# Patient Record
Sex: Male | Born: 1940 | Race: Black or African American | Hispanic: No | Marital: Married | Smoking: Former smoker
Health system: Southern US, Community
[De-identification: ages and names within clinical notes are randomized; demographics above are authoritative.]

## PROBLEM LIST (undated history)

## (undated) DIAGNOSIS — C642 Malignant neoplasm of left kidney, except renal pelvis: Secondary | ICD-10-CM

## (undated) DIAGNOSIS — Z8719 Personal history of other diseases of the digestive system: Secondary | ICD-10-CM

## (undated) DIAGNOSIS — I2510 Atherosclerotic heart disease of native coronary artery without angina pectoris: Secondary | ICD-10-CM

## (undated) DIAGNOSIS — I10 Essential (primary) hypertension: Secondary | ICD-10-CM

## (undated) DIAGNOSIS — K219 Gastro-esophageal reflux disease without esophagitis: Secondary | ICD-10-CM

## (undated) DIAGNOSIS — J449 Chronic obstructive pulmonary disease, unspecified: Secondary | ICD-10-CM

## (undated) DIAGNOSIS — Z95810 Presence of automatic (implantable) cardiac defibrillator: Secondary | ICD-10-CM

## (undated) DIAGNOSIS — N189 Chronic kidney disease, unspecified: Secondary | ICD-10-CM

## (undated) DIAGNOSIS — I82409 Acute embolism and thrombosis of unspecified deep veins of unspecified lower extremity: Secondary | ICD-10-CM

## (undated) DIAGNOSIS — I509 Heart failure, unspecified: Secondary | ICD-10-CM

---

## 1979-08-05 HISTORY — PX: CARDIAC CATHETERIZATION: SHX172

## 1981-12-04 DIAGNOSIS — I82409 Acute embolism and thrombosis of unspecified deep veins of unspecified lower extremity: Secondary | ICD-10-CM

## 1981-12-04 HISTORY — DX: Acute embolism and thrombosis of unspecified deep veins of unspecified lower extremity: I82.409

## 2010-07-21 HISTORY — PX: IMPLANTABLE CARDIOVERTER DEFIBRILLATOR IMPLANT: SHX5860

## 2014-04-09 DIAGNOSIS — N289 Disorder of kidney and ureter, unspecified: Secondary | ICD-10-CM | POA: Insufficient documentation

## 2014-05-04 ENCOUNTER — Other Ambulatory Visit: Payer: Self-pay | Admitting: Urology

## 2014-05-04 DIAGNOSIS — N289 Disorder of kidney and ureter, unspecified: Secondary | ICD-10-CM

## 2014-05-04 HISTORY — PX: RENAL CRYOABLATION: SHX2322

## 2014-05-07 ENCOUNTER — Ambulatory Visit
Admission: RE | Admit: 2014-05-07 | Discharge: 2014-05-07 | Disposition: A | Discharge status: Home / Self Care | Payer: Medicare Other | Source: Ambulatory Visit | Attending: Urology | Admitting: Urology

## 2014-05-07 ENCOUNTER — Encounter (INDEPENDENT_AMBULATORY_CARE_PROVIDER_SITE_OTHER): Payer: Self-pay

## 2014-05-07 DIAGNOSIS — N289 Disorder of kidney and ureter, unspecified: Secondary | ICD-10-CM

## 2014-05-11 ENCOUNTER — Telehealth: Payer: Self-pay | Admitting: Emergency Medicine

## 2014-05-11 NOTE — Telephone Encounter (Signed)
LM W/ AMY AT DR ALI AKBARY OFFICE TO GET CARDIAC CLEARANCE PRIOR TO RENAL CRYOABLATION TO BE PERFORMED AT [facility].  WILL CK AND CALL BACK.

## 2014-06-22 ENCOUNTER — Other Ambulatory Visit: Payer: Self-pay | Admitting: Radiology

## 2014-06-22 DIAGNOSIS — C642 Malignant neoplasm of left kidney, except renal pelvis: Secondary | ICD-10-CM

## 2014-07-02 ENCOUNTER — Other Ambulatory Visit: Payer: Self-pay | Admitting: Emergency Medicine

## 2014-07-02 DIAGNOSIS — C642 Malignant neoplasm of left kidney, except renal pelvis: Secondary | ICD-10-CM

## 2014-07-15 ENCOUNTER — Other Ambulatory Visit: Payer: Self-pay | Admitting: Radiology

## 2014-07-29 DIAGNOSIS — I429 Cardiomyopathy, unspecified: Secondary | ICD-10-CM | POA: Insufficient documentation

## 2014-07-29 DIAGNOSIS — H25819 Combined forms of age-related cataract, unspecified eye: Secondary | ICD-10-CM | POA: Insufficient documentation

## 2014-07-29 DIAGNOSIS — I2510 Atherosclerotic heart disease of native coronary artery without angina pectoris: Secondary | ICD-10-CM | POA: Insufficient documentation

## 2014-07-29 DIAGNOSIS — F528 Other sexual dysfunction not due to a substance or known physiological condition: Secondary | ICD-10-CM | POA: Insufficient documentation

## 2014-07-29 DIAGNOSIS — R9431 Abnormal electrocardiogram [ECG] [EKG]: Secondary | ICD-10-CM | POA: Insufficient documentation

## 2014-07-29 DIAGNOSIS — R350 Frequency of micturition: Secondary | ICD-10-CM | POA: Insufficient documentation

## 2014-07-29 DIAGNOSIS — R102 Pelvic and perineal pain: Secondary | ICD-10-CM | POA: Insufficient documentation

## 2014-07-29 DIAGNOSIS — H179 Unspecified corneal scar and opacity: Secondary | ICD-10-CM | POA: Insufficient documentation

## 2014-07-29 DIAGNOSIS — R12 Heartburn: Secondary | ICD-10-CM | POA: Insufficient documentation

## 2014-08-05 ENCOUNTER — Ambulatory Visit
Admission: RE | Admit: 2014-08-05 | Discharge: 2014-08-05 | Disposition: A | Discharge status: Home / Self Care | Payer: Medicare Other | Source: Ambulatory Visit | Attending: Radiology | Admitting: Radiology

## 2014-08-05 DIAGNOSIS — C642 Malignant neoplasm of left kidney, except renal pelvis: Secondary | ICD-10-CM

## 2014-08-05 NOTE — Progress Notes (Signed)
Denies hematuria or any other problems with urination.  Denies pain associated with procedure.    Has returned to work.  Overall doing well.  Jd Mccaster Riki Rusk, RN 08/05/2014 4:03 PM

## 2014-09-04 DIAGNOSIS — R319 Hematuria, unspecified: Secondary | ICD-10-CM | POA: Insufficient documentation

## 2014-10-23 ENCOUNTER — Emergency Department (HOSPITAL_BASED_OUTPATIENT_CLINIC_OR_DEPARTMENT_OTHER)
Admission: EM | Admit: 2014-10-23 | Discharge: 2014-10-23 | Disposition: A | Discharge status: Other Acute Inpatient Hospital | Payer: Medicare Other | Attending: Emergency Medicine | Admitting: Emergency Medicine

## 2014-10-23 ENCOUNTER — Encounter (HOSPITAL_BASED_OUTPATIENT_CLINIC_OR_DEPARTMENT_OTHER): Payer: Self-pay

## 2014-10-23 ENCOUNTER — Emergency Department (HOSPITAL_BASED_OUTPATIENT_CLINIC_OR_DEPARTMENT_OTHER): Payer: Medicare Other

## 2014-10-23 DIAGNOSIS — R0609 Other forms of dyspnea: Secondary | ICD-10-CM | POA: Diagnosis not present

## 2014-10-23 DIAGNOSIS — N289 Disorder of kidney and ureter, unspecified: Secondary | ICD-10-CM

## 2014-10-23 DIAGNOSIS — Z87891 Personal history of nicotine dependence: Secondary | ICD-10-CM | POA: Diagnosis not present

## 2014-10-23 DIAGNOSIS — D696 Thrombocytopenia, unspecified: Secondary | ICD-10-CM

## 2014-10-23 DIAGNOSIS — I10 Essential (primary) hypertension: Secondary | ICD-10-CM | POA: Diagnosis not present

## 2014-10-23 DIAGNOSIS — R0602 Shortness of breath: Secondary | ICD-10-CM | POA: Diagnosis present

## 2014-10-23 DIAGNOSIS — I509 Heart failure, unspecified: Secondary | ICD-10-CM | POA: Diagnosis not present

## 2014-10-23 DIAGNOSIS — I2510 Atherosclerotic heart disease of native coronary artery without angina pectoris: Secondary | ICD-10-CM | POA: Insufficient documentation

## 2014-10-23 DIAGNOSIS — R079 Chest pain, unspecified: Secondary | ICD-10-CM | POA: Diagnosis not present

## 2014-10-23 DIAGNOSIS — Z95810 Presence of automatic (implantable) cardiac defibrillator: Secondary | ICD-10-CM | POA: Diagnosis not present

## 2014-10-23 HISTORY — DX: Atherosclerotic heart disease of native coronary artery without angina pectoris: I25.10

## 2014-10-23 HISTORY — DX: Essential (primary) hypertension: I10

## 2014-10-23 LAB — CBC WITH DIFFERENTIAL/PLATELET
BASOS ABS: 0 10*3/uL (ref 0.0–0.1)
Basophils Relative: 0 % (ref 0–1)
Eosinophils Absolute: 0.1 10*3/uL (ref 0.0–0.7)
Eosinophils Relative: 1 % (ref 0–5)
HEMATOCRIT: 39.6 % (ref 39.0–52.0)
Hemoglobin: 13.9 g/dL (ref 13.0–17.0)
Lymphocytes Relative: 38 % (ref 12–46)
Lymphs Abs: 2.1 10*3/uL (ref 0.7–4.0)
MCH: 28.3 pg (ref 26.0–34.0)
MCHC: 35.1 g/dL (ref 30.0–36.0)
MCV: 80.5 fL (ref 78.0–100.0)
Monocytes Absolute: 0.5 10*3/uL (ref 0.1–1.0)
Monocytes Relative: 10 % (ref 3–12)
NEUTROS ABS: 2.8 10*3/uL (ref 1.7–7.7)
Neutrophils Relative %: 51 % (ref 43–77)
PLATELETS: 129 10*3/uL — AB (ref 150–400)
RBC: 4.92 MIL/uL (ref 4.22–5.81)
RDW: 14.5 % (ref 11.5–15.5)
WBC: 5.5 10*3/uL (ref 4.0–10.5)

## 2014-10-23 LAB — COMPREHENSIVE METABOLIC PANEL
ALBUMIN: 3.6 g/dL (ref 3.5–5.2)
ALT: 62 U/L — AB (ref 0–53)
AST: 58 U/L — AB (ref 0–37)
Alkaline Phosphatase: 67 U/L (ref 39–117)
Anion gap: 14 (ref 5–15)
BILIRUBIN TOTAL: 0.8 mg/dL (ref 0.3–1.2)
BUN: 28 mg/dL — ABNORMAL HIGH (ref 6–23)
CHLORIDE: 106 meq/L (ref 96–112)
CO2: 23 meq/L (ref 19–32)
Calcium: 9.5 mg/dL (ref 8.4–10.5)
Creatinine, Ser: 1.4 mg/dL — ABNORMAL HIGH (ref 0.50–1.35)
GFR calc Af Amer: 56 mL/min — ABNORMAL LOW (ref >90)
GFR, EST NON AFRICAN AMERICAN: 48 mL/min — AB (ref >90)
Glucose, Bld: 118 mg/dL — ABNORMAL HIGH (ref 70–99)
Potassium: 4.1 mEq/L (ref 3.7–5.3)
SODIUM: 143 meq/L (ref 137–147)
Total Protein: 7 g/dL (ref 6.0–8.3)

## 2014-10-23 LAB — TROPONIN I
Troponin I: <0.3 ng/mL (ref <0.30)
Troponin I: <0.3 ng/mL (ref <0.30)

## 2014-10-23 LAB — PRO B NATRIURETIC PEPTIDE: PRO B NATRI PEPTIDE: 4646 pg/mL — AB (ref 0–125)

## 2014-10-23 MED ORDER — FUROSEMIDE 10 MG/ML IJ SOLN
40.0000 mg | Freq: Once | INTRAMUSCULAR | Status: AC
  Administered 2014-10-23: 40 mg via INTRAVENOUS
  Filled 2014-10-23: qty 4

## 2014-10-23 MED ORDER — ASPIRIN 81 MG PO CHEW
324.0000 mg | CHEWABLE_TABLET | Freq: Once | ORAL | Status: AC
  Administered 2014-10-23: 324 mg via ORAL
  Filled 2014-10-23: qty 4

## 2014-10-23 NOTE — ED Notes (Signed)
HP1 at bedside for pt transport to [facility], pt care transferred to [facility] staff. Pt is awake and alert, denies any c/o.

## 2014-10-23 NOTE — ED Provider Notes (Signed)
Pt awaiting transfer He has been stable He has had significant urine output He is feeling improved   Sharyon Cable, MD 10/23/14 [phone]

## 2014-10-23 NOTE — ED Notes (Signed)
I spoke with Tammy, Nurse Supervisor at [facility], states they have no tele beds at this time will call when one is available. Pt notified of delay, pt resting, no distress noted, pt NSR on monitor.

## 2014-10-23 NOTE — ED Provider Notes (Signed)
CSN: [id number]     Arrival date & time 10/23/14  [id number] History   First MD Initiated Contact with Patient 10/23/14 0245     Chief Complaint  Patient presents with  . Shortness of Breath     Patient is a 73 y.o. male presenting with shortness of breath. The history is provided by the patient.  Shortness of Breath Severity:  Moderate Onset quality:  Gradual Timing:  Intermittent Progression:  Unchanged Chronicity:  New Relieved by:  Rest Worsened by:  Activity Associated symptoms: chest pain and cough   Associated symptoms: no fever, no hemoptysis and no vomiting   Patient reports he woke up tonight feeling SOB He reports he has had these episodes intermittently over the past week He reports increased fatigue and dyspnea on exertion  He reports 2 evaluations in the [facility] over past week.  He reports all testing was normal.  He also has seen his Cardiologist in [facility] this week as well He has an ICD in place and no shocks delivered  He reports very mild CP over past several days.  He does not have any active CP at this time   Past Medical History  Diagnosis Date  . Coronary artery disease   . Hypertension    Past Surgical History  Procedure Laterality Date  . Implantable cardioverter defibrillator implant     No family history on file. History  Substance Use Topics  . Smoking status: Former Smoker -- 0.50 packs/day    Types: Cigarettes    Start date: 05/08/1959    Quit date: 05/07/1976  . Smokeless tobacco: Not on file  . Alcohol Use: No    Review of Systems  Constitutional: Negative for fever.  Respiratory: Positive for cough and shortness of breath. Negative for hemoptysis.   Cardiovascular: Positive for chest pain.  Gastrointestinal: Negative for vomiting and diarrhea.  All other systems reviewed and are negative.     Allergies  Review of patient's allergies indicates no known allergies.  Home Medications   Prior to Admission medications    Not on File   BP 125/76 mmHg  Pulse 80  Temp(Src) 98.4 F (36.9 C) (Oral)  Resp 20  Ht 6' 1.5" (1.867 m)  Wt 222 lb (100.699 kg)  BMI 28.89 kg/m2  SpO2 98% Physical Exam CONSTITUTIONAL: Well developed/well nourished HEAD: Normocephalic/atraumatic EYES: EOMI/PERRL ENMT: Mucous membranes moist NECK: supple no meningeal signs SPINE/BACK:entire spine nontender CV: S1/S2 noted, no murmurs/rubs/gallops noted LUNGS: Lungs are clear to auscultation bilaterally, no apparent distress ABDOMEN: soft, nontender, no rebound or guarding, bowel sounds noted throughout abdomen GU:no cva tenderness NEURO: Pt is awake/alert/appropriate, moves all extremitiesx4.  No facial droop.   EXTREMITIES: pulses normal/equal, full ROM. Minimal pitting edema to bilateral LE SKIN: warm, color normal PSYCH: no abnormalities of mood noted, alert and oriented to situation  ED Course  Procedures   3:03 AM Will need to obtain records from outside facility  Pt currently stable at this time 3:49 AM Records from [facility] received Pt with recent ED visit on 11/15 EKG is unchanged His BNP is elevated  While in the ED, patient ambulated and minimal effort he became tachycardic/tachypneic but no hypoxia His cardiologist is Dr Elvis Coil.  Will call for admission 4:02 AM D/w Dr Atilano Median, physician for Cardiology He agrees that patient should be admitted to history/exam/symptoms Pt agreeable Will admit to [facility] 4:30 AM D/w Dr. Lunette Stands with [facility] Will admit to [facility] regional Labs Review  Labs Reviewed  CBC WITH DIFFERENTIAL - Abnormal; Notable for the following:    Platelets 129 (*)    All other components within normal limits  COMPREHENSIVE METABOLIC PANEL - Abnormal; Notable for the following:    Glucose, Bld 118 (*)    BUN 28 (*)    Creatinine, Ser 1.40 (*)    AST 58 (*)    ALT 62 (*)    GFR calc non Af Amer 48 (*)    GFR calc Af Amer 56 (*)    All other components within  normal limits  PRO B NATRIURETIC PEPTIDE - Abnormal; Notable for the following:    Pro B Natriuretic peptide (BNP) 4646.0 (*)    All other components within normal limits  TROPONIN I    Imaging Review Dg Chest 2 View  10/23/2014   CLINICAL DATA:  Shortness of breath and mid chest pain for 1 week. History of coronary artery disease and hypertension.  EXAM: CHEST  2 VIEW  COMPARISON:  Chest radiograph October 18, 2014  FINDINGS: The cardiac silhouette appears moderate to severely enlarged, unchanged. Mild central pulmonary vascular congestion. Mildly calcified aorta, mediastinal silhouette is nonsuspicious. No pleural effusions or focal consolidations. LEFT cardiac defibrillator in situ. Soft tissue planes and included osseous structures are nonsuspicious, mild degenerative change of the thoracic spine.  IMPRESSION: Stable cardiomegaly mild pulmonary vascular congestion without acute pulmonary process.   Electronically Signed   By: Elon Alas   On: 10/23/2014 03:22     EKG Interpretation   Date/Time:  Friday October 23 2014 02:48:24 EST Ventricular Rate:  74 PR Interval:    QRS Duration: 234 QT Interval:  548 QTC Calculation: 608 R Axis:   -86 Text Interpretation:  Ventricular-paced rhythm Biventricular pacemaker  detected Abnormal ECG No previous ECGs available Confirmed by [place] ([id number]) on 10/23/2014 2:52:30 AM      MDM   Final diagnoses:  Acute congestive heart failure, unspecified congestive heart failure type  Renal insufficiency  Thrombocytopenia  Dyspnea on exertion    Nursing notes including past medical history and social history reviewed and considered in documentation. Labs/vital reviewed myself and considered during evaluation xrays/imaging reviewed by myself and considered during evaluation Previous records reviewed and considered     Sharyon Cable, MD 10/23/14 0430

## 2014-10-23 NOTE — ED Notes (Signed)
Pt c/o SOB, states worse laying down, states has been seen twice for same in the past week with no relief; pt speaking in complete sentences, no distress noted

## 2014-10-23 NOTE — ED Notes (Signed)
Pt on stretcher awaiting transport to [facility] by HP1.

## 2014-10-23 NOTE — ED Notes (Signed)
Pt placed on heart monitor.

## 2014-12-03 ENCOUNTER — Other Ambulatory Visit (HOSPITAL_COMMUNITY): Payer: Self-pay | Admitting: Interventional Radiology

## 2014-12-03 DIAGNOSIS — C642 Malignant neoplasm of left kidney, except renal pelvis: Secondary | ICD-10-CM

## 2014-12-23 ENCOUNTER — Ambulatory Visit
Admission: RE | Admit: 2014-12-23 | Discharge: 2014-12-23 | Disposition: A | Discharge status: Home / Self Care | Payer: Medicare Other | Source: Ambulatory Visit | Attending: Interventional Radiology | Admitting: Interventional Radiology

## 2014-12-23 DIAGNOSIS — C642 Malignant neoplasm of left kidney, except renal pelvis: Secondary | ICD-10-CM

## 2014-12-23 HISTORY — PX: IR GENERIC HISTORICAL: IMG1180011

## 2014-12-23 NOTE — Progress Notes (Signed)
Chief Complaint: Chief Complaint  Patient presents with  . 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 Prior postprocedural CT imaging was performed on 08/03/2014 demonstrating an adequate ablation defect with no residual enhancing carcinoma identified. The patient has been doing well. He states that he did have one episode of fluid overload last November which responded to additional diuretic administration. He has remained active and is still working part time [employer]. He denies any symptoms related to ablation. He has had no pain or urinary symptoms.  Past Medical History  Diagnosis Date  . Coronary artery disease   . Hypertension     Past Surgical History  Procedure Laterality Date  . Implantable cardioverter defibrillator implant      Allergies: Review of patient's allergies indicates no known allergies.  Medications: Prior to Admission medications   Not on File    No family history on file.  History   Social History  . Marital Status: Married    Spouse Name: N/A    Number of Children: N/A  . Years of Education: N/A   Social History Main Topics  . Smoking status: Former Smoker -- 0.50 packs/day    Types: Cigarettes    Start date: 05/08/1959    Quit date: 05/07/1976  . Smokeless tobacco: Not on file  . Alcohol Use: No  . Drug Use: Not on file  . Sexual Activity: Not on file   Other Topics Concern  . Not on file   Social History Narrative    Review of Systems: A 12 point ROS discussed and pertinent positives are indicated in the HPI above.  All other systems are negative.  Review of Systems  Constitutional: Negative.   Respiratory: Negative.   Cardiovascular: Negative.   Gastrointestinal: Negative.   Genitourinary: Negative.   Neurological: Negative.        Vital Signs: BP 109/62 mmHg  Pulse 65  Temp(Src) 98 F (36.7 C) (Oral)  Resp 14  SpO2 100%  Physical Exam  Constitutional: He is oriented to person, place, and time. He appears well-developed and well-nourished. No distress.  Abdominal: Soft. He exhibits no distension. There is no tenderness.  Neurological: He is alert and oriented to person, place, and time.  Skin: He is not diaphoretic.  Nursing note and vitals reviewed.   Imaging: No results found.  Labs:  CBC:  Recent Labs  10/23/14 0250  WBC 5.5  HGB 13.9  HCT 39.6  PLT 129*    COAGS: No results for input(s): INR, APTT in the last 8760 hours.  BMP:  Recent Labs  10/23/14 0250  NA 143  K 4.1  CL 106  CO2 23  GLUCOSE 118*  BUN 28*  CALCIUM 9.5  CREATININE 1.40*  GFRNONAA 48*  GFRAA 56*   12/16/14:  BUN 27, Cr 1.45 and GFR 54 mL/min  LIVER FUNCTION TESTS:  Recent Labs  10/23/14 0250  BILITOT 0.8  AST 58*  ALT 62*  ALKPHOS 67  PROT 7.0  ALBUMIN 3.6    TUMOR MARKERS: No results for input(s): AFPTM, CEA, CA199, CHROMGRNA in the last 8760 hours.  Assessment and Plan:  CT performed today demonstrates further retraction of a cryoablation defect of the posterior mid left kidney with no evidence of residual enhancing tumor. No complication is  evident. There is development of mild renal insufficiency which was not present in August. The last 2 creatinine levels have been around 1.4. BUN has been elevated as well, suggesting some potential prerenal component. Since the patient got contrast today for CT, I recommended that he drink some additional water today. Renal function will be followed. I have recommended further follow-up CT imaging in July, 1 year post ablation. The patient is agreeable.  I spent a total of 20 minutes face to face in clinical consultation, greater than 50% of which was counseling/coordinating care for ablation of a left renal carcinoma.  Venetia Night. Kathlene Cote, M.D. Pager:   [phone]     Signed: [name] T 12/23/2014, 1:00 PM

## 2014-12-23 NOTE — Progress Notes (Signed)
Denies hematuria or any other problems with urination.  Denies pain associated with cryoablation.  To follow up with Dr Nevada Crane in the next few weeks.  Brylinn Teaney Swanton, RN 12/23/2014 11:13 AM

## 2015-01-19 DIAGNOSIS — C649 Malignant neoplasm of unspecified kidney, except renal pelvis: Secondary | ICD-10-CM | POA: Insufficient documentation

## 2015-03-04 ENCOUNTER — Emergency Department (HOSPITAL_BASED_OUTPATIENT_CLINIC_OR_DEPARTMENT_OTHER)
Admission: EM | Admit: 2015-03-04 | Discharge: 2015-03-04 | Disposition: A | Discharge status: Home / Self Care | Payer: Medicare Other | Attending: Emergency Medicine | Admitting: Emergency Medicine

## 2015-03-04 ENCOUNTER — Emergency Department (HOSPITAL_BASED_OUTPATIENT_CLINIC_OR_DEPARTMENT_OTHER): Payer: Medicare Other

## 2015-03-04 ENCOUNTER — Encounter (HOSPITAL_BASED_OUTPATIENT_CLINIC_OR_DEPARTMENT_OTHER): Payer: Self-pay

## 2015-03-04 DIAGNOSIS — I509 Heart failure, unspecified: Secondary | ICD-10-CM | POA: Diagnosis not present

## 2015-03-04 DIAGNOSIS — Z87891 Personal history of nicotine dependence: Secondary | ICD-10-CM | POA: Diagnosis not present

## 2015-03-04 DIAGNOSIS — R05 Cough: Secondary | ICD-10-CM | POA: Insufficient documentation

## 2015-03-04 DIAGNOSIS — Z950 Presence of cardiac pacemaker: Secondary | ICD-10-CM | POA: Insufficient documentation

## 2015-03-04 DIAGNOSIS — Z79899 Other long term (current) drug therapy: Secondary | ICD-10-CM | POA: Diagnosis not present

## 2015-03-04 DIAGNOSIS — R0602 Shortness of breath: Secondary | ICD-10-CM | POA: Diagnosis present

## 2015-03-04 DIAGNOSIS — I10 Essential (primary) hypertension: Secondary | ICD-10-CM | POA: Insufficient documentation

## 2015-03-04 DIAGNOSIS — I2510 Atherosclerotic heart disease of native coronary artery without angina pectoris: Secondary | ICD-10-CM | POA: Insufficient documentation

## 2015-03-04 DIAGNOSIS — Z7901 Long term (current) use of anticoagulants: Secondary | ICD-10-CM | POA: Diagnosis not present

## 2015-03-04 LAB — CBC WITH DIFFERENTIAL/PLATELET
BASOS PCT: 0 % (ref 0–1)
Basophils Absolute: 0 10*3/uL (ref 0.0–0.1)
Eosinophils Absolute: 0 10*3/uL (ref 0.0–0.7)
Eosinophils Relative: 1 % (ref 0–5)
HEMATOCRIT: 39.5 % (ref 39.0–52.0)
HEMOGLOBIN: 13.7 g/dL (ref 13.0–17.0)
Lymphocytes Relative: 37 % (ref 12–46)
Lymphs Abs: 1.5 10*3/uL (ref 0.7–4.0)
MCH: 28.4 pg (ref 26.0–34.0)
MCHC: 34.7 g/dL (ref 30.0–36.0)
MCV: 81.8 fL (ref 78.0–100.0)
MONOS PCT: 13 % — AB (ref 3–12)
Monocytes Absolute: 0.5 10*3/uL (ref 0.1–1.0)
NEUTROS PCT: 49 % (ref 43–77)
Neutro Abs: 2 10*3/uL (ref 1.7–7.7)
Platelets: 135 10*3/uL — ABNORMAL LOW (ref 150–400)
RBC: 4.83 MIL/uL (ref 4.22–5.81)
RDW: 15.5 % (ref 11.5–15.5)
WBC: 4.1 10*3/uL (ref 4.0–10.5)

## 2015-03-04 LAB — COMPREHENSIVE METABOLIC PANEL
ALK PHOS: 61 U/L (ref 39–117)
ALT: 40 U/L (ref 0–53)
ANION GAP: 6 (ref 5–15)
AST: 50 U/L — ABNORMAL HIGH (ref 0–37)
Albumin: 3.7 g/dL (ref 3.5–5.2)
BUN: 29 mg/dL — ABNORMAL HIGH (ref 6–23)
CO2: 24 mmol/L (ref 19–32)
Calcium: 8.8 mg/dL (ref 8.4–10.5)
Chloride: 111 mmol/L (ref 96–112)
Creatinine, Ser: 1.3 mg/dL (ref 0.50–1.35)
GFR calc Af Amer: 61 mL/min — ABNORMAL LOW (ref >90)
GFR calc non Af Amer: 53 mL/min — ABNORMAL LOW (ref >90)
GLUCOSE: 133 mg/dL — AB (ref 70–99)
Potassium: 4.1 mmol/L (ref 3.5–5.1)
Sodium: 141 mmol/L (ref 135–145)
Total Bilirubin: 0.6 mg/dL (ref 0.3–1.2)
Total Protein: 6.5 g/dL (ref 6.0–8.3)

## 2015-03-04 LAB — TROPONIN I
Troponin I: 0.06 ng/mL — ABNORMAL HIGH (ref <0.031)
Troponin I: 0.09 ng/mL — ABNORMAL HIGH (ref <0.031)

## 2015-03-04 LAB — BRAIN NATRIURETIC PEPTIDE: B Natriuretic Peptide: 1490.3 pg/mL — ABNORMAL HIGH (ref 0.0–100.0)

## 2015-03-04 MED ORDER — FUROSEMIDE 10 MG/ML IJ SOLN
40.0000 mg | Freq: Once | INTRAMUSCULAR | Status: AC
  Administered 2015-03-04: 40 mg via INTRAVENOUS
  Filled 2015-03-04: qty 4

## 2015-03-04 NOTE — ED Notes (Signed)
Pt reports SHOB with exertion x 2-3 days. Reports cough, dry and non-productive

## 2015-03-04 NOTE — ED Notes (Signed)
Pt denies sob or pain at present

## 2015-03-04 NOTE — ED Provider Notes (Signed)
CSN: [id number]     Arrival date & time 03/04/15  1826 History  This chart was scribed for Spencer Speak, MD by Tula Nakayama, ED Scribe. This patient was seen in room MH06/MH06 and the patient's care was started at 6:43 PM.    Chief Complaint  Patient presents with  . Shortness of Breath   Patient is a 74 y.o. male presenting with shortness of breath. The history is provided by the patient. No language interpreter was used.  Shortness of Breath Severity:  Mild Onset quality:  Gradual Duration:  3 days Timing:  Intermittent Progression:  Unchanged Chronicity:  New Context: activity   Relieved by:  Rest Worsened by:  Exertion Ineffective treatments:  Diuretics Associated symptoms: cough   Associated symptoms: no chest pain     HPI Comments: Daric Paslay is a 74 y.o. male with a history of CAD and HTN, who presents to the Emergency Department complaining of intermittent, mild SOB that occurs with exertion and started 2-3 days ago. Pt states mild, dry and non-productive cough as an associated symptom. He reports SOB improves with rest. Pt had a pacemaker placed in 2011. He was seen in the ED in 10/2014 for SOB and was diagnosed with acute CHF. He denies a history of kidney issues. Pt also denies CP and leg swelling as an associated symptoms.  Past Medical History  Diagnosis Date  . Coronary artery disease   . Hypertension    Past Surgical History  Procedure Laterality Date  . Implantable cardioverter defibrillator implant     No family history on file. History  Substance Use Topics  . Smoking status: Former Smoker -- 0.50 packs/day    Types: Cigarettes    Start date: 05/08/1959    Quit date: 05/07/1976  . Smokeless tobacco: Not on file  . Alcohol Use: No    Review of Systems  Respiratory: Positive for cough and shortness of breath.   Cardiovascular: Negative for chest pain.  All other systems reviewed and are negative.   Allergies  Review of patient's allergies  indicates no known allergies.  Home Medications   Prior to Admission medications   Medication Sig Start Date End Date Taking? Authorizing Provider  diltiazem (CARDIZEM) 30 MG tablet Take 30 mg by mouth 4 (four) times daily.   Yes Historical Provider, MD  furosemide (LASIX) 20 MG tablet Take 20 mg by mouth.   Yes Historical Provider, MD  warfarin (COUMADIN) 5 MG tablet Take 5 mg by mouth daily.   Yes Historical Provider, MD   BP 102/62 mmHg  Pulse 74  Temp(Src) 98.1 F (36.7 C) (Oral)  Resp 18  Ht 6\' 1"  (1.854 m)  Wt 218 lb (98.884 kg)  BMI 28.77 kg/m2  SpO2 97% Physical Exam  Constitutional: He appears well-developed and well-nourished. No distress.  HENT:  Head: Normocephalic and atraumatic.  Eyes: Conjunctivae and EOM are normal.  Neck: Neck supple. No tracheal deviation present.  Cardiovascular: Normal rate and regular rhythm.   Pulmonary/Chest: Effort normal. No respiratory distress. He has rales.  Slight rales at the bases bilaterally  Skin: Skin is warm and dry.  Psychiatric: He has a normal mood and affect. His behavior is normal.  Nursing note and vitals reviewed.   ED Course  Procedures   DIAGNOSTIC STUDIES: Oxygen Saturation is 97% on RA, normal by my interpretation.    COORDINATION OF CARE: 6:50 PM Discussed treatment plan with pt at bedside and pt agreed to plan.    Labs Review  Labs Reviewed - No data to display  Imaging Review No results found.   EKG Interpretation   Date/Time:  Thursday March 04 2015 18:45:28 EDT Ventricular Rate:  77 PR Interval:  140 QRS Duration: 152 QT Interval:  468 QTC Calculation: 529 R Axis:   -82 Text Interpretation:  Atrial-sensed ventricular-paced rhythm with  occasional Premature ventricular complexes Abnormal ECG Confirmed by DELOS   MD, Fain Francis ([id number]) on 03/04/2015 6:53:22 PM      MDM   Final diagnoses:  None    Patient with history of congestive heart failure and pacemaker placement. He presents  today with complaints of difficulty breathing. He has had exacerbations of CHF in the past which have been treated with Lasix. He believes that is what is happening again.  He does have crackles on exam and is found to have an elevated BNP. His chest x-ray shows cardiomegaly without overt pulmonary edema. He was given IV Lasix with good diuresis and is feeling much improved. During the course of his workup he was found to have a troponin of 0.06. I am uncertain as to the exact significance of this, however feel it is likely related to congestive heart failure rather than an acute coronary event. His repeat troponin is 0.09. I've discussed these findings with Dr. Philbert Riser from cardiology who does not feel as though this is a significant increase and agrees that his presentation is most consistent with congestive heart failure. He will be discharged to home with an increased dose of Lasix for the next 3 days and advised to return to the ER if he develops worsening breathing, chest pain, or other problems.  I personally performed the services described in this documentation, which was scribed in my presence. The recorded information has been reviewed and is accurate.      Spencer Speak, MD 03/04/15 [phone]

## 2015-03-04 NOTE — ED Notes (Signed)
MD at bedside. 

## 2015-03-04 NOTE — Discharge Instructions (Signed)
Increase your Lasix to 20 mg twice daily for the next 3 days, then resume 20 mg daily dose as before.  Return to the emergency department if you develop chest pains, worsening breathing, or other new and concerning symptoms.   Heart Failure Heart failure is a condition in which the heart has trouble pumping blood. This means your heart does not pump blood efficiently for your body to work well. In some cases of heart failure, fluid may back up into your lungs or you may have swelling (edema) in your lower legs. Heart failure is usually a long-term (chronic) condition. It is important for you to take good care of yourself and follow your health care provider's treatment plan. CAUSES  Some health conditions can cause heart failure. Those health conditions include:  High blood pressure (hypertension). Hypertension causes the heart muscle to work harder than normal. When pressure in the blood vessels is high, the heart needs to pump (contract) with more force in order to circulate blood throughout the body. High blood pressure eventually causes the heart to become stiff and weak.  Coronary artery disease (CAD). CAD is the buildup of cholesterol and fat (plaque) in the arteries of the heart. The blockage in the arteries deprives the heart muscle of oxygen and blood. This can cause chest pain and may lead to a heart attack. High blood pressure can also contribute to CAD.  Heart attack (myocardial infarction). A heart attack occurs when one or more arteries in the heart become blocked. The loss of oxygen damages the muscle tissue of the heart. When this happens, part of the heart muscle dies. The injured tissue does not contract as well and weakens the heart's ability to pump blood.  Abnormal heart valves. When the heart valves do not open and close properly, it can cause heart failure. This makes the heart muscle pump harder to keep the blood flowing.  Heart muscle disease (cardiomyopathy or  myocarditis). Heart muscle disease is damage to the heart muscle from a variety of causes. These can include drug or alcohol abuse, infections, or unknown reasons. These can increase the risk of heart failure.  Lung disease. Lung disease makes the heart work harder because the lungs do not work properly. This can cause a strain on the heart, leading it to fail.  Diabetes. Diabetes increases the risk of heart failure. High blood sugar contributes to high fat (lipid) levels in the blood. Diabetes can also cause slow damage to tiny blood vessels that carry important nutrients to the heart muscle. When the heart does not get enough oxygen and food, it can cause the heart to become weak and stiff. This leads to a heart that does not contract efficiently.  Other conditions can contribute to heart failure. These include abnormal heart rhythms, thyroid problems, and low blood counts (anemia). Certain unhealthy behaviors can increase the risk of heart failure, including:  Being overweight.  Smoking or chewing tobacco.  Eating foods high in fat and cholesterol.  Abusing illicit drugs or alcohol.  Lacking physical activity. SYMPTOMS  Heart failure symptoms may vary and can be hard to detect. Symptoms may include:  Shortness of breath with activity, such as climbing stairs.  Persistent cough.  Swelling of the feet, ankles, legs, or abdomen.  Unexplained weight gain.  Difficulty breathing when lying flat (orthopnea).  Waking from sleep because of the need to sit up and get more air.  Rapid heartbeat.  Fatigue and loss of energy.  Feeling light-headed, dizzy, or  close to fainting.  Loss of appetite.  Nausea.  Increased urination during the night (nocturia). DIAGNOSIS  A diagnosis of heart failure is based on your history, symptoms, physical examination, and diagnostic tests. Diagnostic tests for heart failure may include:  Echocardiography.  Electrocardiography.  Chest  X-ray.  Blood tests.  Exercise stress test.  Cardiac angiography.  Radionuclide scans. TREATMENT  Treatment is aimed at managing the symptoms of heart failure. Medicines, behavioral changes, or surgical intervention may be necessary to treat heart failure.  Medicines to help treat heart failure may include:  Angiotensin-converting enzyme (ACE) inhibitors. This type of medicine blocks the effects of a blood protein called angiotensin-converting enzyme. ACE inhibitors relax (dilate) the blood vessels and help lower blood pressure.  Angiotensin receptor blockers (ARBs). This type of medicine blocks the actions of a blood protein called angiotensin. Angiotensin receptor blockers dilate the blood vessels and help lower blood pressure.  Water pills (diuretics). Diuretics cause the kidneys to remove salt and water from the blood. The extra fluid is removed through urination. This loss of extra fluid lowers the volume of blood the heart pumps.  Beta blockers. These prevent the heart from beating too fast and improve heart muscle strength.  Digitalis. This increases the force of the heartbeat.  Healthy behavior changes include:  Obtaining and maintaining a healthy weight.  Stopping smoking or chewing tobacco.  Eating heart-healthy foods.  Limiting or avoiding alcohol.  Stopping illicit drug use.  Physical activity as directed by your health care provider.  Surgical treatment for heart failure may include:  A procedure to open blocked arteries, repair damaged heart valves, or remove damaged heart muscle tissue.  A pacemaker to improve heart muscle function and control certain abnormal heart rhythms.  An internal cardioverter defibrillator to treat certain serious abnormal heart rhythms.  A left ventricular assist device (LVAD) to assist the pumping ability of the heart. HOME CARE INSTRUCTIONS   Take medicines only as directed by your health care provider. Medicines are  important in reducing the workload of your heart, slowing the progression of heart failure, and improving your symptoms.  Do not stop taking your medicine unless directed by your health care provider.  Do not skip any dose of medicine.  Refill your prescriptions before you run out of medicine. Your medicines are needed every day.  Engage in moderate physical activity if directed by your health care provider. Moderate physical activity can benefit some people. The elderly and people with severe heart failure should consult with a health care provider for physical activity recommendations.  Eat heart-healthy foods. Food choices should be free of trans fat and low in saturated fat, cholesterol, and salt (sodium). Healthy choices include fresh or frozen fruits and vegetables, fish, lean meats, legumes, fat-free or low-fat dairy products, and whole grain or high fiber foods. Talk to a dietitian to learn more about heart-healthy foods.  Limit sodium if directed by your health care provider. Sodium restriction may reduce symptoms of heart failure in some people. Talk to a dietitian to learn more about heart-healthy seasonings.  Use healthy cooking methods. Healthy cooking methods include roasting, grilling, broiling, baking, poaching, steaming, or stir-frying. Talk to a dietitian to learn more about healthy cooking methods.  Limit fluids if directed by your health care provider. Fluid restriction may reduce symptoms of heart failure in some people.  Weigh yourself every day. Daily weights are important in the early recognition of excess fluid. You should weigh yourself every morning after you urinate  and before you eat breakfast. Wear the same amount of clothing each time you weigh yourself. Record your daily weight. Provide your health care provider with your weight record.  Monitor and record your blood pressure if directed by your health care provider.  Check your pulse if directed by your health  care provider.  Lose weight if directed by your health care provider. Weight loss may reduce symptoms of heart failure in some people.  Stop smoking or chewing tobacco. Nicotine makes your heart work harder by causing your blood vessels to constrict. Do not use nicotine gum or patches before talking to your health care provider.  Keep all follow-up visits as directed by your health care provider. This is important.  Limit alcohol intake to no more than 1 drink per day for nonpregnant women and 2 drinks per day for men. One drink equals 12 ounces of beer, 5 ounces of wine, or 1 ounces of hard liquor. Drinking more than that is harmful to your heart. Tell your health care provider if you drink alcohol several times a week. Talk with your health care provider about whether alcohol is safe for you. If your heart has already been damaged by alcohol or you have severe heart failure, drinking alcohol should be stopped completely.  Stop illicit drug use.  Stay up-to-date with immunizations. It is especially important to prevent respiratory infections through current pneumococcal and influenza immunizations.  Manage other health conditions such as hypertension, diabetes, thyroid disease, or abnormal heart rhythms as directed by your health care provider.  Learn to manage stress.  Plan rest periods when fatigued.  Learn strategies to manage high temperatures. If the weather is extremely hot:  Avoid vigorous physical activity.  Use air conditioning or fans or seek a cooler location.  Avoid caffeine and alcohol.  Wear loose-fitting, lightweight, and light-colored clothing.  Learn strategies to manage cold temperatures. If the weather is extremely cold:  Avoid vigorous physical activity.  Layer clothes.  Wear mittens or gloves, a hat, and a scarf when going outside.  Avoid alcohol.  Obtain ongoing education and support as needed.  Participate in or seek rehabilitation as needed to  maintain or improve independence and quality of life. SEEK MEDICAL CARE IF:   Your weight increases by 03 lb/1.4 kg in 1 day or 05 lb/2.3 kg in a week.  You have increasing shortness of breath that is unusual for you.  You are unable to participate in your usual physical activities.  You tire easily.  You cough more than normal, especially with physical activity.  You have any or more swelling in areas such as your hands, feet, ankles, or abdomen.  You are unable to sleep because it is hard to breathe.  You feel like your heart is beating fast (palpitations).  You become dizzy or light-headed upon standing up. SEEK IMMEDIATE MEDICAL CARE IF:   You have difficulty breathing.  There is a change in mental status such as decreased alertness or difficulty with concentration.  You have a pain or discomfort in your chest.  You have an episode of fainting (syncope). MAKE SURE YOU:   Understand these instructions.  Will watch your condition.  Will get help right away if you are not doing well or get worse. Document Released: 11/20/2005 Document Revised: 04/06/2014 Document Reviewed: 12/20/2012 [facility] Patient Information 2015 [place], [place]. This information is not intended to replace advice given to you by your health care provider. Make sure you discuss any questions you have  with your health care provider. ° °

## 2015-03-04 NOTE — ED Notes (Signed)
Placed on pulse ox and cardiac monitoring.

## 2015-03-21 ENCOUNTER — Emergency Department (HOSPITAL_BASED_OUTPATIENT_CLINIC_OR_DEPARTMENT_OTHER)
Admission: EM | Admit: 2015-03-21 | Discharge: 2015-03-21 | Disposition: A | Discharge status: Home / Self Care | Payer: Medicare Other | Attending: Emergency Medicine | Admitting: Emergency Medicine

## 2015-03-21 ENCOUNTER — Emergency Department (HOSPITAL_BASED_OUTPATIENT_CLINIC_OR_DEPARTMENT_OTHER): Payer: Medicare Other

## 2015-03-21 ENCOUNTER — Encounter (HOSPITAL_BASED_OUTPATIENT_CLINIC_OR_DEPARTMENT_OTHER): Payer: Self-pay | Admitting: *Deleted

## 2015-03-21 DIAGNOSIS — Z87891 Personal history of nicotine dependence: Secondary | ICD-10-CM | POA: Insufficient documentation

## 2015-03-21 DIAGNOSIS — I2510 Atherosclerotic heart disease of native coronary artery without angina pectoris: Secondary | ICD-10-CM | POA: Diagnosis not present

## 2015-03-21 DIAGNOSIS — R0602 Shortness of breath: Secondary | ICD-10-CM | POA: Insufficient documentation

## 2015-03-21 DIAGNOSIS — Z79899 Other long term (current) drug therapy: Secondary | ICD-10-CM | POA: Insufficient documentation

## 2015-03-21 DIAGNOSIS — R059 Cough, unspecified: Secondary | ICD-10-CM

## 2015-03-21 DIAGNOSIS — I10 Essential (primary) hypertension: Secondary | ICD-10-CM | POA: Diagnosis not present

## 2015-03-21 DIAGNOSIS — R05 Cough: Secondary | ICD-10-CM | POA: Diagnosis not present

## 2015-03-21 DIAGNOSIS — Z7982 Long term (current) use of aspirin: Secondary | ICD-10-CM | POA: Diagnosis not present

## 2015-03-21 DIAGNOSIS — Z7901 Long term (current) use of anticoagulants: Secondary | ICD-10-CM | POA: Diagnosis not present

## 2015-03-21 LAB — COMPREHENSIVE METABOLIC PANEL
ALBUMIN: 3.7 g/dL (ref 3.5–5.2)
ALK PHOS: 65 U/L (ref 39–117)
ALT: 41 U/L (ref 0–53)
AST: 62 U/L — AB (ref 0–37)
Anion gap: 7 (ref 5–15)
BILIRUBIN TOTAL: 1.1 mg/dL (ref 0.3–1.2)
BUN: 26 mg/dL — ABNORMAL HIGH (ref 6–23)
CHLORIDE: 107 mmol/L (ref 96–112)
CO2: 25 mmol/L (ref 19–32)
CREATININE: 1.38 mg/dL — AB (ref 0.50–1.35)
Calcium: 8.7 mg/dL (ref 8.4–10.5)
GFR calc Af Amer: 57 mL/min — ABNORMAL LOW (ref >90)
GFR calc non Af Amer: 49 mL/min — ABNORMAL LOW (ref >90)
Glucose, Bld: 164 mg/dL — ABNORMAL HIGH (ref 70–99)
POTASSIUM: 5 mmol/L (ref 3.5–5.1)
Sodium: 139 mmol/L (ref 135–145)
Total Protein: 6.6 g/dL (ref 6.0–8.3)

## 2015-03-21 LAB — CBC WITH DIFFERENTIAL/PLATELET
Basophils Absolute: 0 10*3/uL (ref 0.0–0.1)
Basophils Relative: 0 % (ref 0–1)
EOS ABS: 0.1 10*3/uL (ref 0.0–0.7)
EOS PCT: 1 % (ref 0–5)
HCT: 40.8 % (ref 39.0–52.0)
HEMOGLOBIN: 13.7 g/dL (ref 13.0–17.0)
LYMPHS ABS: 1 10*3/uL (ref 0.7–4.0)
Lymphocytes Relative: 26 % (ref 12–46)
MCH: 28 pg (ref 26.0–34.0)
MCHC: 33.6 g/dL (ref 30.0–36.0)
MCV: 83.3 fL (ref 78.0–100.0)
MONOS PCT: 12 % (ref 3–12)
Monocytes Absolute: 0.5 10*3/uL (ref 0.1–1.0)
NEUTROS ABS: 2.3 10*3/uL (ref 1.7–7.7)
NEUTROS PCT: 60 % (ref 43–77)
PLATELETS: 150 10*3/uL (ref 150–400)
RBC: 4.9 MIL/uL (ref 4.22–5.81)
RDW: 15.2 % (ref 11.5–15.5)
WBC: 3.8 10*3/uL — ABNORMAL LOW (ref 4.0–10.5)

## 2015-03-21 LAB — BRAIN NATRIURETIC PEPTIDE: B NATRIURETIC PEPTIDE 5: 1099.8 pg/mL — AB (ref 0.0–100.0)

## 2015-03-21 LAB — PROTIME-INR
INR: 2.82 — ABNORMAL HIGH (ref 0.00–1.49)
PROTHROMBIN TIME: 29.7 s — AB (ref 11.6–15.2)

## 2015-03-21 LAB — TROPONIN I: TROPONIN I: 0.06 ng/mL — AB (ref <0.031)

## 2015-03-21 NOTE — ED Notes (Signed)
Patient transported to X-ray 

## 2015-03-21 NOTE — ED Notes (Signed)
Pt presents today w/ cough and shortness of breath, denies chest pain, onset approx 1 week ago per pt statement

## 2015-03-21 NOTE — ED Notes (Signed)
Pt states has cough and shortness of breath, sitting on side of stretcher, appears in no distress, very verbal able to give extensive history and complaints to EDP and nursing staff. Orders rec'd and immediately implemented

## 2015-03-21 NOTE — Discharge Instructions (Signed)
You may take Allegra, zyrtec, or claritin for your daily allergies. Return to the ER for any worsening shortness of breath or development of chest pain.   Cough, Adult  A cough is a reflex. It helps you clear your throat and airways. A cough can help heal your body. A cough can last 2 or 3 weeks (acute) or may last more than 8 weeks (chronic). Some common causes of a cough can include an infection, allergy, or a cold. HOME CARE  Only take medicine as told by your doctor.  If given, take your medicines (antibiotics) as told. Finish them even if you start to feel better.  Use a cold steam vaporizer or humidifier in your home. This can help loosen thick spit (secretions).  Sleep so you are almost sitting up (semi-upright). Use pillows to do this. This helps reduce coughing.  Rest as needed.  Stop smoking if you smoke. GET HELP RIGHT AWAY IF:  You have yellowish-white fluid (pus) in your thick spit.  Your cough gets worse.  Your medicine does not reduce coughing, and you are losing sleep.  You cough up blood.  You have trouble breathing.  Your pain gets worse and medicine does not help.  You have a fever. MAKE SURE YOU:   Understand these instructions.  Will watch your condition.  Will get help right away if you are not doing well or get worse. Document Released: 08/03/2011 Document Revised: 04/06/2014 Document Reviewed: 08/03/2011 [facility] Patient Information 2015 [place], [place]. This information is not intended to replace advice given to you by your health care provider. Make sure you discuss any questions you have with your health care provider.

## 2015-03-21 NOTE — ED Notes (Signed)
MD at bedside. 

## 2015-03-21 NOTE — ED Provider Notes (Signed)
CSN: [id number]     Arrival date & time 03/21/15  [id number] History   First MD Initiated Contact with Patient 03/21/15 [phone]     Chief Complaint  Patient presents with  . Shortness of Breath     (Consider location/radiation/quality/duration/timing/severity/associated sxs/prior Treatment) Patient is a 74 y.o. male presenting with shortness of breath. The history is provided by the patient.  Shortness of Breath Severity:  Mild Onset quality:  Gradual Timing:  Constant Progression:  Unchanged Chronicity:  Chronic Context: URI   Context comment:  Hx of chf Relieved by:  Diuretics Worsened by:  Activity Ineffective treatments:  None tried Associated symptoms: cough   Associated symptoms: no abdominal pain, no chest pain, no fever, no headaches, no neck pain and no vomiting   Cough:    Cough characteristics:  Non-productive   Severity:  Mild   Onset quality:  Gradual   Cough duration: 1-2.   Timing:  Constant   Progression:  Unchanged   Chronicity:  New   Past Medical History  Diagnosis Date  . Coronary artery disease   . Hypertension    Past Surgical History  Procedure Laterality Date  . Implantable cardioverter defibrillator implant     No family history on file. History  Substance Use Topics  . Smoking status: Former Smoker -- 0.50 packs/day    Types: Cigarettes    Start date: 05/08/1959    Quit date: 05/07/1976  . Smokeless tobacco: Not on file  . Alcohol Use: No    Review of Systems  Constitutional: Negative for fever.  HENT: Negative for drooling and rhinorrhea.   Eyes: Negative for pain.  Respiratory: Positive for cough and shortness of breath.   Cardiovascular: Negative for chest pain and leg swelling.  Gastrointestinal: Negative for nausea, vomiting, abdominal pain and diarrhea.  Genitourinary: Negative for dysuria and hematuria.  Musculoskeletal: Negative for gait problem and neck pain.  Skin: Negative for color change.  Neurological: Negative for  numbness and headaches.  Hematological: Negative for adenopathy.  Psychiatric/Behavioral: Negative for behavioral problems.  All other systems reviewed and are negative.     Allergies  Review of patient's allergies indicates no known allergies.  Home Medications   Prior to Admission medications   Medication Sig Start Date End Date Taking? Authorizing Provider  aspirin 325 MG tablet Take 325 mg by mouth daily.    Historical Provider, MD  diltiazem (CARDIZEM) 30 MG tablet Take 30 mg by mouth 4 (four) times daily.    Historical Provider, MD  furosemide (LASIX) 20 MG tablet Take 20 mg by mouth.    Historical Provider, MD  Multiple Vitamin (MULTIVITAMIN) tablet Take 1 tablet by mouth daily.    Historical Provider, MD  spironolactone (ALDACTONE) 25 MG tablet Take 25 mg by mouth daily.    Historical Provider, MD  warfarin (COUMADIN) 5 MG tablet Take 5 mg by mouth daily.    Historical Provider, MD   BP 104/52 mmHg  Pulse 72  Temp(Src) 97.9 F (36.6 C) (Oral)  Resp 24  Ht 6\' 1"  (1.854 m)  Wt 215 lb (97.523 kg)  BMI 28.37 kg/m2  SpO2 98% Physical Exam  Constitutional: He is oriented to person, place, and time. He appears well-developed and well-nourished.  HENT:  Head: Normocephalic and atraumatic.  Right Ear: External ear normal.  Left Ear: External ear normal.  Nose: Nose normal.  Mouth/Throat: Oropharynx is clear and moist. No oropharyngeal exudate.  Eyes: Conjunctivae and EOM are normal. Pupils are equal, round, and  reactive to light.  Neck: Normal range of motion. Neck supple.  Cardiovascular: Normal rate, regular rhythm, normal heart sounds and intact distal pulses.  Exam reveals no gallop and no friction rub.   No murmur heard. Pulmonary/Chest: Effort normal and breath sounds normal. No respiratory distress. He has no wheezes.  Abdominal: Soft. Bowel sounds are normal. He exhibits no distension. There is no tenderness. There is no rebound and no guarding.  Musculoskeletal:  Normal range of motion. He exhibits no edema or tenderness.  Neurological: He is alert and oriented to person, place, and time.  Skin: Skin is warm and dry.  Psychiatric: He has a normal mood and affect. His behavior is normal.  Nursing note and vitals reviewed.   ED Course  Procedures (including critical care time) Labs Review Labs Reviewed  CBC WITH DIFFERENTIAL/PLATELET - Abnormal; Notable for the following:    WBC 3.8 (*)    All other components within normal limits  TROPONIN I - Abnormal; Notable for the following:    Troponin I 0.06 (*)    All other components within normal limits  BRAIN NATRIURETIC PEPTIDE - Abnormal; Notable for the following:    B Natriuretic Peptide 1099.8 (*)    All other components within normal limits  COMPREHENSIVE METABOLIC PANEL - Abnormal; Notable for the following:    Glucose, Bld 164 (*)    BUN 26 (*)    Creatinine, Ser 1.38 (*)    AST 62 (*)    GFR calc non Af Amer 49 (*)    GFR calc Af Amer 57 (*)    All other components within normal limits  PROTIME-INR - Abnormal; Notable for the following:    Prothrombin Time 29.7 (*)    INR 2.82 (*)    All other components within normal limits    Imaging Review Dg Chest 2 View  03/21/2015   CLINICAL DATA:  Pt states that for the past week he has had sob with a cough. Hx of cad and htn.  EXAM: CHEST  2 VIEW  COMPARISON:  03/04/2015  FINDINGS: There is moderate enlargement of the cardiopericardial silhouette, similar to the prior exam. No mediastinal or hilar masses are noted and there is no evidence of adenopathy.  The lungs are clear.  No pleural effusion or pneumothorax.  Left anterior chest wall biventricular cardioverter-defibrillator is stable and well positioned.  IMPRESSION: 1. No acute cardiopulmonary disease. 2. Stable moderate cardiomegaly.   Electronically Signed   By: Lajean Manes M.D.   On: 03/21/2015 08:36     EKG Interpretation   Date/Time:  Sunday March 21 2015 08:06:42  EDT Ventricular Rate:  70 PR Interval:  148 QRS Duration: 146 QT Interval:  458 QTC Calculation: 494 R Axis:   -78 Text Interpretation:    Atrial-sensed ventricular-paced rhythm Abnormal ECG No significant change  since last tracing Confirmed by Lorn Butcher  MD, Alonda Weaber ([id number]) on 03/21/2015  8:12:20 AM       MDM   Final diagnoses:  Cough  SOB (shortness of breath)    8:00 AM 74 y.o. male w hx of CAD, HTN, on coumadin, dx w/ CHF in 10/2014 s/p AICD who pw nonproductive cough for about 1.5 weeks and shortness of breath with exertion. He denies any chest pain. He is afebrile and vital signs are unremarkable here. He has no extremity edema on exam and clear lung sounds. We'll get screening labs and imaging. He denies sob at rest.   9:40 AM: Labs showing what  appears to be chronically elevated troponin. His troponin today is 0.06 and it was also this last time when he was discharged. He has no chest pain. He is asymptomatic at rest and has no evidence of fluid overload or increased work of breathing. His lab work is otherwise unremarkable and his Coumadin is therapeutic. He is asleep on my repeat exam. Do not think repeat troponin is needed. Will recommend close follow-up with his cardiologist. I have discussed the diagnosis/risks/treatment options with the patient and believe the pt to be eligible for discharge home to follow-up with his cardiologist. We also discussed returning to the ED immediately if new or worsening sx occur. We discussed the sx which are most concerning (e.g., cp, worsening sob, fever) that necessitate immediate return. Medications administered to the patient during their visit and any new prescriptions provided to the patient are listed below.  Medications given during this visit Medications - No data to display  New Prescriptions   No medications on file      Pamella Pert, MD 03/21/15 [phone]

## 2015-03-21 NOTE — ED Notes (Signed)
12 lead EKG done, to EDP for evaluation

## 2015-03-21 NOTE — ED Notes (Signed)
Resting quietly w/ eyes closed, denies any discomfort at this time

## 2015-04-06 ENCOUNTER — Emergency Department (HOSPITAL_BASED_OUTPATIENT_CLINIC_OR_DEPARTMENT_OTHER)
Admission: EM | Admit: 2015-04-06 | Discharge: 2015-04-06 | Disposition: A | Discharge status: Home / Self Care | Payer: Medicare Other | Attending: Emergency Medicine | Admitting: Emergency Medicine

## 2015-04-06 ENCOUNTER — Emergency Department (HOSPITAL_BASED_OUTPATIENT_CLINIC_OR_DEPARTMENT_OTHER): Payer: Medicare Other

## 2015-04-06 ENCOUNTER — Encounter (HOSPITAL_BASED_OUTPATIENT_CLINIC_OR_DEPARTMENT_OTHER): Payer: Self-pay | Admitting: *Deleted

## 2015-04-06 DIAGNOSIS — Z7982 Long term (current) use of aspirin: Secondary | ICD-10-CM | POA: Insufficient documentation

## 2015-04-06 DIAGNOSIS — I509 Heart failure, unspecified: Secondary | ICD-10-CM | POA: Diagnosis not present

## 2015-04-06 DIAGNOSIS — Z79899 Other long term (current) drug therapy: Secondary | ICD-10-CM | POA: Insufficient documentation

## 2015-04-06 DIAGNOSIS — Z87891 Personal history of nicotine dependence: Secondary | ICD-10-CM | POA: Diagnosis not present

## 2015-04-06 DIAGNOSIS — R0602 Shortness of breath: Secondary | ICD-10-CM | POA: Diagnosis present

## 2015-04-06 DIAGNOSIS — I2510 Atherosclerotic heart disease of native coronary artery without angina pectoris: Secondary | ICD-10-CM | POA: Insufficient documentation

## 2015-04-06 DIAGNOSIS — Z7901 Long term (current) use of anticoagulants: Secondary | ICD-10-CM | POA: Diagnosis not present

## 2015-04-06 HISTORY — DX: Heart failure, unspecified: I50.9

## 2015-04-06 LAB — BASIC METABOLIC PANEL
ANION GAP: 1 — AB (ref 5–15)
BUN: 24 mg/dL — ABNORMAL HIGH (ref 6–20)
CHLORIDE: 114 mmol/L — AB (ref 101–111)
CO2: 25 mmol/L (ref 22–32)
Calcium: 8.6 mg/dL — ABNORMAL LOW (ref 8.9–10.3)
Creatinine, Ser: 1.11 mg/dL (ref 0.61–1.24)
Glucose, Bld: 113 mg/dL — ABNORMAL HIGH (ref 70–99)
Potassium: 3.8 mmol/L (ref 3.5–5.1)
Sodium: 140 mmol/L (ref 135–145)

## 2015-04-06 LAB — PROTIME-INR
INR: 2.58 — AB (ref 0.00–1.49)
Prothrombin Time: 27.7 seconds — ABNORMAL HIGH (ref 11.6–15.2)

## 2015-04-06 LAB — CBC WITH DIFFERENTIAL/PLATELET
BASOS ABS: 0 10*3/uL (ref 0.0–0.1)
Basophils Relative: 0 % (ref 0–1)
Eosinophils Absolute: 0.1 10*3/uL (ref 0.0–0.7)
Eosinophils Relative: 1 % (ref 0–5)
HCT: 39.2 % (ref 39.0–52.0)
Hemoglobin: 13.5 g/dL (ref 13.0–17.0)
LYMPHS PCT: 41 % (ref 12–46)
Lymphs Abs: 1.7 10*3/uL (ref 0.7–4.0)
MCH: 28.4 pg (ref 26.0–34.0)
MCHC: 34.4 g/dL (ref 30.0–36.0)
MCV: 82.4 fL (ref 78.0–100.0)
Monocytes Absolute: 0.5 10*3/uL (ref 0.1–1.0)
Monocytes Relative: 11 % (ref 3–12)
NEUTROS ABS: 1.9 10*3/uL (ref 1.7–7.7)
Neutrophils Relative %: 47 % (ref 43–77)
Platelets: 182 10*3/uL (ref 150–400)
RBC: 4.76 MIL/uL (ref 4.22–5.81)
RDW: 14.4 % (ref 11.5–15.5)
WBC: 4.1 10*3/uL (ref 4.0–10.5)

## 2015-04-06 LAB — TROPONIN I: Troponin I: 0.06 ng/mL — ABNORMAL HIGH (ref <0.031)

## 2015-04-06 LAB — BRAIN NATRIURETIC PEPTIDE: B NATRIURETIC PEPTIDE 5: 2366.8 pg/mL — AB (ref 0.0–100.0)

## 2015-04-06 MED ORDER — FUROSEMIDE 10 MG/ML IJ SOLN
40.0000 mg | Freq: Once | INTRAMUSCULAR | Status: AC
  Administered 2015-04-06: 40 mg via INTRAVENOUS
  Filled 2015-04-06: qty 4

## 2015-04-06 NOTE — ED Provider Notes (Signed)
CSN: [id number]     Arrival date & time 04/06/15  0434 History   First MD Initiated Contact with Patient 04/06/15 0510     Chief Complaint  Patient presents with  . Shortness of Breath     (Consider location/radiation/quality/duration/timing/severity/associated sxs/prior Treatment) HPI  This is a 74 year old male with congestive heart failure. He has known chronically elevated troponin in the range of 0.06-0.09 and a BNP usually over 1000. He has had shortness of breath and a dry cough "for a while". He has had 2 recent visits for the same with unremarkable workups but improvement with IV Lasix. His breathing worsened this morning and he states it is so severe that he cannot do anything without getting short of breath. He has not gotten relief with his usual medications. He states he was pacing the floor at 2 AM. He denies chest pain. He denies productive cough. He is not wheezing.   Past Medical History  Diagnosis Date  . Coronary artery disease   . CHF (congestive heart failure)    Past Surgical History  Procedure Laterality Date  . Implantable cardioverter defibrillator implant     History reviewed. No pertinent family history. History  Substance Use Topics  . Smoking status: Former Smoker -- 0.50 packs/day    Types: Cigarettes    Start date: 05/08/1959    Quit date: 05/07/1976  . Smokeless tobacco: Not on file  . Alcohol Use: No    Review of Systems  All other systems reviewed and are negative.   Allergies  Review of patient's allergies indicates no known allergies.  Home Medications   Prior to Admission medications   Medication Sig Start Date End Date Taking? Authorizing Provider  aspirin 325 MG tablet Take 325 mg by mouth daily.    Historical Provider, MD  diltiazem (CARDIZEM) 30 MG tablet Take 30 mg by mouth 4 (four) times daily.    Historical Provider, MD  furosemide (LASIX) 20 MG tablet Take 20 mg by mouth.    Historical Provider, MD  Multiple Vitamin  (MULTIVITAMIN) tablet Take 1 tablet by mouth daily.    Historical Provider, MD  spironolactone (ALDACTONE) 25 MG tablet Take 25 mg by mouth daily.    Historical Provider, MD  warfarin (COUMADIN) 5 MG tablet Take 5 mg by mouth daily.    Historical Provider, MD   BP 114/66 mmHg  Pulse 68  Temp(Src) 98.3 F (36.8 C) (Oral)  Resp 20  Ht 6\' 1"  (1.854 m)  Wt 218 lb (98.884 kg)  BMI 28.77 kg/m2  SpO2 99%   Physical Exam  General: Well-developed, well-nourished male in no acute distress; appearance consistent with age of record HENT: normocephalic; atraumatic Eyes: pupils equal, round and reactive to light; extraocular muscles intact; arcus senilis bilaterally Neck: supple Heart: regular rate and rhythm; no murmurs, rubs or gallops Lungs: clear to auscultation bilaterally Abdomen: soft; nondistended; nontender; no masses or hepatosplenomegaly; bowel sounds present Extremities: No deformity; full range of motion; pulses normal Neurologic: Awake, alert and oriented; motor function intact in all extremities and symmetric; no facial droop Skin: Warm and dry Psychiatric: Normal mood and affect    ED Course  Procedures (including critical care time)   MDM  Nursing notes and vitals signs, including pulse oximetry, reviewed.  Summary of this visit's results, reviewed by myself:   EKG Interpretation  Date/Time:  Tuesday Apr 06 2015 05:12:31 EDT Ventricular Rate:  73 PR Interval:  138 QRS Duration: 150 QT Interval:  476 QTC Calculation:  524 R Axis:   -77 Text Interpretation: Suspect unspecified pacemaker failure Atrial-sensed ventricular-paced rhythm Abnormal ECG No significant change was found Confirmed by Lynora Dymond  MD, Jenny Reichmann ([id number]) on 04/06/2015 5:15:56 AM      Labs:  Results for orders placed or performed during the hospital encounter of 04/06/15 (from the past 24 hour(s))  CBC with Differential     Status: None   Collection Time: 04/06/15  5:10 AM  Result Value Ref Range   WBC  4.1 4.0 - 10.5 K/uL   RBC 4.76 4.22 - 5.81 MIL/uL   Hemoglobin 13.5 13.0 - 17.0 g/dL   HCT 39.2 39.0 - 52.0 %   MCV 82.4 78.0 - 100.0 fL   MCH 28.4 26.0 - 34.0 pg   MCHC 34.4 30.0 - 36.0 g/dL   RDW 14.4 11.5 - 15.5 %   Platelets 182 150 - 400 K/uL   Neutrophils Relative % 47 43 - 77 %   Neutro Abs 1.9 1.7 - 7.7 K/uL   Lymphocytes Relative 41 12 - 46 %   Lymphs Abs 1.7 0.7 - 4.0 K/uL   Monocytes Relative 11 3 - 12 %   Monocytes Absolute 0.5 0.1 - 1.0 K/uL   Eosinophils Relative 1 0 - 5 %   Eosinophils Absolute 0.1 0.0 - 0.7 K/uL   Basophils Relative 0 0 - 1 %   Basophils Absolute 0.0 0.0 - 0.1 K/uL  Basic metabolic panel     Status: Abnormal   Collection Time: 04/06/15  5:10 AM  Result Value Ref Range   Sodium 140 135 - 145 mmol/L   Potassium 3.8 3.5 - 5.1 mmol/L   Chloride 114 (H) 101 - 111 mmol/L   CO2 25 22 - 32 mmol/L   Glucose, Bld 113 (H) 70 - 99 mg/dL   BUN 24 (H) 6 - 20 mg/dL   Creatinine, Ser 1.11 0.61 - 1.24 mg/dL   Calcium 8.6 (L) 8.9 - 10.3 mg/dL   GFR calc non Af Amer >60 >60 mL/min   GFR calc Af Amer >60 >60 mL/min   Anion gap 1 (L) 5 - 15  Brain natriuretic peptide     Status: Abnormal   Collection Time: 04/06/15  5:10 AM  Result Value Ref Range   B Natriuretic Peptide 2366.8 (H) 0.0 - 100.0 pg/mL  Troponin I     Status: Abnormal   Collection Time: 04/06/15  5:10 AM  Result Value Ref Range   Troponin I 0.06 (H) <0.031 ng/mL  Protime-INR     Status: Abnormal   Collection Time: 04/06/15  5:10 AM  Result Value Ref Range   Prothrombin Time 27.7 (H) 11.6 - 15.2 seconds   INR 2.58 (H) 0.00 - 1.49    Imaging Studies: Dg Chest 2 View  04/06/2015   CLINICAL DATA:  Dyspnea and cough, worsening over the past 2 days.  EXAM: CHEST  2 VIEW  COMPARISON:  03/21/2015  FINDINGS: There is marked cardiomegaly, unchanged. There are intact appearances of the transvenous leads. The lungs are clear. There are no effusions. Pulmonary vasculature is normal.  IMPRESSION: Marked  cardiomegaly.  No acute cardiopulmonary findings.   Electronically Signed   By: Andreas Newport M.D.   On: 04/06/2015 05:41   6:34 AM Brisk diuresis after IV Lasix. Patient feeling better. There is no indication for admission at this time. Elevated troponin is consistent with the patient's baseline.     Shanon Rosser, MD 04/06/15 [phone]

## 2015-04-06 NOTE — ED Notes (Addendum)
C/o sob and cough worse since Sunday,  Pt talking complete sentences,  No distress

## 2015-04-06 NOTE — ED Notes (Signed)
C/o sob and dry cough for awhile  Worse since sunday

## 2015-04-06 NOTE — ED Notes (Signed)
Pt states he feels better.

## 2015-04-12 ENCOUNTER — Emergency Department (HOSPITAL_BASED_OUTPATIENT_CLINIC_OR_DEPARTMENT_OTHER)
Admission: EM | Admit: 2015-04-12 | Discharge: 2015-04-12 | Disposition: A | Discharge status: Home / Self Care | Payer: Medicare Other | Attending: Emergency Medicine | Admitting: Emergency Medicine

## 2015-04-12 ENCOUNTER — Emergency Department (HOSPITAL_BASED_OUTPATIENT_CLINIC_OR_DEPARTMENT_OTHER): Payer: Medicare Other

## 2015-04-12 ENCOUNTER — Encounter (HOSPITAL_BASED_OUTPATIENT_CLINIC_OR_DEPARTMENT_OTHER): Payer: Self-pay | Admitting: Emergency Medicine

## 2015-04-12 DIAGNOSIS — R05 Cough: Secondary | ICD-10-CM | POA: Insufficient documentation

## 2015-04-12 DIAGNOSIS — Z95810 Presence of automatic (implantable) cardiac defibrillator: Secondary | ICD-10-CM | POA: Insufficient documentation

## 2015-04-12 DIAGNOSIS — R197 Diarrhea, unspecified: Secondary | ICD-10-CM | POA: Insufficient documentation

## 2015-04-12 DIAGNOSIS — R509 Fever, unspecified: Secondary | ICD-10-CM | POA: Diagnosis not present

## 2015-04-12 DIAGNOSIS — Z79899 Other long term (current) drug therapy: Secondary | ICD-10-CM | POA: Insufficient documentation

## 2015-04-12 DIAGNOSIS — Z7901 Long term (current) use of anticoagulants: Secondary | ICD-10-CM | POA: Diagnosis not present

## 2015-04-12 DIAGNOSIS — I2510 Atherosclerotic heart disease of native coronary artery without angina pectoris: Secondary | ICD-10-CM | POA: Diagnosis not present

## 2015-04-12 DIAGNOSIS — Z87891 Personal history of nicotine dependence: Secondary | ICD-10-CM | POA: Insufficient documentation

## 2015-04-12 DIAGNOSIS — Z7982 Long term (current) use of aspirin: Secondary | ICD-10-CM | POA: Diagnosis not present

## 2015-04-12 DIAGNOSIS — I509 Heart failure, unspecified: Secondary | ICD-10-CM | POA: Diagnosis not present

## 2015-04-12 DIAGNOSIS — R0602 Shortness of breath: Secondary | ICD-10-CM | POA: Diagnosis present

## 2015-04-12 LAB — CBC WITH DIFFERENTIAL/PLATELET
Basophils Absolute: 0 10*3/uL (ref 0.0–0.1)
Basophils Relative: 0 % (ref 0–1)
Eosinophils Absolute: 0.1 10*3/uL (ref 0.0–0.7)
Eosinophils Relative: 1 % (ref 0–5)
HCT: 39.2 % (ref 39.0–52.0)
Hemoglobin: 13.8 g/dL (ref 13.0–17.0)
LYMPHS PCT: 37 % (ref 12–46)
Lymphs Abs: 2 10*3/uL (ref 0.7–4.0)
MCH: 28.8 pg (ref 26.0–34.0)
MCHC: 35.2 g/dL (ref 30.0–36.0)
MCV: 81.8 fL (ref 78.0–100.0)
Monocytes Absolute: 0.6 10*3/uL (ref 0.1–1.0)
Monocytes Relative: 10 % (ref 3–12)
Neutro Abs: 2.9 10*3/uL (ref 1.7–7.7)
Neutrophils Relative %: 52 % (ref 43–77)
Platelets: 181 10*3/uL (ref 150–400)
RBC: 4.79 MIL/uL (ref 4.22–5.81)
RDW: 14.3 % (ref 11.5–15.5)
WBC: 5.5 10*3/uL (ref 4.0–10.5)

## 2015-04-12 LAB — BASIC METABOLIC PANEL
Anion gap: 8 (ref 5–15)
BUN: 31 mg/dL — ABNORMAL HIGH (ref 6–20)
CALCIUM: 9.1 mg/dL (ref 8.9–10.3)
CHLORIDE: 108 mmol/L (ref 101–111)
CO2: 23 mmol/L (ref 22–32)
CREATININE: 1.45 mg/dL — AB (ref 0.61–1.24)
GFR calc non Af Amer: 46 mL/min — ABNORMAL LOW (ref >60)
GFR, EST AFRICAN AMERICAN: 54 mL/min — AB (ref >60)
Glucose, Bld: 107 mg/dL — ABNORMAL HIGH (ref 70–99)
Potassium: 4.4 mmol/L (ref 3.5–5.1)
SODIUM: 139 mmol/L (ref 135–145)

## 2015-04-12 LAB — BRAIN NATRIURETIC PEPTIDE: B NATRIURETIC PEPTIDE 5: 2410.5 pg/mL — AB (ref 0.0–100.0)

## 2015-04-12 LAB — PROTIME-INR
INR: 1.68 — ABNORMAL HIGH (ref 0.00–1.49)
Prothrombin Time: 19.8 seconds — ABNORMAL HIGH (ref 11.6–15.2)

## 2015-04-12 LAB — TROPONIN I: Troponin I: 0.06 ng/mL — ABNORMAL HIGH (ref <0.031)

## 2015-04-12 MED ORDER — FUROSEMIDE 10 MG/ML IJ SOLN
40.0000 mg | Freq: Once | INTRAMUSCULAR | Status: AC
  Administered 2015-04-12: 40 mg via INTRAVENOUS
  Filled 2015-04-12: qty 4

## 2015-04-12 NOTE — ED Notes (Signed)
Patient reports that he has been SOB x the last 3 weeks - reports that today dry cough at night. Talked today to MD at Dr office and the patient was told that the medications cause him to cough.

## 2015-04-12 NOTE — ED Notes (Addendum)
Caryl Pina, RRT to start IV and draw labs.

## 2015-04-12 NOTE — Discharge Instructions (Signed)
Return to the ED with any concerns including difficulty breathing, chest pain, leg swelling, fainting, decreased level of alertness/lethargy, or any other alarming symptoms °

## 2015-04-12 NOTE — ED Provider Notes (Signed)
CSN: [id number]     Arrival date & time 04/12/15  1956 History   First MD Initiated Contact with Patient 04/12/15 2056    This chart was scribed for No att. providers found by Terressa Koyanagi, ED Scribe. This patient was seen in room MH08/MH08 and the patient's care was started at 9:53 PM.  Chief Complaint  Patient presents with  . Shortness of Breath   Patient is a 74 y.o. male presenting with shortness of breath. The history is provided by the patient. No language interpreter was used.  Shortness of Breath Severity:  Mild Onset quality:  Gradual Timing:  Constant Progression:  Unchanged Chronicity:  Recurrent Relieved by: Lasix  Associated symptoms: cough   Associated symptoms: no fever    PCP: [name], MD Cardiologist: Dr. Phillis Haggis  HPI Comments: Wymon Mcday is a 74 y.o. male, with PMH noted below including CHF who takes 20 mg of lasix 2x daily- does was increased to twice daily last week, who presents to the Emergency Department complaining of intermittent, recurrent SOB with associated dry cough onset three weeks ago. Pt had three recent visits to the ED regarding the same. Pt reports he felt better after his last visit to the ED, however, the day after returning home from the ED his Sx returned. Pt also reports dry cough- he wonders if this is due to his medications. Pt reports he f/u with his cardiologist this past week who increased the dosage of his fluid pill to 2 pills a day. Pt reports he has been experiencing some loose bowels since increasing the dosage of his fluid pills. Pt denies dizziness. There are no other associated systemic symptoms, there are no other alleviating or modifying factors.    Past Medical History  Diagnosis Date  . Coronary artery disease   . CHF (congestive heart failure)    Past Surgical History  Procedure Laterality Date  . Implantable cardioverter defibrillator implant     History reviewed. No pertinent family history. History  Substance  Use Topics  . Smoking status: Former Smoker -- 0.50 packs/day    Types: Cigarettes    Start date: 05/08/1959    Quit date: 05/07/1976  . Smokeless tobacco: Not on file  . Alcohol Use: No    Review of Systems  Constitutional: Negative for fever and chills.  Respiratory: Positive for cough and shortness of breath.   Gastrointestinal: Positive for diarrhea (loose bowel).  Neurological: Negative for dizziness and speech difficulty.  Hematological: Bruises/bleeds easily.  Psychiatric/Behavioral: Negative for confusion.  All other systems reviewed and are negative.  Allergies  Review of patient's allergies indicates no known allergies.  Home Medications   Prior to Admission medications   Medication Sig Start Date End Date Taking? Authorizing Provider  aspirin 325 MG tablet Take 325 mg by mouth daily.    Historical Provider, MD  diltiazem (CARDIZEM) 30 MG tablet Take 30 mg by mouth 4 (four) times daily.    Historical Provider, MD  furosemide (LASIX) 20 MG tablet Take 20 mg by mouth.    Historical Provider, MD  Multiple Vitamin (MULTIVITAMIN) tablet Take 1 tablet by mouth daily.    Historical Provider, MD  spironolactone (ALDACTONE) 25 MG tablet Take 25 mg by mouth daily.    Historical Provider, MD  warfarin (COUMADIN) 5 MG tablet Take 5 mg by mouth daily.    Historical Provider, MD   Triage Vitals: BP 120/75 mmHg  Pulse 78  SpO2 100% Vitals reviewed Physical Exam  Physical Examination:  General appearance - alert, well appearing, and in no distress Mental status - alert, oriented to person, place, and time Eyes - no conjunctival injection, no scleral icterus Mouth - mucous membranes moist, pharynx normal without lesions Chest - clear to auscultation, no wheezes, rales or rhonchi, symmetric air entry Heart - normal rate, regular rhythm, normal S1, S2, no murmurs, rubs, clicks or gallops Abdomen - soft, nontender, nondistended, no masses or organomegaly Extremities - peripheral  pulses normal, no pedal edema, no clubbing or cyanosis Skin - normal coloration and turgor, no rashes  ED Course  Procedures (including critical care time) DIAGNOSTIC STUDIES: Oxygen Saturation is 100% on RA, nl by my interpretation.    COORDINATION OF CARE: 9:58 PM-Discussed treatment plan which includes meds and possible admittance to the hospital with pt at bedside and pt agreed to plan.   Labs Review Labs Reviewed  BASIC METABOLIC PANEL - Abnormal; Notable for the following:    Glucose, Bld 107 (*)    BUN 31 (*)    Creatinine, Ser 1.45 (*)    GFR calc non Af Amer 46 (*)    GFR calc Af Amer 54 (*)    All other components within normal limits  BRAIN NATRIURETIC PEPTIDE - Abnormal; Notable for the following:    B Natriuretic Peptide 2410.5 (*)    All other components within normal limits  TROPONIN I - Abnormal; Notable for the following:    Troponin I 0.06 (*)    All other components within normal limits  PROTIME-INR - Abnormal; Notable for the following:    Prothrombin Time 19.8 (*)    INR 1.68 (*)    All other components within normal limits  CBC WITH DIFFERENTIAL/PLATELET    Imaging Review Dg Chest 2 View  04/12/2015   CLINICAL DATA:  Shortness of breath for several weeks. Pacemaker and defibrillator in place.  EXAM: CHEST  2 VIEW  COMPARISON:  04/08/2015  FINDINGS: Cardiac pacemaker unchanged. Cardiac enlargement. Normal pulmonary vascularity. No focal airspace disease or consolidation in the lungs. No blunting of costophrenic angles. No pneumothorax. Mediastinal contours appear intact. Degenerative changes in the spine.  IMPRESSION: Cardiac enlargement without change. No evidence of active pulmonary disease.   Electronically Signed   By: Lucienne Capers M.D.   On: 04/12/2015 21:06     EKG Interpretation   Date/Time:  Monday Apr 12 2015 20:04:03 EDT Ventricular Rate:  77 PR Interval:  160 QRS Duration: 136 QT Interval:  428 QTC Calculation: 484 R Axis:   -70 Text  Interpretation:  Atrial-sensed ventricular-paced rhythm Abnormal ECG  No significant change since last tracing Confirmed by [facility]  MD, Levenia Skalicky  [phone]) on 04/12/2015 10:04:32 PM      MDM   Final diagnoses:  Acute on chronic congestive heart failure, unspecified congestive heart failure type    Pt presenting with c/o shortness of breath when lying flat- he has increased lasix to twice daily.  He usually feels improved after one dose of IV lasix.  Troponin is at elevated baseline today.  Mild renal insufficiency- d/w patient and this will need to be rechecked and watched closely while he is on increased lasix.  D/w patient admitting him for further treatment for CHF- he states that he would prefer to go home, but if in 2 days he is still feeling the same way he will go to the hospital to be admitted.  Discharged with strict return precautions.  Pt agreeable with plan.   I personally performed the  services described in this documentation, which was scribed in my presence. The recorded information has been reviewed and is accurate.    Alfonzo Beers, MD 04/14/15 0930

## 2015-05-09 ENCOUNTER — Emergency Department (HOSPITAL_BASED_OUTPATIENT_CLINIC_OR_DEPARTMENT_OTHER): Payer: Medicare Other

## 2015-05-09 ENCOUNTER — Emergency Department (HOSPITAL_BASED_OUTPATIENT_CLINIC_OR_DEPARTMENT_OTHER)
Admission: EM | Admit: 2015-05-09 | Discharge: 2015-05-09 | Disposition: A | Discharge status: Home / Self Care | Payer: Medicare Other | Attending: Emergency Medicine | Admitting: Emergency Medicine

## 2015-05-09 ENCOUNTER — Encounter (HOSPITAL_BASED_OUTPATIENT_CLINIC_OR_DEPARTMENT_OTHER): Payer: Self-pay | Admitting: Emergency Medicine

## 2015-05-09 DIAGNOSIS — Z79899 Other long term (current) drug therapy: Secondary | ICD-10-CM | POA: Diagnosis not present

## 2015-05-09 DIAGNOSIS — Z95810 Presence of automatic (implantable) cardiac defibrillator: Secondary | ICD-10-CM | POA: Diagnosis not present

## 2015-05-09 DIAGNOSIS — Z87891 Personal history of nicotine dependence: Secondary | ICD-10-CM | POA: Insufficient documentation

## 2015-05-09 DIAGNOSIS — I5022 Chronic systolic (congestive) heart failure: Secondary | ICD-10-CM | POA: Insufficient documentation

## 2015-05-09 DIAGNOSIS — R0602 Shortness of breath: Secondary | ICD-10-CM | POA: Diagnosis present

## 2015-05-09 DIAGNOSIS — I2510 Atherosclerotic heart disease of native coronary artery without angina pectoris: Secondary | ICD-10-CM | POA: Diagnosis not present

## 2015-05-09 DIAGNOSIS — Z7982 Long term (current) use of aspirin: Secondary | ICD-10-CM | POA: Insufficient documentation

## 2015-05-09 DIAGNOSIS — Z7901 Long term (current) use of anticoagulants: Secondary | ICD-10-CM | POA: Diagnosis not present

## 2015-05-09 DIAGNOSIS — M25473 Effusion, unspecified ankle: Secondary | ICD-10-CM | POA: Diagnosis not present

## 2015-05-09 LAB — CBC WITH DIFFERENTIAL/PLATELET
BASOS ABS: 0 10*3/uL (ref 0.0–0.1)
Basophils Relative: 1 % (ref 0–1)
EOS PCT: 1 % (ref 0–5)
Eosinophils Absolute: 0 10*3/uL (ref 0.0–0.7)
HEMATOCRIT: 40.8 % (ref 39.0–52.0)
HEMOGLOBIN: 14 g/dL (ref 13.0–17.0)
Lymphocytes Relative: 36 % (ref 12–46)
Lymphs Abs: 1.6 10*3/uL (ref 0.7–4.0)
MCH: 28.2 pg (ref 26.0–34.0)
MCHC: 34.3 g/dL (ref 30.0–36.0)
MCV: 82.1 fL (ref 78.0–100.0)
MONO ABS: 0.4 10*3/uL (ref 0.1–1.0)
Monocytes Relative: 10 % (ref 3–12)
Neutro Abs: 2.4 10*3/uL (ref 1.7–7.7)
Neutrophils Relative %: 52 % (ref 43–77)
Platelets: 153 10*3/uL (ref 150–400)
RBC: 4.97 MIL/uL (ref 4.22–5.81)
RDW: 14.1 % (ref 11.5–15.5)
WBC: 4.4 10*3/uL (ref 4.0–10.5)

## 2015-05-09 LAB — COMPREHENSIVE METABOLIC PANEL
ALK PHOS: 65 U/L (ref 38–126)
ALT: 44 U/L (ref 17–63)
ANION GAP: 7 (ref 5–15)
AST: 40 U/L (ref 15–41)
Albumin: 3.5 g/dL (ref 3.5–5.0)
BILIRUBIN TOTAL: 0.9 mg/dL (ref 0.3–1.2)
BUN: 29 mg/dL — AB (ref 6–20)
CALCIUM: 8.9 mg/dL (ref 8.9–10.3)
CHLORIDE: 109 mmol/L (ref 101–111)
CO2: 25 mmol/L (ref 22–32)
Creatinine, Ser: 1.4 mg/dL — ABNORMAL HIGH (ref 0.61–1.24)
GFR calc Af Amer: 56 mL/min — ABNORMAL LOW (ref >60)
GFR, EST NON AFRICAN AMERICAN: 48 mL/min — AB (ref >60)
Glucose, Bld: 135 mg/dL — ABNORMAL HIGH (ref 65–99)
POTASSIUM: 4.2 mmol/L (ref 3.5–5.1)
SODIUM: 141 mmol/L (ref 135–145)
TOTAL PROTEIN: 6.7 g/dL (ref 6.5–8.1)

## 2015-05-09 LAB — TROPONIN I: TROPONIN I: 0.05 ng/mL — AB (ref <0.031)

## 2015-05-09 LAB — PROTIME-INR
INR: 2.27 — AB (ref 0.00–1.49)
Prothrombin Time: 24.8 seconds — ABNORMAL HIGH (ref 11.6–15.2)

## 2015-05-09 LAB — BRAIN NATRIURETIC PEPTIDE: B NATRIURETIC PEPTIDE 5: 2422.4 pg/mL — AB (ref 0.0–100.0)

## 2015-05-09 LAB — D-DIMER, QUANTITATIVE (NOT AT ARMC): D DIMER QUANT: 0.37 ug{FEU}/mL (ref 0.00–0.48)

## 2015-05-09 MED ORDER — OMEPRAZOLE 20 MG PO CPDR
20.0000 mg | DELAYED_RELEASE_CAPSULE | Freq: Every day | ORAL | Status: DC
Start: 2015-05-09 — End: 2015-11-29

## 2015-05-09 MED ORDER — FUROSEMIDE 10 MG/ML IJ SOLN
20.0000 mg | Freq: Once | INTRAMUSCULAR | Status: AC
  Administered 2015-05-09: 20 mg via INTRAVENOUS
  Filled 2015-05-09: qty 2

## 2015-05-09 NOTE — ED Notes (Signed)
Patient transported to X-ray 

## 2015-05-09 NOTE — ED Provider Notes (Addendum)
CSN: [id number]     Arrival date & time 05/09/15  0409 History   First MD Initiated Contact with Patient 05/09/15 [phone]     Chief Complaint  Patient presents with  . Shortness of Breath     (Consider location/radiation/quality/duration/timing/severity/associated sxs/prior Treatment) Patient is a 74 y.o. male presenting with shortness of breath. The history is provided by the patient.  Shortness of Breath Severity:  Moderate Onset quality:  Gradual Timing:  Constant Progression:  Waxing and waning Chronicity:  Chronic Context comment:  Laying flat at night makes it worse Relieved by:  Nothing Worsened by:  Nothing tried Ineffective treatments:  None tried Associated symptoms: PND   Associated symptoms: no chest pain, no cough, no diaphoresis, no fever, no sputum production and no wheezing   Risk factors: no recent surgery     Past Medical History  Diagnosis Date  . Coronary artery disease   . CHF (congestive heart failure)    Past Surgical History  Procedure Laterality Date  . Implantable cardioverter defibrillator implant     History reviewed. No pertinent family history. History  Substance Use Topics  . Smoking status: Former Smoker -- 0.50 packs/day    Types: Cigarettes    Start date: 05/08/1959    Quit date: 05/07/1976  . Smokeless tobacco: Not on file  . Alcohol Use: No    Review of Systems  Constitutional: Negative for fever and diaphoresis.  Respiratory: Positive for shortness of breath. Negative for cough, sputum production, chest tightness and wheezing.        Laying flat at night ongoing not new  Cardiovascular: Positive for PND. Negative for chest pain and palpitations.  All other systems reviewed and are negative.     Allergies  Review of patient's allergies indicates no known allergies.  Home Medications   Prior to Admission medications   Medication Sig Start Date End Date Taking? Authorizing Provider  aspirin 325 MG tablet Take 325 mg by mouth  daily.    Historical Provider, MD  diltiazem (CARDIZEM) 30 MG tablet Take 30 mg by mouth 4 (four) times daily.    Historical Provider, MD  furosemide (LASIX) 20 MG tablet Take 20 mg by mouth.    Historical Provider, MD  Multiple Vitamin (MULTIVITAMIN) tablet Take 1 tablet by mouth daily.    Historical Provider, MD  spironolactone (ALDACTONE) 25 MG tablet Take 25 mg by mouth daily.    Historical Provider, MD  warfarin (COUMADIN) 5 MG tablet Take 5 mg by mouth daily.    Historical Provider, MD   BP 110/77 mmHg  Pulse 61  Temp(Src) 98.1 F (36.7 C) (Oral)  Resp 23  Wt 220 lb (99.791 kg)  SpO2 100% Physical Exam  Constitutional: He is oriented to person, place, and time. He appears well-developed and well-nourished. No distress.  HENT:  Head: Normocephalic and atraumatic.  Mouth/Throat: Oropharynx is clear and moist.  Eyes: Conjunctivae and EOM are normal. Pupils are equal, round, and reactive to light.  Neck: Normal range of motion. Neck supple.  Cardiovascular: Normal rate, regular rhythm and intact distal pulses.   Pulmonary/Chest: Effort normal and breath sounds normal. No respiratory distress. He has no wheezes. He has no rales. He exhibits no tenderness.  Abdominal: Soft. Bowel sounds are normal. There is no tenderness. There is no rebound and no guarding.  Musculoskeletal: He exhibits edema.  Trace ankle edema  Neurological: He is alert and oriented to person, place, and time.  Skin: Skin is warm and dry.  Psychiatric: He has a normal mood and affect.    ED Course  Procedures (including critical care time) Labs Review Labs Reviewed  COMPREHENSIVE METABOLIC PANEL - Abnormal; Notable for the following:    Glucose, Bld 135 (*)    BUN 29 (*)    Creatinine, Ser 1.40 (*)    GFR calc non Af Amer 48 (*)    GFR calc Af Amer 56 (*)    All other components within normal limits  TROPONIN I - Abnormal; Notable for the following:    Troponin I 0.05 (*)    All other components within  normal limits  BRAIN NATRIURETIC PEPTIDE - Abnormal; Notable for the following:    B Natriuretic Peptide 2422.4 (*)    All other components within normal limits  CBC WITH DIFFERENTIAL/PLATELET  D-DIMER, QUANTITATIVE (NOT AT [facility])    Imaging Review Dg Chest 2 View  05/09/2015   CLINICAL DATA:  Shortness of breath. Chronic intermittent symptoms, worsened tonight.  EXAM: CHEST  2 VIEW  COMPARISON:  05/02/2015  FINDINGS: Multi lead left-sided pacemaker remains in place. Cardiomegaly is unchanged from prior exam. Minimal atelectasis at the left lung base. There is no pulmonary edema, pleural effusion, pneumothorax or confluent airspace disease. Stable degenerative change in the spine.  IMPRESSION: Stable cardiomegaly. No congestive failure or acute pulmonary process.   Electronically Signed   By: Jeb Levering M.D.   On: 05/09/2015 05:12     EKG Interpretation None      MDM   Final diagnoses:  Chronic systolic congestive heart failure   ED ECG REPORT   Date: 05/09/2015  Rate:70  Rhythm: paced  QRS Axis: left  Intervals: normal  ST/T Wave abnormalities: normal  Conduction Disutrbances:none  Narrative Interpretation:   Old EKG Reviewed: none available  I have personally reviewed the EKG tracing and agree with the computerized printout as noted.  He denies DOE, CP, n/v/d.  Following up with his cardiologist.  Have treated with lasix and feels markedly improved in the ED. In triage was 97-98% on room air.  Troponin is improved for baseline in our system and symptoms > 8 hours duration.  Suspect patient needs a CPAP machine.  Will also start PPI as patient is sleeping with bed partially up and may have a reflux component.  Return immediately for chest pain shoulder pain DOE SOB worsening edema or any concerns  Lashanti Chambless, MD 05/09/15 0641  Ashden Sonnenberg, MD 05/09/15 [phone]

## 2015-05-09 NOTE — Discharge Instructions (Signed)
Cardiac Rehabilitation Cardiac rehabilitation is a medically supervised program that helps improve the health and well-being of people with heart problems. Cardiac rehabilitation includes exercise training, education, and counseling to help you get stronger and return to an active lifestyle. People who participate in cardiac rehabilitation programs get better faster and reduce future hospital stays. Cardiac rehabilitation programs can help when you have had the following conditions:  Heart attack.  Heart failure.  Peripheral artery disease.  Coronary artery disease.  Angina.  Lung or breathing problems. Cardiac rehabilitation programs are also used when you have the following procedures:  Coronary artery bypass graft surgery.  Heart valve replacement.  Heart stent placement.  Heart transplant.  Aneurysm repair. CARDIAC REHABILITATION MAY HELP YOU:  Reduce problems like chest pain and trouble breathing.  Change risk factors that contribute to heart disease, such as:  Smoking.  High blood pressure.  High cholesterol.  Diabetes.  Being out of shape or not active.  Weighing more than 30% over your ideal weight.  Diet.  Improve your mental outlook so you feel:  Less depressed or "blue."  More hopeful.  Better about yourself.  More confident about taking care of yourself.  Get support from health experts as well as other people with similar problems.  Learn how to manage and understand your medicines.  Teach your family about your condition and how to participate in your recovery. WHAT HAPPENS IN CARDIAC REHABILITATION? You will be assessed by a cardiac rehabilitation team. They will check your health history and do a physical exam. You may need blood tests, stress tests, and other evaluations. You may not start a cardiac rehabilitation program if:  You develop angina with exercise or while at rest.  You have severe heart failure that limits your  activity.  You have an abnormal heart rhythm at rest.  You develop heart rhythm problems during exercise.  You have high blood pressure that is not controlled. The cardiac rehabilitation team works with you to make a plan based on your health and goals. Everyone is unique, so each program is customized and your program may change as you progress. Members of a typical cardiac rehabilitation team may include such health professionals as:  Doctors.  Nurses.  Dietitians.  Psychologists.  Exercise specialists.  Physical and occupational therapists. A typical cardiac rehabilitation program is divided into phases. You advance from one phase to the next. Most cardiac rehabilitation sessions last for 60 minutes, 3 times a week.  Phase One starts while you are still in the hospital. You may start by walking in your room and then in the hall. You may start some simple exercises with a therapist. Health care team members will give you information and ask you many questions. You may not be able to remember details, so have a family member or an advocate with you to help keep track of information.  Phase Two begins when you go home or to another facility. This phase may last 8 to 12 weeks. You will travel to a cardiac rehabilitation center or a place where it is offered. Typically, you gradually increase your activity while being closely watched by a nurse or therapist. Exercises may be a combination of strength or resistance training and "cardio" or aerobic movement on a treadmill or other machines. Your condition will determine how often and how long these sessions will last.  In phase two, you may learn how to cook healthy meals, control your blood sugar, and manage your medicines. You may need  help with scheduling or planning how and when to take your medicines. Use a timer, divided pill box, or follow a form to make taking your medicines easier. Use the method that works best for you. Some medicines  should not be taken with certain foods. If you take more than one blood pressure medicine, you may need to stagger the times you take them. Taking all your blood pressure medicine at the same time may lower your blood pressure too much. If you have questions about your medicines, ask your health care provider questions until you understand.  Phase Three continues for the rest of your life. There will be less supervision. You may still participate in cardiac rehabilitation activities or become part of a group in your community. You may benefit from talking to other people about your experience if they are facing similar challenges. How soon you drive, have sex, or return to work will depend on your condition. These decisions should be made by you and your health care provider. If you need help, ask for it. Find out where you can get the help you need. Ask questions until you get answers and understand. SEEK IMMEDIATE MEDICAL CARE IF:  Get medical help at once if you experience any of the following symptoms:  Severe chest discomfort, especially if the pain is crushing or pressure-like and spreads to the arms, back, neck, or jaw. Do not wait to see if the pain will go away.  Weakness or numbness in your face, arms, or legs, especially on one side of the body; slurred speech; confusion; sudden severe headache or loss of vision (all symptoms of stroke).  You have shortness of breath.  You are sweating and feel sick to your stomach (nausea).  You feel dizzy or faint.  You experience profound tiredness (fatigue). Call your local emergency service (911 in the [place] Do not drive yourself to the hospital. Document Released: 08/29/2008 Document Revised: 04/06/2014 Document Reviewed: 02/24/2011 [facility] Patient Information 2015 [place], [place]. This information is not intended to replace advice given to you by your health care provider. Make sure you discuss any questions you have with your health care  provider.

## 2015-05-09 NOTE — ED Notes (Signed)
Pt reports SOB seen in recent past for same .

## 2015-05-09 NOTE — ED Notes (Signed)
Spencer Rice currently complaining of episode of increased SOB and O2 sat at 90%,  placed on 2 l n/c O2 saturation now at 100 %

## 2015-05-19 ENCOUNTER — Other Ambulatory Visit: Payer: Self-pay | Admitting: Radiology

## 2015-05-19 DIAGNOSIS — C642 Malignant neoplasm of left kidney, except renal pelvis: Secondary | ICD-10-CM

## 2015-06-08 ENCOUNTER — Other Ambulatory Visit (HOSPITAL_COMMUNITY): Payer: Self-pay | Admitting: Interventional Radiology

## 2015-06-08 ENCOUNTER — Other Ambulatory Visit: Payer: Self-pay | Admitting: Radiology

## 2015-06-08 DIAGNOSIS — C642 Malignant neoplasm of left kidney, except renal pelvis: Secondary | ICD-10-CM

## 2015-06-08 DIAGNOSIS — C641 Malignant neoplasm of right kidney, except renal pelvis: Secondary | ICD-10-CM

## 2015-06-29 ENCOUNTER — Ambulatory Visit
Admission: RE | Admit: 2015-06-29 | Discharge: 2015-06-29 | Disposition: A | Discharge status: Home / Self Care | Payer: Medicare Other | Source: Ambulatory Visit | Attending: Interventional Radiology | Admitting: Interventional Radiology

## 2015-06-29 DIAGNOSIS — C649 Malignant neoplasm of unspecified kidney, except renal pelvis: Secondary | ICD-10-CM | POA: Insufficient documentation

## 2015-06-29 DIAGNOSIS — C642 Malignant neoplasm of left kidney, except renal pelvis: Secondary | ICD-10-CM

## 2015-06-29 NOTE — Progress Notes (Signed)
Chief Complaint  Patient presents with  . 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 He has been doing well with no complains. He continues to work regularly.  Past Medical History  Diagnosis Date  . Coronary artery disease   . CHF (congestive heart failure)     Past Surgical History  Procedure Laterality Date  . Implantable cardioverter defibrillator implant      Allergies: Review of patient's allergies indicates no known allergies.  Medications: Prior to Admission medications   Medication Sig Start Date End Date Taking? Authorizing Provider  aspirin 325 MG tablet Take 325 mg by mouth daily.   Yes Historical Provider, MD  diltiazem (CARDIZEM) 30 MG tablet Take 30 mg by mouth 4 (four) times daily.   Yes Historical Provider, MD  furosemide (LASIX) 20 MG tablet Take 20 mg by mouth.   Yes Historical Provider, MD  omeprazole (PRILOSEC) 20 MG capsule Take 1 capsule (20 mg total) by mouth daily. 05/09/15  Yes April Palumbo, MD  spironolactone (ALDACTONE) 25 MG tablet Take 25 mg by mouth daily.   Yes Historical Provider, MD  warfarin (COUMADIN) 5 MG tablet Take 5 mg by mouth daily.   Yes Historical Provider, MD  Multiple Vitamin (MULTIVITAMIN) tablet Take 1 tablet by mouth daily.    Historical Provider, MD     No family history on file.  History   Social History  . Marital Status: Married    Spouse Name: N/A  . Number of Children: N/A  . Years of Education: N/A   Social History Main Topics  . Smoking status: Former Smoker -- 0.50 packs/day    Types: Cigarettes    Start date: 05/08/1959    Quit date: 05/07/1976  . Smokeless tobacco: Not on file  . Alcohol Use: No  . Drug Use: No  . Sexual Activity: Not on file   Other Topics Concern  . None   Social History Narrative    Review of Systems:  A 12 point ROS discussed and pertinent positives are indicated in the HPI above.  All other systems are negative.  Review of Systems  Constitutional: Negative.   Respiratory: Negative.   Cardiovascular: Negative.   Gastrointestinal: Negative.   Genitourinary: Negative.   Musculoskeletal: Negative.   Neurological: Negative.      Vital Signs: BP 108/59 mmHg  Pulse 65  Temp(Src) 98 F (36.7 C) (Oral)  Resp 14  SpO2 98%  Physical Exam  Constitutional: He is oriented to person, place, and time. He appears well-developed and well-nourished. No distress.  Abdominal: Soft. He exhibits no distension and no mass. There is no tenderness. There is no rebound and no guarding.  Neurological: He is alert and oriented to person, place, and time.  Skin: He is not diaphoretic.  Nursing note and vitals reviewed.   Mallampati Score:     Imaging: No results found.  Labs:  CBC:  Recent Labs  03/21/15 0820 04/06/15 0510 04/12/15 2020 05/09/15 0443  WBC 3.8* 4.1 5.5 4.4  HGB 13.7 13.5 13.8 14.0  HCT 40.8 39.2 39.2 40.8  PLT 150 182 181 153    COAGS:  Recent Labs  03/21/15 0855 04/06/15 0510 04/12/15 2018 05/09/15 0433  INR 2.82* 2.58* 1.68* 2.27*    BMP:  Recent Labs  03/21/15  [id number] 04/06/15 0510 04/12/15 2020 05/09/15 0443  NA 139 140 139 141  K 5.0 3.8 4.4 4.2  CL 107 114* 108 109  CO2 25 25 23 25   GLUCOSE 164* 113* 107* 135*  BUN 26* 24* 31* 29*  CALCIUM 8.7 8.6* 9.1 8.9  CREATININE 1.38* 1.11 1.45* 1.40*  GFRNONAA 49* >60 46* 48*  GFRAA 57* >60 54* 56*    LIVER FUNCTION TESTS:  Recent Labs  10/23/14 0250 03/04/15 1859 03/21/15 0855 05/09/15 0443  BILITOT 0.8 0.6 1.1 0.9  AST 58* 50* 62* 40  ALT 62* 40 41 44  ALKPHOS 67 61 65 65  PROT 7.0 6.5 6.6 6.7  ALBUMIN 3.6 3.7 3.7 3.5    TUMOR MARKERS: No results for input(s): AFPTM, CEA, CA199, CHROMGRNA in the last 8760 hours.  Assessment and Plan:  CT of the abdomen on 06/25/2015 demonstrates  further retraction of a cryoablation defect of the posterior interpolar left kidney without evidence of abnormal enhancement. No tumor recurrence is identified. Note was made of a mildly heterogeneous area of enhancement in the medial aspect of the kidney inferior to the ablation defect. I reviewed this finding which appears stable since a prior study on 12/23/2014. This is also in an area where a small 1.6 cm cyst was present prior to treatment. Findings may relate to collapse of the previously noted cyst. Renal function is stable. I recommended a follow-up CT in one year.  SignedAletta Edouard T 06/29/2015, 3:32 PM     I spent a total of 15 Minutes in face to face in clinical consultation, greater than 50% of which was counseling/coordinating care post cryoablation of a left renal carcinoma.

## 2015-08-16 DIAGNOSIS — C649 Malignant neoplasm of unspecified kidney, except renal pelvis: Secondary | ICD-10-CM | POA: Insufficient documentation

## 2015-11-29 ENCOUNTER — Observation Stay (HOSPITAL_BASED_OUTPATIENT_CLINIC_OR_DEPARTMENT_OTHER)
Admission: EM | Admit: 2015-11-29 | Discharge: 2015-11-30 | Disposition: A | Discharge status: Home / Self Care | Payer: Medicare Other | Attending: Internal Medicine | Admitting: Internal Medicine

## 2015-11-29 ENCOUNTER — Emergency Department (HOSPITAL_BASED_OUTPATIENT_CLINIC_OR_DEPARTMENT_OTHER): Payer: Medicare Other

## 2015-11-29 ENCOUNTER — Encounter (HOSPITAL_BASED_OUTPATIENT_CLINIC_OR_DEPARTMENT_OTHER): Payer: Self-pay | Admitting: Emergency Medicine

## 2015-11-29 DIAGNOSIS — R0789 Other chest pain: Secondary | ICD-10-CM | POA: Diagnosis not present

## 2015-11-29 DIAGNOSIS — Z7982 Long term (current) use of aspirin: Secondary | ICD-10-CM | POA: Diagnosis not present

## 2015-11-29 DIAGNOSIS — I428 Other cardiomyopathies: Secondary | ICD-10-CM | POA: Insufficient documentation

## 2015-11-29 DIAGNOSIS — Z79899 Other long term (current) drug therapy: Secondary | ICD-10-CM | POA: Insufficient documentation

## 2015-11-29 DIAGNOSIS — Z87891 Personal history of nicotine dependence: Secondary | ICD-10-CM | POA: Diagnosis not present

## 2015-11-29 DIAGNOSIS — K219 Gastro-esophageal reflux disease without esophagitis: Secondary | ICD-10-CM | POA: Diagnosis not present

## 2015-11-29 DIAGNOSIS — Z95810 Presence of automatic (implantable) cardiac defibrillator: Secondary | ICD-10-CM | POA: Insufficient documentation

## 2015-11-29 DIAGNOSIS — I110 Hypertensive heart disease with heart failure: Secondary | ICD-10-CM | POA: Insufficient documentation

## 2015-11-29 DIAGNOSIS — I5023 Acute on chronic systolic (congestive) heart failure: Secondary | ICD-10-CM

## 2015-11-29 DIAGNOSIS — Z7901 Long term (current) use of anticoagulants: Secondary | ICD-10-CM | POA: Insufficient documentation

## 2015-11-29 DIAGNOSIS — I2510 Atherosclerotic heart disease of native coronary artery without angina pectoris: Secondary | ICD-10-CM | POA: Diagnosis not present

## 2015-11-29 DIAGNOSIS — I10 Essential (primary) hypertension: Secondary | ICD-10-CM

## 2015-11-29 DIAGNOSIS — R079 Chest pain, unspecified: Secondary | ICD-10-CM | POA: Diagnosis not present

## 2015-11-29 DIAGNOSIS — Z86718 Personal history of other venous thrombosis and embolism: Secondary | ICD-10-CM | POA: Diagnosis not present

## 2015-11-29 DIAGNOSIS — Z85528 Personal history of other malignant neoplasm of kidney: Secondary | ICD-10-CM | POA: Insufficient documentation

## 2015-11-29 HISTORY — DX: Presence of automatic (implantable) cardiac defibrillator: Z95.810

## 2015-11-29 HISTORY — DX: Personal history of other diseases of the digestive system: Z87.19

## 2015-11-29 HISTORY — DX: Gastro-esophageal reflux disease without esophagitis: K21.9

## 2015-11-29 HISTORY — DX: Malignant neoplasm of left kidney, except renal pelvis: C64.2

## 2015-11-29 HISTORY — DX: Acute embolism and thrombosis of unspecified deep veins of unspecified lower extremity: I82.409

## 2015-11-29 LAB — URINALYSIS, ROUTINE W REFLEX MICROSCOPIC
Bilirubin Urine: NEGATIVE
GLUCOSE, UA: NEGATIVE mg/dL
Hgb urine dipstick: NEGATIVE
Ketones, ur: NEGATIVE mg/dL
LEUKOCYTES UA: NEGATIVE
Nitrite: NEGATIVE
PH: 5.5 (ref 5.0–8.0)
PROTEIN: NEGATIVE mg/dL
SPECIFIC GRAVITY, URINE: 1.023 (ref 1.005–1.030)

## 2015-11-29 LAB — COMPREHENSIVE METABOLIC PANEL
ALBUMIN: 3.5 g/dL (ref 3.5–5.0)
ALT: 19 U/L (ref 17–63)
AST: 28 U/L (ref 15–41)
Alkaline Phosphatase: 64 U/L (ref 38–126)
Anion gap: 4 — ABNORMAL LOW (ref 5–15)
BUN: 29 mg/dL — AB (ref 6–20)
CHLORIDE: 110 mmol/L (ref 101–111)
CO2: 26 mmol/L (ref 22–32)
CREATININE: 1.33 mg/dL — AB (ref 0.61–1.24)
Calcium: 8.6 mg/dL — ABNORMAL LOW (ref 8.9–10.3)
GFR calc Af Amer: 59 mL/min — ABNORMAL LOW (ref >60)
GFR calc non Af Amer: 51 mL/min — ABNORMAL LOW (ref >60)
GLUCOSE: 171 mg/dL — AB (ref 65–99)
POTASSIUM: 3.9 mmol/L (ref 3.5–5.1)
SODIUM: 140 mmol/L (ref 135–145)
Total Bilirubin: 0.5 mg/dL (ref 0.3–1.2)
Total Protein: 6.5 g/dL (ref 6.5–8.1)

## 2015-11-29 LAB — CBC
HCT: 41.5 % (ref 39.0–52.0)
Hemoglobin: 14 g/dL (ref 13.0–17.0)
MCH: 28.3 pg (ref 26.0–34.0)
MCHC: 33.7 g/dL (ref 30.0–36.0)
MCV: 84 fL (ref 78.0–100.0)
PLATELETS: 121 10*3/uL — AB (ref 150–400)
RBC: 4.94 MIL/uL (ref 4.22–5.81)
RDW: 13.8 % (ref 11.5–15.5)
WBC: 3.7 10*3/uL — AB (ref 4.0–10.5)

## 2015-11-29 LAB — PROTIME-INR
INR: 3.06 — AB (ref 0.00–1.49)
Prothrombin Time: 31.1 seconds — ABNORMAL HIGH (ref 11.6–15.2)

## 2015-11-29 LAB — TROPONIN I
TROPONIN I: 0.06 ng/mL — AB (ref <0.031)
TROPONIN I: 0.05 ng/mL — AB (ref <0.031)
Troponin I: 0.04 ng/mL — ABNORMAL HIGH (ref <0.031)

## 2015-11-29 LAB — BRAIN NATRIURETIC PEPTIDE: B Natriuretic Peptide: 1057 pg/mL — ABNORMAL HIGH (ref 0.0–100.0)

## 2015-11-29 MED ORDER — ONDANSETRON HCL 4 MG PO TABS: 4.0000 mg | ORAL_TABLET | Freq: Four times a day (QID) | ORAL | Status: DC | PRN

## 2015-11-29 MED ORDER — GI COCKTAIL ~~LOC~~: 30.0000 mL | Freq: Four times a day (QID) | ORAL | Status: DC | PRN

## 2015-11-29 MED ORDER — PANTOPRAZOLE SODIUM 40 MG PO TBEC
40.0000 mg | DELAYED_RELEASE_TABLET | Freq: Every day | ORAL | Status: DC
  Administered 2015-11-29 – 2015-11-30 (×2): 40 mg via ORAL
  Filled 2015-11-29 (×2): qty 1

## 2015-11-29 MED ORDER — DILTIAZEM HCL 30 MG PO TABS: 30.0000 mg | ORAL_TABLET | Freq: Four times a day (QID) | ORAL | Status: DC

## 2015-11-29 MED ORDER — ONDANSETRON HCL 4 MG/2ML IJ SOLN: 4.0000 mg | Freq: Four times a day (QID) | INTRAMUSCULAR | Status: DC | PRN

## 2015-11-29 MED ORDER — ASPIRIN 81 MG PO CHEW
324.0000 mg | CHEWABLE_TABLET | Freq: Once | ORAL | Status: AC
Start: 2015-11-29 — End: 2015-11-29
  Administered 2015-11-29: 324 mg via ORAL
  Filled 2015-11-29: qty 4

## 2015-11-29 MED ORDER — SODIUM CHLORIDE 0.9 % IJ SOLN: 3.0000 mL | Freq: Two times a day (BID) | INTRAMUSCULAR | Status: DC

## 2015-11-29 MED ORDER — FUROSEMIDE 20 MG PO TABS: 20.0000 mg | ORAL_TABLET | Freq: Every day | ORAL | Status: DC

## 2015-11-29 MED ORDER — ACETAMINOPHEN 325 MG PO TABS: 650.0000 mg | ORAL_TABLET | ORAL | Status: DC | PRN

## 2015-11-29 MED ORDER — SODIUM CHLORIDE 0.9 % IV SOLN: 250.0000 mL | INTRAVENOUS | Status: DC | PRN

## 2015-11-29 MED ORDER — SPIRONOLACTONE 25 MG PO TABS
25.0000 mg | ORAL_TABLET | Freq: Every day | ORAL | Status: DC
  Filled 2015-11-29: qty 1

## 2015-11-29 MED ORDER — ASPIRIN EC 325 MG PO TBEC
325.0000 mg | DELAYED_RELEASE_TABLET | Freq: Every day | ORAL | Status: DC
  Administered 2015-11-30: 325 mg via ORAL
  Filled 2015-11-29: qty 1

## 2015-11-29 MED ORDER — LISINOPRIL 5 MG PO TABS
5.0000 mg | ORAL_TABLET | Freq: Every day | ORAL | Status: DC
  Administered 2015-11-30: 5 mg via ORAL
  Filled 2015-11-29: qty 1

## 2015-11-29 MED ORDER — LIVING BETTER WITH HEART FAILURE BOOK: Freq: Once | Status: DC

## 2015-11-29 MED ORDER — SODIUM CHLORIDE 0.9 % IJ SOLN
3.0000 mL | Freq: Two times a day (BID) | INTRAMUSCULAR | Status: DC
  Administered 2015-11-29 – 2015-11-30 (×2): 3 mL via INTRAVENOUS

## 2015-11-29 MED ORDER — CARVEDILOL 12.5 MG PO TABS
12.5000 mg | ORAL_TABLET | Freq: Two times a day (BID) | ORAL | Status: DC
  Administered 2015-11-29 – 2015-11-30 (×2): 12.5 mg via ORAL
  Filled 2015-11-29 (×2): qty 1

## 2015-11-29 MED ORDER — FUROSEMIDE 10 MG/ML IJ SOLN
40.0000 mg | Freq: Once | INTRAMUSCULAR | Status: AC
Start: 2015-11-29 — End: 2015-11-29
  Administered 2015-11-29: 40 mg via INTRAVENOUS
  Filled 2015-11-29: qty 4

## 2015-11-29 MED ORDER — WARFARIN SODIUM 5 MG PO TABS: 5.0000 mg | ORAL_TABLET | Freq: Every day | ORAL | Status: DC

## 2015-11-29 NOTE — Discharge Instructions (Signed)

## 2015-11-29 NOTE — ED Notes (Signed)
Pt has his morning dose of his home medications with him and wants to take them. All meds not available in pyxis. Dr Laneta Simmers notified

## 2015-11-29 NOTE — ED Notes (Signed)
MD at bedside. 

## 2015-11-29 NOTE — ED Provider Notes (Signed)
CSN: [id number]     Arrival date & time 11/29/15  0756 History   First MD Initiated Contact with Patient 11/29/15 0805     Chief Complaint  Patient presents with  . Chest Pain     (Consider location/radiation/quality/duration/timing/severity/associated sxs/prior Treatment) Patient is a 74 y.o. male presenting with chest pain. The history is provided by the patient.  Chest Pain Pain location:  L chest and substernal area Pain quality: pressure   Pain radiates to:  Does not radiate Pain radiates to the back: no   Pain severity:  Moderate Onset quality:  Gradual Duration:  1 day Timing:  Intermittent Progression:  Waxing and waning Chronicity:  New Context: at rest   Relieved by:  Nothing Worsened by:  Nothing tried Ineffective treatments:  None tried Associated symptoms: no AICD problem, no cough, no fever, no lower extremity edema and no shortness of breath   Risk factors: coronary artery disease and hypertension   Risk factors: no smoking   Risk factors comment:  Pacer/AICD placement for arrhythmia   Past Medical History  Diagnosis Date  . Coronary artery disease   . CHF (congestive heart failure) [facility])    Past Surgical History  Procedure Laterality Date  . Implantable cardioverter defibrillator implant     History reviewed. No pertinent family history. Social History  Substance Use Topics  . Smoking status: Former Smoker -- 0.50 packs/day    Types: Cigarettes    Start date: 05/08/1959    Quit date: 05/07/1976  . Smokeless tobacco: None  . Alcohol Use: No    Review of Systems  Constitutional: Negative for fever.  Respiratory: Negative for cough and shortness of breath.   Cardiovascular: Positive for chest pain.  All other systems reviewed and are negative.     Allergies  Review of patient's allergies indicates no known allergies.  Home Medications   Prior to Admission medications   Medication Sig Start Date End Date Taking? Authorizing Provider   aspirin 325 MG tablet Take 325 mg by mouth daily.    Historical Provider, MD  diltiazem (CARDIZEM) 30 MG tablet Take 30 mg by mouth 4 (four) times daily.    Historical Provider, MD  furosemide (LASIX) 20 MG tablet Take 20 mg by mouth.    Historical Provider, MD  Multiple Vitamin (MULTIVITAMIN) tablet Take 1 tablet by mouth daily.    Historical Provider, MD  omeprazole (PRILOSEC) 20 MG capsule Take 1 capsule (20 mg total) by mouth daily. 05/09/15   April Palumbo, MD  spironolactone (ALDACTONE) 25 MG tablet Take 25 mg by mouth daily.    Historical Provider, MD  warfarin (COUMADIN) 5 MG tablet Take 5 mg by mouth daily.    Historical Provider, MD   BP 109/59 mmHg  Pulse 72  Temp(Src) 98 F (36.7 C) (Oral)  Resp 16  Ht 6' 1.5" (1.867 m)  Wt 214 lb (97.07 kg)  BMI 27.85 kg/m2  SpO2 99% Physical Exam  Constitutional: He is oriented to person, place, and time. He appears well-developed and well-nourished. No distress.  HENT:  Head: Normocephalic and atraumatic.  Eyes: Conjunctivae are normal.  Neck: Neck supple. No tracheal deviation present.  Cardiovascular: Normal rate and S1 normal.  An irregular rhythm present. Frequent extrasystoles are present. Exam reveals no S3.   Pulmonary/Chest: Effort normal. No respiratory distress. He has no rales.  Abdominal: Soft. He exhibits no distension.  Musculoskeletal:  No bilateral lower extremity edema noted  Neurological: He is alert and oriented to person,  place, and time.  Skin: Skin is warm and dry.  Psychiatric: He has a normal mood and affect.    ED Course  Procedures (including critical care time) Labs Review Labs Reviewed  CBC - Abnormal; Notable for the following:    WBC 3.7 (*)    Platelets 121 (*)    All other components within normal limits  COMPREHENSIVE METABOLIC PANEL - Abnormal; Notable for the following:    Glucose, Bld 171 (*)    BUN 29 (*)    Creatinine, Ser 1.33 (*)    Calcium 8.6 (*)    GFR calc non Af Amer 51 (*)     GFR calc Af Amer 59 (*)    Anion gap 4 (*)    All other components within normal limits  TROPONIN I - Abnormal; Notable for the following:    Troponin I 0.06 (*)    All other components within normal limits  BRAIN NATRIURETIC PEPTIDE - Abnormal; Notable for the following:    B Natriuretic Peptide 1057.0 (*)    All other components within normal limits  PROTIME-INR - Abnormal; Notable for the following:    Prothrombin Time 31.1 (*)    INR 3.06 (*)    All other components within normal limits  URINALYSIS, ROUTINE W REFLEX MICROSCOPIC (NOT AT [facility])    Imaging Review Dg Chest 2 View  11/29/2015  CLINICAL DATA:  Upper chest discomfort since yesterday EXAM: CHEST  2 VIEW COMPARISON:  May 09, 2015 FINDINGS: The heart size and mediastinal contours are stable. Heart size is enlarged. Cardiac pacemaker is unchanged. Both lungs are clear. The visualized skeletal structures are stable. IMPRESSION: No active cardiopulmonary disease.  Cardiomegaly. Electronically Signed   By: Abelardo Diesel M.D.   On: 11/29/2015 09:21   I have personally reviewed and evaluated these images and lab results as part of my medical decision-making.   EKG Interpretation   Date/Time:  Monday November 29 2015 08:05:53 EST Ventricular Rate:  73 PR Interval:  106 QRS Duration: 194 QT Interval:  500 QTC Calculation: 550 R Axis:   -80 Text Interpretation:  Atrial-sensed ventricular-paced rhythm with frequent  Premature ventricular complexes Abnormal ECG Confirmed by Joanette Silveria MD, Favor Kreh  [id number]) on 11/29/2015 8:45:24 AM      MDM   Final diagnoses:  Chest pain at rest    74 y.o. male presents with left-sided and midsternal mild chest pressure that has been occurring since yesterday. The patient initially thought it might have to do with gas as it was relieved by belching but it has been recurrent and is present currently. He has a significant cardiac history for hypertension and congestive heart failure. Records are not  available for review as patient is seen primarily by Dr. Minna Merritts through [facility]. Patient states last catheterization was in 2011. On his initial EKG and telemetry monitoring there are multiple PACs but no specific ischemic changes. Troponin is detectable and consistent with prior levels. Patient has heart score of 5 and will require observation for ACS rule out. [facility] is currently on diversion so will be unable to accept patient for observation by his primary cardiologist. Hospitalist was consulted for admission and accepted the patient in transfer to facility capable of caring for patient further.   This may represent early sign of CHF exacerbation with elevated BNP, IV Lasix was provided prior to transfer.    Leo Grosser, MD 11/29/15 [phone]

## 2015-11-29 NOTE — Progress Notes (Signed)
Was called by ED physician, dr Laneta Simmers to admit patient who is a 74 year old gentleman history of CHF also history of AICD placement with pacer for arrhythmias presented with left upper midsternal chest pressure per ED physician with has been intermittent in nature. Patient thought it was a gas bubble. First set of troponin 0.06. Patient's cardiologist and doctors are at [facility], however per ED physician [facility] is diverting patients secondary to a bed crunch. EKG showing multiple PVCs. Chest x-ray with cardiomegaly with no active cardiopulmonary disease. Patient accepted to a telemetry bed for chest pain rule out.

## 2015-11-29 NOTE — ED Notes (Signed)
Pt took his own medications: Coreg 12.5mg , Fosinopril 5mg , Warfarin 5mg , Furosemide 30mg  at 1000

## 2015-11-29 NOTE — ED Notes (Signed)
Patient returned from X-ray via stretcher.

## 2015-11-29 NOTE — Progress Notes (Signed)
Patient admitted to room 2W10 alert and oriented x4. Report taken from [place], [place] on [address]. No complaints of pain or discomfort. Will continue to monitor.

## 2015-11-29 NOTE — Progress Notes (Signed)
   11/29/15 1600  Clinical Encounter Type  Visited With Patient  Visit Type Spiritual support  Referral From Nurse  Spiritual Encounters  Spiritual Needs Prayer;Emotional  Stress Factors  Patient Stress Factors Health changes  Chaplain visited patient to provide prayer, spent 30 minutes visiting with him and learning about his life beforehand. Patient in good spirits and received news from hospitalist while I was there that he would probably go home tomorrow.

## 2015-11-29 NOTE — H&P (Signed)
[facility] History and Physical  Spencer Rice [id number] DOB: 05-17-1941 DOA: 11/29/2015  Referring physician: Dr. Laneta Simmers - [facility] PCP: [name], MD   Chief Complaint: Chest tightness  HPI: Spencer Rice is a 75 y.o. male  L chest tightness and sluggishness Pt states this is how he typically feels when he has too much fluid on his heart.  Associated w/ HA, nasuea. Thought it was indigestion or gas initially Unable to eat his normal amount of food.  Started one day ago. nexium w/ some improvement yesterday.  Constant w/ waxing and waning nature.  Worse w/ exertion  Feels improved after increased lasix dose at [facility]  Denies personally swelling, shortness of breath, palpitations.  Review of Systems:  Constitutional:  No weight loss, night sweats, Fevers, chills, HEENT: No headaches, Difficulty swallowing,Tooth/dental problems,Sore throat, Cardio-vascular:  No chest pain, Orthopnea, PND, swelling in lower extremities, anasarca, dizziness, palpitations  GI:  No heartburn, indigestion, abdominal pain, nausea, vomiting, diarrhea, change in bowel habits, loss of appetite  Resp:   No shortness of breath with exertion or at rest. No excess mucus, no productive cough, No non-productive cough, No coughing up of blood.No change in color of mucus.No wheezing.No chest wall deformity  Skin:  no rash or lesions.  GU:  no dysuria, change in color of urine, no urgency or frequency. No flank pain.  Musculoskeletal:   No joint pain or swelling. No decreased range of motion. No back pain.  Psych:  No change in mood or affect. No depression or anxiety. No memory loss.  Neuro:  No change in sensation, unilateral strength, or cognitive abilities  All other systems were reviewed and are negative.  Past Medical History  Diagnosis Date  . Coronary artery disease   . CHF (congestive heart failure) ([place])   . AICD (automatic cardioverter/defibrillator) present   .  Hypertension   . Irregular heart beat   . DVT (deep venous thrombosis) (HCC) 1983    LLE  . History of hiatal hernia   . GERD (gastroesophageal reflux disease)   . Cancer of left kidney ([place])     S/P OR 05/2014   Past Surgical History  Procedure Laterality Date  . Implantable cardioverter defibrillator implant  07/21/2010  . Cardiac catheterization  1980's  . Renal cryoablation Left 05/2014   Social History:  reports that he quit smoking about 39 years ago. His smoking use included Cigarettes. He started smoking about 56 years ago. He has a 12.75 pack-year smoking history. He does not have any smokeless tobacco history on file. He reports that he drinks alcohol. He reports that he does not use illicit drugs.  No Known Allergies  Family History  Problem Relation Age of Onset  . Heart disease Father   . Heart attack Father 38  . Hypertension Father   . Hypertension Mother      Prior to Admission medications   Medication Sig Start Date End Date Taking? Authorizing Provider  aspirin 325 MG tablet Take 325 mg by mouth daily.   Yes Historical Provider, MD  carvedilol (COREG) 12.5 MG tablet Take 12.5 mg by mouth 2 (two) times daily with a meal.    Yes Historical Provider, MD  fosinopril (MONOPRIL) 10 MG tablet Take 5 mg by mouth daily.   Yes Historical Provider, MD  furosemide (LASIX) 20 MG tablet Take 30 mg by mouth.    Yes Historical Provider, MD  Multiple Vitamin (MULTIVITAMIN) tablet Take 1 tablet by mouth daily.   Yes Historical  Provider, MD  omeprazole (PRILOSEC) 20 MG capsule Take 1 capsule (20 mg total) by mouth daily. Patient taking differently: Take 20 mg by mouth as needed.  05/09/15  Yes April Palumbo, MD  warfarin (COUMADIN) 5 MG tablet Take 5 mg by mouth daily.   Yes Historical Provider, MD   Physical Exam: Filed Vitals:   11/29/15 1130 11/29/15 1155 11/29/15 1157 11/29/15 1257  BP: 128/77  118/88 110/72  Pulse: 57 57  57  Temp:    97.7 F (36.5 C)  TempSrc:    Oral    Resp: 25 17 25 18   Height:    6\' 1"  (1.854 m)  Weight:    100.4 kg (221 lb 5.5 oz)  SpO2: 100% 100%  100%    Wt Readings from Last 3 Encounters:  11/29/15 100.4 kg (221 lb 5.5 oz)  05/09/15 99.791 kg (220 lb)  04/06/15 98.884 kg (218 lb)    General:  Appears calm and comfortable Eyes:  PERRL, EOMI, normal lids, iris ENT:  grossly normal hearing, lips & tongue Neck:  no LAD, masses or thyromegaly Cardiovascular:  RRR, no m/r/g. No LE edema.  Respiratory: slightly diminished in bases bilat. no w/r/r. Normal respiratory effort. Abdomen:  soft, ntnd Skin:  no rash or induration seen on limited exam Musculoskeletal:  grossly normal tone BUE/BLE Psychiatric:  grossly normal mood and affect, speech fluent and appropriate Neurologic:  CN 2-12 grossly intact, moves all extremities in coordinated fashion.          Labs on Admission:  Basic Metabolic Panel:  Recent Labs Lab 11/29/15 0815  NA 140  K 3.9  CL 110  CO2 26  GLUCOSE 171*  BUN 29*  CREATININE 1.33*  CALCIUM 8.6*   Liver Function Tests:  Recent Labs Lab 11/29/15 0815  AST 28  ALT 19  ALKPHOS 64  BILITOT 0.5  PROT 6.5  ALBUMIN 3.5   No results for input(s): LIPASE, AMYLASE in the last 168 hours. No results for input(s): AMMONIA in the last 168 hours. CBC:  Recent Labs Lab 11/29/15 0815  WBC 3.7*  HGB 14.0  HCT 41.5  MCV 84.0  PLT 121*   Cardiac Enzymes:  Recent Labs Lab 11/29/15 0815  TROPONINI 0.06*    BNP (last 3 results)  Recent Labs  04/12/15 2020 05/09/15 0443 11/29/15 0815  BNP 2410.5* 2422.4* 1057.0*    ProBNP (last 3 results) No results for input(s): PROBNP in the last 8760 hours.   CREATININE: 1.33 mg/dL ABNORMAL (11/29/15 0815) Estimated creatinine clearance - 60.7 mL/min  CBG: No results for input(s): GLUCAP in the last 168 hours.  Radiological Exams on Admission: Dg Chest 2 View  11/29/2015  CLINICAL DATA:  Upper chest discomfort since yesterday EXAM: CHEST  2  VIEW COMPARISON:  May 09, 2015 FINDINGS: The heart size and mediastinal contours are stable. Heart size is enlarged. Cardiac pacemaker is unchanged. Both lungs are clear. The visualized skeletal structures are stable. IMPRESSION: No active cardiopulmonary disease.  Cardiomegaly. Electronically Signed   By: Abelardo Diesel M.D.   On: 11/29/2015 09:21      Assessment/Plan Active Problems:   Chest pain at rest   Essential hypertension   Acute on chronic systolic (congestive) heart failure (HCC)   GERD (gastroesophageal reflux disease)   CP: likely from CHF decompensation vs ACS. H/o CAD s/p cath adn AICD placement. EKG showing paced rhythm w/o clear evidence of ACS. Resolved. Trop neg. HEART score 5. - tele - cycle trop -  GI cocktail - EKG in am - Continue ASA, coumadin  Acute on chronic CHF: No previous echo as pt typically goes to [facility] for medical care. Improved after increased lasix in ED.  - continue bblocker, ACEi - Increase lasix to IV 20mg  (home 20mg  po) - strict I/O - dly wts  HTN:  - continue coreg and fosinopril  GERD: - continue ppi  Code Status: FULL  DVT Prophylaxis: coumadin Family Communication: none Disposition Plan: Pending Improvement - OBSERVATION    Nikai Quest [name], MD Family Medicine [facility] [email or web address] Password [email or web address]

## 2015-11-29 NOTE — Progress Notes (Signed)
ANTICOAGULATION CONSULT NOTE - Initial Consult  Pharmacy Consult for Coumadin Indication: atrial fibrillation  No Known Allergies  Patient Measurements: Height: 6\' 1"  (185.4 cm) Weight: 221 lb 5.5 oz (100.4 kg) IBW/kg (Calculated) : 79.9  Vital Signs: Temp: 97.7 F (36.5 C) (12/26 1257) Temp Source: Oral (12/26 1257) BP: 101/50 mmHg (12/26 1800) Pulse Rate: 57 (12/26 1800)  Labs:  Recent Labs  11/29/15 0815 11/29/15 1805  HGB 14.0  --   HCT 41.5  --   PLT 121*  --   LABPROT 31.1*  --   INR 3.06*  --   CREATININE 1.33*  --   TROPONINI 0.06* 0.04*    Estimated Creatinine Clearance: 60.7 mL/min (by C-G formula based on Cr of 1.33).   Medical History: Past Medical History  Diagnosis Date  . Coronary artery disease   . CHF (congestive heart failure) ([place])   . AICD (automatic cardioverter/defibrillator) present   . Hypertension   . Irregular heart beat   . DVT (deep venous thrombosis) (HCC) 1983    LLE  . History of hiatal hernia   . GERD (gastroesophageal reflux disease)   . Cancer of left kidney ([place])     S/P OR 05/2014    Medications:  Prescriptions prior to admission  Medication Sig Dispense Refill Last Dose  . aspirin 325 MG tablet Take 325 mg by mouth daily.   11/29/2015 at Unknown time  . carvedilol (COREG) 12.5 MG tablet Take 12.5 mg by mouth 2 (two) times daily with a meal.    11/29/2015 at 0700  . esomeprazole (NEXIUM) 20 MG capsule Take 20 mg by mouth daily as needed (FOR HEARTBURN).   Past Month at Unknown time  . fosinopril (MONOPRIL) 10 MG tablet Take 5 mg by mouth daily.   11/29/2015 at 1000  . furosemide (LASIX) 20 MG tablet Take 30 mg by mouth daily.    11/29/2015 at 1000  . warfarin (COUMADIN) 5 MG tablet Take 5 mg by mouth daily.   11/29/2015 at 1000    Assessment: 74 yo M presented to [facility] with chest tightness. Pt states this is how he usually feels when he has too much fluid. Rec'd Lasix x 1 at [facility] with improvement. Admitted for  observation.  On Coumadin PTA with INR 3.06.  Per notes, patient took his own dose today at [facility]. No further Coumadin tonight. Daily INR.  Goal of Therapy:  INR 2-3 Monitor platelets by anticoagulation protocol: Yes   Plan:  No Coumadin tonight Daily INR Decrease ASA to 81mg  daily while on Coumadin?  [employer], Pharm.D., BCPS Clinical Pharmacist Pager [phone] 11/29/2015 7:32 PM

## 2015-11-29 NOTE — ED Notes (Signed)
Pt reports pain to upper left chest area yesterday am that he describes as a what he thought was a gas bubble, states he went to church and pain was decreased but as day went on he got lightheaded, at this time pt is continuing to have "gas bubble" in upper chest

## 2015-11-29 NOTE — ED Notes (Addendum)
Per VORB from Dr Laneta Simmers pt may take his home medications he has with him. Medications were identified using online pill identifier and verified with patient. Med rec list updated to reflect changes

## 2015-11-30 ENCOUNTER — Observation Stay (HOSPITAL_BASED_OUTPATIENT_CLINIC_OR_DEPARTMENT_OTHER): Payer: Medicare Other

## 2015-11-30 ENCOUNTER — Encounter (HOSPITAL_COMMUNITY): Payer: Self-pay | Admitting: Physician Assistant

## 2015-11-30 DIAGNOSIS — R0789 Other chest pain: Secondary | ICD-10-CM | POA: Diagnosis not present

## 2015-11-30 DIAGNOSIS — I10 Essential (primary) hypertension: Secondary | ICD-10-CM | POA: Diagnosis not present

## 2015-11-30 DIAGNOSIS — R079 Chest pain, unspecified: Secondary | ICD-10-CM

## 2015-11-30 DIAGNOSIS — I5023 Acute on chronic systolic (congestive) heart failure: Secondary | ICD-10-CM | POA: Diagnosis not present

## 2015-11-30 DIAGNOSIS — I110 Hypertensive heart disease with heart failure: Secondary | ICD-10-CM | POA: Diagnosis not present

## 2015-11-30 DIAGNOSIS — R0600 Dyspnea, unspecified: Secondary | ICD-10-CM

## 2015-11-30 DIAGNOSIS — K219 Gastro-esophageal reflux disease without esophagitis: Secondary | ICD-10-CM

## 2015-11-30 LAB — COMPREHENSIVE METABOLIC PANEL
ALK PHOS: 56 U/L (ref 38–126)
ALT: 16 U/L — ABNORMAL LOW (ref 17–63)
ANION GAP: 10 (ref 5–15)
AST: 34 U/L (ref 15–41)
Albumin: 3.4 g/dL — ABNORMAL LOW (ref 3.5–5.0)
BILIRUBIN TOTAL: 1.1 mg/dL (ref 0.3–1.2)
BUN: 27 mg/dL — ABNORMAL HIGH (ref 6–20)
CALCIUM: 9.3 mg/dL (ref 8.9–10.3)
CO2: 27 mmol/L (ref 22–32)
Chloride: 103 mmol/L (ref 101–111)
Creatinine, Ser: 1.48 mg/dL — ABNORMAL HIGH (ref 0.61–1.24)
GFR calc non Af Amer: 45 mL/min — ABNORMAL LOW (ref >60)
GFR, EST AFRICAN AMERICAN: 52 mL/min — AB (ref >60)
Glucose, Bld: 102 mg/dL — ABNORMAL HIGH (ref 65–99)
POTASSIUM: 4.6 mmol/L (ref 3.5–5.1)
SODIUM: 140 mmol/L (ref 135–145)
Total Protein: 5.9 g/dL — ABNORMAL LOW (ref 6.5–8.1)

## 2015-11-30 LAB — CBC
HCT: 43.1 % (ref 39.0–52.0)
HEMOGLOBIN: 14.3 g/dL (ref 13.0–17.0)
MCH: 28.4 pg (ref 26.0–34.0)
MCHC: 33.2 g/dL (ref 30.0–36.0)
MCV: 85.5 fL (ref 78.0–100.0)
Platelets: 135 10*3/uL — ABNORMAL LOW (ref 150–400)
RBC: 5.04 MIL/uL (ref 4.22–5.81)
RDW: 13.8 % (ref 11.5–15.5)
WBC: 5.3 10*3/uL (ref 4.0–10.5)

## 2015-11-30 LAB — TROPONIN I: Troponin I: 0.05 ng/mL — ABNORMAL HIGH (ref <0.031)

## 2015-11-30 LAB — PROTIME-INR
INR: 2.75 — ABNORMAL HIGH (ref 0.00–1.49)
Prothrombin Time: 28.6 seconds — ABNORMAL HIGH (ref 11.6–15.2)

## 2015-11-30 MED ORDER — WARFARIN - PHARMACIST DOSING INPATIENT: Freq: Every day | Status: DC

## 2015-11-30 MED ORDER — FUROSEMIDE 20 MG PO TABS: 30.0000 mg | ORAL_TABLET | Freq: Every morning | ORAL | Status: DC

## 2015-11-30 MED ORDER — WARFARIN SODIUM 5 MG PO TABS
5.0000 mg | ORAL_TABLET | Freq: Every day | ORAL | Status: DC
Start: 2015-11-30 — End: 2015-11-30

## 2015-11-30 MED ORDER — FUROSEMIDE 40 MG PO TABS: 40.0000 mg | ORAL_TABLET | Freq: Two times a day (BID) | ORAL | Status: DC

## 2015-11-30 MED ORDER — FUROSEMIDE 20 MG PO TABS: 20.0000 mg | ORAL_TABLET | Freq: Every evening | ORAL | Status: DC

## 2015-11-30 NOTE — Progress Notes (Signed)
[facility] PROGRESS NOTE  Spencer Rice [id number] DOB: 05/18/1941 DOA: 11/29/2015 PCP: [name], MD  Length of stay:    Assessment/Plan: Active Problems:   Chest pain at rest   Essential hypertension   Acute on chronic systolic (congestive) heart failure (HCC)   GERD (gastroesophageal reflux disease)   History of present illness 74 y.o. male presents with left-sided and midsternal mild chest pressure that has been occurring since yesterday. The patient initially thought it might have to do with gas as it was relieved by belching but it has been recurrent and is present currently. He has a significant cardiac history for hypertension and congestive heart failure. Records are not available for review as patient is seen primarily by Dr. Minna Merritts through [facility]. Patient states last catheterization was in 2011. On his initial EKG and telemetry monitoring there are multiple PACs but no specific ischemic changes. Troponin is detectable and consistent with prior levels. Patient has heart score of 5 and will require observation for ACS rule out. [place] [facility] is currently on diversion so will be unable to accept patient for observation by his primary cardiologist. Hospitalist was consulted for admission and accepted the patient in transfer to facility capable of caring for patient further. Apparently the patient also has history of renal cell carcinoma. According to [facility] records the patient has a history of acute on chronic systolic heart failure, nonischemic, has an ICD, coronary artery disease, last cardiac cath 2008, diabetes mellitus type 2,  Assessment and plan CP: likely from CHF decompensation vs ACS. H/o CAD s/p cath adn AICD placement. EKG showing paced rhythm w/o clear evidence of ACS. Resolved. Trop neg. HEART score 5. - tele Troponin abnormal, but flat - GI cocktail - EKG in am - Continue ASA, coumadin Cardiology consultation for further  recommendations Patient is on anticoagulation and was therapeutic, doubt PE  Acute on chronic CHF: No previous echo as pt typically goes to [facility] for medical care. Improved after increased lasix in ED. , patient has an ICD - continue bblocker, ACEi - Increase lasix to IV 20mg  (home 20mg  po) 2-D echo to rule out wall motion abnormalities  HTN:  - continue coreg and fosinopril  GERD: - continue ppi   DVT prophylaxsis Coumadin  Code Status:      Code Status Orders        Start     Ordered   11/29/15 1717  Full code   Continuous     11/29/15 1718      Family Communication: Discussed in detail with the patient, all imaging results, lab results explained to the patient   Disposition Plan:  As above      Consultants:  cardiology   Procedures none  Antibiotics: Anti-infectives    None         HPI/Subjective: Chest pain free , no cough , no fever   Objective: Filed Vitals:   11/29/15 1800 11/29/15 2133 11/30/15 0141 11/30/15 0526  BP: 101/50 95/52 105/67 114/59  Pulse: 57 53 59 56  Temp:  97.9 F (36.6 C)  98.4 F (36.9 C)  TempSrc:  Oral  Oral  Resp:  18 18 18   Height:      Weight:    95.9 kg (211 lb 6.7 oz)  SpO2:  97% 96% 99%    Intake/Output Summary (Last 24 hours) at 11/30/15 0614 Last data filed at 11/30/15 0541  Gross per 24 hour  Intake  560 ml  Output   2465 ml  Net  -1905 ml    Exam:  General: No acute respiratory distress Lungs: Clear to auscultation bilaterally without wheezes or crackles Cardiovascular: Regular rate and rhythm without murmur gallop or rub normal S1 and S2 Abdomen: Nontender, nondistended, soft, bowel sounds positive, no rebound, no ascites, no appreciable mass Extremities: No significant cyanosis, clubbing, or edema bilateral lower extremities     Data Review   Micro Results No results found for this or any previous visit (from the past 240 hour(s)).  Radiology Reports Dg Chest 2 View  11/29/2015   CLINICAL DATA:  Upper chest discomfort since yesterday EXAM: CHEST  2 VIEW COMPARISON:  May 09, 2015 FINDINGS: The heart size and mediastinal contours are stable. Heart size is enlarged. Cardiac pacemaker is unchanged. Both lungs are clear. The visualized skeletal structures are stable. IMPRESSION: No active cardiopulmonary disease.  Cardiomegaly. Electronically Signed   By: Abelardo Diesel M.D.   On: 11/29/2015 09:21     CBC  Recent Labs Lab 11/29/15 0815 11/30/15 0005  WBC 3.7* 5.3  HGB 14.0 14.3  HCT 41.5 43.1  PLT 121* 135*  MCV 84.0 85.5  MCH 28.3 28.4  MCHC 33.7 33.2  RDW 13.8 13.8    Chemistries   Recent Labs Lab 11/29/15 0815 11/30/15 0005  NA 140 140  K 3.9 4.6  CL 110 103  CO2 26 27  GLUCOSE 171* 102*  BUN 29* 27*  CREATININE 1.33* 1.48*  CALCIUM 8.6* 9.3  AST 28 34  ALT 19 16*  ALKPHOS 64 56  BILITOT 0.5 1.1   ------------------------------------------------------------------------------------------------------------------ estimated creatinine clearance is 53.5 mL/min (by C-G formula based on Cr of 1.48). ------------------------------------------------------------------------------------------------------------------ No results for input(s): HGBA1C in the last 72 hours. ------------------------------------------------------------------------------------------------------------------ No results for input(s): CHOL, HDL, LDLCALC, TRIG, CHOLHDL, LDLDIRECT in the last 72 hours. ------------------------------------------------------------------------------------------------------------------ No results for input(s): TSH, T4TOTAL, T3FREE, THYROIDAB in the last 72 hours.  Invalid input(s): FREET3 ------------------------------------------------------------------------------------------------------------------ No results for input(s): VITAMINB12, FOLATE, FERRITIN, TIBC, IRON, RETICCTPCT in the last 72 hours.  Coagulation profile  Recent Labs Lab 11/29/15 0815  11/30/15 0005  INR 3.06* 2.75*    No results for input(s): DDIMER in the last 72 hours.  Cardiac Enzymes  Recent Labs Lab 11/29/15 1805 11/29/15 2130 11/30/15 0005  TROPONINI 0.04* 0.05* 0.05*   ------------------------------------------------------------------------------------------------------------------ Invalid input(s): POCBNP   CBG: No results for input(s): GLUCAP in the last 168 hours.     Studies: Dg Chest 2 View  11/29/2015  CLINICAL DATA:  Upper chest discomfort since yesterday EXAM: CHEST  2 VIEW COMPARISON:  May 09, 2015 FINDINGS: The heart size and mediastinal contours are stable. Heart size is enlarged. Cardiac pacemaker is unchanged. Both lungs are clear. The visualized skeletal structures are stable. IMPRESSION: No active cardiopulmonary disease.  Cardiomegaly. Electronically Signed   By: Abelardo Diesel M.D.   On: 11/29/2015 09:21      No results found for: HGBA1C Lab Results  Component Value Date   CREATININE 1.48* 11/30/2015       Scheduled Meds: . aspirin EC  325 mg Oral Daily  . carvedilol  12.5 mg Oral BID WC  . lisinopril  5 mg Oral Daily  . Living Better with Heart Failure Book   Does not apply Once  . pantoprazole  40 mg Oral Daily  . sodium chloride  3 mL Intravenous Q12H  . sodium chloride  3 mL Intravenous Q12H   Continuous Infusions:   Active  Problems:   Chest pain at rest   Essential hypertension   Acute on chronic systolic (congestive) heart failure (HCC)   GERD (gastroesophageal reflux disease)    Time spent: 45 minutes   [facility] Pager [phone]. If 7PM-7AM, please contact night-coverage at [email or web address], password [facility] 11/30/2015, 6:14 AM

## 2015-11-30 NOTE — Progress Notes (Signed)
Patient discharged home with son. IV was d'cd and intact. RN walked him out. Discharge instructions and medications were educated on and he stated he understood.

## 2015-11-30 NOTE — Care Management Note (Addendum)
Case Management Note  Patient Details  Name: Challis Baldini MRN: [id number] Date of Birth: [birth year]  Subjective/Objective:     Pt admitted with chest pain at rest               Action/Plan:  Pt is from home independent with wife.  Pt is ambulatory in room without assistance.  Pt states he has a scale; weighs himself before and after dinner.  Pt states he adheres to low salt diet.  Pt son at bedside during assessment.    Expected Discharge Date:                  Expected Discharge Plan:  Home/Self Care  In-House Referral:     Discharge planning Services  CM Consult  Post Acute Care Choice:    Choice offered to:     DME Arranged:    DME Agency:     HH Arranged:    HH Agency:     Status of Service:  In process, will continue to follow  Medicare Important Message Given:    Date Medicare IM Given:    Medicare IM give by:    Date Additional Medicare IM Given:    Additional Medicare Important Message give by:     If discussed at [place] of Stay Meetings, dates discussed:    Additional Comments:  Maryclare Labrador, RN 11/30/2015, 12:07 PM

## 2015-11-30 NOTE — Consult Note (Signed)
CARDIOLOGY CONSULT NOTE   Patient ID: Spencer Rice MRN: [id number] DOB/AGE: Oct 30, 1941 74 y.o.  Admit date: 11/29/2015  Consulting physician: Dr. Allyson Sabal Primary Physician   [name], MD Primary Cardiologist   Dr. Minna Merritts @ [facility]  Reason for Consultation   Chest pain  HPI: Spencer Rice is a 74 y.o. male with a history of cardiomyopathy s/p BIV ICD, Left renal cell carcinoma s/p cryoablation, CAD, chronic systolic CHF (EF [id number]), HTN, HLD and previous DVT on coumadin who presented to [facility] 11/19/15 for chest fullness.  He has a history of interpolar renal mass biopsy proven to be RCC Grade 2. S/p cryoablation 06/2014 FU CT abd 12/23/14 and 06/2015 shows no evidence of persistent/recurrent tumor.  He is followed closely by Dr. Minna Merritts in [facility]. He has a long history of a "weak heart" but cannot tell me his last EF. He had a BIV ICD placed in 2011. He had a heart catheterization at that time. He reports that he never had any blockages or stents. Per CARE EVERYWHERE: he had VF episode that terminated without a shock in [place] 2016.   He had symptoms reminiscent of his previous volume overload and presented to [facility] yesterday. He was given IV lasix with complete resolution of his symptoms. He thought he would just go home but they wanted to keep him overnight for observation. There were no available beds at [facility] and he was sent here.   Labs: BNP noted to be slightly elevated at 1057. Troponin mildly elevated and flat 0.06--> 0.04--> 0.05 --> 0.05. CXR with no active cardiopulmonary disease.   He denies any chest pain and is adamant about this. He only felt "congested" and fatigued, which if very typical of his CHF exaserbations. Ever since the IV lasix in the ER, he has felt completely better. He denies SOB, orthopnea or PND. No dizziness or syncope. No chest pain. He is ready to go home.     Past Medical History  Diagnosis Date  . Coronary artery disease   . CHF (congestive heart  failure) ([place])   . AICD (automatic cardioverter/defibrillator) present   . Hypertension   . Irregular heart beat   . DVT (deep venous thrombosis) (HCC) 1983    LLE  . History of hiatal hernia   . GERD (gastroesophageal reflux disease)   . Cancer of left kidney ([place])     S/P OR 05/2014     Past Surgical History  Procedure Laterality Date  . Implantable cardioverter defibrillator implant  07/21/2010  . Cardiac catheterization  1980's  . Renal cryoablation Left 05/2014    No Known Allergies  I have reviewed the patient's current medications . aspirin EC  325 mg Oral Daily  . carvedilol  12.5 mg Oral BID WC  . lisinopril  5 mg Oral Daily  . Living Better with Heart Failure Book   Does not apply Once  . pantoprazole  40 mg Oral Daily  . sodium chloride  3 mL Intravenous Q12H  . sodium chloride  3 mL Intravenous Q12H     sodium chloride, acetaminophen, gi cocktail, ondansetron **OR** ondansetron (ZOFRAN) IV  Prior to Admission medications   Medication Sig Start Date End Date Taking? Authorizing Provider  aspirin 325 MG tablet Take 325 mg by mouth daily.   Yes Historical Provider, MD  carvedilol (COREG) 12.5 MG tablet Take 12.5 mg by mouth 2 (two) times daily with a meal.    Yes Historical Provider, MD  esomeprazole (  NEXIUM) 20 MG capsule Take 20 mg by mouth daily as needed (FOR HEARTBURN).   Yes Historical Provider, MD  fosinopril (MONOPRIL) 10 MG tablet Take 5 mg by mouth daily.   Yes Historical Provider, MD  furosemide (LASIX) 20 MG tablet Take 30 mg by mouth daily.    Yes Historical Provider, MD  warfarin (COUMADIN) 5 MG tablet Take 5 mg by mouth daily.   Yes Historical Provider, MD     Social History   Social History  . Marital Status: Married    Spouse Name: N/A  . Number of Children: N/A  . Years of Education: N/A   Occupational History  . Not on file.   Social History Main Topics  . Smoking status: Former Smoker -- 0.75 packs/day for 17 years    Types: Cigarettes     Start date: 05/08/1959    Quit date: 06/20/1976  . Smokeless tobacco: Not on file  . Alcohol Use: Yes     Comment: 11/29/2015 "stopped drinking in 1977"  . Drug Use: No  . Sexual Activity: Yes   Other Topics Concern  . Not on file   Social History Narrative    No family status information on file.   Family History  Problem Relation Age of Onset  . Heart disease Father   . Heart attack Father 46  . Hypertension Father   . Hypertension Mother      ROS:  Full 14 point review of systems complete and found to be negative unless listed above.  Physical Exam: Blood pressure 120/80, pulse 76, temperature 98.4 F (36.9 C), temperature source Oral, resp. rate 18, height 6\' 1"  (1.854 m), weight 211 lb 6.7 oz (95.9 kg), SpO2 99 %.  General: Well developed, well nourished, male in no acute distress Head: Eyes PERRLA, No xanthomas.   Normocephalic and atraumatic, oropharynx without edema or  Lungs: CTAB Heart: HRRR S1 S2, no rub/gallop, Heart regular rate and rhythm with S1, S2  murmur. pulses are 2+ extrem.   Neck: No carotid bruits. No lymphadenopathy.  No JVD. Abdomen: Bowel sounds present, abdomen soft and non-tender without masses or hernias noted. Msk:  No spine or cva tenderness. No weakness, no joint deformities or effusions. Extremities: No clubbing or cyanosis.  No edema.  Neuro: Alert and oriented X 3. No focal deficits noted. Psych:  Good affect, responds appropriately Skin: No rashes or lesions noted.  Labs:  Lab Results  Component Value Date   WBC 5.3 11/30/2015   HGB 14.3 11/30/2015   HCT 43.1 11/30/2015   MCV 85.5 11/30/2015   PLT 135* 11/30/2015    Recent Labs  11/30/15 0005  INR 2.75*    Recent Labs Lab 11/30/15 0005  NA 140  K 4.6  CL 103  CO2 27  BUN 27*  CREATININE 1.48*  CALCIUM 9.3  PROT 5.9*  BILITOT 1.1  ALKPHOS 56  ALT 16*  AST 34  GLUCOSE 102*  ALBUMIN 3.4*   No results found for: MG  Recent Labs  11/29/15 0815  11/29/15 1805 11/29/15 2130 11/30/15 0005  TROPONINI 0.06* 0.04* 0.05* 0.05*   No results for input(s): TROPIPOC in the last 72 hours. PRO B NATRIURETIC PEPTIDE (BNP)  Date/Time Value Ref Range Status  10/23/2014 02:50 AM 4646.0* 0 - 125 pg/mL Final   No results found for: CHOL, HDL, LDLCALC, TRIG Lab Results  Component Value Date   DDIMER 0.37 05/09/2015    Echo: Study Date: 11/30/2015 Study Conclusions - Left ventricle:  The cavity size was mildly dilated. Wall   thickness was increased in a pattern of mild LVH. Systolic   function was severely reduced. The estimated ejection fraction   was 10%. Diffuse hypokinesis. There is akinesis of the   inferolateral myocardium. Doppler parameters are consistent with   abnormal left ventricular relaxation (grade 1 diastolic   dysfunction). - Mitral valve: There was mild regurgitation. - Left atrium: The atrium was moderately dilated. - Right ventricle: The cavity size was mildly dilated. - Right atrium: The atrium was mildly dilated. - Pulmonary arteries: Systolic pressure was mildly increased. PA   peak pressure: 31 mm Hg (S). Impressions: - Severe LV dysfuncton; grade 1 diastlolic dysfunction; mild LVE;   biatrial enlargement; mild MR; mildly elevated pulmonary   pressure; cannot R/O apical thrombus; suggest fu limited study   with definity.   ECG:  A sensed V paced.   Radiology:  Dg Chest 2 View  11/29/2015  CLINICAL DATA:  Upper chest discomfort since yesterday EXAM: CHEST  2 VIEW COMPARISON:  May 09, 2015 FINDINGS: The heart size and mediastinal contours are stable. Heart size is enlarged. Cardiac pacemaker is unchanged. Both lungs are clear. The visualized skeletal structures are stable. IMPRESSION: No active cardiopulmonary disease.  Cardiomegaly. Electronically Signed   By: Abelardo Diesel M.D.   On: 11/29/2015 09:21    ASSESSMENT AND PLAN:    Active Problems:   Chest pain at rest   Essential hypertension   Acute on  chronic systolic (congestive) heart failure (HCC)   GERD (gastroesophageal reflux disease)  Spencer Rice is a 74 y.o. male with a history of cardiomyopathy s/p BIV ICD, Left renal cell carcinoma s/p cryoablation, CAD, chronic systolic CHF (EF [id number]), HTN, HLD and previous DVT on coumadin who presented to [facility] 11/19/15 for chest fullness.  Acute on chronic systolic CHF:  -- BNP elevated, but CXR clear. Given IV lasix 40mg  x1. Net neg 1.9L.  -- 2D ECHO 11/30/15: EF 10%. G1DD, mild LVE, biatrial enlargement, mild MR, mildly elevated pulmonary pressure. -- Continue ACE and BB. Would resume his home dose of lasix 30mg  poqd.   Elevated troponin: mildly elevated and flat 0.06--> 0.04--> 0.05 --> 0.05 c/w demand ischemia.   Possible apical thrombus: 2D ECHO "cannot R/O apical thrombus; suggest fu limited study with definity." Doubt thrombus with therapeutic INR. MD to see.   Hx of DVT: on chronic coumadin. INR 3.06--> 2.75  CKD: creat 1.48. S/p RCC and cryoablation therapy.   HTN: BP well controlled on current regimen   Dispo: likely okay to go home today with close follow up with Dr. Minna Merritts. I personally called his office. He has a device check on 12/06/14 and i scheduled a f/u with Dr. Minna Merritts on 12/09/14.   SignedCrista Luria 11/30/2015 10:32 AM  Pager [phone]  Co-Sign MD   Personally seen and examined. Agree with above.  Looks good. OK to DC home Has appt in Jan 6 with his cardiologist I would like him to take lasix 40mg  PO BID for 3 days then go back to his home dose of 30am, 20pm  No edema Lungs clear. Felt much better after IV lasix in ER.  No salt.  EF 10% unchanged.  On coumadin (treating for possible apical thrombus regardless-doubt with therapeutic INR).  Candee Furbish, MD

## 2015-11-30 NOTE — Progress Notes (Signed)
[place] for Coumadin Indication: atrial fibrillation  No Known Allergies  Patient Measurements: Height: 6\' 1"  (185.4 cm) Weight: 211 lb 6.7 oz (95.9 kg) IBW/kg (Calculated) : 79.9  Vital Signs: Temp: 98.4 F (36.9 C) (12/27 0526) Temp Source: Oral (12/27 0526) BP: 120/80 mmHg (12/27 0940) Pulse Rate: 76 (12/27 0940)  Labs:  Recent Labs  11/29/15 0815 11/29/15 1805 11/29/15 2130 11/30/15 0005  HGB 14.0  --   --  14.3  HCT 41.5  --   --  43.1  PLT 121*  --   --  135*  LABPROT 31.1*  --   --  28.6*  INR 3.06*  --   --  2.75*  CREATININE 1.33*  --   --  1.48*  TROPONINI 0.06* 0.04* 0.05* 0.05*    Estimated Creatinine Clearance: 53.5 mL/min (by C-G formula based on Cr of 1.48).   Medical History: Past Medical History  Diagnosis Date  . Coronary artery disease   . CHF (congestive heart failure) ([place])   . AICD (automatic cardioverter/defibrillator) present   . Hypertension   . DVT (deep venous thrombosis) (HCC) 1983    LLE  . History of hiatal hernia   . GERD (gastroesophageal reflux disease)   . Cancer of left kidney ([place])     S/P OR 05/2014    Medications:  Prescriptions prior to admission  Medication Sig Dispense Refill Last Dose  . aspirin 325 MG tablet Take 325 mg by mouth daily.   11/29/2015 at Unknown time  . carvedilol (COREG) 12.5 MG tablet Take 12.5 mg by mouth 2 (two) times daily with a meal.    11/29/2015 at 0700  . esomeprazole (NEXIUM) 20 MG capsule Take 20 mg by mouth daily as needed (FOR HEARTBURN).   Past Month at Unknown time  . fosinopril (MONOPRIL) 10 MG tablet Take 5 mg by mouth daily.   11/29/2015 at 1000  . warfarin (COUMADIN) 5 MG tablet Take 5 mg by mouth daily.   11/29/2015 at 1000  . [DISCONTINUED] furosemide (LASIX) 20 MG tablet Take 30 mg by mouth daily.    11/29/2015 at 1000    Assessment: On Coumadin PTA with INR  2.75 today. PLCT low 121>135, H/H WNL.  No bleeding reported.   Goal of  Therapy:  INR 2-3 Monitor platelets by anticoagulation protocol: Yes   Plan:  Resume home coumadin 5 mg po dailyl Decrease ASA to 81mg  daily while on Coumadin? Eudelia Bunch, Pharm.D. [id number] 11/30/2015 3:10 PM

## 2015-11-30 NOTE — Progress Notes (Signed)
  Echocardiogram 2D Echocardiogram has been performed.  Spencer Rice 11/30/2015, 8:56 AM

## 2015-11-30 NOTE — Care Management Obs Status (Signed)
[facility] NOTIFICATION   Patient Details  Name: Spencer Rice MRN: [id number] Date of Birth: 1941/06/17   Medicare Observation Status Notification Given:  Yes    Maryclare Labrador, RN 11/30/2015, 12:12 PM

## 2015-12-07 DIAGNOSIS — Z95810 Presence of automatic (implantable) cardiac defibrillator: Secondary | ICD-10-CM | POA: Insufficient documentation

## 2015-12-08 DIAGNOSIS — R140 Abdominal distension (gaseous): Secondary | ICD-10-CM | POA: Insufficient documentation

## 2015-12-08 DIAGNOSIS — K029 Dental caries, unspecified: Secondary | ICD-10-CM | POA: Insufficient documentation

## 2015-12-08 DIAGNOSIS — Z7901 Long term (current) use of anticoagulants: Secondary | ICD-10-CM | POA: Insufficient documentation

## 2015-12-08 DIAGNOSIS — N2889 Other specified disorders of kidney and ureter: Secondary | ICD-10-CM | POA: Insufficient documentation

## 2015-12-08 DIAGNOSIS — H5203 Hypermetropia, bilateral: Secondary | ICD-10-CM | POA: Insufficient documentation

## 2015-12-08 DIAGNOSIS — M1990 Unspecified osteoarthritis, unspecified site: Secondary | ICD-10-CM | POA: Insufficient documentation

## 2015-12-08 DIAGNOSIS — J449 Chronic obstructive pulmonary disease, unspecified: Secondary | ICD-10-CM | POA: Insufficient documentation

## 2015-12-08 DIAGNOSIS — I513 Intracardiac thrombosis, not elsewhere classified: Secondary | ICD-10-CM | POA: Insufficient documentation

## 2015-12-08 DIAGNOSIS — H43399 Other vitreous opacities, unspecified eye: Secondary | ICD-10-CM | POA: Insufficient documentation

## 2015-12-08 DIAGNOSIS — R0602 Shortness of breath: Secondary | ICD-10-CM | POA: Insufficient documentation

## 2015-12-08 DIAGNOSIS — E785 Hyperlipidemia, unspecified: Secondary | ICD-10-CM | POA: Insufficient documentation

## 2015-12-08 DIAGNOSIS — G459 Transient cerebral ischemic attack, unspecified: Secondary | ICD-10-CM | POA: Insufficient documentation

## 2015-12-08 DIAGNOSIS — Z87898 Personal history of other specified conditions: Secondary | ICD-10-CM | POA: Insufficient documentation

## 2015-12-08 DIAGNOSIS — H524 Presbyopia: Secondary | ICD-10-CM

## 2015-12-08 DIAGNOSIS — R93429 Abnormal radiologic findings on diagnostic imaging of unspecified kidney: Secondary | ICD-10-CM | POA: Insufficient documentation

## 2015-12-08 DIAGNOSIS — R7309 Other abnormal glucose: Secondary | ICD-10-CM | POA: Insufficient documentation

## 2015-12-08 DIAGNOSIS — N179 Acute kidney failure, unspecified: Secondary | ICD-10-CM | POA: Insufficient documentation

## 2015-12-08 DIAGNOSIS — G4710 Hypersomnia, unspecified: Secondary | ICD-10-CM | POA: Insufficient documentation

## 2015-12-08 DIAGNOSIS — H43813 Vitreous degeneration, bilateral: Secondary | ICD-10-CM | POA: Insufficient documentation

## 2015-12-15 NOTE — Discharge Summary (Signed)
Physician Discharge Summary  Spencer Rice MRN: [id number] DOB/AGE: [birth year] 75 y.o.  PCP: [name], MD   Admit date: 11/29/2015 Discharge date: 12/15/2015  Discharge Diagnoses:     Active Problems:   Chest pain at rest   Essential hypertension   Acute on chronic systolic (congestive) heart failure (HCC)   GERD (gastroesophageal reflux disease)    Follow-up recommendations Follow-up with PCP in 3-5 days , including all  additional recommended appointments as below Follow-up CBC, CMP in 3-5 days      Medication List    TAKE these medications        aspirin 325 MG tablet  Take 325 mg by mouth daily.     carvedilol 12.5 MG tablet  Commonly known as:  COREG  Take 12.5 mg by mouth 2 (two) times daily with a meal.     esomeprazole 20 MG capsule  Commonly known as:  NEXIUM  Take 20 mg by mouth daily as needed (FOR HEARTBURN).     fosinopril 10 MG tablet  Commonly known as:  MONOPRIL  Take 5 mg by mouth daily.     furosemide 40 MG tablet  Commonly known as:  LASIX  Take 1 tablet (40 mg total) by mouth 2 (two) times daily.     furosemide 20 MG tablet  Commonly known as:  LASIX  Take 1.5 tablets (30 mg total) by mouth every morning.     furosemide 20 MG tablet  Commonly known as:  LASIX  Take 1 tablet (20 mg total) by mouth every evening.     warfarin 5 MG tablet  Commonly known as:  COUMADIN  Take 5 mg by mouth daily.         Discharge Condition: *   Discharge Instructions     No Known Allergies    Disposition: 01-Home or Self Care   Consults: Cardiology    Significant Diagnostic Studies:  Dg Chest 2 View  11/29/2015  CLINICAL DATA:  Upper chest discomfort since yesterday EXAM: CHEST  2 VIEW COMPARISON:  May 09, 2015 FINDINGS: The heart size and mediastinal contours are stable. Heart size is enlarged. Cardiac pacemaker is unchanged. Both lungs are clear. The visualized skeletal structures are stable. IMPRESSION: No active  cardiopulmonary disease.  Cardiomegaly. Electronically Signed   By: Abelardo Diesel M.D.   On: 11/29/2015 09:21        Filed Weights   11/29/15 0803 11/29/15 1257 11/30/15 0526  Weight: 97.07 kg (214 lb) 100.4 kg (221 lb 5.5 oz) 95.9 kg (211 lb 6.7 oz)     Microbiology: No results found for this or any previous visit (from the past 240 hour(s)).     Blood Culture No results found for: SDES, SPECREQUEST, CULT, REPTSTATUS    Labs: No results found for this or any previous visit (from the past 48 hour(s)).   Lipid Panel  No results found for: CHOL, TRIG, HDL, CHOLHDL, VLDL, LDLCALC, LDLDIRECT   No results found for: HGBA1C   Lab Results  Component Value Date   CREATININE 1.48* 11/30/2015     HPI :Spencer Rice is a 75 y.o. male with a history of cardiomyopathy s/p BIV ICD, Left renal cell carcinoma s/p cryoablation, CAD, chronic systolic CHF (EF [id number]), HTN, HLD and previous DVT on coumadin who presented to [facility] 11/19/15 for chest fullness.  He has a history of interpolar renal mass biopsy proven to be RCC Grade 2. S/p cryoablation 06/2014 FU CT abd 12/23/14 and 06/2015 shows no evidence  of persistent/recurrent tumor.  He is followed closely by Dr. Minna Merritts in [facility]. He has a long history of a "weak heart" but cannot tell me his last EF. He had a BIV ICD placed in 2011. He had a heart catheterization at that time. He reports that he never had any blockages or stents. Per CARE EVERYWHERE: he had VF episode that terminated without a shock in [place] 2016.   He had symptoms reminiscent of his previous volume overload and presented to [facility] yesterday. He was given IV lasix with complete resolution of his symptoms. He thought he would just go home but they wanted to keep him overnight for observation. There were no available beds at [facility] and he was sent here.   Labs: BNP noted to be slightly elevated at 1057. Troponin mildly elevated and flat 0.06--> 0.04--> 0.05 --> 0.05. CXR with  no active cardiopulmonary disease.   He denies any chest pain and is adamant about this. He only felt "congested" and fatigued, which if very typical of his CHF exaserbations. Ever since the IV lasix in the ER, he has felt completely better  HOSPITAL COURSE:   Acute on chronic systolic CHF:  -- BNP elevated, but CXR clear. Given IV lasix 40mg  x1. Net neg 1.9L.  -- 2D ECHO 11/30/15: EF 10%. G1DD, mild LVE, biatrial enlargement, mild MR, mildly elevated pulmonary pressure. -- Continue ACE and BB. Cardiology recommended lasix 40mg  PO BID for 3 days then go back to his home dose    The estimated ejection fraction was 10%. Diffuse hypokinesis. There is akinesis of the inferolateral myocardium. Doppler parameters are consistent with abnormal left ventricular relaxation (grade 1 diastolic dysfunction).EF 10% unchanged  Elevated troponin: mildly elevated and flat 0.06--> 0.04--> 0.05 --> 0.05 c/w demand ischemia.   Possible apical thrombus: 2D ECHO "cannot R/O apical thrombus; suggest fu limited study with definity." Doubt thrombus with therapeutic INR.    Hx of DVT: on chronic coumadin. INR 3.06--> 2.75  CKD: creat 1.48. S/p RCC and cryoablation therapy.   HTN: BP well controlled on current regimen   Dispo: per cardiology ok to  go home today with close follow up with Dr. Minna Merritts. I personally called his office. He has a device check on 12/06/14 and i scheduled a f/u with Dr. Minna Merritts on 12/09/14.    Discharge Exam:    Blood pressure 120/80, pulse 76, temperature 98.4 F (36.9 C), temperature source Oral, resp. rate 18, height 6\' 1"  (1.854 m), weight 95.9 kg (211 lb 6.7 oz), SpO2 99 %.  General: Well developed, well nourished, male in no acute distress Head: Eyes PERRLA, No xanthomas. Normocephalic and atraumatic, oropharynx without edema or  Lungs: CTAB Heart: HRRR S1 S2, no rub/gallop, Heart regular rate and rhythm with S1, S2 murmur. pulses are 2+ extrem.  Neck: No carotid  bruits. No lymphadenopathy. No JVD. Abdomen: Bowel sounds present, abdomen soft and non-tender without masses or hernias noted. Msk: No spine or cva tenderness. No weakness, no joint deformities or effusions. Extremities: No clubbing or cyanosis. No edema.  Neuro: Alert and oriented X 3. No focal deficits noted. Psych: Good affect, responds appropriately Skin: No rashes or lesions noted.     Follow-up Information    Follow up with Mahala Menghini, MD.   Specialty:  Cardiology   Contact information:   [address] [place] [address] [phone]       Follow up with Mahala Menghini, MD On 12/10/2015.   Specialty:  Cardiology   Why:  @ 3pm.    Contact information:   [address] [place] [address] [phone]       Follow up with [name], MD. Schedule an appointment as soon as possible for a visit in 1 week.   Specialty:  Internal Medicine   Why:   Tuesday, January 3rd at 3:00pm   Contact information:   [id number] [place] [address] [phone]       Signed: Reyne Dumas 12/15/2015, 5:59 PM        Time spent >45 mins

## 2016-06-06 ENCOUNTER — Emergency Department (HOSPITAL_BASED_OUTPATIENT_CLINIC_OR_DEPARTMENT_OTHER)
Admission: EM | Admit: 2016-06-06 | Discharge: 2016-06-06 | Disposition: A | Discharge status: Home / Self Care | Payer: Medicare Other | Attending: Emergency Medicine | Admitting: Emergency Medicine

## 2016-06-06 ENCOUNTER — Encounter (HOSPITAL_BASED_OUTPATIENT_CLINIC_OR_DEPARTMENT_OTHER): Payer: Self-pay | Admitting: Emergency Medicine

## 2016-06-06 DIAGNOSIS — Z85528 Personal history of other malignant neoplasm of kidney: Secondary | ICD-10-CM | POA: Insufficient documentation

## 2016-06-06 DIAGNOSIS — I110 Hypertensive heart disease with heart failure: Secondary | ICD-10-CM | POA: Insufficient documentation

## 2016-06-06 DIAGNOSIS — Z7982 Long term (current) use of aspirin: Secondary | ICD-10-CM | POA: Diagnosis not present

## 2016-06-06 DIAGNOSIS — K219 Gastro-esophageal reflux disease without esophagitis: Secondary | ICD-10-CM | POA: Diagnosis not present

## 2016-06-06 DIAGNOSIS — Z87891 Personal history of nicotine dependence: Secondary | ICD-10-CM | POA: Insufficient documentation

## 2016-06-06 DIAGNOSIS — I5023 Acute on chronic systolic (congestive) heart failure: Secondary | ICD-10-CM

## 2016-06-06 DIAGNOSIS — M25461 Effusion, right knee: Secondary | ICD-10-CM | POA: Insufficient documentation

## 2016-06-06 DIAGNOSIS — I2510 Atherosclerotic heart disease of native coronary artery without angina pectoris: Secondary | ICD-10-CM | POA: Diagnosis not present

## 2016-06-06 DIAGNOSIS — I10 Essential (primary) hypertension: Secondary | ICD-10-CM

## 2016-06-06 DIAGNOSIS — Z79899 Other long term (current) drug therapy: Secondary | ICD-10-CM | POA: Diagnosis not present

## 2016-06-06 DIAGNOSIS — M7989 Other specified soft tissue disorders: Secondary | ICD-10-CM | POA: Diagnosis present

## 2016-06-06 LAB — CBC WITH DIFFERENTIAL/PLATELET
Basophils Absolute: 0 10*3/uL (ref 0.0–0.1)
Basophils Relative: 0 %
EOS PCT: 1 %
Eosinophils Absolute: 0 10*3/uL (ref 0.0–0.7)
HCT: 40 % (ref 39.0–52.0)
HEMOGLOBIN: 13.8 g/dL (ref 13.0–17.0)
LYMPHS ABS: 1.1 10*3/uL (ref 0.7–4.0)
LYMPHS PCT: 33 %
MCH: 28.9 pg (ref 26.0–34.0)
MCHC: 34.5 g/dL (ref 30.0–36.0)
MCV: 83.9 fL (ref 78.0–100.0)
Monocytes Absolute: 0.4 10*3/uL (ref 0.1–1.0)
Monocytes Relative: 12 %
Neutro Abs: 1.9 10*3/uL (ref 1.7–7.7)
Neutrophils Relative %: 54 %
PLATELETS: 135 10*3/uL — AB (ref 150–400)
RBC: 4.77 MIL/uL (ref 4.22–5.81)
RDW: 14.1 % (ref 11.5–15.5)
WBC: 3.4 10*3/uL — AB (ref 4.0–10.5)

## 2016-06-06 LAB — PROTIME-INR
INR: 3.37 — ABNORMAL HIGH (ref 0.00–1.49)
Prothrombin Time: 33.4 seconds — ABNORMAL HIGH (ref 11.6–15.2)

## 2016-06-06 NOTE — Discharge Instructions (Signed)
Your workup was reassuring today that you do not have a bony injury or dislocation to your knee.  We recommend that you use the provided Ace wrap but you can continue to bear weight as tolerated.  Read through the included information about routine care for injuries. Hold your next dose of Coumadin as your INR was 3.37 Follow up as recommended if you are still having problems in about a week.  Knee Pain Knee pain is a very common symptom and can have many causes. Knee pain often goes away when you follow your health care provider's instructions for relieving pain and discomfort at home. However, knee pain can develop into a condition that needs treatment. Some conditions may include:  Arthritis caused by wear and tear (osteoarthritis).  Arthritis caused by swelling and irritation (rheumatoid arthritis or gout).  A cyst or growth in your knee.  An infection in your knee joint.  An injury that will not heal.  Damage, swelling, or irritation of the tissues that support your knee (torn ligaments or tendinitis). If your knee pain continues, additional tests may be ordered to diagnose your condition. Tests may include X-rays or other imaging studies of your knee. You may also need to have fluid removed from your knee. Treatment for ongoing knee pain depends on the cause, but treatment may include:  Medicines to relieve pain or swelling.  Steroid injections in your knee.  Physical therapy.  Surgery. HOME CARE INSTRUCTIONS  Take medicines only as directed by your health care provider.  Rest your knee and keep it raised (elevated) while you are resting.  Do not do things that cause or worsen pain.  Avoid high-impact activities or exercises, such as running, jumping rope, or doing jumping jacks.  Apply ice to the knee area:  Put ice in a plastic bag.  Place a towel between your skin and the bag.  Leave the ice on for 20 minutes, 2-3 times a day.  Ask your health care provider if you  should wear an elastic knee support.  Keep a pillow under your knee when you sleep.  Lose weight if you are overweight. Extra weight can put pressure on your knee.  Do not use any tobacco products, including cigarettes, chewing tobacco, or electronic cigarettes. If you need help quitting, ask your health care provider. Smoking may slow the healing of any bone and joint problems that you may have. SEEK MEDICAL CARE IF:  Your knee pain continues, changes, or gets worse.  You have a fever along with knee pain.  Your knee buckles or locks up.  Your knee becomes more swollen. SEEK IMMEDIATE MEDICAL CARE IF:   Your knee joint feels hot to the touch.  You have chest pain or trouble breathing.   This information is not intended to replace advice given to you by your health care provider. Make sure you discuss any questions you have with your health care provider.   Document Released: 09/17/2007 Document Revised: 12/11/2014 Document Reviewed: 07/06/2014 Elsevier Interactive Patient Education 2016 [place] DO?   Elastic bandages come in different shapes and sizes. They generally provide support to your injury and reduce swelling while you are healing, but they can perform different functions. Your health care provider will help you to decide what is best for your protection, recovery, or rehabilitation following an injury.  WHAT ARE SOME GENERAL TIPS FOR USING AN ELASTIC BANDAGE?  Use the  bandage as directed by the maker of the bandage that you are using.  Do not wrap the bandage too tightly. This may cut off the circulation in the arm or leg in the area below the bandage.  If part of your body beyond the bandage becomes blue, numb, cold, swollen, or is more painful, your bandage is most likely too tight. If this occurs, remove your bandage and reapply it more loosely. See your health care provider if the bandage seems to be  making your problems worse rather than better.  An elastic bandage should be removed and reapplied every 3-4 hours or as directed by your health care provider. WHAT IS RICE?  The routine care of many injuries includes rest, ice, compression, and elevation (RICE therapy).  Rest  Rest is required to allow your body to heal. Generally, you can resume your routine activities when you are comfortable and have been given permission by your health care provider.  Ice  Icing your injury helps to keep the swelling down and it reduces pain. Do not apply ice directly to your skin.  Put ice in a plastic bag.  Place a towel between your skin and the bag.  Leave the ice on for 20 minutes, 2-3 times per day. Do this for as Jerrianne Hartin as you are directed by your health care provider.  Compression  Compression helps to keep swelling down, gives support, and helps with discomfort. Compression may be done with an elastic bandage.  Elevation  Elevation helps to reduce swelling and it decreases pain. If possible, your injured area should be placed at or above the level of your heart or the center of your chest.  [place]?  You should seek medical care if:  You have persistent pain and swelling.  Your symptoms are getting worse rather than improving. These symptoms may indicate that further evaluation or further X-rays are needed. Sometimes, X-rays may not show a small broken bone (fracture) until a number of days later. Make a follow-up appointment with your health care provider. Ask when your X-ray results will be ready. Make sure that you get your X-ray results.  WHEN SHOULD I SEEK IMMEDIATE MEDICAL CARE?  You should seek immediate medical care if:  You have a sudden onset of severe pain at or below the area of your injury.  You develop redness or increased swelling around your injury.  You have tingling or numbness at or below the area of your injury that does not improve after you remove the  elastic bandage. This information is not intended to replace advice given to you by your health care provider. Make sure you discuss any questions you have with your health care provider.  Document Released: 05/12/2002 Document Revised: 08/11/2015 Document Reviewed: 07/06/2014  Elsevier Interactive Patient Education [employer].

## 2016-06-06 NOTE — ED Provider Notes (Signed)
Emergency Department Provider Note                                    ____________________________________________  Time seen: Approximately 9:11 AM  I have reviewed the triage vital signs and the nursing notes.   HISTORY  Chief Complaint Joint Swelling  HPI Spencer Rice is a 75 y.o. male with PMH of acute on chronic systolic heart failure, renal cell carcinoma, GERD, presents to the emergency department for evaluation of spontaneous right knee effusion. Patient states it is nontender. He has preserved range of motion and is able to walk on it. He denies any trauma to the knee. He is on Coumadin and reports that his INR checks have been within normal limits. His next one is Friday. He has been compliant with his medication and diet. No fevers or chills. No chest pain or difficulty breathing. No abdominal pain.   Past Medical History  Diagnosis Date  . Coronary artery disease   . CHF (congestive heart failure) ([place])   . AICD (automatic cardioverter/defibrillator) present   . Hypertension   . DVT (deep venous thrombosis) ([facility]) 1983    LLE  . History of hiatal hernia   . GERD (gastroesophageal reflux disease)   . Cancer of left kidney ([place])     S/P OR 05/2014    Patient Active Problem List   Diagnosis Date Noted  . Chest pain at rest 11/29/2015  . Essential hypertension 11/29/2015  . Acute on chronic systolic (congestive) heart failure ([place]) 11/29/2015  . GERD (gastroesophageal reflux disease) 11/29/2015  . Pain in the chest   . 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 Implantable cardioverter defibrillator implant  07/21/2010  . Cardiac catheterization  1980's  . Renal cryoablation Left 05/2014    Current Outpatient Rx  Name  Route  Sig  Dispense  Refill  . aspirin 325 MG tablet   Oral   Take 325 mg by mouth daily.         . carvedilol (COREG) 12.5 MG tablet   Oral   Take 12.5 mg by mouth 2 (two) times  daily with a meal.          . esomeprazole (NEXIUM) 20 MG capsule   Oral   Take 20 mg by mouth daily as needed (FOR HEARTBURN).         . fosinopril (MONOPRIL) 10 MG tablet   Oral   Take 5 mg by mouth daily.         . furosemide (LASIX) 20 MG tablet   Oral   Take 1.5 tablets (30 mg total) by mouth every morning.   30 tablet   1   . furosemide (LASIX) 20 MG tablet   Oral   Take 1 tablet (20 mg total) by mouth every evening.   30 tablet   1   . EXPIRED: furosemide (LASIX) 40 MG tablet   Oral   Take 1 tablet (40 mg total) by mouth 2 (two) times daily.   7 tablet   0   . warfarin (COUMADIN) 5 MG tablet   Oral   Take 5 mg by mouth daily.           Allergies Review of patient's allergies indicates no known allergies.  Family History  Problem Relation Age of Onset  . Heart disease Father   .  Heart attack Father 47  . Hypertension Father   . Hypertension Mother     Social History Social History  Substance Use Topics  . Smoking status: Former Smoker -- 0.75 packs/day for 17 years    Types: Cigarettes    Start date: 05/08/1959    Quit date: 06/20/1976  . Smokeless tobacco: None  . Alcohol Use: No     Comment: 11/29/2015 "stopped drinking in 1977"    Review of Systems Constitutional: No fever/chills Eyes: No visual changes. ENT: No sore throat. Cardiovascular: Denies chest pain. Respiratory: Denies shortness of breath. Gastrointestinal: No abdominal pain.  No nausea, no vomiting.  No diarrhea.  No constipation. Genitourinary: Negative for dysuria. Musculoskeletal: Negative for back pain. Positive right knee swelling.  Skin: Negative for rash. Neurological: Negative for headaches, focal weakness or numbness. 10-point ROS otherwise negative.  ____________________________________________   PHYSICAL EXAM:  VITAL SIGNS: ED Triage Vitals  Enc Vitals Group     BP 06/06/16 0830 103/55 mmHg     Pulse Rate 06/06/16 0830 69     Resp 06/06/16 0830 16       Temp 06/06/16 0830 97.8 F (36.6 C)     Temp Source 06/06/16 0830 Oral     SpO2 06/06/16 0830 97 %     Weight 06/06/16 0830 220 lb (99.791 kg)     Height 06/06/16 0830 6\' 1"  (1.854 m)   Constitutional: Alert and oriented. Well appearing and in no acute distress. Eyes: Conjunctivae are normal. EOMI. Head: Atraumatic. Ears:  Healthy appearing ear canals.  Nose: No congestion/rhinnorhea. Mouth/Throat: Mucous membranes are moist.  Oropharynx non-erythematous. Neck: No stridor.  No meningeal signs.  Cardiovascular: Normal rate, regular rhythm. Good peripheral circulation. Grossly normal heart sounds.   Respiratory: Normal respiratory effort.  No retractions. Lungs CTAB. Gastrointestinal: Soft and nontender. No distention.  Musculoskeletal: No lower extremity tenderness. Large right knee effusion confirmed on bedside US. Mild tenderness to palpation but full ROM. No LE swelling. Normal pulses and sensation.  Neurologic:  Normal speech and language. No gross focal neurologic deficits are appreciated.  Skin:  Skin is warm, dry and intact. No rash noted. Psychiatric: Mood and affect are normal. Speech and behavior are normal.  ____________________________________________   LABS (all labs ordered are listed, but only abnormal results are displayed)  Labs Reviewed  CBC WITH DIFFERENTIAL/PLATELET - Abnormal; Notable for the following:    WBC 3.4 (*)    Platelets 135 (*)    All other components within normal limits  PROTIME-INR - Abnormal; Notable for the following:    Prothrombin Time 33.4 (*)    INR 3.37 (*)    All other components within normal limits   ____________________________________________  RADIOLOGY  None ____________________________________________   PROCEDURES  Procedure(s) performed:   Procedures  ULTRASOUND LIMITED SOFT TISSUE/ MUSCULOSKELETAL:  Indication: Right knee swelling Linear probe used to evaluate area of interest in two planes. Findings:  Large  right knee effusion.  Performed by: Dr Laverta Baltimore Images saved electronically ____________________________________________   INITIAL IMPRESSION / [place] / ED COURSE  Pertinent labs & imaging results that were available during my care of the patient were reviewed by me and considered in my medical decision making (see chart for details).  Patient, on Coumadin, presents to the emergency department with spontaneous swelling of his right knee. It is not significantly tender to palpation. He has full passive and active range of motion. Bedside ultrasound confirms large knee effusion. Very low suspicion for septic arthritis.  No injury by report. Patient has no bony tenderness. With no significant pain and normal gait plan to treat conservatively without arthrocentesis. Will check INR and CBC.   ____________________________________________  FINAL CLINICAL IMPRESSION(S) / ED DIAGNOSES  Final diagnoses:  None   INR is 3.37. Will ask the patient to hold his evening dose. Will provide ACE wrap for compression. He has repeat INR this week. Patient will contact his PCP today for close follow up. Discussed the risks and benefits of arthrocentesis with the patient who would like to discharge without this procedure. Discussed return precautions in detail.   MEDICATIONS GIVEN DURING THIS VISIT:  None  NEW OUTPATIENT MEDICATIONS STARTED DURING THIS VISIT:  None   Note:  This document was prepared using Dragon voice recognition software and may include unintentional dictation errors.  Nanda Quinton, MD Emergency Medicine  Margette Fast, MD 06/06/16 [phone]

## 2016-06-06 NOTE — ED Notes (Signed)
Patient reports swelling to right knee which began this morning.  States when he woke up it was swollen.  States when he went to bed last night both knees were the same size.  Denies injury/pain.

## 2016-06-06 NOTE — ED Notes (Signed)
MD at bedside. 

## 2016-06-13 ENCOUNTER — Other Ambulatory Visit: Payer: Self-pay | Admitting: Radiology

## 2016-06-13 DIAGNOSIS — C642 Malignant neoplasm of left kidney, except renal pelvis: Secondary | ICD-10-CM

## 2016-06-15 ENCOUNTER — Other Ambulatory Visit: Payer: Self-pay | Admitting: Interventional Radiology

## 2016-06-15 DIAGNOSIS — C642 Malignant neoplasm of left kidney, except renal pelvis: Secondary | ICD-10-CM

## 2016-06-23 DIAGNOSIS — R7302 Impaired glucose tolerance (oral): Secondary | ICD-10-CM | POA: Insufficient documentation

## 2016-07-20 ENCOUNTER — Ambulatory Visit
Admission: RE | Admit: 2016-07-20 | Discharge: 2016-07-20 | Disposition: A | Discharge status: Home / Self Care | Payer: Medicare Other | Source: Ambulatory Visit | Attending: Interventional Radiology | Admitting: Interventional Radiology

## 2016-07-20 DIAGNOSIS — C642 Malignant neoplasm of left kidney, except renal pelvis: Secondary | ICD-10-CM

## 2016-07-20 HISTORY — PX: IR GENERIC HISTORICAL: IMG1180011

## 2016-07-20 NOTE — Progress Notes (Signed)
Chief Complaint: Status post cryoablation of a left clear cell renal carcinoma on 06/18/2014.  History of Present Illness: Spencer Rice is a 75 y.o. male two years status post cryoablation of a left renal carcinoma. 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 He has no current complaints.  Past Medical History:  Diagnosis Date  . AICD (automatic cardioverter/defibrillator) present   . Cancer of left kidney ([place])    S/P OR 05/2014  . CHF (congestive heart failure) ([place])   . Coronary artery disease   . DVT (deep venous thrombosis) ([facility]) 1983   LLE  . GERD (gastroesophageal reflux disease)   . History of hiatal hernia   . Hypertension     Past Surgical History:  Procedure Laterality Date  . CARDIAC CATHETERIZATION  1980's  . IMPLANTABLE CARDIOVERTER DEFIBRILLATOR IMPLANT  07/21/2010  . RENAL CRYOABLATION Left 05/2014    Allergies: Review of patient's allergies indicates no known allergies.  Medications: Prior to Admission medications   Medication Sig Start Date End Date Taking? Authorizing Provider  aspirin 81 MG chewable tablet Chew 81 mg by mouth.   Yes Historical Provider, MD  carvedilol (COREG) 12.5 MG tablet Take 12.5 mg by mouth 2 (two) times daily with a meal.    Yes Historical Provider, MD  esomeprazole (NEXIUM) 20 MG capsule Take 20 mg by mouth daily as needed (FOR HEARTBURN).   Yes Historical Provider, MD  fosinopril (MONOPRIL) 10 MG tablet Take 5 mg by mouth daily.   Yes Historical Provider, MD  furosemide (LASIX) 20 MG tablet Take 1.5 tablets (30 mg total) by mouth every morning. 12/04/15  Yes Hosie Poisson, MD  warfarin (COUMADIN) 5 MG tablet Take 5 mg by mouth daily.   Yes Historical Provider, MD     Family History  Problem Relation Age of Onset  . Heart disease Father   . Heart attack Father 48  . Hypertension Father   . Hypertension Mother     Social History   Social History  . Marital status: Married   Spouse name: N/A  . Number of children: N/A  . Years of education: N/A   Social History Main Topics  . Smoking status: Former Smoker    Packs/day: 0.75    Years: 17.00    Types: Cigarettes    Start date: 05/08/1959    Quit date: 06/20/1976  . Smokeless tobacco: None  . Alcohol use No     Comment: 11/29/2015 "stopped drinking in 1977"  . Drug use: No  . Sexual activity: Yes   Other Topics Concern  . None   Social History Narrative  . None    Review of Systems: A 12 point ROS discussed and pertinent positives are indicated in the HPI above.  All other systems are negative.  Review of Systems  Constitutional: Negative.   Respiratory: Negative.   Cardiovascular: Negative.   Gastrointestinal: Negative.   Genitourinary: Negative.   Musculoskeletal: Negative.   Neurological: Negative.     Vital Signs: BP (!) 103/56 (BP Location: Left Arm, Patient Position: Sitting, Cuff Size: Normal)   Pulse 74   Temp 98 F (36.7 C)   Resp 18   Ht 6\' 1"  (1.854 m)   Wt 218 lb (98.9 kg)   SpO2 100%   BMI 28.76 kg/m   Physical Exam  Constitutional: He is oriented to person, place, and time.  Abdominal: Soft. He exhibits no distension and no mass. There is no  tenderness. There is no guarding.  Neurological: He is alert and oriented to person, place, and time.  Nursing note and vitals reviewed.   Labs:  CBC:  Recent Labs  11/29/15 0815 11/30/15 0005 06/06/16 0901  WBC 3.7* 5.3 3.4*  HGB 14.0 14.3 13.8  HCT 41.5 43.1 40.0  PLT 121* 135* 135*    COAGS:  Recent Labs  11/29/15 0815 11/30/15 0005 06/06/16 0901  INR 3.06* 2.75* 3.37*    BMP:  Recent Labs  11/29/15 0815 11/30/15 0005  NA 140 140  K 3.9 4.6  CL 110 103  CO2 26 27  GLUCOSE 171* 102*  BUN 29* 27*  CALCIUM 8.6* 9.3  CREATININE 1.33* 1.48*  GFRNONAA 51* 45*  GFRAA 59* 52*    LIVER FUNCTION TESTS:  Recent Labs  11/29/15 0815 11/30/15 0005  BILITOT 0.5 1.1  AST 28 34  ALT 19 16*  ALKPHOS  64 56  PROT 6.5 5.9*  ALBUMIN 3.5 3.4*   Cr 1.29 on 07/17/16  Assessment and Plan:  Follow-up CT of the abdomen was performed on 07/17/2016 at [facility]. This demonstrates an evolving ablation defect of the left kidney which is slightly decreased in size and shows no evidence of internal enhancement to suggest recurrent carcinoma. The previously described subtle area of possible enhancement in the interpolar left kidney is no longer visualized.  I recommended another follow-up CT in one year.  Electronically SignedAletta Edouard T 07/20/2016, 4:53 PM   I spent a total of 15 Minutes in face to face in clinical consultation, greater than 50% of which was counseling/coordinating care post cryoablation of a left renal carcinoma.

## 2016-09-06 ENCOUNTER — Emergency Department (HOSPITAL_BASED_OUTPATIENT_CLINIC_OR_DEPARTMENT_OTHER): Payer: Medicare Other

## 2016-09-06 ENCOUNTER — Encounter (HOSPITAL_BASED_OUTPATIENT_CLINIC_OR_DEPARTMENT_OTHER): Payer: Self-pay | Admitting: *Deleted

## 2016-09-06 ENCOUNTER — Emergency Department (HOSPITAL_BASED_OUTPATIENT_CLINIC_OR_DEPARTMENT_OTHER)
Admission: EM | Admit: 2016-09-06 | Discharge: 2016-09-06 | Disposition: A | Discharge status: Home / Self Care | Payer: Medicare Other | Attending: Emergency Medicine | Admitting: Emergency Medicine

## 2016-09-06 DIAGNOSIS — R51 Headache: Secondary | ICD-10-CM | POA: Insufficient documentation

## 2016-09-06 DIAGNOSIS — I110 Hypertensive heart disease with heart failure: Secondary | ICD-10-CM | POA: Diagnosis not present

## 2016-09-06 DIAGNOSIS — J449 Chronic obstructive pulmonary disease, unspecified: Secondary | ICD-10-CM | POA: Insufficient documentation

## 2016-09-06 DIAGNOSIS — Z79899 Other long term (current) drug therapy: Secondary | ICD-10-CM | POA: Insufficient documentation

## 2016-09-06 DIAGNOSIS — R1013 Epigastric pain: Secondary | ICD-10-CM | POA: Insufficient documentation

## 2016-09-06 DIAGNOSIS — I2510 Atherosclerotic heart disease of native coronary artery without angina pectoris: Secondary | ICD-10-CM | POA: Insufficient documentation

## 2016-09-06 DIAGNOSIS — Z87891 Personal history of nicotine dependence: Secondary | ICD-10-CM | POA: Diagnosis not present

## 2016-09-06 DIAGNOSIS — Z7982 Long term (current) use of aspirin: Secondary | ICD-10-CM | POA: Diagnosis not present

## 2016-09-06 DIAGNOSIS — Z85528 Personal history of other malignant neoplasm of kidney: Secondary | ICD-10-CM | POA: Diagnosis not present

## 2016-09-06 DIAGNOSIS — I5023 Acute on chronic systolic (congestive) heart failure: Secondary | ICD-10-CM | POA: Insufficient documentation

## 2016-09-06 LAB — BASIC METABOLIC PANEL
ANION GAP: 8 (ref 5–15)
BUN: 27 mg/dL — ABNORMAL HIGH (ref 6–20)
CO2: 24 mmol/L (ref 22–32)
Calcium: 9 mg/dL (ref 8.9–10.3)
Chloride: 108 mmol/L (ref 101–111)
Creatinine, Ser: 1.28 mg/dL — ABNORMAL HIGH (ref 0.61–1.24)
GFR calc Af Amer: >60 mL/min (ref >60)
GFR calc non Af Amer: 53 mL/min — ABNORMAL LOW (ref >60)
GLUCOSE: 136 mg/dL — AB (ref 65–99)
POTASSIUM: 3.7 mmol/L (ref 3.5–5.1)
Sodium: 140 mmol/L (ref 135–145)

## 2016-09-06 LAB — CBC WITH DIFFERENTIAL/PLATELET
BASOS ABS: 0 10*3/uL (ref 0.0–0.1)
Basophils Relative: 0 %
Eosinophils Absolute: 0.1 10*3/uL (ref 0.0–0.7)
Eosinophils Relative: 1 %
HEMATOCRIT: 40.8 % (ref 39.0–52.0)
Hemoglobin: 14.1 g/dL (ref 13.0–17.0)
LYMPHS PCT: 43 %
Lymphs Abs: 2.1 10*3/uL (ref 0.7–4.0)
MCH: 28.7 pg (ref 26.0–34.0)
MCHC: 34.6 g/dL (ref 30.0–36.0)
MCV: 83.1 fL (ref 78.0–100.0)
MONO ABS: 0.5 10*3/uL (ref 0.1–1.0)
Monocytes Relative: 10 %
NEUTROS ABS: 2.3 10*3/uL (ref 1.7–7.7)
Neutrophils Relative %: 46 %
Platelets: 132 10*3/uL — ABNORMAL LOW (ref 150–400)
RBC: 4.91 MIL/uL (ref 4.22–5.81)
RDW: 14 % (ref 11.5–15.5)
WBC: 5 10*3/uL (ref 4.0–10.5)

## 2016-09-06 LAB — BRAIN NATRIURETIC PEPTIDE: B Natriuretic Peptide: 1148.6 pg/mL — ABNORMAL HIGH (ref 0.0–100.0)

## 2016-09-06 LAB — TROPONIN I: TROPONIN I: 0.05 ng/mL — AB (ref <0.03)

## 2016-09-06 MED ORDER — ASPIRIN 81 MG PO CHEW
162.0000 mg | CHEWABLE_TABLET | Freq: Once | ORAL | Status: AC
  Administered 2016-09-06: 162 mg via ORAL

## 2016-09-06 MED ORDER — ASPIRIN 81 MG PO CHEW
324.0000 mg | CHEWABLE_TABLET | Freq: Once | ORAL | Status: DC
  Filled 2016-09-06: qty 4

## 2016-09-06 MED ORDER — NITROGLYCERIN 0.4 MG SL SUBL: 0.4000 mg | SUBLINGUAL_TABLET | SUBLINGUAL | Status: DC | PRN

## 2016-09-06 MED ORDER — GI COCKTAIL ~~LOC~~
30.0000 mL | Freq: Once | ORAL | Status: AC
  Administered 2016-09-06: 30 mL via ORAL
  Filled 2016-09-06: qty 30

## 2016-09-06 NOTE — Discharge Instructions (Signed)
Follow up with your cardiologist.  Call him tomorrow.

## 2016-09-06 NOTE — ED Notes (Signed)
Patient transported to X-ray 

## 2016-09-06 NOTE — ED Notes (Signed)
Pt states he had a little "indigestion" going on this a.m. Normally, does yard work which he was able to do. Kept feeling sensation of needing to burp, but couldn't. Some SHOB with exertion.

## 2016-09-06 NOTE — ED Provider Notes (Signed)
Portal DEPT [facility] Provider Note   CSN: [id number] Arrival date & time: 09/06/16  2016   By signing my name below, I, Delton Prairie, attest that this documentation has been prepared under the direction and in the presence of Deno Etienne, DO  Electronically Signed: Delton Prairie, ED Scribe. 09/06/16. 8:49 PM.   History   Chief Complaint Chief Complaint  Patient presents with  . Abdominal Pain    The history is provided by the patient. No language interpreter was used.   HPI Comments:  Spencer Rice is a 75 y.o. male, with a hx of HTN and CHF, who presents to the Emergency Department complaining of intermittent epigastric pain onset in the AM today. Associated symptoms include intermittent headaches. Pt states he was not able to eat his cereal this morning due to indigestion. He notes he cannot burp. 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 He denies SOB, nausea, vomiting, and diaphoresis. Pt uses an inhaler twice a day. No alleviating factors noted.  Past Medical History:  Diagnosis Date  . AICD (automatic cardioverter/defibrillator) present   . Cancer of left kidney ([place])    S/P OR 05/2014  . CHF (congestive heart failure) ([place])   . Coronary artery disease   . DVT (deep venous thrombosis) ([facility]) 1983   LLE  . GERD (gastroesophageal reflux disease)   . History of hiatal hernia   . Hypertension     Patient Active Problem List   Diagnosis Date Noted  . Glucose intolerance (impaired glucose tolerance) 06/23/2016  . Vitreous floaters 12/08/2015  . TIA (transient ischemic attack) 12/08/2015  . SOB (shortness of breath) 12/08/2015  . Posterior vitreous detachment of both eyes 12/08/2015  . Pain due to dental caries 12/08/2015  . Other abnormal glucose 12/08/2015  . Osteoarthrosis 12/08/2015  . Long term (current) use of anticoagulants 12/08/2015  . Kidney mass 12/08/2015  . Hypersomnolence 12/08/2015  .  Hyperopia with presbyopia of both eyes 12/08/2015  . Hyperlipidemia 12/08/2015  . History of orthopnea 12/08/2015  . COPD (chronic obstructive pulmonary disease) ([place]) 12/08/2015  . 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 AKI (acute kidney injury) ([place]) 12/08/2015  . Abnormal CT scan, kidney 12/08/2015  . Abdominal bloating 12/08/2015  . Biventricular ICD (implantable cardioverter-defibrillator) in place 12/07/2015  . Chest pain at rest 11/29/2015  . Essential hypertension 11/29/2015  . Acute on chronic systolic CHF (congestive heart failure), NYHA class 1 ([place]) 11/29/2015  . Gastroesophageal reflux disease without esophagitis 11/29/2015  . Pain in the chest   . Renal cancer ([place]) 08/16/2015  . 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 Cancer of kidney ([place]) 01/19/2015  . Hematuria 09/04/2014  . Psychosexual dysfunction with inhibited sexual excitement 07/29/2014  . Pelvic pain in male 07/29/2014  . Increased urinary frequency 07/29/2014  . Heartburn 07/29/2014  . EKG, abnormal 07/29/2014  . Corneal scar 07/29/2014  . 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 Cardiomyopathy ([place]) 07/29/2014  . CAD (coronary artery disease) 07/29/2014  . Disorder of kidney and ureter 04/09/2014    Past Surgical History:  Procedure Laterality Date  . CARDIAC CATHETERIZATION  1980's  . IMPLANTABLE CARDIOVERTER DEFIBRILLATOR IMPLANT  07/21/2010  . RENAL CRYOABLATION Left 05/2014       Home Medications    Prior to Admission medications   Medication Sig Start Date End Date Taking? Authorizing Provider  aspirin 81 MG chewable tablet Chew 81 mg by  mouth.    Historical Provider, MD  carvedilol (COREG) 12.5 MG tablet Take 12.5 mg by mouth 2 (two) times daily with a meal.     Historical Provider, MD  esomeprazole (NEXIUM) 20 MG capsule Take 20 mg by mouth daily as needed (FOR HEARTBURN).    Historical Provider, MD  fosinopril (MONOPRIL) 10 MG tablet Take 5 mg by mouth daily.     Historical Provider, MD  furosemide (LASIX) 20 MG tablet Take 1.5 tablets (30 mg total) by mouth every morning. 12/04/15   Hosie Poisson, MD  warfarin (COUMADIN) 5 MG tablet Take 5 mg by mouth daily.    Historical Provider, MD    Family History Family History  Problem Relation Age of Onset  . Heart disease Father   . Heart attack Father 68  . Hypertension Father   . Hypertension Mother     Social History Social History  Substance Use Topics  . Smoking status: Former Smoker    Packs/day: 0.75    Years: 17.00    Types: Cigarettes    Start date: 05/08/1959    Quit date: 06/20/1976  . Smokeless tobacco: Not on file  . Alcohol use No     Comment: 11/29/2015 "stopped drinking in 1977"     Allergies   Review of patient's allergies indicates no known allergies.   Review of Systems Review of Systems  Constitutional: Negative for chills and fever.  HENT: Negative for congestion and facial swelling.   Eyes: Negative for discharge and visual disturbance.  Respiratory: Negative for shortness of breath.   Cardiovascular: Negative for chest pain and palpitations.  Gastrointestinal: Positive for abdominal pain. Negative for diarrhea and vomiting.  Musculoskeletal: Negative for arthralgias and myalgias.  Skin: Negative for color change and rash.  Neurological: Positive for headaches. Negative for tremors and syncope.  Psychiatric/Behavioral: Negative for confusion and dysphoric mood.     Physical Exam Updated Vital Signs BP 104/63   Pulse 62   Temp 98.5 F (36.9 C)   Resp 22   Ht 6' (1.829 m)   Wt 220 lb (99.8 kg)   SpO2 95%   BMI 29.84 kg/m   Physical Exam  Constitutional: He is oriented to person, place, and time. He appears well-developed and well-nourished.  HENT:  Head: Normocephalic and atraumatic.  Eyes: EOM are normal. Pupils are equal, round, and reactive to light.  Neck: Normal range of motion. Neck supple. No JVD present.  Cardiovascular: Normal rate and  regular rhythm.  Exam reveals no gallop and no friction rub.   No murmur heard. Pulmonary/Chest: No respiratory distress. He has no wheezes.  Abdominal: He exhibits no distension. There is no rebound and no guarding.  Musculoskeletal: Normal range of motion.  Neurological: He is alert and oriented to person, place, and time.  Skin: No rash noted. No pallor.  Psychiatric: He has a normal mood and affect. His behavior is normal.  Nursing note and vitals reviewed.    ED Treatments / Results  DIAGNOSTIC STUDIES:  Oxygen Saturation is 99% on RA, normal by my interpretation.    COORDINATION OF CARE:  8:46 PM Discussed treatment plan with pt at bedside and pt agreed to plan.  Labs (all labs ordered are listed, but only abnormal results are displayed) Labs Reviewed  TROPONIN I - Abnormal; Notable for the following:       Result Value   Troponin I 0.05 (*)    All other components within normal limits  CBC WITH DIFFERENTIAL/PLATELET -  Abnormal; Notable for the following:    Platelets 132 (*)    All other components within normal limits  BASIC METABOLIC PANEL - Abnormal; Notable for the following:    Glucose, Bld 136 (*)    BUN 27 (*)    Creatinine, Ser 1.28 (*)    GFR calc non Af Amer 53 (*)    All other components within normal limits  BRAIN NATRIURETIC PEPTIDE - Abnormal; Notable for the following:    B Natriuretic Peptide 1,148.6 (*)    All other components within normal limits    EKG  EKG Interpretation  Date/Time:  Wednesday September 06 2016 20:34:52 EDT Ventricular Rate:  67 PR Interval:    QRS Duration: 186 QT Interval:  466 QTC Calculation: 492 R Axis:   -79 Text Interpretation:  Atrial-sensed ventricular-paced rhythm No further analysis attempted due to paced rhythm No significant change since last tracing Confirmed by Shawnna Pancake MD, Quillian Quince [phone]) on 09/06/2016 9:44:30 PM       Radiology Dg Chest 2 View  Result Date: 09/06/2016 CLINICAL DATA:  Epigastric pain  radiating to the mid upper chest. Sudden onset today. History of hypertension and congestive heart failure. Former smoker. EXAM: CHEST  2 VIEW COMPARISON:  11/29/2015 FINDINGS: Cardiac pacemaker. Shallow inspiration. Mild cardiac enlargement. Pulmonary vascularity is normal. No focal airspace disease or consolidation in the lungs. No blunting of costophrenic angles. No pneumothorax. Calcified and tortuous aorta. Degenerative changes in the spine. IMPRESSION: Mild cardiac enlargement.  No evidence of active pulmonary disease. Electronically Signed   By: Lucienne Capers M.D.   On: 09/06/2016 23:16    Procedures Procedures (including critical care time)  Medications Ordered in ED Medications  gi cocktail (Maalox,Lidocaine,Donnatal) (30 mLs Oral Given 09/06/16 2109)  aspirin chewable tablet 162 mg (162 mg Oral Given 09/06/16 2107)     Initial Impression / Assessment and Plan / ED Course  I have reviewed the triage vital signs and the nursing notes.  Pertinent labs & imaging results that were available during my care of the patient were reviewed by me and considered in my medical decision making (see chart for details).  Clinical Course    75 yo M With a chief complaint of epigastric abdominal pain. Going on for the past day. Having an on and off for a week. Patient denies any exertional symptoms. Usually improves after belching. Denies history of prior MI. Troponin at patient's baseline. EKG with no significant changes. Chest x-ray unchanged from prior study by me. BNP is at baseline. Patient feeling much better on reassessment after GI cocktail. He is currently requesting discharge at this time. I discussed utility of second troponin he is declining at this time.  2:58 PM:  I have discussed the diagnosis/risks/treatment options with the patient and family and believe the pt to be eligible for discharge home to follow-up with PCP. We also discussed returning to the ED immediately if new or  worsening sx occur. We discussed the sx which are most concerning (e.g., sudden worsening pain, fever, inability to tolerate by mouth) that necessitate immediate return. Medications administered to the patient during their visit and any new prescriptions provided to the patient are listed below.  Medications given during this visit Medications  gi cocktail (Maalox,Lidocaine,Donnatal) (30 mLs Oral Given 09/06/16 2109)  aspirin chewable tablet 162 mg (162 mg Oral Given 09/06/16 2107)     The patient appears reasonably screen and/or stabilized for discharge and I doubt any other medical condition or other [facility] requiring  further screening, evaluation, or treatment in the ED at this time prior to discharge.    Final Clinical Impressions(s) / ED Diagnoses   Final diagnoses:  Epigastric pain    New Prescriptions Discharge Medication List as of 09/06/2016 11:20 PM     I personally performed the services described in this documentation, which was scribed in my presence. The recorded information has been reviewed and is accurate.     Deno Etienne, DO 09/07/16 [phone]

## 2016-09-06 NOTE — ED Notes (Signed)
Pt returned from xray

## 2016-09-06 NOTE — ED Notes (Signed)
MD at bedside. 

## 2016-09-06 NOTE — ED Triage Notes (Signed)
Pt c/o epigastric pain x 1 week " off and on"

## 2016-09-06 NOTE — ED Notes (Signed)
Pt verbalizes understanding of d/c instructions and denies any further needs at this time. 

## 2016-12-06 ENCOUNTER — Encounter: Payer: Self-pay | Admitting: Interventional Radiology

## 2016-12-07 ENCOUNTER — Encounter: Payer: Self-pay | Admitting: Interventional Radiology

## 2017-02-02 ENCOUNTER — Emergency Department (HOSPITAL_BASED_OUTPATIENT_CLINIC_OR_DEPARTMENT_OTHER)
Admission: EM | Admit: 2017-02-02 | Discharge: 2017-02-02 | Disposition: A | Discharge status: Home / Self Care | Payer: Medicare Other | Attending: Emergency Medicine | Admitting: Emergency Medicine

## 2017-02-02 ENCOUNTER — Encounter (HOSPITAL_BASED_OUTPATIENT_CLINIC_OR_DEPARTMENT_OTHER): Payer: Self-pay | Admitting: Emergency Medicine

## 2017-02-02 ENCOUNTER — Emergency Department (HOSPITAL_BASED_OUTPATIENT_CLINIC_OR_DEPARTMENT_OTHER): Payer: Medicare Other

## 2017-02-02 DIAGNOSIS — I5023 Acute on chronic systolic (congestive) heart failure: Secondary | ICD-10-CM | POA: Insufficient documentation

## 2017-02-02 DIAGNOSIS — R0602 Shortness of breath: Secondary | ICD-10-CM | POA: Insufficient documentation

## 2017-02-02 DIAGNOSIS — J449 Chronic obstructive pulmonary disease, unspecified: Secondary | ICD-10-CM | POA: Insufficient documentation

## 2017-02-02 DIAGNOSIS — Z87891 Personal history of nicotine dependence: Secondary | ICD-10-CM | POA: Insufficient documentation

## 2017-02-02 DIAGNOSIS — Z79899 Other long term (current) drug therapy: Secondary | ICD-10-CM | POA: Insufficient documentation

## 2017-02-02 DIAGNOSIS — I2510 Atherosclerotic heart disease of native coronary artery without angina pectoris: Secondary | ICD-10-CM | POA: Insufficient documentation

## 2017-02-02 DIAGNOSIS — Z7982 Long term (current) use of aspirin: Secondary | ICD-10-CM | POA: Diagnosis not present

## 2017-02-02 DIAGNOSIS — I110 Hypertensive heart disease with heart failure: Secondary | ICD-10-CM | POA: Insufficient documentation

## 2017-02-02 LAB — CBC WITH DIFFERENTIAL/PLATELET
BASOS PCT: 1 %
Basophils Absolute: 0 10*3/uL (ref 0.0–0.1)
EOS ABS: 0 10*3/uL (ref 0.0–0.7)
Eosinophils Relative: 1 %
HCT: 42.1 % (ref 39.0–52.0)
HEMOGLOBIN: 14.5 g/dL (ref 13.0–17.0)
LYMPHS ABS: 1.9 10*3/uL (ref 0.7–4.0)
Lymphocytes Relative: 44 %
MCH: 28.6 pg (ref 26.0–34.0)
MCHC: 34.4 g/dL (ref 30.0–36.0)
MCV: 83 fL (ref 78.0–100.0)
MONOS PCT: 13 %
Monocytes Absolute: 0.6 10*3/uL (ref 0.1–1.0)
NEUTROS ABS: 1.7 10*3/uL (ref 1.7–7.7)
NEUTROS PCT: 41 %
Platelets: 127 10*3/uL — ABNORMAL LOW (ref 150–400)
RBC: 5.07 MIL/uL (ref 4.22–5.81)
RDW: 14.8 % (ref 11.5–15.5)
WBC: 4.2 10*3/uL (ref 4.0–10.5)

## 2017-02-02 LAB — BASIC METABOLIC PANEL
Anion gap: 5 (ref 5–15)
BUN: 24 mg/dL — ABNORMAL HIGH (ref 6–20)
CHLORIDE: 107 mmol/L (ref 101–111)
CO2: 29 mmol/L (ref 22–32)
Calcium: 9 mg/dL (ref 8.9–10.3)
Creatinine, Ser: 1.57 mg/dL — ABNORMAL HIGH (ref 0.61–1.24)
GFR calc non Af Amer: 41 mL/min — ABNORMAL LOW (ref >60)
GFR, EST AFRICAN AMERICAN: 48 mL/min — AB (ref >60)
Glucose, Bld: 97 mg/dL (ref 65–99)
POTASSIUM: 4.9 mmol/L (ref 3.5–5.1)
SODIUM: 141 mmol/L (ref 135–145)

## 2017-02-02 LAB — BRAIN NATRIURETIC PEPTIDE: B Natriuretic Peptide: 1675.6 pg/mL — ABNORMAL HIGH (ref 0.0–100.0)

## 2017-02-02 LAB — TROPONIN I: Troponin I: 0.05 ng/mL (ref <0.03)

## 2017-02-02 NOTE — ED Notes (Signed)
Attempted assessment, but pt continued to talk on phone.

## 2017-02-02 NOTE — ED Triage Notes (Signed)
Patient reports shortness of breath x 2 days.  Reports worsening shortness of breath on exertion. States he is unable to lay back in his recliner and has been having to sit up due to shortness of breath.

## 2017-02-02 NOTE — Discharge Instructions (Signed)
It is important that your follow-up with your doctor on Monday or Tuesday.    Please stay hydrated and keep taking your fluid pill.   If your symptoms worsen, you will need to return to the ED.

## 2017-02-02 NOTE — ED Provider Notes (Signed)
[facility] DEPT [facility] Provider Note   CSN: [id number] Arrival date & time: 02/02/17  1820  By signing my name below, I, Jeanell Sparrow, attest that this documentation has been prepared under the direction and in the presence of non-physician practitioner, Montine Circle, PA-C. Electronically Signed: Jeanell Sparrow, Scribe. 02/02/2017. 7:11 PM.  History   Chief Complaint Chief Complaint  Patient presents with  . Shortness of Breath   The history is provided by the patient. No language interpreter was used.   HPI Comments: Spencer Rice is a 76 y.o. male with a PMHx of CHF who presents to the Emergency Department complaining of intermittent SOB that started about 2 days ago. No alleviating factors. His SOB is worse with exertion. He denies any fever, cough, chest pain, or leg swelling.     PCP: [name], MD  Past Medical History:  Diagnosis Date  . AICD (automatic cardioverter/defibrillator) present   . Cancer of left kidney ([place])    S/P OR 05/2014  . CHF (congestive heart failure) ([place])   . Coronary artery disease   . DVT (deep venous thrombosis) ([facility]) 1983   LLE  . GERD (gastroesophageal reflux disease)   . History of hiatal hernia   . Hypertension     Patient Active Problem List   Diagnosis Date Noted  . Glucose intolerance (impaired glucose tolerance) 06/23/2016  . Vitreous floaters 12/08/2015  . TIA (transient ischemic attack) 12/08/2015  . SOB (shortness of breath) 12/08/2015  . Posterior vitreous detachment of both eyes 12/08/2015  . Pain due to dental caries 12/08/2015  . Other abnormal glucose 12/08/2015  . Osteoarthrosis 12/08/2015  . Long term (current) use of anticoagulants 12/08/2015  . Kidney mass 12/08/2015  . Hypersomnolence 12/08/2015  . Hyperopia with presbyopia of both eyes 12/08/2015  . Hyperlipidemia 12/08/2015  . History of orthopnea 12/08/2015  . COPD (chronic obstructive pulmonary disease) ([place]) 12/08/2015  . Apical mural thrombus  without MI 12/08/2015  . AKI (acute kidney injury) ([place]) 12/08/2015  . Abnormal CT scan, kidney 12/08/2015  . Abdominal bloating 12/08/2015  . Biventricular ICD (implantable cardioverter-defibrillator) in place 12/07/2015  . Chest pain at rest 11/29/2015  . Essential hypertension 11/29/2015  . Acute on chronic systolic CHF (congestive heart failure), NYHA class 1 ([place]) 11/29/2015  . Gastroesophageal reflux disease without esophagitis 11/29/2015  . Pain in the chest   . Renal cancer ([place]) 08/16/2015  . 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 Cancer of kidney ([place]) 01/19/2015  . Hematuria 09/04/2014  . Psychosexual dysfunction with inhibited sexual excitement 07/29/2014  . Pelvic pain in male 07/29/2014  . Increased urinary frequency 07/29/2014  . Heartburn 07/29/2014  . EKG, abnormal 07/29/2014  . Corneal scar 07/29/2014  . 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 Cardiomyopathy ([place]) 07/29/2014  . CAD (coronary artery disease) 07/29/2014  . Disorder of kidney and ureter 04/09/2014    Past Surgical History:  Procedure Laterality Date  . CARDIAC CATHETERIZATION  1980's  . IMPLANTABLE CARDIOVERTER DEFIBRILLATOR IMPLANT  07/21/2010  . IR GENERIC HISTORICAL  12/23/2014   IR RADIOLOGIST EVAL & MGMT 12/23/2014 [name], MD [facility] INTERV RAD  . IR GENERIC HISTORICAL  07/20/2016   IR RADIOLOGIST EVAL & MGMT 07/20/2016 [facility] INTERV RAD  . RENAL CRYOABLATION Left 05/2014       Home Medications    Prior to Admission medications   Medication Sig Start Date End Date Taking? Authorizing Provider  aspirin 81 MG chewable tablet Chew 81 mg by  mouth.    Historical Provider, MD  carvedilol (COREG) 12.5 MG tablet Take 12.5 mg by mouth 2 (two) times daily with a meal.     Historical Provider, MD  esomeprazole (NEXIUM) 20 MG capsule Take 20 mg by mouth daily as needed (FOR HEARTBURN).    Historical Provider, MD  fosinopril (MONOPRIL) 10 MG tablet Take 5 mg by mouth daily.     Historical Provider, MD  furosemide (LASIX) 20 MG tablet Take 1.5 tablets (30 mg total) by mouth every morning. 12/04/15   Hosie Poisson, MD  warfarin (COUMADIN) 5 MG tablet Take 5 mg by mouth daily.    Historical Provider, MD    Family History Family History  Problem Relation Age of Onset  . Heart disease Father   . Heart attack Father 80  . Hypertension Father   . Hypertension Mother     Social History Social History  Substance Use Topics  . Smoking status: Former Smoker    Packs/day: 0.75    Years: 17.00    Types: Cigarettes    Start date: 05/08/1959    Quit date: 06/20/1976  . Smokeless tobacco: Never Used  . Alcohol use No     Comment: 11/29/2015 "stopped drinking in 1977"     Allergies   Patient has no known allergies.   Review of Systems Review of Systems  Constitutional: Negative for fever.  Respiratory: Positive for shortness of breath. Negative for cough.   Cardiovascular: Negative for chest pain and leg swelling.  All other systems reviewed and are negative.    Physical Exam Updated Vital Signs BP 103/58 (BP Location: Left Arm)   Pulse 64   Temp 98.1 F (36.7 C) (Oral)   Resp 18   Ht 6\' 1"  (1.854 m)   Wt 220 lb (99.8 kg)   SpO2 100%   BMI 29.03 kg/m   Physical Exam  Constitutional: He appears well-developed and well-nourished. No distress.  HENT:  Head: Normocephalic and atraumatic.  Eyes: Conjunctivae are normal.  Neck: Neck supple.  Cardiovascular: Normal rate, regular rhythm and normal heart sounds.  Exam reveals no gallop and no friction rub.   No murmur heard. Pulmonary/Chest: Effort normal and breath sounds normal. No respiratory distress. He has no wheezes. He has no rales.  CTAB  Abdominal: Soft.  Musculoskeletal: Normal range of motion. He exhibits no edema.  No BLE swelling.   Neurological: He is alert.  Skin: Skin is warm and dry.  Psychiatric: He has a normal mood and affect.  Nursing note and vitals reviewed.    ED  Treatments / Results  DIAGNOSTIC STUDIES: Oxygen Saturation is 100% on RA, normal by my interpretation.    COORDINATION OF CARE: 7:15 PM- Pt advised of plan for treatment and pt agrees.  Labs (all labs ordered are listed, but only abnormal results are displayed) Labs Reviewed  TROPONIN I - Abnormal; Notable for the following:       Result Value   Troponin I 0.05 (*)    All other components within normal limits  BRAIN NATRIURETIC PEPTIDE - Abnormal; Notable for the following:    B Natriuretic Peptide 1,675.6 (*)    All other components within normal limits  CBC WITH DIFFERENTIAL/PLATELET - Abnormal; Notable for the following:    Platelets 127 (*)    All other components within normal limits  BASIC METABOLIC PANEL - Abnormal; Notable for the following:    BUN 24 (*)    Creatinine, Ser 1.57 (*)  GFR calc non Af Amer 41 (*)    GFR calc Af Amer 48 (*)    All other components within normal limits    EKG  EKG Interpretation  Date/Time:  Friday February 02 2017 18:37:34 EST Ventricular Rate:  57 PR Interval:    QRS Duration: 178 QT Interval:  490 QTC Calculation: 478 R Axis:   -61 Text Interpretation:  Atrial-sensed ventricular-paced complexes No further analysis attempted due to paced rhythm When compared to prior, new t wave inversions in leads V3-V6 No STEMI Confirmed by Sherry Ruffing MD, [place] [phone]) on 02/02/2017 8:29:22 PM       Radiology Dg Chest 2 View  Result Date: 02/02/2017 CLINICAL DATA:  Shortness of breath for 2 days. EXAM: CHEST  2 VIEW COMPARISON:  09/27/2016 FINDINGS: Multi lead left-sided pacemaker remains in place. The heart is enlarged, unchanged from prior exam. Mild vascular congestion without pulmonary edema. No focal airspace disease, pleural fluid or pneumothorax. Unchanged osseous structures. IMPRESSION: Stable cardiomegaly. Mild vascular congestion without pulmonary edema. Electronically Signed   By: Jeb Levering M.D.   On: 02/02/2017 19:42     Procedures Procedures (including critical care time)  Medications Ordered in ED Medications - No data to display   Initial Impression / Assessment and Plan / ED Course  I have reviewed the triage vital signs and the nursing notes.  Pertinent labs & imaging results that were available during my care of the patient were reviewed by me and considered in my medical decision making (see chart for details).     Patient with shortness of breath. He has a history of CHF. He takes Lasix. He states that he has been drinking more water recently because he has been working out in the yard. Takes that he has had some shortness of breath, but does not appear to be in any distress now. His lung sounds are clear. He is not hypoxic. He does not have a fever. Chest x-ray shows mild vascular congestion without pulmonary edema. His BNP is elevated, but lower than normal.  Patient seen by and discussed with Dr. Sherry Ruffing, who agrees with plan for discharge to home with PCP follow-up in 3 days.  Continue meds as directed.  Final Clinical Impressions(s) / ED Diagnoses   Final diagnoses:  SOB (shortness of breath)    New Prescriptions Discharge Medication List as of 02/02/2017 10:58 PM     I personally performed the services described in this documentation, which was scribed in my presence. The recorded information has been reviewed and is accurate.      Montine Circle, PA-C 02/03/17 0019    Gwenyth Allegra Tegeler, MD 02/10/17 1714

## 2017-02-02 NOTE — ED Notes (Signed)
ED Provider at bedside. 

## 2017-06-20 ENCOUNTER — Other Ambulatory Visit: Payer: Self-pay | Admitting: *Deleted

## 2017-06-20 ENCOUNTER — Other Ambulatory Visit (HOSPITAL_COMMUNITY): Payer: Self-pay | Admitting: Interventional Radiology

## 2017-06-20 DIAGNOSIS — C642 Malignant neoplasm of left kidney, except renal pelvis: Secondary | ICD-10-CM

## 2017-06-27 ENCOUNTER — Emergency Department (HOSPITAL_BASED_OUTPATIENT_CLINIC_OR_DEPARTMENT_OTHER): Payer: Medicare Other

## 2017-06-27 ENCOUNTER — Emergency Department (HOSPITAL_BASED_OUTPATIENT_CLINIC_OR_DEPARTMENT_OTHER)
Admission: EM | Admit: 2017-06-27 | Discharge: 2017-06-27 | Disposition: A | Discharge status: Home / Self Care | Payer: Medicare Other | Attending: Emergency Medicine | Admitting: Emergency Medicine

## 2017-06-27 ENCOUNTER — Encounter (HOSPITAL_BASED_OUTPATIENT_CLINIC_OR_DEPARTMENT_OTHER): Payer: Self-pay

## 2017-06-27 DIAGNOSIS — J449 Chronic obstructive pulmonary disease, unspecified: Secondary | ICD-10-CM | POA: Diagnosis not present

## 2017-06-27 DIAGNOSIS — Z95810 Presence of automatic (implantable) cardiac defibrillator: Secondary | ICD-10-CM | POA: Insufficient documentation

## 2017-06-27 DIAGNOSIS — R0602 Shortness of breath: Secondary | ICD-10-CM | POA: Diagnosis present

## 2017-06-27 DIAGNOSIS — R0601 Orthopnea: Secondary | ICD-10-CM

## 2017-06-27 DIAGNOSIS — Z87891 Personal history of nicotine dependence: Secondary | ICD-10-CM | POA: Insufficient documentation

## 2017-06-27 DIAGNOSIS — I5023 Acute on chronic systolic (congestive) heart failure: Secondary | ICD-10-CM | POA: Insufficient documentation

## 2017-06-27 DIAGNOSIS — Z7982 Long term (current) use of aspirin: Secondary | ICD-10-CM | POA: Diagnosis not present

## 2017-06-27 DIAGNOSIS — Z85528 Personal history of other malignant neoplasm of kidney: Secondary | ICD-10-CM | POA: Insufficient documentation

## 2017-06-27 DIAGNOSIS — I110 Hypertensive heart disease with heart failure: Secondary | ICD-10-CM | POA: Insufficient documentation

## 2017-06-27 DIAGNOSIS — I2510 Atherosclerotic heart disease of native coronary artery without angina pectoris: Secondary | ICD-10-CM | POA: Insufficient documentation

## 2017-06-27 DIAGNOSIS — Z7901 Long term (current) use of anticoagulants: Secondary | ICD-10-CM | POA: Diagnosis not present

## 2017-06-27 DIAGNOSIS — Z79899 Other long term (current) drug therapy: Secondary | ICD-10-CM | POA: Insufficient documentation

## 2017-06-27 LAB — CBC WITH DIFFERENTIAL/PLATELET
Basophils Absolute: 0 10*3/uL (ref 0.0–0.1)
Basophils Relative: 1 %
EOS PCT: 1 %
Eosinophils Absolute: 0.1 10*3/uL (ref 0.0–0.7)
HEMATOCRIT: 41.7 % (ref 39.0–52.0)
Hemoglobin: 14.8 g/dL (ref 13.0–17.0)
LYMPHS ABS: 1.6 10*3/uL (ref 0.7–4.0)
LYMPHS PCT: 38 %
MCH: 29.1 pg (ref 26.0–34.0)
MCHC: 35.5 g/dL (ref 30.0–36.0)
MCV: 81.9 fL (ref 78.0–100.0)
MONO ABS: 0.5 10*3/uL (ref 0.1–1.0)
Monocytes Relative: 12 %
Neutro Abs: 2.1 10*3/uL (ref 1.7–7.7)
Neutrophils Relative %: 48 %
PLATELETS: 147 10*3/uL — AB (ref 150–400)
RBC: 5.09 MIL/uL (ref 4.22–5.81)
RDW: 14.7 % (ref 11.5–15.5)
WBC: 4.3 10*3/uL (ref 4.0–10.5)

## 2017-06-27 LAB — BASIC METABOLIC PANEL
Anion gap: 8 (ref 5–15)
BUN: 21 mg/dL — AB (ref 6–20)
CHLORIDE: 104 mmol/L (ref 101–111)
CO2: 27 mmol/L (ref 22–32)
Calcium: 9 mg/dL (ref 8.9–10.3)
Creatinine, Ser: 1.37 mg/dL — ABNORMAL HIGH (ref 0.61–1.24)
GFR calc Af Amer: 57 mL/min — ABNORMAL LOW (ref >60)
GFR calc non Af Amer: 49 mL/min — ABNORMAL LOW (ref >60)
GLUCOSE: 109 mg/dL — AB (ref 65–99)
POTASSIUM: 4.4 mmol/L (ref 3.5–5.1)
Sodium: 139 mmol/L (ref 135–145)

## 2017-06-27 LAB — TROPONIN I: Troponin I: 0.04 ng/mL (ref <0.03)

## 2017-06-27 LAB — BRAIN NATRIURETIC PEPTIDE: B Natriuretic Peptide: 1437.1 pg/mL — ABNORMAL HIGH (ref 0.0–100.0)

## 2017-06-27 LAB — PROTIME-INR
INR: 3.05
Prothrombin Time: 32.2 seconds — ABNORMAL HIGH (ref 11.4–15.2)

## 2017-06-27 NOTE — ED Provider Notes (Signed)
[facility] Provider Note   CSN: [id number] Arrival date & time: 06/27/17  1631     History   Chief Complaint Chief Complaint  Patient presents with  . Shortness of Breath    HPI Koehn Salehi is a 76 y.o. male.  The history is provided by the patient. No language interpreter was used.  Shortness of Breath     Sonnie Pawloski is a 76 y.o. male who presents to the Emergency Department complaining of sob.  He reports awakening with shortness of breath at 2 AM this morning. He had to use 2 pillows to prop himself up. Typically when he awakes with shortness of breath he urinates and then his shortness of breath improves. Today he went to urinate and he had persistent shortness of breath with laying down. He reports a mild associated headache, which has resolved after taking Aleve. Currently his shortness of breath is completely resolved. He denies any fevers, cough, leg swelling or pain. No weight gain. He has a history of CHF with AICD placement, DVT and takes Coumadin.  Past Medical History:  Diagnosis Date  . AICD (automatic cardioverter/defibrillator) present   . Cancer of left kidney ([place])    S/P OR 05/2014  . CHF (congestive heart failure) ([place])   . Coronary artery disease   . DVT (deep venous thrombosis) ([facility]) 1983   LLE  . GERD (gastroesophageal reflux disease)   . History of hiatal hernia   . Hypertension     Patient Active Problem List   Diagnosis Date Noted  . Glucose intolerance (impaired glucose tolerance) 06/23/2016  . Vitreous floaters 12/08/2015  . TIA (transient ischemic attack) 12/08/2015  . SOB (shortness of breath) 12/08/2015  . Posterior vitreous detachment of both eyes 12/08/2015  . Pain due to dental caries 12/08/2015  . Other abnormal glucose 12/08/2015  . Osteoarthrosis 12/08/2015  . Long term (current) use of anticoagulants 12/08/2015  . Kidney mass 12/08/2015  . Hypersomnolence 12/08/2015  . Hyperopia with presbyopia of both  eyes 12/08/2015  . Hyperlipidemia 12/08/2015  . History of orthopnea 12/08/2015  . COPD (chronic obstructive pulmonary disease) ([place]) 12/08/2015  . 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 AKI (acute kidney injury) ([place]) 12/08/2015  . Abnormal CT scan, kidney 12/08/2015  . Abdominal bloating 12/08/2015  . Biventricular ICD (implantable cardioverter-defibrillator) in place 12/07/2015  . Chest pain at rest 11/29/2015  . Essential hypertension 11/29/2015  . Acute on chronic systolic CHF (congestive heart failure), NYHA class 1 ([place]) 11/29/2015  . Gastroesophageal reflux disease without esophagitis 11/29/2015  . Pain in the chest   . Renal cancer ([place]) 08/16/2015  . 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 Cancer of kidney ([place]) 01/19/2015  . Hematuria 09/04/2014  . Psychosexual dysfunction with inhibited sexual excitement 07/29/2014  . Pelvic pain in male 07/29/2014  . Increased urinary frequency 07/29/2014  . Heartburn 07/29/2014  . EKG, abnormal 07/29/2014  . Corneal scar 07/29/2014  . 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 Cardiomyopathy ([place]) 07/29/2014  . CAD (coronary artery disease) 07/29/2014  . Disorder of kidney and ureter 04/09/2014    Past Surgical History:  Procedure Laterality Date  . CARDIAC CATHETERIZATION  1980's  . IMPLANTABLE CARDIOVERTER DEFIBRILLATOR IMPLANT  07/21/2010  . IR GENERIC HISTORICAL  12/23/2014   IR RADIOLOGIST EVAL & MGMT 12/23/2014 [name], MD [facility] INTERV RAD  . IR GENERIC HISTORICAL  07/20/2016   IR RADIOLOGIST EVAL & MGMT 07/20/2016 [facility] INTERV RAD  .  RENAL CRYOABLATION Left 05/2014       Home Medications    Prior to Admission medications   Medication Sig Start Date End Date Taking? Authorizing Provider  aspirin 81 MG chewable tablet Chew 81 mg by mouth.    [provider]  carvedilol (COREG) 12.5 MG tablet Take 12.5 mg by mouth 2 (two) times daily with a meal.     [provider]    esomeprazole (NEXIUM) 20 MG capsule Take 20 mg by mouth daily as needed (FOR HEARTBURN).    [provider]  fosinopril (MONOPRIL) 10 MG tablet Take 5 mg by mouth daily.    [provider]  furosemide (LASIX) 20 MG tablet Take 1.5 tablets (30 mg total) by mouth every morning. 12/04/15   Hosie Poisson, MD  warfarin (COUMADIN) 5 MG tablet Take 5 mg by mouth daily.    [provider]    Family History Family History  Problem Relation Age of Onset  . Heart disease Father   . Heart attack Father 65  . Hypertension Father   . Hypertension Mother     Social History Social History  Substance Use Topics  . Smoking status: Former Smoker    Packs/day: 0.75    Years: 17.00    Types: Cigarettes    Start date: 05/08/1959    Quit date: 06/20/1976  . Smokeless tobacco: Never Used  . Alcohol use No     Allergies   Patient has no known allergies.   Review of Systems Review of Systems  Respiratory: Positive for shortness of breath.   All other systems reviewed and are negative.    Physical Exam Updated Vital Signs BP 109/62   Pulse 67   Temp 98.4 F (36.9 C)   Resp 18   Ht 6' (1.829 m)   Wt 98.4 kg (217 lb)   SpO2 98%   BMI 29.43 kg/m   Physical Exam  Constitutional: He is oriented to person, place, and time. He appears well-developed and well-nourished.  HENT:  Head: Normocephalic and atraumatic.  Neck: No JVD present.  Cardiovascular: Normal rate and regular rhythm.  Exam reveals no gallop.   No murmur heard. Pulmonary/Chest: Effort normal and breath sounds normal. No respiratory distress.  Abdominal: Soft. There is no tenderness. There is no rebound and no guarding.  Musculoskeletal: He exhibits no edema or tenderness.  Neurological: He is alert and oriented to person, place, and time.  Skin: Skin is warm and dry.  Psychiatric: He has a normal mood and affect. His behavior is normal.  Nursing note and vitals reviewed.    ED Treatments /  Results  Labs (all labs ordered are listed, but only abnormal results are displayed) Labs Reviewed  BASIC METABOLIC PANEL - Abnormal; Notable for the following:       Result Value   Glucose, Bld 109 (*)    BUN 21 (*)    Creatinine, Ser 1.37 (*)    GFR calc non Af Amer 49 (*)    GFR calc Af Amer 57 (*)    All other components within normal limits  CBC WITH DIFFERENTIAL/PLATELET - Abnormal; Notable for the following:    Platelets 147 (*)    All other components within normal limits  TROPONIN I - Abnormal; Notable for the following:    Troponin I 0.04 (*)    All other components within normal limits  BRAIN NATRIURETIC PEPTIDE - Abnormal; Notable for the following:    B Natriuretic Peptide 1,437.1 (*)  All other components within normal limits  PROTIME-INR - Abnormal; Notable for the following:    Prothrombin Time 32.2 (*)    All other components within normal limits    EKG  EKG Interpretation  Date/Time:  Wednesday June 27 2017 16:41:28 EDT Ventricular Rate:  64 PR Interval:    QRS Duration: 149 QT Interval:  454 QTC Calculation: 469 R Axis:   -48 Text Interpretation:  Atrial-sensed ventricular-paced complexes No further analysis attempted due to paced rhythm No significant change since last tracing Confirmed by Quintella Reichert ([id number]) on 06/27/2017 6:26:56 PM       Radiology Dg Chest 2 View  Result Date: 06/27/2017 CLINICAL DATA:  76 year old male with shortness of breath beginning at 2 a.m. this morning EXAM: CHEST  2 VIEW COMPARISON:  Prior chest x-ray 02/02/2017 FINDINGS: Stable biventricular left cardiac rhythm maintenance device. Cardiac leads project over the right atrium, right ventricle and overlying the left ventricle. Stable cardiomegaly. Atherosclerotic calcifications again noted in the transverse aorta. The lungs are clear. No acute osseous abnormality. IMPRESSION: 1. No acute cardiopulmonary process. 2. Stable cardiomegaly. Electronically Signed   By: Jacqulynn Cadet M.D.   On: 06/27/2017 18:19    Procedures Procedures (including critical care time)  Medications Ordered in ED Medications - No data to display   Initial Impression / Assessment and Plan / ED Course  I have reviewed the triage vital signs and the nursing notes.  Pertinent labs & imaging results that were available during my care of the patient were reviewed by me and considered in my medical decision making (see chart for details).     Patient with history of CHF, CAD, DVT on Coumadin here for evaluation of dyspnea/orthopnea. He has no significant symptoms in the emergency department and able to lay supine without difficulties. He is euvolemic on examination and EKG is at his baseline. Troponin is mildly elevated and that is at his baseline. BMP and BNP are also at his baseline. No evidence of acute CHF except some patient, acute renal failure. Current clinical picture is not consistent with ACS or PE. Given patient's significant improvement in his symptoms plan to DC home with outpatient follow-up and return precautions.  Final Clinical Impressions(s) / ED Diagnoses   Final diagnoses:  Orthopnea  Shortness of breath    New Prescriptions New Prescriptions   No medications on file     Quintella Reichert, MD 06/27/17 [phone]

## 2017-06-27 NOTE — ED Triage Notes (Signed)
C/o SOB started 2am-denies pain-NAD-steady gait

## 2017-07-04 ENCOUNTER — Ambulatory Visit
Admission: RE | Admit: 2017-07-04 | Discharge: 2017-07-04 | Disposition: A | Discharge status: Home / Self Care | Payer: Medicare Other | Source: Ambulatory Visit | Attending: Interventional Radiology | Admitting: Interventional Radiology

## 2017-07-04 DIAGNOSIS — C642 Malignant neoplasm of left kidney, except renal pelvis: Secondary | ICD-10-CM

## 2017-07-04 HISTORY — PX: IR RADIOLOGIST EVAL & MGMT: IMG5224

## 2017-07-04 NOTE — Progress Notes (Signed)
Chief Complaint: Status post cryoablation of a left clear cell renal carcinoma on 06/18/2014.  History of Present Illness: Spencer Rice is a 76 y.o. 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 He has been doing well and is asymptomatic. He had an ICD generator replaced in October of last year by Dr. Minna Rice. 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  Past Medical History:  Diagnosis Date  . AICD (automatic cardioverter/defibrillator) present   . Cancer of left kidney ([place] [facility])    S/P OR 05/2014  . CHF (congestive heart failure) ([place])   . Coronary artery disease   . DVT (deep venous thrombosis) ([facility]) 1983   LLE  . GERD (gastroesophageal reflux disease)   . History of hiatal hernia   . Hypertension     Past Surgical History:  Procedure Laterality Date  . CARDIAC CATHETERIZATION  1980's  . IMPLANTABLE CARDIOVERTER DEFIBRILLATOR IMPLANT  07/21/2010  . IR GENERIC HISTORICAL  12/23/2014   IR RADIOLOGIST EVAL & MGMT 12/23/2014 Spencer Edouard, MD [facility] INTERV RAD  . IR GENERIC HISTORICAL  07/20/2016   IR RADIOLOGIST EVAL & MGMT 07/20/2016 [facility] INTERV RAD  . RENAL CRYOABLATION Left 05/2014    Allergies: Patient has no known allergies.  Medications: Prior to Admission medications   Medication Sig Start Date End Date Taking? Authorizing Provider  aspirin 81 MG chewable tablet Chew 81 mg by mouth.    [provider]  carvedilol (COREG) 12.5 MG tablet Take 12.5 mg by mouth 2 (two) times daily with a meal.     [provider]  esomeprazole (NEXIUM) 20 MG capsule Take 20 mg by mouth daily as needed (FOR HEARTBURN).    [provider]  fosinopril (MONOPRIL) 10 MG tablet Take 5 mg by mouth daily.    [provider]  furosemide (LASIX) 20 MG tablet Take 1.5 tablets (30 mg total) by mouth every morning. 12/04/15   Hosie Poisson, MD  warfarin (COUMADIN) 5 MG tablet Take 5  mg by mouth daily.    [provider]     Family History  Problem Relation Age of Onset  . Heart disease Father   . Heart attack Father 20  . Hypertension Father   . Hypertension Mother     Social History   Social History  . Marital status: Married    Spouse name: N/A  . Number of children: N/A  . Years of education: N/A   Social History Main Topics  . Smoking status: Former Smoker    Packs/day: 0.75    Years: 17.00    Types: Cigarettes    Start date: 05/08/1959    Quit date: 06/20/1976  . Smokeless tobacco: Never Used  . Alcohol use No  . Drug use: No  . Sexual activity: Not on file   Other Topics Concern  . Not on file   Social History Narrative  . No narrative on file    ECOG Status: 0 - Asymptomatic  Review of Systems: A 12 point ROS discussed and pertinent positives are indicated in the HPI above.  All other systems are negative.  Review of Systems  Constitutional: Negative.   Respiratory: Negative.   Cardiovascular: Negative.   Gastrointestinal: Negative.   Genitourinary: Negative.   Musculoskeletal: Negative.   Neurological: Negative.     Vital Signs: BP 91/68   Pulse 61   Temp 98.3 F (36.8 C) (Oral)  Resp 15   Ht 6\' 1"  (1.854 m)   Wt 215 lb (97.5 kg)   SpO2 99%   BMI 28.37 kg/m   Physical Exam  Constitutional: He is oriented to person, place, and time. He appears well-developed and well-nourished. No distress.  Abdominal: Soft. He exhibits no distension. There is no tenderness. There is no rebound and no guarding.  Musculoskeletal: He exhibits no edema.  Neurological: He is alert and oriented to person, place, and time.  Skin: He is not diaphoretic.  Nursing note and vitals reviewed.    Imaging: Dg Chest 2 View  Result Date: 06/27/2017 CLINICAL DATA:  76 year old male with shortness of breath beginning at 2 a.m. this morning EXAM: CHEST  2 VIEW COMPARISON:  Prior chest x-ray 02/02/2017 FINDINGS: Stable biventricular left  cardiac rhythm maintenance device. Cardiac leads project over the right atrium, right ventricle and overlying the left ventricle. Stable cardiomegaly. Atherosclerotic calcifications again noted in the transverse aorta. The lungs are clear. No acute osseous abnormality. IMPRESSION: 1. No acute cardiopulmonary process. 2. Stable cardiomegaly. Electronically Signed   By: Spencer Rice M.D.   On: 06/27/2017 18:19    Labs:  CBC:  Recent Labs  09/06/16 2055 02/02/17 1936 06/27/17 1700  WBC 5.0 4.2 4.3  HGB 14.1 14.5 14.8  HCT 40.8 42.1 41.7  PLT 132* 127* 147*    COAGS:  Recent Labs  06/27/17 1700  INR 3.05    BMP:  Recent Labs  09/06/16 2055 02/02/17 1936 06/27/17 1700  NA 140 141 139  K 3.7 4.9 4.4  CL 108 107 104  CO2 24 29 27   GLUCOSE 136* 97 109*  BUN 27* 24* 21*  CALCIUM 9.0 9.0 9.0  CREATININE 1.28* 1.57* 1.37*  GFRNONAA 53* 41* 49*  GFRAA >60 48* 57*     Assessment and Plan:  A follow-up CT of the abdomen was performed on 07/02/2017. This demonstrates no evidence of tumor recurrence at the site of prior ablation of the left kidney. Post ablation scar appears slightly smaller in dimensions compared to imaging last year. No new suspicious renal lesions are identified. I recommended another follow-up CT scan in one year. Mr. Spencer Rice is agreeable with this plan.   Electronically SignedAletta Rice T 07/04/2017, 2:09 PM   I spent a total of 15 Minutes in face to face in clinical consultation, greater than 50% of which was counseling/coordinating care post cryoablation of a left renal carcinoma.

## 2017-08-15 ENCOUNTER — Emergency Department (HOSPITAL_BASED_OUTPATIENT_CLINIC_OR_DEPARTMENT_OTHER)
Admission: EM | Admit: 2017-08-15 | Discharge: 2017-08-15 | Disposition: A | Discharge status: Home / Self Care | Payer: Medicare Other | Attending: Emergency Medicine | Admitting: Emergency Medicine

## 2017-08-15 ENCOUNTER — Emergency Department (HOSPITAL_BASED_OUTPATIENT_CLINIC_OR_DEPARTMENT_OTHER): Payer: Medicare Other

## 2017-08-15 ENCOUNTER — Encounter (HOSPITAL_BASED_OUTPATIENT_CLINIC_OR_DEPARTMENT_OTHER): Payer: Self-pay

## 2017-08-15 DIAGNOSIS — J449 Chronic obstructive pulmonary disease, unspecified: Secondary | ICD-10-CM | POA: Insufficient documentation

## 2017-08-15 DIAGNOSIS — Z7982 Long term (current) use of aspirin: Secondary | ICD-10-CM | POA: Diagnosis not present

## 2017-08-15 DIAGNOSIS — R05 Cough: Secondary | ICD-10-CM | POA: Insufficient documentation

## 2017-08-15 DIAGNOSIS — I110 Hypertensive heart disease with heart failure: Secondary | ICD-10-CM | POA: Diagnosis not present

## 2017-08-15 DIAGNOSIS — Z79899 Other long term (current) drug therapy: Secondary | ICD-10-CM | POA: Diagnosis not present

## 2017-08-15 DIAGNOSIS — R059 Cough, unspecified: Secondary | ICD-10-CM

## 2017-08-15 DIAGNOSIS — Z87891 Personal history of nicotine dependence: Secondary | ICD-10-CM | POA: Diagnosis not present

## 2017-08-15 DIAGNOSIS — Z7901 Long term (current) use of anticoagulants: Secondary | ICD-10-CM | POA: Diagnosis not present

## 2017-08-15 DIAGNOSIS — Z85528 Personal history of other malignant neoplasm of kidney: Secondary | ICD-10-CM | POA: Diagnosis not present

## 2017-08-15 DIAGNOSIS — I2510 Atherosclerotic heart disease of native coronary artery without angina pectoris: Secondary | ICD-10-CM | POA: Diagnosis not present

## 2017-08-15 DIAGNOSIS — I5023 Acute on chronic systolic (congestive) heart failure: Secondary | ICD-10-CM | POA: Insufficient documentation

## 2017-08-15 MED ORDER — BENZONATATE 100 MG PO CAPS: 100.0000 mg | ORAL_CAPSULE | Freq: Three times a day (TID) | ORAL | 0 refills | Status: DC

## 2017-08-15 MED ORDER — LORATADINE 10 MG PO TABS: 10.0000 mg | ORAL_TABLET | Freq: Once | ORAL | Status: DC

## 2017-08-15 MED ORDER — BENZONATATE 100 MG PO CAPS
200.0000 mg | ORAL_CAPSULE | Freq: Once | ORAL | Status: AC
Start: 2017-08-15 — End: 2017-08-15
  Administered 2017-08-15: 200 mg via ORAL
  Filled 2017-08-15: qty 2

## 2017-08-15 MED ORDER — LORATADINE 10 MG PO TABS: 10.0000 mg | ORAL_TABLET | Freq: Every day | ORAL | 0 refills | Status: DC

## 2017-08-15 NOTE — ED Triage Notes (Signed)
C/o prod cough x 4 days-NAD-presents to triage in w/c

## 2017-08-15 NOTE — ED Provider Notes (Signed)
[facility] Provider Note   CSN: [id number] Arrival date & time: 08/15/17  2005     History   Chief Complaint Chief Complaint  Patient presents with  . Cough    HPI Spencer Rice is a 76 y.o. male.  The history is provided by the patient.  Cough  This is a new problem. The current episode started more than 2 days ago. The problem occurs every few hours. The problem has not changed since onset.The cough is non-productive. There has been no fever. Pertinent negatives include no chest pain, no chills, no sweats, no weight loss, no ear congestion, no ear pain, no headaches, no rhinorrhea, no sore throat, no myalgias, no shortness of breath, no wheezing and no eye redness. 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 He has tried nothing for the symptoms. The treatment provided no relief. His past medical history does not include asthma.    Past Medical History:  Diagnosis Date  . AICD (automatic cardioverter/defibrillator) present   . Cancer of left kidney ([place])    S/P OR 05/2014  . CHF (congestive heart failure) ([place])   . Coronary artery disease   . DVT (deep venous thrombosis) ([facility]) 1983   LLE  . GERD (gastroesophageal reflux disease)   . History of hiatal hernia   . Hypertension     Patient Active Problem List   Diagnosis Date Noted  . Glucose intolerance (impaired glucose tolerance) 06/23/2016  . Vitreous floaters 12/08/2015  . TIA (transient ischemic attack) 12/08/2015  . SOB (shortness of breath) 12/08/2015  . Posterior vitreous detachment of both eyes 12/08/2015  . Pain due to dental caries 12/08/2015  . Other abnormal glucose 12/08/2015  . Osteoarthrosis 12/08/2015  . Long term (current) use of anticoagulants 12/08/2015  . Kidney mass 12/08/2015  . Hypersomnolence 12/08/2015  . Hyperopia with presbyopia of both eyes 12/08/2015  . Hyperlipidemia 12/08/2015  . History of orthopnea 12/08/2015  . COPD (chronic obstructive  pulmonary disease) ([place]) 12/08/2015  . 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 AKI (acute kidney injury) ([place]) 12/08/2015  . Abnormal CT scan, kidney 12/08/2015  . Abdominal bloating 12/08/2015  . Biventricular ICD (implantable cardioverter-defibrillator) in place 12/07/2015  . Chest pain at rest 11/29/2015  . Essential hypertension 11/29/2015  . Acute on chronic systolic CHF (congestive heart failure), NYHA class 1 ([place]) 11/29/2015  . Gastroesophageal reflux disease without esophagitis 11/29/2015  . Pain in the chest   . Renal cancer ([place]) 08/16/2015  . 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)   . Cancer of kidney ([place]) 01/19/2015  . Hematuria 09/04/2014  . Psychosexual dysfunction with inhibited sexual excitement 07/29/2014  . Pelvic pain in male 07/29/2014  . Increased urinary frequency 07/29/2014  . Heartburn 07/29/2014  . EKG, abnormal 07/29/2014  . Corneal scar 07/29/2014  . 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 Cardiomyopathy ([place]) 07/29/2014  . CAD (coronary artery disease) 07/29/2014  . Disorder of kidney and ureter 04/09/2014    Past Surgical History:  Procedure Laterality Date  . CARDIAC CATHETERIZATION  1980's  . IMPLANTABLE CARDIOVERTER DEFIBRILLATOR IMPLANT  07/21/2010  . IR GENERIC HISTORICAL  12/23/2014   IR RADIOLOGIST EVAL & MGMT 12/23/2014 [name], MD [facility] INTERV RAD  . IR GENERIC HISTORICAL  07/20/2016   IR RADIOLOGIST EVAL & MGMT 07/20/2016 [facility] INTERV RAD  . RENAL CRYOABLATION Left 05/2014       Home Medications    Prior to Admission medications   Medication Sig  Start Date End Date Taking? Authorizing Provider  Fluticasone-Salmeterol (ADVAIR HFA IN) Inhale into the lungs.   Yes [provider]  aspirin 81 MG chewable tablet Chew 81 mg by mouth.    [provider]  carvedilol (COREG) 12.5 MG tablet Take 12.5 mg by mouth 2 (two) times daily with a meal.     [provider]  esomeprazole (NEXIUM) 20 MG  capsule Take 20 mg by mouth daily as needed (FOR HEARTBURN).    [provider]  fosinopril (MONOPRIL) 10 MG tablet Take 5 mg by mouth daily.    [provider]  furosemide (LASIX) 20 MG tablet Take 1.5 tablets (30 mg total) by mouth every morning. 12/04/15   Hosie Poisson, MD  warfarin (COUMADIN) 5 MG tablet Take 5 mg by mouth daily.    [provider]    Family History Family History  Problem Relation Age of Onset  . Heart disease Father   . Heart attack Father 75  . Hypertension Father   . Hypertension Mother     Social History Social History  Substance Use Topics  . Smoking status: Former Smoker    Packs/day: 0.75    Years: 17.00    Types: Cigarettes    Start date: 05/08/1959    Quit date: 06/20/1976  . Smokeless tobacco: Never Used  . Alcohol use No     Allergies   Patient has no known allergies.   Review of Systems Review of Systems  Constitutional: Negative for chills and weight loss.  HENT: Negative for ear pain, rhinorrhea and sore throat.   Eyes: Negative for redness.  Respiratory: Positive for cough. Negative for shortness of breath and wheezing.   Cardiovascular: Negative for chest pain.  Musculoskeletal: Negative for myalgias.  Neurological: Negative for headaches.  All other systems reviewed and are negative.    Physical Exam Updated Vital Signs BP 104/72 (BP Location: Right Arm)   Pulse 73   Temp 99.8 F (37.7 C) (Oral)   Resp 16   Ht 6\' 1"  (1.854 m)   Wt 97.5 kg (215 lb)   SpO2 98%   BMI 28.37 kg/m   Physical Exam  Constitutional: He is oriented to person, place, and time. He appears well-developed and well-nourished. No distress.  HENT:  Head: Normocephalic and atraumatic.  Mouth/Throat: Oropharynx is clear and moist. No oropharyngeal exudate.  Eyes: Pupils are equal, round, and reactive to light. Conjunctivae are normal.  Neck: Normal range of motion. Neck supple. No JVD present.  Cardiovascular: Normal rate,  regular rhythm, normal heart sounds and intact distal pulses.   Pulmonary/Chest: Effort normal and breath sounds normal. No stridor. No respiratory distress. He has no wheezes. He has no rales.  Abdominal: Soft. Bowel sounds are normal. He exhibits no mass. There is no tenderness. There is no rebound and no guarding.  Musculoskeletal: Normal range of motion.  Lymphadenopathy:    He has no cervical adenopathy.  Neurological: He is alert and oriented to person, place, and time.  Skin: Skin is warm and dry. Capillary refill takes less than 2 seconds.  Psychiatric: He has a normal mood and affect.     ED Treatments / Results   Vitals:   08/15/17 2016 08/15/17 2221  BP: (!) 115/58 104/72  Pulse: 78 73  Resp: 18 16  Temp: 99.8 F (37.7 C)   SpO2: 98% 98%    Radiology Dg Chest 2 View  Result Date: 08/15/2017 CLINICAL DATA:  Productive cough x4 days  EXAM: CHEST  2 VIEW COMPARISON:  06/27/2017 FINDINGS: The stable cardiomegaly with aortic atherosclerosis. Clear lungs without effusion or pneumothorax. ICD device projects over the left hemithorax with leads in the right atrium, right ventricle and coronary sinus. No acute osseous abnormality IMPRESSION: Stable cardiomegaly with aortic atherosclerosis. No active pulmonary disease. Electronically Signed   By: Ashley Royalty M.D.   On: 08/15/2017 20:48    Procedures Procedures (including critical care time)  Medications Ordered in ED Medications  loratadine (CLARITIN) tablet 10 mg (not administered)  benzonatate (TESSALON) capsule 200 mg (not administered)       Final Clinical Impressions(s) / ED Diagnoses  No clinical or radiologic signs of pneumonia.  Hungs are clear.  Will treat symptomatically.  Follow up with your PMD in 2 days for recheck.  Strict return precautions given.    The patient is very well appearing and has been observed in the ED.  Strict return precautions given for  intractable rash, swelling or the lips tongue or  floor of the mouth, chest pain, dyspnea on exertion, new weakness or numbness changes in vision or speech,  Inability to tolerate liquids or food, fevers > 101, rashes on the skin, changes in voice cough, altered mental status or any concerns. No signs of systemic illness or infection. The patient is nontoxic-appearing on exam and vital signs are within normal limits.    I have reviewed the triage vital signs and the nursing notes. Pertinent labs &imaging results that were available during my care of the patient were reviewed by me and considered in my medical decision making (see chart for details).  After history, exam, and medical workup I feel the patient has been appropriately medically screened and is safe for discharge home. Pertinent diagnoses were discussed with the patient. Patient was given return precautions.      Jovahn Breit, MD 08/15/17 2314

## 2017-09-17 ENCOUNTER — Encounter: Payer: Self-pay | Admitting: Interventional Radiology

## 2017-10-30 ENCOUNTER — Other Ambulatory Visit: Payer: Self-pay

## 2017-10-30 ENCOUNTER — Emergency Department (HOSPITAL_BASED_OUTPATIENT_CLINIC_OR_DEPARTMENT_OTHER)
Admission: EM | Admit: 2017-10-30 | Discharge: 2017-10-30 | Disposition: A | Discharge status: Home / Self Care | Payer: Medicare Other | Attending: Physician Assistant | Admitting: Physician Assistant

## 2017-10-30 ENCOUNTER — Emergency Department (HOSPITAL_BASED_OUTPATIENT_CLINIC_OR_DEPARTMENT_OTHER): Payer: Medicare Other

## 2017-10-30 ENCOUNTER — Encounter (HOSPITAL_BASED_OUTPATIENT_CLINIC_OR_DEPARTMENT_OTHER): Payer: Self-pay | Admitting: *Deleted

## 2017-10-30 DIAGNOSIS — R0602 Shortness of breath: Secondary | ICD-10-CM | POA: Diagnosis present

## 2017-10-30 DIAGNOSIS — Z87891 Personal history of nicotine dependence: Secondary | ICD-10-CM | POA: Insufficient documentation

## 2017-10-30 DIAGNOSIS — I5043 Acute on chronic combined systolic (congestive) and diastolic (congestive) heart failure: Secondary | ICD-10-CM | POA: Insufficient documentation

## 2017-10-30 DIAGNOSIS — Z85528 Personal history of other malignant neoplasm of kidney: Secondary | ICD-10-CM | POA: Insufficient documentation

## 2017-10-30 DIAGNOSIS — Z7901 Long term (current) use of anticoagulants: Secondary | ICD-10-CM | POA: Diagnosis not present

## 2017-10-30 DIAGNOSIS — Z95810 Presence of automatic (implantable) cardiac defibrillator: Secondary | ICD-10-CM | POA: Insufficient documentation

## 2017-10-30 DIAGNOSIS — Z7982 Long term (current) use of aspirin: Secondary | ICD-10-CM | POA: Insufficient documentation

## 2017-10-30 DIAGNOSIS — Z79899 Other long term (current) drug therapy: Secondary | ICD-10-CM | POA: Diagnosis not present

## 2017-10-30 DIAGNOSIS — I259 Chronic ischemic heart disease, unspecified: Secondary | ICD-10-CM | POA: Diagnosis not present

## 2017-10-30 DIAGNOSIS — I110 Hypertensive heart disease with heart failure: Secondary | ICD-10-CM | POA: Diagnosis not present

## 2017-10-30 LAB — COMPREHENSIVE METABOLIC PANEL
ALT: 26 U/L (ref 17–63)
ANION GAP: 6 (ref 5–15)
AST: 32 U/L (ref 15–41)
Albumin: 3.7 g/dL (ref 3.5–5.0)
Alkaline Phosphatase: 67 U/L (ref 38–126)
BILIRUBIN TOTAL: 0.9 mg/dL (ref 0.3–1.2)
BUN: 26 mg/dL — AB (ref 6–20)
CALCIUM: 8.9 mg/dL (ref 8.9–10.3)
CO2: 25 mmol/L (ref 22–32)
Chloride: 110 mmol/L (ref 101–111)
Creatinine, Ser: 1.36 mg/dL — ABNORMAL HIGH (ref 0.61–1.24)
GFR calc Af Amer: 57 mL/min — ABNORMAL LOW (ref >60)
GFR, EST NON AFRICAN AMERICAN: 49 mL/min — AB (ref >60)
GLUCOSE: 91 mg/dL (ref 65–99)
Potassium: 3.8 mmol/L (ref 3.5–5.1)
Sodium: 141 mmol/L (ref 135–145)
TOTAL PROTEIN: 7.2 g/dL (ref 6.5–8.1)

## 2017-10-30 LAB — CBC WITH DIFFERENTIAL/PLATELET
Basophils Absolute: 0 10*3/uL (ref 0.0–0.1)
Basophils Relative: 0 %
EOS PCT: 1 %
Eosinophils Absolute: 0 10*3/uL (ref 0.0–0.7)
HEMATOCRIT: 41.7 % (ref 39.0–52.0)
Hemoglobin: 14.3 g/dL (ref 13.0–17.0)
LYMPHS ABS: 1.7 10*3/uL (ref 0.7–4.0)
LYMPHS PCT: 41 %
MCH: 27.9 pg (ref 26.0–34.0)
MCHC: 34.3 g/dL (ref 30.0–36.0)
MCV: 81.3 fL (ref 78.0–100.0)
MONO ABS: 0.5 10*3/uL (ref 0.1–1.0)
Monocytes Relative: 12 %
NEUTROS ABS: 1.9 10*3/uL (ref 1.7–7.7)
Neutrophils Relative %: 46 %
PLATELETS: 136 10*3/uL — AB (ref 150–400)
RBC: 5.13 MIL/uL (ref 4.22–5.81)
RDW: 14.8 % (ref 11.5–15.5)
WBC: 4.1 10*3/uL (ref 4.0–10.5)

## 2017-10-30 LAB — PROTIME-INR
INR: 3.02
Prothrombin Time: 31.1 seconds — ABNORMAL HIGH (ref 11.4–15.2)

## 2017-10-30 LAB — BRAIN NATRIURETIC PEPTIDE: B Natriuretic Peptide: 2375.1 pg/mL — ABNORMAL HIGH (ref 0.0–100.0)

## 2017-10-30 LAB — TROPONIN I: TROPONIN I: 0.05 ng/mL — AB (ref <0.03)

## 2017-10-30 MED ORDER — FUROSEMIDE 10 MG/ML IJ SOLN
30.0000 mg | Freq: Once | INTRAMUSCULAR | Status: AC
  Administered 2017-10-30: 30 mg via INTRAVENOUS
  Filled 2017-10-30: qty 4

## 2017-10-30 NOTE — ED Notes (Signed)
ED Provider at bedside. 

## 2017-10-30 NOTE — Discharge Instructions (Signed)
We recommend you take an extra Lasix pill every day for the next 2-3 days.  you will the need to follow-up with your cardiologist and your primary care physician and recheck your labs next week.  As we discussed week you can try your care at home, however if you have any increase or shortness of breath or other concerns including chest pain please return immediately.

## 2017-10-30 NOTE — ED Triage Notes (Signed)
Pt c/o SOB x 12 hrs worse with laying flat

## 2017-10-30 NOTE — ED Notes (Signed)
Pt to CXR in bed.

## 2017-10-30 NOTE — ED Provider Notes (Signed)
[facility] EMERGENCY DEPARTMENT Provider Note   CSN: [id number] Arrival date & time: 10/30/17  1340     History   Chief Complaint Chief Complaint  Patient presents with  . Shortness of Breath    HPI Spencer Rice is a 76 y.o. male.  HPI   patient 76 year old male with complex past medical history.  Patient chornic mural thrombus, on Coumadin.  History of DVT.  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  Is here today with shortness of breath.  Patient noted shortness of breath only with lying down.  He sitting up he is doing fine.  When he is walking around he is doing fine.  Is mostly with just lying back.  We talked in depth about different causes of shortness of breath.  Patient at one point leaned towards me and conspiratorially and said "you know what is wrong?  I really indulged on Thanksgiving".  Of note is is 3 days after thanksgiving. Patient says that he ate a bunch of [place] on Thanksgiving.  But then he also ate a bunch of ribs.  And then the gravy from the ribs.  Which really put him over the top he reports.  He thinks that was what is increasing shortness of breath while lying down.  Patient does not have any chest pain.  Shortness of breath is not while at rest or on exertion, only when lying flat.  Of note patient's EF is 10%.  Past Medical History:  Diagnosis Date  . AICD (automatic cardioverter/defibrillator) present   . Cancer of left kidney ([place])    S/P OR 05/2014  . CHF (congestive heart failure) ([place])   . Coronary artery disease   . DVT (deep venous thrombosis) ([facility]) 1983   LLE  . GERD (gastroesophageal reflux disease)   . History of hiatal hernia   . Hypertension     Patient Active Problem List   Diagnosis Date Noted  . Glucose intolerance (impaired glucose tolerance) 06/23/2016  . Vitreous floaters 12/08/2015  . TIA (transient ischemic attack) 12/08/2015  . SOB (shortness of breath) 12/08/2015  . Posterior vitreous detachment of  both eyes 12/08/2015  . Pain due to dental caries 12/08/2015  . Other abnormal glucose 12/08/2015  . Osteoarthrosis 12/08/2015  . Long term (current) use of anticoagulants 12/08/2015  . Kidney mass 12/08/2015  . Hypersomnolence 12/08/2015  . Hyperopia with presbyopia of both eyes 12/08/2015  . Hyperlipidemia 12/08/2015  . History of orthopnea 12/08/2015  . COPD (chronic obstructive pulmonary disease) ([place]) 12/08/2015  . 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 AKI (acute kidney injury) ([place]) 12/08/2015  . Abnormal CT scan, kidney 12/08/2015  . Abdominal bloating 12/08/2015  . Biventricular ICD (implantable cardioverter-defibrillator) in place 12/07/2015  . Chest pain at rest 11/29/2015  . Essential hypertension 11/29/2015  . Acute on chronic systolic CHF (congestive heart failure), NYHA class 1 ([place]) 11/29/2015  . Gastroesophageal reflux disease without esophagitis 11/29/2015  . Pain in the chest   . Renal cancer ([place]) 08/16/2015  . 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 Cancer of kidney ([place]) 01/19/2015  . Hematuria 09/04/2014  . Psychosexual dysfunction with inhibited sexual excitement 07/29/2014  . Pelvic pain in male 07/29/2014  . Increased urinary frequency 07/29/2014  . Heartburn 07/29/2014  . EKG, abnormal 07/29/2014  . Corneal scar 07/29/2014  . 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 Cardiomyopathy ([place]) 07/29/2014  . CAD (coronary artery disease) 07/29/2014  . Disorder of kidney  and ureter 04/09/2014    Past Surgical History:  Procedure Laterality Date  . CARDIAC CATHETERIZATION  1980's  . IMPLANTABLE CARDIOVERTER DEFIBRILLATOR IMPLANT  07/21/2010  . IR GENERIC HISTORICAL  12/23/2014   IR RADIOLOGIST EVAL & MGMT 12/23/2014 [name], MD [facility] INTERV RAD  . IR GENERIC HISTORICAL  07/20/2016   IR RADIOLOGIST EVAL & MGMT 07/20/2016 [facility] INTERV RAD  . IR RADIOLOGIST EVAL & MGMT  07/04/2017  . RENAL CRYOABLATION Left 05/2014       Home  Medications    Prior to Admission medications   Medication Sig Start Date End Date Taking? Authorizing Provider  aspirin 81 MG chewable tablet Chew 81 mg by mouth.    [provider]  benzonatate (TESSALON) 100 MG capsule Take 1 capsule (100 mg total) by mouth every 8 (eight) hours. 08/15/17   Palumbo, April, MD  carvedilol (COREG) 12.5 MG tablet Take 12.5 mg by mouth 2 (two) times daily with a meal.     [provider]  esomeprazole (NEXIUM) 20 MG capsule Take 20 mg by mouth daily as needed (FOR HEARTBURN).    [provider]  Fluticasone-Salmeterol (ADVAIR HFA IN) Inhale into the lungs.    [provider]  fosinopril (MONOPRIL) 10 MG tablet Take 5 mg by mouth daily.    [provider]  furosemide (LASIX) 20 MG tablet Take 1.5 tablets (30 mg total) by mouth every morning. 12/04/15   Hosie Poisson, MD  loratadine (CLARITIN) 10 MG tablet Take 1 tablet (10 mg total) by mouth daily. 08/15/17   Palumbo, April, MD  warfarin (COUMADIN) 5 MG tablet Take 5 mg by mouth daily.    [provider]    Family History Family History  Problem Relation Age of Onset  . Heart disease Father   . Heart attack Father 84  . Hypertension Father   . Hypertension Mother     Social History Social History   Tobacco Use  . Smoking status: Former Smoker    Packs/day: 0.75    Years: 17.00    Pack years: 12.75    Types: Cigarettes    Start date: 05/08/1959    Last attempt to quit: 06/20/1976    Years since quitting: 41.3  . Smokeless tobacco: Never Used  Substance Use Topics  . Alcohol use: No  . Drug use: No     Allergies   Patient has no known allergies.   Review of Systems Review of Systems  Constitutional: Positive for fatigue. Negative for activity change and fever.  HENT: Negative for congestion and facial swelling.   Respiratory: Positive for shortness of breath. Negative for cough and chest tightness.   Cardiovascular: Negative for chest  pain, palpitations and leg swelling.  Gastrointestinal: Negative for abdominal pain.  Neurological: Negative for dizziness, light-headedness and numbness.     Physical Exam Updated Vital Signs BP 117/81   Pulse 69   Temp 98 F (36.7 C)   Resp 20   Ht 6\' 1"  (1.854 m)   Wt 97.5 kg (215 lb)   SpO2 100%   BMI 28.37 kg/m   Physical Exam  Constitutional: He is oriented to person, place, and time. He appears well-nourished.  HENT:  Head: Normocephalic.  Eyes: Conjunctivae and EOM are normal.  Right eye with injected conjunctiva.  Patient reports that this is happened since yesterday.  No pain.  No change in visual field.  Neck: Normal range of motion.  Cardiovascular: Normal rate and regular rhythm.  Pulmonary/Chest:  Effort normal and breath sounds normal. No accessory muscle usage. No respiratory distress.  Neurological: He is oriented to person, place, and time.  Skin: Skin is warm and dry. He is not diaphoretic.  Psychiatric: He has a normal mood and affect. His behavior is normal.     ED Treatments / Results  Labs (all labs ordered are listed, but only abnormal results are displayed) Labs Reviewed  CBC WITH DIFFERENTIAL/PLATELET - Abnormal; Notable for the following components:      Result Value   Platelets 136 (*)    All other components within normal limits  COMPREHENSIVE METABOLIC PANEL - Abnormal; Notable for the following components:   BUN 26 (*)    Creatinine, Ser 1.36 (*)    GFR calc non Af Amer 49 (*)    GFR calc Af Amer 57 (*)    All other components within normal limits  TROPONIN I - Abnormal; Notable for the following components:   Troponin I 0.05 (*)    All other components within normal limits  BRAIN NATRIURETIC PEPTIDE - Abnormal; Notable for the following components:   B Natriuretic Peptide 2,375.1 (*)    All other components within normal limits  PROTIME-INR - Abnormal; Notable for the following components:   Prothrombin Time 31.1 (*)    All other  components within normal limits    EKG  EKG Interpretation  Date/Time:  Tuesday October 30 2017 13:51:15 EST Ventricular Rate:  69 PR Interval:    QRS Duration: 169 QT Interval:  477 QTC Calculation: 512 R Axis:   -28 Text Interpretation:  Sinus rhythm No significant change since last tracing Confirmed by Thomasene Lot, [place] [phone]) on 10/30/2017 1:56:48 PM Also confirmed by Zenovia Jarred [phone])  on 10/30/2017 2:20:21 PM       Radiology Dg Chest 2 View  Result Date: 10/30/2017 CLINICAL DATA:  Shortness of breath and chest pain EXAM: CHEST  2 VIEW COMPARISON:  08/15/2017 FINDINGS: Left chest wall ICD is noted with leads in the right atrial appendage, coronary sinus and right ventricle. Cardiac enlargement is stable. No pleural effusion or edema IMPRESSION: 1. Cardiac enlargement. 2. No heart failure. Electronically Signed   By: Kerby Moors M.D.   On: 10/30/2017 14:42    Procedures Procedures (including critical care time)  Medications Ordered in ED Medications  furosemide (LASIX) injection 30 mg (30 mg Intravenous Given 10/30/17 1413)     Initial Impression / Assessment and Plan / ED Course  I have reviewed the triage vital signs and the nursing notes.  Pertinent labs & imaging results that were available during my care of the patient were reviewed by me and considered in my medical decision making (see chart for details).    patient 76 year old male with complex past medical history.  Patient chornic mural thrombus, on Coumadin.  History of DVT.  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  Is here today with shortness of breath.  Patient noted shortness of breath only with lying down.  He sitting up he is doing fine.  When he is walking around he is doing fine.  Is mostly with just lying back.  We talked in depth about different causes of shortness of breath.  Patient at one point leaned towards me and conspiratorially and said "you know what is wrong?  I really  indulged on Thanksgiving".  Of note is is 3 days after thanksgiving. Patient says that he ate a bunch of [place] on Thanksgiving.  But then he also ate  a bunch of ribs.  And then the gravy from the ribs.  Which really put him over the top he reports.  He thinks that was what is increasing shortness of breath while lying down.  Patient does not have any chest pain.  Shortness of breath is not while at rest or on exertion, only when lying flat.  Of note patient's EF is 10%.  2:20 PM Will presumptively treat for mild fluid overload.  Patient does have clear exam, is 100% on room air.  Is not tachypneic.  And has normal breath sounds.  His shortenss breath is only when lying back, which is consistent with mild fluid overload.  Patient had no other increased risk factors for blood clots.  I have no reason to suspect other etiology except for the one that he detailed above.   3:15 PM Troponin is baseline for patient.  BNP is elevated.  Although no evidence of overt heart failure on x-ray, or with vital signs.  Patient diuresing well with IV Lasix.  Discussed with patient admission versus discharge.  He would like to try care at home.  He feels much better.  He will follow-up with his cardiologist, at [facility].  Final Clinical Impressions(s) / ED Diagnoses   Final diagnoses:  None    ED Discharge Orders    None       Macarthur Critchley, MD 10/30/17 1518

## 2017-10-30 NOTE — ED Notes (Signed)
Pt back from CXR in bed.

## 2017-10-30 NOTE — ED Notes (Signed)
2 urinals placed in room per pt request.

## 2017-12-12 ENCOUNTER — Emergency Department (HOSPITAL_BASED_OUTPATIENT_CLINIC_OR_DEPARTMENT_OTHER)
Admission: EM | Admit: 2017-12-12 | Discharge: 2017-12-12 | Disposition: A | Discharge status: Home / Self Care | Payer: Medicare Other | Attending: Emergency Medicine | Admitting: Emergency Medicine

## 2017-12-12 ENCOUNTER — Other Ambulatory Visit: Payer: Self-pay

## 2017-12-12 ENCOUNTER — Emergency Department (HOSPITAL_BASED_OUTPATIENT_CLINIC_OR_DEPARTMENT_OTHER): Payer: Medicare Other

## 2017-12-12 ENCOUNTER — Encounter (HOSPITAL_BASED_OUTPATIENT_CLINIC_OR_DEPARTMENT_OTHER): Payer: Self-pay | Admitting: *Deleted

## 2017-12-12 DIAGNOSIS — I5022 Chronic systolic (congestive) heart failure: Secondary | ICD-10-CM | POA: Insufficient documentation

## 2017-12-12 DIAGNOSIS — Z87891 Personal history of nicotine dependence: Secondary | ICD-10-CM | POA: Diagnosis not present

## 2017-12-12 DIAGNOSIS — Z7901 Long term (current) use of anticoagulants: Secondary | ICD-10-CM | POA: Insufficient documentation

## 2017-12-12 DIAGNOSIS — J449 Chronic obstructive pulmonary disease, unspecified: Secondary | ICD-10-CM | POA: Diagnosis not present

## 2017-12-12 DIAGNOSIS — Z7982 Long term (current) use of aspirin: Secondary | ICD-10-CM | POA: Insufficient documentation

## 2017-12-12 DIAGNOSIS — I509 Heart failure, unspecified: Secondary | ICD-10-CM | POA: Diagnosis not present

## 2017-12-12 DIAGNOSIS — I110 Hypertensive heart disease with heart failure: Secondary | ICD-10-CM | POA: Diagnosis not present

## 2017-12-12 DIAGNOSIS — Z85528 Personal history of other malignant neoplasm of kidney: Secondary | ICD-10-CM | POA: Diagnosis not present

## 2017-12-12 DIAGNOSIS — R2243 Localized swelling, mass and lump, lower limb, bilateral: Secondary | ICD-10-CM | POA: Diagnosis not present

## 2017-12-12 DIAGNOSIS — Z79899 Other long term (current) drug therapy: Secondary | ICD-10-CM | POA: Insufficient documentation

## 2017-12-12 DIAGNOSIS — I2510 Atherosclerotic heart disease of native coronary artery without angina pectoris: Secondary | ICD-10-CM | POA: Diagnosis not present

## 2017-12-12 DIAGNOSIS — R0602 Shortness of breath: Secondary | ICD-10-CM | POA: Diagnosis present

## 2017-12-12 LAB — COMPREHENSIVE METABOLIC PANEL
ALK PHOS: 56 U/L (ref 38–126)
ALT: 16 U/L — AB (ref 17–63)
AST: 31 U/L (ref 15–41)
Albumin: 3.9 g/dL (ref 3.5–5.0)
Anion gap: 7 (ref 5–15)
BUN: 30 mg/dL — ABNORMAL HIGH (ref 6–20)
CALCIUM: 9 mg/dL (ref 8.9–10.3)
CO2: 25 mmol/L (ref 22–32)
Chloride: 107 mmol/L (ref 101–111)
Creatinine, Ser: 1.36 mg/dL — ABNORMAL HIGH (ref 0.61–1.24)
GFR calc non Af Amer: 49 mL/min — ABNORMAL LOW (ref >60)
GFR, EST AFRICAN AMERICAN: 57 mL/min — AB (ref >60)
GLUCOSE: 110 mg/dL — AB (ref 65–99)
Potassium: 4.7 mmol/L (ref 3.5–5.1)
SODIUM: 139 mmol/L (ref 135–145)
Total Bilirubin: 0.8 mg/dL (ref 0.3–1.2)
Total Protein: 7 g/dL (ref 6.5–8.1)

## 2017-12-12 LAB — CBC WITH DIFFERENTIAL/PLATELET
Basophils Absolute: 0 10*3/uL (ref 0.0–0.1)
Basophils Relative: 0 %
EOS ABS: 0.1 10*3/uL (ref 0.0–0.7)
Eosinophils Relative: 1 %
HCT: 41.1 % (ref 39.0–52.0)
HEMOGLOBIN: 14.3 g/dL (ref 13.0–17.0)
LYMPHS ABS: 2 10*3/uL (ref 0.7–4.0)
LYMPHS PCT: 45 %
MCH: 27.2 pg (ref 26.0–34.0)
MCHC: 34.8 g/dL (ref 30.0–36.0)
MCV: 78.1 fL (ref 78.0–100.0)
Monocytes Absolute: 0.6 10*3/uL (ref 0.1–1.0)
Monocytes Relative: 13 %
NEUTROS ABS: 1.9 10*3/uL (ref 1.7–7.7)
Neutrophils Relative %: 41 %
Platelets: 178 10*3/uL (ref 150–400)
RBC: 5.26 MIL/uL (ref 4.22–5.81)
RDW: 14.5 % (ref 11.5–15.5)
WBC: 4.6 10*3/uL (ref 4.0–10.5)

## 2017-12-12 LAB — PROTIME-INR
INR: 2.99
Prothrombin Time: 30.8 seconds — ABNORMAL HIGH (ref 11.4–15.2)

## 2017-12-12 LAB — BRAIN NATRIURETIC PEPTIDE: B Natriuretic Peptide: 1942.2 pg/mL — ABNORMAL HIGH (ref 0.0–100.0)

## 2017-12-12 LAB — TROPONIN I: Troponin I: 0.05 ng/mL (ref <0.03)

## 2017-12-12 MED ORDER — FUROSEMIDE 10 MG/ML IJ SOLN
20.0000 mg | Freq: Once | INTRAMUSCULAR | Status: AC
  Administered 2017-12-12: 20 mg via INTRAVENOUS
  Filled 2017-12-12: qty 2

## 2017-12-12 NOTE — ED Triage Notes (Signed)
Pt c/o SOB x 2 days

## 2017-12-12 NOTE — ED Notes (Signed)
C/o sob x 2 days  Denies pain  No distress noted

## 2017-12-12 NOTE — ED Notes (Signed)
Nurse first-O2 sat 100% HR 73 RR 20-NAD

## 2017-12-12 NOTE — ED Provider Notes (Signed)
[facility] EMERGENCY DEPARTMENT Provider Note   CSN: [id number] Arrival date & time: 12/12/17  1744     History   Chief Complaint Chief Complaint  Patient presents with  . Shortness of Breath    HPI Spencer Rice is a 77 y.o. male.  HPI  77 year old male with a history of congestive heart failure, DVT, and AICD presents with shortness of breath.  He states is been noticeable more prominently since yesterday.  Last night he had trouble sleeping and had to use an extra pillow.  This is typically how he feels when he has extra fluid.  He states that since Christmas he has been eating more than he should and drinking more fluid than he supposed to.  This feels very similar to multiple prior episodes of too much fluid.  He denies any chest pain or cough.  He has noticed some swelling in his ankles as well as on his abdomen.  He has been compliant with his Lasix, currently takes 30 mg/day.  Past Medical History:  Diagnosis Date  . AICD (automatic cardioverter/defibrillator) present   . Cancer of left kidney ([place])    S/P OR 05/2014  . CHF (congestive heart failure) ([place])   . Coronary artery disease   . DVT (deep venous thrombosis) ([facility]) 1983   LLE  . GERD (gastroesophageal reflux disease)   . History of hiatal hernia   . Hypertension     Patient Active Problem List   Diagnosis Date Noted  . Glucose intolerance (impaired glucose tolerance) 06/23/2016  . Vitreous floaters 12/08/2015  . TIA (transient ischemic attack) 12/08/2015  . SOB (shortness of breath) 12/08/2015  . Posterior vitreous detachment of both eyes 12/08/2015  . Pain due to dental caries 12/08/2015  . Other abnormal glucose 12/08/2015  . Osteoarthrosis 12/08/2015  . Long term (current) use of anticoagulants 12/08/2015  . Kidney mass 12/08/2015  . Hypersomnolence 12/08/2015  . Hyperopia with presbyopia of both eyes 12/08/2015  . Hyperlipidemia 12/08/2015  . History of orthopnea 12/08/2015  . COPD  (chronic obstructive pulmonary disease) ([place]) 12/08/2015  . 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 AKI (acute kidney injury) ([place]) 12/08/2015  . Abnormal CT scan, kidney 12/08/2015  . Abdominal bloating 12/08/2015  . Biventricular ICD (implantable cardioverter-defibrillator) in place 12/07/2015  . Chest pain at rest 11/29/2015  . Essential hypertension 11/29/2015  . Acute on chronic systolic CHF (congestive heart failure), NYHA class 1 ([place]) 11/29/2015  . Gastroesophageal reflux disease without esophagitis 11/29/2015  . Pain in the chest   . Renal cancer ([place]) 08/16/2015  . 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 Cancer of kidney ([place]) 01/19/2015  . Hematuria 09/04/2014  . Psychosexual dysfunction with inhibited sexual excitement 07/29/2014  . Pelvic pain in male 07/29/2014  . Increased urinary frequency 07/29/2014  . Heartburn 07/29/2014  . EKG, abnormal 07/29/2014  . Corneal scar 07/29/2014  . 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 Cardiomyopathy ([place]) 07/29/2014  . CAD (coronary artery disease) 07/29/2014  . Disorder of kidney and ureter 04/09/2014    Past Surgical History:  Procedure Laterality Date  . CARDIAC CATHETERIZATION  1980's  . IMPLANTABLE CARDIOVERTER DEFIBRILLATOR IMPLANT  07/21/2010  . IR GENERIC HISTORICAL  12/23/2014   IR RADIOLOGIST EVAL & MGMT 12/23/2014 [name], MD [facility] INTERV RAD  . IR GENERIC HISTORICAL  07/20/2016   IR RADIOLOGIST EVAL & MGMT 07/20/2016 [facility] INTERV RAD  . IR RADIOLOGIST EVAL & MGMT  07/04/2017  .  RENAL CRYOABLATION Left 05/2014       Home Medications    Prior to Admission medications   Medication Sig Start Date End Date Taking? Authorizing Provider  aspirin 81 MG chewable tablet Chew 81 mg by mouth.    [provider]  benzonatate (TESSALON) 100 MG capsule Take 1 capsule (100 mg total) by mouth every 8 (eight) hours. 08/15/17   Palumbo, April, MD  carvedilol (COREG) 12.5 MG tablet Take 12.5 mg by  mouth 2 (two) times daily with a meal.     [provider]  esomeprazole (NEXIUM) 20 MG capsule Take 20 mg by mouth daily as needed (FOR HEARTBURN).    [provider]  Fluticasone-Salmeterol (ADVAIR HFA IN) Inhale into the lungs.    [provider]  fosinopril (MONOPRIL) 10 MG tablet Take 5 mg by mouth daily.    [provider]  furosemide (LASIX) 20 MG tablet Take 1.5 tablets (30 mg total) by mouth every morning. 12/04/15   Hosie Poisson, MD  loratadine (CLARITIN) 10 MG tablet Take 1 tablet (10 mg total) by mouth daily. 08/15/17   Palumbo, April, MD  warfarin (COUMADIN) 5 MG tablet Take 5 mg by mouth daily.    [provider]    Family History Family History  Problem Relation Age of Onset  . Heart disease Father   . Heart attack Father 46  . Hypertension Father   . Hypertension Mother     Social History Social History   Tobacco Use  . Smoking status: Former Smoker    Packs/day: 0.75    Years: 17.00    Pack years: 12.75    Types: Cigarettes    Start date: 05/08/1959    Last attempt to quit: 06/20/1976    Years since quitting: 41.5  . Smokeless tobacco: Never Used  Substance Use Topics  . Alcohol use: No  . Drug use: No     Allergies   Patient has no known allergies.   Review of Systems Review of Systems  Constitutional: Negative for fever.  Respiratory: Positive for shortness of breath. Negative for cough.   Cardiovascular: Positive for leg swelling. Negative for chest pain.  Gastrointestinal: Positive for abdominal distention. Negative for abdominal pain.  All other systems reviewed and are negative.    Physical Exam Updated Vital Signs BP 112/80 (BP Location: Left Arm)   Pulse 71   Temp 98.1 F (36.7 C)   Resp 18   Ht 6\' 1"  (1.854 m)   Wt 98.9 kg (218 lb)   SpO2 98%   BMI 28.76 kg/m   Physical Exam  Constitutional: He is oriented to person, place, and time. He appears well-developed and well-nourished.   Non-toxic appearance. He does not appear ill. No distress.  HENT:  Head: Normocephalic and atraumatic.  Right Ear: External ear normal.  Left Ear: External ear normal.  Nose: Nose normal.  Eyes: Right eye exhibits no discharge. Left eye exhibits no discharge.  Neck: Neck supple. No JVD present.  Cardiovascular: Normal rate, regular rhythm and normal heart sounds.  Pulmonary/Chest: Effort normal and breath sounds normal. He has no decreased breath sounds. He has no wheezes. He has no rales.  Abdominal: Soft. There is no tenderness.  Musculoskeletal:       Right lower leg: He exhibits edema (mild, ankles).       Left lower leg: He exhibits edema (mild, ankles).  Neurological: He is alert and oriented to person, place, and time.  Skin: Skin is  warm and dry.  Nursing note and vitals reviewed.    ED Treatments / Results  Labs (all labs ordered are listed, but only abnormal results are displayed) Labs Reviewed  COMPREHENSIVE METABOLIC PANEL - Abnormal; Notable for the following components:      Result Value   Glucose, Bld 110 (*)    BUN 30 (*)    Creatinine, Ser 1.36 (*)    ALT 16 (*)    GFR calc non Af Amer 49 (*)    GFR calc Af Amer 57 (*)    All other components within normal limits  TROPONIN I - Abnormal; Notable for the following components:   Troponin I 0.05 (*)    All other components within normal limits  BRAIN NATRIURETIC PEPTIDE - Abnormal; Notable for the following components:   B Natriuretic Peptide 1,942.2 (*)    All other components within normal limits  PROTIME-INR - Abnormal; Notable for the following components:   Prothrombin Time 30.8 (*)    All other components within normal limits  CBC WITH DIFFERENTIAL/PLATELET    EKG  EKG Interpretation  Date/Time:  Wednesday December 12 2017 17:47:29 EST Ventricular Rate:  69 PR Interval:  208 QRS Duration: 140 QT Interval:  442 QTC Calculation: 473 R Axis:   -37 Text Interpretation:  Atrial-sensed  ventricular-paced rhythm Abnormal ECG no significant change since Nov 2018 Confirmed by Sherwood Gambler [phone]) on 12/12/2017 9:47:12 PM       Radiology Dg Chest 2 View  Result Date: 12/12/2017 CLINICAL DATA:  Shortness of breath for 2 days EXAM: CHEST  2 VIEW COMPARISON:  10/30/2017 FINDINGS: Stable prominent cardiomegaly. Biventricular pacer/ICD from the left. Stable mediastinal contours. The lungs are clear. No effusion or pneumothorax. Spondylosis. IMPRESSION: Chronic cardiomegaly without failure. Electronically Signed   By: Monte Fantasia M.D.   On: 12/12/2017 18:09    Procedures Procedures (including critical care time)  Medications Ordered in ED Medications  furosemide (LASIX) injection 20 mg (not administered)     Initial Impression / Assessment and Plan / ED Course  I have reviewed the triage vital signs and the nursing notes.  Pertinent labs & imaging results that were available during my care of the patient were reviewed by me and considered in my medical decision making (see chart for details).     Patient likely is having a recurrent and mild CHF exacerbation.  I do not hear significant abnormalities on his lung exam and there is no significant pulmonary edema on chest x-ray.  He has not hypoxic.  He is in no distress and is actually laying at 45 degrees or more flat.  He feels much better after a dose of 20 mg IV Lasix.  I will bump up his Lasix from 30 mg up to 40 mg/day for the next 5 days and have him follow-up with his PCP.  Discussed that the likely causes his nonadherence to his diet and fluid restriction.  Discussed the importance of this.  He has a mild low level elevated troponin but he has no chest pain I think this is from CHF and it is at a similar level to multiple priors.  Discharge home with return precautions.  Final Clinical Impressions(s) / ED Diagnoses   Final diagnoses:  Acute on chronic congestive heart failure, unspecified heart failure type [facility])     ED Discharge Orders    None       Sherwood Gambler, MD 12/12/17 2339

## 2017-12-12 NOTE — Discharge Instructions (Signed)
Increase your furosemide (Lasix) to 40 mg or 2 tablets every morning.  Do this for the next 5 days.  If you develop worsening shortness of breath or you develop chest pain or other new or concerning symptoms, return to the ER immediately.

## 2017-12-12 NOTE — ED Notes (Signed)
ED Provider at bedside. 

## 2017-12-12 NOTE — ED Notes (Signed)
Date and time results received: 12/12/17 2133   Test: troponin Critical Value: 0.05  Name of Provider Notified: Dr. Regenia Skeeter  Orders Received? Or Actions Taken?: MD going to evaluate pt now

## 2018-01-18 ENCOUNTER — Emergency Department (HOSPITAL_BASED_OUTPATIENT_CLINIC_OR_DEPARTMENT_OTHER)
Admission: EM | Admit: 2018-01-18 | Discharge: 2018-01-19 | Disposition: A | Discharge status: Home / Self Care | Payer: Medicare Other | Attending: Emergency Medicine | Admitting: Emergency Medicine

## 2018-01-18 ENCOUNTER — Emergency Department (HOSPITAL_BASED_OUTPATIENT_CLINIC_OR_DEPARTMENT_OTHER): Payer: Medicare Other

## 2018-01-18 ENCOUNTER — Encounter (HOSPITAL_BASED_OUTPATIENT_CLINIC_OR_DEPARTMENT_OTHER): Payer: Self-pay | Admitting: *Deleted

## 2018-01-18 ENCOUNTER — Other Ambulatory Visit: Payer: Self-pay

## 2018-01-18 DIAGNOSIS — Z7901 Long term (current) use of anticoagulants: Secondary | ICD-10-CM | POA: Insufficient documentation

## 2018-01-18 DIAGNOSIS — Z7982 Long term (current) use of aspirin: Secondary | ICD-10-CM | POA: Insufficient documentation

## 2018-01-18 DIAGNOSIS — I5023 Acute on chronic systolic (congestive) heart failure: Secondary | ICD-10-CM | POA: Diagnosis not present

## 2018-01-18 DIAGNOSIS — J449 Chronic obstructive pulmonary disease, unspecified: Secondary | ICD-10-CM | POA: Diagnosis not present

## 2018-01-18 DIAGNOSIS — Z85528 Personal history of other malignant neoplasm of kidney: Secondary | ICD-10-CM | POA: Diagnosis not present

## 2018-01-18 DIAGNOSIS — I2510 Atherosclerotic heart disease of native coronary artery without angina pectoris: Secondary | ICD-10-CM | POA: Diagnosis not present

## 2018-01-18 DIAGNOSIS — I10 Essential (primary) hypertension: Secondary | ICD-10-CM | POA: Diagnosis not present

## 2018-01-18 DIAGNOSIS — Z87891 Personal history of nicotine dependence: Secondary | ICD-10-CM | POA: Diagnosis not present

## 2018-01-18 DIAGNOSIS — R0602 Shortness of breath: Secondary | ICD-10-CM | POA: Diagnosis present

## 2018-01-18 DIAGNOSIS — Z8673 Personal history of transient ischemic attack (TIA), and cerebral infarction without residual deficits: Secondary | ICD-10-CM | POA: Diagnosis not present

## 2018-01-18 DIAGNOSIS — I509 Heart failure, unspecified: Secondary | ICD-10-CM

## 2018-01-18 LAB — BASIC METABOLIC PANEL
Anion gap: 11 (ref 5–15)
BUN: 28 mg/dL — AB (ref 6–20)
CO2: 21 mmol/L — ABNORMAL LOW (ref 22–32)
Calcium: 8.7 mg/dL — ABNORMAL LOW (ref 8.9–10.3)
Chloride: 106 mmol/L (ref 101–111)
Creatinine, Ser: 1.62 mg/dL — ABNORMAL HIGH (ref 0.61–1.24)
GFR calc Af Amer: 46 mL/min — ABNORMAL LOW (ref >60)
GFR calc non Af Amer: 40 mL/min — ABNORMAL LOW (ref >60)
Glucose, Bld: 127 mg/dL — ABNORMAL HIGH (ref 65–99)
Potassium: 4.4 mmol/L (ref 3.5–5.1)
SODIUM: 138 mmol/L (ref 135–145)

## 2018-01-18 LAB — PROTIME-INR
INR: 2.8
PROTHROMBIN TIME: 29.3 s — AB (ref 11.4–15.2)

## 2018-01-18 LAB — CBC
HCT: 38.6 % — ABNORMAL LOW (ref 39.0–52.0)
Hemoglobin: 13.4 g/dL (ref 13.0–17.0)
MCH: 26.8 pg (ref 26.0–34.0)
MCHC: 34.7 g/dL (ref 30.0–36.0)
MCV: 77.2 fL — ABNORMAL LOW (ref 78.0–100.0)
Platelets: 146 10*3/uL — ABNORMAL LOW (ref 150–400)
RBC: 5 MIL/uL (ref 4.22–5.81)
RDW: 15 % (ref 11.5–15.5)
WBC: 4.9 10*3/uL (ref 4.0–10.5)

## 2018-01-18 LAB — BRAIN NATRIURETIC PEPTIDE: B Natriuretic Peptide: 2549.9 pg/mL — ABNORMAL HIGH (ref 0.0–100.0)

## 2018-01-18 LAB — TROPONIN I: TROPONIN I: 0.05 ng/mL — AB (ref <0.03)

## 2018-01-18 MED ORDER — FUROSEMIDE 10 MG/ML IJ SOLN
20.0000 mg | Freq: Once | INTRAMUSCULAR | Status: AC
  Administered 2018-01-18: 20 mg via INTRAVENOUS
  Filled 2018-01-18: qty 2

## 2018-01-18 NOTE — ED Notes (Signed)
Pt ambulated in NAD. SPO2 as low as 96%.

## 2018-01-18 NOTE — ED Provider Notes (Signed)
[facility] EMERGENCY DEPARTMENT Provider Note   CSN: [id number] Arrival date & time: 01/18/18  7846     History   Chief Complaint Chief Complaint  Patient presents with  . Shortness of Breath    HPI Spencer Rice is a 77 y.o. male with a past medical history of CHF, DVT on Coumadin, AICD, CAD, GERD, who presents to ED for evaluation of increasing shortness of breath for the past 3 days.  States this feels similar to his prior CHF exacerbations and feeling like "too much fluid in my lungs," including last time he was seen here in this ED approximately 5 weeks ago.  He also noticed bilateral leg swelling since yesterday.  He states that he has been compliant with his home Lasix.  He currently takes 30 mg however, this morning he did take 40 mg.  He did not follow-up with his PCP after his initial ED visit 5 weeks ago but does have an appointment scheduled in 4 days.  He denies any chest pain, hemoptysis, URI symptoms, fever, abdominal pain, vomiting.  HPI  Past Medical History:  Diagnosis Date  . AICD (automatic cardioverter/defibrillator) present   . Cancer of left kidney ([place])    S/P OR 05/2014  . CHF (congestive heart failure) ([place])   . Coronary artery disease   . DVT (deep venous thrombosis) (HCC) 1983   LLE  . GERD (gastroesophageal reflux disease)   . History of hiatal hernia   . Hypertension     Patient Active Problem List   Diagnosis Date Noted  . Glucose intolerance (impaired glucose tolerance) 06/23/2016  . Vitreous floaters 12/08/2015  . TIA (transient ischemic attack) 12/08/2015  . SOB (shortness of breath) 12/08/2015  . Posterior vitreous detachment of both eyes 12/08/2015  . Pain due to dental caries 12/08/2015  . Other abnormal glucose 12/08/2015  . Osteoarthrosis 12/08/2015  . Long term (current) use of anticoagulants 12/08/2015  . Kidney mass 12/08/2015  . Hypersomnolence 12/08/2015  . Hyperopia with presbyopia of both eyes 12/08/2015  .  Hyperlipidemia 12/08/2015  . History of orthopnea 12/08/2015  . COPD (chronic obstructive pulmonary disease) ([place]) 12/08/2015  . 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 AKI (acute kidney injury) ([place]) 12/08/2015  . Abnormal CT scan, kidney 12/08/2015  . Abdominal bloating 12/08/2015  . Biventricular ICD (implantable cardioverter-defibrillator) in place 12/07/2015  . Chest pain at rest 11/29/2015  . Essential hypertension 11/29/2015  . Acute on chronic systolic CHF (congestive heart failure), NYHA class 1 ([place]) 11/29/2015  . Gastroesophageal reflux disease without esophagitis 11/29/2015  . Pain in the chest   . Renal cancer ([place]) 08/16/2015  . 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 Cancer of kidney ([place]) 01/19/2015  . Hematuria 09/04/2014  . Psychosexual dysfunction with inhibited sexual excitement 07/29/2014  . Pelvic pain in male 07/29/2014  . Increased urinary frequency 07/29/2014  . Heartburn 07/29/2014  . EKG, abnormal 07/29/2014  . Corneal scar 07/29/2014  . 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 Cardiomyopathy ([place]) 07/29/2014  . CAD (coronary artery disease) 07/29/2014  . Disorder of kidney and ureter 04/09/2014    Past Surgical History:  Procedure Laterality Date  . CARDIAC CATHETERIZATION  1980's  . IMPLANTABLE CARDIOVERTER DEFIBRILLATOR IMPLANT  07/21/2010  . IR GENERIC HISTORICAL  12/23/2014   IR RADIOLOGIST EVAL & MGMT 12/23/2014 [name], MD [facility] INTERV RAD  . IR GENERIC HISTORICAL  07/20/2016   IR RADIOLOGIST EVAL & MGMT 07/20/2016 [facility] INTERV  RAD  . IR RADIOLOGIST EVAL & MGMT  07/04/2017  . RENAL CRYOABLATION Left 05/2014       Home Medications    Prior to Admission medications   Medication Sig Start Date End Date Taking? Authorizing Provider  aspirin 81 MG chewable tablet Chew 81 mg by mouth.    [provider]  benzonatate (TESSALON) 100 MG capsule Take 1 capsule (100 mg total) by mouth every 8 (eight) hours. 08/15/17    Palumbo, April, MD  carvedilol (COREG) 12.5 MG tablet Take 12.5 mg by mouth 2 (two) times daily with a meal.     [provider]  esomeprazole (NEXIUM) 20 MG capsule Take 20 mg by mouth daily as needed (FOR HEARTBURN).    [provider]  Fluticasone-Salmeterol (ADVAIR HFA IN) Inhale into the lungs.    [provider]  fosinopril (MONOPRIL) 10 MG tablet Take 5 mg by mouth daily.    [provider]  furosemide (LASIX) 20 MG tablet Take 1.5 tablets (30 mg total) by mouth every morning. 12/04/15   Hosie Poisson, MD  loratadine (CLARITIN) 10 MG tablet Take 1 tablet (10 mg total) by mouth daily. 08/15/17   Palumbo, April, MD  warfarin (COUMADIN) 5 MG tablet Take 5 mg by mouth daily.    [provider]    Family History Family History  Problem Relation Age of Onset  . Heart disease Father   . Heart attack Father 27  . Hypertension Father   . Hypertension Mother     Social History Social History   Tobacco Use  . Smoking status: Former Smoker    Packs/day: 0.75    Years: 17.00    Pack years: 12.75    Types: Cigarettes    Start date: 05/08/1959    Last attempt to quit: 06/20/1976    Years since quitting: 41.6  . Smokeless tobacco: Never Used  Substance Use Topics  . Alcohol use: No  . Drug use: No     Allergies   Patient has no known allergies.   Review of Systems Review of Systems  Constitutional: Negative for appetite change, chills and fever.  HENT: Negative for ear pain, rhinorrhea, sneezing and sore throat.   Eyes: Negative for photophobia and visual disturbance.  Respiratory: Positive for shortness of breath. Negative for cough, chest tightness and wheezing.   Cardiovascular: Positive for leg swelling. Negative for chest pain and palpitations.  Gastrointestinal: Negative for abdominal pain, blood in stool, constipation, diarrhea, nausea and vomiting.  Genitourinary: Negative for dysuria, hematuria and urgency.  Musculoskeletal:  Negative for myalgias.  Skin: Negative for rash.  Neurological: Negative for dizziness, weakness and light-headedness.     Physical Exam Updated Vital Signs BP 117/81   Pulse 87   Temp 97.9 F (36.6 C) (Oral)   Resp (!) 27   Ht 6\' 1"  (1.854 m)   Wt 97.5 kg (215 lb)   SpO2 99%   BMI 28.37 kg/m   Physical Exam  Constitutional: He appears well-developed and well-nourished. No distress.  Nontoxic appearing and in no acute distress.  Speaking complete sentences without difficulty.  HENT:  Head: Normocephalic and atraumatic.  Nose: Nose normal.  Eyes: Conjunctivae and EOM are normal. Right eye exhibits no discharge. Left eye exhibits no discharge. No scleral icterus.  Neck: Normal range of motion. Neck supple.  Cardiovascular: Normal rate, regular rhythm, normal heart sounds and intact distal pulses. Exam reveals no gallop and no friction rub.  No murmur heard. Pulmonary/Chest: Effort  normal and breath sounds normal. No respiratory distress.  Abdominal: Soft. Bowel sounds are normal. He exhibits no distension. There is no tenderness. There is no guarding.  Musculoskeletal: Normal range of motion. He exhibits no edema.  1+, bilateral pitting edema in lower extremities.  No calf tenderness, erythema noted.  Neurological: He is alert. He exhibits normal muscle tone. Coordination normal.  Skin: Skin is warm and dry. No rash noted.  Psychiatric: He has a normal mood and affect.  Nursing note and vitals reviewed.    ED Treatments / Results  Labs (all labs ordered are listed, but only abnormal results are displayed) Labs Reviewed  BASIC METABOLIC PANEL - Abnormal; Notable for the following components:      Result Value   CO2 21 (*)    Glucose, Bld 127 (*)    BUN 28 (*)    Creatinine, Ser 1.62 (*)    Calcium 8.7 (*)    GFR calc non Af Amer 40 (*)    GFR calc Af Amer 46 (*)    All other components within normal limits  CBC - Abnormal; Notable for the following components:    HCT 38.6 (*)    MCV 77.2 (*)    Platelets 146 (*)    All other components within normal limits  TROPONIN I - Abnormal; Notable for the following components:   Troponin I 0.05 (*)    All other components within normal limits  BRAIN NATRIURETIC PEPTIDE - Abnormal; Notable for the following components:   B Natriuretic Peptide 2,549.9 (*)    All other components within normal limits  PROTIME-INR - Abnormal; Notable for the following components:   Prothrombin Time 29.3 (*)    All other components within normal limits    EKG  EKG Interpretation  Date/Time:  Friday January 18 2018 19:27:35 EST Ventricular Rate:  73 PR Interval:  198 QRS Duration: 142 QT Interval:  444 QTC Calculation: 489 R Axis:   -61 Text Interpretation:  Atrial-sensed ventricular-paced rhythm with occasional Premature ventricular complexes Abnormal ECG No acute changes Confirmed by Varney Biles ([id number]) on 01/18/2018 8:29:35 PM       Radiology Dg Chest 2 View  Result Date: 01/18/2018 CLINICAL DATA:  Shortness of breath.  Former smoker. EXAM: CHEST  2 VIEW COMPARISON:  12/12/2017 FINDINGS: Prominent enlargement of the cardiac silhouette is unchanged. An ICD remains in place. Aortic atherosclerosis is noted. No airspace consolidation, edema, pleural effusion, or pneumothorax is identified. No acute osseous abnormality is seen. IMPRESSION: Unchanged cardiomegaly without evidence of acute cardiopulmonary process. Electronically Signed   By: Logan Bores M.D.   On: 01/18/2018 20:24    Procedures Procedures (including critical care time)  Medications Ordered in ED Medications  furosemide (LASIX) injection 20 mg (20 mg Intravenous Given 01/18/18 2102)     Initial Impression / Assessment and Plan / ED Course  I have reviewed the triage vital signs and the nursing notes.  Pertinent labs & imaging results that were available during my care of the patient were reviewed by me and considered in my medical decision  making (see chart for details).     Patient presents to ED for evaluation of past 3 days.  States this feels similar to his prior CHF exacerbations similarly to what he experienced last time he was here approximately 5 weeks ago.  He did not follow-up with his PCP at that time but is scheduled for a follow-up appointment on February 19.  He has been taking 40  mg of his Lasix daily for the past 2 days as he was told to do so last time he was here.  He denies any chest pain, hemoptysis, fever.  He is not hypoxic.  He does have bilateral lower extremity edema which appears per his baseline.  BNP elevated similarly to what it was last time.  He does have a mild troponin elevation as well at 0.05, but denies any chest pain and is similar to multiple prior readings.  EKG with no acute changes.  CBC, BMP also unremarkable and similar to prior readings.  Patient given IV Lasix 20 mg with complete resolution of his symptoms.  He was able to ambulate with pulse ox well above 96% on room air.  He does not feel that he needs to be admitted for his shortness of breath.  I did advise him to continue his 40 mg of Lasix until evaluated by his PCP in 4 days. Patient appears stable for discharge at this time. Strict return precautions given. Patient discussed with Dr. Kathrynn Humble.  Portions of this note were generated with [employer]. Dictation errors may occur despite best attempts at proofreading..  Final Clinical Impressions(s) / ED Diagnoses   Final diagnoses:  Congestive heart failure, unspecified HF chronicity, unspecified heart failure type [facility])    ED Discharge Orders    None       Delia Heady, PA-C 01/18/18 [place], Shaylen Nephew, MD 01/19/18 0105

## 2018-01-18 NOTE — ED Triage Notes (Signed)
SOB, nausea, dizziness x 2-3 days. Ambulatory. BS clear bilaterally. Denies pain. Reports slight cough.

## 2018-01-18 NOTE — Discharge Instructions (Signed)
Take 40 mg of your Lasix for the next 4 days.  Be sure to follow-up with your primary care provider as scheduled on February 19. Continue your other home medications as previously prescribed. Return to ED for worsening symptoms, worsening shortness of breath, chest pain, coughing up blood, leg swelling.

## 2018-01-19 NOTE — ED Notes (Signed)
Pt verbalizes understanding of d/c instructions and denies any further needs at this time. 

## 2018-02-02 ENCOUNTER — Other Ambulatory Visit: Payer: Self-pay

## 2018-02-02 ENCOUNTER — Emergency Department (HOSPITAL_BASED_OUTPATIENT_CLINIC_OR_DEPARTMENT_OTHER)
Admission: EM | Admit: 2018-02-02 | Discharge: 2018-02-02 | Disposition: A | Discharge status: Home / Self Care | Payer: Medicare Other | Attending: Emergency Medicine | Admitting: Emergency Medicine

## 2018-02-02 ENCOUNTER — Encounter (HOSPITAL_BASED_OUTPATIENT_CLINIC_OR_DEPARTMENT_OTHER): Payer: Self-pay | Admitting: Emergency Medicine

## 2018-02-02 ENCOUNTER — Emergency Department (HOSPITAL_BASED_OUTPATIENT_CLINIC_OR_DEPARTMENT_OTHER): Payer: Medicare Other

## 2018-02-02 DIAGNOSIS — R079 Chest pain, unspecified: Secondary | ICD-10-CM | POA: Insufficient documentation

## 2018-02-02 DIAGNOSIS — Z87891 Personal history of nicotine dependence: Secondary | ICD-10-CM | POA: Insufficient documentation

## 2018-02-02 DIAGNOSIS — R0602 Shortness of breath: Secondary | ICD-10-CM | POA: Insufficient documentation

## 2018-02-02 DIAGNOSIS — I509 Heart failure, unspecified: Secondary | ICD-10-CM | POA: Diagnosis not present

## 2018-02-02 DIAGNOSIS — I50813 Acute on chronic right heart failure: Secondary | ICD-10-CM

## 2018-02-02 LAB — CBC WITH DIFFERENTIAL/PLATELET
Basophils Absolute: 0 10*3/uL (ref 0.0–0.1)
Basophils Relative: 0 %
Eosinophils Absolute: 0.1 10*3/uL (ref 0.0–0.7)
Eosinophils Relative: 2 %
HCT: 35.7 % — ABNORMAL LOW (ref 39.0–52.0)
Hemoglobin: 12.3 g/dL — ABNORMAL LOW (ref 13.0–17.0)
Lymphocytes Relative: 32 %
Lymphs Abs: 1.3 10*3/uL (ref 0.7–4.0)
MCH: 26.3 pg (ref 26.0–34.0)
MCHC: 34.5 g/dL (ref 30.0–36.0)
MCV: 76.3 fL — ABNORMAL LOW (ref 78.0–100.0)
Monocytes Absolute: 0.6 10*3/uL (ref 0.1–1.0)
Monocytes Relative: 14 %
Neutro Abs: 2.1 10*3/uL (ref 1.7–7.7)
Neutrophils Relative %: 52 %
Platelets: 151 10*3/uL (ref 150–400)
RBC: 4.68 MIL/uL (ref 4.22–5.81)
RDW: 14.9 % (ref 11.5–15.5)
WBC: 4 10*3/uL (ref 4.0–10.5)

## 2018-02-02 LAB — BASIC METABOLIC PANEL
Anion gap: 8 (ref 5–15)
BUN: 30 mg/dL — ABNORMAL HIGH (ref 6–20)
CO2: 24 mmol/L (ref 22–32)
Calcium: 8.4 mg/dL — ABNORMAL LOW (ref 8.9–10.3)
Chloride: 108 mmol/L (ref 101–111)
Creatinine, Ser: 1.65 mg/dL — ABNORMAL HIGH (ref 0.61–1.24)
GFR calc Af Amer: 45 mL/min — ABNORMAL LOW (ref >60)
GFR calc non Af Amer: 39 mL/min — ABNORMAL LOW (ref >60)
Glucose, Bld: 134 mg/dL — ABNORMAL HIGH (ref 65–99)
Potassium: 4.1 mmol/L (ref 3.5–5.1)
Sodium: 140 mmol/L (ref 135–145)

## 2018-02-02 LAB — BRAIN NATRIURETIC PEPTIDE: B Natriuretic Peptide: 2157.5 pg/mL — ABNORMAL HIGH (ref 0.0–100.0)

## 2018-02-02 LAB — TROPONIN I: Troponin I: 0.04 ng/mL (ref <0.03)

## 2018-02-02 MED ORDER — FUROSEMIDE 10 MG/ML IJ SOLN
40.0000 mg | Freq: Once | INTRAMUSCULAR | Status: DC
  Filled 2018-02-02: qty 4

## 2018-02-02 MED ORDER — FUROSEMIDE 10 MG/ML IJ SOLN
20.0000 mg | Freq: Once | INTRAMUSCULAR | Status: AC
  Administered 2018-02-02: 20 mg via INTRAVENOUS

## 2018-02-02 NOTE — ED Notes (Signed)
ALanny Cramp PA aware of elevated troponin.

## 2018-02-02 NOTE — ED Notes (Signed)
Pt given d/c instructions as per chart. Verbalizes understanding. No questions. 

## 2018-02-02 NOTE — ED Provider Notes (Signed)
[facility] EMERGENCY DEPARTMENT Provider Note   CSN: [id number] Arrival date & time: 02/02/18  1816     History   Chief Complaint Chief Complaint  Patient presents with  . Chest Pain  . Shortness of Breath    HPI Spencer Rice is a 77 y.o. male with history of CHF and AICD, hypertension, CAD who presents with a one-week history of chest pain and shortness of breath on exertion.  He describes his chest pain as a heaviness and pressure.  It is nonradiating.  He has no chest pain at rest at this time.  He also has had lightheadedness and dizziness with leaning over for the past week.  He has also noticed increasing lower extremity edema, which does improve with putting his feet up.  He denies any abdominal pain, nausea, vomiting, urinary symptoms, cough.  He does endorse two-pillow orthopnea.  Most recent echocardiogram showed EF 10%.  He is currently taking 40 mg Lasix daily.  HPI  Past Medical History:  Diagnosis Date  . AICD (automatic cardioverter/defibrillator) present   . Cancer of left kidney ([place])    S/P OR 05/2014  . CHF (congestive heart failure) ([place])   . Coronary artery disease   . DVT (deep venous thrombosis) ([facility]) 1983   LLE  . GERD (gastroesophageal reflux disease)   . History of hiatal hernia   . Hypertension     Patient Active Problem List   Diagnosis Date Noted  . Glucose intolerance (impaired glucose tolerance) 06/23/2016  . Vitreous floaters 12/08/2015  . TIA (transient ischemic attack) 12/08/2015  . SOB (shortness of breath) 12/08/2015  . Posterior vitreous detachment of both eyes 12/08/2015  . Pain due to dental caries 12/08/2015  . Other abnormal glucose 12/08/2015  . Osteoarthrosis 12/08/2015  . Long term (current) use of anticoagulants 12/08/2015  . Kidney mass 12/08/2015  . Hypersomnolence 12/08/2015  . Hyperopia with presbyopia of both eyes 12/08/2015  . Hyperlipidemia 12/08/2015  . History of orthopnea 12/08/2015  . COPD  (chronic obstructive pulmonary disease) ([place]) 12/08/2015  . 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 AKI (acute kidney injury) ([place]) 12/08/2015  . Abnormal CT scan, kidney 12/08/2015  . Abdominal bloating 12/08/2015  . Biventricular ICD (implantable cardioverter-defibrillator) in place 12/07/2015  . Chest pain at rest 11/29/2015  . Essential hypertension 11/29/2015  . Acute on chronic systolic CHF (congestive heart failure), NYHA class 1 ([place]) 11/29/2015  . Gastroesophageal reflux disease without esophagitis 11/29/2015  . Pain in the chest   . Renal cancer ([place]) 08/16/2015  . 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 Cancer of kidney ([place]) 01/19/2015  . Hematuria 09/04/2014  . Psychosexual dysfunction with inhibited sexual excitement 07/29/2014  . Pelvic pain in male 07/29/2014  . Increased urinary frequency 07/29/2014  . Heartburn 07/29/2014  . EKG, abnormal 07/29/2014  . Corneal scar 07/29/2014  . 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 Cardiomyopathy ([place]) 07/29/2014  . CAD (coronary artery disease) 07/29/2014  . Disorder of kidney and ureter 04/09/2014    Past Surgical History:  Procedure Laterality Date  . CARDIAC CATHETERIZATION  1980's  . IMPLANTABLE CARDIOVERTER DEFIBRILLATOR IMPLANT  07/21/2010  . IR GENERIC HISTORICAL  12/23/2014   IR RADIOLOGIST EVAL & MGMT 12/23/2014 [name], MD [facility] INTERV RAD  . IR GENERIC HISTORICAL  07/20/2016   IR RADIOLOGIST EVAL & MGMT 07/20/2016 [facility] INTERV RAD  . IR RADIOLOGIST EVAL & MGMT  07/04/2017  . RENAL CRYOABLATION Left 05/2014  Home Medications    Prior to Admission medications   Medication Sig Start Date End Date Taking? Authorizing Provider  aspirin 81 MG chewable tablet Chew 81 mg by mouth.    [provider]  benzonatate (TESSALON) 100 MG capsule Take 1 capsule (100 mg total) by mouth every 8 (eight) hours. 08/15/17   Palumbo, April, MD  carvedilol (COREG) 12.5 MG tablet Take 12.5 mg by  mouth 2 (two) times daily with a meal.     [provider]  esomeprazole (NEXIUM) 20 MG capsule Take 20 mg by mouth daily as needed (FOR HEARTBURN).    [provider]  Fluticasone-Salmeterol (ADVAIR HFA IN) Inhale into the lungs.    [provider]  fosinopril (MONOPRIL) 10 MG tablet Take 5 mg by mouth daily.    [provider]  furosemide (LASIX) 20 MG tablet Take 1.5 tablets (30 mg total) by mouth every morning. 12/04/15   Hosie Poisson, MD  loratadine (CLARITIN) 10 MG tablet Take 1 tablet (10 mg total) by mouth daily. 08/15/17   Palumbo, April, MD  warfarin (COUMADIN) 5 MG tablet Take 5 mg by mouth daily.    [provider]    Family History Family History  Problem Relation Age of Onset  . Heart disease Father   . Heart attack Father 68  . Hypertension Father   . Hypertension Mother     Social History Social History   Tobacco Use  . Smoking status: Former Smoker    Packs/day: 0.75    Years: 17.00    Pack years: 12.75    Types: Cigarettes    Start date: 05/08/1959    Last attempt to quit: 06/20/1976    Years since quitting: 41.6  . Smokeless tobacco: Never Used  Substance Use Topics  . Alcohol use: No  . Drug use: No     Allergies   Patient has no known allergies.   Review of Systems Review of Systems  Constitutional: Negative for chills and fever.  HENT: Negative for facial swelling and sore throat.   Respiratory: Positive for shortness of breath.   Cardiovascular: Positive for chest pain and leg swelling.  Gastrointestinal: Negative for abdominal pain, nausea and vomiting.  Genitourinary: Negative for dysuria.  Musculoskeletal: Negative for back pain.  Skin: Negative for rash and wound.  Neurological: Negative for headaches.  Psychiatric/Behavioral: The patient is not nervous/anxious.      Physical Exam Updated Vital Signs BP 107/81 (BP Location: Right Arm)   Pulse 82   Temp 97.9 F (36.6 C) (Oral)   Resp 20    SpO2 99%   Physical Exam  Constitutional: He appears well-developed and well-nourished. No distress.  HENT:  Head: Normocephalic and atraumatic.  Mouth/Throat: Oropharynx is clear and moist. No oropharyngeal exudate.  Eyes: Conjunctivae are normal. Pupils are equal, round, and reactive to light. Right eye exhibits no discharge. Left eye exhibits no discharge. No scleral icterus.  Neck: Normal range of motion. Neck supple. No thyromegaly present.  Cardiovascular: Normal rate, regular rhythm, normal heart sounds and intact distal pulses. Exam reveals no gallop and no friction rub.  No murmur heard. Pulmonary/Chest: Effort normal and breath sounds normal. No stridor. No respiratory distress. He has no wheezes. He has no rales.  Abdominal: Soft. Bowel sounds are normal. He exhibits no distension. There is no tenderness. There is no rebound and no guarding.  Musculoskeletal:       Right lower leg: He exhibits edema (1+). He exhibits no tenderness.  Left lower leg: He exhibits edema (1+). He exhibits no tenderness.  Lymphadenopathy:    He has no cervical adenopathy.  Neurological: He is alert. Coordination normal.  Skin: Skin is warm and dry. No rash noted. He is not diaphoretic. No pallor.  Psychiatric: He has a normal mood and affect.  Nursing note and vitals reviewed.    ED Treatments / Results  Labs (all labs ordered are listed, but only abnormal results are displayed) Labs Reviewed  BASIC METABOLIC PANEL - Abnormal; Notable for the following components:      Result Value   Glucose, Bld 134 (*)    BUN 30 (*)    Creatinine, Ser 1.65 (*)    Calcium 8.4 (*)    GFR calc non Af Amer 39 (*)    GFR calc Af Amer 45 (*)    All other components within normal limits  CBC WITH DIFFERENTIAL/PLATELET - Abnormal; Notable for the following components:   Hemoglobin 12.3 (*)    HCT 35.7 (*)    MCV 76.3 (*)    All other components within normal limits  TROPONIN I - Abnormal; Notable for  the following components:   Troponin I 0.04 (*)    All other components within normal limits  BRAIN NATRIURETIC PEPTIDE - Abnormal; Notable for the following components:   B Natriuretic Peptide 2,157.5 (*)    All other components within normal limits    EKG  EKG Interpretation  Date/Time:  Saturday February 02 2018 18:25:46 EST Ventricular Rate:  70 PR Interval:  200 QRS Duration: 142 QT Interval:  464 QTC Calculation: 501 R Axis:   -67 Text Interpretation:  Atrial-sensed ventricular-paced rhythm with occasional Premature ventricular complexes Abnormal ECG Confirmed by Tanna Furry [id number]) on 02/02/2018 6:33:33 PM       Radiology Dg Chest 2 View  Result Date: 02/02/2018 CLINICAL DATA:  Shortness of breath. EXAM: CHEST  2 VIEW COMPARISON:  January 18, 2018 FINDINGS: Stable cardiomegaly and AICD device. No pneumothorax. No pulmonary nodules, masses, focal infiltrates, or edema. IMPRESSION: No active cardiopulmonary disease. Electronically Signed   By: Dorise Bullion III M.D   On: 02/02/2018 19:42    Procedures Procedures (including critical care time)  Medications Ordered in ED Medications  furosemide (LASIX) injection 20 mg (20 mg Intravenous Given 02/02/18 2034)     Initial Impression / Assessment and Plan / ED Course  I have reviewed the triage vital signs and the nursing notes.  Pertinent labs & imaging results that were available during my care of the patient were reviewed by me and considered in my medical decision making (see chart for details).     Patient with acute on chronic CHF exacerbation, much improved after 20 mg Lasix in the ED.  Labs are stable from baseline.  Patient with persistent elevated troponin, today at 0.04.  BNP is persistently over 2000.  CBC shows hemoglobin 12.3.  BUN and creatinine are stable, 30 and 1.65 respectively. CXR is negative.  Patient advised to double his Lasix dose to 80 mg for the next 3 days and follow-up with his PCP.  Patient  ambulated prior to discharge is asymptomatic without chest pain or shortness of breath.  He is saturating 99% on room air prior to discharge.  Return precautions discussed.  Patient understands and agrees with plan.  Patient vitals stable and discharged in satisfactory condition.  Patient also evaluated by Dr. Jeneen Rinks who guided the patient's management and agrees with plan.  Final Clinical Impressions(s) /  ED Diagnoses   Final diagnoses:  Acute on chronic right-sided congestive heart failure (HCC)  Chest pain, unspecified type  Shortness of breath    ED Discharge Orders    None       Frederica Kuster, PA-C 02/02/18 2316    Tanna Furry, MD 02/02/18 2340

## 2018-02-02 NOTE — ED Provider Notes (Signed)
.    Discussed with Cristie Hem law PA-C.  I agree with her assessment.  Patient has symmetric lower extremity edema.  He has clear lungs.  His x-ray shows cardiomegaly, but no acute CHF/edema.  He is well oxygenated on room air.  He is asymptomatic only with ambulation.  On his last visit he was given Lasix and improved markedly.  He has chronic 2 pillow orthopnea.  He has minimal elevation of his troponin which is consistent with his historic baseline.  I have offered him a increase of his Lasix at home versus IV Lasix tonight.  I did warn him that to him with IV Lasix and that he would likely have frequent urination tonight.  He prefers this route.  He is given IV Lasix 40 mg here.  We will double his 40 mg daily dose for the next 3 days.  Daily weights.  Primary care follow-up.  ER with acute changes or worsening.   Tanna Furry, MD 02/02/18 2030

## 2018-02-02 NOTE — ED Triage Notes (Addendum)
Chest pain and SOB x 1 week. SOB worsens with exertion.

## 2018-02-02 NOTE — Discharge Instructions (Signed)
Double your Lasix dose for the next 3 days (80 mg) and then go back to your 40 mg.  Please see your doctor in 3-4 days for recheck.  Please return to the emergency department if you develop any new or worsening symptoms.

## 2018-02-26 ENCOUNTER — Emergency Department (HOSPITAL_BASED_OUTPATIENT_CLINIC_OR_DEPARTMENT_OTHER)
Admission: EM | Admit: 2018-02-26 | Discharge: 2018-02-26 | Disposition: A | Discharge status: Home / Self Care | Payer: Medicare Other | Attending: Emergency Medicine | Admitting: Emergency Medicine

## 2018-02-26 ENCOUNTER — Other Ambulatory Visit: Payer: Self-pay

## 2018-02-26 ENCOUNTER — Encounter (HOSPITAL_BASED_OUTPATIENT_CLINIC_OR_DEPARTMENT_OTHER): Payer: Self-pay | Admitting: Emergency Medicine

## 2018-02-26 ENCOUNTER — Emergency Department (HOSPITAL_BASED_OUTPATIENT_CLINIC_OR_DEPARTMENT_OTHER): Payer: Medicare Other

## 2018-02-26 DIAGNOSIS — I5023 Acute on chronic systolic (congestive) heart failure: Secondary | ICD-10-CM

## 2018-02-26 DIAGNOSIS — Z8673 Personal history of transient ischemic attack (TIA), and cerebral infarction without residual deficits: Secondary | ICD-10-CM | POA: Insufficient documentation

## 2018-02-26 DIAGNOSIS — Z7901 Long term (current) use of anticoagulants: Secondary | ICD-10-CM | POA: Insufficient documentation

## 2018-02-26 DIAGNOSIS — I110 Hypertensive heart disease with heart failure: Secondary | ICD-10-CM | POA: Insufficient documentation

## 2018-02-26 DIAGNOSIS — J449 Chronic obstructive pulmonary disease, unspecified: Secondary | ICD-10-CM | POA: Diagnosis not present

## 2018-02-26 DIAGNOSIS — Z87891 Personal history of nicotine dependence: Secondary | ICD-10-CM | POA: Insufficient documentation

## 2018-02-26 DIAGNOSIS — I2510 Atherosclerotic heart disease of native coronary artery without angina pectoris: Secondary | ICD-10-CM | POA: Insufficient documentation

## 2018-02-26 DIAGNOSIS — Z95810 Presence of automatic (implantable) cardiac defibrillator: Secondary | ICD-10-CM | POA: Insufficient documentation

## 2018-02-26 DIAGNOSIS — Z85528 Personal history of other malignant neoplasm of kidney: Secondary | ICD-10-CM | POA: Diagnosis not present

## 2018-02-26 DIAGNOSIS — Z79899 Other long term (current) drug therapy: Secondary | ICD-10-CM | POA: Diagnosis not present

## 2018-02-26 DIAGNOSIS — R0602 Shortness of breath: Secondary | ICD-10-CM | POA: Diagnosis present

## 2018-02-26 LAB — BASIC METABOLIC PANEL
ANION GAP: 11 (ref 5–15)
BUN: 37 mg/dL — ABNORMAL HIGH (ref 6–20)
CALCIUM: 9.2 mg/dL (ref 8.9–10.3)
CO2: 26 mmol/L (ref 22–32)
Chloride: 103 mmol/L (ref 101–111)
Creatinine, Ser: 2.07 mg/dL — ABNORMAL HIGH (ref 0.61–1.24)
GFR calc non Af Amer: 29 mL/min — ABNORMAL LOW (ref >60)
GFR, EST AFRICAN AMERICAN: 34 mL/min — AB (ref >60)
Glucose, Bld: 167 mg/dL — ABNORMAL HIGH (ref 65–99)
POTASSIUM: 4.3 mmol/L (ref 3.5–5.1)
Sodium: 140 mmol/L (ref 135–145)

## 2018-02-26 LAB — CBC
HEMATOCRIT: 39.6 % (ref 39.0–52.0)
HEMOGLOBIN: 13.5 g/dL (ref 13.0–17.0)
MCH: 25.8 pg — ABNORMAL LOW (ref 26.0–34.0)
MCHC: 34.1 g/dL (ref 30.0–36.0)
MCV: 75.6 fL — ABNORMAL LOW (ref 78.0–100.0)
Platelets: 157 10*3/uL (ref 150–400)
RBC: 5.24 MIL/uL (ref 4.22–5.81)
RDW: 16.1 % — ABNORMAL HIGH (ref 11.5–15.5)
WBC: 5.1 10*3/uL (ref 4.0–10.5)

## 2018-02-26 LAB — TROPONIN I: Troponin I: 0.07 ng/mL (ref <0.03)

## 2018-02-26 LAB — MAGNESIUM: MAGNESIUM: 2.4 mg/dL (ref 1.7–2.4)

## 2018-02-26 LAB — PROTIME-INR
INR: 2.19
PROTHROMBIN TIME: 24.2 s — AB (ref 11.4–15.2)

## 2018-02-26 MED ORDER — METOLAZONE 5 MG PO TABS
5.0000 mg | ORAL_TABLET | Freq: Once | ORAL | Status: DC
  Filled 2018-02-26: qty 1

## 2018-02-26 MED ORDER — FUROSEMIDE 10 MG/ML IJ SOLN
40.0000 mg | Freq: Once | INTRAMUSCULAR | Status: AC
  Administered 2018-02-26: 40 mg via INTRAVENOUS
  Filled 2018-02-26: qty 4

## 2018-02-26 MED ORDER — FUROSEMIDE 10 MG/ML IJ SOLN
60.0000 mg | Freq: Once | INTRAMUSCULAR | Status: AC
  Administered 2018-02-26: 60 mg via INTRAVENOUS
  Filled 2018-02-26: qty 6

## 2018-02-26 NOTE — ED Notes (Signed)
Pt starting to feel better, eating a snack.

## 2018-02-26 NOTE — ED Notes (Signed)
Pt is not ready to ambulate at this time, he will ring out when he is

## 2018-02-26 NOTE — ED Notes (Signed)
EDP at bedside doing ultrasound

## 2018-02-26 NOTE — ED Notes (Signed)
Pt wants to wait on ambulating, he wants to catch his breath first. RN Sarah informed.

## 2018-02-26 NOTE — ED Notes (Signed)
Pt ambulate on room air with pulse Ox. Pt Started at 100% at rest, Pt ranged 98% to 94% during ambulate around back half of nurses station.

## 2018-02-26 NOTE — ED Triage Notes (Signed)
Patient states that he has been having SOB - Patient reports that he gets more SOb with walking

## 2018-02-26 NOTE — Discharge Instructions (Signed)
Double your lasix to 40mg  every day for the next week.  Call and schedule an appointment with your cardiologist.

## 2018-02-26 NOTE — ED Provider Notes (Signed)
[facility] EMERGENCY DEPARTMENT Provider Note   CSN: [id number] Arrival date & time: 02/26/18  1357     History   Chief Complaint Chief Complaint  Patient presents with  . Shortness of Breath    HPI Spencer Rice is a 77 y.o. male.  77 yo M with a chief complaint of shortness of breath and fatigue.  Going on for the past week or so.  He felt that it be getting worse over the past few days and so decided to come to the emergency department.  Today he was at the grocery store and had to use an electrical wheelchair to get around.  When he got home he had trouble putting the bags away without becoming more short of breath and so came here.  The patient states that previously it had been his heart failure but he thinks this feels slightly different.  He denies chest pain denies significant edema.  Denies cough or congestion.  Denies vomiting or diarrhea.  Has been eating and drinking normally.  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  Last check was 2.4 per him.  The history is provided by the patient.  Shortness of Breath  This is a recurrent problem. The average episode lasts 1 week. The problem occurs frequently.The current episode started yesterday. The problem has not changed since onset.Pertinent negatives include no fever, no headaches, no chest pain, no vomiting, no abdominal pain and no rash. He has tried nothing for the symptoms. The treatment provided no relief. He has had prior hospitalizations. He has had prior ED visits. Associated medical issues include heart failure.    Past Medical History:  Diagnosis Date  . AICD (automatic cardioverter/defibrillator) present   . Cancer of left kidney ([place])    S/P OR 05/2014  . CHF (congestive heart failure) ([place])   . Coronary artery disease   . DVT (deep venous thrombosis) ([facility]) 1983   LLE  . GERD (gastroesophageal reflux disease)   . History of hiatal hernia   . Hypertension      Patient Active Problem List   Diagnosis Date Noted  . Glucose intolerance (impaired glucose tolerance) 06/23/2016  . Vitreous floaters 12/08/2015  . TIA (transient ischemic attack) 12/08/2015  . SOB (shortness of breath) 12/08/2015  . Posterior vitreous detachment of both eyes 12/08/2015  . Pain due to dental caries 12/08/2015  . Other abnormal glucose 12/08/2015  . Osteoarthrosis 12/08/2015  . Long term (current) use of anticoagulants 12/08/2015  . Kidney mass 12/08/2015  . Hypersomnolence 12/08/2015  . Hyperopia with presbyopia of both eyes 12/08/2015  . Hyperlipidemia 12/08/2015  . History of orthopnea 12/08/2015  . COPD (chronic obstructive pulmonary disease) ([place]) 12/08/2015  . 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 AKI (acute kidney injury) ([place]) 12/08/2015  . Abnormal CT scan, kidney 12/08/2015  . Abdominal bloating 12/08/2015  . Biventricular ICD (implantable cardioverter-defibrillator) in place 12/07/2015  . Chest pain at rest 11/29/2015  . Essential hypertension 11/29/2015  . Acute on chronic systolic CHF (congestive heart failure), NYHA class 1 ([place]) 11/29/2015  . Gastroesophageal reflux disease without esophagitis 11/29/2015  . Pain in the chest   . Renal cancer ([place]) 08/16/2015  . 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 Cancer of kidney ([place]) 01/19/2015  . Hematuria 09/04/2014  . Psychosexual dysfunction with inhibited sexual excitement 07/29/2014  . Pelvic pain in male 07/29/2014  . Increased urinary frequency 07/29/2014  . Heartburn  07/29/2014  . EKG, abnormal 07/29/2014  . Corneal scar 07/29/2014  . 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 Cardiomyopathy ([place]) 07/29/2014  . CAD (coronary artery disease) 07/29/2014  . Disorder of kidney and ureter 04/09/2014    Past Surgical History:  Procedure Laterality Date  . CARDIAC CATHETERIZATION  1980's  . IMPLANTABLE CARDIOVERTER DEFIBRILLATOR IMPLANT  07/21/2010  . IR GENERIC HISTORICAL   12/23/2014   IR RADIOLOGIST EVAL & MGMT 12/23/2014 [name], MD [facility] INTERV RAD  . IR GENERIC HISTORICAL  07/20/2016   IR RADIOLOGIST EVAL & MGMT 07/20/2016 [facility] INTERV RAD  . IR RADIOLOGIST EVAL & MGMT  07/04/2017  . RENAL CRYOABLATION Left 05/2014        Home Medications    Prior to Admission medications   Medication Sig Start Date End Date Taking? Authorizing Provider  aspirin 81 MG chewable tablet Chew 81 mg by mouth.    [provider]  benzonatate (TESSALON) 100 MG capsule Take 1 capsule (100 mg total) by mouth every 8 (eight) hours. 08/15/17   Palumbo, April, MD  carvedilol (COREG) 12.5 MG tablet Take 12.5 mg by mouth 2 (two) times daily with a meal.     [provider]  esomeprazole (NEXIUM) 20 MG capsule Take 20 mg by mouth daily as needed (FOR HEARTBURN).    [provider]  Fluticasone-Salmeterol (ADVAIR HFA IN) Inhale into the lungs.    [provider]  fosinopril (MONOPRIL) 10 MG tablet Take 5 mg by mouth daily.    [provider]  furosemide (LASIX) 20 MG tablet Take 1.5 tablets (30 mg total) by mouth every morning. 12/04/15   Hosie Poisson, MD  loratadine (CLARITIN) 10 MG tablet Take 1 tablet (10 mg total) by mouth daily. 08/15/17   Palumbo, April, MD  warfarin (COUMADIN) 5 MG tablet Take 5 mg by mouth daily.    [provider]    Family History Family History  Problem Relation Age of Onset  . Heart disease Father   . Heart attack Father 51  . Hypertension Father   . Hypertension Mother     Social History Social History   Tobacco Use  . Smoking status: Former Smoker    Packs/day: 0.75    Years: 17.00    Pack years: 12.75    Types: Cigarettes    Start date: 05/08/1959    Last attempt to quit: 06/20/1976    Years since quitting: 41.7  . Smokeless tobacco: Never Used  Substance Use Topics  . Alcohol use: No  . Drug use: No     Allergies   Patient has no known allergies.   Review of  Systems Review of Systems  Constitutional: Positive for fatigue. Negative for chills and fever.  HENT: Negative for congestion and facial swelling.   Eyes: Negative for discharge and visual disturbance.  Respiratory: Positive for shortness of breath.   Cardiovascular: Negative for chest pain and palpitations.  Gastrointestinal: Negative for abdominal pain, diarrhea and vomiting.  Musculoskeletal: Negative for arthralgias and myalgias.  Skin: Negative for color change and rash.  Neurological: Negative for tremors, syncope and headaches.  Psychiatric/Behavioral: Negative for confusion and dysphoric mood.     Physical Exam Updated Vital Signs BP 111/69 (BP Location: Left Arm)   Pulse 80   Temp (!) 97.4 F (36.3 C) (Oral)   Resp 19   Ht 6\' 1"  (1.854 m)   Wt 93.9 kg (207 lb)   SpO2 100%   BMI 27.31 kg/m  Physical Exam  Constitutional: He is oriented to person, place, and time. He appears well-developed and well-nourished.  HENT:  Head: Normocephalic and atraumatic.  Eyes: Pupils are equal, round, and reactive to light. EOM are normal.  Neck: Normal range of motion. Neck supple. JVD (to mid neck) present.  Cardiovascular: Normal rate and regular rhythm. Exam reveals no gallop and no friction rub.  No murmur heard. Pulmonary/Chest: No respiratory distress. He has no wheezes. He has no rales.  Abdominal: He exhibits no distension. There is no rebound and no guarding.  Musculoskeletal: Normal range of motion.       Right lower leg: He exhibits edema.       Left lower leg: He exhibits edema (trace bilaterally).  Neurological: He is alert and oriented to person, place, and time.  Skin: No rash noted. No pallor.  Psychiatric: He has a normal mood and affect. His behavior is normal.  Nursing note and vitals reviewed.    ED Treatments / Results  Labs (all labs ordered are listed, but only abnormal results are displayed) Labs Reviewed  BASIC METABOLIC PANEL - Abnormal; Notable  for the following components:      Result Value   Glucose, Bld 167 (*)    BUN 37 (*)    Creatinine, Ser 2.07 (*)    GFR calc non Af Amer 29 (*)    GFR calc Af Amer 34 (*)    All other components within normal limits  CBC - Abnormal; Notable for the following components:   MCV 75.6 (*)    MCH 25.8 (*)    RDW 16.1 (*)    All other components within normal limits  TROPONIN I - Abnormal; Notable for the following components:   Troponin I 0.07 (*)    All other components within normal limits  PROTIME-INR - Abnormal; Notable for the following components:   Prothrombin Time 24.2 (*)    All other components within normal limits  MAGNESIUM    EKG EKG Interpretation  Date/Time:  Tuesday February 26 2018 14:07:06 EDT Ventricular Rate:  75 PR Interval:    QRS Duration: 162 QT Interval:  450 QTC Calculation: 503 R Axis:   -80 Text Interpretation:  Atrial-sensed ventricular-paced complexes No further analysis attempted due to paced rhythm frequent pvc Otherwise no significant change Confirmed by Deno Etienne [phone]) on 02/26/2018 2:11:35 PM   Radiology Dg Chest 2 View  Result Date: 02/26/2018 CLINICAL DATA:  Shortness of breath without chest pain. History of coronary artery disease, CHF, pacemaker defibrillator placement. Also history of COPD, former smoker. EXAM: CHEST - 2 VIEW COMPARISON:  Chest x-ray of February 02, 2018 FINDINGS: The lungs are well-expanded. The cardiac silhouette remains enlarged. The pulmonary vascularity is not engorged. The ICD is in stable position. There is calcification in the wall of the aortic arch. There is no pleural effusion. The bony thorax exhibits no acute abnormality. IMPRESSION: Cardiomegaly, stable. One cannot exclude a pericardial effusion. No pulmonary edema or acute pneumonia. Thoracic aortic atherosclerosis. Electronically Signed   By: David  Martinique M.D.   On: 02/26/2018 14:42    Procedures Procedures (including critical care time)   EMERGENCY DEPARTMENT  [place] CARDIAC EXAM "Study: Limited Ultrasound of the Heart and Pericardium"  INDICATIONS:Dyspnea Multiple views of the heart and pericardium were obtained in real-time with a multi-frequency probe.  PERFORMED EQ:ASTMHD IMAGES ARCHIVED?: Yes LIMITATIONS:  Body habitus VIEWS USED: Subcostal 4 chamber INTERPRETATION: Cardiac activity present, Pericardial effusioin absent, Cardiac tamponade absent, Decreased contractility and  Done due to possible pericardial effusion seen on cxr, negative on [place]   Medications Ordered in ED Medications  furosemide (LASIX) injection 40 mg (40 mg Intravenous Given 02/26/18 1541)  furosemide (LASIX) injection 60 mg (60 mg Intravenous Given 02/26/18 1744)     Initial Impression / Assessment and Plan / ED Course  I have reviewed the triage vital signs and the nursing notes.  Pertinent labs & imaging results that were available during my care of the patient were reviewed by me and considered in my medical decision making (see chart for details).     77 yo M with a chief complaint of shortness of breath.  Patient looks fluid overloaded on exam though I do not hear any fluid in his lungs.  Will obtain labs chest x-ray reevaluate.   CXR with possible pericardial effusion as read by radiology.  Patient x-ray reviewed by me looks most likely like fluid overload.  Patient's troponin is very mildly elevated and at his baseline.  He has a mild creatinine bump.  Otherwise his labs are unremarkable.  We will give the patient Lasix, ambulate in the ED.  Patient feeling better, dc home.   10:07 AM:  I have discussed the diagnosis/risks/treatment options with the patient and family and believe the pt to be eligible for discharge home to follow-up with PCP. We also discussed returning to the ED immediately if new or worsening sx occur. We discussed the sx which are most concerning (e.g., sudden worsening pain, fever, inability to tolerate by mouth) that necessitate immediate  return. Medications administered to the patient during their visit and any new prescriptions provided to the patient are listed below.  Medications given during this visit Medications  furosemide (LASIX) injection 40 mg (40 mg Intravenous Given 02/26/18 1541)  furosemide (LASIX) injection 60 mg (60 mg Intravenous Given 02/26/18 1744)     The patient appears reasonably screen and/or stabilized for discharge and I doubt any other medical condition or other [facility] requiring further screening, evaluation, or treatment in the ED at this time prior to discharge.    Final Clinical Impressions(s) / ED Diagnoses   Final diagnoses:  Acute on chronic systolic congestive heart failure [facility])    ED Discharge Orders    None       Deno Etienne, DO 02/27/18 1007

## 2018-02-26 NOTE — ED Notes (Signed)
Pt is still not ready to ambulate, RN Sarah informed

## 2018-03-12 ENCOUNTER — Telehealth: Payer: Self-pay

## 2018-03-12 ENCOUNTER — Other Ambulatory Visit (HOSPITAL_COMMUNITY): Payer: Self-pay | Admitting: Interventional Radiology

## 2018-03-12 DIAGNOSIS — C642 Malignant neoplasm of left kidney, except renal pelvis: Secondary | ICD-10-CM

## 2018-03-12 NOTE — Telephone Encounter (Signed)
Called patient to let him know that Dr. Kathlene Cote wants him to see a nephrologist for his current kidney issues; that they are a medical issue.  If he does not have a nephrologist, then he needs to call his PCP (Dr. Lajoyce Corners) for a referral.  Spencer Rice stated he understands this and thanked [place] for our assistance.  Brita Romp, RN

## 2018-03-28 ENCOUNTER — Other Ambulatory Visit: Payer: Medicare Other

## 2018-04-16 ENCOUNTER — Other Ambulatory Visit (HOSPITAL_COMMUNITY): Payer: Self-pay | Admitting: Interventional Radiology

## 2018-04-16 ENCOUNTER — Other Ambulatory Visit: Payer: Self-pay | Admitting: *Deleted

## 2018-04-24 ENCOUNTER — Other Ambulatory Visit: Payer: Self-pay | Admitting: Radiology

## 2018-04-24 ENCOUNTER — Other Ambulatory Visit (HOSPITAL_COMMUNITY): Payer: Self-pay | Admitting: Interventional Radiology

## 2018-04-24 DIAGNOSIS — C642 Malignant neoplasm of left kidney, except renal pelvis: Secondary | ICD-10-CM

## 2018-05-03 ENCOUNTER — Emergency Department (HOSPITAL_BASED_OUTPATIENT_CLINIC_OR_DEPARTMENT_OTHER): Payer: Medicare Other

## 2018-05-03 ENCOUNTER — Inpatient Hospital Stay (HOSPITAL_BASED_OUTPATIENT_CLINIC_OR_DEPARTMENT_OTHER)
Admission: EM | Admit: 2018-05-03 | Discharge: 2018-05-05 | DRG: 291 | Disposition: A | Discharge status: Home / Self Care | Payer: Medicare Other | Attending: Cardiology | Admitting: Cardiology

## 2018-05-03 ENCOUNTER — Other Ambulatory Visit: Payer: Self-pay

## 2018-05-03 ENCOUNTER — Encounter (HOSPITAL_BASED_OUTPATIENT_CLINIC_OR_DEPARTMENT_OTHER): Payer: Self-pay | Admitting: *Deleted

## 2018-05-03 DIAGNOSIS — Z7951 Long term (current) use of inhaled steroids: Secondary | ICD-10-CM

## 2018-05-03 DIAGNOSIS — I5023 Acute on chronic systolic (congestive) heart failure: Secondary | ICD-10-CM | POA: Diagnosis not present

## 2018-05-03 DIAGNOSIS — N179 Acute kidney failure, unspecified: Secondary | ICD-10-CM | POA: Diagnosis present

## 2018-05-03 DIAGNOSIS — Z9981 Dependence on supplemental oxygen: Secondary | ICD-10-CM

## 2018-05-03 DIAGNOSIS — J449 Chronic obstructive pulmonary disease, unspecified: Secondary | ICD-10-CM | POA: Diagnosis not present

## 2018-05-03 DIAGNOSIS — I2510 Atherosclerotic heart disease of native coronary artery without angina pectoris: Secondary | ICD-10-CM | POA: Diagnosis present

## 2018-05-03 DIAGNOSIS — Z8679 Personal history of other diseases of the circulatory system: Secondary | ICD-10-CM

## 2018-05-03 DIAGNOSIS — K219 Gastro-esophageal reflux disease without esophagitis: Secondary | ICD-10-CM | POA: Diagnosis present

## 2018-05-03 DIAGNOSIS — N184 Chronic kidney disease, stage 4 (severe): Secondary | ICD-10-CM | POA: Diagnosis present

## 2018-05-03 DIAGNOSIS — Z87891 Personal history of nicotine dependence: Secondary | ICD-10-CM | POA: Diagnosis not present

## 2018-05-03 DIAGNOSIS — I130 Hypertensive heart and chronic kidney disease with heart failure and stage 1 through stage 4 chronic kidney disease, or unspecified chronic kidney disease: Principal | ICD-10-CM | POA: Diagnosis present

## 2018-05-03 DIAGNOSIS — I428 Other cardiomyopathies: Secondary | ICD-10-CM | POA: Diagnosis not present

## 2018-05-03 DIAGNOSIS — E1122 Type 2 diabetes mellitus with diabetic chronic kidney disease: Secondary | ICD-10-CM | POA: Diagnosis not present

## 2018-05-03 DIAGNOSIS — I509 Heart failure, unspecified: Secondary | ICD-10-CM | POA: Diagnosis present

## 2018-05-03 DIAGNOSIS — Z7982 Long term (current) use of aspirin: Secondary | ICD-10-CM

## 2018-05-03 DIAGNOSIS — Z85528 Personal history of other malignant neoplasm of kidney: Secondary | ICD-10-CM | POA: Diagnosis not present

## 2018-05-03 DIAGNOSIS — Z79899 Other long term (current) drug therapy: Secondary | ICD-10-CM

## 2018-05-03 DIAGNOSIS — Z7901 Long term (current) use of anticoagulants: Secondary | ICD-10-CM | POA: Diagnosis not present

## 2018-05-03 DIAGNOSIS — Z95810 Presence of automatic (implantable) cardiac defibrillator: Secondary | ICD-10-CM

## 2018-05-03 DIAGNOSIS — Z86718 Personal history of other venous thrombosis and embolism: Secondary | ICD-10-CM | POA: Diagnosis not present

## 2018-05-03 LAB — CBC WITH DIFFERENTIAL/PLATELET
Basophils Absolute: 0 10*3/uL (ref 0.0–0.1)
Basophils Relative: 1 %
EOS ABS: 0 10*3/uL (ref 0.0–0.7)
EOS PCT: 1 %
HCT: 38.6 % — ABNORMAL LOW (ref 39.0–52.0)
HEMOGLOBIN: 13.1 g/dL (ref 13.0–17.0)
LYMPHS ABS: 0.9 10*3/uL (ref 0.7–4.0)
Lymphocytes Relative: 21 %
MCH: 23.8 pg — AB (ref 26.0–34.0)
MCHC: 33.9 g/dL (ref 30.0–36.0)
MCV: 70.1 fL — ABNORMAL LOW (ref 78.0–100.0)
MONO ABS: 0.6 10*3/uL (ref 0.1–1.0)
Monocytes Relative: 15 %
Neutro Abs: 2.6 10*3/uL (ref 1.7–7.7)
Neutrophils Relative %: 62 %
PLATELETS: 194 10*3/uL (ref 150–400)
RBC: 5.51 MIL/uL (ref 4.22–5.81)
RDW: 18.4 % — ABNORMAL HIGH (ref 11.5–15.5)
WBC: 4.2 10*3/uL (ref 4.0–10.5)

## 2018-05-03 LAB — COMPREHENSIVE METABOLIC PANEL
ALT: 24 U/L (ref 17–63)
AST: 28 U/L (ref 15–41)
Albumin: 3.6 g/dL (ref 3.5–5.0)
Alkaline Phosphatase: 79 U/L (ref 38–126)
Anion gap: 11 (ref 5–15)
BILIRUBIN TOTAL: 1.9 mg/dL — AB (ref 0.3–1.2)
BUN: 41 mg/dL — AB (ref 6–20)
CALCIUM: 8.9 mg/dL (ref 8.9–10.3)
CO2: 26 mmol/L (ref 22–32)
CREATININE: 2.42 mg/dL — AB (ref 0.61–1.24)
Chloride: 100 mmol/L — ABNORMAL LOW (ref 101–111)
GFR, EST AFRICAN AMERICAN: 28 mL/min — AB (ref >60)
GFR, EST NON AFRICAN AMERICAN: 24 mL/min — AB (ref >60)
Glucose, Bld: 120 mg/dL — ABNORMAL HIGH (ref 65–99)
Potassium: 4.4 mmol/L (ref 3.5–5.1)
Sodium: 137 mmol/L (ref 135–145)
TOTAL PROTEIN: 6.9 g/dL (ref 6.5–8.1)

## 2018-05-03 LAB — TROPONIN I: Troponin I: 0.05 ng/mL (ref <0.03)

## 2018-05-03 LAB — PROTIME-INR
INR: 2.55
PROTHROMBIN TIME: 27.2 s — AB (ref 11.4–15.2)

## 2018-05-03 LAB — LIPASE, BLOOD: LIPASE: 42 U/L (ref 11–51)

## 2018-05-03 LAB — BRAIN NATRIURETIC PEPTIDE: B Natriuretic Peptide: >4500 pg/mL — ABNORMAL HIGH (ref 0.0–100.0)

## 2018-05-03 MED ORDER — SODIUM CHLORIDE 0.9% FLUSH: 3.0000 mL | INTRAVENOUS | Status: DC | PRN

## 2018-05-03 MED ORDER — FUROSEMIDE 10 MG/ML IJ SOLN
40.0000 mg | Freq: Once | INTRAMUSCULAR | Status: AC
  Administered 2018-05-03: 40 mg via INTRAVENOUS
  Filled 2018-05-03: qty 4

## 2018-05-03 MED ORDER — ACETAMINOPHEN 325 MG PO TABS: 650.0000 mg | ORAL_TABLET | ORAL | Status: DC | PRN

## 2018-05-03 MED ORDER — SODIUM CHLORIDE 0.9% FLUSH
3.0000 mL | Freq: Two times a day (BID) | INTRAVENOUS | Status: DC
  Administered 2018-05-04 – 2018-05-05 (×4): 3 mL via INTRAVENOUS

## 2018-05-03 MED ORDER — SODIUM CHLORIDE 0.9 % IV SOLN: 250.0000 mL | INTRAVENOUS | Status: DC | PRN

## 2018-05-03 MED ORDER — ONDANSETRON HCL 4 MG/2ML IJ SOLN: 4.0000 mg | Freq: Four times a day (QID) | INTRAMUSCULAR | Status: DC | PRN

## 2018-05-03 MED ORDER — ALUM & MAG HYDROXIDE-SIMETH 200-200-20 MG/5ML PO SUSP
30.0000 mL | Freq: Once | ORAL | Status: AC
  Administered 2018-05-03: 30 mL via ORAL
  Filled 2018-05-03: qty 30

## 2018-05-03 MED ORDER — ASPIRIN EC 81 MG PO TBEC
81.0000 mg | DELAYED_RELEASE_TABLET | Freq: Every day | ORAL | Status: DC
  Administered 2018-05-04 – 2018-05-05 (×2): 81 mg via ORAL
  Filled 2018-05-03 (×2): qty 1

## 2018-05-03 MED ORDER — FUROSEMIDE 10 MG/ML IJ SOLN: 60.0000 mg | Freq: Once | INTRAMUSCULAR | Status: DC

## 2018-05-03 NOTE — ED Notes (Signed)
Pt eating some ice cream and gram crackers with peanut butter

## 2018-05-03 NOTE — ED Provider Notes (Signed)
Emergency Department Provider Note   I have reviewed the triage vital signs and the nursing notes.   HISTORY  Chief Complaint Shortness of Breath   HPI Spencer Rice is a 77 y.o. male with PMH of CHF with AICD, CAD, HTN, and GERD resents to the emergency department for evaluation of progressively worsening dyspnea and lower leg swelling.  Patient states that his primary care physician started him on torsemide 20 mg tablets daily approximately 1 month ago.  He has been taking these medications with no significant improvement in symptoms.  His spironolactone was also restarted but at a half dose.  Patient states in addition to dyspnea he has had some continued, constant epigastric/left lower chest discomfort.  The pain is not pleuritic or exertional.  No radiation of symptoms.  He has had an upper endoscopy this year which he reports showed some esophageal narrowing but no ulcers.  He has been compliant with his reflux medications but pain continues.  He presented today because his difficulty breathing seem to worsen significantly over the past 12 hours.  He called a family member to drive him to the emergency department.  Past Medical History:  Diagnosis Date  . AICD (automatic cardioverter/defibrillator) present   . Cancer of left kidney ([place])    S/P OR 05/2014  . CHF (congestive heart failure) ([place])   . Coronary artery disease   . DVT (deep venous thrombosis) ([facility]) 1983   LLE  . GERD (gastroesophageal reflux disease)   . History of hiatal hernia   . Hypertension     Patient Active Problem List   Diagnosis Date Noted  . Glucose intolerance (impaired glucose tolerance) 06/23/2016  . Vitreous floaters 12/08/2015  . TIA (transient ischemic attack) 12/08/2015  . SOB (shortness of breath) 12/08/2015  . Posterior vitreous detachment of both eyes 12/08/2015  . Pain due to dental caries 12/08/2015  . Other abnormal glucose 12/08/2015  . Osteoarthrosis 12/08/2015  . Tymara Saur term  (current) use of anticoagulants 12/08/2015  . Kidney mass 12/08/2015  . Hypersomnolence 12/08/2015  . Hyperopia with presbyopia of both eyes 12/08/2015  . Hyperlipidemia 12/08/2015  . History of orthopnea 12/08/2015  . COPD (chronic obstructive pulmonary disease) ([place]) 12/08/2015  . 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 AKI (acute kidney injury) ([place]) 12/08/2015  . Abnormal CT scan, kidney 12/08/2015  . Abdominal bloating 12/08/2015  . Biventricular ICD (implantable cardioverter-defibrillator) in place 12/07/2015  . Chest pain at rest 11/29/2015  . Essential hypertension 11/29/2015  . Acute on chronic systolic CHF (congestive heart failure), NYHA class 1 ([place]) 11/29/2015  . Gastroesophageal reflux disease without esophagitis 11/29/2015  . Pain in the chest   . Renal cancer ([place]) 08/16/2015  . 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 Cancer of kidney ([place]) 01/19/2015  . Hematuria 09/04/2014  . Psychosexual dysfunction with inhibited sexual excitement 07/29/2014  . Pelvic pain in male 07/29/2014  . Increased urinary frequency 07/29/2014  . Heartburn 07/29/2014  . EKG, abnormal 07/29/2014  . Corneal scar 07/29/2014  . 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 Cardiomyopathy ([place]) 07/29/2014  . CAD (coronary artery disease) 07/29/2014  . Disorder of kidney and ureter 04/09/2014    Past Surgical History:  Procedure Laterality Date  . CARDIAC CATHETERIZATION  1980's  . IMPLANTABLE CARDIOVERTER DEFIBRILLATOR IMPLANT  07/21/2010  . IR GENERIC HISTORICAL  12/23/2014   IR RADIOLOGIST EVAL & MGMT 12/23/2014 [name], MD [facility] INTERV RAD  . IR GENERIC HISTORICAL  07/20/2016   IR RADIOLOGIST EVAL & MGMT 07/20/2016 [facility] INTERV RAD  . IR RADIOLOGIST EVAL & MGMT  07/04/2017  . RENAL CRYOABLATION Left 05/2014    Current Outpatient Rx  . Order #: [id number] Class: Historical Med  . Order #: [id number] Class: Historical Med  . Order #: [id number] Class: Historical Med  .  Order #: [id number] Class: Historical Med  . Order #: [id number] Class: Historical Med  . Order #: [id number] Class: Historical Med  . Order #: [id number] Class: Historical Med  . Order #: [id number] Class: Historical Med  . Order #: [id number] Class: Historical Med  . Order #: [id number] Class: Print  . Order #: [id number] Class: Historical Med  . Order #: [id number] Class: Historical Med  . Order #: [id number] Class: Historical Med    Allergies Patient has no known allergies.  Family History  Problem Relation Age of Onset  . Heart disease Father   . Heart attack Father 32  . Hypertension Father   . Hypertension Mother     Social History Social History   Tobacco Use  . Smoking status: Former Smoker    Packs/day: 0.75    Years: 17.00    Pack years: 12.75    Types: Cigarettes    Start date: 05/08/1959    Last attempt to quit: 06/20/1976    Years since quitting: 41.8  . Smokeless tobacco: Never Used  Substance Use Topics  . Alcohol use: No  . Drug use: No    Review of Systems  Constitutional: No fever/chills Eyes: No visual changes. ENT: No sore throat. Cardiovascular: Positive left lower chest pain. Respiratory: Positive shortness of breath. Gastrointestinal: Positive epigastric abdominal pain.  No nausea, no vomiting.  No diarrhea.  No constipation. Genitourinary: Negative for dysuria. Musculoskeletal: Negative for back pain. Skin: Negative for rash. Neurological: Negative for headaches, focal weakness or numbness.  10-point ROS otherwise negative.  ____________________________________________   PHYSICAL EXAM:  VITAL SIGNS: ED Triage Vitals  Enc Vitals Group     BP 05/03/18 1450 95/78     Pulse Rate 05/03/18 1450 70     Resp 05/03/18 1450 (!) 24     Temp --      Temp src --      SpO2 05/03/18 1450 100 %     Weight 05/03/18 1449 205 lb (93 kg)     Height 05/03/18 1449 6\' 1"  (1.854 m)     Pain Score 05/03/18 1448 5   Constitutional: Alert and oriented. Well appearing  and in no acute distress. Eyes: Conjunctivae are normal.  Head: Atraumatic. Nose: No congestion/rhinnorhea. Mouth/Throat: Mucous membranes are moist. Neck: No stridor.  Cardiovascular: Normal rate, regular rhythm. Good peripheral circulation. Grossly normal heart sounds. Tenderness to palpation over the epigastric and left lower chest wall that reproduces his chest pain.  Respiratory: Slight increased respiratory effort.  No retractions. Lungs with crackles at the bases bilaterally.  Gastrointestinal: Soft and nontender. No distention.  Musculoskeletal: No lower extremity tenderness with 2+ pitting edema in bilateral LEs. No gross deformities of extremities. Neurologic:  Normal speech and language. No gross focal neurologic deficits are appreciated.  Skin:  Skin is warm, dry and intact. No rash noted.  ____________________________________________   LABS (all labs ordered are listed, but only abnormal results are displayed)  Labs Reviewed  COMPREHENSIVE METABOLIC PANEL - Abnormal; Notable for the following components:      Result Value   Chloride 100 (*)    Glucose, Bld 120 (*)    BUN 41 (*)    Creatinine, Ser 2.42 (*)  Total Bilirubin 1.9 (*)    GFR calc non Af Amer 24 (*)    GFR calc Af Amer 28 (*)    All other components within normal limits  BRAIN NATRIURETIC PEPTIDE - Abnormal; Notable for the following components:   B Natriuretic Peptide >4,500.0 (*)    All other components within normal limits  TROPONIN I - Abnormal; Notable for the following components:   Troponin I 0.05 (*)    All other components within normal limits  CBC WITH DIFFERENTIAL/PLATELET - Abnormal; Notable for the following components:   HCT 38.6 (*)    MCV 70.1 (*)    MCH 23.8 (*)    RDW 18.4 (*)    All other components within normal limits  PROTIME-INR - Abnormal; Notable for the following components:   Prothrombin Time 27.2 (*)    All other components within normal limits  LIPASE, BLOOD    ____________________________________________  EKG   EKG Interpretation  Date/Time:  Friday May 03 2018 15:04:11 EDT Ventricular Rate:  77 PR Interval:    QRS Duration: 162 QT Interval:  494 QTC Calculation: 560 R Axis:   -57 Text Interpretation:  A-V dual-paced rhythm with some inhibition No further analysis attempted due to paced rhythm Similar to prior.  Confirmed by Nanda Quinton [phone]) on 05/03/2018 3:23:13 PM       ____________________________________________  RADIOLOGY  Dg Chest 2 View  Result Date: 05/03/2018 CLINICAL DATA:  Shortness of breath for 3 months with lower extremity edema. History of smoking and CHF. EXAM: CHEST - 2 VIEW COMPARISON:  03/29/2018 FINDINGS: An ICD remains in place. The cardiac silhouette remains moderately to severely enlarged. Aortic atherosclerosis is noted. There is a new small right pleural effusion with patchy airspace opacity in the right lung base. The left lung is clear. No pneumothorax is identified. No acute osseous abnormality is seen. IMPRESSION: New small right pleural effusion and right basilar airspace opacity which could reflect pneumonia or atelectasis. Electronically Signed   By: Logan Bores M.D.   On: 05/03/2018 16:39    ____________________________________________   PROCEDURES  Procedure(s) performed:   .Critical Care Performed by: Margette Fast, MD Authorized by: Margette Fast, MD   Critical care provider statement:    Critical care time (minutes):  35   Critical care time was exclusive of:  Separately billable procedures and treating other patients and teaching time   Critical care was necessary to treat or prevent imminent or life-threatening deterioration of the following conditions:  Respiratory failure and cardiac failure   Critical care was time spent personally by me on the following activities:  Blood draw for specimens, development of treatment plan with patient or surrogate, discussions with consultants,  evaluation of patient's response to treatment, examination of patient, obtaining history from patient or surrogate, ordering and performing treatments and interventions, ordering and review of laboratory studies, ordering and review of radiographic studies, pulse oximetry, re-evaluation of patient's condition and review of old charts   I assumed direction of critical care for this patient from another provider in my specialty: no       ____________________________________________   INITIAL IMPRESSION / [place] / ED COURSE  Pertinent labs & imaging results that were available during my care of the patient were reviewed by me and considered in my medical decision making (see chart for details).  With past medical history of congestive heart failure and coronary artery disease presents with gradually worsening shortness of breath and lower extremity  edema.  On exam the patient appears to be mildly volume overloaded.  He is on 2 L nasal cannula oxygen which is his baseline.  He does have some tachypnea.  Plan for labs, chest x-ray, reassess.  EKG reviewed with no acute changes.  Labs reviewed showing elevated BNP and mild elevation in troponin. Doubt primary ACS. Reached out to [facility] regarding bed situation and they are full and boarding.  Discussed this with the patient and he agrees that being admitted to [facility] is acceptable to him.  The patient's chest x-ray reviewed which shows pleural effusion.  No clinical suspicion for pneumonia is so withheld antibiotics at this time.  Feel that diuresis would be more beneficial. Will speak with Cardiology given patient's EF < 20%.   Discussed patient's case with Cardiology, Dr. Radford Pax to request admission. Patient and family (if present) updated with plan. Care transferred to Cardiology service.  I reviewed all nursing notes, vitals, pertinent old records, EKGs, labs, imaging (as  available).  ____________________________________________  FINAL CLINICAL IMPRESSION(S) / ED DIAGNOSES  Final diagnoses:  Acute on chronic congestive heart failure, unspecified heart failure type ([place])     MEDICATIONS GIVEN DURING THIS VISIT:  Medications  furosemide (LASIX) injection 40 mg (40 mg Intravenous Given 05/03/18 1711)  alum & mag hydroxide-simeth (MAALOX/MYLANTA) 200-200-20 MG/5ML suspension 30 mL (30 mLs Oral Given 05/03/18 1946)    Note:  This document was prepared using Dragon voice recognition software and may include unintentional dictation errors.  Nanda Quinton, MD Emergency Medicine    Harli Engelken, [facility], MD 05/03/18 2154

## 2018-05-03 NOTE — ED Triage Notes (Signed)
SOB x 3 months. He is on home oxygen. Swelling in his legs and feet since last week. His MD have him some "pills" that did not help the symptoms.

## 2018-05-03 NOTE — ED Notes (Signed)
Pt on monitor 

## 2018-05-04 DIAGNOSIS — Z86718 Personal history of other venous thrombosis and embolism: Secondary | ICD-10-CM | POA: Diagnosis not present

## 2018-05-04 DIAGNOSIS — I130 Hypertensive heart and chronic kidney disease with heart failure and stage 1 through stage 4 chronic kidney disease, or unspecified chronic kidney disease: Secondary | ICD-10-CM | POA: Diagnosis not present

## 2018-05-04 DIAGNOSIS — Z9981 Dependence on supplemental oxygen: Secondary | ICD-10-CM | POA: Diagnosis not present

## 2018-05-04 DIAGNOSIS — I509 Heart failure, unspecified: Secondary | ICD-10-CM

## 2018-05-04 DIAGNOSIS — Z7901 Long term (current) use of anticoagulants: Secondary | ICD-10-CM | POA: Diagnosis not present

## 2018-05-04 DIAGNOSIS — J449 Chronic obstructive pulmonary disease, unspecified: Secondary | ICD-10-CM | POA: Diagnosis not present

## 2018-05-04 DIAGNOSIS — N184 Chronic kidney disease, stage 4 (severe): Secondary | ICD-10-CM | POA: Diagnosis not present

## 2018-05-04 DIAGNOSIS — R0602 Shortness of breath: Secondary | ICD-10-CM

## 2018-05-04 DIAGNOSIS — N179 Acute kidney failure, unspecified: Secondary | ICD-10-CM

## 2018-05-04 DIAGNOSIS — Z8679 Personal history of other diseases of the circulatory system: Secondary | ICD-10-CM | POA: Diagnosis not present

## 2018-05-04 DIAGNOSIS — I2510 Atherosclerotic heart disease of native coronary artery without angina pectoris: Secondary | ICD-10-CM | POA: Diagnosis not present

## 2018-05-04 DIAGNOSIS — E1122 Type 2 diabetes mellitus with diabetic chronic kidney disease: Secondary | ICD-10-CM | POA: Diagnosis not present

## 2018-05-04 DIAGNOSIS — Z7982 Long term (current) use of aspirin: Secondary | ICD-10-CM | POA: Diagnosis not present

## 2018-05-04 DIAGNOSIS — Z85528 Personal history of other malignant neoplasm of kidney: Secondary | ICD-10-CM | POA: Diagnosis not present

## 2018-05-04 DIAGNOSIS — Z95810 Presence of automatic (implantable) cardiac defibrillator: Secondary | ICD-10-CM | POA: Diagnosis not present

## 2018-05-04 DIAGNOSIS — Z87891 Personal history of nicotine dependence: Secondary | ICD-10-CM | POA: Diagnosis not present

## 2018-05-04 DIAGNOSIS — I5023 Acute on chronic systolic (congestive) heart failure: Secondary | ICD-10-CM

## 2018-05-04 DIAGNOSIS — Z79899 Other long term (current) drug therapy: Secondary | ICD-10-CM | POA: Diagnosis not present

## 2018-05-04 DIAGNOSIS — I428 Other cardiomyopathies: Secondary | ICD-10-CM | POA: Diagnosis not present

## 2018-05-04 DIAGNOSIS — I513 Intracardiac thrombosis, not elsewhere classified: Secondary | ICD-10-CM

## 2018-05-04 DIAGNOSIS — E119 Type 2 diabetes mellitus without complications: Secondary | ICD-10-CM

## 2018-05-04 DIAGNOSIS — K219 Gastro-esophageal reflux disease without esophagitis: Secondary | ICD-10-CM | POA: Diagnosis not present

## 2018-05-04 DIAGNOSIS — N189 Chronic kidney disease, unspecified: Secondary | ICD-10-CM

## 2018-05-04 LAB — CBC WITH DIFFERENTIAL/PLATELET
Abs Immature Granulocytes: 0 10*3/uL (ref 0.0–0.1)
Basophils Absolute: 0 10*3/uL (ref 0.0–0.1)
Basophils Relative: 1 %
Eosinophils Absolute: 0 10*3/uL (ref 0.0–0.7)
Eosinophils Relative: 1 %
HCT: 37.3 % — ABNORMAL LOW (ref 39.0–52.0)
Hemoglobin: 12 g/dL — ABNORMAL LOW (ref 13.0–17.0)
Immature Granulocytes: 0 %
Lymphocytes Relative: 31 %
Lymphs Abs: 1.4 10*3/uL (ref 0.7–4.0)
MCH: 22.9 pg — ABNORMAL LOW (ref 26.0–34.0)
MCHC: 32.2 g/dL (ref 30.0–36.0)
MCV: 71.2 fL — ABNORMAL LOW (ref 78.0–100.0)
Monocytes Absolute: 0.6 10*3/uL (ref 0.1–1.0)
Monocytes Relative: 13 %
Neutro Abs: 2.4 10*3/uL (ref 1.7–7.7)
Neutrophils Relative %: 54 %
Platelets: 191 10*3/uL (ref 150–400)
RBC: 5.24 MIL/uL (ref 4.22–5.81)
RDW: 17.8 % — ABNORMAL HIGH (ref 11.5–15.5)
WBC: 4.4 10*3/uL (ref 4.0–10.5)

## 2018-05-04 LAB — BASIC METABOLIC PANEL
Anion gap: 10 (ref 5–15)
BUN: 39 mg/dL — ABNORMAL HIGH (ref 6–20)
CO2: 27 mmol/L (ref 22–32)
Calcium: 8.9 mg/dL (ref 8.9–10.3)
Chloride: 103 mmol/L (ref 101–111)
Creatinine, Ser: 2.42 mg/dL — ABNORMAL HIGH (ref 0.61–1.24)
GFR calc Af Amer: 28 mL/min — ABNORMAL LOW (ref >60)
GFR calc non Af Amer: 24 mL/min — ABNORMAL LOW (ref >60)
Glucose, Bld: 98 mg/dL (ref 65–99)
Potassium: 4.3 mmol/L (ref 3.5–5.1)
Sodium: 140 mmol/L (ref 135–145)

## 2018-05-04 LAB — GLUCOSE, CAPILLARY
GLUCOSE-CAPILLARY: 114 mg/dL — AB (ref 65–99)
GLUCOSE-CAPILLARY: 137 mg/dL — AB (ref 65–99)
Glucose-Capillary: 103 mg/dL — ABNORMAL HIGH (ref 65–99)
Glucose-Capillary: 157 mg/dL — ABNORMAL HIGH (ref 65–99)

## 2018-05-04 LAB — PROTIME-INR
INR: 2.5
Prothrombin Time: 26.8 seconds — ABNORMAL HIGH (ref 11.4–15.2)

## 2018-05-04 MED ORDER — PANTOPRAZOLE SODIUM 40 MG PO TBEC
40.0000 mg | DELAYED_RELEASE_TABLET | Freq: Every day | ORAL | Status: DC
  Administered 2018-05-04 – 2018-05-05 (×2): 40 mg via ORAL
  Filled 2018-05-04 (×2): qty 1

## 2018-05-04 MED ORDER — FUROSEMIDE 10 MG/ML IJ SOLN
40.0000 mg | Freq: Two times a day (BID) | INTRAMUSCULAR | Status: DC
  Administered 2018-05-04 – 2018-05-05 (×3): 40 mg via INTRAVENOUS
  Filled 2018-05-04 (×3): qty 4

## 2018-05-04 MED ORDER — AMIODARONE HCL 200 MG PO TABS: 200.0000 mg | ORAL_TABLET | Freq: Every day | ORAL | Status: DC

## 2018-05-04 MED ORDER — CARVEDILOL 3.125 MG PO TABS
3.1250 mg | ORAL_TABLET | Freq: Two times a day (BID) | ORAL | Status: DC
  Administered 2018-05-04 – 2018-05-05 (×3): 3.125 mg via ORAL
  Filled 2018-05-04 (×3): qty 1

## 2018-05-04 MED ORDER — WARFARIN - PHYSICIAN DOSING INPATIENT: Freq: Every day | Status: DC

## 2018-05-04 MED ORDER — TAMSULOSIN HCL 0.4 MG PO CAPS
0.4000 mg | ORAL_CAPSULE | Freq: Every day | ORAL | Status: DC
  Administered 2018-05-04 – 2018-05-05 (×2): 0.4 mg via ORAL
  Filled 2018-05-04 (×2): qty 1

## 2018-05-04 MED ORDER — MOMETASONE FURO-FORMOTEROL FUM 200-5 MCG/ACT IN AERO
2.0000 | INHALATION_SPRAY | Freq: Two times a day (BID) | RESPIRATORY_TRACT | Status: DC
  Administered 2018-05-04 – 2018-05-05 (×3): 2 via RESPIRATORY_TRACT
  Filled 2018-05-04: qty 8.8

## 2018-05-04 MED ORDER — WARFARIN SODIUM 2.5 MG PO TABS
2.5000 mg | ORAL_TABLET | Freq: Every day | ORAL | Status: DC
  Administered 2018-05-04 – 2018-05-05 (×2): 2.5 mg via ORAL
  Filled 2018-05-04 (×2): qty 1

## 2018-05-04 MED ORDER — SPIRONOLACTONE 12.5 MG HALF TABLET: 12.5000 mg | ORAL_TABLET | Freq: Every day | ORAL | Status: DC

## 2018-05-04 NOTE — H&P (Signed)
Cardiology History & Physical    Patient ID: Spencer Rice MRN: [id number], DOB: [birth year] Date of Encounter: 05/04/2018, 12:33 AM Primary Physician: [name]., MD  Chief Complaint: SOB, LE edema   HPI: Spencer Rice is a 77 y.o. 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  The patient has felt increasingly short of breath and has had worsening lower extremity edema over the last several days.  Fortunately, he notes that his weights are relatively stable, approximately in the range of 203 to 206 pounds.  This is not been increasing recently.  He is taking 30 mg of torsemide daily, with only modest diuretic effect.  From a heart failure standpoint, he has been taking carvedilol, but his ACE inhibitor and Spironolactone were recently discontinued due to worsening renal function.  Otherwise, he denies chest pain, palpitations, ICD shocks, fevers, chills, or other localizing symptoms.  He presented to [place] today with these symptoms.  And there his labs were notable for creatinine of 2.42 (which has been increasing over the past several months from 1.3 in November 2018 to 2.1 in March 2019).  He had a grossly elevated BNP.  His chest x-ray was notable for small right pleural effusion and right basilar airspace opacity, consistent with either atelectasis or pneumonia.  He was given 40 mg of IV Lasix with excellent diuretic effect and transferred to [facility] for further management.  Past Medical History:  Diagnosis Date  . AICD (automatic cardioverter/defibrillator) present   . Cancer of left kidney ([place])    S/P OR 05/2014  . CHF (congestive heart failure) ([place])   . Coronary artery disease   . DVT (deep venous  thrombosis) ([facility]) 1983   LLE  . GERD (gastroesophageal reflux disease)   . History of hiatal hernia   . Hypertension      Surgical History:  Past Surgical History:  Procedure Laterality Date  . CARDIAC CATHETERIZATION  1980's  . IMPLANTABLE CARDIOVERTER DEFIBRILLATOR IMPLANT  07/21/2010  . IR GENERIC HISTORICAL  12/23/2014   IR RADIOLOGIST EVAL & MGMT 12/23/2014 [name], MD [facility] INTERV RAD  . IR GENERIC HISTORICAL  07/20/2016   IR RADIOLOGIST EVAL & MGMT 07/20/2016 [facility] INTERV RAD  . IR RADIOLOGIST EVAL & MGMT  07/04/2017  . RENAL CRYOABLATION Left 05/2014     Home Meds: Prior to Admission medications   Medication Sig Start Date End Date Taking? Authorizing Provider  amiodarone (PACERONE) 200 MG tablet Take 200 mg by mouth daily.   Yes [provider]  aspirin 81 MG chewable tablet Chew 81 mg by mouth.   Yes [provider]  carvedilol (COREG) 12.5 MG tablet Take 12.5 mg by mouth 2 (two) times daily with a meal.    Yes [provider]  esomeprazole (NEXIUM) 20 MG capsule Take 20 mg by mouth daily as needed (FOR HEARTBURN).   Yes [provider]  omeprazole (PRILOSEC) 20 MG capsule Take 20 mg by mouth daily.   Yes [provider]  spironolactone (ALDACTONE) 25 MG tablet Take 12.5 mg by mouth daily.   Yes [provider]  tamsulosin (FLOMAX) 0.4 MG CAPS capsule Take 0.4 mg by mouth.   Yes [provider]  torsemide (DEMADEX) 20 MG tablet Take  30 mg by mouth daily. Take 0.5 tablet daily    Yes [provider]  warfarin (COUMADIN) 5 MG tablet Take 2.5 mg by mouth daily. Take 2.5 mg daily except 5 mg Thursday   Yes [provider]  benzonatate (TESSALON) 100 MG capsule Take 1 capsule (100 mg total) by mouth every 8 (eight) hours. 08/15/17   Palumbo, April, MD  budesonide-formoterol [facility]) 160-4.5 MCG/ACT inhaler Inhale into the lungs. 09/27/17   [provider]  Fluticasone-Salmeterol (ADVAIR  HFA IN) Inhale into the lungs.    [provider]  JARDIANCE 10 MG TABS tablet Take 10 mg by mouth daily. 03/06/18   [provider]    Allergies: No Known Allergies  Social History   Socioeconomic History  . Marital status: Married    Spouse name: Not on file  . Number of children: Not on file  . Years of education: Not on file  . Highest education level: Not on file  Occupational History  . Not on file  Social Needs  . Financial resource strain: Not on file  . Food insecurity:    Worry: Not on file    Inability: Not on file  . Transportation needs:    Medical: Not on file    Non-medical: Not on file  Tobacco Use  . Smoking status: Former Smoker    Packs/day: 0.75    Years: 17.00    Pack years: 12.75    Types: Cigarettes    Start date: 05/08/1959    Last attempt to quit: 06/20/1976    Years since quitting: 41.8  . Smokeless tobacco: Never Used  Substance and Sexual Activity  . Alcohol use: No  . Drug use: No  . Sexual activity: Not on file  Lifestyle  . Physical activity:    Days per week: Not on file    Minutes per session: Not on file  . Stress: Not on file  Relationships  . Social connections:    Talks on phone: Not on file    Gets together: Not on file    Attends religious service: Not on file    Active member of club or organization: Not on file    Attends meetings of clubs or organizations: Not on file    Relationship status: Not on file  . Intimate partner violence:    Fear of current or ex partner: Not on file    Emotionally abused: Not on file    Physically abused: Not on file    Forced sexual activity: Not on file  Other Topics Concern  . Not on file  Social History Narrative  . Not on file     Family History  Problem Relation Age of Onset  . Heart disease Father   . Heart attack Father 63  . Hypertension Father   . Hypertension Mother     Review of Systems: All other systems reviewed and are otherwise negative except as  noted above.  Labs:   Lab Results  Component Value Date   WBC 4.2 05/03/2018   HGB 13.1 05/03/2018   HCT 38.6 (L) 05/03/2018   MCV 70.1 (L) 05/03/2018   PLT 194 05/03/2018    Recent Labs  Lab 05/03/18 1523  NA 137  K 4.4  CL 100*  CO2 26  BUN 41*  CREATININE 2.42*  CALCIUM 8.9  PROT 6.9  BILITOT 1.9*  ALKPHOS 79  ALT 24  AST 28  GLUCOSE 120*   Recent Labs  05/03/18 1523  TROPONINI 0.05*   No results found for: CHOL, HDL, LDLCALC, TRIG Lab Results  Component Value Date   DDIMER 0.37 05/09/2015    Radiology/Studies:  Dg Chest 2 View  Result Date: 05/03/2018 CLINICAL DATA:  Shortness of breath for 3 months with lower extremity edema. History of smoking and CHF. EXAM: CHEST - 2 VIEW COMPARISON:  03/29/2018 FINDINGS: An ICD remains in place. The cardiac silhouette remains moderately to severely enlarged. Aortic atherosclerosis is noted. There is a new small right pleural effusion with patchy airspace opacity in the right lung base. The left lung is clear. No pneumothorax is identified. No acute osseous abnormality is seen. IMPRESSION: New small right pleural effusion and right basilar airspace opacity which could reflect pneumonia or atelectasis. Electronically Signed   By: Logan Bores M.D.   On: 05/03/2018 16:39   Wt Readings from Last 3 Encounters:  05/03/18 92.6 kg (204 lb 1.6 oz)  02/26/18 93.9 kg (207 lb)  01/18/18 97.5 kg (215 lb)    EKG: AV sequential pacing with some undersensing.  Physical Exam: Blood pressure 96/72, pulse 73, temperature 98.2 F (36.8 C), temperature source Oral, resp. rate 18, height 6\' 1"  (1.854 m), weight 92.6 kg (204 lb 1.6 oz), SpO2 100 %. Body mass index is 26.93 kg/m. General: Well developed, well nourished, in no acute distress. Head: Normocephalic, atraumatic, sclera non-icteric, no xanthomas, nares are without discharge.  Neck: Negative for carotid bruits.  JVP 9-10.  Prominent EJ. Lungs:   Faint inspiratory crackles at  the right base, otherwise clear to auscultation. Heart: RRR with S1 S2. No murmurs, rubs, or gallops appreciated. Abdomen: Soft, non-tender, non-distended with normoactive bowel sounds. No hepatomegaly. No rebound/guarding. No obvious abdominal masses. Msk:  Strength and tone appear normal for age. Extremities: No clubbing or cyanosis. No edema.  Distal pedal pulses are 2+ and equal bilaterally. Neuro: Alert and oriented X 3. No focal deficit. No facial asymmetry. Moves all extremities spontaneously. Psych:  Responds to questions appropriately with a normal affect.    Assessment and Plan  77 year old man with significant mixed cardiomyopathy, who presents with shortness of breath and lower extremity edema.  1.  Acute on chronic heart failure: His extremities are largely warm, and while he is mildly hypervolemic his volume status is under reasonable control.  Unfortunately, his deteriorating kidney function precludes use of an ACE inhibitor or Spironolactone at this point.  We will hold carvedilol for now given acute decompensation (albeit mild) and profoundly low EF, however, this can likely be restarted in the a.m. Minimal utility to repeating an echo at this point.  Can consider re-dosing IV Lasix in the morning based on volume status exam.  He is continuing to put out well from the 40 mg of IV Lasix received at [facility]  2.  Shortness of breath: Likely related to volume status.  No infectious symptoms and normal WBC make infection less likely.  Moving reasonable air on exam without significant wheezing, and thus minimal concern for COPD exacerbation are present.  3.  Acute on chronic kidney injury: Renal function has been to deteriorating over the past several months.  He is planning to see his outpatient nephrologist given his prior renal cell carcinoma, and possibility of recurrence getting worse and normal kidney function.  Plan for diuresis as above.  4.  COPD: Continue home  inhaler regimen, and home oxygen.  No evidence of exacerbation on exam at present.  5.  Diabetes: Holding  home oral agents at present.  6.  History of apical thrombus and DVT: Continue home Coumadin.  Patient is therapeutic upon presentation with an INR of 2.6.  Signed, Doylene Canning, MD 05/04/2018, 12:33 AM

## 2018-05-04 NOTE — Progress Notes (Signed)
Patient was a transfer from [facility]. Patient was stable and ambulatory and oriented to the unit. Waiting for patient to be transferred in the computer to view patients orders. Will continue to monitor.   Drue Flirt, RN

## 2018-05-04 NOTE — Progress Notes (Signed)
Progress Note  Patient Name: Spencer Rice Date of Encounter: 05/04/2018  Primary Cardiologist: Minna Merritts  Subjective   Was feeling well when he woke up but just had a "spell" of shortness of breath. Legs are less swollen.  Inpatient Medications    Scheduled Meds: . aspirin EC  81 mg Oral Daily  . mometasone-formoterol  2 puff Inhalation BID  . pantoprazole  40 mg Oral Daily  . sodium chloride flush  3 mL Intravenous Q12H  . tamsulosin  0.4 mg Oral Daily  . warfarin  2.5 mg Oral Daily  . Warfarin - Physician Dosing Inpatient   Does not apply q1800   Continuous Infusions: . sodium chloride     PRN Meds: sodium chloride, acetaminophen, ondansetron (ZOFRAN) IV, sodium chloride flush   Vital Signs    Vitals:   05/03/18 2137 05/03/18 2140 05/04/18 0350 05/04/18 0900  BP: (!) 80/68 96/72 103/78   Pulse: 73  70   Resp: 18  18   Temp:  98.2 F (36.8 C) 98.4 F (36.9 C)   TempSrc:  Oral Oral   SpO2: 100%  100% 100%  Weight:  204 lb 1.6 oz (92.6 kg) 204 lb (92.5 kg)   Height:  6\' 1"  (1.854 m)      Intake/Output Summary (Last 24 hours) at 05/04/2018 1057 Last data filed at 05/04/2018 0900 Gross per 24 hour  Intake 243 ml  Output 875 ml  Net -632 ml   Filed Weights   05/03/18 1449 05/03/18 2140 05/04/18 0350  Weight: 205 lb (93 kg) 204 lb 1.6 oz (92.6 kg) 204 lb (92.5 kg)    Telemetry    AV paced rhythm - Personally Reviewed   Physical Exam   GEN: No acute distress.   Neck: No JVD Cardiac: RRR, no murmurs, rubs, or gallops.  Respiratory: Clear to auscultation bilaterally. GI: Soft, nontender, non-distended  MS: Mild dorsal pedal edema b/l; No deformity. Neuro:  Nonfocal  Psych: Normal affect   Labs    Chemistry Recent Labs  Lab 05/03/18 1523 05/04/18 0715  NA 137 140  K 4.4 4.3  CL 100* 103  CO2 26 27  GLUCOSE 120* 98  BUN 41* 39*  CREATININE 2.42* 2.42*  CALCIUM 8.9 8.9  PROT 6.9  --   ALBUMIN 3.6  --   AST 28  --   ALT 24  --   ALKPHOS 79   --   BILITOT 1.9*  --   GFRNONAA 24* 24*  GFRAA 28* 28*  ANIONGAP 11 10     Hematology Recent Labs  Lab 05/03/18 1523 05/04/18 0715  WBC 4.2 4.4  RBC 5.51 5.24  HGB 13.1 12.0*  HCT 38.6* 37.3*  MCV 70.1* 71.2*  MCH 23.8* 22.9*  MCHC 33.9 32.2  RDW 18.4* 17.8*  PLT 194 191    Cardiac Enzymes Recent Labs  Lab 05/03/18 1523  TROPONINI 0.05*   No results for input(s): TROPIPOC in the last 168 hours.   BNP Recent Labs  Lab 05/03/18 1523  BNP >4,500.0*     DDimer No results for input(s): DDIMER in the last 168 hours.   Radiology    Dg Chest 2 View  Result Date: 05/03/2018 CLINICAL DATA:  Shortness of breath for 3 months with lower extremity edema. History of smoking and CHF. EXAM: CHEST - 2 VIEW COMPARISON:  03/29/2018 FINDINGS: An ICD remains in place. The cardiac silhouette remains moderately to severely enlarged. Aortic atherosclerosis is noted. There is a new small  right pleural effusion with patchy airspace opacity in the right lung base. The left lung is clear. No pneumothorax is identified. No acute osseous abnormality is seen. IMPRESSION: New small right pleural effusion and right basilar airspace opacity which could reflect pneumonia or atelectasis. Electronically Signed   By: Logan Bores M.D.   On: 05/03/2018 16:39    Cardiac Studies   Echo (03/28/18):  Conclusions  Summary  Images obtained for adjusting rhythm and function.  LV is severely dilated, LVEF < 20%   Patient Profile     77 y.o. 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   Assessment & Plan    1.  Acute on chronic heart failure: Volume status reasonable. 772 cc output thus far.  Unfortunately, his deteriorating kidney function  precludes use of an ACE inhibitor or Spironolactone at this point.   I will resume Coreg at lower dose of 3.125 mg bid and increase as BP tolerates (taking 12.5 mg bid at home). I will give IV Lasix 40 mg bid today and reassess on 6/2.  2.  Shortness of breath: Likely related to volume status.  No infectious symptoms and normal WBC make infection less likely.  Moving reasonable air on exam without significant wheezing, and thus minimal concern for COPD exacerbation are present.  3.  Acute on chronic kidney injury: Renal function has been to deteriorating over the past several months.   BUN 39, creatinine 2.42 today. He is planning to see his outpatient nephrologist given his prior renal cell carcinoma, and possibility of recurrence getting worse and normal kidney function.  Plan for diuresis as above.  4.  COPD: Continue home inhaler regimen, and home oxygen.  No evidence of exacerbation on exam at present.  5.  Diabetes: Holding home oral agents at present.  6.  History of apical thrombus and DVT: Continue home Coumadin.  Patient is therapeutic upon presentation with an INR of 2.5 today.     For questions or updates, please contact [place] Please consult [email or web address] for contact info under Cardiology/STEMI.      Signed, Kate Sable, MD  05/04/2018, 10:57 AM

## 2018-05-04 NOTE — Progress Notes (Signed)
Pt is stable, vitals stable, ambulated in a hall and room, in oxygen @2l /min, denies CP and SOB, will continue to monitor the patient  Kelie Gainey,RN

## 2018-05-05 DIAGNOSIS — E119 Type 2 diabetes mellitus without complications: Secondary | ICD-10-CM | POA: Diagnosis not present

## 2018-05-05 DIAGNOSIS — I513 Intracardiac thrombosis, not elsewhere classified: Secondary | ICD-10-CM | POA: Diagnosis not present

## 2018-05-05 DIAGNOSIS — I509 Heart failure, unspecified: Secondary | ICD-10-CM | POA: Diagnosis not present

## 2018-05-05 DIAGNOSIS — I5043 Acute on chronic combined systolic (congestive) and diastolic (congestive) heart failure: Secondary | ICD-10-CM | POA: Diagnosis not present

## 2018-05-05 DIAGNOSIS — N179 Acute kidney failure, unspecified: Secondary | ICD-10-CM | POA: Diagnosis not present

## 2018-05-05 DIAGNOSIS — R0602 Shortness of breath: Secondary | ICD-10-CM | POA: Diagnosis not present

## 2018-05-05 DIAGNOSIS — I130 Hypertensive heart and chronic kidney disease with heart failure and stage 1 through stage 4 chronic kidney disease, or unspecified chronic kidney disease: Secondary | ICD-10-CM | POA: Diagnosis not present

## 2018-05-05 DIAGNOSIS — J449 Chronic obstructive pulmonary disease, unspecified: Secondary | ICD-10-CM | POA: Diagnosis not present

## 2018-05-05 DIAGNOSIS — N189 Chronic kidney disease, unspecified: Secondary | ICD-10-CM | POA: Diagnosis not present

## 2018-05-05 LAB — BASIC METABOLIC PANEL
Anion gap: 11 (ref 5–15)
BUN: 40 mg/dL — ABNORMAL HIGH (ref 6–20)
CO2: 26 mmol/L (ref 22–32)
Calcium: 8.7 mg/dL — ABNORMAL LOW (ref 8.9–10.3)
Chloride: 101 mmol/L (ref 101–111)
Creatinine, Ser: 2.3 mg/dL — ABNORMAL HIGH (ref 0.61–1.24)
GFR calc Af Amer: 30 mL/min — ABNORMAL LOW (ref >60)
GFR calc non Af Amer: 26 mL/min — ABNORMAL LOW (ref >60)
Glucose, Bld: 101 mg/dL — ABNORMAL HIGH (ref 65–99)
Potassium: 3.9 mmol/L (ref 3.5–5.1)
Sodium: 138 mmol/L (ref 135–145)

## 2018-05-05 LAB — GLUCOSE, CAPILLARY: GLUCOSE-CAPILLARY: 108 mg/dL — AB (ref 65–99)

## 2018-05-05 LAB — PROTIME-INR
INR: 2.74
Prothrombin Time: 28.8 seconds — ABNORMAL HIGH (ref 11.4–15.2)

## 2018-05-05 MED ORDER — TORSEMIDE 20 MG PO TABS: ORAL_TABLET | ORAL | 1 refills | Status: DC

## 2018-05-05 MED ORDER — CARVEDILOL 3.125 MG PO TABS: 3.1250 mg | ORAL_TABLET | Freq: Two times a day (BID) | ORAL | 1 refills | Status: DC

## 2018-05-05 NOTE — Progress Notes (Signed)
Progress Note  Patient Name: Spencer Rice Date of Encounter: 05/05/2018  Primary Cardiologist: Dr. Minna Merritts  Subjective   Feeling much better. Dorsal pedal edema has resolved. Put out an additional 705 cc in last 24 hours. Takes torsemide 30 mg at home daily.  Inpatient Medications    Scheduled Meds: . aspirin EC  81 mg Oral Daily  . carvedilol  3.125 mg Oral BID WC  . furosemide  40 mg Intravenous BID  . mometasone-formoterol  2 puff Inhalation BID  . pantoprazole  40 mg Oral Daily  . sodium chloride flush  3 mL Intravenous Q12H  . tamsulosin  0.4 mg Oral Daily  . warfarin  2.5 mg Oral Daily  . Warfarin - Physician Dosing Inpatient   Does not apply q1800   Continuous Infusions: . sodium chloride     PRN Meds: sodium chloride, acetaminophen, ondansetron (ZOFRAN) IV, sodium chloride flush   Vital Signs    Vitals:   05/04/18 2030 05/04/18 2114 05/04/18 2354 05/05/18 0618  BP: 98/72  98/70 106/80  Pulse: 69 71 70 70  Resp:  18 18 19   Temp: 97.6 F (36.4 C)  97.8 F (36.6 C) 98.2 F (36.8 C)  TempSrc: Oral  Oral Oral  SpO2: 100% 100% 100% 100%  Weight:    202 lb 1.6 oz (91.7 kg)  Height:        Intake/Output Summary (Last 24 hours) at 05/05/2018 0954 Last data filed at 05/05/2018 0600 Gross per 24 hour  Intake 580 ml  Output 1425 ml  Net -845 ml   Filed Weights   05/03/18 2140 05/04/18 0350 05/05/18 0618  Weight: 204 lb 1.6 oz (92.6 kg) 204 lb (92.5 kg) 202 lb 1.6 oz (91.7 kg)    Telemetry    AV paced rhythm, rare PVC's - Personally Reviewed   Physical Exam   GEN: No acute distress.   Neck: No JVD Cardiac: RRR, no murmurs, rubs, or gallops.  Respiratory: Clear to auscultation bilaterally. GI: Soft, nontender, non-distended  MS: No edema; No deformity. Neuro:  Nonfocal  Psych: Normal affect   Labs    Chemistry Recent Labs  Lab 05/03/18 1523 05/04/18 0715 05/05/18 0702  NA 137 140 138  K 4.4 4.3 3.9  CL 100* 103 101  CO2 26 27 26     GLUCOSE 120* 98 101*  BUN 41* 39* 40*  CREATININE 2.42* 2.42* 2.30*  CALCIUM 8.9 8.9 8.7*  PROT 6.9  --   --   ALBUMIN 3.6  --   --   AST 28  --   --   ALT 24  --   --   ALKPHOS 79  --   --   BILITOT 1.9*  --   --   GFRNONAA 24* 24* 26*  GFRAA 28* 28* 30*  ANIONGAP 11 10 11      Hematology Recent Labs  Lab 05/03/18 1523 05/04/18 0715  WBC 4.2 4.4  RBC 5.51 5.24  HGB 13.1 12.0*  HCT 38.6* 37.3*  MCV 70.1* 71.2*  MCH 23.8* 22.9*  MCHC 33.9 32.2  RDW 18.4* 17.8*  PLT 194 191    Cardiac Enzymes Recent Labs  Lab 05/03/18 1523  TROPONINI 0.05*   No results for input(s): TROPIPOC in the last 168 hours.   BNP Recent Labs  Lab 05/03/18 1523  BNP >4,500.0*     DDimer No results for input(s): DDIMER in the last 168 hours.   Radiology    Dg Chest 2 View  Result Date: 05/03/2018 CLINICAL DATA:  Shortness of breath for 3 months with lower extremity edema. History of smoking and CHF. EXAM: CHEST - 2 VIEW COMPARISON:  03/29/2018 FINDINGS: An ICD remains in place. The cardiac silhouette remains moderately to severely enlarged. Aortic atherosclerosis is noted. There is a new small right pleural effusion with patchy airspace opacity in the right lung base. The left lung is clear. No pneumothorax is identified. No acute osseous abnormality is seen. IMPRESSION: New small right pleural effusion and right basilar airspace opacity which could reflect pneumonia or atelectasis. Electronically Signed   By: Logan Bores M.D.   On: 05/03/2018 16:39    Cardiac Studies   Echo (03/28/18):  Conclusions  Summary  Images obtained for adjusting rhythm and function.  LV is severely dilated, LVEF < 20%   Patient Profile     77 y.o. 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   Assessment & Plan    1. Acute on chronic combined heart failure: Volume status reasonable. 705 cc output additional output in last 24 hrs.  Wt down to 202 lbs (dry weight at home 204-208 lbs). Unfortunately, his deteriorating kidney function precludes use of an ACE inhibitor or Spironolactone at this point.  I will continue Coreg at lower dose of 3.125 mg bid. This can be increased as BP tolerates (taking 12.5 mg bid at home). I will increase torsemide to 30 mg q am and 20 mg q pm. He has an appt with his cardiologist tomorrow.  2. Shortness of breath: Likely related to volume status. No infectious symptoms and normal WBC make infection less likely. Moving reasonable air on exam without significant wheezing, and thus minimal concern for COPD exacerbation are present.  3. Acute on chronic kidney injury: Renal function has been to deteriorating over the past several months.  BUN 40, creatinine 2.30 today (2.42 on 6/1). He is planning to see his outpatient nephrologist given his prior renal cell carcinoma, and possibility of recurrence getting worse and normal kidney function. Plan for diuresis as above.  4. COPD: Continue home inhaler regimen, and home oxygen. No evidence of exacerbation on exam at present.  5. Diabetes: Holding home oral agents at present.  6. History of apical thrombus and DVT: Continue home Coumadin. Patient is therapeutic upon presentation with an INR of 2.74 today.  Disposition: Discharge to home today. Has appt with cardiologist tomorrow.   For questions or updates, please contact [place] Please consult [email or web address] for contact info under Cardiology/STEMI.      Signed, Kate Sable, MD  05/05/2018, 9:54 AM

## 2018-05-05 NOTE — Discharge Summary (Signed)
Discharge Summary    Patient ID: Spencer Rice,  MRN: [id number], DOB/AGE: 77-Jul-1942 77 y.o.  Admit date: 05/03/2018 Discharge date: 05/05/2018  Primary Care Provider: [name]. Primary Cardiologist: Dr. Minna Merritts  Discharge Diagnoses    Active Problems:   CHF (congestive heart failure) (HCC)   Acute on chronic systolic (congestive) heart failure (HCC)   AonCKD stage III-IV   Hx of apical thrombus and DVT  COPD   DM  Allergies No Known Allergies  Diagnostic Studies/Procedures   None this admission    History of Present Illness     77 y.o. 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  The patient has felt increasingly short of breath and has had worsening lower extremity edema over the last several days.  Fortunately, he noted that his weights are relatively stable, approximately in the range of 203 to 206 pounds.  This is not been increasing recently.  He was taking 30 mg of torsemide daily, with only modest diuretic effect.  From a heart failure standpoint, he has been taking carvedilol, but his ACE inhibitor and Spironolactone were recently discontinued due to worsening renal function.  Otherwise, he denies chest pain, palpitations, ICD shocks, fevers, chills, or other localizing symptoms.  He presented to [facility] with these symptoms.]  His labs were notable for creatinine of 2.42 (which has been increasing over the past several months from 1.3 in November 2018 to 2.1 in March 2019).  He had a grossly elevated BNP.  His chest x-ray was notable for small right pleural effusion and right basilar airspace opacity, consistent with either atelectasis or pneumonia.  He was given 40 mg of IV Lasix with  excellent diuretic effect and transferred to [facility] for further management.    Hospital Course     Consultants: None  1. Acute on chronic combined heart failure: -Diurese total 1.8 L.  His weight down 3 pound (205->202lb).  (dry weight at home 204-208 lbs). - Unfortunately, his deteriorating kidney function precludes use of an ACE inhibitor or Spironolactone at this point.Renal function stable here 2.42>>2.42>>2.3.  - Continue Coreg at lower dose of 3.125 mg bid. This can be increased as BP tolerates (taking 12.5 mg bid at home). Increase torsemide to 30 mg q am and 20 mg q pm. He has an appt with his cardiologist tomorrow.  2. Shortness of breath - Likely related to volume status. No infectious symptoms and normal WBC make infection less likely. - Moving reasonable air on exam without significant wheezing, and thus minimal concern for COPD exacerbation are present.  3. Acute on chronic kidney injury: -  Renal function has been to deteriorating over the past several months.stable here as above.  - He is planning to see his outpatient nephrologist given his prior renal cell carcinoma, and possibility of recurrence getting worse and normal kidney function. Plan for diuresis as above  4.  History of apical thrombus and DVT: Continue home Coumadin. Patient is therapeutic upon presentation with an INR of 2.74 at discharge.   The patient has been seen by Dr. Bronson Ing today and deemed ready for discharge home. All follow-up appointments have been scheduled. Discharge medications are listed below.  _____________   Discharge Vitals Blood pressure 106/80, pulse  70, temperature 98.2 F (36.8 C), temperature source Oral, resp. rate 19, height 6\' 1"  (1.854 m), weight 202 lb 1.6 oz (91.7 kg), SpO2 100 %.  Filed Weights   05/03/18 2140 05/04/18 0350 05/05/18 0618  Weight: 204 lb 1.6 oz (92.6 kg) 204 lb (92.5 kg) 202 lb 1.6 oz (91.7 kg)    Labs & Radiologic Studies      CBC Recent Labs    05/03/18 1523 05/04/18 0715  WBC 4.2 4.4  NEUTROABS 2.6 2.4  HGB 13.1 12.0*  HCT 38.6* 37.3*  MCV 70.1* 71.2*  PLT 194 878   Basic Metabolic Panel Recent Labs    05/04/18 0715 05/05/18 0702  NA 140 138  K 4.3 3.9  CL 103 101  CO2 27 26  GLUCOSE 98 101*  BUN 39* 40*  CREATININE 2.42* 2.30*  CALCIUM 8.9 8.7*   Liver Function Tests Recent Labs    05/03/18 1523  AST 28  ALT 24  ALKPHOS 79  BILITOT 1.9*  PROT 6.9  ALBUMIN 3.6   Recent Labs    05/03/18 1523  LIPASE 42   Cardiac Enzymes Recent Labs    05/03/18 1523  TROPONINI 0.05*    Dg Chest 2 View  Result Date: 05/03/2018 CLINICAL DATA:  Shortness of breath for 3 months with lower extremity edema. History of smoking and CHF. EXAM: CHEST - 2 VIEW COMPARISON:  03/29/2018 FINDINGS: An ICD remains in place. The cardiac silhouette remains moderately to severely enlarged. Aortic atherosclerosis is noted. There is a new small right pleural effusion with patchy airspace opacity in the right lung base. The left lung is clear. No pneumothorax is identified. No acute osseous abnormality is seen. IMPRESSION: New small right pleural effusion and right basilar airspace opacity which could reflect pneumonia or atelectasis. Electronically Signed   By: Logan Bores M.D.   On: 05/03/2018 16:39    Disposition   Pt is being discharged home today in good condition.  Follow-up Plans & Appointments    Follow-up Information    [name]., MD Follow up.   Specialty:  Internal Medicine Contact information: [address] [place] [address] [phone]        Dr. Minna Merritts Follow up.   Why:  follow up as schedule tomorrow         Discharge Instructions    Diet - low sodium heart healthy   Complete by:  As directed    Discharge instructions   Complete by:  As directed    Increase torsemide to 30 mg q am and 20 mg q pm. Reduce coreg to 3.125mg  BID Follow up with  regular cardiologist tomorrow.   Increase activity slowly   Complete by:  As directed       Discharge Medications   Allergies as of 05/05/2018   No Known Allergies     Medication List    STOP taking these medications   spironolactone 25 MG tablet Commonly known as:  ALDACTONE     TAKE these medications   ADVAIR HFA IN Inhale into the lungs.   amiodarone 200 MG tablet Commonly known as:  PACERONE Take 200 mg by mouth daily.   aspirin 81 MG chewable tablet Chew 81 mg by mouth.   benzonatate 100 MG capsule Commonly known as:  TESSALON Take 1 capsule (100 mg total) by mouth every 8 (eight) hours.   carvedilol 3.125 MG tablet Commonly known as:  COREG Take 1 tablet (3.125 mg total) by  mouth 2 (two) times daily with a meal. What changed:    medication strength  how much to take   esomeprazole 20 MG capsule Commonly known as:  NEXIUM Take 20 mg by mouth daily as needed (FOR HEARTBURN).   JARDIANCE 10 MG Tabs tablet Generic drug:  empagliflozin Take 10 mg by mouth daily.   omeprazole 20 MG capsule Commonly known as:  PRILOSEC Take 20 mg by mouth daily.   SYMBICORT 160-4.5 MCG/ACT inhaler Generic drug:  budesonide-formoterol Inhale into the lungs.   tamsulosin 0.4 MG Caps capsule Commonly known as:  FLOMAX Take 0.4 mg by mouth.   torsemide 20 MG tablet Commonly known as:  DEMADEX Take 1.5 tablet (30mg ) in AM and 1 table (20mg ) in PM daily What changed:    how much to take  how to take this  when to take this  additional instructions   warfarin 5 MG tablet Commonly known as:  COUMADIN Take 2.5 mg by mouth daily. Take 2.5 mg daily except 5 mg Thursday          Outstanding Labs/Studies   None   Duration of Discharge Encounter   Greater than 30 minutes including physician time.  Signed, Crista Luria Ayomide Purdy PA-C 05/05/2018, 10:27 AM

## 2018-05-05 NOTE — Progress Notes (Signed)
Pt got discharged to home, discharge instructions provided and patient showed understanding to it, IV taken out,Telemonitor DC,pt left unit in wheelchair with all of the belongings accompanied with a family member [employer]ister's clerk)  Spencer Rice

## 2018-05-05 NOTE — Progress Notes (Signed)
OT Cancellation Note  Patient Details Name: Spencer Rice MRN: [id number] DOB: [birth year]   Cancelled Treatment:    Reason Eval/Treat Not Completed: OT screened, no needs identified, will sign off.  Merri Ray Briany Aye 05/05/2018, 10:32 AM  Hulda Humphrey OTR/L [phone]

## 2018-05-05 NOTE — Evaluation (Signed)
Physical Therapy Evaluation Patient Details Name: Spencer Rice MRN: [id number] DOB: 1941/11/20 Today's Date: 05/05/2018   History of Present Illness  77 y.o. 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 Mobilizing well. Educated patient on energy conservation and mobility expectations. No further acute PT needs. Will sign off.  OF NOTE: saturations stable on 2 liters O2 via [place], HR 90s     Follow Up Recommendations No PT follow up    Equipment Recommendations  None recommended by PT    Recommendations for Other Services       Precautions / Restrictions Precautions Precaution Comments: wears home O2      Mobility  Bed Mobility               General bed mobility comments: received in chair  Transfers Overall transfer level: Independent                  Ambulation/Gait Ambulation/Gait assistance: Independent Ambulation Distance (Feet): 160 Feet Assistive device: None Gait Pattern/deviations: WFL(Within Functional Limits)   Gait velocity interpretation: 1.31 - 2.62 ft/sec, indicative of limited community ambulator General Gait Details: steady with ambulation, no difficulties  Stairs Stairs: Yes Stairs assistance: Modified independent (Device/Increase time) Stair Management: One rail Right Number of Stairs: 4 General stair comments: no difficulty  Wheelchair Mobility    Modified Rankin (Stroke Patients Only)       Balance Overall balance assessment: Independent         Standing balance support: During functional activity   Standing balance comment: patient was able to don pants while in standing position             High  level balance activites: Direction changes;Turns;Sudden stops;Head turns High Level Balance Comments: no difficulties             Pertinent Vitals/Pain Pain Assessment: No/denies pain    Home Living Family/patient expects to be discharged to:: Private residence Living Arrangements: Spouse/significant other Available Help at Discharge: Family Type of Home: House Home Access: Stairs to enter(3 stairs and ramp in the back ) Entrance Stairs-Rails: Can reach both Entrance Stairs-Number of Steps: 3 Home Layout: One level Home Equipment: (home O2)      Prior Function Level of Independence: Independent               Hand Dominance   Dominant Hand: Right    Extremity/Trunk Assessment   Upper Extremity Assessment Upper Extremity Assessment: Overall WFL for tasks assessed    Lower Extremity Assessment Lower Extremity Assessment: Overall WFL for tasks assessed(noted increased Edema)       Communication   Communication: No difficulties  Cognition Arousal/Alertness: Awake/alert Behavior During Therapy: WFL for tasks assessed/performed Overall Cognitive Status: Within Functional Limits for tasks assessed                                        General Comments      Exercises     Assessment/Plan    PT Assessment Patent does not need any further PT services  PT Problem List         PT Treatment Interventions  PT Goals (Current goals can be found in the Care Plan section)  Acute Rehab PT Goals PT Goal Formulation: All assessment and education complete, DC therapy    Frequency     Barriers to discharge        Co-evaluation               AM-PAC PT "6 Clicks" Daily Activity  Outcome Measure Difficulty turning over in bed (including adjusting bedclothes, sheets and blankets)?: None Difficulty moving from lying on back to sitting on the side of the bed? : None Difficulty sitting down on and standing up from a chair with arms  (e.g., wheelchair, bedside commode, etc,.)?: None Help needed moving to and from a bed to chair (including a wheelchair)?: None Help needed walking in hospital room?: None Help needed climbing 3-5 steps with a railing? : A Little 6 Click Score: 23    End of Session Equipment Utilized During Treatment: Oxygen Activity Tolerance: Patient tolerated treatment well Patient left: in chair;with call bell/phone within reach Nurse Communication: Mobility status PT Visit Diagnosis: Difficulty in walking, not elsewhere classified (R26.2)    Time: 8299-3716 PT Time Calculation (min) (ACUTE ONLY): 17 min   Charges:   PT Evaluation $PT Eval Low Complexity: 1 Low     PT G Codes:        Alben Deeds, PT DPT  Board Certified Neurologic Specialist [phone]   Duncan Dull 05/05/2018, 10:01 AM

## 2018-05-07 ENCOUNTER — Other Ambulatory Visit: Payer: Self-pay | Admitting: Physician Assistant

## 2018-05-09 ENCOUNTER — Other Ambulatory Visit: Payer: Self-pay | Admitting: Physician Assistant

## 2018-05-22 ENCOUNTER — Other Ambulatory Visit: Payer: Self-pay | Admitting: Radiology

## 2018-05-22 DIAGNOSIS — N2889 Other specified disorders of kidney and ureter: Secondary | ICD-10-CM

## 2018-06-11 ENCOUNTER — Encounter (HOSPITAL_BASED_OUTPATIENT_CLINIC_OR_DEPARTMENT_OTHER): Payer: Self-pay

## 2018-06-11 ENCOUNTER — Other Ambulatory Visit: Payer: Self-pay

## 2018-06-11 ENCOUNTER — Emergency Department (HOSPITAL_BASED_OUTPATIENT_CLINIC_OR_DEPARTMENT_OTHER): Payer: Medicare Other

## 2018-06-11 ENCOUNTER — Emergency Department (HOSPITAL_BASED_OUTPATIENT_CLINIC_OR_DEPARTMENT_OTHER)
Admission: EM | Admit: 2018-06-11 | Discharge: 2018-06-11 | Disposition: A | Discharge status: Home / Self Care | Payer: Medicare Other | Attending: Emergency Medicine | Admitting: Emergency Medicine

## 2018-06-11 DIAGNOSIS — Z79899 Other long term (current) drug therapy: Secondary | ICD-10-CM | POA: Insufficient documentation

## 2018-06-11 DIAGNOSIS — Z87891 Personal history of nicotine dependence: Secondary | ICD-10-CM | POA: Insufficient documentation

## 2018-06-11 DIAGNOSIS — I110 Hypertensive heart disease with heart failure: Secondary | ICD-10-CM | POA: Insufficient documentation

## 2018-06-11 DIAGNOSIS — R0602 Shortness of breath: Secondary | ICD-10-CM | POA: Diagnosis present

## 2018-06-11 DIAGNOSIS — K2900 Acute gastritis without bleeding: Secondary | ICD-10-CM | POA: Insufficient documentation

## 2018-06-11 DIAGNOSIS — J449 Chronic obstructive pulmonary disease, unspecified: Secondary | ICD-10-CM | POA: Diagnosis not present

## 2018-06-11 DIAGNOSIS — I2510 Atherosclerotic heart disease of native coronary artery without angina pectoris: Secondary | ICD-10-CM | POA: Insufficient documentation

## 2018-06-11 DIAGNOSIS — I509 Heart failure, unspecified: Secondary | ICD-10-CM | POA: Diagnosis not present

## 2018-06-11 DIAGNOSIS — Z7984 Long term (current) use of oral hypoglycemic drugs: Secondary | ICD-10-CM | POA: Diagnosis not present

## 2018-06-11 DIAGNOSIS — Z7982 Long term (current) use of aspirin: Secondary | ICD-10-CM | POA: Insufficient documentation

## 2018-06-11 DIAGNOSIS — Z7901 Long term (current) use of anticoagulants: Secondary | ICD-10-CM | POA: Insufficient documentation

## 2018-06-11 LAB — CBC WITH DIFFERENTIAL/PLATELET
BASOS PCT: 0 %
Basophils Absolute: 0 10*3/uL (ref 0.0–0.1)
EOS ABS: 0.1 10*3/uL (ref 0.0–0.7)
EOS PCT: 1 %
HCT: 37.8 % — ABNORMAL LOW (ref 39.0–52.0)
Hemoglobin: 12.7 g/dL — ABNORMAL LOW (ref 13.0–17.0)
Lymphocytes Relative: 23 %
Lymphs Abs: 1.2 10*3/uL (ref 0.7–4.0)
MCH: 23 pg — ABNORMAL LOW (ref 26.0–34.0)
MCHC: 33.6 g/dL (ref 30.0–36.0)
MCV: 68.5 fL — AB (ref 78.0–100.0)
MONO ABS: 0.8 10*3/uL (ref 0.1–1.0)
Monocytes Relative: 15 %
NEUTROS PCT: 61 %
Neutro Abs: 3 10*3/uL (ref 1.7–7.7)
PLATELETS: 192 10*3/uL (ref 150–400)
RBC: 5.52 MIL/uL (ref 4.22–5.81)
RDW: 21.2 % — ABNORMAL HIGH (ref 11.5–15.5)
WBC: 5.1 10*3/uL (ref 4.0–10.5)

## 2018-06-11 LAB — HEPATIC FUNCTION PANEL
ALBUMIN: 3.4 g/dL — AB (ref 3.5–5.0)
ALK PHOS: 93 U/L (ref 38–126)
ALT: 23 U/L (ref 0–44)
AST: 36 U/L (ref 15–41)
BILIRUBIN INDIRECT: 1.3 mg/dL — AB (ref 0.3–0.9)
Bilirubin, Direct: 1 mg/dL — ABNORMAL HIGH (ref 0.0–0.2)
TOTAL PROTEIN: 7.1 g/dL (ref 6.5–8.1)
Total Bilirubin: 2.3 mg/dL — ABNORMAL HIGH (ref 0.3–1.2)

## 2018-06-11 LAB — BASIC METABOLIC PANEL
ANION GAP: 12 (ref 5–15)
BUN: 36 mg/dL — ABNORMAL HIGH (ref 8–23)
CALCIUM: 9 mg/dL (ref 8.9–10.3)
CO2: 30 mmol/L (ref 22–32)
Chloride: 95 mmol/L — ABNORMAL LOW (ref 98–111)
Creatinine, Ser: 2.25 mg/dL — ABNORMAL HIGH (ref 0.61–1.24)
GFR calc Af Amer: 31 mL/min — ABNORMAL LOW (ref >60)
GFR calc non Af Amer: 27 mL/min — ABNORMAL LOW (ref >60)
GLUCOSE: 125 mg/dL — AB (ref 70–99)
Potassium: 4.7 mmol/L (ref 3.5–5.1)
Sodium: 137 mmol/L (ref 135–145)

## 2018-06-11 LAB — URINALYSIS, MICROSCOPIC (REFLEX)

## 2018-06-11 LAB — BRAIN NATRIURETIC PEPTIDE: B Natriuretic Peptide: >4500 pg/mL — ABNORMAL HIGH (ref 0.0–100.0)

## 2018-06-11 LAB — URINALYSIS, ROUTINE W REFLEX MICROSCOPIC
Bilirubin Urine: NEGATIVE
Glucose, UA: NEGATIVE mg/dL
HGB URINE DIPSTICK: NEGATIVE
Ketones, ur: NEGATIVE mg/dL
Leukocytes, UA: NEGATIVE
NITRITE: NEGATIVE
Protein, ur: 30 mg/dL — AB
SPECIFIC GRAVITY, URINE: 1.01 (ref 1.005–1.030)
pH: 6 (ref 5.0–8.0)

## 2018-06-11 LAB — PROTIME-INR
INR: 2.05
PROTHROMBIN TIME: 23 s — AB (ref 11.4–15.2)

## 2018-06-11 LAB — TROPONIN I: Troponin I: 0.05 ng/mL (ref <0.03)

## 2018-06-11 LAB — LIPASE, BLOOD: Lipase: 42 U/L (ref 11–51)

## 2018-06-11 MED ORDER — GI COCKTAIL ~~LOC~~
30.0000 mL | Freq: Once | ORAL | Status: AC
  Administered 2018-06-11: 30 mL via ORAL
  Filled 2018-06-11: qty 30

## 2018-06-11 MED ORDER — FUROSEMIDE 10 MG/ML IJ SOLN
60.0000 mg | Freq: Once | INTRAMUSCULAR | Status: AC
  Administered 2018-06-11: 60 mg via INTRAVENOUS
  Filled 2018-06-11: qty 6

## 2018-06-11 MED ORDER — SUCRALFATE 1 G PO TABS: 1.0000 g | ORAL_TABLET | Freq: Three times a day (TID) | ORAL | 0 refills | Status: DC

## 2018-06-11 NOTE — Progress Notes (Signed)
Patient ambulated half way around the department while on 2 liter nasal cannula which is his practice at home when he goes to the mailbox.  Patient's SPO2 remained at 100% and HR remained at between 82 and 86.  Patient continues to have epigastric pain.

## 2018-06-11 NOTE — ED Provider Notes (Signed)
[facility] EMERGENCY DEPARTMENT Provider Note   CSN: [id number] Arrival date & time: 06/11/18  1703     History   Chief Complaint Chief Complaint  Patient presents with  . Shortness of Breath    HPI Spencer Rice is a 77 y.o. male.  HPI Patient presents with several days of worsening shortness of breath, lower extremity swelling.  States he is having increased orthopnea.  Has had minimal cough without sputum production.  Denies any fever or chills.  No chest pain.  States he has been taking furosemide as previously prescribed.  Has had some constipation and mild abdominal swelling.  Denies nausea or vomiting.  No blood in stool.  No urinary difficulty. Past Medical History:  Diagnosis Date  . AICD (automatic cardioverter/defibrillator) present   . Cancer of left kidney ([place])    S/P OR 05/2014  . CHF (congestive heart failure) ([place])   . Coronary artery disease   . DVT (deep venous thrombosis) ([facility]) 1983   LLE  . GERD (gastroesophageal reflux disease)   . History of hiatal hernia   . Hypertension     Patient Active Problem List   Diagnosis Date Noted  . CHF (congestive heart failure) ([place]) 05/03/2018  . Acute on chronic systolic (congestive) heart failure ([place]) 05/03/2018  . Glucose intolerance (impaired glucose tolerance) 06/23/2016  . Vitreous floaters 12/08/2015  . TIA (transient ischemic attack) 12/08/2015  . SOB (shortness of breath) 12/08/2015  . Posterior vitreous detachment of both eyes 12/08/2015  . Pain due to dental caries 12/08/2015  . Other abnormal glucose 12/08/2015  . Osteoarthrosis 12/08/2015  . Long term (current) use of anticoagulants 12/08/2015  . Kidney mass 12/08/2015  . Hypersomnolence 12/08/2015  . Hyperopia with presbyopia of both eyes 12/08/2015  . Hyperlipidemia 12/08/2015  . History of orthopnea 12/08/2015  . COPD (chronic obstructive pulmonary disease) ([place]) 12/08/2015  . 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 AKI  (acute kidney injury) ([place]) 12/08/2015  . Abnormal CT scan, kidney 12/08/2015  . Abdominal bloating 12/08/2015  . Biventricular ICD (implantable cardioverter-defibrillator) in place 12/07/2015  . Chest pain at rest 11/29/2015  . Essential hypertension 11/29/2015  . Acute on chronic systolic CHF (congestive heart failure), NYHA class 1 ([place]) 11/29/2015  . Gastroesophageal reflux disease without esophagitis 11/29/2015  . Pain in the chest   . Renal cancer ([place]) 08/16/2015  . 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 Cancer of kidney ([place]) 01/19/2015  . Hematuria 09/04/2014  . Psychosexual dysfunction with inhibited sexual excitement 07/29/2014  . Pelvic pain in male 07/29/2014  . Increased urinary frequency 07/29/2014  . Heartburn 07/29/2014  . EKG, abnormal 07/29/2014  . Corneal scar 07/29/2014  . 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 Cardiomyopathy ([place]) 07/29/2014  . CAD (coronary artery disease) 07/29/2014  . Disorder of kidney and ureter 04/09/2014    Past Surgical History:  Procedure Laterality Date  . CARDIAC CATHETERIZATION  1980's  . IMPLANTABLE CARDIOVERTER DEFIBRILLATOR IMPLANT  07/21/2010  . IR GENERIC HISTORICAL  12/23/2014   IR RADIOLOGIST EVAL & MGMT 12/23/2014 [name], MD [facility] INTERV RAD  . IR GENERIC HISTORICAL  07/20/2016   IR RADIOLOGIST EVAL & MGMT 07/20/2016 [facility] INTERV RAD  . IR RADIOLOGIST EVAL & MGMT  07/04/2017  . RENAL CRYOABLATION Left 05/2014        Home Medications    Prior to Admission medications   Medication Sig Start Date End Date Taking? Authorizing Provider  furosemide (LASIX)  20 MG tablet Take 30 mg by mouth 2 (two) times daily.   Yes [provider]  amiodarone (PACERONE) 200 MG tablet Take 200 mg by mouth daily.    [provider]  aspirin 81 MG chewable tablet Chew 81 mg by mouth.    [provider]  benzonatate (TESSALON) 100 MG capsule Take 1 capsule (100 mg total) by mouth every 8 (eight)  hours. 08/15/17   Palumbo, April, MD  budesonide-formoterol [facility]) 160-4.5 MCG/ACT inhaler Inhale into the lungs. 09/27/17   [provider]  carvedilol (COREG) 3.125 MG tablet Take 1 tablet (3.125 mg total) by mouth 2 (two) times daily with a meal. 05/05/18   Bhagat, Bhavinkumar, PA  esomeprazole (NEXIUM) 20 MG capsule Take 20 mg by mouth daily as needed (FOR HEARTBURN).    [provider]  Fluticasone-Salmeterol (ADVAIR HFA IN) Inhale into the lungs.    [provider]  JARDIANCE 10 MG TABS tablet Take 10 mg by mouth daily. 03/06/18   [provider]  omeprazole (PRILOSEC) 20 MG capsule Take 20 mg by mouth daily.    [provider]  sucralfate (CARAFATE) 1 g tablet Take 1 tablet (1 g total) by mouth 4 (four) times daily -  with meals and at bedtime. 06/11/18   Julianne Rice, MD  tamsulosin (FLOMAX) 0.4 MG CAPS capsule Take 0.4 mg by mouth.    [provider]  torsemide (DEMADEX) 20 MG tablet Take 1.5 tablet (30mg ) in AM and 1 table (20mg ) in PM daily 05/05/18   Bhagat, [place], PA  warfarin (COUMADIN) 5 MG tablet Take 2.5 mg by mouth daily. Take 2.5 mg daily except 5 mg Thursday    [provider]    Family History Family History  Problem Relation Age of Onset  . Heart disease Father   . Heart attack Father 64  . Hypertension Father   . Hypertension Mother     Social History Social History   Tobacco Use  . Smoking status: Former Smoker    Packs/day: 0.75    Years: 17.00    Pack years: 12.75    Types: Cigarettes    Start date: 05/08/1959    Last attempt to quit: 06/20/1976    Years since quitting: 42.0  . Smokeless tobacco: Never Used  Substance Use Topics  . Alcohol use: No  . Drug use: No     Allergies   Patient has no known allergies.   Review of Systems Review of Systems  Constitutional: Negative for chills, fatigue and fever.  Respiratory: Positive for cough and shortness of breath.   Cardiovascular:  Positive for leg swelling. Negative for chest pain and palpitations.  Gastrointestinal: Positive for abdominal distention and constipation. Negative for abdominal pain, diarrhea, nausea and vomiting.  Genitourinary: Positive for frequency. Negative for dysuria, flank pain and hematuria.  Musculoskeletal: Negative for back pain, myalgias, neck pain and neck stiffness.  Skin: Negative for rash and wound.  Neurological: Negative for dizziness, weakness, light-headedness, numbness and headaches.  All other systems reviewed and are negative.    Physical Exam Updated Vital Signs BP 117/75 (BP Location: Right Arm)   Pulse 64   Temp 98.1 F (36.7 C) (Oral)   Resp 20   Ht 6\' 1"  (1.854 m)   Wt 92.1 kg (203 lb)   SpO2 100%   BMI 26.78 kg/m   Physical Exam  Constitutional: He is oriented to person, place, and time. He appears well-developed and well-nourished. No distress.  HENT:  Head: Normocephalic and atraumatic.  Mouth/Throat: Oropharynx is clear and moist. No oropharyngeal exudate.  Eyes: Pupils are equal, round, and reactive to light. EOM are normal.  Neck: Normal range of motion. Neck supple. JVD present.  Cardiovascular: Normal rate and regular rhythm. Exam reveals no gallop and no friction rub.  No murmur heard. Pulmonary/Chest: Effort normal.  Diminished breath sounds in the right base.  Abdominal: Soft. Bowel sounds are normal. He exhibits distension. There is no tenderness. There is no rebound and no guarding.  Abdomen is distended.  Mild epigastric tenderness to palpation.  Musculoskeletal: Normal range of motion. He exhibits no edema or tenderness.  2+ bilateral lower extremity edema.  Distal pulses intact.  No asymmetry or tenderness.  Neurological: He is alert and oriented to person, place, and time.  Moving all extremities without focal deficit.  Sensation intact.  Skin: Skin is warm and dry. No rash noted. He is not diaphoretic. No erythema.  Psychiatric: He has a  normal mood and affect. His behavior is normal.  Nursing note and vitals reviewed.    ED Treatments / Results  Labs (all labs ordered are listed, but only abnormal results are displayed) Labs Reviewed  BASIC METABOLIC PANEL - Abnormal; Notable for the following components:      Result Value   Chloride 95 (*)    Glucose, Bld 125 (*)    BUN 36 (*)    Creatinine, Ser 2.25 (*)    GFR calc non Af Amer 27 (*)    GFR calc Af Amer 31 (*)    All other components within normal limits  TROPONIN I - Abnormal; Notable for the following components:   Troponin I 0.05 (*)    All other components within normal limits  BRAIN NATRIURETIC PEPTIDE - Abnormal; Notable for the following components:   B Natriuretic Peptide >4,500.0 (*)    All other components within normal limits  URINALYSIS, ROUTINE W REFLEX MICROSCOPIC - Abnormal; Notable for the following components:   Protein, ur 30 (*)    All other components within normal limits  HEPATIC FUNCTION PANEL - Abnormal; Notable for the following components:   Albumin 3.4 (*)    Total Bilirubin 2.3 (*)    Bilirubin, Direct 1.0 (*)    Indirect Bilirubin 1.3 (*)    All other components within normal limits  CBC WITH DIFFERENTIAL/PLATELET - Abnormal; Notable for the following components:   Hemoglobin 12.7 (*)    HCT 37.8 (*)    MCV 68.5 (*)    MCH 23.0 (*)    RDW 21.2 (*)    All other components within normal limits  PROTIME-INR - Abnormal; Notable for the following components:   Prothrombin Time 23.0 (*)    All other components within normal limits  URINALYSIS, MICROSCOPIC (REFLEX) - Abnormal; Notable for the following components:   Bacteria, UA FEW (*)    All other components within normal limits  LIPASE, BLOOD    EKG EKG Interpretation  Date/Time:  Tuesday June 11 2018 18:00:34 EDT Ventricular Rate:  71 PR Interval:    QRS Duration: 180 QT Interval:  470 QTC Calculation: 532 R Axis:   -73 Text Interpretation:  Atrial-ventricular  dual-paced rhythm No further analysis attempted due to paced rhythm Confirmed by Julianne Rice [phone]) on 06/11/2018 6:09:06 PM   Radiology Dg Chest 2 View  Result Date: 06/11/2018 CLINICAL DATA:  Dyspnea and leg swelling. EXAM: CHEST - 2 VIEW COMPARISON:  05/03/2018 FINDINGS: Interval increase in right-sided pleural effusion  with compressive atelectasis. The effusion now spans two vertebral body heights on the lateral view versus one previously. Cardiomegaly with aortic atherosclerosis is again noted. Left-sided ICD device with leads in the right atrium, coronary sinus and right atrium are redemonstrated. Left lung remains clear. No acute osseous abnormality is seen. IMPRESSION: 1. Interval increase in moderate right effusion now spanning two vertebral body heights on the lateral view versus one previously. Adjacent right basilar compressive atelectasis. 2. The cardiac silhouette is enlarged as before. There is moderate aortic atherosclerosis. Electronically Signed   By: Ashley Royalty M.D.   On: 06/11/2018 18:49    Procedures Procedures (including critical care time)  Medications Ordered in ED Medications  furosemide (LASIX) injection 60 mg (60 mg Intravenous Given 06/11/18 1901)  gi cocktail (Maalox,Lidocaine,Donnatal) (30 mLs Oral Given 06/11/18 2139)     Initial Impression / Assessment and Plan / ED Course  I have reviewed the triage vital signs and the nursing notes.  Pertinent labs & imaging results that were available during my care of the patient were reviewed by me and considered in my medical decision making (see chart for details).     Patient given IV Lasix in the emergency department with good diuresis.  Ambulating without significant dyspnea tachycardia or hypoxia.  Received GI cocktail and upper abdominal pain has resolved.  He is taking Prilosec daily.  We will add Carafate 4 times daily and have advised close follow-up with both his primary physician and gastroenterology.  Strict  return precautions have been given.  Final Clinical Impressions(s) / ED Diagnoses   Final diagnoses:  Other congestive heart failure ([place])  Acute gastritis, presence of bleeding unspecified, unspecified gastritis type    ED Discharge Orders        Ordered    sucralfate (CARAFATE) 1 g tablet  3 times daily with meals & bedtime     06/11/18 2240       Julianne Rice, MD 06/11/18 2241

## 2018-06-11 NOTE — ED Notes (Signed)
Pt verbalizes understanding of d/c instructions and denies any further needs at this time. 

## 2018-06-11 NOTE — ED Triage Notes (Signed)
Pt c/o SOB, bliat LE swelling and abd swelling x 2-3 days-to triage in w/c with O2 [facility]

## 2018-06-24 ENCOUNTER — Telehealth: Payer: Self-pay | Admitting: Radiology

## 2018-06-24 NOTE — Telephone Encounter (Signed)
Left voice mail on M# to request patient call for recent CT results.  Next follow up July 2020.    Kahlee Metivier Riki Rusk, RN 06/24/2018 2:01 PM

## 2018-10-01 ENCOUNTER — Other Ambulatory Visit: Payer: Self-pay

## 2018-10-01 ENCOUNTER — Encounter (HOSPITAL_BASED_OUTPATIENT_CLINIC_OR_DEPARTMENT_OTHER): Payer: Self-pay

## 2018-10-01 ENCOUNTER — Emergency Department (HOSPITAL_BASED_OUTPATIENT_CLINIC_OR_DEPARTMENT_OTHER): Payer: Medicare Other

## 2018-10-01 ENCOUNTER — Emergency Department (HOSPITAL_BASED_OUTPATIENT_CLINIC_OR_DEPARTMENT_OTHER)
Admission: EM | Admit: 2018-10-01 | Discharge: 2018-10-02 | Disposition: A | Discharge status: Home / Self Care | Payer: Medicare Other | Attending: Emergency Medicine | Admitting: Emergency Medicine

## 2018-10-01 DIAGNOSIS — R791 Abnormal coagulation profile: Secondary | ICD-10-CM

## 2018-10-01 DIAGNOSIS — R0602 Shortness of breath: Secondary | ICD-10-CM

## 2018-10-01 DIAGNOSIS — I509 Heart failure, unspecified: Secondary | ICD-10-CM | POA: Diagnosis not present

## 2018-10-01 DIAGNOSIS — I110 Hypertensive heart disease with heart failure: Secondary | ICD-10-CM | POA: Diagnosis not present

## 2018-10-01 DIAGNOSIS — Z87891 Personal history of nicotine dependence: Secondary | ICD-10-CM | POA: Diagnosis not present

## 2018-10-01 DIAGNOSIS — Z7901 Long term (current) use of anticoagulants: Secondary | ICD-10-CM | POA: Insufficient documentation

## 2018-10-01 DIAGNOSIS — I2510 Atherosclerotic heart disease of native coronary artery without angina pectoris: Secondary | ICD-10-CM | POA: Diagnosis not present

## 2018-10-01 DIAGNOSIS — Z7982 Long term (current) use of aspirin: Secondary | ICD-10-CM | POA: Insufficient documentation

## 2018-10-01 DIAGNOSIS — Z79899 Other long term (current) drug therapy: Secondary | ICD-10-CM | POA: Diagnosis not present

## 2018-10-01 DIAGNOSIS — Z95810 Presence of automatic (implantable) cardiac defibrillator: Secondary | ICD-10-CM | POA: Diagnosis not present

## 2018-10-01 DIAGNOSIS — Z85528 Personal history of other malignant neoplasm of kidney: Secondary | ICD-10-CM | POA: Insufficient documentation

## 2018-10-01 DIAGNOSIS — Z8673 Personal history of transient ischemic attack (TIA), and cerebral infarction without residual deficits: Secondary | ICD-10-CM | POA: Diagnosis not present

## 2018-10-01 LAB — CBC
HEMATOCRIT: 43 % (ref 39.0–52.0)
HEMOGLOBIN: 13.6 g/dL (ref 13.0–17.0)
MCH: 24.6 pg — ABNORMAL LOW (ref 26.0–34.0)
MCHC: 31.6 g/dL (ref 30.0–36.0)
MCV: 77.8 fL — ABNORMAL LOW (ref 80.0–100.0)
NRBC: 0 % (ref 0.0–0.2)
Platelets: 94 10*3/uL — ABNORMAL LOW (ref 150–400)
RBC: 5.53 MIL/uL (ref 4.22–5.81)
RDW: 23.5 % — AB (ref 11.5–15.5)
WBC: 5.2 10*3/uL (ref 4.0–10.5)

## 2018-10-01 LAB — BASIC METABOLIC PANEL
Anion gap: 13 (ref 5–15)
BUN: 30 mg/dL — ABNORMAL HIGH (ref 8–23)
CHLORIDE: 98 mmol/L (ref 98–111)
CO2: 29 mmol/L (ref 22–32)
Calcium: 9 mg/dL (ref 8.9–10.3)
Creatinine, Ser: 2.01 mg/dL — ABNORMAL HIGH (ref 0.61–1.24)
GFR, EST AFRICAN AMERICAN: 35 mL/min — AB (ref >60)
GFR, EST NON AFRICAN AMERICAN: 30 mL/min — AB (ref >60)
Glucose, Bld: 106 mg/dL — ABNORMAL HIGH (ref 70–99)
POTASSIUM: 3.5 mmol/L (ref 3.5–5.1)
Sodium: 140 mmol/L (ref 135–145)

## 2018-10-01 LAB — PROTIME-INR
INR: 4.38 — AB
Prothrombin Time: 41.2 seconds — ABNORMAL HIGH (ref 11.4–15.2)

## 2018-10-01 LAB — TROPONIN I: TROPONIN I: 0.06 ng/mL — AB (ref <0.03)

## 2018-10-01 MED ORDER — FUROSEMIDE 10 MG/ML IJ SOLN
60.0000 mg | INTRAMUSCULAR | Status: AC
  Administered 2018-10-01: 60 mg via INTRAVENOUS
  Filled 2018-10-01: qty 6

## 2018-10-01 NOTE — ED Provider Notes (Signed)
[facility] EMERGENCY DEPARTMENT Provider Note   CSN: [id number] Arrival date & time: 10/01/18  1918     History   Chief Complaint Chief Complaint  Patient presents with  . Shortness of Breath    HPI Spencer Rice is a 77 y.o. male.  A 67-year-old male with extensive past medical history including CHF, AICD, chronic oxygen use, hypertension, TIA, DVT, Coumadin use who presents with shortness of breath.  Patient states that he has had 1 week of persistent shortness of breath as well as increased lower extremity edema.  He has been compliant with his medications but he admits that he struggles with fluid restrictions and diet.  His weight has been stable with no significant weight gain recently, he weighs himself daily.  He notes that he has been able to perform tasks around the house and ADLs without problem but has not felt motivated to mow his lawn.  He denies any associated chest pain.  Occasional cough but no persistent cough.  No fevers, vomiting, or diarrhea.  No change in chronic oxygen requirement.  The history is provided by the patient.  Shortness of Breath     Past Medical History:  Diagnosis Date  . AICD (automatic cardioverter/defibrillator) present   . Cancer of left kidney ([place])    S/P OR 05/2014  . CHF (congestive heart failure) ([place])   . Coronary artery disease   . DVT (deep venous thrombosis) ([facility]) 1983   LLE  . GERD (gastroesophageal reflux disease)   . History of hiatal hernia   . Hypertension     Patient Active Problem List   Diagnosis Date Noted  . CHF (congestive heart failure) ([place]) 05/03/2018  . Acute on chronic systolic (congestive) heart failure ([place]) 05/03/2018  . Glucose intolerance (impaired glucose tolerance) 06/23/2016  . Vitreous floaters 12/08/2015  . TIA (transient ischemic attack) 12/08/2015  . SOB (shortness of breath) 12/08/2015  . Posterior vitreous detachment of both eyes 12/08/2015  . Pain due to dental caries  12/08/2015  . Other abnormal glucose 12/08/2015  . Osteoarthrosis 12/08/2015  . Long term (current) use of anticoagulants 12/08/2015  . Kidney mass 12/08/2015  . Hypersomnolence 12/08/2015  . Hyperopia with presbyopia of both eyes 12/08/2015  . Hyperlipidemia 12/08/2015  . History of orthopnea 12/08/2015  . COPD (chronic obstructive pulmonary disease) ([place]) 12/08/2015  . 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 AKI (acute kidney injury) ([place]) 12/08/2015  . Abnormal CT scan, kidney 12/08/2015  . Abdominal bloating 12/08/2015  . Biventricular ICD (implantable cardioverter-defibrillator) in place 12/07/2015  . Chest pain at rest 11/29/2015  . Essential hypertension 11/29/2015  . Acute on chronic systolic CHF (congestive heart failure), NYHA class 1 ([place]) 11/29/2015  . Gastroesophageal reflux disease without esophagitis 11/29/2015  . Pain in the chest   . Renal cancer ([place]) 08/16/2015  . 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 Cancer of kidney ([place]) 01/19/2015  . Hematuria 09/04/2014  . Psychosexual dysfunction with inhibited sexual excitement 07/29/2014  . Pelvic pain in male 07/29/2014  . Increased urinary frequency 07/29/2014  . Heartburn 07/29/2014  . EKG, abnormal 07/29/2014  . Corneal scar 07/29/2014  . 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 Cardiomyopathy ([place]) 07/29/2014  . CAD (coronary artery disease) 07/29/2014  . Disorder of kidney and ureter 04/09/2014    Past Surgical History:  Procedure Laterality Date  . CARDIAC CATHETERIZATION  1980's  . IMPLANTABLE CARDIOVERTER DEFIBRILLATOR IMPLANT  07/21/2010  . IR GENERIC HISTORICAL  12/23/2014   IR RADIOLOGIST EVAL & MGMT 12/23/2014 [name], MD [facility] INTERV RAD  . IR GENERIC HISTORICAL  07/20/2016   IR RADIOLOGIST EVAL & MGMT 07/20/2016 [facility] INTERV RAD  . IR RADIOLOGIST EVAL & MGMT  07/04/2017  . RENAL CRYOABLATION Left 05/2014        Home Medications    Prior to Admission medications     Medication Sig Start Date End Date Taking? Authorizing Provider  amiodarone (PACERONE) 200 MG tablet Take 200 mg by mouth daily.    [provider]  aspirin 81 MG chewable tablet Chew 81 mg by mouth.    [provider]  benzonatate (TESSALON) 100 MG capsule Take 1 capsule (100 mg total) by mouth every 8 (eight) hours. 08/15/17   Palumbo, April, MD  budesonide-formoterol [facility]) 160-4.5 MCG/ACT inhaler Inhale into the lungs. 09/27/17   [provider]  carvedilol (COREG) 3.125 MG tablet Take 1 tablet (3.125 mg total) by mouth 2 (two) times daily with a meal. 05/05/18   Bhagat, Bhavinkumar, PA  esomeprazole (NEXIUM) 20 MG capsule Take 20 mg by mouth daily as needed (FOR HEARTBURN).    [provider]  Fluticasone-Salmeterol (ADVAIR HFA IN) Inhale into the lungs.    [provider]  furosemide (LASIX) 20 MG tablet Take 30 mg by mouth 2 (two) times daily.    [provider]  JARDIANCE 10 MG TABS tablet Take 10 mg by mouth daily. 03/06/18   [provider]  omeprazole (PRILOSEC) 20 MG capsule Take 20 mg by mouth daily.    [provider]  sucralfate (CARAFATE) 1 g tablet Take 1 tablet (1 g total) by mouth 4 (four) times daily -  with meals and at bedtime. 06/11/18   Julianne Rice, MD  tamsulosin (FLOMAX) 0.4 MG CAPS capsule Take 0.4 mg by mouth.    [provider]  torsemide (DEMADEX) 20 MG tablet Take 1.5 tablet (30mg ) in AM and 1 table (20mg ) in PM daily 05/05/18   Bhagat, [place], PA  warfarin (COUMADIN) 5 MG tablet Take 2.5 mg by mouth daily. Take 2.5 mg daily except 5 mg Thursday    [provider]    Family History Family History  Problem Relation Age of Onset  . Heart disease Father   . Heart attack Father 42  . Hypertension Father   . Hypertension Mother     Social History Social History   Tobacco Use  . Smoking status: Former Smoker    Packs/day: 0.75    Years: 17.00    Pack years: 12.75     Types: Cigarettes    Start date: 05/08/1959    Last attempt to quit: 06/20/1976    Years since quitting: 42.3  . Smokeless tobacco: Never Used  Substance Use Topics  . Alcohol use: No  . Drug use: No     Allergies   Patient has no known allergies.   Review of Systems Review of Systems  Respiratory: Positive for shortness of breath.    All other systems reviewed and are negative except that which was mentioned in HPI   Physical Exam Updated Vital Signs BP 106/75   Pulse 69   Temp 97.9 F (36.6 C) (Oral)   Resp (!) 21   Ht 6' 1.5" (1.867 m)   Wt 91.2 kg   SpO2 99%   BMI 26.16 kg/m   Physical Exam  Constitutional: He is oriented to person, place, and time. He appears well-developed and well-nourished. No distress.  HENT:  Head: Normocephalic and atraumatic.  Moist mucous membranes  Eyes: Conjunctivae are normal.  Neck: Neck supple.  Cardiovascular: Normal rate and regular rhythm.  Murmur heard. Pulmonary/Chest: Effort normal.  Diminished breath sounds R lower lung  Abdominal: Soft. Bowel sounds are normal. He exhibits no distension. There is no tenderness.  Musculoskeletal:       Right lower leg: He exhibits edema.       Left lower leg: He exhibits edema.  2+ pitting edema BLE  Neurological: He is alert and oriented to person, place, and time.  Fluent speech  Skin: Skin is warm and dry.  Psychiatric: He has a normal mood and affect. Judgment normal.  Nursing note and vitals reviewed.    ED Treatments / Results  Labs (all labs ordered are listed, but only abnormal results are displayed) Labs Reviewed  BASIC METABOLIC PANEL - Abnormal; Notable for the following components:      Result Value   Glucose, Bld 106 (*)    BUN 30 (*)    Creatinine, Ser 2.01 (*)    GFR calc non Af Amer 30 (*)    GFR calc Af Amer 35 (*)    All other components within normal limits  CBC - Abnormal; Notable for the following components:   MCV 77.8 (*)    MCH 24.6 (*)    RDW  23.5 (*)    Platelets 94 (*)    All other components within normal limits  TROPONIN I - Abnormal; Notable for the following components:   Troponin I 0.06 (*)    All other components within normal limits  PROTIME-INR - Abnormal; Notable for the following components:   Prothrombin Time 41.2 (*)    INR 4.38 (*)    All other components within normal limits  BRAIN NATRIURETIC PEPTIDE    EKG EKG Interpretation  Date/Time:  Tuesday October 01 2018 19:31:56 EDT Ventricular Rate:  83 PR Interval:  204 QRS Duration: 156 QT Interval:  472 QTC Calculation: 554 R Axis:   -67 Text Interpretation:  ** Suspect unspecified pacemaker failure Atrial-sensed ventricular-paced rhythm Abnormal ECG similar to previous Confirmed by Theotis Burrow [phone]) on 10/01/2018 8:46:22 PM   Radiology Dg Chest 2 View  Result Date: 10/01/2018 CLINICAL DATA:  Shortness of breath. Cough for 1 week. EXAM: CHEST - 2 VIEW COMPARISON:  06/11/2018 FINDINGS: An ICD remains in place with unchanged appearance of right atrial, right ventricular, and coronary sinus leads. Moderate to severe enlargement of the cardiac silhouette is similar to the prior study. Aortic atherosclerosis is noted. Moderate-sized right pleural effusion has not significantly changed, and there is associated right basilar atelectasis. The left lung is clear. No pneumothorax is identified. IMPRESSION: Unchanged moderate right pleural effusion and right basilar atelectasis. Electronically Signed   By: Logan Bores M.D.   On: 10/01/2018 20:14    Procedures Procedures (including critical care time)  Medications Ordered in ED Medications  furosemide (LASIX) injection 60 mg (60 mg Intravenous Given 10/01/18 2342)     Initial Impression / Assessment and Plan / ED Course  I have reviewed the triage vital signs and the nursing notes.  Pertinent labs & imaging results that were available during my care of the patient were reviewed by me and considered in  my medical decision making (see chart for details).     Comfortable on exam, respiratory distress.  Stable vital signs on home O2 level at 2 L.  Chest x-ray shows unchanged moderate right pleural effusion.  EKG similar to previous.  Weight today is stable.  Lab work shows stable creatinine at 2, normal hemoglobin, 0.06 similar to multiple previous values. INR 4.38, will have pt hold a dose of coumadin. Have given IV lasix dose. We will ambulate patient to ensure he is not desaturating. If he can ambulate successfully, I anticipate he will be safe for discharge as he is already functioning at home ok with ability to perform ADLS.  The patient out to oncoming provider, Dr. Florina Ou, who will follow up on ambulation.  Final Clinical Impressions(s) / ED Diagnoses   Final diagnoses:  SOB (shortness of breath)  Congestive heart failure, unspecified HF chronicity, unspecified heart failure type ([place])  Supratherapeutic INR    ED Discharge Orders    None       Aaiden Depoy, Wenda Overland, MD 10/02/18 0010

## 2018-10-01 NOTE — ED Notes (Signed)
Pt has bilateral lower extremity edema

## 2018-10-01 NOTE — ED Triage Notes (Signed)
C/o SOB x 1 week-to triage in w/c with O2 @2LNC -[place]D

## 2018-10-01 NOTE — ED Notes (Signed)
CRITICAL VALUE ALERT  Critical Value:  INR 4.38   Provider Notified: Dr. Rex Kras

## 2018-10-01 NOTE — ED Notes (Signed)
CRITICAL VALUE ALERT  Critical Value:  Trop 0.06   Provider Notified: Dr. Rex Kras

## 2018-10-01 NOTE — ED Notes (Signed)
ED Provider at bedside. 

## 2018-10-02 NOTE — Discharge Instructions (Addendum)
DO NOT TAKE COUMADIN TOMORROW (Wednesday) BECAUSE YOUR INR WAS TOO HIGH. THEN RESUME YOUR NORMAL DOSE. HAVE YOUR INR RECHECKED NEXT WEEK. CALL YOUR CARDIOLOGIST FOR A FOLLOW UP APPOINTMENT. TRY TO AVOID SALT AND KEEP WATER DRINKING MINIMAL. RETURN TO ER IF YOU HAVE WORSENING SHORTNESS OF BREATH, FEEL LIKE YOU ARE GOING TO PASS OUT, OR HAVE CHEST PAIN.

## 2018-10-02 NOTE — ED Notes (Signed)
ED Provider at bedside. 

## 2018-10-03 ENCOUNTER — Telehealth (HOSPITAL_BASED_OUTPATIENT_CLINIC_OR_DEPARTMENT_OTHER): Payer: Self-pay | Admitting: *Deleted

## 2018-10-03 LAB — BRAIN NATRIURETIC PEPTIDE

## 2018-10-03 NOTE — Telephone Encounter (Signed)
Attempted to make contact with pt via telephone unsuccessful.

## 2018-10-04 ENCOUNTER — Telehealth (HOSPITAL_BASED_OUTPATIENT_CLINIC_OR_DEPARTMENT_OTHER): Payer: Self-pay | Admitting: Emergency Medicine

## 2018-11-22 ENCOUNTER — Other Ambulatory Visit: Payer: Self-pay

## 2018-11-22 ENCOUNTER — Emergency Department (HOSPITAL_BASED_OUTPATIENT_CLINIC_OR_DEPARTMENT_OTHER)
Admission: EM | Admit: 2018-11-22 | Discharge: 2018-11-22 | Disposition: A | Discharge status: Home / Self Care | Payer: Medicare Other | Attending: Emergency Medicine | Admitting: Emergency Medicine

## 2018-11-22 ENCOUNTER — Emergency Department (HOSPITAL_BASED_OUTPATIENT_CLINIC_OR_DEPARTMENT_OTHER): Payer: Medicare Other

## 2018-11-22 ENCOUNTER — Encounter (HOSPITAL_BASED_OUTPATIENT_CLINIC_OR_DEPARTMENT_OTHER): Payer: Self-pay | Admitting: Adult Health

## 2018-11-22 DIAGNOSIS — I110 Hypertensive heart disease with heart failure: Secondary | ICD-10-CM | POA: Insufficient documentation

## 2018-11-22 DIAGNOSIS — Z87891 Personal history of nicotine dependence: Secondary | ICD-10-CM | POA: Insufficient documentation

## 2018-11-22 DIAGNOSIS — R0602 Shortness of breath: Secondary | ICD-10-CM | POA: Diagnosis present

## 2018-11-22 DIAGNOSIS — Z7982 Long term (current) use of aspirin: Secondary | ICD-10-CM | POA: Insufficient documentation

## 2018-11-22 DIAGNOSIS — Z7984 Long term (current) use of oral hypoglycemic drugs: Secondary | ICD-10-CM | POA: Insufficient documentation

## 2018-11-22 DIAGNOSIS — I2510 Atherosclerotic heart disease of native coronary artery without angina pectoris: Secondary | ICD-10-CM | POA: Diagnosis not present

## 2018-11-22 DIAGNOSIS — J449 Chronic obstructive pulmonary disease, unspecified: Secondary | ICD-10-CM | POA: Diagnosis not present

## 2018-11-22 DIAGNOSIS — Z7901 Long term (current) use of anticoagulants: Secondary | ICD-10-CM | POA: Insufficient documentation

## 2018-11-22 DIAGNOSIS — Z79899 Other long term (current) drug therapy: Secondary | ICD-10-CM | POA: Insufficient documentation

## 2018-11-22 DIAGNOSIS — I509 Heart failure, unspecified: Secondary | ICD-10-CM | POA: Diagnosis not present

## 2018-11-22 LAB — BASIC METABOLIC PANEL
ANION GAP: 10 (ref 5–15)
BUN: 37 mg/dL — AB (ref 8–23)
CO2: 29 mmol/L (ref 22–32)
Calcium: 8.8 mg/dL — ABNORMAL LOW (ref 8.9–10.3)
Chloride: 101 mmol/L (ref 98–111)
Creatinine, Ser: 2.24 mg/dL — ABNORMAL HIGH (ref 0.61–1.24)
GFR calc Af Amer: 32 mL/min — ABNORMAL LOW (ref >60)
GFR calc non Af Amer: 27 mL/min — ABNORMAL LOW (ref >60)
Glucose, Bld: 105 mg/dL — ABNORMAL HIGH (ref 70–99)
POTASSIUM: 3.2 mmol/L — AB (ref 3.5–5.1)
SODIUM: 140 mmol/L (ref 135–145)

## 2018-11-22 LAB — CBC WITH DIFFERENTIAL/PLATELET
ABS IMMATURE GRANULOCYTES: 0.01 10*3/uL (ref 0.00–0.07)
BASOS ABS: 0 10*3/uL (ref 0.0–0.1)
BASOS PCT: 0 %
Eosinophils Absolute: 0 10*3/uL (ref 0.0–0.5)
Eosinophils Relative: 1 %
HCT: 43 % (ref 39.0–52.0)
Hemoglobin: 13.5 g/dL (ref 13.0–17.0)
Immature Granulocytes: 0 %
LYMPHS PCT: 20 %
Lymphs Abs: 1.1 10*3/uL (ref 0.7–4.0)
MCH: 25.1 pg — AB (ref 26.0–34.0)
MCHC: 31.4 g/dL (ref 30.0–36.0)
MCV: 80.1 fL (ref 80.0–100.0)
Monocytes Absolute: 0.6 10*3/uL (ref 0.1–1.0)
Monocytes Relative: 12 %
NEUTROS ABS: 3.6 10*3/uL (ref 1.7–7.7)
NEUTROS PCT: 67 %
Platelets: 122 10*3/uL — ABNORMAL LOW (ref 150–400)
RBC: 5.37 MIL/uL (ref 4.22–5.81)
RDW: 21.7 % — AB (ref 11.5–15.5)
WBC: 5.3 10*3/uL (ref 4.0–10.5)
nRBC: 0 % (ref 0.0–0.2)

## 2018-11-22 LAB — PROTIME-INR
INR: 3.6
PROTHROMBIN TIME: 35.4 s — AB (ref 11.4–15.2)

## 2018-11-22 LAB — BRAIN NATRIURETIC PEPTIDE

## 2018-11-22 MED ORDER — ALBUTEROL SULFATE (2.5 MG/3ML) 0.083% IN NEBU
5.0000 mg | INHALATION_SOLUTION | Freq: Once | RESPIRATORY_TRACT | Status: AC
  Administered 2018-11-22: 5 mg via RESPIRATORY_TRACT
  Filled 2018-11-22: qty 6

## 2018-11-22 MED ORDER — FUROSEMIDE 10 MG/ML IJ SOLN
60.0000 mg | Freq: Once | INTRAMUSCULAR | Status: AC
  Administered 2018-11-22: 60 mg via INTRAVENOUS
  Filled 2018-11-22: qty 6

## 2018-11-22 NOTE — ED Triage Notes (Signed)
PResents with SOB that began "a long time ago" but worsened today. He is 2 dependent on 2liters at home. Endorses cough/

## 2018-11-22 NOTE — ED Provider Notes (Signed)
[facility] EMERGENCY DEPARTMENT Provider Note   CSN: [id number] Arrival date & time: 11/22/18  1452     History   Chief Complaint Chief Complaint  Patient presents with  . Shortness of Breath    HPI Spencer Rice is a 77 y.o. male.  Patient is a 77 year old male with a history of CHF, DVT on Coumadin, AICD placement, hypertension who presents with shortness of breath.  He is on chronic oxygen at home at 2 L/min.  He states he always has some ups and downs in his breathing but today it got a little worse and he felt like he needed to come in to get some Lasix.  He has had similar symptoms in the past with CHF exacerbations.  He reports increase in his lower extremity swelling.  He denies any chest pain.  He has rare coughing.  No known fevers.  He states he has been taking his medications as directed.  He has not really been checking his weight over the last week or so.  His baseline weight is around 200 pounds.  His cardiologist is Dr. Olegario Shearer in [facility].     Past Medical History:  Diagnosis Date  . AICD (automatic cardioverter/defibrillator) present   . Cancer of left kidney ([place])    S/P OR 05/2014  . CHF (congestive heart failure) ([place])   . Coronary artery disease   . DVT (deep venous thrombosis) ([facility]) 1983   LLE  . GERD (gastroesophageal reflux disease)   . History of hiatal hernia   . Hypertension     Patient Active Problem List   Diagnosis Date Noted  . CHF (congestive heart failure) ([place]) 05/03/2018  . Acute on chronic systolic (congestive) heart failure ([place]) 05/03/2018  . Glucose intolerance (impaired glucose tolerance) 06/23/2016  . Vitreous floaters 12/08/2015  . TIA (transient ischemic attack) 12/08/2015  . SOB (shortness of breath) 12/08/2015  . Posterior vitreous detachment of both eyes 12/08/2015  . Pain due to dental caries 12/08/2015  . Other abnormal glucose 12/08/2015  . Osteoarthrosis 12/08/2015  . Long term (current) use of  anticoagulants 12/08/2015  . Kidney mass 12/08/2015  . Hypersomnolence 12/08/2015  . Hyperopia with presbyopia of both eyes 12/08/2015  . Hyperlipidemia 12/08/2015  . History of orthopnea 12/08/2015  . COPD (chronic obstructive pulmonary disease) ([place]) 12/08/2015  . 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 AKI (acute kidney injury) ([place]) 12/08/2015  . Abnormal CT scan, kidney 12/08/2015  . Abdominal bloating 12/08/2015  . Biventricular ICD (implantable cardioverter-defibrillator) in place 12/07/2015  . Chest pain at rest 11/29/2015  . Essential hypertension 11/29/2015  . Acute on chronic systolic CHF (congestive heart failure), NYHA class 1 ([place]) 11/29/2015  . Gastroesophageal reflux disease without esophagitis 11/29/2015  . Pain in the chest   . Renal cancer ([place]) 08/16/2015  . 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 Cancer of kidney ([place]) 01/19/2015  . Hematuria 09/04/2014  . Psychosexual dysfunction with inhibited sexual excitement 07/29/2014  . Pelvic pain in male 07/29/2014  . Increased urinary frequency 07/29/2014  . Heartburn 07/29/2014  . EKG, abnormal 07/29/2014  . Corneal scar 07/29/2014  . 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 Cardiomyopathy ([place]) 07/29/2014  . CAD (coronary artery disease) 07/29/2014  . Disorder of kidney and ureter 04/09/2014    Past Surgical History:  Procedure Laterality Date  . CARDIAC CATHETERIZATION  1980's  . IMPLANTABLE CARDIOVERTER DEFIBRILLATOR IMPLANT  07/21/2010  . IR GENERIC HISTORICAL  12/23/2014  IR RADIOLOGIST EVAL & MGMT 12/23/2014 [name], MD [facility] INTERV RAD  . IR GENERIC HISTORICAL  07/20/2016   IR RADIOLOGIST EVAL & MGMT 07/20/2016 [facility] INTERV RAD  . IR RADIOLOGIST EVAL & MGMT  07/04/2017  . RENAL CRYOABLATION Left 05/2014        Home Medications    Prior to Admission medications   Medication Sig Start Date End Date Taking? Authorizing Provider  amiodarone (PACERONE) 200 MG tablet Take 200 mg  by mouth daily.    [provider]  aspirin 81 MG chewable tablet Chew 81 mg by mouth.    [provider]  benzonatate (TESSALON) 100 MG capsule Take 1 capsule (100 mg total) by mouth every 8 (eight) hours. 08/15/17   Palumbo, April, MD  budesonide-formoterol [facility]) 160-4.5 MCG/ACT inhaler Inhale into the lungs. 09/27/17   [provider]  carvedilol (COREG) 3.125 MG tablet Take 1 tablet (3.125 mg total) by mouth 2 (two) times daily with a meal. 05/05/18   Bhagat, Bhavinkumar, PA  esomeprazole (NEXIUM) 20 MG capsule Take 20 mg by mouth daily as needed (FOR HEARTBURN).    [provider]  Fluticasone-Salmeterol (ADVAIR HFA IN) Inhale into the lungs.    [provider]  furosemide (LASIX) 20 MG tablet Take 30 mg by mouth 2 (two) times daily.    [provider]  JARDIANCE 10 MG TABS tablet Take 10 mg by mouth daily. 03/06/18   [provider]  omeprazole (PRILOSEC) 20 MG capsule Take 20 mg by mouth daily.    [provider]  sucralfate (CARAFATE) 1 g tablet Take 1 tablet (1 g total) by mouth 4 (four) times daily -  with meals and at bedtime. 06/11/18   Julianne Rice, MD  tamsulosin (FLOMAX) 0.4 MG CAPS capsule Take 0.4 mg by mouth.    [provider]  torsemide (DEMADEX) 20 MG tablet Take 1.5 tablet (30mg ) in AM and 1 table (20mg ) in PM daily 05/05/18   Bhagat, [place], PA  warfarin (COUMADIN) 5 MG tablet Take 2.5 mg by mouth daily. Take 2.5 mg daily except 5 mg Thursday    [provider]    Family History Family History  Problem Relation Age of Onset  . Heart disease Father   . Heart attack Father 41  . Hypertension Father   . Hypertension Mother     Social History Social History   Tobacco Use  . Smoking status: Former Smoker    Packs/day: 0.75    Years: 17.00    Pack years: 12.75    Types: Cigarettes    Start date: 05/08/1959    Last attempt to quit: 06/20/1976    Years since quitting: 42.4    . Smokeless tobacco: Never Used  Substance Use Topics  . Alcohol use: No  . Drug use: No     Allergies   Patient has no known allergies.   Review of Systems Review of Systems  Constitutional: Negative for chills, diaphoresis, fatigue and fever.  HENT: Negative for congestion, rhinorrhea and sneezing.   Eyes: Negative.   Respiratory: Positive for cough and shortness of breath. Negative for chest tightness.   Cardiovascular: Negative for chest pain and leg swelling.  Gastrointestinal: Negative for abdominal pain, blood in stool, diarrhea, nausea and vomiting.  Genitourinary: Negative for difficulty urinating, flank pain, frequency and hematuria.  Musculoskeletal: Negative for arthralgias and back pain.  Skin: Negative for rash.  Neurological: Negative for dizziness, speech difficulty, weakness, numbness and headaches.  Physical Exam Updated Vital Signs BP 114/82 (BP Location: Right Arm)   Pulse 68   Temp 97.6 F (36.4 C) (Oral)   Resp 20   Ht 6' 1.5" (1.867 m)   Wt 92.5 kg   SpO2 100%   BMI 26.55 kg/m   Physical Exam Constitutional:      Appearance: He is well-developed.  HENT:     Head: Normocephalic and atraumatic.  Eyes:     Pupils: Pupils are equal, round, and reactive to light.  Neck:     Musculoskeletal: Normal range of motion and neck supple.  Cardiovascular:     Rate and Rhythm: Normal rate and regular rhythm.     Heart sounds: Murmur present.  Pulmonary:     Effort: Pulmonary effort is normal. No respiratory distress.     Breath sounds: Normal breath sounds. No wheezing or rales.  Chest:     Chest wall: No tenderness.  Abdominal:     General: Bowel sounds are normal.     Palpations: Abdomen is soft.     Tenderness: There is no abdominal tenderness. There is no guarding or rebound.  Musculoskeletal: Normal range of motion.     Right lower leg: Edema present.     Left lower leg: Edema present.     Comments: 2-3+ pitting edema bilaterally   Lymphadenopathy:     Cervical: No cervical adenopathy.  Skin:    General: Skin is warm and dry.     Findings: No rash.  Neurological:     Mental Status: He is alert and oriented to person, place, and time.      ED Treatments / Results  Labs (all labs ordered are listed, but only abnormal results are displayed) Labs Reviewed  BASIC METABOLIC PANEL - Abnormal; Notable for the following components:      Result Value   Potassium 3.2 (*)    Glucose, Bld 105 (*)    BUN 37 (*)    Creatinine, Ser 2.24 (*)    Calcium 8.8 (*)    GFR calc non Af Amer 27 (*)    GFR calc Af Amer 32 (*)    All other components within normal limits  CBC WITH DIFFERENTIAL/PLATELET - Abnormal; Notable for the following components:   MCH 25.1 (*)    RDW 21.7 (*)    Platelets 122 (*)    All other components within normal limits  BRAIN NATRIURETIC PEPTIDE - Abnormal; Notable for the following components:   B Natriuretic Peptide >4,500.0 (*)    All other components within normal limits  PROTIME-INR - Abnormal; Notable for the following components:   Prothrombin Time 35.4 (*)    All other components within normal limits    EKG EKG Interpretation  Date/Time:  Friday November 22 2018 15:40:23 EST Ventricular Rate:  70 PR Interval:    QRS Duration: 174 QT Interval:  516 QTC Calculation: 557 R Axis:   -75 Text Interpretation:  Atrial-ventricular dual-paced rhythm No further analysis attempted due to paced rhythm Baseline wander in lead(s) I II III aVR aVF V2 V5 V6 Confirmed by Malvin Johns [phone]) on 11/22/2018 3:45:46 PM   Radiology Dg Chest 2 View  Result Date: 11/22/2018 CLINICAL DATA:  Acute shortness of breath beginning last evening. Congestive heart failure. EXAM: CHEST - 2 VIEW COMPARISON:  Two-view chest x-ray 10/01/2018 FINDINGS: The heart is enlarged. Mild pulmonary vascular congestion similar the prior study. Right greater than left pleural effusion is again seen. Right basilar airspace  disease  is noted. Pacing and defibrillator wires are stable. IMPRESSION: 1. Cardiomegaly and moderate pulmonary vascular congestion. 2. Similar appearance of right greater than left pleural effusion associated airspace disease. Infection is not excluded. Electronically Signed   By: San Morelle M.D.   On: 11/22/2018 15:42    Procedures Procedures (including critical care time)  Medications Ordered in ED Medications  albuterol (PROVENTIL) (2.5 MG/3ML) 0.083% nebulizer solution 5 mg (5 mg Nebulization Given 11/22/18 1545)  furosemide (LASIX) injection 60 mg (60 mg Intravenous Given 11/22/18 1621)     Initial Impression / Assessment and Plan / ED Course  I have reviewed the triage vital signs and the nursing notes.  Pertinent labs & imaging results that were available during my care of the patient were reviewed by me and considered in my medical decision making (see chart for details).     Patient is a 77 year old male who presents with shortness of breath and symptoms consistent with fluid overload.  He has no associated chest pain.  His BNP is markedly elevated but similar to prior recent values.  His chest x-ray shows some mild vascular congestion.  His creatinine is elevated but similar to baseline values.  His other labs are non-concerning.  He was given dose of IV Lasix and feels much better.  He says he is ready to go home.  He is maintaining his normal oxygen saturations on his baseline oxygen.  He has no tachypnea or increased work of breathing.  He was discharged home in good condition.  He was encouraged to follow-up with his cardiologist.  Return precautions were given.  Final Clinical Impressions(s) / ED Diagnoses   Final diagnoses:  Congestive heart failure, unspecified HF chronicity, unspecified heart failure type [facility])    ED Discharge Orders    None       Malvin Johns, MD 11/22/18 Einar Crow

## 2018-12-04 ENCOUNTER — Emergency Department (HOSPITAL_BASED_OUTPATIENT_CLINIC_OR_DEPARTMENT_OTHER): Payer: Medicare Other

## 2018-12-04 ENCOUNTER — Encounter (HOSPITAL_BASED_OUTPATIENT_CLINIC_OR_DEPARTMENT_OTHER): Payer: Self-pay

## 2018-12-04 ENCOUNTER — Emergency Department (HOSPITAL_BASED_OUTPATIENT_CLINIC_OR_DEPARTMENT_OTHER)
Admission: EM | Admit: 2018-12-04 | Discharge: 2018-12-05 | Disposition: A | Discharge status: Home / Self Care | Payer: Medicare Other | Attending: Emergency Medicine | Admitting: Emergency Medicine

## 2018-12-04 ENCOUNTER — Other Ambulatory Visit: Payer: Self-pay

## 2018-12-04 DIAGNOSIS — Z7901 Long term (current) use of anticoagulants: Secondary | ICD-10-CM | POA: Insufficient documentation

## 2018-12-04 DIAGNOSIS — Z85528 Personal history of other malignant neoplasm of kidney: Secondary | ICD-10-CM | POA: Insufficient documentation

## 2018-12-04 DIAGNOSIS — Z7982 Long term (current) use of aspirin: Secondary | ICD-10-CM | POA: Insufficient documentation

## 2018-12-04 DIAGNOSIS — Z87891 Personal history of nicotine dependence: Secondary | ICD-10-CM | POA: Diagnosis not present

## 2018-12-04 DIAGNOSIS — I110 Hypertensive heart disease with heart failure: Secondary | ICD-10-CM | POA: Diagnosis not present

## 2018-12-04 DIAGNOSIS — I2510 Atherosclerotic heart disease of native coronary artery without angina pectoris: Secondary | ICD-10-CM | POA: Diagnosis not present

## 2018-12-04 DIAGNOSIS — Z8673 Personal history of transient ischemic attack (TIA), and cerebral infarction without residual deficits: Secondary | ICD-10-CM | POA: Insufficient documentation

## 2018-12-04 DIAGNOSIS — R0602 Shortness of breath: Secondary | ICD-10-CM | POA: Diagnosis present

## 2018-12-04 DIAGNOSIS — J449 Chronic obstructive pulmonary disease, unspecified: Secondary | ICD-10-CM | POA: Diagnosis not present

## 2018-12-04 DIAGNOSIS — Z95810 Presence of automatic (implantable) cardiac defibrillator: Secondary | ICD-10-CM | POA: Diagnosis not present

## 2018-12-04 DIAGNOSIS — Z79899 Other long term (current) drug therapy: Secondary | ICD-10-CM | POA: Diagnosis not present

## 2018-12-04 DIAGNOSIS — I509 Heart failure, unspecified: Secondary | ICD-10-CM

## 2018-12-04 LAB — CBC
HEMATOCRIT: 41.5 % (ref 39.0–52.0)
Hemoglobin: 13.4 g/dL (ref 13.0–17.0)
MCH: 26.1 pg (ref 26.0–34.0)
MCHC: 32.3 g/dL (ref 30.0–36.0)
MCV: 80.7 fL (ref 80.0–100.0)
NRBC: 0 % (ref 0.0–0.2)
Platelets: 98 10*3/uL — ABNORMAL LOW (ref 150–400)
RBC: 5.14 MIL/uL (ref 4.22–5.81)
RDW: 21.8 % — ABNORMAL HIGH (ref 11.5–15.5)
WBC: 4.8 10*3/uL (ref 4.0–10.5)

## 2018-12-04 LAB — COMPREHENSIVE METABOLIC PANEL
ALT: 22 U/L (ref 0–44)
AST: 48 U/L — AB (ref 15–41)
Albumin: 3.3 g/dL — ABNORMAL LOW (ref 3.5–5.0)
Alkaline Phosphatase: 165 U/L — ABNORMAL HIGH (ref 38–126)
Anion gap: 11 (ref 5–15)
BILIRUBIN TOTAL: 3 mg/dL — AB (ref 0.3–1.2)
BUN: 31 mg/dL — AB (ref 8–23)
CO2: 27 mmol/L (ref 22–32)
CREATININE: 1.77 mg/dL — AB (ref 0.61–1.24)
Calcium: 8.9 mg/dL (ref 8.9–10.3)
Chloride: 99 mmol/L (ref 98–111)
GFR calc Af Amer: 42 mL/min — ABNORMAL LOW (ref >60)
GFR calc non Af Amer: 36 mL/min — ABNORMAL LOW (ref >60)
GLUCOSE: 116 mg/dL — AB (ref 70–99)
Potassium: 3.5 mmol/L (ref 3.5–5.1)
Sodium: 137 mmol/L (ref 135–145)
TOTAL PROTEIN: 7.4 g/dL (ref 6.5–8.1)

## 2018-12-04 LAB — TROPONIN I: Troponin I: 0.06 ng/mL (ref <0.03)

## 2018-12-04 LAB — BRAIN NATRIURETIC PEPTIDE: B Natriuretic Peptide: >4500 pg/mL — ABNORMAL HIGH (ref 0.0–100.0)

## 2018-12-04 LAB — PROTIME-INR
INR: 2.3
Prothrombin Time: 24.9 seconds — ABNORMAL HIGH (ref 11.4–15.2)

## 2018-12-04 MED ORDER — FUROSEMIDE 10 MG/ML IJ SOLN
40.0000 mg | Freq: Once | INTRAMUSCULAR | Status: AC
  Administered 2018-12-04: 40 mg via INTRAVENOUS
  Filled 2018-12-04: qty 4

## 2018-12-04 NOTE — ED Notes (Signed)
Pt ambulated around department with RN. Pt had steady gate. Pt reports feeling better.

## 2018-12-04 NOTE — ED Triage Notes (Signed)
Pt presents with SOB- dependent on 2L O2 at home. Swelling to bilat legs. Pt reports symptoms got worse after eating today.

## 2018-12-04 NOTE — ED Provider Notes (Signed)
[facility] EMERGENCY DEPARTMENT Provider Note   CSN: [id number] Arrival date & time: 12/04/18  1720     History   Chief Complaint Chief Complaint  Patient presents with  . Shortness of Breath    HPI Spencer Rice is a 78 y.o. male.  The history is provided by the patient.  Shortness of Breath  This is a recurrent problem. The average episode lasts 3 hours. The problem occurs continuously.The current episode started 3 to 5 hours ago. The problem has been gradually improving. Associated symptoms include cough, orthopnea and leg swelling. Pertinent negatives include no fever, no neck pain, no sputum production, no wheezing, no chest pain and no vomiting. Associated symptoms comments: Has not checked his weight because the batteries are not working. Precipitated by: started after eating a pork chop for lunch. Treatments tried: has taken all his lasix and not missed any doses.  Took his evening dose of lasix at 4 before coming.  He has had prior hospitalizations. Associated medical issues include CAD, heart failure and DVT. Associated medical issues comments: EF of 10% in 2016.    Past Medical History:  Diagnosis Date  . AICD (automatic cardioverter/defibrillator) present   . Cancer of left kidney ([place])    S/P OR 05/2014  . CHF (congestive heart failure) ([place])   . Coronary artery disease   . DVT (deep venous thrombosis) ([facility]) 1983   LLE  . GERD (gastroesophageal reflux disease)   . History of hiatal hernia   . Hypertension     Patient Active Problem List   Diagnosis Date Noted  . CHF (congestive heart failure) ([place]) 05/03/2018  . Acute on chronic systolic (congestive) heart failure ([place]) 05/03/2018  . Glucose intolerance (impaired glucose tolerance) 06/23/2016  . Vitreous floaters 12/08/2015  . TIA (transient ischemic attack) 12/08/2015  . SOB (shortness of breath) 12/08/2015  . Posterior vitreous detachment of both eyes 12/08/2015  . Pain due to dental caries  12/08/2015  . Other abnormal glucose 12/08/2015  . Osteoarthrosis 12/08/2015  . Long term (current) use of anticoagulants 12/08/2015  . Kidney mass 12/08/2015  . Hypersomnolence 12/08/2015  . Hyperopia with presbyopia of both eyes 12/08/2015  . Hyperlipidemia 12/08/2015  . History of orthopnea 12/08/2015  . COPD (chronic obstructive pulmonary disease) ([place]) 12/08/2015  . 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 AKI (acute kidney injury) ([place]) 12/08/2015  . Abnormal CT scan, kidney 12/08/2015  . Abdominal bloating 12/08/2015  . Biventricular ICD (implantable cardioverter-defibrillator) in place 12/07/2015  . Chest pain at rest 11/29/2015  . Essential hypertension 11/29/2015  . Acute on chronic systolic CHF (congestive heart failure), NYHA class 1 ([place]) 11/29/2015  . Gastroesophageal reflux disease without esophagitis 11/29/2015  . Pain in the chest   . Renal cancer ([place]) 08/16/2015  . 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 Cancer of kidney ([place]) 01/19/2015  . Hematuria 09/04/2014  . Psychosexual dysfunction with inhibited sexual excitement 07/29/2014  . Pelvic pain in male 07/29/2014  . Increased urinary frequency 07/29/2014  . Heartburn 07/29/2014  . EKG, abnormal 07/29/2014  . Corneal scar 07/29/2014  . 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 Cardiomyopathy ([place]) 07/29/2014  . CAD (coronary artery disease) 07/29/2014  . Disorder of kidney and ureter 04/09/2014    Past Surgical History:  Procedure Laterality Date  . CARDIAC CATHETERIZATION  1980's  . IMPLANTABLE CARDIOVERTER DEFIBRILLATOR IMPLANT  07/21/2010  . IR GENERIC HISTORICAL  12/23/2014   IR RADIOLOGIST EVAL & MGMT  12/23/2014 [name], MD [facility] INTERV RAD  . IR GENERIC HISTORICAL  07/20/2016   IR RADIOLOGIST EVAL & MGMT 07/20/2016 [facility] INTERV RAD  . IR RADIOLOGIST EVAL & MGMT  07/04/2017  . RENAL CRYOABLATION Left 05/2014        Home Medications    Prior to Admission medications     Medication Sig Start Date End Date Taking? Authorizing Provider  amiodarone (PACERONE) 200 MG tablet Take 200 mg by mouth daily.    [provider]  aspirin 81 MG chewable tablet Chew 81 mg by mouth.    [provider]  benzonatate (TESSALON) 100 MG capsule Take 1 capsule (100 mg total) by mouth every 8 (eight) hours. 08/15/17   Palumbo, April, MD  budesonide-formoterol [facility]) 160-4.5 MCG/ACT inhaler Inhale into the lungs. 09/27/17   [provider]  carvedilol (COREG) 3.125 MG tablet Take 1 tablet (3.125 mg total) by mouth 2 (two) times daily with a meal. 05/05/18   Bhagat, Bhavinkumar, PA  esomeprazole (NEXIUM) 20 MG capsule Take 20 mg by mouth daily as needed (FOR HEARTBURN).    [provider]  Fluticasone-Salmeterol (ADVAIR HFA IN) Inhale into the lungs.    [provider]  furosemide (LASIX) 20 MG tablet Take 30 mg by mouth 2 (two) times daily.    [provider]  JARDIANCE 10 MG TABS tablet Take 10 mg by mouth daily. 03/06/18   [provider]  omeprazole (PRILOSEC) 20 MG capsule Take 20 mg by mouth daily.    [provider]  sucralfate (CARAFATE) 1 g tablet Take 1 tablet (1 g total) by mouth 4 (four) times daily -  with meals and at bedtime. 06/11/18   Julianne Rice, MD  tamsulosin (FLOMAX) 0.4 MG CAPS capsule Take 0.4 mg by mouth.    [provider]  torsemide (DEMADEX) 20 MG tablet Take 1.5 tablet (30mg ) in AM and 1 table (20mg ) in PM daily 05/05/18   Bhagat, [place], PA  warfarin (COUMADIN) 5 MG tablet Take 2.5 mg by mouth daily. Take 2.5 mg daily except 5 mg Thursday    [provider]    Family History Family History  Problem Relation Age of Onset  . Heart disease Father   . Heart attack Father 108  . Hypertension Father   . Hypertension Mother     Social History Social History   Tobacco Use  . Smoking status: Former Smoker    Packs/day: 0.75    Years: 17.00    Pack years: 12.75     Types: Cigarettes    Start date: 05/08/1959    Last attempt to quit: 06/20/1976    Years since quitting: 42.4  . Smokeless tobacco: Never Used  Substance Use Topics  . Alcohol use: No  . Drug use: No     Allergies   Patient has no known allergies.   Review of Systems Review of Systems  Constitutional: Negative for fever.  Respiratory: Positive for cough and shortness of breath. Negative for sputum production and wheezing.   Cardiovascular: Positive for orthopnea and leg swelling. Negative for chest pain.  Gastrointestinal: Negative for vomiting.  Musculoskeletal: Negative for neck pain.  All other systems reviewed and are negative.    Physical Exam Updated Vital Signs BP 109/77 (BP Location: Right Arm)   Pulse 77   Temp 97.9 F (36.6 C) (Oral)   Ht 6' 1.5" (1.867 m)   Wt 92.5 kg   SpO2 91%   BMI 26.55 kg/m  Physical Exam Vitals signs and nursing note reviewed.  Constitutional:      General: He is not in acute distress.    Appearance: He is well-developed.  HENT:     Head: Normocephalic and atraumatic.  Eyes:     Conjunctiva/sclera: Conjunctivae normal.     Pupils: Pupils are equal, round, and reactive to light.  Neck:     Musculoskeletal: Normal range of motion and neck supple.     Vascular: JVD present.  Cardiovascular:     Rate and Rhythm: Normal rate and regular rhythm.     Heart sounds: Murmur present.  Pulmonary:     Effort: Pulmonary effort is normal. Tachypnea present. No respiratory distress.     Breath sounds: Examination of the right-lower field reveals decreased breath sounds. Decreased breath sounds and rales present. No wheezing.  Abdominal:     General: There is no distension.     Palpations: Abdomen is soft.     Tenderness: There is no abdominal tenderness. There is no guarding or rebound.  Musculoskeletal: Normal range of motion.        General: No tenderness.     Right lower leg: Edema present.     Left lower leg: Edema present.   Skin:    General: Skin is warm and dry.     Findings: No erythema or rash.  Neurological:     General: No focal deficit present.     Mental Status: He is alert and oriented to person, place, and time.  Psychiatric:        Behavior: Behavior normal.      ED Treatments / Results  Labs (all labs ordered are listed, but only abnormal results are displayed) Labs Reviewed  TROPONIN I - Abnormal; Notable for the following components:      Result Value   Troponin I 0.06 (*)    All other components within normal limits  BRAIN NATRIURETIC PEPTIDE - Abnormal; Notable for the following components:   B Natriuretic Peptide >4,500.0 (*)    All other components within normal limits  COMPREHENSIVE METABOLIC PANEL - Abnormal; Notable for the following components:   Glucose, Bld 116 (*)    BUN 31 (*)    Creatinine, Ser 1.77 (*)    Albumin 3.3 (*)    AST 48 (*)    Alkaline Phosphatase 165 (*)    Total Bilirubin 3.0 (*)    GFR calc non Af Amer 36 (*)    GFR calc Af Amer 42 (*)    All other components within normal limits  CBC - Abnormal; Notable for the following components:   RDW 21.8 (*)    Platelets 98 (*)    All other components within normal limits  PROTIME-INR - Abnormal; Notable for the following components:   Prothrombin Time 24.9 (*)    All other components within normal limits    EKG EKG Interpretation  Date/Time:  Wednesday December 04 2018 17:37:57 EST Ventricular Rate:  70 PR Interval:    QRS Duration: 179 QT Interval:  502 QTC Calculation: 542 R Axis:   -70 Text Interpretation:  Atrial-ventricular dual-paced rhythm No further analysis attempted due to paced rhythm Baseline wander in lead(s) V6 No significant change since last tracing Confirmed by Blanchie Dessert [phone]) on 12/04/2018 5:50:38 PM   Radiology Dg Chest 2 View  Result Date: 12/04/2018 CLINICAL DATA:  78 year old with oxygen dependent CHF, presenting with BILATERAL LOWER extremity swelling and acute  onset of shortness of breath. EXAM: CHEST -  2 VIEW COMPARISON:  11/22/2018 and earlier. FINDINGS: Cardiac silhouette massively enlarged, unchanged. LEFT subclavian biventricular pacing defibrillator unchanged. Thoracic aorta mildly atherosclerotic, unchanged. Hilar and mediastinal contours otherwise unremarkable. Chronic RIGHT pleural effusion and associated chronic RIGHT LOWER LOBE atelectasis, unchanged dating back 05/03/2008. Lungs otherwise clear. Mild pulmonary venous hypertension without overt edema. No LEFT pleural effusion. Degenerative changes and DISH involving the thoracic spine. IMPRESSION: Stable massive cardiomegaly without pulmonary edema. Stable chronic RIGHT pleural effusion and associated chronic RIGHT LOWER LOBE atelectasis. No new/acute cardiopulmonary disease. Electronically Signed   By: Evangeline Dakin M.D.   On: 12/04/2018 18:51    Procedures Procedures (including critical care time)  Medications Ordered in ED Medications - No data to display   Initial Impression / Assessment and Plan / ED Course  I have reviewed the triage vital signs and the nursing notes.  Pertinent labs & imaging results that were available during my care of the patient were reviewed by me and considered in my medical decision making (see chart for details).    Patient is a 78 year old male presenting today with sudden onset of shortness of breath after eating pork chops for lunch.  He has a significant history of cardiomyopathy, CHF, coronary artery disease and prior DVT.  He denies any infectious symptoms and on exam patient appears fluid overloaded with JVD, decreased breath sounds in the right lower lobe and diffuse edema.  Patient's weight today is up approximately 2-3 pounds from the last time he was here.  He is tachypneic but able to speak in full sentences.  He denies any chest pain and he has had no defibrillator discharge.  He has taken his extra dose of Lasix today.  Chest x-ray, CBC, CMP, BNP,  troponin pending.  Patient does not need BiPAP at this time.  7:07 PM Patient's labs today are at his baseline with chronic troponin leak, BNP of greater than 4500, chronic right-sided pleural effusion that has not changed significantly, stable hemoglobin and creatinine slightly improved from baseline.  Baseline is typically to today's 1.77.  Patient was given IV Lasix and will see if he has diuresis and improvement in his symptoms.  11:53 PM Pt has had about 44mL out and feeling better.  Desiring to go home.  Will continue his normal meds and avoid salty foods.  Will return if symptoms worsen.   Final Clinical Impressions(s) / ED Diagnoses   Final diagnoses:  Acute on chronic congestive heart failure, unspecified heart failure type [facility])    ED Discharge Orders    None       Blanchie Dessert, MD 12/04/18 2354

## 2018-12-04 NOTE — Discharge Instructions (Signed)
Take your normal medications as usual in the morning.

## 2018-12-28 ENCOUNTER — Emergency Department (HOSPITAL_BASED_OUTPATIENT_CLINIC_OR_DEPARTMENT_OTHER): Payer: Medicare Other

## 2018-12-28 ENCOUNTER — Other Ambulatory Visit: Payer: Self-pay

## 2018-12-28 ENCOUNTER — Emergency Department (HOSPITAL_BASED_OUTPATIENT_CLINIC_OR_DEPARTMENT_OTHER)
Admission: EM | Admit: 2018-12-28 | Discharge: 2018-12-28 | Disposition: A | Discharge status: Home / Self Care | Payer: Medicare Other | Source: Home / Self Care | Attending: Emergency Medicine | Admitting: Emergency Medicine

## 2018-12-28 ENCOUNTER — Encounter (HOSPITAL_BASED_OUTPATIENT_CLINIC_OR_DEPARTMENT_OTHER): Payer: Self-pay

## 2018-12-28 DIAGNOSIS — E440 Moderate protein-calorie malnutrition: Secondary | ICD-10-CM | POA: Diagnosis not present

## 2018-12-28 DIAGNOSIS — K219 Gastro-esophageal reflux disease without esophagitis: Secondary | ICD-10-CM | POA: Diagnosis present

## 2018-12-28 DIAGNOSIS — Z8249 Family history of ischemic heart disease and other diseases of the circulatory system: Secondary | ICD-10-CM

## 2018-12-28 DIAGNOSIS — J449 Chronic obstructive pulmonary disease, unspecified: Secondary | ICD-10-CM | POA: Diagnosis present

## 2018-12-28 DIAGNOSIS — K449 Diaphragmatic hernia without obstruction or gangrene: Secondary | ICD-10-CM | POA: Diagnosis present

## 2018-12-28 DIAGNOSIS — Z8673 Personal history of transient ischemic attack (TIA), and cerebral infarction without residual deficits: Secondary | ICD-10-CM

## 2018-12-28 DIAGNOSIS — E876 Hypokalemia: Secondary | ICD-10-CM | POA: Diagnosis not present

## 2018-12-28 DIAGNOSIS — R570 Cardiogenic shock: Secondary | ICD-10-CM | POA: Diagnosis present

## 2018-12-28 DIAGNOSIS — I428 Other cardiomyopathies: Secondary | ICD-10-CM | POA: Diagnosis present

## 2018-12-28 DIAGNOSIS — I5023 Acute on chronic systolic (congestive) heart failure: Secondary | ICD-10-CM | POA: Insufficient documentation

## 2018-12-28 DIAGNOSIS — R2243 Localized swelling, mass and lump, lower limb, bilateral: Secondary | ICD-10-CM | POA: Insufficient documentation

## 2018-12-28 DIAGNOSIS — Z79899 Other long term (current) drug therapy: Secondary | ICD-10-CM

## 2018-12-28 DIAGNOSIS — I130 Hypertensive heart and chronic kidney disease with heart failure and stage 1 through stage 4 chronic kidney disease, or unspecified chronic kidney disease: Principal | ICD-10-CM | POA: Diagnosis present

## 2018-12-28 DIAGNOSIS — D6832 Hemorrhagic disorder due to extrinsic circulating anticoagulants: Secondary | ICD-10-CM | POA: Diagnosis not present

## 2018-12-28 DIAGNOSIS — Z7901 Long term (current) use of anticoagulants: Secondary | ICD-10-CM | POA: Insufficient documentation

## 2018-12-28 DIAGNOSIS — I2510 Atherosclerotic heart disease of native coronary artery without angina pectoris: Secondary | ICD-10-CM | POA: Insufficient documentation

## 2018-12-28 DIAGNOSIS — I5084 End stage heart failure: Secondary | ICD-10-CM | POA: Diagnosis present

## 2018-12-28 DIAGNOSIS — E1122 Type 2 diabetes mellitus with diabetic chronic kidney disease: Secondary | ICD-10-CM | POA: Diagnosis present

## 2018-12-28 DIAGNOSIS — D62 Acute posthemorrhagic anemia: Secondary | ICD-10-CM | POA: Diagnosis not present

## 2018-12-28 DIAGNOSIS — Z7982 Long term (current) use of aspirin: Secondary | ICD-10-CM | POA: Insufficient documentation

## 2018-12-28 DIAGNOSIS — R0602 Shortness of breath: Secondary | ICD-10-CM | POA: Diagnosis not present

## 2018-12-28 DIAGNOSIS — Z85528 Personal history of other malignant neoplasm of kidney: Secondary | ICD-10-CM

## 2018-12-28 DIAGNOSIS — I081 Rheumatic disorders of both mitral and tricuspid valves: Secondary | ICD-10-CM | POA: Diagnosis present

## 2018-12-28 DIAGNOSIS — Z87891 Personal history of nicotine dependence: Secondary | ICD-10-CM

## 2018-12-28 DIAGNOSIS — B961 Klebsiella pneumoniae [K. pneumoniae] as the cause of diseases classified elsewhere: Secondary | ICD-10-CM | POA: Diagnosis not present

## 2018-12-28 DIAGNOSIS — I110 Hypertensive heart disease with heart failure: Secondary | ICD-10-CM

## 2018-12-28 DIAGNOSIS — Z9981 Dependence on supplemental oxygen: Secondary | ICD-10-CM

## 2018-12-28 DIAGNOSIS — N390 Urinary tract infection, site not specified: Secondary | ICD-10-CM | POA: Diagnosis not present

## 2018-12-28 DIAGNOSIS — Z95810 Presence of automatic (implantable) cardiac defibrillator: Secondary | ICD-10-CM

## 2018-12-28 DIAGNOSIS — I5082 Biventricular heart failure: Secondary | ICD-10-CM | POA: Diagnosis present

## 2018-12-28 DIAGNOSIS — Z86718 Personal history of other venous thrombosis and embolism: Secondary | ICD-10-CM

## 2018-12-28 DIAGNOSIS — K5900 Constipation, unspecified: Secondary | ICD-10-CM | POA: Diagnosis not present

## 2018-12-28 DIAGNOSIS — R911 Solitary pulmonary nodule: Secondary | ICD-10-CM | POA: Diagnosis present

## 2018-12-28 DIAGNOSIS — Z6823 Body mass index (BMI) 23.0-23.9, adult: Secondary | ICD-10-CM

## 2018-12-28 DIAGNOSIS — I471 Supraventricular tachycardia: Secondary | ICD-10-CM | POA: Diagnosis not present

## 2018-12-28 DIAGNOSIS — E785 Hyperlipidemia, unspecified: Secondary | ICD-10-CM | POA: Diagnosis present

## 2018-12-28 DIAGNOSIS — R58 Hemorrhage, not elsewhere classified: Secondary | ICD-10-CM | POA: Diagnosis not present

## 2018-12-28 DIAGNOSIS — Z7951 Long term (current) use of inhaled steroids: Secondary | ICD-10-CM

## 2018-12-28 DIAGNOSIS — N184 Chronic kidney disease, stage 4 (severe): Secondary | ICD-10-CM | POA: Diagnosis present

## 2018-12-28 DIAGNOSIS — I272 Pulmonary hypertension, unspecified: Secondary | ICD-10-CM | POA: Diagnosis present

## 2018-12-28 DIAGNOSIS — I447 Left bundle-branch block, unspecified: Secondary | ICD-10-CM | POA: Diagnosis present

## 2018-12-28 DIAGNOSIS — R339 Retention of urine, unspecified: Secondary | ICD-10-CM | POA: Diagnosis not present

## 2018-12-28 LAB — CBC
HCT: 43.8 % (ref 39.0–52.0)
Hemoglobin: 13.9 g/dL (ref 13.0–17.0)
MCH: 25.8 pg — ABNORMAL LOW (ref 26.0–34.0)
MCHC: 31.7 g/dL (ref 30.0–36.0)
MCV: 81.3 fL (ref 80.0–100.0)
PLATELETS: 139 10*3/uL — AB (ref 150–400)
RBC: 5.39 MIL/uL (ref 4.22–5.81)
RDW: 21.2 % — ABNORMAL HIGH (ref 11.5–15.5)
WBC: 5.7 10*3/uL (ref 4.0–10.5)
nRBC: 0 % (ref 0.0–0.2)

## 2018-12-28 LAB — BASIC METABOLIC PANEL
Anion gap: 10 (ref 5–15)
BUN: 33 mg/dL — ABNORMAL HIGH (ref 8–23)
CALCIUM: 8.8 mg/dL — AB (ref 8.9–10.3)
CO2: 29 mmol/L (ref 22–32)
Chloride: 98 mmol/L (ref 98–111)
Creatinine, Ser: 1.99 mg/dL — ABNORMAL HIGH (ref 0.61–1.24)
GFR calc Af Amer: 36 mL/min — ABNORMAL LOW (ref >60)
GFR calc non Af Amer: 31 mL/min — ABNORMAL LOW (ref >60)
Glucose, Bld: 131 mg/dL — ABNORMAL HIGH (ref 70–99)
Potassium: 3.7 mmol/L (ref 3.5–5.1)
Sodium: 137 mmol/L (ref 135–145)

## 2018-12-28 LAB — PROTIME-INR
INR: 2.75
Prothrombin Time: 28.7 seconds — ABNORMAL HIGH (ref 11.4–15.2)

## 2018-12-28 LAB — TROPONIN I: Troponin I: 0.05 ng/mL (ref <0.03)

## 2018-12-28 LAB — BRAIN NATRIURETIC PEPTIDE

## 2018-12-28 MED ORDER — FUROSEMIDE 10 MG/ML IJ SOLN
80.0000 mg | Freq: Once | INTRAMUSCULAR | Status: AC
  Administered 2018-12-28: 80 mg via INTRAVENOUS
  Filled 2018-12-28: qty 8

## 2018-12-28 NOTE — Discharge Instructions (Signed)
Be sure to follow-up closely with your primary care physician.  If you develop new or worsening trouble breathing, chest pain, fever, vomiting, or any other new/concerning symptoms then return to the ER for evaluation.

## 2018-12-28 NOTE — ED Triage Notes (Signed)
Pt presents with SOB- dependent on 2L O2 at home. Swelling to bilat legs. Hx of same earlier this month.

## 2018-12-28 NOTE — ED Notes (Signed)
Troponin 0.05, results given to ED MD and Wilburn Cornelia

## 2018-12-28 NOTE — ED Notes (Signed)
Patient transported to X-ray 

## 2018-12-28 NOTE — ED Provider Notes (Signed)
[facility] EMERGENCY DEPARTMENT Provider Note   CSN: [id number] Arrival date & time: 12/28/18  1738     History   Chief Complaint Chief Complaint  Patient presents with  . Shortness of Breath    HPI Darivs Lunden is a 78 y.o. male.  HPI 78 year old male presents with dyspnea.  This is a recurrent problem for him.  He is chronically on oxygen.  He states usually when this happens he needs an IV dose of Lasix and he will feel better.  Dyspnea recurred last night.  There is no cough, chest pain, fever.  He has chronic bilateral lower extremity swelling and states that it may be a little worse than typical but he is chronically in Unna boots.  He reports compliance with his medicines.  Past Medical History:  Diagnosis Date  . AICD (automatic cardioverter/defibrillator) present   . 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 CHF (congestive heart failure) ([place])   . Coronary artery disease   . DVT (deep venous thrombosis) ([facility]) 1983   LLE  . GERD (gastroesophageal reflux disease)   . History of hiatal hernia   . Hypertension     Patient Active Problem List   Diagnosis Date Noted  . CHF (congestive heart failure) ([place]) 05/03/2018  . Acute on chronic systolic (congestive) heart failure ([place]) 05/03/2018  . Glucose intolerance (impaired glucose tolerance) 06/23/2016  . Vitreous floaters 12/08/2015  . TIA (transient ischemic attack) 12/08/2015  . SOB (shortness of breath) 12/08/2015  . Posterior vitreous detachment of both eyes 12/08/2015  . Pain due to dental caries 12/08/2015  . Other abnormal glucose 12/08/2015  . Osteoarthrosis 12/08/2015  . Long term (current) use of anticoagulants 12/08/2015  . Kidney mass 12/08/2015  . Hypersomnolence 12/08/2015  . Hyperopia with presbyopia of both eyes 12/08/2015  . Hyperlipidemia 12/08/2015  . History of orthopnea 12/08/2015  . COPD (chronic obstructive pulmonary disease) ([place]) 12/08/2015  . Apical mural thrombus  without MI 12/08/2015  . AKI (acute kidney injury) ([place]) 12/08/2015  . Abnormal CT scan, kidney 12/08/2015  . Abdominal bloating 12/08/2015  . Biventricular ICD (implantable cardioverter-defibrillator) in place 12/07/2015  . Chest pain at rest 11/29/2015  . Essential hypertension 11/29/2015  . Acute on chronic systolic CHF (congestive heart failure), NYHA class 1 ([place]) 11/29/2015  . Gastroesophageal reflux disease without esophagitis 11/29/2015  . Pain in the chest   . Renal cancer ([place]) 08/16/2015  . 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 Cancer of kidney ([place]) 01/19/2015  . Hematuria 09/04/2014  . Psychosexual dysfunction with inhibited sexual excitement 07/29/2014  . Pelvic pain in male 07/29/2014  . Increased urinary frequency 07/29/2014  . Heartburn 07/29/2014  . EKG, abnormal 07/29/2014  . Corneal scar 07/29/2014  . 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 Cardiomyopathy ([place]) 07/29/2014  . CAD (coronary artery disease) 07/29/2014  . Disorder of kidney and ureter 04/09/2014    Past Surgical History:  Procedure Laterality Date  . CARDIAC CATHETERIZATION  1980's  . IMPLANTABLE CARDIOVERTER DEFIBRILLATOR IMPLANT  07/21/2010  . IR GENERIC HISTORICAL  12/23/2014   IR RADIOLOGIST EVAL & MGMT 12/23/2014 [name], MD [facility] INTERV RAD  . IR GENERIC HISTORICAL  07/20/2016   IR RADIOLOGIST EVAL & MGMT 07/20/2016 [facility] INTERV RAD  . IR RADIOLOGIST EVAL & MGMT  07/04/2017  . RENAL CRYOABLATION Left 05/2014        Home Medications    Prior to Admission medications  Medication Sig Start Date End Date Taking? Authorizing Provider  amiodarone (PACERONE) 200 MG tablet Take 200 mg by mouth daily.    [provider]  aspirin 81 MG chewable tablet Chew 81 mg by mouth.    [provider]  benzonatate (TESSALON) 100 MG capsule Take 1 capsule (100 mg total) by mouth every 8 (eight) hours. 08/15/17   Palumbo, April, MD  budesonide-formoterol [facility])  160-4.5 MCG/ACT inhaler Inhale into the lungs. 09/27/17   [provider]  carvedilol (COREG) 3.125 MG tablet Take 1 tablet (3.125 mg total) by mouth 2 (two) times daily with a meal. 05/05/18   Bhagat, Bhavinkumar, PA  esomeprazole (NEXIUM) 20 MG capsule Take 20 mg by mouth daily as needed (FOR HEARTBURN).    [provider]  Fluticasone-Salmeterol (ADVAIR HFA IN) Inhale into the lungs.    [provider]  furosemide (LASIX) 20 MG tablet Take 30 mg by mouth 2 (two) times daily.    [provider]  JARDIANCE 10 MG TABS tablet Take 10 mg by mouth daily. 03/06/18   [provider]  omeprazole (PRILOSEC) 20 MG capsule Take 20 mg by mouth daily.    [provider]  sucralfate (CARAFATE) 1 g tablet Take 1 tablet (1 g total) by mouth 4 (four) times daily -  with meals and at bedtime. 06/11/18   Julianne Rice, MD  tamsulosin (FLOMAX) 0.4 MG CAPS capsule Take 0.4 mg by mouth.    [provider]  torsemide (DEMADEX) 20 MG tablet Take 1.5 tablet (30mg ) in AM and 1 table (20mg ) in PM daily 05/05/18   Bhagat, [place], PA  warfarin (COUMADIN) 5 MG tablet Take 2.5 mg by mouth daily. Take 2.5 mg daily except 5 mg Thursday    [provider]    Family History Family History  Problem Relation Age of Onset  . Heart disease Father   . Heart attack Father 10  . Hypertension Father   . Hypertension Mother     Social History Social History   Tobacco Use  . Smoking status: Former Smoker    Packs/day: 0.75    Years: 17.00    Pack years: 12.75    Types: Cigarettes    Start date: 05/08/1959    Last attempt to quit: 06/20/1976    Years since quitting: 42.5  . Smokeless tobacco: Never Used  Substance Use Topics  . Alcohol use: No  . Drug use: No     Allergies   Patient has no known allergies.   Review of Systems Review of Systems  Constitutional: Negative for fever.  Respiratory: Positive for shortness of breath. Negative for  cough.   Cardiovascular: Positive for leg swelling. Negative for chest pain.  Gastrointestinal: Negative for abdominal pain.  All other systems reviewed and are negative.    Physical Exam Updated Vital Signs BP 101/70   Pulse 70   Temp 98.6 F (37 C)   Resp 12   Ht 6\' 1"  (1.854 m)   Wt 93.4 kg   SpO2 100%   BMI 27.18 kg/m   Physical Exam Vitals signs and nursing note reviewed.  Constitutional:      General: He is not in acute distress.    Appearance: He is well-developed. He is not ill-appearing, toxic-appearing or diaphoretic.  HENT:     Head: Normocephalic and atraumatic.     Right Ear: External ear normal.     Left Ear: External ear normal.     Nose: Nose normal.  Eyes:     General:        Right eye: No discharge.        Left eye: No discharge.  Neck:     Musculoskeletal: Neck supple.     Vascular: JVD present.  Cardiovascular:     Rate and Rhythm: Normal rate and regular rhythm.     Heart sounds: Normal heart sounds.  Pulmonary:     Effort: Pulmonary effort is normal. No accessory muscle usage.     Breath sounds: Examination of the right-lower field reveals decreased breath sounds and rales. Decreased breath sounds and rales present.  Abdominal:     Palpations: Abdomen is soft.     Tenderness: There is no abdominal tenderness.  Musculoskeletal:     Right lower leg: Edema present.     Left lower leg: Edema present.     Comments: BLE in Unna boots, there is some pitting edema to bilateral knees  Skin:    General: Skin is warm and dry.  Neurological:     Mental Status: He is alert.  Psychiatric:        Mood and Affect: Mood is not anxious.      ED Treatments / Results  Labs (all labs ordered are listed, but only abnormal results are displayed) Labs Reviewed  BASIC METABOLIC PANEL - Abnormal; Notable for the following components:      Result Value   Glucose, Bld 131 (*)    BUN 33 (*)    Creatinine, Ser 1.99 (*)    Calcium 8.8 (*)    GFR calc non  Af Amer 31 (*)    GFR calc Af Amer 36 (*)    All other components within normal limits  CBC - Abnormal; Notable for the following components:   MCH 25.8 (*)    RDW 21.2 (*)    Platelets 139 (*)    All other components within normal limits  BRAIN NATRIURETIC PEPTIDE - Abnormal; Notable for the following components:   B Natriuretic Peptide >4,925.0 (*)    All other components within normal limits  TROPONIN I - Abnormal; Notable for the following components:   Troponin I 0.05 (*)    All other components within normal limits  PROTIME-INR - Abnormal; Notable for the following components:   Prothrombin Time 28.7 (*)    All other components within normal limits    EKG EKG Interpretation  Date/Time:  Saturday December 28 2018 18:02:33 EST Ventricular Rate:  70 PR Interval:    QRS Duration: 177 QT Interval:  531 QTC Calculation: 574 R Axis:   -81 Text Interpretation:  Atrial-ventricular dual-paced rhythm No further analysis attempted due to paced rhythm no significant change since Dec 04 2018 Confirmed by Sherwood Gambler [phone]) on 12/28/2018 6:15:01 PM   Radiology Dg Chest 2 View  Result Date: 12/28/2018 CLINICAL DATA:  Worsening shortness of breath since last night. Dyspnea. CHF. EXAM: CHEST - 2 VIEW COMPARISON:  Radiograph 12/04/2018, additional priors FINDINGS: Left-sided pacemaker with leads unchanged in position. Stable prominent cardiomegaly. Right pleural effusion appears chronic with associated atelectasis, present dating back to abdominal CT 06/17/2018. Possible trace left pleural effusion. No pulmonary edema. No acute airspace disease. No pneumothorax. Unchanged osseous structures. IMPRESSION: 1. Chronic right pleural effusion with associated atelectasis. Possible trace left pleural effusion. 2. Stable marked cardiomegaly. No pulmonary edema or new focal airspace disease. Electronically Signed   By: Keith Rake M.D.   On: 12/28/2018 19:13    Procedures Procedures (including  critical care time)  Medications Ordered in ED Medications  furosemide (LASIX) injection 80 mg (80 mg Intravenous Given 12/28/18 1921)     Initial Impression / Assessment and Plan / ED Course  I have reviewed the triage vital signs and the nursing notes.  Pertinent labs & imaging results that were available during my care of the patient were reviewed by me and considered in my medical decision making (see chart for details).     Patient presents with a recurrent CHF exacerbation.  His chronic kidney disease, elevated BNP and mildly elevated troponin are all near baseline.  No obvious cause for this exacerbation today.  However he has been to the ED multiple times for similar.  I have given him IV Lasix.  Patient is later asking to go home after being observed in the ED a couple hours.  He was offered admission if he still feels short of breath that he feels like he is well enough for discharge home.  We discussed strict return precautions and need for close PCP follow-up.  Final Clinical Impressions(s) / ED Diagnoses   Final diagnoses:  Acute on chronic systolic congestive heart failure [facility])    ED Discharge Orders    None       Sherwood Gambler, MD 12/28/18 2143

## 2018-12-29 ENCOUNTER — Emergency Department (HOSPITAL_BASED_OUTPATIENT_CLINIC_OR_DEPARTMENT_OTHER): Payer: Medicare Other

## 2018-12-29 ENCOUNTER — Other Ambulatory Visit: Payer: Self-pay

## 2018-12-29 ENCOUNTER — Encounter (HOSPITAL_BASED_OUTPATIENT_CLINIC_OR_DEPARTMENT_OTHER): Payer: Self-pay | Admitting: Emergency Medicine

## 2018-12-29 ENCOUNTER — Inpatient Hospital Stay (HOSPITAL_BASED_OUTPATIENT_CLINIC_OR_DEPARTMENT_OTHER)
Admission: EM | Admit: 2018-12-29 | Discharge: 2019-01-29 | DRG: 286 | Disposition: A | Discharge status: Skilled Nursing Facility | Payer: Medicare Other | Attending: Internal Medicine | Admitting: Internal Medicine

## 2018-12-29 DIAGNOSIS — N390 Urinary tract infection, site not specified: Secondary | ICD-10-CM | POA: Diagnosis not present

## 2018-12-29 DIAGNOSIS — I471 Supraventricular tachycardia: Secondary | ICD-10-CM | POA: Diagnosis not present

## 2018-12-29 DIAGNOSIS — E440 Moderate protein-calorie malnutrition: Secondary | ICD-10-CM | POA: Diagnosis not present

## 2018-12-29 DIAGNOSIS — R0602 Shortness of breath: Secondary | ICD-10-CM | POA: Diagnosis present

## 2018-12-29 DIAGNOSIS — Z8249 Family history of ischemic heart disease and other diseases of the circulatory system: Secondary | ICD-10-CM | POA: Diagnosis not present

## 2018-12-29 DIAGNOSIS — Z7189 Other specified counseling: Secondary | ICD-10-CM

## 2018-12-29 DIAGNOSIS — Z9981 Dependence on supplemental oxygen: Secondary | ICD-10-CM | POA: Diagnosis not present

## 2018-12-29 DIAGNOSIS — I509 Heart failure, unspecified: Secondary | ICD-10-CM

## 2018-12-29 DIAGNOSIS — I428 Other cardiomyopathies: Secondary | ICD-10-CM | POA: Diagnosis present

## 2018-12-29 DIAGNOSIS — Z95810 Presence of automatic (implantable) cardiac defibrillator: Secondary | ICD-10-CM | POA: Diagnosis present

## 2018-12-29 DIAGNOSIS — I10 Essential (primary) hypertension: Secondary | ICD-10-CM | POA: Diagnosis present

## 2018-12-29 DIAGNOSIS — Z85528 Personal history of other malignant neoplasm of kidney: Secondary | ICD-10-CM | POA: Diagnosis not present

## 2018-12-29 DIAGNOSIS — Z86718 Personal history of other venous thrombosis and embolism: Secondary | ICD-10-CM | POA: Diagnosis not present

## 2018-12-29 DIAGNOSIS — I493 Ventricular premature depolarization: Secondary | ICD-10-CM

## 2018-12-29 DIAGNOSIS — Z01810 Encounter for preprocedural cardiovascular examination: Secondary | ICD-10-CM

## 2018-12-29 DIAGNOSIS — R531 Weakness: Secondary | ICD-10-CM | POA: Diagnosis not present

## 2018-12-29 DIAGNOSIS — I513 Intracardiac thrombosis, not elsewhere classified: Secondary | ICD-10-CM | POA: Diagnosis present

## 2018-12-29 DIAGNOSIS — D62 Acute posthemorrhagic anemia: Secondary | ICD-10-CM | POA: Diagnosis not present

## 2018-12-29 DIAGNOSIS — R7302 Impaired glucose tolerance (oral): Secondary | ICD-10-CM | POA: Diagnosis present

## 2018-12-29 DIAGNOSIS — R000 Tachycardia, unspecified: Secondary | ICD-10-CM | POA: Diagnosis not present

## 2018-12-29 DIAGNOSIS — Z515 Encounter for palliative care: Secondary | ICD-10-CM

## 2018-12-29 DIAGNOSIS — N183 Chronic kidney disease, stage 3 unspecified: Secondary | ICD-10-CM | POA: Diagnosis present

## 2018-12-29 DIAGNOSIS — E871 Hypo-osmolality and hyponatremia: Secondary | ICD-10-CM | POA: Diagnosis not present

## 2018-12-29 DIAGNOSIS — R0682 Tachypnea, not elsewhere classified: Secondary | ICD-10-CM | POA: Diagnosis not present

## 2018-12-29 DIAGNOSIS — Z7951 Long term (current) use of inhaled steroids: Secondary | ICD-10-CM | POA: Diagnosis not present

## 2018-12-29 DIAGNOSIS — Z87891 Personal history of nicotine dependence: Secondary | ICD-10-CM | POA: Diagnosis not present

## 2018-12-29 DIAGNOSIS — I5023 Acute on chronic systolic (congestive) heart failure: Secondary | ICD-10-CM | POA: Diagnosis present

## 2018-12-29 DIAGNOSIS — I130 Hypertensive heart and chronic kidney disease with heart failure and stage 1 through stage 4 chronic kidney disease, or unspecified chronic kidney disease: Secondary | ICD-10-CM | POA: Diagnosis present

## 2018-12-29 DIAGNOSIS — I5020 Unspecified systolic (congestive) heart failure: Secondary | ICD-10-CM

## 2018-12-29 DIAGNOSIS — E119 Type 2 diabetes mellitus without complications: Secondary | ICD-10-CM | POA: Diagnosis not present

## 2018-12-29 DIAGNOSIS — Z7982 Long term (current) use of aspirin: Secondary | ICD-10-CM | POA: Diagnosis not present

## 2018-12-29 DIAGNOSIS — J449 Chronic obstructive pulmonary disease, unspecified: Secondary | ICD-10-CM | POA: Diagnosis present

## 2018-12-29 DIAGNOSIS — Z01818 Encounter for other preprocedural examination: Secondary | ICD-10-CM

## 2018-12-29 DIAGNOSIS — E785 Hyperlipidemia, unspecified: Secondary | ICD-10-CM | POA: Diagnosis present

## 2018-12-29 DIAGNOSIS — K449 Diaphragmatic hernia without obstruction or gangrene: Secondary | ICD-10-CM | POA: Diagnosis present

## 2018-12-29 DIAGNOSIS — I2510 Atherosclerotic heart disease of native coronary artery without angina pectoris: Secondary | ICD-10-CM | POA: Diagnosis not present

## 2018-12-29 DIAGNOSIS — D6832 Hemorrhagic disorder due to extrinsic circulating anticoagulants: Secondary | ICD-10-CM | POA: Diagnosis not present

## 2018-12-29 DIAGNOSIS — Z452 Encounter for adjustment and management of vascular access device: Secondary | ICD-10-CM

## 2018-12-29 DIAGNOSIS — R570 Cardiogenic shock: Secondary | ICD-10-CM | POA: Diagnosis present

## 2018-12-29 DIAGNOSIS — N184 Chronic kidney disease, stage 4 (severe): Secondary | ICD-10-CM | POA: Diagnosis present

## 2018-12-29 DIAGNOSIS — Z7901 Long term (current) use of anticoagulants: Secondary | ICD-10-CM | POA: Diagnosis not present

## 2018-12-29 DIAGNOSIS — K219 Gastro-esophageal reflux disease without esophagitis: Secondary | ICD-10-CM | POA: Diagnosis present

## 2018-12-29 HISTORY — DX: Chronic obstructive pulmonary disease, unspecified: J44.9

## 2018-12-29 HISTORY — DX: Chronic kidney disease, unspecified: N18.9

## 2018-12-29 LAB — COMPREHENSIVE METABOLIC PANEL
ALT: 21 U/L (ref 0–44)
AST: 39 U/L (ref 15–41)
Albumin: 3.1 g/dL — ABNORMAL LOW (ref 3.5–5.0)
Alkaline Phosphatase: 111 U/L (ref 38–126)
Anion gap: 12 (ref 5–15)
BUN: 34 mg/dL — ABNORMAL HIGH (ref 8–23)
CO2: 27 mmol/L (ref 22–32)
Calcium: 8.9 mg/dL (ref 8.9–10.3)
Chloride: 98 mmol/L (ref 98–111)
Creatinine, Ser: 1.77 mg/dL — ABNORMAL HIGH (ref 0.61–1.24)
GFR calc Af Amer: 42 mL/min — ABNORMAL LOW (ref >60)
GFR calc non Af Amer: 36 mL/min — ABNORMAL LOW (ref >60)
Glucose, Bld: 113 mg/dL — ABNORMAL HIGH (ref 70–99)
Potassium: 3.9 mmol/L (ref 3.5–5.1)
Sodium: 137 mmol/L (ref 135–145)
TOTAL PROTEIN: 7.1 g/dL (ref 6.5–8.1)
Total Bilirubin: 2.8 mg/dL — ABNORMAL HIGH (ref 0.3–1.2)

## 2018-12-29 LAB — CBC WITH DIFFERENTIAL/PLATELET
BASOS ABS: 0 10*3/uL (ref 0.0–0.1)
Band Neutrophils: 0 %
Basophils Relative: 0 %
Blasts: 0 %
Eosinophils Absolute: 0.1 10*3/uL (ref 0.0–0.5)
Eosinophils Relative: 1 %
HCT: 43.1 % (ref 39.0–52.0)
HEMOGLOBIN: 13.6 g/dL (ref 13.0–17.0)
Lymphocytes Relative: 19 %
Lymphs Abs: 1.1 10*3/uL (ref 0.7–4.0)
MCH: 25.9 pg — ABNORMAL LOW (ref 26.0–34.0)
MCHC: 31.6 g/dL (ref 30.0–36.0)
MCV: 81.9 fL (ref 80.0–100.0)
Metamyelocytes Relative: 0 %
Monocytes Absolute: 0.5 10*3/uL (ref 0.1–1.0)
Monocytes Relative: 9 %
Myelocytes: 0 %
Neutro Abs: 4 10*3/uL (ref 1.7–7.7)
Neutrophils Relative %: 71 %
Other: 0 %
Platelets: 144 10*3/uL — ABNORMAL LOW (ref 150–400)
Promyelocytes Relative: 0 %
RBC: 5.26 MIL/uL (ref 4.22–5.81)
RDW: 21.3 % — ABNORMAL HIGH (ref 11.5–15.5)
WBC: 5.7 10*3/uL (ref 4.0–10.5)
nRBC: 0 % (ref 0.0–0.2)
nRBC: 0 /100 WBC

## 2018-12-29 LAB — URINALYSIS, MICROSCOPIC (REFLEX): Squamous Epithelial / HPF: NONE SEEN (ref 0–5)

## 2018-12-29 LAB — PROTIME-INR
INR: 3.33
Prothrombin Time: 33.3 seconds — ABNORMAL HIGH (ref 11.4–15.2)

## 2018-12-29 LAB — BRAIN NATRIURETIC PEPTIDE: B Natriuretic Peptide: >4500 pg/mL — ABNORMAL HIGH (ref 0.0–100.0)

## 2018-12-29 LAB — URINALYSIS, ROUTINE W REFLEX MICROSCOPIC
Bilirubin Urine: NEGATIVE
Glucose, UA: NEGATIVE mg/dL
Ketones, ur: NEGATIVE mg/dL
Nitrite: NEGATIVE
Protein, ur: NEGATIVE mg/dL
Specific Gravity, Urine: 1.015 (ref 1.005–1.030)
pH: 5 (ref 5.0–8.0)

## 2018-12-29 LAB — TROPONIN I: Troponin I: 0.05 ng/mL (ref <0.03)

## 2018-12-29 MED ORDER — SODIUM CHLORIDE 0.9% FLUSH
3.0000 mL | Freq: Once | INTRAVENOUS | Status: AC
  Administered 2019-01-26: 3 mL via INTRAVENOUS
  Filled 2018-12-29: qty 3

## 2018-12-29 MED ORDER — FUROSEMIDE 10 MG/ML IJ SOLN
40.0000 mg | Freq: Once | INTRAMUSCULAR | Status: AC
  Administered 2018-12-29: 40 mg via INTRAVENOUS
  Filled 2018-12-29: qty 4

## 2018-12-29 NOTE — ED Notes (Signed)
ED Provider at bedside. 

## 2018-12-29 NOTE — ED Notes (Signed)
Lab called and states troponin is 0.05. Dr. Sherry Ruffing aware.

## 2018-12-29 NOTE — ED Provider Notes (Signed)
[facility] EMERGENCY DEPARTMENT Provider Note   CSN: [id number] Arrival date & time: 12/29/18  1651     History   Chief Complaint Chief Complaint  Patient presents with  . Shortness of Breath    HPI Spencer Rice is a 78 y.o. male.  The history is provided by the patient and medical records. No language interpreter was used.  Shortness of Breath  Severity:  Severe Onset quality:  Gradual Duration:  3 days Timing:  Constant Progression:  Unchanged Chronicity:  Recurrent Relieved by:  Nothing Worsened by:  Nothing Ineffective treatments:  Diuretics Associated symptoms: cough   Associated symptoms: no abdominal pain, no chest pain, no diaphoresis, no fever, no headaches, no neck pain, no rash, no sputum production, no vomiting and no wheezing     Past Medical History:  Diagnosis Date  . AICD (automatic cardioverter/defibrillator) present   . Cancer of left kidney ([place])    S/P OR 05/2014  . CHF (congestive heart failure) ([place])   . Coronary artery disease   . DVT (deep venous thrombosis) ([facility]) 1983   LLE  . GERD (gastroesophageal reflux disease)   . History of hiatal hernia   . Hypertension     Patient Active Problem List   Diagnosis Date Noted  . CHF (congestive heart failure) ([place]) 05/03/2018  . Acute on chronic systolic (congestive) heart failure ([place]) 05/03/2018  . Glucose intolerance (impaired glucose tolerance) 06/23/2016  . Vitreous floaters 12/08/2015  . TIA (transient ischemic attack) 12/08/2015  . SOB (shortness of breath) 12/08/2015  . Posterior vitreous detachment of both eyes 12/08/2015  . Pain due to dental caries 12/08/2015  . Other abnormal glucose 12/08/2015  . Osteoarthrosis 12/08/2015  . Long term (current) use of anticoagulants 12/08/2015  . Kidney mass 12/08/2015  . Hypersomnolence 12/08/2015  . Hyperopia with presbyopia of both eyes 12/08/2015  . Hyperlipidemia 12/08/2015  . History of orthopnea 12/08/2015  . COPD  (chronic obstructive pulmonary disease) ([place]) 12/08/2015  . 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 AKI (acute kidney injury) ([place]) 12/08/2015  . Abnormal CT scan, kidney 12/08/2015  . Abdominal bloating 12/08/2015  . Biventricular ICD (implantable cardioverter-defibrillator) in place 12/07/2015  . Chest pain at rest 11/29/2015  . Essential hypertension 11/29/2015  . Acute on chronic systolic CHF (congestive heart failure), NYHA class 1 ([place]) 11/29/2015  . Gastroesophageal reflux disease without esophagitis 11/29/2015  . Pain in the chest   . Renal cancer ([place]) 08/16/2015  . 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 Cancer of kidney ([place]) 01/19/2015  . Hematuria 09/04/2014  . Psychosexual dysfunction with inhibited sexual excitement 07/29/2014  . Pelvic pain in male 07/29/2014  . Increased urinary frequency 07/29/2014  . Heartburn 07/29/2014  . EKG, abnormal 07/29/2014  . Corneal scar 07/29/2014  . 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 Cardiomyopathy ([place]) 07/29/2014  . CAD (coronary artery disease) 07/29/2014  . Disorder of kidney and ureter 04/09/2014    Past Surgical History:  Procedure Laterality Date  . CARDIAC CATHETERIZATION  1980's  . IMPLANTABLE CARDIOVERTER DEFIBRILLATOR IMPLANT  07/21/2010  . IR GENERIC HISTORICAL  12/23/2014   IR RADIOLOGIST EVAL & MGMT 12/23/2014 [name], MD [facility] INTERV RAD  . IR GENERIC HISTORICAL  07/20/2016   IR RADIOLOGIST EVAL & MGMT 07/20/2016 [facility] INTERV RAD  . IR RADIOLOGIST EVAL & MGMT  07/04/2017  . RENAL CRYOABLATION Left 05/2014        Home Medications    Prior to  Admission medications   Medication Sig Start Date End Date Taking? Authorizing Provider  amiodarone (PACERONE) 200 MG tablet Take 200 mg by mouth daily.    [provider]  aspirin 81 MG chewable tablet Chew 81 mg by mouth.    [provider]  benzonatate (TESSALON) 100 MG capsule Take 1 capsule (100 mg total) by mouth every 8  (eight) hours. 08/15/17   Palumbo, April, MD  budesonide-formoterol [facility]) 160-4.5 MCG/ACT inhaler Inhale into the lungs. 09/27/17   [provider]  carvedilol (COREG) 3.125 MG tablet Take 1 tablet (3.125 mg total) by mouth 2 (two) times daily with a meal. 05/05/18   Bhagat, Bhavinkumar, PA  esomeprazole (NEXIUM) 20 MG capsule Take 20 mg by mouth daily as needed (FOR HEARTBURN).    [provider]  Fluticasone-Salmeterol (ADVAIR HFA IN) Inhale into the lungs.    [provider]  furosemide (LASIX) 20 MG tablet Take 30 mg by mouth 2 (two) times daily.    [provider]  JARDIANCE 10 MG TABS tablet Take 10 mg by mouth daily. 03/06/18   [provider]  omeprazole (PRILOSEC) 20 MG capsule Take 20 mg by mouth daily.    [provider]  sucralfate (CARAFATE) 1 g tablet Take 1 tablet (1 g total) by mouth 4 (four) times daily -  with meals and at bedtime. 06/11/18   Julianne Rice, MD  tamsulosin (FLOMAX) 0.4 MG CAPS capsule Take 0.4 mg by mouth.    [provider]  torsemide (DEMADEX) 20 MG tablet Take 1.5 tablet (30mg ) in AM and 1 table (20mg ) in PM daily 05/05/18   Bhagat, [place], PA  warfarin (COUMADIN) 5 MG tablet Take 2.5 mg by mouth daily. Take 2.5 mg daily except 5 mg Thursday    [provider]    Family History Family History  Problem Relation Age of Onset  . Heart disease Father   . Heart attack Father 47  . Hypertension Father   . Hypertension Mother     Social History Social History   Tobacco Use  . Smoking status: Former Smoker    Packs/day: 0.75    Years: 17.00    Pack years: 12.75    Types: Cigarettes    Start date: 05/08/1959    Last attempt to quit: 06/20/1976    Years since quitting: 42.5  . Smokeless tobacco: Never Used  Substance Use Topics  . Alcohol use: No  . Drug use: No     Allergies   Patient has no known allergies.   Review of Systems Review of Systems  Constitutional:  Negative for chills, diaphoresis, fatigue and fever.  HENT: Negative for congestion.   Eyes: Negative for visual disturbance.  Respiratory: Positive for cough, chest tightness and shortness of breath. Negative for sputum production, wheezing and stridor.   Cardiovascular: Positive for leg swelling. Negative for chest pain and palpitations.  Gastrointestinal: Negative for abdominal pain, constipation, diarrhea, nausea and vomiting.  Genitourinary: Negative for dysuria, flank pain and frequency.  Musculoskeletal: Negative for back pain, neck pain and neck stiffness.  Skin: Negative for rash.  Neurological: Negative for light-headedness and headaches.  Psychiatric/Behavioral: Negative for agitation.  All other systems reviewed and are negative.    Physical Exam Updated Vital Signs BP 101/74 (BP Location: Right Arm)   Pulse 69   Temp (!) 96 F (35.6 C) (Tympanic) Comment: would not read orally   Resp 20   Wt 93.4 kg   SpO2 100%  BMI 27.17 kg/m   Physical Exam Vitals signs and nursing note reviewed.  Constitutional:      General: He is not in acute distress.    Appearance: He is well-developed. He is not ill-appearing, toxic-appearing or diaphoretic.  HENT:     Head: Normocephalic and atraumatic.     Mouth/Throat:     Pharynx: No pharyngeal swelling or oropharyngeal exudate.  Eyes:     Conjunctiva/sclera: Conjunctivae normal.     Pupils: Pupils are equal, round, and reactive to light.  Neck:     Musculoskeletal: Neck supple.  Cardiovascular:     Rate and Rhythm: Normal rate and regular rhythm.     Heart sounds: No murmur.  Pulmonary:     Effort: Pulmonary effort is normal. Tachypnea present. No respiratory distress.     Breath sounds: Rhonchi and rales present.  Chest:     Chest wall: No mass or tenderness.  Abdominal:     Palpations: Abdomen is soft.     Tenderness: There is no abdominal tenderness.  Musculoskeletal:     Right lower leg: He exhibits no tenderness.  Edema present.     Left lower leg: He exhibits no tenderness. Edema present.  Skin:    General: Skin is warm and dry.     Capillary Refill: Capillary refill takes less than 2 seconds.     Findings: No rash.  Neurological:     General: No focal deficit present.     Mental Status: He is alert and oriented to person, place, and time.  Psychiatric:        Mood and Affect: Mood normal.      ED Treatments / Results  Labs (all labs ordered are listed, but only abnormal results are displayed) Labs Reviewed  TROPONIN I - Abnormal; Notable for the following components:      Result Value   Troponin I 0.05 (*)    All other components within normal limits  COMPREHENSIVE METABOLIC PANEL - Abnormal; Notable for the following components:   Glucose, Bld 113 (*)    BUN 34 (*)    Creatinine, Ser 1.77 (*)    Albumin 3.1 (*)    Total Bilirubin 2.8 (*)    GFR calc non Af Amer 36 (*)    GFR calc Af Amer 42 (*)    All other components within normal limits  BRAIN NATRIURETIC PEPTIDE - Abnormal; Notable for the following components:   B Natriuretic Peptide >4,500.0 (*)    All other components within normal limits  CBC WITH DIFFERENTIAL/PLATELET - Abnormal; Notable for the following components:   MCH 25.9 (*)    RDW 21.3 (*)    Platelets 144 (*)    All other components within normal limits  URINALYSIS, ROUTINE W REFLEX MICROSCOPIC - Abnormal; Notable for the following components:   Hgb urine dipstick MODERATE (*)    Leukocytes, UA MODERATE (*)    All other components within normal limits  PROTIME-INR - Abnormal; Notable for the following components:   Prothrombin Time 33.3 (*)    All other components within normal limits  URINALYSIS, MICROSCOPIC (REFLEX) - Abnormal; Notable for the following components:   Bacteria, UA MANY (*)    All other components within normal limits  URINE CULTURE    EKG None ED ECG REPORT   Date: 12/30/2018  Rate: 70  Rhythm: Paced   Narrative Interpretation:     Old EKG Reviewed: unchanged  I have personally reviewed the EKG tracing and agree with  the computerized printout as noted.    Radiology Dg Chest 2 View  Result Date: 12/29/2018 CLINICAL DATA:  Worsening shortness of breath EXAM: CHEST - 2 VIEW COMPARISON:  12/28/2018. FINDINGS: Chronic enlargement of the cardiac silhouette. Pacemaker/AICD remains in place. Persistent right effusion with right base atelectasis. Pulmonary venous hypertension. IMPRESSION: Congestive heart failure with enlarged cardiac silhouette, venous hypertension, right effusion and right base atelectasis. Similar appearance to the study of yesterday. Electronically Signed   By: Nelson Chimes M.D.   On: 12/29/2018 20:55   Dg Chest 2 View  Result Date: 12/28/2018 CLINICAL DATA:  Worsening shortness of breath since last night. Dyspnea. CHF. EXAM: CHEST - 2 VIEW COMPARISON:  Radiograph 12/04/2018, additional priors FINDINGS: Left-sided pacemaker with leads unchanged in position. Stable prominent cardiomegaly. Right pleural effusion appears chronic with associated atelectasis, present dating back to abdominal CT 06/17/2018. Possible trace left pleural effusion. No pulmonary edema. No acute airspace disease. No pneumothorax. Unchanged osseous structures. IMPRESSION: 1. Chronic right pleural effusion with associated atelectasis. Possible trace left pleural effusion. 2. Stable marked cardiomegaly. No pulmonary edema or new focal airspace disease. Electronically Signed   By: Keith Rake M.D.   On: 12/28/2018 19:13    Procedures Procedures (including critical care time)  Medications Ordered in ED Medications  sodium chloride flush (NS) 0.9 % injection 3 mL (has no administration in time range)  furosemide (LASIX) injection 40 mg (40 mg Intravenous Given 12/29/18 2239)     Initial Impression / Assessment and Plan / ED Course  I have reviewed the triage vital signs and the nursing notes.  Pertinent labs & imaging results that  were available during my care of the patient were reviewed by me and considered in my medical decision making (see chart for details).     Spencer Rice is a 78 y.o. male with a past medical history significant for COPD on 2 L home oxygen at all times, GERD, CHF, prior DVT on Coumadin therapy, and hypertension who presents back to the emergency department for continued shortness of breath.  Patient reports that he was seen at this facility yesterday for similar symptoms.  During that visit, patient was found to have CHF exacerbation and was feeling better after IV diuresis.  Patient reports that he went home and had worsening of symptoms throughout the day.  He feels very short of breath despite staying on his home oxygen.  He has been using all of his diuresis as recommended.  He reports his legs are continuing to swell over the last 2 weeks bilaterally.  He reports that he feels this is a CHF flaring.  He denies significant wheezing.  Denies congestion and reports a chronic cough.  Denies fever chills.  Denies any chest pain or palpitations.  Denies any nausea, vomiting, urinary symptoms or GI symptoms.  No recent trauma.  On exam, patient has crackles in the base of both lungs however his right lower lobe is diminished and has rhonchi compared to left.  Chest is nontender.  Back is nontender.  Abdomen is nontender.  Legs are wrapped but edematous to the touch.  Palpable DP pulses are felt.  Patient on his 2 L.  Clinically suspect patient has continued CHF exacerbation in the IV diuresis from yesterday did not fully improve his symptoms.  Patient reports he is concerned that he is still having severe shortness of breath.  Patient will have repeat x-ray and labs.  If symptoms persist, anticipate admission for IV diuresis overnight.  Repeat labs and imaging are similar to yesterday.  Patient continues to have shortness of breath and does not feel safe going home.  Patient given IV Lasix and will  be admitted for diuresis.  Patient transferred admitted in stable condition.   Final Clinical Impressions(s) / ED Diagnoses   Final diagnoses:  SOB (shortness of breath)  Acute on chronic congestive heart failure, unspecified heart failure type [facility])    ED Discharge Orders    None      Clinical Impression: 1. SOB (shortness of breath)   2. Acute on chronic congestive heart failure, unspecified heart failure type ([place])     Disposition: Admit  This note was prepared with assistance of Dragon voice recognition software. Occasional wrong-word or sound-a-like substitutions may have occurred due to the inherent limitations of voice recognition software.     Grettel Rames, Gwenyth Allegra, MD 12/30/18 [phone]

## 2018-12-29 NOTE — ED Notes (Signed)
Pt stated he did not want an x-ray because he just had one last night.

## 2018-12-29 NOTE — ED Notes (Signed)
Patient refused all further testing including EKG and Chest x-ray

## 2018-12-29 NOTE — ED Notes (Signed)
Pt given with verbal approval from Dr. Sherry Ruffing

## 2018-12-29 NOTE — ED Notes (Signed)
EMT went in to triage room and made patient aware that this EMT was going to do an EKG. Patient refused the EKG saying" just wait till I lay down, I don't need one anyone they did one yesterday when I was here"

## 2018-12-29 NOTE — ED Notes (Signed)
Patient transported to X-ray 

## 2018-12-29 NOTE — ED Triage Notes (Signed)
Patient was here just last night for the same. The patient states that it he was felling better last night when he left and now it is back to not being able to get his breath

## 2018-12-30 ENCOUNTER — Inpatient Hospital Stay (HOSPITAL_COMMUNITY): Payer: Medicare Other

## 2018-12-30 ENCOUNTER — Inpatient Hospital Stay: Payer: Self-pay

## 2018-12-30 ENCOUNTER — Encounter (HOSPITAL_COMMUNITY): Payer: Self-pay | Admitting: General Practice

## 2018-12-30 DIAGNOSIS — N183 Chronic kidney disease, stage 3 unspecified: Secondary | ICD-10-CM | POA: Diagnosis present

## 2018-12-30 DIAGNOSIS — I493 Ventricular premature depolarization: Secondary | ICD-10-CM

## 2018-12-30 DIAGNOSIS — I5023 Acute on chronic systolic (congestive) heart failure: Secondary | ICD-10-CM

## 2018-12-30 LAB — PROTIME-INR
INR: 3.69
Prothrombin Time: 36 seconds — ABNORMAL HIGH (ref 11.4–15.2)

## 2018-12-30 LAB — COOXEMETRY PANEL
CARBOXYHEMOGLOBIN: 1.2 % (ref 0.5–1.5)
Methemoglobin: 1.4 % (ref 0.0–1.5)
O2 Saturation: 55.8 %
Total hemoglobin: 13.1 g/dL (ref 12.0–16.0)

## 2018-12-30 MED ORDER — FUROSEMIDE 10 MG/ML IJ SOLN
80.0000 mg | Freq: Two times a day (BID) | INTRAMUSCULAR | Status: DC
  Administered 2018-12-30 – 2018-12-31 (×3): 80 mg via INTRAVENOUS
  Filled 2018-12-30 (×3): qty 8

## 2018-12-30 MED ORDER — SODIUM CHLORIDE 0.9% FLUSH: 3.0000 mL | INTRAVENOUS | Status: DC | PRN

## 2018-12-30 MED ORDER — ORAL CARE MOUTH RINSE
15.0000 mL | Freq: Two times a day (BID) | OROMUCOSAL | Status: DC
  Administered 2018-12-30 – 2019-01-29 (×33): 15 mL via OROMUCOSAL

## 2018-12-30 MED ORDER — SODIUM CHLORIDE 0.9 % IV SOLN
250.0000 mL | INTRAVENOUS | Status: DC | PRN
  Administered 2019-01-11: 250 mL via INTRAVENOUS

## 2018-12-30 MED ORDER — ONDANSETRON HCL 4 MG/2ML IJ SOLN: 4.0000 mg | Freq: Four times a day (QID) | INTRAMUSCULAR | Status: DC | PRN

## 2018-12-30 MED ORDER — INSULIN ASPART 100 UNIT/ML ~~LOC~~ SOLN: 0.0000 [IU] | Freq: Three times a day (TID) | SUBCUTANEOUS | Status: DC

## 2018-12-30 MED ORDER — AMIODARONE HCL 200 MG PO TABS
200.0000 mg | ORAL_TABLET | Freq: Every day | ORAL | Status: DC
  Administered 2018-12-30: 200 mg via ORAL
  Filled 2018-12-30: qty 1

## 2018-12-30 MED ORDER — FUROSEMIDE 10 MG/ML IJ SOLN
40.0000 mg | Freq: Once | INTRAMUSCULAR | Status: AC
  Administered 2018-12-30: 40 mg via INTRAVENOUS
  Filled 2018-12-30: qty 4

## 2018-12-30 MED ORDER — SODIUM CHLORIDE 0.9% FLUSH
3.0000 mL | Freq: Two times a day (BID) | INTRAVENOUS | Status: DC
  Administered 2018-12-30 – 2019-01-29 (×21): 3 mL via INTRAVENOUS

## 2018-12-30 MED ORDER — ACETAMINOPHEN 325 MG PO TABS
650.0000 mg | ORAL_TABLET | ORAL | Status: DC | PRN
  Administered 2019-01-01 – 2019-01-02 (×2): 650 mg via ORAL
  Filled 2018-12-30 (×2): qty 2

## 2018-12-30 MED ORDER — ALPRAZOLAM 0.25 MG PO TABS
0.2500 mg | ORAL_TABLET | Freq: Once | ORAL | Status: DC
  Filled 2018-12-30: qty 1

## 2018-12-30 MED ORDER — SODIUM CHLORIDE 0.9% FLUSH
10.0000 mL | INTRAVENOUS | Status: DC | PRN
  Administered 2019-01-12: 10 mL
  Filled 2018-12-30: qty 40

## 2018-12-30 MED ORDER — AMIODARONE HCL 200 MG PO TABS
200.0000 mg | ORAL_TABLET | Freq: Every day | ORAL | Status: DC
  Administered 2018-12-30 – 2019-01-29 (×31): 200 mg via ORAL
  Filled 2018-12-30 (×31): qty 1

## 2018-12-30 MED ORDER — CARVEDILOL 6.25 MG PO TABS
6.2500 mg | ORAL_TABLET | Freq: Two times a day (BID) | ORAL | Status: DC
  Administered 2018-12-30: 6.25 mg via ORAL
  Filled 2018-12-30: qty 1

## 2018-12-30 MED ORDER — FUROSEMIDE 10 MG/ML IJ SOLN: 40.0000 mg | Freq: Two times a day (BID) | INTRAMUSCULAR | Status: DC

## 2018-12-30 NOTE — Progress Notes (Signed)
ANTICOAGULATION CONSULT NOTE - Initial Consult  Pharmacy Consult for Coumadin Indication: apical mural thrombus  No Known Allergies  Patient Measurements: Height: 6' 1.5" (186.7 cm) Weight: 203 lb 12.8 oz (92.4 kg) IBW/kg (Calculated) : 81.05  Vital Signs: Temp: 97.5 F (36.4 C) (01/27 0608) Temp Source: Oral (01/27 0608) BP: 100/72 (01/27 0608) Pulse Rate: 71 (01/27 0608)  Labs: Recent Labs    12/28/18 1803 12/28/18 1821 12/28/18 1829 12/29/18 1942 12/30/18 0919  HGB 13.9  --   --  13.6  --   HCT 43.8  --   --  43.1  --   PLT 139*  --   --  144*  --   LABPROT  --   --  28.7* 33.3* 36.0*  INR  --   --  2.75 3.33 3.69  CREATININE 1.99*  --   --  1.77*  --   TROPONINI  --  0.05*  --  0.05*  --     Estimated Creatinine Clearance: 40.1 mL/min (A) (by C-G formula based on SCr of 1.77 mg/dL (H)).   Medical History: Past Medical History:  Diagnosis Date  . AICD (automatic cardioverter/defibrillator) present   . Cancer of left kidney ([place])    S/P OR 05/2014  . CHF (congestive heart failure) ([place])   . Coronary artery disease   . DVT (deep venous thrombosis) ([facility]) 1983   LLE  . GERD (gastroesophageal reflux disease)   . History of hiatal hernia   . Hypertension     Medications:  Medications Prior to Admission  Medication Sig Dispense Refill Last Dose  . amiodarone (PACERONE) 200 MG tablet Take 200 mg by mouth daily.   12/29/2018 at am  . aspirin 81 MG chewable tablet Chew 81 mg by mouth.   12/29/2018 at am  . carvedilol (COREG) 12.5 MG tablet Take 6.25 mg by mouth 2 (two) times daily. Taking 1/2 tablet (6.25mg ) twice daily   12/29/2018 at am  . esomeprazole (NEXIUM) 20 MG capsule Take 20 mg by mouth daily as needed (FOR HEARTBURN).   unk at prn  . JARDIANCE 10 MG TABS tablet Take 10 mg by mouth daily.  1 12/29/2018 at Unknown time  . mometasone-formoterol (DULERA) 200-5 MCG/ACT AERO Inhale 2 puffs into the lungs 2 (two) times daily.   12/29/2018 at Unknown time  .  Multiple Vitamin (MULTIVITAMIN WITH MINERALS) TABS tablet Take 1 tablet by mouth daily.   12/29/2018 at Unknown time  . omeprazole (PRILOSEC) 20 MG capsule Take 20 mg by mouth daily.   12/29/2018 at Unknown time  . potassium chloride (K-DUR,KLOR-CON) 10 MEQ tablet Take 10 mEq by mouth every other day.   12/29/2018 at Unknown time  . sucralfate (CARAFATE) 1 g tablet Take 1 tablet (1 g total) by mouth 4 (four) times daily -  with meals and at bedtime. 28 tablet 0 12/29/2018 at Unknown time  . tamsulosin (FLOMAX) 0.4 MG CAPS capsule Take 0.4 mg by mouth.   12/29/2018 at Unknown time  . torsemide (DEMADEX) 20 MG tablet Take 40 mg by mouth 2 (two) times daily.   12/29/2018 at Unknown time  . warfarin (COUMADIN) 5 MG tablet Take 2.5 mg by mouth daily. Take 2.5 mg daily except 5 mg Thursday   12/28/2018 at 1800  . benzonatate (TESSALON) 100 MG capsule Take 1 capsule (100 mg total) by mouth every 8 (eight) hours. (Patient not taking: Reported on 12/30/2018) 21 capsule 0 Not Taking at Unknown time  . carvedilol (COREG)  3.125 MG tablet Take 1 tablet (3.125 mg total) by mouth 2 (two) times daily with a meal. (Patient not taking: Reported on 12/30/2018) 60 tablet 1 Not Taking at Unknown time  . torsemide (DEMADEX) 20 MG tablet Take 1.5 tablet (30mg ) in AM and 1 table (20mg ) in PM daily (Patient not taking: Reported on 12/30/2018) 75 tablet 1 Not Taking at Unknown time   Scheduled:  . ALPRAZolam  0.25 mg Oral Once  . amiodarone  200 mg Oral Daily  . furosemide  80 mg Intravenous BID  . mouth rinse  15 mL Mouth Rinse BID  . sodium chloride flush  3 mL Intravenous Once  . sodium chloride flush  3 mL Intravenous Q12H    Assessment: 70 yoM admitted with volume overload. Pt on warfarin PTA for hx of mural thromus. INR supratherapeutic on admit at 3.69, last dose PTA on 1/25.  *Home Dose = 5mg  Thurs, 2.5mg  all other days  Goal of Therapy:  INR 2-3   Plan:  -Hold warfarin tonight -Daily INR  Arrie Senate,  PharmD, BCPS Clinical Pharmacist [phone] Please check AMION for all [facility] Pharmacy numbers 12/30/2018

## 2018-12-30 NOTE — Progress Notes (Signed)
ANTICOAGULATION CONSULT NOTE - Initial Consult  Pharmacy Consult for Coumadin Indication: apical mural thrombus  No Known Allergies  Patient Measurements: Height: 6' 1.5" (186.7 cm) Weight: 203 lb 12.8 oz (92.4 kg) IBW/kg (Calculated) : 81.05  Vital Signs: Temp: 97.6 F (36.4 C) (01/27 0107) Temp Source: Oral (01/27 0107) BP: 104/74 (01/27 0107) Pulse Rate: 69 (01/27 0107)  Labs: Recent Labs    12/28/18 1803 12/28/18 1821 12/28/18 1829 12/29/18 1942  HGB 13.9  --   --  13.6  HCT 43.8  --   --  43.1  PLT 139*  --   --  144*  LABPROT  --   --  28.7* 33.3*  INR  --   --  2.75 3.33  CREATININE 1.99*  --   --  1.77*  TROPONINI  --  0.05*  --  0.05*    Estimated Creatinine Clearance: 40.1 mL/min (A) (by C-G formula based on SCr of 1.77 mg/dL (H)).   Medical History: Past Medical History:  Diagnosis Date  . AICD (automatic cardioverter/defibrillator) present   . Cancer of left kidney ([place])    S/P OR 05/2014  . CHF (congestive heart failure) ([place])   . Coronary artery disease   . DVT (deep venous thrombosis) ([facility]) 1983   LLE  . GERD (gastroesophageal reflux disease)   . History of hiatal hernia   . Hypertension     Medications:  Medications Prior to Admission  Medication Sig Dispense Refill Last Dose  . amiodarone (PACERONE) 200 MG tablet Take 200 mg by mouth daily.   05/03/2018 at Unknown time  . aspirin 81 MG chewable tablet Chew 81 mg by mouth.   05/03/2018 at Unknown time  . benzonatate (TESSALON) 100 MG capsule Take 1 capsule (100 mg total) by mouth every 8 (eight) hours. 21 capsule 0   . budesonide-formoterol (SYMBICORT) 160-4.5 MCG/ACT inhaler Inhale into the lungs.   Not Taking at Unknown time  . carvedilol (COREG) 3.125 MG tablet Take 1 tablet (3.125 mg total) by mouth 2 (two) times daily with a meal. 60 tablet 1   . esomeprazole (NEXIUM) 20 MG capsule Take 20 mg by mouth daily as needed (FOR HEARTBURN).   Past Month at Unknown time  . Fluticasone-Salmeterol  (ADVAIR HFA IN) Inhale into the lungs.   Not Taking at Unknown time  . furosemide (LASIX) 20 MG tablet Take 30 mg by mouth 2 (two) times daily.     Marland Kitchen JARDIANCE 10 MG TABS tablet Take 10 mg by mouth daily.  1   . omeprazole (PRILOSEC) 20 MG capsule Take 20 mg by mouth daily.   05/03/2018 at Unknown time  . sucralfate (CARAFATE) 1 g tablet Take 1 tablet (1 g total) by mouth 4 (four) times daily -  with meals and at bedtime. 28 tablet 0   . tamsulosin (FLOMAX) 0.4 MG CAPS capsule Take 0.4 mg by mouth.   05/03/2018 at Unknown time  . torsemide (DEMADEX) 20 MG tablet Take 1.5 tablet (30mg ) in AM and 1 table (20mg ) in PM daily 75 tablet 1   . warfarin (COUMADIN) 5 MG tablet Take 2.5 mg by mouth daily. Take 2.5 mg daily except 5 mg Thursday   05/03/2018 at Unknown time   Scheduled:  . sodium chloride flush  3 mL Intravenous Once    Assessment: 78yo male c/o SOB, admitted for diuresis, to continue Coumadin for apical mural thombosis; current INR above goal.  Goal of Therapy:  INR 2-3   Plan:  Will hold Coumadin for now and monitor daily INR for dose adjustments.  Wynona Neat, PharmD, BCPS  12/30/2018,2:38 AM

## 2018-12-30 NOTE — Progress Notes (Signed)
PROGRESS NOTE  Spencer Rice LEX:[id number] DOB: 1941-03-22 DOA: 12/29/2018 PCP: [name]., MD   LOS: 1 day   No charge note, admitted overnight  Brief Narrative / Interim history: 78 year old male with history of systolic CHF with EF less than 20%, history of biventricular fibrillator, hypertension, chronic kidney disease stage III, COPD on chronic oxygen at home 2 L, prior history of kidney cancer status post cryoablation in 2015 who had several ED visits over the last month with progressive shortness of breath, he was also seen as an outpatient in his cardiology office, his diuretics were adjusted but despite that he has had progressive worsening shortness of breath and eventually was admitted to the hospital on 1/27.  Reports feeling extremely short of breath every time he even tries to lay back and he has been sitting upright and sleeping in a chair over the last few days.  He can barely ambulate due to dyspnea.  In the ED he was found to be marked fluid overloaded on chest x-ray, exam, and BNP was about 4500.  He was placed on IV Lasix and was admitted to the hospital.  Subjective: -Breathing is a little bit better this morning but not by much.  He was finally able to get some rest.  He denies any chest pain or palpitations.  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  PVCs.  -Increase Lasix to 80 twice daily, stop Coreg, plan is to place a PICC line to evaluate Choloxin CVP with consideration for inotropes  Active Problems Hypertension -Continue diuresis, Coreg is now on hold.  Chronic kidney disease stage III -Baseline creatinine around 1.8-2.2, even as far high as 2.4 -Creatinine this morning 1.77, better than baseline, continue diuresis and closely monitor.  History of apical mural thrombus -Continue Coumadin  COPD on chronic home O2 -No wheezing, this appears to be stable, continue 2 L nasal cannula which is his baseline  Left kidney cancer -Status post cryoablation in 2015  Bilateral lower extremity wounds -Currently Ace wrap, consulted wound care.  He has been followed regularly at the wound care center in [facility] (per patient).  Scheduled Meds: . ALPRAZolam  0.25 mg Oral Once  . amiodarone  200 mg Oral Daily  . furosemide  80 mg Intravenous BID  . mouth rinse  15 mL Mouth Rinse BID  . sodium chloride flush  3 mL Intravenous Once  . sodium chloride flush  3 mL Intravenous Q12H   Continuous Infusions: . sodium chloride     PRN Meds:.sodium chloride, acetaminophen, ondansetron (ZOFRAN) IV, sodium chloride flush  DVT prophylaxis: Coumadin Code Status: Full code Family Communication: no family at bedside  Disposition Plan: TBD  Consultants:   Cardiology - heart failure  Procedures:   2D echo: pending  Antimicrobials:  None    Objective: Vitals:   12/30/18 0000 12/30/18 0107 12/30/18 0112 12/30/18 0608  BP: 102/75  104/74  100/72  Pulse: 69 69  71  Resp: (!) 24     Temp:  97.6 F (36.4 C)  (!) 97.5 F (36.4 C)  TempSrc:  Oral  Oral  SpO2: 100% 100%  100%  Weight:   92.4 kg   Height:   6' 1.5" (1.867 m)     Intake/Output Summary (Last 24 hours) at 12/30/2018 0959 Last data filed at 12/30/2018 0926 Gross per 24 hour  Intake 702 ml  Output 725 ml  Net -23  ml   Filed Weights   12/29/18 1657 12/30/18 0112  Weight: 93.4 kg 92.4 kg    Examination:  Constitutional: NAD, sitting upright Eyes: No scleral icterus ENMT: Mucous membranes are moist.  Neck: normal, supple Respiratory: Decreased basilar sounds bilaterally, crackles heard.  No wheezing.  Increased respiratory effort Cardiovascular: Regular rate and rhythm, 3/6 SEM. + JVD Abdomen: no tenderness.  Musculoskeletal: no clubbing / cyanosis. Skin: no rashes, bilateral lower extremity Ace wrapped Neurologic: CN 2-12 grossly intact. Strength 5/5 in all 4.  Psychiatric: Normal judgment and insight. Alert and oriented x 3. Normal mood.    Data Reviewed: I have independently reviewed following labs and imaging studies   CBC: Recent Labs  Lab 12/28/18 1803 12/29/18 1942  WBC 5.7 5.7  NEUTROABS  --  4.0  HGB 13.9 13.6  HCT 43.8 43.1  MCV 81.3 81.9  PLT 139* 998*   Basic Metabolic Panel: Recent Labs  Lab 12/28/18 1803 12/29/18 1942  NA 137 137  K 3.7 3.9  CL 98 98  CO2 29 27  GLUCOSE 131* 113*  BUN 33* 34*  CREATININE 1.99* 1.77*  CALCIUM 8.8* 8.9   GFR: Estimated Creatinine Clearance: 40.1 mL/min (A) (by C-G formula based on SCr of 1.77 mg/dL (H)). Liver Function Tests: Recent Labs  Lab 12/29/18 1942  AST 39  ALT 21  ALKPHOS 111  BILITOT 2.8*  PROT 7.1  ALBUMIN 3.1*   No results for input(s): LIPASE, AMYLASE in the last 168 hours. No results for input(s): AMMONIA in the last 168 hours. Coagulation Profile: Recent Labs  Lab 12/28/18 1829 12/29/18 1942 12/30/18 0919  INR 2.75 3.33 3.69   Cardiac Enzymes: Recent Labs  Lab 12/28/18 1821 12/29/18 1942  TROPONINI 0.05* 0.05*   BNP (last 3 results) No results for input(s): PROBNP in the last 8760 hours. HbA1C: No results for input(s): HGBA1C in the last 72 hours. CBG: No results for input(s): GLUCAP in the last 168 hours. Lipid Profile: No results for input(s): CHOL, HDL, LDLCALC, TRIG, CHOLHDL,  LDLDIRECT in the last 72 hours. Thyroid Function Tests: No results for input(s): TSH, T4TOTAL, FREET4, T3FREE, THYROIDAB in the last 72 hours. Anemia Panel: No results for input(s): VITAMINB12, FOLATE, FERRITIN, TIBC, IRON, RETICCTPCT in the last 72 hours. Urine analysis:    Component Value Date/Time   COLORURINE YELLOW 12/29/2018 1943   APPEARANCEUR CLEAR 12/29/2018 1943   LABSPEC 1.015 12/29/2018 1943   PHURINE 5.0 12/29/2018 [place] 12/29/2018 [place] (A) 12/29/2018 [place] 12/29/2018 [place] 12/29/2018 [place] NEGATIVE 12/29/2018 1943   NITRITE NEGATIVE 12/29/2018 1943   LEUKOCYTESUR MODERATE (A) 12/29/2018 1943   Sepsis Labs: Invalid input(s): PROCALCITONIN, LACTICIDVEN  No results found for this or any previous visit (from the past 240 hour(s)).    Radiology Studies: Dg Chest 2 View  Result Date: 12/29/2018 CLINICAL DATA:  Worsening shortness of breath EXAM:  CHEST - 2 VIEW COMPARISON:  12/28/2018. FINDINGS: Chronic enlargement of the cardiac silhouette. Pacemaker/AICD remains in place. Persistent right effusion with right base atelectasis. Pulmonary venous hypertension. IMPRESSION: Congestive heart failure with enlarged cardiac silhouette, venous hypertension, right effusion and right base atelectasis. Similar appearance to the study of yesterday. Electronically Signed   By: Nelson Chimes M.D.   On: 12/29/2018 20:55   Dg Chest 2 View  Result Date: 12/28/2018 CLINICAL DATA:  Worsening shortness of breath since last night. Dyspnea. CHF. EXAM: CHEST - 2 VIEW COMPARISON:  Radiograph 12/04/2018, additional priors FINDINGS: Left-sided pacemaker with leads unchanged in position. Stable prominent cardiomegaly. Right pleural effusion appears chronic with associated atelectasis, present dating back to abdominal CT 06/17/2018. Possible trace left pleural effusion. No pulmonary edema. No acute airspace disease. No  pneumothorax. Unchanged osseous structures. IMPRESSION: 1. Chronic right pleural effusion with associated atelectasis. Possible trace left pleural effusion. 2. Stable marked cardiomegaly. No pulmonary edema or new focal airspace disease. Electronically Signed   By: Keith Rake M.D.   On: 12/28/2018 19:13   [place] Ekg Site Rite  Result Date: 12/30/2018 If Site Rite image not attached, placement could not be confirmed due to current cardiac rhythm.   Marzetta Board, MD, PhD [facility]  Contact via  [email or web address]  [place] P: [phone]  F: [phone]

## 2018-12-30 NOTE — Consult Note (Signed)
[place] Nurse wound consult note Reason for Consult: CHF exacerbation.  Wears Unna boots changed weekly on Wednesday.  Will order for ortho tech to change on Wednesday.  No open areas.  Compression only.  Wound type:Apply Unna boots to edematous legs Pressure Injury POA: NA Measurement: N/A Wound bed:n/A Drainage (amount, consistency, odor) moderate serous weeping from edema Periwound:edema Dressing procedure/placement/frequency:Apply Unna boots every Wednesday.  Will not follow at this time.  Please re-consult if needed.  Domenic Moras MSN, RN, FNP-BC CWON Wound, Ostomy, Continence Nurse Pager [phone]

## 2018-12-30 NOTE — Progress Notes (Signed)
RN called into room by pt. Pt c/o of sob & orthopnea. Upon arrival pt was tachypneic & using accessory muscles to breathe. Pt was not this sob upon RN's original assessment of pt 1.5 hours prior. MD at bedside with pt at this time. New order for 40 mg of IV lasix. Will give & continue to monitor the pt.Hoover Brunette, RN

## 2018-12-30 NOTE — Progress Notes (Signed)
RN called back into room by pt. Pt still c/o of orthopnea stating that he was awakened out of his sleep d/t it. Pt was given 40 mg of IV lasix at 0255. Pt has put out 150 cc of urine since then via a condom cath. MD on call notified of this. Pt is currently sitting on the side of the bed. O2 sat 100 % on 2 L via [place] & lung sounds diminished in bases bilaterally. Will continue to monitor the pt & await any new orders. Hoover Brunette, RN

## 2018-12-30 NOTE — H&P (Signed)
History and Physical    Spencer Rice XBL:[id number] DOB: [birth year] DOA: 12/29/2018  PCP: [name]., MD  Patient coming from: Home  I have personally briefly reviewed patient's old medical records in [place]  Chief Complaint: SOB  HPI: Spencer Rice is a 78 y.o. male with medical history significant of HFrEF with EF less than 20%, s/p biventricular defibrillator (Biotronik).  Also has HTN, CKD stage 3, COPD 2L home O2 at baseline, cryoablation of L kidney cancer in 2015.  Patient presents to the [facility] ED today with c/o continued SOB.  Patient was seen yesterday for similar symptoms, diagnosed with CHF exacerbation, feeling better after IV diuresis initially but then he went home and symptoms got worse again.  He feels very short of breath despite staying on his home oxygen.  He has been using all of his diuretics as recommended.  He reports his legs are continuing to swell over the last 2 weeks bilaterally.  He feels that CHF is worsening.   ED Course: CXR confirms CHF findings including pulm edema, R pleural effusion.  BNP > 4500.  Given 40mg  IV lasix and transferred to [facility] for admission.   Review of Systems: As per HPI otherwise 10 point review of systems negative.   Past Medical History:  Diagnosis Date  . AICD (automatic cardioverter/defibrillator) present   . Cancer of left kidney ([place])    S/P OR 05/2014  . CHF (congestive heart failure) ([place])   . Coronary artery disease   . DVT (deep venous thrombosis) (HCC) 1983   LLE  . GERD (gastroesophageal reflux disease)   . History of hiatal hernia   . Hypertension     Past Surgical History:  Procedure Laterality Date  . CARDIAC CATHETERIZATION  1980's  . IMPLANTABLE CARDIOVERTER DEFIBRILLATOR IMPLANT  07/21/2010  . IR GENERIC HISTORICAL  12/23/2014   IR RADIOLOGIST EVAL & MGMT 12/23/2014 [name], MD [facility] INTERV RAD  . IR GENERIC HISTORICAL  07/20/2016   IR RADIOLOGIST EVAL & MGMT 07/20/2016 [facility]  INTERV RAD  . IR RADIOLOGIST EVAL & MGMT  07/04/2017  . RENAL CRYOABLATION Left 05/2014     reports that he quit smoking about 42 years ago. His smoking use included cigarettes. He started smoking about 59 years ago. He has a 12.75 pack-year smoking history. He has never used smokeless tobacco. He reports that he does not drink alcohol or use drugs.  No Known Allergies  Family History  Problem Relation Age of Onset  . Heart disease Father   . Heart attack Father 1  . Hypertension Father   . Hypertension Mother      Prior to Admission medications   Medication Sig Start Date End Date Taking? Authorizing Provider  amiodarone (PACERONE) 200 MG tablet Take 200 mg by mouth daily.    [provider]  aspirin 81 MG chewable tablet Chew 81 mg by mouth.    [provider]  benzonatate (TESSALON) 100 MG capsule Take 1 capsule (100 mg total) by mouth every 8 (eight) hours. 08/15/17   Palumbo, April, MD  budesonide-formoterol [facility]) 160-4.5 MCG/ACT inhaler Inhale into the lungs. 09/27/17   [provider]  carvedilol (COREG) 3.125 MG tablet Take 1 tablet (3.125 mg total) by mouth 2 (two) times daily with a meal. 05/05/18   Bhagat, Bhavinkumar, PA  esomeprazole (NEXIUM) 20 MG capsule Take 20 mg by mouth daily as needed (FOR HEARTBURN).    [provider]  Fluticasone-Salmeterol (ADVAIR HFA IN) Inhale into  the lungs.    [provider]  furosemide (LASIX) 20 MG tablet Take 30 mg by mouth 2 (two) times daily.    [provider]  JARDIANCE 10 MG TABS tablet Take 10 mg by mouth daily. 03/06/18   [provider]  omeprazole (PRILOSEC) 20 MG capsule Take 20 mg by mouth daily.    [provider]  sucralfate (CARAFATE) 1 g tablet Take 1 tablet (1 g total) by mouth 4 (four) times daily -  with meals and at bedtime. 06/11/18   Julianne Rice, MD  tamsulosin (FLOMAX) 0.4 MG CAPS capsule Take 0.4 mg by mouth.    [provider]    torsemide (DEMADEX) 20 MG tablet Take 1.5 tablet (30mg ) in AM and 1 table (20mg ) in PM daily 05/05/18   Bhagat, [place], PA  warfarin (COUMADIN) 5 MG tablet Take 2.5 mg by mouth daily. Take 2.5 mg daily except 5 mg Thursday    [provider]    Physical Exam: Vitals:   12/29/18 2330 12/30/18 0000 12/30/18 0107 12/30/18 0112  BP: 105/73 102/75 104/74   Pulse: 71 69 69   Resp: (!) 23 (!) 24    Temp:   97.6 F (36.4 C)   TempSrc:   Oral   SpO2: 100% 100% 100%   Weight:    92.4 kg  Height:    6' 1.5" (1.867 m)    Constitutional: NAD, calm, comfortable Eyes: PERRL, lids and conjunctivae normal ENMT: Mucous membranes are moist. Posterior pharynx clear of any exudate or lesions.Normal dentition.  Neck: normal, supple, no masses, no thyromegaly Respiratory: clear to auscultation bilaterally, no wheezing, no crackles. Normal respiratory effort. No accessory muscle use.  Cardiovascular: Regular rate and rhythm, no murmurs / rubs / gallops. 4+ BLE edema. 2+ pedal pulses. No carotid bruits.  Abdomen: no tenderness, no masses palpated. No hepatosplenomegaly. Bowel sounds positive.  Musculoskeletal: no clubbing / cyanosis. No joint deformity upper and lower extremities. Good ROM, no contractures. Normal muscle tone.  Skin: no rashes, lesions, ulcers. No induration Neurologic: CN 2-12 grossly intact. Sensation intact, DTR normal. Strength 5/5 in all 4.  Psychiatric: Normal judgment and insight. Alert and oriented x 3. Normal mood.    Labs on Admission: I have personally reviewed following labs and imaging studies  CBC: Recent Labs  Lab 12/28/18 1803 12/29/18 1942  WBC 5.7 5.7  NEUTROABS  --  4.0  HGB 13.9 13.6  HCT 43.8 43.1  MCV 81.3 81.9  PLT 139* 878*   Basic Metabolic Panel: Recent Labs  Lab 12/28/18 1803 12/29/18 1942  NA 137 137  K 3.7 3.9  CL 98 98  CO2 29 27  GLUCOSE 131* 113*  BUN 33* 34*  CREATININE 1.99* 1.77*  CALCIUM 8.8* 8.9   GFR: Estimated  Creatinine Clearance: 40.1 mL/min (A) (by C-G formula based on SCr of 1.77 mg/dL (H)). Liver Function Tests: Recent Labs  Lab 12/29/18 1942  AST 39  ALT 21  ALKPHOS 111  BILITOT 2.8*  PROT 7.1  ALBUMIN 3.1*   No results for input(s): LIPASE, AMYLASE in the last 168 hours. No results for input(s): AMMONIA in the last 168 hours. Coagulation Profile: Recent Labs  Lab 12/28/18 1829 12/29/18 1942  INR 2.75 3.33   Cardiac Enzymes: Recent Labs  Lab 12/28/18 1821 12/29/18 1942  TROPONINI 0.05* 0.05*   BNP (last 3 results) No results for input(s): PROBNP in the last 8760 hours. HbA1C: No results for input(s): HGBA1C in the last  72 hours. CBG: No results for input(s): GLUCAP in the last 168 hours. Lipid Profile: No results for input(s): CHOL, HDL, LDLCALC, TRIG, CHOLHDL, LDLDIRECT in the last 72 hours. Thyroid Function Tests: No results for input(s): TSH, T4TOTAL, FREET4, T3FREE, THYROIDAB in the last 72 hours. Anemia Panel: No results for input(s): VITAMINB12, FOLATE, FERRITIN, TIBC, IRON, RETICCTPCT in the last 72 hours. Urine analysis:    Component Value Date/Time   COLORURINE YELLOW 12/29/2018 1943   APPEARANCEUR CLEAR 12/29/2018 1943   LABSPEC 1.015 12/29/2018 1943   PHURINE 5.0 12/29/2018 [place] 12/29/2018 [place] (A) 12/29/2018 [place] 12/29/2018 [place] 12/29/2018 [place] NEGATIVE 12/29/2018 1943   NITRITE NEGATIVE 12/29/2018 1943   LEUKOCYTESUR MODERATE (A) 12/29/2018 1943    Radiological Exams on Admission: Dg Chest 2 View  Result Date: 12/29/2018 CLINICAL DATA:  Worsening shortness of breath EXAM: CHEST - 2 VIEW COMPARISON:  12/28/2018. FINDINGS: Chronic enlargement of the cardiac silhouette. Pacemaker/AICD remains in place. Persistent right effusion with right base atelectasis. Pulmonary venous hypertension. IMPRESSION: Congestive heart failure with enlarged cardiac silhouette,  venous hypertension, right effusion and right base atelectasis. Similar appearance to the study of yesterday. Electronically Signed   By: Nelson Chimes M.D.   On: 12/29/2018 20:55   Dg Chest 2 View  Result Date: 12/28/2018 CLINICAL DATA:  Worsening shortness of breath since last night. Dyspnea. CHF. EXAM: CHEST - 2 VIEW COMPARISON:  Radiograph 12/04/2018, additional priors FINDINGS: Left-sided pacemaker with leads unchanged in position. Stable prominent cardiomegaly. Right pleural effusion appears chronic with associated atelectasis, present dating back to abdominal CT 06/17/2018. Possible trace left pleural effusion. No pulmonary edema. No acute airspace disease. No pneumothorax. Unchanged osseous structures. IMPRESSION: 1. Chronic right pleural effusion with associated atelectasis. Possible trace left pleural effusion. 2. Stable marked cardiomegaly. No pulmonary edema or new focal airspace disease. Electronically Signed   By: Keith Rake M.D.   On: 12/28/2018 19:13    EKG: Independently reviewed.  Assessment/Plan Principal Problem:   Acute on chronic systolic congestive heart failure (HCC) Active Problems:   Essential hypertension   Glucose intolerance (impaired glucose tolerance)   Biventricular ICD (implantable cardioverter-defibrillator) in place   CKD (chronic kidney disease) stage 3, GFR 30-59 ml/min (HCC)    1. Acute on chronic systolic CHF - 1. CHF pathway 2. 2d echo 3. Lasix: 40mg  IV x1 in ED and 40mg  IV BID for the moment 4. Will give AM dose now since still somewhat SOB 5. 750cc uop after ED dose. 6. Daily BMP 7. Continue Amiodarone 8. Cant tolerate aldactone nor ACEi due to renal insufficiency 2. HTN - 1. Continue coreg, 6.25mg  BID ordered for the moment pending completion of his med rec (cardiologist note from 1/17 says he takes 12.5 BID in assessment and plan but only 6.25 BID (half tab) in med list).  Pharm to complete med rec 3. H/o Glucose intolerance - 1. Looks  like he was on jardiance previously but not at the moment 2. Will check CBGs AC/HS 4. CKD stage 3 - 1. Daily BMP with diuresis 2. Creat of 1.7 today actually looks slightly better than usual baseline of 2.0 5. H/o Apical mural thrombus - continue coumadin 6. H/o AICD / biventricular PPM - 1. Last checked earlier this month per cards office note 2. No problems with device and no arrhythmias noted.  DVT prophylaxis: Coumadin Code Status: Full Family Communication: No family in  room Disposition Plan: Home after admit Consults called: None Admission status: Admit to inpatient  Severity of Illness: The appropriate patient status for this patient is INPATIENT. Inpatient status is judged to be reasonable and necessary in order to provide the required intensity of service to ensure the patient's safety. The patient's presenting symptoms, physical exam findings, and initial radiographic and laboratory data in the context of their chronic comorbidities is felt to place them at high risk for further clinical deterioration. Furthermore, it is not anticipated that the patient will be medically stable for discharge from the hospital within 2 midnights of admission. The following factors support the patient status of inpatient.   " The patient's presenting symptoms include SOB, leg swelling. " The worrisome physical exam findings include severe edema, rhonchi, SOB. " The initial radiographic and laboratory data are worrisome because of Pulm edema on CXR, BNP > 4500. " The chronic co-morbidities include Severe CHF with EF less than 20%.   * I certify that at the point of admission it is my clinical judgment that the patient will require inpatient hospital care spanning beyond 2 midnights from the point of admission due to high intensity of service, high risk for further deterioration and high frequency of surveillance required.Etta Quill DO [facility] Pager [phone] Only works  nights!  If 7AM-7PM, please contact the primary day team physician taking care of patient  [email or web address] Password [facility]  12/30/2018, 2:53 AM

## 2018-12-30 NOTE — Consult Note (Addendum)
Advanced Heart Failure Team Consult Note   Primary Physician: [name]., MD PCP-Cardiologist: [place] Dr Raynald Blend Reason for Consultation: Heart Failure   HPI:    Spencer Rice is seen today for evaluation of heart failure at the request of Dr Renne Crigler.   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    He has been seen multiple times in the ED over the last 6 months with increased dyspnea.  He was seen 12/28/2018 , 12/13/18 , 11/22/18, 10/01/18 and 06/2018.   Prior to admit he was sleeping in the recliner and had difficulty walking due to increased dyspnea. Says he drinks lots of fluids. He is on 2 liters chronically. He was seen at [facility] on 1/25 with increased dyspnea and leg edema. He was given 80 mg IV lasix and felt a little bit better so he went home. Unfortunately he had increase dyspnea again and returned to the [facility].   Yesterday he returned to [place] with increased dyspnea. He was started on 40 mg IV lasix and admitted for HF Team consultation. CXR with vascular congsetion. Pertinent admission labs:  BNP 4500, creatinine 1.7, hgb 13.6, K 3.9, and troponin 0.05.   2019 EF 20 % at [facility]  2016 Echo 10%. Grade I DD bilatrial enlargement.      LHC 2015  Mid %RCA  30%. Alfredia Ferguson D 1 30%>   Review of Systems: [y] = yes, [ ]  = no   General: Weight gain [Y ]; Weight loss [ ] ; Anorexia [ ] ; Fatigue [Y ]; Fever [ ] ; Chills [ ] ; Weakness [Y ]  Cardiac: Chest pain/pressure [ ] ; Resting SOB [Y ]; Exertional SOB [ Y]; Orthopnea [ Y]; Pedal Edema [ Y]; Palpitations [ ] ; Syncope [ ] ; Presyncope [ ] ; Paroxysmal nocturnal dyspnea[ ]   Pulmonary: Cough [ ] ; Wheezing[ ] ; Hemoptysis[ ] ; Sputum [ ] ; Snoring [ ]   GI: Vomiting[ ] ; Dysphagia[ ] ; Melena[ ] ; Hematochezia [ ] ; Heartburn[ ] ; Abdominal pain [ ] ;  Constipation [ ] ; Diarrhea [ ] ; BRBPR [ ]   GU: Hematuria[ ] ; Dysuria [ ] ; Nocturia[ ]   Vascular: Pain in legs with walking [ ] ; Pain in feet with lying flat [ ] ; Non-healing sores [ ] ; Stroke [ ] ; TIA [ ] ; Slurred speech [ ] ;  Neuro: Headaches[ ] ; Vertigo[ ] ; Seizures[ ] ; Paresthesias[ ] ;Blurred vision [ ] ; Diplopia [ ] ; Vision changes [ ]   Ortho/Skin: Arthritis [ ] ; Joint pain [Y ]; Muscle pain [ ] ; Joint swelling [ ] ; Back Pain [Y ]; Rash [ ]   Psych: Depression[ ] ; Anxiety[ ]   Heme: Bleeding problems [ ] ; Clotting disorders [ ] ; Anemia [ ]   Endocrine: Diabetes [Y ]; Thyroid dysfunction[ ]   Home Medications Prior to Admission medications   Medication Sig Start Date End Date Taking? Authorizing Provider  amiodarone (PACERONE) 200 MG tablet Take 200 mg by mouth daily.   Yes [provider]  aspirin 81 MG chewable tablet Chew 81 mg by mouth.   Yes [provider]  carvedilol (COREG) 12.5 MG tablet Take 6.25 mg by mouth 2 (two) times daily. Taking 1/2 tablet (6.25mg ) twice daily 11/05/18  Yes [provider]  esomeprazole (NEXIUM) 20 MG capsule Take 20 mg by mouth daily as needed (FOR HEARTBURN).   Yes [provider]  JARDIANCE 10 MG TABS tablet Take 10 mg by mouth daily. 03/06/18  Yes [provider]  mometasone-formoterol (DULERA) 200-5 MCG/ACT AERO Inhale 2 puffs into the lungs 2 (two) times daily.   Yes [provider]  Multiple Vitamin (MULTIVITAMIN WITH MINERALS) TABS tablet Take 1 tablet by mouth daily.   Yes [provider]  omeprazole (PRILOSEC) 20 MG capsule Take 20 mg by mouth daily.   Yes [provider]  potassium chloride (K-DUR,KLOR-CON) 10 MEQ tablet Take 10 mEq by mouth every other day. 12/20/18  Yes [provider]  sucralfate (CARAFATE) 1 g tablet Take 1 tablet (1 g total) by mouth 4 (four) times daily -  with meals and at bedtime. 06/11/18  Yes Julianne Rice, MD  tamsulosin (FLOMAX) 0.4 MG CAPS  capsule Take 0.4 mg by mouth.   Yes [provider]  torsemide (DEMADEX) 20 MG tablet Take 40 mg by mouth 2 (two) times daily. 12/20/18  Yes [provider]  warfarin (COUMADIN) 5 MG tablet Take 2.5 mg by mouth daily. Take 2.5 mg daily except 5 mg Thursday   Yes [provider]  benzonatate (TESSALON) 100 MG capsule Take 1 capsule (100 mg total) by mouth every 8 (eight) hours. Patient not taking: Reported on 12/30/2018 08/15/17   Palumbo, April, MD  carvedilol (COREG) 3.125 MG tablet Take 1 tablet (3.125 mg total) by mouth 2 (two) times daily with a meal. Patient not taking: Reported on 12/30/2018 05/05/18   Leanor Kail, PA  torsemide (DEMADEX) 20 MG tablet Take 1.5 tablet (30mg ) in AM and 1 table (20mg ) in PM daily Patient not taking: Reported on 12/30/2018 05/05/18   Leanor Kail, PA    Past Medical History: Past Medical History:  Diagnosis Date  . AICD (automatic cardioverter/defibrillator) present   . Cancer of left kidney ([facility])    S/P OR 05/2014  . CHF (congestive heart failure) ([facility])   . Coronary artery disease   . DVT (deep venous thrombosis) ([facility]) 1983   LLE  . GERD (gastroesophageal reflux disease)   . History of hiatal hernia   . Hypertension     Past Surgical History: Past Surgical History:  Procedure Laterality Date  . CARDIAC CATHETERIZATION  1980's  . IMPLANTABLE CARDIOVERTER DEFIBRILLATOR IMPLANT  07/21/2010  . IR GENERIC HISTORICAL  12/23/2014   IR RADIOLOGIST EVAL & MGMT 12/23/2014 [name], MD [facility] INTERV RAD  . IR GENERIC HISTORICAL  07/20/2016   IR RADIOLOGIST EVAL & MGMT 07/20/2016 [facility] INTERV RAD  . IR RADIOLOGIST EVAL & MGMT  07/04/2017  . RENAL CRYOABLATION Left 05/2014    Family History: Family History  Problem Relation Age of Onset  . Heart disease Father   . Heart attack Father 73  . Hypertension Father   . Hypertension Mother     Social History: Social History   Socioeconomic History  . Marital status:  Married    Spouse name: Not on file  . Number of children: Not on file  . Years of education: Not on file  . Highest education level: Not on file  Occupational History  . Not on file  Social Needs  . Financial resource strain: Not on file  . Food insecurity:    Worry: Not on file    Inability: Not on file  . Transportation needs:    Medical: Not on file    Non-medical: Not on file  Tobacco Use  . Smoking status: Former Smoker    Packs/day: 0.75  Years: 17.00    Pack years: 12.75    Types: Cigarettes    Start date: 05/08/1959    Last attempt to quit: 06/20/1976    Years since quitting: 42.5  . Smokeless tobacco: Never Used  Substance and Sexual Activity  . Alcohol use: No  . Drug use: No  . Sexual activity: Not on file  Lifestyle  . Physical activity:    Days per week: Not on file    Minutes per session: Not on file  . Stress: Not on file  Relationships  . Social connections:    Talks on phone: Not on file    Gets together: Not on file    Attends religious service: Not on file    Active member of club or organization: Not on file    Attends meetings of clubs or organizations: Not on file    Relationship status: Not on file  Other Topics Concern  . Not on file  Social History Narrative  . Not on file    Allergies:  No Known Allergies  Objective:    Vital Signs:   Temp:  [96 F (35.6 C)-97.6 F (36.4 C)] 97.5 F (36.4 C) (01/27 0608) Pulse Rate:  [69-71] 71 (01/27 0608) Resp:  [14-24] 24 (01/27 0000) BP: (100-106)/(72-82) 100/72 (01/27 0608) SpO2:  [100 %] 100 % (01/27 0608) Weight:  [92.4 kg-93.4 kg] 92.4 kg (01/27 0112) Last BM Date: 12/29/18  Weight change: Filed Weights   12/29/18 1657 12/30/18 0112  Weight: 93.4 kg 92.4 kg    Intake/Output:   Intake/Output Summary (Last 24 hours) at 12/30/2018 0855 Last data filed at 12/30/2018 0148 Gross per 24 hour  Intake 222 ml  Output 725 ml  Net -503 ml      Physical Exam    General:  Sitting in  the chair  No resp difficulty HEENT: normal Neck: supple. JVP to jaw . Carotids 2+ bilat; no bruits. No lymphadenopathy or thyromegaly appreciated. Cor: PMI nondisplaced. Regular rate & rhythm. No rubs, gallops. 2/6 LSB . Lungs: clear on 2 liters oxygen  Abdomen: soft, nontender, nondistended. No hepatosplenomegaly. No bruits or masses. Good bowel sounds. Extremities: no cyanosis, clubbing, rash, R and LLE 3+ edema Neuro: alert & orientedx3, cranial nerves grossly intact. moves all 4 extremities w/o difficulty. Affect pleasant   Telemetry    A sensed V paced 70 bpm   EKG    A sensed V paced QRS 200 ms   Labs   Basic Metabolic Panel: Recent Labs  Lab 12/28/18 1803 12/29/18 1942  NA 137 137  K 3.7 3.9  CL 98 98  CO2 29 27  GLUCOSE 131* 113*  BUN 33* 34*  CREATININE 1.99* 1.77*  CALCIUM 8.8* 8.9    Liver Function Tests: Recent Labs  Lab 12/29/18 1942  AST 39  ALT 21  ALKPHOS 111  BILITOT 2.8*  PROT 7.1  ALBUMIN 3.1*   No results for input(s): LIPASE, AMYLASE in the last 168 hours. No results for input(s): AMMONIA in the last 168 hours.  CBC: Recent Labs  Lab 12/28/18 1803 12/29/18 1942  WBC 5.7 5.7  NEUTROABS  --  4.0  HGB 13.9 13.6  HCT 43.8 43.1  MCV 81.3 81.9  PLT 139* 144*    Cardiac Enzymes: Recent Labs  Lab 12/28/18 1821 12/29/18 1942  TROPONINI 0.05* 0.05*    BNP: BNP (last 3 results) Recent Labs    12/04/18 1743 12/28/18 1821 12/29/18 1943  BNP >4,500.0* >4,925.0* >4,500.0*  ProBNP (last 3 results) No results for input(s): PROBNP in the last 8760 hours.   CBG: No results for input(s): GLUCAP in the last 168 hours.  Coagulation Studies: Recent Labs    12/28/18 1829 12/29/18 1942  LABPROT 28.7* 33.3*  INR 2.75 3.33     Imaging   Dg Chest 2 View  Result Date: 12/29/2018 CLINICAL DATA:  Worsening shortness of breath EXAM: CHEST - 2 VIEW COMPARISON:  12/28/2018. FINDINGS: Chronic enlargement of the cardiac  silhouette. Pacemaker/AICD remains in place. Persistent right effusion with right base atelectasis. Pulmonary venous hypertension. IMPRESSION: Congestive heart failure with enlarged cardiac silhouette, venous hypertension, right effusion and right base atelectasis. Similar appearance to the study of yesterday. Electronically Signed   By: Nelson Chimes M.D.   On: 12/29/2018 20:55      Medications:     Current Medications: . ALPRAZolam  0.25 mg Oral Once  . amiodarone  200 mg Oral Daily  . carvedilol  6.25 mg Oral BID WC  . furosemide  40 mg Intravenous BID  . mouth rinse  15 mL Mouth Rinse BID  . sodium chloride flush  3 mL Intravenous Once  . sodium chloride flush  3 mL Intravenous Q12H     Infusions: . 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    Admitted with marked volume overload.   Assessment/Plan  1. A/C Systolic Heart Failure, NICM  , LHC 2015 nonobstructive disease. Has Biotronik BiV. Repeat ECHO pending.  -Marked volume overload. Increase lasix to 80 mg twice a day and see how he responds. -I am concerned he may have low output heart failure. Place PICC and check CO-OX  and CVP. May need to add inotropes if low.  -Stop carvedilol for now.   2. CKD Stage III, creatinine baseline 1.8-2.4  -Will need to follow renal funciton closely   3. LBBB Biotronik to interrogate. QRS has widened over the last few months.   4. COPD on chronic home oxygen -Sats stable on 2 liters oxygen.   5. L Kidney Cancer  -Had cryoablation in 2015 -Watch renal function closely.   6. PVCs  Started on amiodarone 2019 to suppress PVCs.  -Continue amiodarone 200 mg daily.  -Check TSH -LFTs ok.   7. H/O [place] Thrombus -On coumadin. Pharmacy to dose coumadin.   8. 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  -ECHO pending.   Will likely need [place] conversation.   Medication concerns reviewed with patient and pharmacy team. Barriers identified: no   Length of Stay: 1  Amy Clegg, NP  12/30/2018, 8:55 AM  Advanced Heart Failure Team Pager [phone] (M-F; 7a - 4p)  Please contact [facility] for night-coverage after hours (4p -7a ) and weekends on [email or web address]  Patient seen and examined with the above-signed Advanced Practice Provider and/or Housestaff. I personally reviewed laboratory data, imaging studies and relevant notes. I independently examined the patient and formulated the important aspects of the plan. I have edited the note to reflect any of my changes or salient points. I have personally discussed the plan with the patient and/or family.  78 y/o male with longstanding NICM with severe systolic HF (EF 97%) followed by [facility]. He is s/p Biotronix CRT.  He also has a h/o CKD III-IV s/p  cryoablation of L RCC.Has had multiple hospitalizations for HF.  Now presents with NYHA IV symptoms and marked volume overload.   On exam JVP to jaw Cor RRR +s3 3/6 Spencer/TR Lungs decreased at bases Ab soft NT EXT 3-4+ edema wrapped  He likely has end-stage systolic HF with low output and severe Spencer/TR. Will continue IV diuresis. Place PICC to measure CVP and co-ox. Will likely need to consider if he is a candidate for advanced therapies.  Glori Bickers, MD  1:07 PM

## 2018-12-31 ENCOUNTER — Inpatient Hospital Stay: Payer: Self-pay

## 2018-12-31 ENCOUNTER — Inpatient Hospital Stay (HOSPITAL_COMMUNITY): Payer: Medicare Other

## 2018-12-31 DIAGNOSIS — I509 Heart failure, unspecified: Secondary | ICD-10-CM

## 2018-12-31 LAB — BASIC METABOLIC PANEL
Anion gap: 15 (ref 5–15)
BUN: 33 mg/dL — ABNORMAL HIGH (ref 8–23)
CO2: 28 mmol/L (ref 22–32)
Calcium: 9 mg/dL (ref 8.9–10.3)
Chloride: 97 mmol/L — ABNORMAL LOW (ref 98–111)
Creatinine, Ser: 2.04 mg/dL — ABNORMAL HIGH (ref 0.61–1.24)
GFR calc Af Amer: 35 mL/min — ABNORMAL LOW (ref >60)
GFR calc non Af Amer: 31 mL/min — ABNORMAL LOW (ref >60)
Glucose, Bld: 98 mg/dL (ref 70–99)
Potassium: 3.9 mmol/L (ref 3.5–5.1)
SODIUM: 140 mmol/L (ref 135–145)

## 2018-12-31 LAB — COOXEMETRY PANEL
CARBOXYHEMOGLOBIN: 1.2 % (ref 0.5–1.5)
CARBOXYHEMOGLOBIN: 1.5 % (ref 0.5–1.5)
Methemoglobin: 0.9 % (ref 0.0–1.5)
Methemoglobin: 1 % (ref 0.0–1.5)
O2 Saturation: 38.8 %
O2 Saturation: 59.7 %
Total hemoglobin: 13.5 g/dL (ref 12.0–16.0)
Total hemoglobin: 13 g/dL (ref 12.0–16.0)

## 2018-12-31 LAB — CBC
HCT: 39.9 % (ref 39.0–52.0)
Hemoglobin: 13 g/dL (ref 13.0–17.0)
MCH: 25.8 pg — ABNORMAL LOW (ref 26.0–34.0)
MCHC: 32.6 g/dL (ref 30.0–36.0)
MCV: 79.2 fL — ABNORMAL LOW (ref 80.0–100.0)
Platelets: 127 10*3/uL — ABNORMAL LOW (ref 150–400)
RBC: 5.04 MIL/uL (ref 4.22–5.81)
RDW: 20.4 % — ABNORMAL HIGH (ref 11.5–15.5)
WBC: 4.8 10*3/uL (ref 4.0–10.5)
nRBC: 0 % (ref 0.0–0.2)

## 2018-12-31 LAB — PROTIME-INR
INR: 3.59
Prothrombin Time: 35.3 seconds — ABNORMAL HIGH (ref 11.4–15.2)

## 2018-12-31 MED ORDER — POTASSIUM CHLORIDE CRYS ER 20 MEQ PO TBCR
40.0000 meq | EXTENDED_RELEASE_TABLET | Freq: Once | ORAL | Status: AC
  Administered 2018-12-31: 40 meq via ORAL
  Filled 2018-12-31: qty 2

## 2018-12-31 MED ORDER — METOLAZONE 5 MG PO TABS
2.5000 mg | ORAL_TABLET | Freq: Once | ORAL | Status: AC
  Administered 2018-12-31: 2.5 mg via ORAL
  Filled 2018-12-31: qty 1

## 2018-12-31 MED ORDER — FUROSEMIDE 10 MG/ML IJ SOLN
15.0000 mg/h | INTRAVENOUS | Status: DC
  Administered 2018-12-31 – 2019-01-03 (×3): 15 mg/h via INTRAVENOUS
  Filled 2018-12-31 (×5): qty 25

## 2018-12-31 MED ORDER — MILRINONE LACTATE IN DEXTROSE 20-5 MG/100ML-% IV SOLN
0.2500 ug/kg/min | INTRAVENOUS | Status: DC
  Administered 2018-12-31 – 2019-01-18 (×29): 0.25 ug/kg/min via INTRAVENOUS
  Filled 2018-12-31 (×32): qty 100

## 2018-12-31 MED ORDER — FUROSEMIDE 10 MG/ML IJ SOLN
40.0000 mg | Freq: Once | INTRAMUSCULAR | Status: AC
  Administered 2018-12-31: 40 mg via INTRAVENOUS
  Filled 2018-12-31: qty 4

## 2018-12-31 NOTE — Progress Notes (Addendum)
Advanced Heart Failure Rounding Note  PCP-Cardiologist: No primary care provider on file.   Subjective:   Yesterday diuresed with IV lasix. Sluggish diuresis noted. Creatinine trending up 1.7>2.   ECHO pending.   Remains short of breath at rest.   Objective:   Weight Range: 92.3 kg Body mass index is 26.48 kg/m.   Vital Signs:   Temp:  [97.5 F (36.4 C)-98.1 F (36.7 C)] 97.5 F (36.4 C) (01/28 0735) Pulse Rate:  [70] 70 (01/28 0735) Resp:  [18-21] 20 (01/28 0735) BP: (105-111)/(74-82) 105/76 (01/28 0735) SpO2:  [99 %-100 %] 99 % (01/28 0735) Weight:  [92.3 kg] 92.3 kg (01/28 0338) Last BM Date: 12/30/18  Weight change: Filed Weights   12/29/18 1657 12/30/18 0112 12/31/18 0338  Weight: 93.4 kg 92.4 kg 92.3 kg    Intake/Output:   Intake/Output Summary (Last 24 hours) at 12/31/2018 0840 Last data filed at 12/31/2018 0345 Gross per 24 hour  Intake 990 ml  Output 700 ml  Net 290 ml      Physical Exam   CVP 26 General:  Sitting in the chair. . No resp difficulty HEENT: Normal Neck: Supple. JVP to ear. Carotids 2+ bilat; no bruits. No lymphadenopathy or thyromegaly appreciated. Cor: PMI nondisplaced. Regular rate & rhythm. No rubs. + S3 2/6 LSB Lungs: Clear on 2 liters oxygen.  Abdomen: Soft, nontender, nondistended. No hepatosplenomegaly. No bruits or masses. Good bowel sounds. Extremities: No cyanosis, clubbing, rash, R and LLE 2+ edema Neuro: Alert & orientedx3, cranial nerves grossly intact. moves all 4 extremities w/o difficulty. Affect pleasant   Telemetry  A sensed V paced 70-80s   EKG    N/a   Labs    CBC Recent Labs    12/29/18 1942 12/31/18 0320  WBC 5.7 4.8  NEUTROABS 4.0  --   HGB 13.6 13.0  HCT 43.1 39.9  MCV 81.9 79.2*  PLT 144* 595*   Basic Metabolic Panel Recent Labs    12/29/18 1942 12/31/18 0320  NA 137 140  K 3.9 3.9  CL 98 97*  CO2 27 28  GLUCOSE 113* 98  BUN 34* 33*  CREATININE 1.77* 2.04*  CALCIUM 8.9 9.0     Liver Function Tests Recent Labs    12/29/18 1942  AST 39  ALT 21  ALKPHOS 111  BILITOT 2.8*  PROT 7.1  ALBUMIN 3.1*   No results for input(s): LIPASE, AMYLASE in the last 72 hours. Cardiac Enzymes Recent Labs    12/28/18 1821 12/29/18 1942  TROPONINI 0.05* 0.05*    BNP: BNP (last 3 results) Recent Labs    12/04/18 1743 12/28/18 1821 12/29/18 1943  BNP >4,500.0* >4,925.0* >4,500.0*    ProBNP (last 3 results) No results for input(s): PROBNP in the last 8760 hours.   D-Dimer No results for input(s): DDIMER in the last 72 hours. Hemoglobin A1C No results for input(s): HGBA1C in the last 72 hours. Fasting Lipid Panel No results for input(s): CHOL, HDL, LDLCALC, TRIG, CHOLHDL, LDLDIRECT in the last 72 hours. Thyroid Function Tests No results for input(s): TSH, T4TOTAL, T3FREE, THYROIDAB in the last 72 hours.  Invalid input(s): FREET3  Other results:   Imaging    Dg Chest Port 1 View  Result Date: 12/30/2018 CLINICAL DATA:  Malposition PICC pulled back and flushed. EXAM: PORTABLE CHEST 1 VIEW COMPARISON:  Earlier this day at 1904 hour FINDINGS: Right upper extremity PICC has been retracted, tip now appropriately position in the mid SVC, no longer looped.  Unchanged cardiomegaly. Multi lead left-sided pacemaker in place. Unchanged right pleural effusion and associated basilar airspace disease. No pneumothorax. No new abnormalities. IMPRESSION: Right upper extremity PICC has been retracted, tip now appropriately position in the mid SVC. Unchanged cardiomegaly and right pleural effusion with associated basilar atelectasis/airspace disease. Electronically Signed   By: Keith Rake M.D.   On: 12/30/2018 20:37   Dg Chest Port 1 View  Result Date: 12/30/2018 CLINICAL DATA:  78 year old male with a history of malpositioned PICC EXAM: PORTABLE CHEST 1 VIEW COMPARISON:  12/30/2018, 12/29/2018 FINDINGS: The cardiomediastinal silhouette is unchanged with cardiomegaly.  Similar appearance of cardiac pacing device/AICD. There has been interval repositioning of the right upper extremity PICC. There is now a redundant course of the PICC, with 180 degree loop on the tip. Similar appearance of opacity at the right lung base with partial obscuration of the right hemidiaphragm. IMPRESSION: Interval repositioning of the right upper extremity PICC, which appears now redundant in the SVC. Persisting right pleural effusion and associated atelectasis/consolidation. Cardiomegaly. Unchanged cardiac AICD. Electronically Signed   By: Corrie Mckusick D.O.   On: 12/30/2018 19:24   Dg Chest Port 1 View  Result Date: 12/30/2018 CLINICAL DATA:  Peripherally inserted central catheter. EXAM: PORTABLE CHEST 1 VIEW COMPARISON:  12/29/2018 FINDINGS: 1752 hours. Cardiopericardial silhouette is markedly enlarged. Vascular congestion again noted with probable interstitial edema. There is right base atelectasis or infiltrate with small right pleural effusion, stable. Permanent pacemaker again noted. Right PICC line tip overlies the proximal SVC level. Catheter could be advanced 7.5 cm for tip positioning in the region of the SVC/RA junction. The visualized bony structures of the thorax are intact. IMPRESSION: Right PICC line tip overlies the proximal SVC level. Catheter could be advanced 7.5 cm for tip positioning in the region of the SVC/RA junction, as warranted. Marked enlargement of the cardiopericardial silhouette with similar appearance of right base atelectasis and effusion. Electronically Signed   By: Misty Stanley M.D.   On: 12/30/2018 18:11   [place] Ekg Site Rite  Result Date: 12/30/2018 If Site Rite image not attached, placement could not be confirmed due to current cardiac rhythm.     Medications:     Scheduled Medications: . ALPRAZolam  0.25 mg Oral Once  . amiodarone  200 mg Oral Daily  . furosemide  80 mg Intravenous BID  . mouth rinse  15 mL Mouth Rinse BID  . sodium chloride  flush  3 mL Intravenous Once  . sodium chloride flush  3 mL Intravenous Q12H     Infusions: . 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    Admitted with marked volume overload.    Assessment/Plan  1. A/C Systolic Heart Failure, NICM  , LHC 2015 nonobstructive disease. Has Biotronik BiV. Repeat ECHO pending.  -PICC placed. CVP 26. Give 80 mg IV lasix and start lasix drip 10 mg per hour.  - Check CO-OX now. Anticipate starting milrinone.  - No spiro/arb/ dig with CKD.  - Add ted hose.   2. CKD Stage III, creatinine baseline 1.8-2.4  Creatinine trending up.    3. LBBB Biotronik to interrogate. QRS has widened over the  last few months.   4. COPD on chronic home oxygen -Sats stable on 2 liters oxygen.   5. L Kidney Cancer  -Had cryoablation in 2015 -Watch renal function closely.   6. PVCs  Started on amiodarone 2019 to suppress PVCs.  -Continue amiodarone 200 mg daily.  -Check TSH -LFTs ok.   7. H/O Mural Thrombus -On coumadin. Pharmacy to dose coumadin.   8. Suspected Mitral Regurgitation.  -ECHO pending.   ECHO pending. CO-OX pending.    Length of Stay: 2  Darrick Grinder, NP  12/31/2018, 8:40 AM  Advanced Heart Failure Team Pager [phone] (M-F; [place])  Please contact [facility] for night-coverage after hours (4p -7a ) and weekends on [email or web address]   Agree with above.   He is severely dyspneic at rest. Co-ox 38% c/w with cardiogenic shock. CVP 27. Will start IV milrinone and increase lasix. Start lasix gtt.   On exam  Sitting in chair dyspneic CVP to ear Cor RRR + s3 loud MR/ TR Lungs + crackles Ab distended Ext 3+ edema  cool  He is critically ill with end-stage HFm, marked volume overload and cardiogenic shock physiology. Will start inotropes and high-dose lasix. Low threshold to start BIPAP and move to ICU. Options very limited at this point. Await echo images.   CRITICAL CARE Performed by: Glori Bickers  Total critical care time: 35 minutes  Critical care time was exclusive of separately billable procedures and treating other patients.  Critical care was necessary to treat or prevent imminent or life-threatening deterioration.  Critical care was time spent personally by me (independent of midlevel providers or residents) on the following activities: development of treatment plan with patient and/or surrogate as well as nursing, discussions with consultants, evaluation of patient's response to treatment, examination of patient, obtaining history from patient or surrogate, ordering and performing treatments and interventions, ordering and review of laboratory studies, ordering and review of radiographic studies, pulse oximetry and re-evaluation of patient's condition.  Glori Bickers, MD  1:26 PM

## 2018-12-31 NOTE — Progress Notes (Signed)
PROGRESS NOTE  Spencer Rice YPP:[id number] DOB: 15-Sep-1941 DOA: 12/29/2018 PCP: [name]., MD   LOS: 2 days   No charge note, admitted overnight  Brief Narrative / Interim history: 78 year old male with history of systolic CHF with EF less than 20%, history of biventricular fibrillator, hypertension, chronic kidney disease stage III, COPD on chronic oxygen at home 2 L, prior history of kidney cancer status post cryoablation in 2015 who had several ED visits over the last month with progressive shortness of breath, he was also seen as an outpatient in his cardiology office, his diuretics were adjusted but despite that he has had progressive worsening shortness of breath and eventually was admitted to the hospital on 1/27.  Reports feeling extremely short of breath every time he even tries to lay back and he has been sitting upright and sleeping in a chair over the last few days.  He can barely ambulate due to dyspnea.  In the ED he was found to be marked fluid overloaded on chest x-ray, exam, and BNP was about 4500.  He was placed on IV Lasix and was admitted to the hospital.  Subjective: -Felt a little bit more short of breath overnight, but he is okay this morning.  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  PVCs.  -Increase Lasix to 80 twice daily, stop Coreg, now has a PICC line to evaluate COox / CVP with consideration for inotropes.  Heart failure to follow today but suspect will need inotropes given elevated CVP as well as worsening renal function with poor urine output  Active Problems Hypertension -Blood pressure stable, continue furosemide, off Coreg now  Chronic kidney disease stage III -Baseline creatinine around 1.8-2.2, even as far high as 2.4 -Creatinine on admission 1.77, 2.0 this morning, slightly worse, suspect cardiorenal syndrome   History of apical mural thrombus -Continue Coumadin  COPD on chronic home O2 -No wheezing, this appears to be stable, continue 2 L nasal cannula which is his baseline  Left kidney cancer -Status post cryoablation in 2015  Bilateral lower extremity wounds -Currently Ace wrap, consulted wound care.  Ace wrap to be changed tomorrow.  No open wounds per patient and wound care nurse, but wraps were now actually removed yesterday, monitor tomorrow  Scheduled Meds: . ALPRAZolam  0.25 mg Oral Once  . amiodarone  200 mg Oral Daily  . furosemide  80 mg Intravenous BID  . mouth rinse  15 mL Mouth Rinse BID  . sodium chloride flush  3 mL Intravenous Once  . sodium chloride flush  3 mL Intravenous Q12H   Continuous Infusions: . sodium chloride     PRN Meds:.sodium chloride, acetaminophen, ondansetron (ZOFRAN) IV, sodium chloride flush, sodium chloride flush  DVT prophylaxis: Coumadin Code Status: Full code Family Communication: no family at bedside  Disposition  Plan: TBD  Consultants:   Cardiology - heart failure  Procedures:   2D echo: pending  Antimicrobials:  None    Objective: Vitals:   12/30/18 1441 12/30/18 2014 12/31/18 0338 12/31/18 0735  BP: 106/82 111/74 106/74 105/76  Pulse: 70 70 70 70  Resp:  (!) 21 18 20   Temp: (!) 97.5 F (36.4 C) (!) 97.5  F (36.4 C) 98.1 F (36.7 C) (!) 97.5 F (36.4 C)  TempSrc: Oral     SpO2: 100% 100% 100% 99%  Weight:   92.3 kg   Height:        Intake/Output Summary (Last 24 hours) at 12/31/2018 0849 Last data filed at 12/31/2018 0345 Gross per 24 hour  Intake 990 ml  Output 700 ml  Net 290 ml   Filed Weights   12/29/18 1657 12/30/18 0112 12/31/18 0338  Weight: 93.4 kg 92.4 kg 92.3 kg    Examination:  Constitutional: No acute distress but sleeping in the recliner sitting upright Eyes: No scleral icterus ENMT: Moist mucous membranes Neck: normal, supple Respiratory: Decreased bibasilar sounds, no wheezing, faint crackles at the bases Cardiovascular: Regular rate and rhythm, 3/6 SEM, + JVD Abdomen: Soft, nontender Musculoskeletal: no clubbing / cyanosis. Skin: No new rashes, bilateral lower extremity Ace wrapped Neurologic: No focal deficits, equal strength Psychiatric: Normal judgment and insight. Alert and oriented x 3. Normal mood.    Data Reviewed: I have independently reviewed following labs and imaging studies   CBC: Recent Labs  Lab 12/28/18 1803 12/29/18 1942 12/31/18 0320  WBC 5.7 5.7 4.8  NEUTROABS  --  4.0  --   HGB 13.9 13.6 13.0  HCT 43.8 43.1 39.9  MCV 81.3 81.9 79.2*  PLT 139* 144* 174*   Basic Metabolic Panel: Recent Labs  Lab 12/28/18 1803 12/29/18 1942 12/31/18 0320  NA 137 137 140  K 3.7 3.9 3.9  CL 98 98 97*  CO2 29 27 28   GLUCOSE 131* 113* 98  BUN 33* 34* 33*  CREATININE 1.99* 1.77* 2.04*  CALCIUM 8.8* 8.9 9.0   GFR: Estimated Creatinine Clearance: 34.8 mL/min (A) (by C-G formula based on SCr of 2.04 mg/dL (H)). Liver Function Tests: Recent Labs  Lab 12/29/18 1942  AST 39  ALT 21  ALKPHOS 111  BILITOT 2.8*  PROT 7.1  ALBUMIN 3.1*   No results for input(s): LIPASE, AMYLASE in the last 168 hours. No results for input(s): AMMONIA in the last 168 hours. Coagulation Profile: Recent Labs  Lab 12/28/18 1829 12/29/18 1942  12/30/18 0919  INR 2.75 3.33 3.69   Cardiac Enzymes: Recent Labs  Lab 12/28/18 1821 12/29/18 1942  TROPONINI 0.05* 0.05*   BNP (last 3 results) No results for input(s): PROBNP in the last 8760 hours. HbA1C: No results for input(s): HGBA1C in the last 72 hours. CBG: No results for input(s): GLUCAP in the last 168 hours. Lipid Profile: No results for input(s): CHOL, HDL, LDLCALC, TRIG, CHOLHDL, LDLDIRECT in the last 72 hours. Thyroid Function Tests: No results for input(s): TSH, T4TOTAL, FREET4, T3FREE, THYROIDAB in the last 72 hours. Anemia Panel: No results for input(s): VITAMINB12, FOLATE, FERRITIN, TIBC, IRON, RETICCTPCT in the last 72 hours. Urine analysis:    Component Value Date/Time   COLORURINE YELLOW 12/29/2018 1943   APPEARANCEUR CLEAR 12/29/2018 1943   LABSPEC 1.015 12/29/2018 1943   PHURINE 5.0 12/29/2018 [place] 12/29/2018 [place] (A) 12/29/2018 [place] 12/29/2018 [place]  12/29/2018 [place] 12/29/2018 1943   NITRITE NEGATIVE 12/29/2018 [place] (A) 12/29/2018 1943   Sepsis Labs: Invalid input(s): PROCALCITONIN, LACTICIDVEN  Recent Results (from the past 240 hour(s))  Urine culture     Status: Abnormal (Preliminary result)   Collection Time: 12/29/18  7:43 PM  Result Value Ref Range Status   Specimen Description   Final    URINE, RANDOM Performed at [facility], [address] [place], [place] [address]    Special Requests   Final    NONE Performed at [facility], [address] [place], [place] [address]    Culture >=100,000 COLONIES/mL GRAM NEGATIVE RODS (A)  Final   Report Status PENDING  Incomplete      Radiology Studies: Dg Chest 2 View  Result Date: 12/29/2018 CLINICAL DATA:  Worsening shortness of breath EXAM: CHEST - 2 VIEW COMPARISON:  12/28/2018. FINDINGS: Chronic enlargement of the cardiac silhouette.  Pacemaker/AICD remains in place. Persistent right effusion with right base atelectasis. Pulmonary venous hypertension. IMPRESSION: Congestive heart failure with enlarged cardiac silhouette, venous hypertension, right effusion and right base atelectasis. Similar appearance to the study of yesterday. Electronically Signed   By: Nelson Chimes M.D.   On: 12/29/2018 20:55   Dg Chest Port 1 View  Result Date: 12/30/2018 CLINICAL DATA:  Malposition PICC pulled back and flushed. EXAM: PORTABLE CHEST 1 VIEW COMPARISON:  Earlier this day at 1904 hour FINDINGS: Right upper extremity PICC has been retracted, tip now appropriately position in the mid SVC, no longer looped. Unchanged cardiomegaly. Multi lead left-sided pacemaker in place. Unchanged right pleural effusion and associated basilar airspace disease. No pneumothorax. No new abnormalities. IMPRESSION: Right upper extremity PICC has been retracted, tip now appropriately position in the mid SVC. Unchanged cardiomegaly and right pleural effusion with associated basilar atelectasis/airspace disease. Electronically Signed   By: Keith Rake M.D.   On: 12/30/2018 20:37   Dg Chest Port 1 View  Result Date: 12/30/2018 CLINICAL DATA:  78 year old male with a history of malpositioned PICC EXAM: PORTABLE CHEST 1 VIEW COMPARISON:  12/30/2018, 12/29/2018 FINDINGS: The cardiomediastinal silhouette is unchanged with cardiomegaly. Similar appearance of cardiac pacing device/AICD. There has been interval repositioning of the right upper extremity PICC. There is now a redundant course of the PICC, with 180 degree loop on the tip. Similar appearance of opacity at the right lung base with partial obscuration of the right hemidiaphragm. IMPRESSION: Interval repositioning of the right upper extremity PICC, which appears now redundant in the SVC. Persisting right pleural effusion and associated atelectasis/consolidation. Cardiomegaly. Unchanged cardiac AICD. Electronically Signed    By: Corrie Mckusick D.O.   On: 12/30/2018 19:24   Dg Chest Port 1 View  Result Date: 12/30/2018 CLINICAL DATA:  Peripherally inserted central catheter. EXAM: PORTABLE CHEST 1 VIEW COMPARISON:  12/29/2018 FINDINGS: 1752 hours. Cardiopericardial silhouette is markedly enlarged. Vascular congestion again noted with probable interstitial edema. There is right base atelectasis or infiltrate with small right pleural effusion, stable. Permanent pacemaker again noted. Right PICC line tip overlies the proximal SVC level. Catheter could be advanced 7.5 cm for tip positioning in the region of the SVC/RA junction. The visualized bony structures of the thorax are intact. IMPRESSION: Right PICC line tip overlies the proximal SVC level. Catheter could be advanced 7.5 cm for tip positioning in the region of the SVC/RA junction, as warranted. Marked enlargement of the cardiopericardial silhouette with similar appearance of right base  atelectasis and effusion. Electronically Signed   By: Misty Stanley M.D.   On: 12/30/2018 18:11   [place] Ekg Site Rite  Result Date: 12/31/2018 If Site Rite image not attached, placement could not be confirmed due to current cardiac rhythm.  [place] Ekg Site Rite  Result Date: 12/30/2018 If Site Rite image not attached, placement could not be confirmed due to current cardiac rhythm.   Marzetta Board, MD, PhD [facility]  Contact via  [email or web address]  [place] P: [phone]  F: [phone]

## 2018-12-31 NOTE — Progress Notes (Signed)
ANTICOAGULATION CONSULT NOTE - Follow-Up Consult  Pharmacy Consult for Coumadin Indication: apical mural thrombus  No Known Allergies  Patient Measurements: Height: 6' 1.5" (186.7 cm) Weight: 203 lb 8 oz (92.3 kg) IBW/kg (Calculated) : 81.05  Vital Signs: Temp: 97.5 F (36.4 C) (01/28 0735) BP: 94/73 (01/28 1101) Pulse Rate: 70 (01/28 1101)  Labs: Recent Labs    12/28/18 1803 12/28/18 1821  12/29/18 1942 12/30/18 0919 12/31/18 0320 12/31/18 0914  HGB 13.9  --   --  13.6  --  13.0  --   HCT 43.8  --   --  43.1  --  39.9  --   PLT 139*  --   --  144*  --  127*  --   LABPROT  --   --    < > 33.3* 36.0*  --  35.3*  INR  --   --    < > 3.33 3.69  --  3.59  CREATININE 1.99*  --   --  1.77*  --  2.04*  --   TROPONINI  --  0.05*  --  0.05*  --   --   --    < > = values in this interval not displayed.    Estimated Creatinine Clearance: 34.8 mL/min (A) (by C-G formula based on SCr of 2.04 mg/dL (H)).   Medical History: Past Medical History:  Diagnosis Date  . AICD (automatic cardioverter/defibrillator) present   . Cancer of left kidney ([place])    S/P OR 05/2014  . CHF (congestive heart failure) ([place])   . Coronary artery disease   . DVT (deep venous thrombosis) (HCC) 1983   LLE  . GERD (gastroesophageal reflux disease)   . History of hiatal hernia   . Hypertension     Medications:  Medications Prior to Admission  Medication Sig Dispense Refill Last Dose  . amiodarone (PACERONE) 200 MG tablet Take 200 mg by mouth daily.   12/29/2018 at am  . aspirin 81 MG chewable tablet Chew 81 mg by mouth.   12/29/2018 at am  . carvedilol (COREG) 12.5 MG tablet Take 6.25 mg by mouth 2 (two) times daily. Taking 1/2 tablet (6.25mg ) twice daily   12/29/2018 at am  . esomeprazole (NEXIUM) 20 MG capsule Take 20 mg by mouth daily as needed (FOR HEARTBURN).   unk at prn  . JARDIANCE 10 MG TABS tablet Take 10 mg by mouth daily.  1 12/29/2018 at Unknown time  . mometasone-formoterol (DULERA)  200-5 MCG/ACT AERO Inhale 2 puffs into the lungs 2 (two) times daily.   12/29/2018 at Unknown time  . Multiple Vitamin (MULTIVITAMIN WITH MINERALS) TABS tablet Take 1 tablet by mouth daily.   12/29/2018 at Unknown time  . omeprazole (PRILOSEC) 20 MG capsule Take 20 mg by mouth daily.   12/29/2018 at Unknown time  . potassium chloride (K-DUR,KLOR-CON) 10 MEQ tablet Take 10 mEq by mouth every other day.   12/29/2018 at Unknown time  . sucralfate (CARAFATE) 1 g tablet Take 1 tablet (1 g total) by mouth 4 (four) times daily -  with meals and at bedtime. 28 tablet 0 12/29/2018 at Unknown time  . tamsulosin (FLOMAX) 0.4 MG CAPS capsule Take 0.4 mg by mouth.   12/29/2018 at Unknown time  . torsemide (DEMADEX) 20 MG tablet Take 40 mg by mouth 2 (two) times daily.   12/29/2018 at Unknown time  . warfarin (COUMADIN) 5 MG tablet Take 2.5 mg by mouth daily. Take 2.5 mg daily except  5 mg Thursday   12/28/2018 at 1800  . benzonatate (TESSALON) 100 MG capsule Take 1 capsule (100 mg total) by mouth every 8 (eight) hours. (Patient not taking: Reported on 12/30/2018) 21 capsule 0 Not Taking at Unknown time  . carvedilol (COREG) 3.125 MG tablet Take 1 tablet (3.125 mg total) by mouth 2 (two) times daily with a meal. (Patient not taking: Reported on 12/30/2018) 60 tablet 1 Not Taking at Unknown time  . torsemide (DEMADEX) 20 MG tablet Take 1.5 tablet (30mg ) in AM and 1 table (20mg ) in PM daily (Patient not taking: Reported on 12/30/2018) 75 tablet 1 Not Taking at Unknown time   Scheduled:  . ALPRAZolam  0.25 mg Oral Once  . amiodarone  200 mg Oral Daily  . mouth rinse  15 mL Mouth Rinse BID  . sodium chloride flush  3 mL Intravenous Once  . sodium chloride flush  3 mL Intravenous Q12H    Assessment: 69 yoM admitted with volume overload. Pt on warfarin PTA for hx of mural thromus. INR supratherapeutic on admit at 3.69, last dose PTA on 1/25. INR remains above goal despite holding warfarin at 3.59, H/H stable.  *Home Dose =  5mg  Thurs, 2.5mg  all other days  Goal of Therapy:  INR 2-3   Plan:  -Hold warfarin again tonight -Daily INR  Arrie Senate, PharmD, BCPS Clinical Pharmacist [phone] Please check AMION for all [facility] Pharmacy numbers 12/31/2018

## 2019-01-01 ENCOUNTER — Inpatient Hospital Stay (HOSPITAL_COMMUNITY): Payer: Medicare Other

## 2019-01-01 DIAGNOSIS — I5023 Acute on chronic systolic (congestive) heart failure: Secondary | ICD-10-CM

## 2019-01-01 LAB — BASIC METABOLIC PANEL
Anion gap: 15 (ref 5–15)
BUN: 37 mg/dL — AB (ref 8–23)
CHLORIDE: 90 mmol/L — AB (ref 98–111)
CO2: 31 mmol/L (ref 22–32)
Calcium: 9.2 mg/dL (ref 8.9–10.3)
Creatinine, Ser: 2.05 mg/dL — ABNORMAL HIGH (ref 0.61–1.24)
GFR calc Af Amer: 35 mL/min — ABNORMAL LOW (ref >60)
GFR calc non Af Amer: 30 mL/min — ABNORMAL LOW (ref >60)
Glucose, Bld: 133 mg/dL — ABNORMAL HIGH (ref 70–99)
POTASSIUM: 3.2 mmol/L — AB (ref 3.5–5.1)
Sodium: 136 mmol/L (ref 135–145)

## 2019-01-01 LAB — COOXEMETRY PANEL
Carboxyhemoglobin: 1.5 % (ref 0.5–1.5)
Methemoglobin: 1.4 % (ref 0.0–1.5)
O2 Saturation: 60 %
TOTAL HEMOGLOBIN: 13.7 g/dL (ref 12.0–16.0)

## 2019-01-01 LAB — URINE CULTURE: Culture: >=100000 — AB

## 2019-01-01 LAB — ECHOCARDIOGRAM COMPLETE
Height: 73.5 in
Weight: 3140.8 oz

## 2019-01-01 LAB — CBC
HEMATOCRIT: 39.2 % (ref 39.0–52.0)
Hemoglobin: 13.2 g/dL (ref 13.0–17.0)
MCH: 26.8 pg (ref 26.0–34.0)
MCHC: 33.7 g/dL (ref 30.0–36.0)
MCV: 79.7 fL — ABNORMAL LOW (ref 80.0–100.0)
Platelets: 133 10*3/uL — ABNORMAL LOW (ref 150–400)
RBC: 4.92 MIL/uL (ref 4.22–5.81)
RDW: 20.5 % — ABNORMAL HIGH (ref 11.5–15.5)
WBC: 5.7 10*3/uL (ref 4.0–10.5)
nRBC: 0.4 % — ABNORMAL HIGH (ref 0.0–0.2)

## 2019-01-01 LAB — PROTIME-INR
INR: 2.62
Prothrombin Time: 27.6 seconds — ABNORMAL HIGH (ref 11.4–15.2)

## 2019-01-01 MED ORDER — WARFARIN SODIUM 7.5 MG PO TABS
7.5000 mg | ORAL_TABLET | Freq: Once | ORAL | Status: AC
  Administered 2019-01-01: 7.5 mg via ORAL
  Filled 2019-01-01: qty 1

## 2019-01-01 MED ORDER — POTASSIUM CHLORIDE CRYS ER 20 MEQ PO TBCR
40.0000 meq | EXTENDED_RELEASE_TABLET | ORAL | Status: AC
  Administered 2019-01-01 (×3): 40 meq via ORAL
  Filled 2019-01-01 (×3): qty 2

## 2019-01-01 MED ORDER — WARFARIN - PHARMACIST DOSING INPATIENT
Freq: Every day | Status: DC
  Administered 2019-01-01: 17:00:00

## 2019-01-01 MED ORDER — SODIUM CHLORIDE 0.9 % IV SOLN
1.0000 g | INTRAVENOUS | Status: AC
  Administered 2019-01-01 – 2019-01-07 (×7): 1 g via INTRAVENOUS
  Filled 2019-01-01 (×7): qty 10

## 2019-01-01 NOTE — Progress Notes (Signed)
Orthopedic Tech Progress Note Patient Details:  Spencer Rice 07-03-1941 [id number]  Ortho Devices Type of Ortho Device: Haematologist Ortho Device/Splint Interventions: Adjustment, Application, Ordered   Post Interventions Patient Tolerated: Well Instructions Provided: Care of device, Adjustment of device   [name] [name] 01/01/2019, 5:41 PM

## 2019-01-01 NOTE — Progress Notes (Signed)
  Echocardiogram 2D Echocardiogram has been performed.  Spencer Rice 01/01/2019, 4:04 PM

## 2019-01-01 NOTE — Progress Notes (Addendum)
PROGRESS NOTE  Spencer Rice ATF:[id number] DOB: 08-16-1941 DOA: 12/29/2018 PCP: [name]., MD   LOS: 3 days   Brief Narrative / Interim history:  78 year old male with history of systolic CHF with EF less than 20%, history of biventricular fibrillator, hypertension, chronic kidney disease stage III, COPD on chronic oxygen at home 2 L, prior history of kidney cancer status post cryoablation in 2015 who had several ED visits over the last month with progressive shortness of breath, he was also seen as an outpatient in his cardiology office, his diuretics were adjusted but despite that he has had progressive worsening shortness of breath and eventually was admitted to the hospital on 1/27.  Reports feeling extremely short of breath every time he even tries to lay back and he has been sitting upright and sleeping in a chair over the last few days.  He can barely ambulate due to dyspnea.  In the ED he was found to be marked fluid overloaded on chest x-ray, exam, and BNP was about 4500.  He was placed on IV Lasix and was admitted to the hospital.  Subjective: -Reports he is feeling much better today, dyspnea has improved, appetite has improved, no chest pain .  Assessment & Plan: Principal Problem:   Acute on chronic systolic congestive heart failure (HCC) Active Problems:   Essential hypertension   Glucose intolerance (impaired glucose tolerance)   COPD (chronic obstructive pulmonary disease) (HCC)   Biventricular ICD (implantable cardioverter-defibrillator) in place   Apical mural thrombus without MI   CKD (chronic kidney disease) stage 3, GFR 30-59 ml/min (HCC)   PVCs (premature ventricular contractions)   Acute on chronic systolic CHF -Has a history of nonischemic cardiomyopathy based on left heart cath in 2015 -He has a biventricular pacer -Cardiology (heart failure team) was consulted, appreciate input -Treatment per CHF team, currently on Lasix drip, diuresing well, tarted on  milrinone given co-ox of 38%, it did improve up to 60% today, CVP as well down from 26-10 today . -No spironolactone, ARB or dig secondary to CKD   Hypertension -Blood pressure stable, continue furosemide, off Coreg now  Chronic kidney disease stage III -Creatinine around 2, elevated 2.4 on admission, improving, will repeat BMP today  History of apical mural thrombus -Continue Coumadin, pharmacy to dose, INR therapeutic at 2.6 today  UTI -Culture growing Klebsiella pneumonia, started on Rocephin  COPD on chronic home O2 -No wheezing, this appears to be stable, continue 2 L nasal cannula which is his baseline  Left kidney cancer -Status post cryoablation in 2015  Bilateral lower extremity wounds -Currently Ace wrap, patient changes his Unna boots every Wednesday, with staff, to consult [employer] to change today .  Scheduled Meds: . ALPRAZolam  0.25 mg Oral Once  . amiodarone  200 mg Oral Daily  . mouth rinse  15 mL Mouth Rinse BID  . sodium chloride flush  3 mL Intravenous Once  . sodium chloride flush  3 mL Intravenous Q12H  . warfarin  7.5 mg Oral ONCE-1800  . Warfarin - Pharmacist Dosing Inpatient   Does not apply q1800   Continuous Infusions: . sodium chloride    . furosemide (LASIX) infusion 15 mg/hr (12/31/18 1041)  . milrinone 0.25 mcg/kg/min (12/31/18 1059)   PRN Meds:.sodium chloride, acetaminophen, ondansetron (ZOFRAN) IV, sodium chloride flush, sodium chloride flush  DVT prophylaxis: Coumadin Code Status: Full code Family Communication: no family at bedside  Disposition Plan: TBD  Consultants:   Cardiology - heart failure  Procedures:  2D echo: pending  Antimicrobials:  None    Objective: Vitals:   12/31/18 1757 12/31/18 1958 01/01/19 0050 01/01/19 0613  BP: 114/82 111/75 111/70 110/71  Pulse: 74 77 70 70  Resp:    16  Temp:  98.6 F (37 C)  98.8 F (37.1 C)  TempSrc:  Oral  Oral  SpO2: 99% 97% 98% 95%  Weight:    89 kg  Height:         Intake/Output Summary (Last 24 hours) at 01/01/2019 1130 Last data filed at 01/01/2019 1010 Gross per 24 hour  Intake 1346.86 ml  Output 7885 ml  Net -6538.14 ml   Filed Weights   12/30/18 0112 12/31/18 0338 01/01/19 0613  Weight: 92.4 kg 92.3 kg 89 kg    Examination:  Patient sitting in recliner, eating breakfast, in no apparent distress JVD up to ER, Regular rate and rhythm, no rubs gallops, appears to be paced rhythm on telemetry Lungs clear to auscultation, no wheezing or rhonchi Abdomen soft, nontender, nondistended Extremity having bilateral Unna boots, +3 edema bilaterally.   Data Reviewed: I have independently reviewed following labs and imaging studies   CBC: Recent Labs  Lab 12/28/18 1803 12/29/18 1942 12/31/18 0320 01/01/19 1046  WBC 5.7 5.7 4.8 5.7  NEUTROABS  --  4.0  --   --   HGB 13.9 13.6 13.0 13.2  HCT 43.8 43.1 39.9 39.2  MCV 81.3 81.9 79.2* 79.7*  PLT 139* 144* 127* 001*   Basic Metabolic Panel: Recent Labs  Lab 12/28/18 1803 12/29/18 1942 12/31/18 0320  NA 137 137 140  K 3.7 3.9 3.9  CL 98 98 97*  CO2 29 27 28   GLUCOSE 131* 113* 98  BUN 33* 34* 33*  CREATININE 1.99* 1.77* 2.04*  CALCIUM 8.8* 8.9 9.0   GFR: Estimated Creatinine Clearance: 34.8 mL/min (A) (by C-G formula based on SCr of 2.04 mg/dL (H)). Liver Function Tests: Recent Labs  Lab 12/29/18 1942  AST 39  ALT 21  ALKPHOS 111  BILITOT 2.8*  PROT 7.1  ALBUMIN 3.1*   No results for input(s): LIPASE, AMYLASE in the last 168 hours. No results for input(s): AMMONIA in the last 168 hours. Coagulation Profile: Recent Labs  Lab 12/28/18 1829 12/29/18 1942 12/30/18 0919 12/31/18 0914 01/01/19 1046  INR 2.75 3.33 3.69 3.59 2.62   Cardiac Enzymes: Recent Labs  Lab 12/28/18 1821 12/29/18 1942  TROPONINI 0.05* 0.05*   BNP (last 3 results) No results for input(s): PROBNP in the last 8760 hours. HbA1C: No results for input(s): HGBA1C in the last 72 hours. CBG: No  results for input(s): GLUCAP in the last 168 hours. Lipid Profile: No results for input(s): CHOL, HDL, LDLCALC, TRIG, CHOLHDL, LDLDIRECT in the last 72 hours. Thyroid Function Tests: No results for input(s): TSH, T4TOTAL, FREET4, T3FREE, THYROIDAB in the last 72 hours. Anemia Panel: No results for input(s): VITAMINB12, FOLATE, FERRITIN, TIBC, IRON, RETICCTPCT in the last 72 hours. Urine analysis:    Component Value Date/Time   COLORURINE YELLOW 12/29/2018 1943   APPEARANCEUR CLEAR 12/29/2018 1943   LABSPEC 1.015 12/29/2018 1943   PHURINE 5.0 12/29/2018 [place] 12/29/2018 [place] (A) 12/29/2018 [place] 12/29/2018 [place] 12/29/2018 [place] NEGATIVE 12/29/2018 1943   NITRITE NEGATIVE 12/29/2018 1943   LEUKOCYTESUR MODERATE (A) 12/29/2018 1943   Sepsis Labs: Invalid input(s): PROCALCITONIN, LACTICIDVEN  Recent Results (from the past 240 hour(s))  Urine culture     Status: Abnormal   Collection Time: 12/29/18  7:43 PM  Result Value Ref Range Status   Specimen Description   Final    URINE, RANDOM Performed at [facility], [address] [place], [place] [address]    Special Requests   Final    NONE Performed at [facility], [address] [place], [place] [address]    Culture >=100,000 COLONIES/mL KLEBSIELLA PNEUMONIAE (A)  Final   Report Status 01/01/2019 FINAL  Final   Organism ID, Bacteria KLEBSIELLA PNEUMONIAE (A)  Final      Susceptibility   Klebsiella pneumoniae - MIC*    AMPICILLIN >=32 RESISTANT Resistant     CEFAZOLIN <=4 SENSITIVE Sensitive     CEFTRIAXONE <=1 SENSITIVE Sensitive     CIPROFLOXACIN <=0.25 SENSITIVE Sensitive     GENTAMICIN <=1 SENSITIVE Sensitive     IMIPENEM <=0.25 SENSITIVE Sensitive     NITROFURANTOIN 128 RESISTANT Resistant     TRIMETH/SULFA <=20 SENSITIVE Sensitive     AMPICILLIN/SULBACTAM >=32 RESISTANT Resistant     PIP/TAZO <=4  SENSITIVE Sensitive     Extended ESBL NEGATIVE Sensitive     * >=100,000 COLONIES/mL KLEBSIELLA PNEUMONIAE      Radiology Studies: Dg Chest Port 1 View  Result Date: 12/30/2018 CLINICAL DATA:  Malposition PICC pulled back and flushed. EXAM: PORTABLE CHEST 1 VIEW COMPARISON:  Earlier this day at 1904 hour FINDINGS: Right upper extremity PICC has been retracted, tip now appropriately position in the mid SVC, no longer looped. Unchanged cardiomegaly. Multi lead left-sided pacemaker in place. Unchanged right pleural effusion and associated basilar airspace disease. No pneumothorax. No new abnormalities. IMPRESSION: Right upper extremity PICC has been retracted, tip now appropriately position in the mid SVC. Unchanged cardiomegaly and right pleural effusion with associated basilar atelectasis/airspace disease. Electronically Signed   By: Keith Rake M.D.   On: 12/30/2018 20:37   Dg Chest Port 1 View  Result Date: 12/30/2018 CLINICAL DATA:  78 year old male with a history of malpositioned PICC EXAM: PORTABLE CHEST 1 VIEW COMPARISON:  12/30/2018, 12/29/2018 FINDINGS: The cardiomediastinal silhouette is unchanged with cardiomegaly. Similar appearance of cardiac pacing device/AICD. There has been interval repositioning of the right upper extremity PICC. There is now a redundant course of the PICC, with 180 degree loop on the tip. Similar appearance of opacity at the right lung base with partial obscuration of the right hemidiaphragm. IMPRESSION: Interval repositioning of the right upper extremity PICC, which appears now redundant in the SVC. Persisting right pleural effusion and associated atelectasis/consolidation. Cardiomegaly. Unchanged cardiac AICD. Electronically Signed   By: Corrie Mckusick D.O.   On: 12/30/2018 19:24   Dg Chest Port 1 View  Result Date: 12/30/2018 CLINICAL DATA:  Peripherally inserted central catheter. EXAM: PORTABLE CHEST 1 VIEW COMPARISON:  12/29/2018 FINDINGS: 1752 hours.  Cardiopericardial silhouette is markedly enlarged. Vascular congestion again noted with probable interstitial edema. There is right base atelectasis or infiltrate with small right pleural effusion, stable. Permanent pacemaker again noted. Right PICC line tip overlies the proximal SVC level. Catheter could be advanced 7.5 cm for tip positioning in the region of the SVC/RA junction. The visualized bony structures of the thorax are intact. IMPRESSION: Right PICC line tip overlies the proximal SVC level. Catheter could be advanced 7.5 cm for tip positioning in the region of the SVC/RA junction, as warranted. Marked enlargement of the cardiopericardial silhouette with similar appearance of right base atelectasis and effusion. Electronically Signed  By: Misty Stanley M.D.   On: 12/30/2018 18:11   [place] Ekg Site Rite  Result Date: 12/31/2018 If Site Rite image not attached, placement could not be confirmed due to current cardiac rhythm.   Phillips Climes MD [facility]  Contact via  [email or web address]

## 2019-01-01 NOTE — Progress Notes (Signed)
ANTICOAGULATION CONSULT NOTE - Follow-Up Consult  Pharmacy Consult for Coumadin Indication: apical mural thrombus  No Known Allergies  Patient Measurements: Height: 6' 1.5" (186.7 cm) Weight: 196 lb 4.8 oz (89 kg) IBW/kg (Calculated) : 81.05  Vital Signs: Temp: 98.8 F (37.1 C) (01/29 0613) Temp Source: Oral (01/29 0613) BP: 110/71 (01/29 1610) Pulse Rate: 70 (01/29 0613)  Labs: Recent Labs    12/29/18 1942 12/30/18 0919 12/31/18 0320 12/31/18 0914 01/01/19 1046  HGB 13.6  --  13.0  --  13.2  HCT 43.1  --  39.9  --  39.2  PLT 144*  --  127*  --  133*  LABPROT 33.3* 36.0*  --  35.3* 27.6*  INR 3.33 3.69  --  3.59 2.62  CREATININE 1.77*  --  2.04*  --   --   TROPONINI 0.05*  --   --   --   --     Estimated Creatinine Clearance: 34.8 mL/min (A) (by C-G formula based on SCr of 2.04 mg/dL (H)).   Medical History: Past Medical History:  Diagnosis Date  . AICD (automatic cardioverter/defibrillator) present   . Cancer of left kidney ([place])    S/P OR 05/2014  . CHF (congestive heart failure) ([place])   . Coronary artery disease   . DVT (deep venous thrombosis) (HCC) 1983   LLE  . GERD (gastroesophageal reflux disease)   . History of hiatal hernia   . Hypertension     Medications:  Medications Prior to Admission  Medication Sig Dispense Refill Last Dose  . amiodarone (PACERONE) 200 MG tablet Take 200 mg by mouth daily.   12/29/2018 at am  . aspirin 81 MG chewable tablet Chew 81 mg by mouth.   12/29/2018 at am  . carvedilol (COREG) 12.5 MG tablet Take 6.25 mg by mouth 2 (two) times daily. Taking 1/2 tablet (6.25mg ) twice daily   12/29/2018 at am  . esomeprazole (NEXIUM) 20 MG capsule Take 20 mg by mouth daily as needed (FOR HEARTBURN).   unk at prn  . JARDIANCE 10 MG TABS tablet Take 10 mg by mouth daily.  1 12/29/2018 at Unknown time  . mometasone-formoterol (DULERA) 200-5 MCG/ACT AERO Inhale 2 puffs into the lungs 2 (two) times daily.   12/29/2018 at Unknown time  .  Multiple Vitamin (MULTIVITAMIN WITH MINERALS) TABS tablet Take 1 tablet by mouth daily.   12/29/2018 at Unknown time  . omeprazole (PRILOSEC) 20 MG capsule Take 20 mg by mouth daily.   12/29/2018 at Unknown time  . potassium chloride (K-DUR,KLOR-CON) 10 MEQ tablet Take 10 mEq by mouth every other day.   12/29/2018 at Unknown time  . sucralfate (CARAFATE) 1 g tablet Take 1 tablet (1 g total) by mouth 4 (four) times daily -  with meals and at bedtime. 28 tablet 0 12/29/2018 at Unknown time  . tamsulosin (FLOMAX) 0.4 MG CAPS capsule Take 0.4 mg by mouth.   12/29/2018 at Unknown time  . torsemide (DEMADEX) 20 MG tablet Take 40 mg by mouth 2 (two) times daily.   12/29/2018 at Unknown time  . warfarin (COUMADIN) 5 MG tablet Take 2.5 mg by mouth daily. Take 2.5 mg daily except 5 mg Thursday   12/28/2018 at 1800  . benzonatate (TESSALON) 100 MG capsule Take 1 capsule (100 mg total) by mouth every 8 (eight) hours. (Patient not taking: Reported on 12/30/2018) 21 capsule 0 Not Taking at Unknown time  . carvedilol (COREG) 3.125 MG tablet Take 1 tablet (3.125 mg  total) by mouth 2 (two) times daily with a meal. (Patient not taking: Reported on 12/30/2018) 60 tablet 1 Not Taking at Unknown time  . torsemide (DEMADEX) 20 MG tablet Take 1.5 tablet (30mg ) in AM and 1 table (20mg ) in PM daily (Patient not taking: Reported on 12/30/2018) 75 tablet 1 Not Taking at Unknown time   Scheduled:  . ALPRAZolam  0.25 mg Oral Once  . amiodarone  200 mg Oral Daily  . mouth rinse  15 mL Mouth Rinse BID  . sodium chloride flush  3 mL Intravenous Once  . sodium chloride flush  3 mL Intravenous Q12H    Assessment: 75 yoM admitted with volume overload. Pt on warfarin PTA for hx of mural thromus. INR supratherapeutic on admit at 3.69, last dose PTA on 1/25. INR now therapeutic at 2.62 after holding several doses. Will resume warfarin tonight with a boost to prevent subtherapeutic drop.  *Home Dose = 5mg  Thurs, 2.5mg  all other days  Goal  of Therapy:  INR 2-3   Plan:  -Warfarin 7.5mg  PO x1 tonight -Daily INR  Arrie Senate, PharmD, BCPS Clinical Pharmacist [phone] Please check AMION for all [facility] Pharmacy numbers 01/01/2019

## 2019-01-01 NOTE — Progress Notes (Addendum)
Advanced Heart Failure Rounding Note  PCP-Cardiologist: No primary care provider on file.   Subjective:    Yesterday started milrinone 0.25 mcg and lasix drip at 15 mg per hour. Brisk diuresis noted. Negative 5 liters. Weight down 6 pounds. CO-OX improved from 38% to 60%.     Feeling a little better. Denies SOB.   Echo still pending   Objective:   Weight Range: 89 kg Body mass index is 25.55 kg/m.   Vital Signs:   Temp:  [97.9 F (36.6 C)-98.8 F (37.1 C)] 98.8 F (37.1 C) (01/29 0613) Pulse Rate:  [42-77] 70 (01/29 0613) Resp:  [16-18] 16 (01/29 0613) BP: (94-114)/(70-86) 110/71 (01/29 0613) SpO2:  [94 %-100 %] 95 % (01/29 0613) Weight:  [89 kg] 89 kg (01/29 0613) Last BM Date: 12/30/18  Weight change: Filed Weights   12/30/18 0112 12/31/18 0338 01/01/19 0354  Weight: 92.4 kg 92.3 kg 89 kg    Intake/Output:   Intake/Output Summary (Last 24 hours) at 01/01/2019 0808 Last data filed at 01/01/2019 6568 Gross per 24 hour  Intake 1454.67 ml  Output 6485 ml  Net -5030.33 ml      Physical Exam   CVP 11-12  General:  Sitting in the chair. No resp difficulty HEENT: normal Neck: supple. JVP to ear. Carotids 2+ bilat; no bruits. No lymphadenopathy or thryomegaly appreciated. Cor: PMI nondisplaced. Regular rate & rhythm. +s3 2/6 [place]/TR Lungs: clear Abdomen: soft, nontender, nondistended. No hepatosplenomegaly. No bruits or masses. Good bowel sounds. Extremities: no cyanosis, clubbing, rash, R and LLE 3+ unna boots.  Neuro: alert & orientedx3, cranial nerves grossly intact. moves all 4 extremities w/o difficulty. Affect pleasant  Telemetry    A sensed V paced 70s   EKG    N/a   Labs    CBC Recent Labs    12/29/18 1942 12/31/18 0320  WBC 5.7 4.8  NEUTROABS 4.0  --   HGB 13.6 13.0  HCT 43.1 39.9  MCV 81.9 79.2*  PLT 144* 127*   Basic Metabolic Panel Recent Labs    12/29/18 1942 12/31/18 0320  NA 137 140  K 3.9 3.9  CL 98 97*  CO2 27 28    GLUCOSE 113* 98  BUN 34* 33*  CREATININE 1.77* 2.04*  CALCIUM 8.9 9.0   Liver Function Tests Recent Labs    12/29/18 1942  AST 39  ALT 21  ALKPHOS 111  BILITOT 2.8*  PROT 7.1  ALBUMIN 3.1*   No results for input(s): LIPASE, AMYLASE in the last 72 hours. Cardiac Enzymes Recent Labs    12/29/18 1942  TROPONINI 0.05*    BNP: BNP (last 3 results) Recent Labs    12/04/18 1743 12/28/18 1821 12/29/18 1943  BNP >4,500.0* >4,925.0* >4,500.0*    ProBNP (last 3 results) No results for input(s): PROBNP in the last 8760 hours.   D-Dimer No results for input(s): DDIMER in the last 72 hours. Hemoglobin A1C No results for input(s): HGBA1C in the last 72 hours. Fasting Lipid Panel No results for input(s): CHOL, HDL, LDLCALC, TRIG, CHOLHDL, LDLDIRECT in the last 72 hours. Thyroid Function Tests No results for input(s): TSH, T4TOTAL, T3FREE, THYROIDAB in the last 72 hours.  Invalid input(s): FREET3  Other results:   Imaging    [place] Ekg Site Rite  Result Date: 12/31/2018 If Site Rite image not attached, placement could not be confirmed due to current cardiac rhythm.    Medications:     Scheduled Medications: . ALPRAZolam  0.25 mg Oral Once  . amiodarone  200 mg Oral Daily  . mouth rinse  15 mL Mouth Rinse BID  . sodium chloride flush  3 mL Intravenous Once  . sodium chloride flush  3 mL Intravenous Q12H    Infusions: . sodium chloride    . furosemide (LASIX) infusion 15 mg/hr (12/31/18 1041)  . milrinone 0.25 mcg/kg/min (12/31/18 1059)    PRN Medications: sodium chloride, acetaminophen, ondansetron (ZOFRAN) IV, sodium chloride flush, sodium chloride flush    Patient Profile   Spencer Rice is a 78 year old with history of LBBB, PVCs on amio 03/2018, biotronik BIV ICD, NICM, chronic systolic heart failure, HTN, CKD stage 3 (1.8-2.4), COPD, DMII, and left kidney cancer 2015.    Admitted with marked volume overload.    Assessment/Plan  1. A/C  Systolic Heart Failure, NICM  , LHC 2015 nonobstructive disease. Has Biotronik BiV. Repeat ECHO pending.  CO-OX 38% so milrinone started on 1/28.  CO-OX improved on milrinone 0.25 mcg. CO-OX 60%. CVP coming down 11-12.  Continue lasix drip at 15 mg per hour. BMET pending.   - No spiro/arb/ dig with CKD.  - Continue unna boots.   2. CKD Stage III, creatinine baseline 1.8-2.4  BMET pending.   3. LBBB Biotronik to interrogate. QRS has widened over the last few months.   4. COPD on chronic home oxygen -Sats stable on 2 liters oxygen.   5. L Kidney Cancer  -Had cryoablation in 2015 -Watch renal function closely.   6. PVCs  Started on amiodarone 2019 to suppress PVCs.  -Continue amiodarone 200 mg daily.  -Check TSH -LFTs ok.   7. H/O Mural Thrombus -On coumadin. Pharmacy to dose coumadin.   8. Suspected Mitral Regurgitation.  -ECHO pending.   Continue lasix drip + milrinone.    Length of Stay: 3  Amy Clegg, NP  01/01/2019, 8:08 AM  Advanced Heart Failure Team Pager [phone] (M-F; 7a - 4p)  Please contact [facility] for night-coverage after hours (4p -7a ) and weekends on [email or web address]  Patient seen and examined with Darrick Grinder, NP. We discussed all aspects of the encounter. I agree with the assessment and plan as stated above.   He has end-stage systolic HF. NYHA IV now admitted with cardiogenic shock. Co-ox and CVP much improved with milrinone and IV lasix. Await echo to assess RV. Will need to discuss with VAD team to see if he is candidate for advanced therapies. Age, CKD and lung disease may be limiting factors. Once fully diuresed will need RHC.   Glori Bickers, MD  12:55 PM

## 2019-01-02 LAB — BASIC METABOLIC PANEL
ANION GAP: 11 (ref 5–15)
BUN: 36 mg/dL — ABNORMAL HIGH (ref 8–23)
CO2: 33 mmol/L — ABNORMAL HIGH (ref 22–32)
Calcium: 8.8 mg/dL — ABNORMAL LOW (ref 8.9–10.3)
Chloride: 93 mmol/L — ABNORMAL LOW (ref 98–111)
Creatinine, Ser: 1.95 mg/dL — ABNORMAL HIGH (ref 0.61–1.24)
GFR calc Af Amer: 37 mL/min — ABNORMAL LOW (ref >60)
GFR calc non Af Amer: 32 mL/min — ABNORMAL LOW (ref >60)
Glucose, Bld: 136 mg/dL — ABNORMAL HIGH (ref 70–99)
POTASSIUM: 3.9 mmol/L (ref 3.5–5.1)
Sodium: 137 mmol/L (ref 135–145)

## 2019-01-02 LAB — COOXEMETRY PANEL
Carboxyhemoglobin: 1.9 % — ABNORMAL HIGH (ref 0.5–1.5)
Methemoglobin: 0.8 % (ref 0.0–1.5)
O2 Saturation: 63.8 %
Total hemoglobin: 14 g/dL (ref 12.0–16.0)

## 2019-01-02 LAB — PROTIME-INR
INR: 2.53
Prothrombin Time: 26.9 seconds — ABNORMAL HIGH (ref 11.4–15.2)

## 2019-01-02 LAB — MAGNESIUM: Magnesium: 2.2 mg/dL (ref 1.7–2.4)

## 2019-01-02 MED ORDER — POTASSIUM CHLORIDE CRYS ER 20 MEQ PO TBCR
40.0000 meq | EXTENDED_RELEASE_TABLET | ORAL | Status: AC
  Administered 2019-01-02 (×2): 40 meq via ORAL
  Filled 2019-01-02 (×2): qty 2

## 2019-01-02 MED ORDER — WARFARIN SODIUM 2.5 MG PO TABS
2.5000 mg | ORAL_TABLET | Freq: Once | ORAL | Status: AC
  Administered 2019-01-02: 2.5 mg via ORAL
  Filled 2019-01-02: qty 1

## 2019-01-02 NOTE — Progress Notes (Signed)
ANTICOAGULATION CONSULT NOTE - Follow-Up Consult  Pharmacy Consult for Coumadin Indication: apical mural thrombus  No Known Allergies  Patient Measurements: Height: 6' 1.5" (186.7 cm) Weight: 188 lb 14.4 oz (85.7 kg) IBW/kg (Calculated) : 81.05  Vital Signs: Temp: 98.9 F (37.2 C) (01/30 0828) BP: 113/70 (01/30 0818) Pulse Rate: 70 (01/30 0818)  Labs: Recent Labs    12/31/18 0320 12/31/18 0914 01/01/19 1046 01/02/19 0559  HGB 13.0  --  13.2  --   HCT 39.9  --  39.2  --   PLT 127*  --  133*  --   LABPROT  --  35.3* 27.6* 26.9*  INR  --  3.59 2.62 2.53  CREATININE 2.04*  --  2.05* 1.95*    Estimated Creatinine Clearance: 36.4 mL/min (A) (by C-G formula based on SCr of 1.95 mg/dL (H)).   Medical History: Past Medical History:  Diagnosis Date  . AICD (automatic cardioverter/defibrillator) present   . Cancer of left kidney ([place])    S/P OR 05/2014  . CHF (congestive heart failure) ([place])   . Coronary artery disease   . DVT (deep venous thrombosis) ([facility]) 1983   LLE  . GERD (gastroesophageal reflux disease)   . History of hiatal hernia   . Hypertension     Medications:  Medications Prior to Admission  Medication Sig Dispense Refill Last Dose  . amiodarone (PACERONE) 200 MG tablet Take 200 mg by mouth daily.   12/29/2018 at am  . aspirin 81 MG chewable tablet Chew 81 mg by mouth.   12/29/2018 at am  . carvedilol (COREG) 12.5 MG tablet Take 6.25 mg by mouth 2 (two) times daily. Taking 1/2 tablet (6.25mg ) twice daily   12/29/2018 at am  . esomeprazole (NEXIUM) 20 MG capsule Take 20 mg by mouth daily as needed (FOR HEARTBURN).   unk at prn  . JARDIANCE 10 MG TABS tablet Take 10 mg by mouth daily.  1 12/29/2018 at Unknown time  . mometasone-formoterol (DULERA) 200-5 MCG/ACT AERO Inhale 2 puffs into the lungs 2 (two) times daily.   12/29/2018 at Unknown time  . Multiple Vitamin (MULTIVITAMIN WITH MINERALS) TABS tablet Take 1 tablet by mouth daily.   12/29/2018 at Unknown time   . omeprazole (PRILOSEC) 20 MG capsule Take 20 mg by mouth daily.   12/29/2018 at Unknown time  . potassium chloride (K-DUR,KLOR-CON) 10 MEQ tablet Take 10 mEq by mouth every other day.   12/29/2018 at Unknown time  . sucralfate (CARAFATE) 1 g tablet Take 1 tablet (1 g total) by mouth 4 (four) times daily -  with meals and at bedtime. 28 tablet 0 12/29/2018 at Unknown time  . tamsulosin (FLOMAX) 0.4 MG CAPS capsule Take 0.4 mg by mouth.   12/29/2018 at Unknown time  . torsemide (DEMADEX) 20 MG tablet Take 40 mg by mouth 2 (two) times daily.   12/29/2018 at Unknown time  . warfarin (COUMADIN) 5 MG tablet Take 2.5 mg by mouth daily. Take 2.5 mg daily except 5 mg Thursday   12/28/2018 at 1800  . benzonatate (TESSALON) 100 MG capsule Take 1 capsule (100 mg total) by mouth every 8 (eight) hours. (Patient not taking: Reported on 12/30/2018) 21 capsule 0 Not Taking at Unknown time  . carvedilol (COREG) 3.125 MG tablet Take 1 tablet (3.125 mg total) by mouth 2 (two) times daily with a meal. (Patient not taking: Reported on 12/30/2018) 60 tablet 1 Not Taking at Unknown time  . torsemide (DEMADEX) 20 MG tablet Take  1.5 tablet (30mg ) in AM and 1 table (20mg ) in PM daily (Patient not taking: Reported on 12/30/2018) 75 tablet 1 Not Taking at Unknown time   Scheduled:  . ALPRAZolam  0.25 mg Oral Once  . amiodarone  200 mg Oral Daily  . mouth rinse  15 mL Mouth Rinse BID  . sodium chloride flush  3 mL Intravenous Once  . sodium chloride flush  3 mL Intravenous Q12H  . Warfarin - Pharmacist Dosing Inpatient   Does not apply q1800    Assessment: 87 yoM admitted with volume overload. Pt on warfarin PTA for hx of mural thromus. INR supratherapeutic on admit at 3.69, with doses held since last PTA dose on 1/25. Yesterday INR therapeutic so boosted dose given, today INR is 2.53, appetite improving.  *Home Dose = 5mg  Thurs, 2.5mg  all other days  Goal of Therapy:  INR 2-3   Plan:  -Warfarin 2.5mg  PO x1 tonight -Daily  INR  Arrie Senate, PharmD, BCPS Clinical Pharmacist [phone] Please check AMION for all [facility] Pharmacy numbers 01/02/2019

## 2019-01-02 NOTE — Progress Notes (Addendum)
Advanced Heart Failure Rounding Note  PCP-Cardiologist: No primary care provider on file.   Subjective:    Remains on milrinone 0.25 mcg + lasix drip 12 mg per hour. Negative 6.4 liters. Weight down another 8 pounds.     Feeling much better. Appetite improving. Denies SOB.    ECHO  EF 10% with severe MR, severe TR,  Biatrial enlargement,  RV  moderately reduced. unction.    Objective:   Weight Range: 85.7 kg Body mass index is 24.58 kg/m.   Vital Signs:   Temp:  [97.7 F (36.5 C)-98.3 F (36.8 C)] 97.7 F (36.5 C) (01/30 0402) Pulse Rate:  [72-79] 77 (01/30 0402) BP: (113-116)/(68-83) 114/83 (01/30 0402) SpO2:  [99 %-100 %] 100 % (01/30 0402) Weight:  [85.7 kg] 85.7 kg (01/30 0359) Last BM Date: 01/01/19  Weight change: Filed Weights   12/31/18 0338 01/01/19 0613 01/02/19 0359  Weight: 92.3 kg 89 kg 85.7 kg    Intake/Output:   Intake/Output Summary (Last 24 hours) at 01/02/2019 0744 Last data filed at 01/02/2019 0700 Gross per 24 hour  Intake 1113.07 ml  Output 7600 ml  Net -6486.93 ml      Physical Exam   CVP 11-12 personally checked.  General:  Well appearing. No resp difficulty HEENT: normal Neck: supple. JVP to jaw.  Carotids 2+ bilat; no bruits. No lymphadenopathy or thryomegaly appreciated. Cor: PMI nondisplaced. Regular rate & rhythm. No rubs. +S3  2/6 MR/TR . Lungs: clear Abdomen: soft, nontender, nondistended. No hepatosplenomegaly. No bruits or masses. Good bowel sounds. Extremities: no cyanosis, clubbing, rash, R and L thigh 2+ edema with unna boots in place.  Neuro: alert & orientedx3, cranial nerves grossly intact. moves all 4 extremities w/o difficulty. Affect pleasant   Telemetry  S sensed V paced 70s   EKG    N/a   Labs    CBC Recent Labs    12/31/18 0320 01/01/19 1046  WBC 4.8 5.7  HGB 13.0 13.2  HCT 39.9 39.2  MCV 79.2* 79.7*  PLT 127* 935*   Basic Metabolic Panel Recent Labs    12/31/18 0320 01/01/19 1046  NA  140 136  K 3.9 3.2*  CL 97* 90*  CO2 28 31  GLUCOSE 98 133*  BUN 33* 37*  CREATININE 2.04* 2.05*  CALCIUM 9.0 9.2   Liver Function Tests No results for input(s): AST, ALT, ALKPHOS, BILITOT, PROT, ALBUMIN in the last 72 hours. No results for input(s): LIPASE, AMYLASE in the last 72 hours. Cardiac Enzymes No results for input(s): CKTOTAL, CKMB, CKMBINDEX, TROPONINI in the last 72 hours.  BNP: BNP (last 3 results) Recent Labs    12/04/18 1743 12/28/18 1821 12/29/18 1943  BNP >4,500.0* >4,925.0* >4,500.0*    ProBNP (last 3 results) No results for input(s): PROBNP in the last 8760 hours.   D-Dimer No results for input(s): DDIMER in the last 72 hours. Hemoglobin A1C No results for input(s): HGBA1C in the last 72 hours. Fasting Lipid Panel No results for input(s): CHOL, HDL, LDLCALC, TRIG, CHOLHDL, LDLDIRECT in the last 72 hours. Thyroid Function Tests No results for input(s): TSH, T4TOTAL, T3FREE, THYROIDAB in the last 72 hours.  Invalid input(s): FREET3  Other results:   Imaging    No results found.   Medications:     Scheduled Medications: . ALPRAZolam  0.25 mg Oral Once  . amiodarone  200 mg Oral Daily  . mouth rinse  15 mL Mouth Rinse BID  . sodium chloride flush  3 mL Intravenous Once  . sodium chloride flush  3 mL Intravenous Q12H  . Warfarin - Pharmacist Dosing Inpatient   Does not apply q1800    Infusions: . sodium chloride    . cefTRIAXone (ROCEPHIN)  IV 1 g (01/01/19 1609)  . furosemide (LASIX) infusion 15 mg/hr (01/02/19 0700)  . 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    Admitted with marked  volume overload.    Assessment/Plan  1. A/C Systolic Heart Failure, NICM  , LHC 2015 nonobstructive disease. Has Biotronik BiV.   CO-OX 38% so milrinone started on 1/28. ECHO 01/01/19 EF 10% with Severe MR Biatrial enlargement RV with moderately function.  CO-OX improved on milrinone 0.25 mcg.  Todays CO-OX is pending.   CVP 11-12. Brisk diuresis noted. Continue lasix 15 mg per hour.  - No spiro/arb/ dig with CKD.  - Continue unna boots.  - Briefly discussed advanced therapies. Not a candidate for heart transplant due to age, possible LVAD, also discussed possible inotropes at home.   2. CKD Stage III, creatinine baseline 1.8-2.4  Creatinine stable at 1.95.  BMET in am.   3. LBBB Biotronik to interrogate. QRS has widened over the last few months.   4. COPD on chronic home oxygen -Sats stable on 2 liters oxygen.   5. L Kidney Cancer  -Had cryoablation in 2015 -Watch renal function closely.   6. PVCs  Started on amiodarone 2019 to suppress PVCs.  -Continue amiodarone 200 mg daily.  Check TSH in am.  -LFTs ok.   7. H/O Mural Thrombus -On coumadin. Pharmacy to dose coumadin.  INR 2.5   8. Mitral Regurgitation/Tricuspid Regurgitation Severe MR on ECHO 01/01/19  9. UTI On rocephin per primary team.       Ambulate today.   Length of Stay: 4  Amy Clegg, NP  01/02/2019, 7:44 AM  Advanced Heart Failure Team Pager [phone] (M-F; 7a - 4p)  Please contact [facility] for night-coverage after hours (4p -7a ) and weekends on [email or web address]  Patient seen and examined with Darrick Grinder, NP. We discussed all aspects of the encounter. I agree with the assessment and plan as stated above.   He remains on IV milrinone and lasix gtt. Diuresing briskly and co-ox improved. Creatinine now down to 1.95. Echo with EF 10-15% severe MR/TR and moderate RV dysfunction. CVP still 11-12. Will continue milrinone and IV diuresis at least one more day. Long talk about possible VAD therapy and  he would like to hear more and discuss with his family. Will have VAD coordinators see. Advanced age, CKD and RV failure are hurdles but not absolute contraindications at this point. Will need RHC prior to d/c.   Glori Bickers, MD  4:08 PM

## 2019-01-02 NOTE — Progress Notes (Signed)
PROGRESS NOTE  Holland Nickson WNI:[id number] DOB: [birth year] DOA: 12/29/2018 PCP: [name]., MD   LOS: 4 days   Brief Narrative / Interim history:  78 year old male with history of systolic CHF with EF less than 20%, history of biventricular fibrillator, hypertension, chronic kidney disease stage III, COPD on chronic oxygen at home 2 L, prior history of kidney cancer status post cryoablation in 2015 who had several ED visits over the last month with progressive shortness of breath, he was also seen as an outpatient in his cardiology office, his diuretics were adjusted but despite that he has had progressive worsening shortness of breath and eventually was admitted to the hospital on 1/27.  Reports feeling extremely short of breath every time he even tries to lay back and he has been sitting upright and sleeping in a chair over the last few days.  He can barely ambulate due to dyspnea.  In the ED he was found to be marked fluid overloaded on chest x-ray, exam, and BNP was about 4500.  He was placed on IV Lasix and was admitted to the hospital.  Subjective: -Ports his dyspnea has improved, he ambulated to the restroom with minimal dyspnea, denies cough, reports appetite is good, denies any chest pain .  Assessment & Plan: Principal Problem:   Acute on chronic systolic congestive heart failure ([facility]CC) Active Problems:   Essential hypertension   Glucose intolerance (impaired glucose tolerance)   COPD (chronic obstructive pulmonary disease) (HCC)   Biventricular ICD (implantable cardioverter-defibrillator) in place   Apical mural thrombus without MI   CKD (chronic kidney disease) stage 3, GFR 30-59 ml/min ([facility])   PVCs (premature ventricular contractions)   Acute on chronic systolic CHF -Has a history of nonischemic cardiomyopathy based on left heart cath in 2015 -P 2D echo showing EF at 10%, with severe MR, severe TR, with biatrial enlargement, with RV moderately reduced, management per  CHF team, remains on Lasix drip, milrinone drip, he is feeling much better, he is -6.4 L already, and his overall weight is down by 8 pounds. -He has a biventricular pacer  Hypertension -Blood pressure stable, he is on Lasix drip, off beta-blockers  Chronic kidney disease stage III -Creatinine around 2, appears to be stable despite diuresis, continue to monitor closely  History of apical mural thrombus -Continue Coumadin, pharmacy to dose,   UTI -Culture growing Klebsiella pneumonia, continue with Rocephin  COPD on chronic home O2 -No wheezing, this appears to be stable, continue 2 L nasal cannula which is his baseline  Left kidney cancer -Status post cryoablation in 2015  Bilateral lower extremity wounds -Currently Ace wrap, patient changes his Unna boots every Wednesday, with staff, to consult [employer] to change today .  Scheduled Meds: . ALPRAZolam  0.25 mg Oral Once  . amiodarone  200 mg Oral Daily  . mouth rinse  15 mL Mouth Rinse BID  . potassium chloride  40 mEq Oral Q4H  . sodium chloride flush  3 mL Intravenous Once  . sodium chloride flush  3 mL Intravenous Q12H  . warfarin  2.5 mg Oral ONCE-1800  . Warfarin - Pharmacist Dosing Inpatient   Does not apply q1800   Continuous Infusions: . sodium chloride    . cefTRIAXone (ROCEPHIN)  IV 1 g (01/01/19 1609)  . furosemide (LASIX) infusion 15 mg/hr (01/02/19 0700)  . milrinone 0.25 mcg/kg/min (01/02/19 1142)   PRN Meds:.sodium chloride, acetaminophen, ondansetron (ZOFRAN) IV, sodium chloride flush, sodium chloride flush  DVT prophylaxis: Coumadin Code  Status: Full code Family Communication: no family at bedside  Disposition Plan: TBD  Consultants:   Cardiology - heart failure  Procedures:   2D echo: pending  Antimicrobials:  None    Objective: Vitals:   01/02/19 0359 01/02/19 0402 01/02/19 0818 01/02/19 0828  BP:  114/83 113/70   Pulse:  77 70   Resp:      Temp:  97.7 F (36.5 C)  98.9 F (37.2 C)   TempSrc:      SpO2:  100% 95%   Weight: 85.7 kg     Height:        Intake/Output Summary (Last 24 hours) at 01/02/2019 1156 Last data filed at 01/02/2019 1046 Gross per 24 hour  Intake 1098.88 ml  Output 7800 ml  Net -6701.12 ml   Filed Weights   12/31/18 0338 01/01/19 0613 01/02/19 0359  Weight: 92.3 kg 89 kg 85.7 kg    Examination:  Awake Alert, Oriented X 3, No new F.N deficits, Normal affect, sitting in recliner, appears comfortable Symmetrical Chest wall movement, Good air movement bilaterally, CTAB RRR,No Gallops,Rubs or new Murmurs, No Parasternal Heave +ve B.Sounds, Abd Soft, No tenderness, No rebound - guarding or rigidity. No Cyanosis, Clubbing, 2 edema, improved, Unna boots in place   data Reviewed: I have independently reviewed following labs and imaging studies   CBC: Recent Labs  Lab 12/28/18 1803 12/29/18 1942 12/31/18 0320 01/01/19 1046  WBC 5.7 5.7 4.8 5.7  NEUTROABS  --  4.0  --   --   HGB 13.9 13.6 13.0 13.2  HCT 43.8 43.1 39.9 39.2  MCV 81.3 81.9 79.2* 79.7*  PLT 139* 144* 127* 324*   Basic Metabolic Panel: Recent Labs  Lab 12/28/18 1803 12/29/18 1942 12/31/18 0320 01/01/19 1046 01/02/19 0559  NA 137 137 140 136 137  K 3.7 3.9 3.9 3.2* 3.9  CL 98 98 97* 90* 93*  CO2 29 27 28 31  33*  GLUCOSE 131* 113* 98 133* 136*  BUN 33* 34* 33* 37* 36*  CREATININE 1.99* 1.77* 2.04* 2.05* 1.95*  CALCIUM 8.8* 8.9 9.0 9.2 8.8*  MG  --   --   --   --  2.2   GFR: Estimated Creatinine Clearance: 36.4 mL/min (A) (by C-G formula based on SCr of 1.95 mg/dL (H)). Liver Function Tests: Recent Labs  Lab 12/29/18 1942  AST 39  ALT 21  ALKPHOS 111  BILITOT 2.8*  PROT 7.1  ALBUMIN 3.1*   No results for input(s): LIPASE, AMYLASE in the last 168 hours. No results for input(s): AMMONIA in the last 168 hours. Coagulation Profile: Recent Labs  Lab 12/29/18 1942 12/30/18 0919 12/31/18 0914 01/01/19 1046 01/02/19 0559  INR 3.33 3.69 3.59 2.62 2.53    Cardiac Enzymes: Recent Labs  Lab 12/28/18 1821 12/29/18 1942  TROPONINI 0.05* 0.05*   BNP (last 3 results) No results for input(s): PROBNP in the last 8760 hours. HbA1C: No results for input(s): HGBA1C in the last 72 hours. CBG: No results for input(s): GLUCAP in the last 168 hours. Lipid Profile: No results for input(s): CHOL, HDL, LDLCALC, TRIG, CHOLHDL, LDLDIRECT in the last 72 hours. Thyroid Function Tests: No results for input(s): TSH, T4TOTAL, FREET4, T3FREE, THYROIDAB in the last 72 hours. Anemia Panel: No results for input(s): VITAMINB12, FOLATE, FERRITIN, TIBC, IRON, RETICCTPCT in the last 72 hours. Urine analysis:    Component Value Date/Time   COLORURINE YELLOW 12/29/2018 1943   APPEARANCEUR CLEAR 12/29/2018 1943   LABSPEC 1.015 12/29/2018 1943  PHURINE 5.0 12/29/2018 1943   GLUCOSEU NEGATIVE 12/29/2018 [place] (A) 12/29/2018 [place] 12/29/2018 [place] 12/29/2018 [place] NEGATIVE 12/29/2018 1943   NITRITE NEGATIVE 12/29/2018 [place] (A) 12/29/2018 1943   Sepsis Labs: Invalid input(s): PROCALCITONIN, LACTICIDVEN  Recent Results (from the past 240 hour(s))  Urine culture     Status: Abnormal   Collection Time: 12/29/18  7:43 PM  Result Value Ref Range Status   Specimen Description   Final    URINE, RANDOM Performed at [facility], [address] [place], [place] [address]    Special Requests   Final    NONE Performed at [facility], [address] [place], [place] [address]    Culture >=100,000 COLONIES/mL KLEBSIELLA PNEUMONIAE (A)  Final   Report Status 01/01/2019 FINAL  Final   Organism ID, Bacteria KLEBSIELLA PNEUMONIAE (A)  Final      Susceptibility   Klebsiella pneumoniae - MIC*    AMPICILLIN >=32 RESISTANT Resistant     CEFAZOLIN <=4 SENSITIVE Sensitive     CEFTRIAXONE <=1 SENSITIVE Sensitive     CIPROFLOXACIN <=0.25 SENSITIVE  Sensitive     GENTAMICIN <=1 SENSITIVE Sensitive     IMIPENEM <=0.25 SENSITIVE Sensitive     NITROFURANTOIN 128 RESISTANT Resistant     TRIMETH/SULFA <=20 SENSITIVE Sensitive     AMPICILLIN/SULBACTAM >=32 RESISTANT Resistant     PIP/TAZO <=4 SENSITIVE Sensitive     Extended ESBL NEGATIVE Sensitive     * >=100,000 COLONIES/mL KLEBSIELLA PNEUMONIAE      Radiology Studies: No results found.  Phillips Climes MD [facility]  Contact via  [email or web address]

## 2019-01-03 LAB — BASIC METABOLIC PANEL
Anion gap: 14 (ref 5–15)
BUN: 37 mg/dL — ABNORMAL HIGH (ref 8–23)
CO2: 31 mmol/L (ref 22–32)
Calcium: 9.1 mg/dL (ref 8.9–10.3)
Chloride: 89 mmol/L — ABNORMAL LOW (ref 98–111)
Creatinine, Ser: 1.66 mg/dL — ABNORMAL HIGH (ref 0.61–1.24)
GFR calc non Af Amer: 39 mL/min — ABNORMAL LOW (ref >60)
GFR, EST AFRICAN AMERICAN: 45 mL/min — AB (ref >60)
Glucose, Bld: 145 mg/dL — ABNORMAL HIGH (ref 70–99)
POTASSIUM: 4.1 mmol/L (ref 3.5–5.1)
Sodium: 134 mmol/L — ABNORMAL LOW (ref 135–145)

## 2019-01-03 LAB — PROTIME-INR
INR: 2.75
Prothrombin Time: 28.7 seconds — ABNORMAL HIGH (ref 11.4–15.2)

## 2019-01-03 LAB — COOXEMETRY PANEL
Carboxyhemoglobin: 1.5 % (ref 0.5–1.5)
Methemoglobin: 0.9 % (ref 0.0–1.5)
O2 Saturation: 47.5 %
Total hemoglobin: 14.8 g/dL (ref 12.0–16.0)

## 2019-01-03 MED ORDER — FLORANEX PO PACK
1.0000 g | PACK | Freq: Three times a day (TID) | ORAL | Status: DC
  Administered 2019-01-03 – 2019-01-29 (×55): 1 g via ORAL
  Filled 2019-01-03 (×83): qty 1

## 2019-01-03 MED ORDER — TORSEMIDE 20 MG PO TABS
40.0000 mg | ORAL_TABLET | Freq: Two times a day (BID) | ORAL | Status: DC
  Administered 2019-01-03 – 2019-01-05 (×6): 40 mg via ORAL
  Filled 2019-01-03 (×6): qty 2

## 2019-01-03 MED ORDER — WARFARIN SODIUM 2.5 MG PO TABS
2.5000 mg | ORAL_TABLET | Freq: Once | ORAL | Status: AC
  Administered 2019-01-03: 2.5 mg via ORAL
  Filled 2019-01-03: qty 1

## 2019-01-03 NOTE — Progress Notes (Signed)
PROGRESS NOTE  Spencer Rice NAT:[id number] DOB: [birth year] DOA: 12/29/2018 PCP: [name]., MD   LOS: 5 days   Brief Narrative / Interim history:  78 year old male with history of systolic CHF with EF less than 20%, history of biventricular fibrillator, hypertension, chronic kidney disease stage III, COPD on chronic oxygen at home 2 L, prior history of kidney cancer status post cryoablation in 2015 who had several ED visits over the last month with progressive shortness of breath, he was also seen as an outpatient in his cardiology office, his diuretics were adjusted but despite that he has had progressive worsening shortness of breath and eventually was admitted to the hospital on 1/27.  Reports feeling extremely short of breath every time he even tries to lay back and he has been sitting upright and sleeping in a chair over the last few days.  He can barely ambulate due to dyspnea.  In the ED he was found to be marked fluid overloaded on chest x-ray, exam, and BNP was about 4500.  He was placed on IV Lasix and was admitted to the hospital.  Subjective: -Patient denies any complaints today, report dyspnea has improved, no chest pain, no fever or chills .  Assessment & Plan: Principal Problem:   Acute on chronic systolic congestive heart failure (HCC) Active Problems:   Essential hypertension   Glucose intolerance (impaired glucose tolerance)   COPD (chronic obstructive pulmonary disease) (HCC)   Biventricular ICD (implantable cardioverter-defibrillator) in place   Apical mural thrombus without MI   CKD (chronic kidney disease) stage 3, GFR 30-59 ml/min (HCC)   PVCs (premature ventricular contractions)   Acute on chronic systolic CHF -Has a history of nonischemic cardiomyopathy based on left heart cath in 2015 -P 2D echo showing EF at 10%, with severe MR, severe TR, with biatrial enlargement, with RV moderately reduced. -Management per CHF team, he diuresed very well on Lasix  drip, -3.2 L, 3 pounds weight loss since admission 205 > 181.9 , Lasix drip has been stopped, currently on p.o. torsemide . -He has biventricular pacer  -being Evaluated for LVAD -No Aldactone/ARB/digoxin with CKD  Hypertension -Blood pressure stable, he is off beta-blockers,  Chronic kidney disease stage III -Creatinine around 2, appears to be stable despite diuresis, continue to monitor closely, creatinine is 1.6 today  History of apical mural thrombus -Continue Coumadin, pharmacy to dose,   UTI -Culture growing Klebsiella pneumonia, continue with Rocephin  COPD on chronic home O2 -No wheezing, this appears to be stable, continue 2 L nasal cannula which is his baseline  Left kidney cancer -Status post cryoablation in 2015  Bilateral lower extremity wounds -Currently Ace wrap, patient changes his Unna boots every Wednesday, with staff, to consult [employer] to change today .  Scheduled Meds: . ALPRAZolam  0.25 mg Oral Once  . amiodarone  200 mg Oral Daily  . mouth rinse  15 mL Mouth Rinse BID  . sodium chloride flush  3 mL Intravenous Once  . sodium chloride flush  3 mL Intravenous Q12H  . torsemide  40 mg Oral BID  . warfarin  2.5 mg Oral ONCE-1800  . Warfarin - Pharmacist Dosing Inpatient   Does not apply q1800   Continuous Infusions: . sodium chloride    . cefTRIAXone (ROCEPHIN)  IV 1 g (01/02/19 1602)  . milrinone 0.25 mcg/kg/min (01/03/19 0617)   PRN Meds:.sodium chloride, acetaminophen, ondansetron (ZOFRAN) IV, sodium chloride flush, sodium chloride flush  DVT prophylaxis: Coumadin Code Status: Full code Family  Communication: no family at bedside  Disposition Plan: TBD  Consultants:   Cardiology - heart failure  Procedures:   2D echo: pending  Antimicrobials:  None    Objective: Vitals:   01/02/19 2000 01/02/19 2326 01/03/19 0338 01/03/19 0911  BP: 107/75 115/83 107/72 107/64  Pulse: 74 70 82 85  Resp:  20    Temp: 98.6 F (37 C) 98 F (36.7 C)  98.3 F (36.8 C)   TempSrc: Oral Oral Oral   SpO2: 100% 98% 99% 97%  Weight:   82.5 kg   Height:        Intake/Output Summary (Last 24 hours) at 01/03/2019 1230 Last data filed at 01/03/2019 1124 Gross per 24 hour  Intake 2023.04 ml  Output 4866 ml  Net -2842.96 ml   Filed Weights   01/01/19 0613 01/02/19 0359 01/03/19 0338  Weight: 89 kg 85.7 kg 82.5 kg    Examination:  Awake Alert, Oriented X 3, No new F.N deficits, Normal affect Symmetrical Chest wall movement, Good air movement bilaterally, CTAB RRR,No Gallops,Rubs or new Murmurs, No Parasternal Heave +ve B.Sounds, Abd Soft, No tenderness, No rebound - guarding or rigidity. No Cyanosis, Clubbing , Unna boots bilaterally, swelling has improved   data Reviewed: I have independently reviewed following labs and imaging studies   CBC: Recent Labs  Lab 12/28/18 1803 12/29/18 1942 12/31/18 0320 01/01/19 1046  WBC 5.7 5.7 4.8 5.7  NEUTROABS  --  4.0  --   --   HGB 13.9 13.6 13.0 13.2  HCT 43.8 43.1 39.9 39.2  MCV 81.3 81.9 79.2* 79.7*  PLT 139* 144* 127* 956*   Basic Metabolic Panel: Recent Labs  Lab 12/29/18 1942 12/31/18 0320 01/01/19 1046 01/02/19 0559 01/03/19 0515  NA 137 140 136 137 134*  K 3.9 3.9 3.2* 3.9 4.1  CL 98 97* 90* 93* 89*  CO2 27 28 31  33* 31  GLUCOSE 113* 98 133* 136* 145*  BUN 34* 33* 37* 36* 37*  CREATININE 1.77* 2.04* 2.05* 1.95* 1.66*  CALCIUM 8.9 9.0 9.2 8.8* 9.1  MG  --   --   --  2.2  --    GFR: Estimated Creatinine Clearance: 42.7 mL/min (A) (by C-G formula based on SCr of 1.66 mg/dL (H)). Liver Function Tests: Recent Labs  Lab 12/29/18 1942  AST 39  ALT 21  ALKPHOS 111  BILITOT 2.8*  PROT 7.1  ALBUMIN 3.1*   No results for input(s): LIPASE, AMYLASE in the last 168 hours. No results for input(s): AMMONIA in the last 168 hours. Coagulation Profile: Recent Labs  Lab 12/30/18 0919 12/31/18 0914 01/01/19 1046 01/02/19 0559 01/03/19 0515  INR 3.69 3.59 2.62 2.53  2.75   Cardiac Enzymes: Recent Labs  Lab 12/28/18 1821 12/29/18 1942  TROPONINI 0.05* 0.05*   BNP (last 3 results) No results for input(s): PROBNP in the last 8760 hours. HbA1C: No results for input(s): HGBA1C in the last 72 hours. CBG: No results for input(s): GLUCAP in the last 168 hours. Lipid Profile: No results for input(s): CHOL, HDL, LDLCALC, TRIG, CHOLHDL, LDLDIRECT in the last 72 hours. Thyroid Function Tests: No results for input(s): TSH, T4TOTAL, FREET4, T3FREE, THYROIDAB in the last 72 hours. Anemia Panel: No results for input(s): VITAMINB12, FOLATE, FERRITIN, TIBC, IRON, RETICCTPCT in the last 72 hours. Urine analysis:    Component Value Date/Time   COLORURINE YELLOW 12/29/2018 1943   APPEARANCEUR CLEAR 12/29/2018 1943   LABSPEC 1.015 12/29/2018 1943   PHURINE 5.0 12/29/2018 1943  GLUCOSEU NEGATIVE 12/29/2018 [place] (A) 12/29/2018 [place] 12/29/2018 [place] 12/29/2018 [place] NEGATIVE 12/29/2018 1943   NITRITE NEGATIVE 12/29/2018 [place] (A) 12/29/2018 1943   Sepsis Labs: Invalid input(s): PROCALCITONIN, LACTICIDVEN  Recent Results (from the past 240 hour(s))  Urine culture     Status: Abnormal   Collection Time: 12/29/18  7:43 PM  Result Value Ref Range Status   Specimen Description   Final    URINE, RANDOM Performed at [facility], [address] [place],  [address]    Special Requests   Final    NONE Performed at [facility], [address] [place], [place] [address]    Culture >=100,000 COLONIES/mL KLEBSIELLA PNEUMONIAE (A)  Final   Report Status 01/01/2019 FINAL  Final   Organism ID, Bacteria KLEBSIELLA PNEUMONIAE (A)  Final      Susceptibility   Klebsiella pneumoniae - MIC*    AMPICILLIN >=32 RESISTANT Resistant     CEFAZOLIN <=4 SENSITIVE Sensitive     CEFTRIAXONE <=1 SENSITIVE Sensitive     CIPROFLOXACIN <=0.25 SENSITIVE  Sensitive     GENTAMICIN <=1 SENSITIVE Sensitive     IMIPENEM <=0.25 SENSITIVE Sensitive     NITROFURANTOIN 128 RESISTANT Resistant     TRIMETH/SULFA <=20 SENSITIVE Sensitive     AMPICILLIN/SULBACTAM >=32 RESISTANT Resistant     PIP/TAZO <=4 SENSITIVE Sensitive     Extended ESBL NEGATIVE Sensitive     * >=100,000 COLONIES/mL KLEBSIELLA PNEUMONIAE      Radiology Studies: No results found.  Phillips Climes MD [facility]  Contact via  [email or web address]

## 2019-01-03 NOTE — Progress Notes (Signed)
ANTICOAGULATION CONSULT NOTE - Follow-Up Consult  Pharmacy Consult for Coumadin Indication: apical mural thrombus  No Known Allergies  Patient Measurements: Height: 6' 1.5" (186.7 cm) Weight: 181 lb 14.4 oz (82.5 kg) IBW/kg (Calculated) : 81.05  Vital Signs: Temp: 98.3 F (36.8 C) (01/31 0338) Temp Source: Oral (01/31 0338) BP: 107/72 (01/31 0338) Pulse Rate: 82 (01/31 0338)  Labs: Recent Labs    01/01/19 1046 01/02/19 0559 01/03/19 0515  HGB 13.2  --   --   HCT 39.2  --   --   PLT 133*  --   --   LABPROT 27.6* 26.9* 28.7*  INR 2.62 2.53 2.75  CREATININE 2.05* 1.95* 1.66*    Estimated Creatinine Clearance: 42.7 mL/min (A) (by C-G formula based on SCr of 1.66 mg/dL (H)).   Medical History: Past Medical History:  Diagnosis Date  . AICD (automatic cardioverter/defibrillator) present   . Cancer of left kidney ([place])    S/P OR 05/2014  . CHF (congestive heart failure) ([place])   . Coronary artery disease   . DVT (deep venous thrombosis) (HCC) 1983   LLE  . GERD (gastroesophageal reflux disease)   . History of hiatal hernia   . Hypertension     Medications:  Medications Prior to Admission  Medication Sig Dispense Refill Last Dose  . amiodarone (PACERONE) 200 MG tablet Take 200 mg by mouth daily.   12/29/2018 at am  . aspirin 81 MG chewable tablet Chew 81 mg by mouth.   12/29/2018 at am  . carvedilol (COREG) 12.5 MG tablet Take 6.25 mg by mouth 2 (two) times daily. Taking 1/2 tablet (6.25mg ) twice daily   12/29/2018 at am  . esomeprazole (NEXIUM) 20 MG capsule Take 20 mg by mouth daily as needed (FOR HEARTBURN).   unk at prn  . JARDIANCE 10 MG TABS tablet Take 10 mg by mouth daily.  1 12/29/2018 at Unknown time  . mometasone-formoterol (DULERA) 200-5 MCG/ACT AERO Inhale 2 puffs into the lungs 2 (two) times daily.   12/29/2018 at Unknown time  . Multiple Vitamin (MULTIVITAMIN WITH MINERALS) TABS tablet Take 1 tablet by mouth daily.   12/29/2018 at Unknown time  . omeprazole  (PRILOSEC) 20 MG capsule Take 20 mg by mouth daily.   12/29/2018 at Unknown time  . potassium chloride (K-DUR,KLOR-CON) 10 MEQ tablet Take 10 mEq by mouth every other day.   12/29/2018 at Unknown time  . sucralfate (CARAFATE) 1 g tablet Take 1 tablet (1 g total) by mouth 4 (four) times daily -  with meals and at bedtime. 28 tablet 0 12/29/2018 at Unknown time  . tamsulosin (FLOMAX) 0.4 MG CAPS capsule Take 0.4 mg by mouth.   12/29/2018 at Unknown time  . torsemide (DEMADEX) 20 MG tablet Take 40 mg by mouth 2 (two) times daily.   12/29/2018 at Unknown time  . warfarin (COUMADIN) 5 MG tablet Take 2.5 mg by mouth daily. Take 2.5 mg daily except 5 mg Thursday   12/28/2018 at 1800  . benzonatate (TESSALON) 100 MG capsule Take 1 capsule (100 mg total) by mouth every 8 (eight) hours. (Patient not taking: Reported on 12/30/2018) 21 capsule 0 Not Taking at Unknown time  . carvedilol (COREG) 3.125 MG tablet Take 1 tablet (3.125 mg total) by mouth 2 (two) times daily with a meal. (Patient not taking: Reported on 12/30/2018) 60 tablet 1 Not Taking at Unknown time  . torsemide (DEMADEX) 20 MG tablet Take 1.5 tablet (30mg ) in AM and 1 table (20mg )  in PM daily (Patient not taking: Reported on 12/30/2018) 75 tablet 1 Not Taking at Unknown time   Scheduled:  . ALPRAZolam  0.25 mg Oral Once  . amiodarone  200 mg Oral Daily  . mouth rinse  15 mL Mouth Rinse BID  . sodium chloride flush  3 mL Intravenous Once  . sodium chloride flush  3 mL Intravenous Q12H  . torsemide  40 mg Oral BID  . Warfarin - Pharmacist Dosing Inpatient   Does not apply q1800    Assessment: 23 yoM admitted with volume overload. Pt on warfarin PTA for hx of mural thromus. INR supratherapeutic on admit at 3.69, now at goal range at 2.75 after holding for several days.  *Home Dose = 5mg  Thurs, 2.5mg  all other days  Goal of Therapy:  INR 2-3   Plan:  -Warfarin 2.5mg  PO x1 tonight -Daily protime  Arrie Senate, PharmD, BCPS Clinical  Pharmacist [phone] Please check AMION for all [facility] numbers 01/03/2019

## 2019-01-03 NOTE — Progress Notes (Signed)
MCS First Encounter Note:  I had lengthy discussion with patient about the risks, benefits, indications and alternatives to Heartmate III VAD placement. I reviewed the following: the need for 24/7 care when pt is discharged home due to sternal precautions, caregiver role, life style changes, follow up care, VAD dressing supplies, driveline management care, and long term anticoagulation. Provided brief equipment overview and demonstration with HeartMate III training loop. Provided with educational resources to read more about his options: "Understanding your options with heart failure" pamphlet, VAD Program educational booklet, "Exploring options with LVAD" pamphlet, and "Living a more active life" pamphlet.   The patient understands that from this discussion it does not mean that they will receive the device, but that depends on an extensive evaluation process. Mr. Tijerina declined to sign consent today, but would like to talk again on Monday after he has had the weekend to think about his options.    Emerson Monte RN [place] Coordinator  Office: [phone]  24/7 Pager: [phone]

## 2019-01-03 NOTE — Care Management Important Message (Signed)
Important Message  Patient Details  Name: Spencer Rice MRN: [id number] Date of Birth: Nov 17, 1941   Medicare Important Message Given:  Yes    Mahima Hottle P Christiona Siddique 01/03/2019, 3:03 PM

## 2019-01-03 NOTE — Progress Notes (Addendum)
Advanced Heart Failure Rounding Note  PCP-Cardiologist: No primary care provider on file.   Subjective:    Remains on milrinone 0.25. Co-ox 67% Off lasix due to low CVP. Creatinine down 1.5.   Feeling much better. Denies CP, SOB, orthopnea or PND.   We discussed LVAD at length yesterday and he was not sure he wanted to proceed. Today his 2 sons are in the room and are interested in proceeding with VAD if he is a candidate.   Remains on warfarin. INR 2.96. K 3.3 No bleeding.     ECHO 01/01/2019  EF 10% with severe Spencer, severe TR,  Biatrial enlargement,  RV  moderately reduced. unction.    Objective:   Weight Range: 82.5 kg Body mass index is 23.67 kg/m.   Vital Signs:   Temp:  [98 F (36.7 C)-98.9 F (37.2 C)] 98.3 F (36.8 C) (01/31 0338) Pulse Rate:  [70-82] 82 (01/31 0338) Resp:  [17-20] 20 (01/30 2326) BP: (107-115)/(70-83) 107/72 (01/31 0338) SpO2:  [95 %-100 %] 99 % (01/31 0338) Weight:  [82.5 kg] 82.5 kg (01/31 0338) Last BM Date: 01/01/19  Weight change: Filed Weights   01/01/19 0613 01/02/19 0359 01/03/19 0338  Weight: 89 kg 85.7 kg 82.5 kg    Intake/Output:   Intake/Output Summary (Last 24 hours) at 01/03/2019 0722 Last data filed at 01/03/2019 0500 Gross per 24 hour  Intake 2259.04 ml  Output 5491 ml  Net -3231.96 ml      Physical Exam   General:  Sitting in bed No resp difficulty HEENT: normal Neck: supple. JVP 7-8  Carotids 2+ bilat; no bruits. No lymphadenopathy or thryomegaly appreciated. Cor: PMI laterally displaced. Regular rate & rhythm. 2/6 Spencer/TR Lungs: clear Abdomen: soft, nontender, nondistended. No hepatosplenomegaly. No bruits or masses. Good bowel sounds. Extremities: no cyanosis, clubbing, rash, + UNNA boots trace -1+ edema  RUE PICC Neuro: alert & orientedx3, cranial nerves grossly intact. moves all 4 extremities w/o difficulty. Affect pleasant   Telemetry   AV paced 80. Personally reviewed  EKG    N/a   Labs      CBC Recent Labs    01/01/19 1046  WBC 5.7  HGB 13.2  HCT 39.2  MCV 79.7*  PLT 379*   Basic Metabolic Panel Recent Labs    01/02/19 0559 01/03/19 0515  NA 137 134*  K 3.9 4.1  CL 93* 89*  CO2 33* 31  GLUCOSE 136* 145*  BUN 36* 37*  CREATININE 1.95* 1.66*  CALCIUM 8.8* 9.1  MG 2.2  --    Liver Function Tests No results for input(s): AST, ALT, ALKPHOS, BILITOT, PROT, ALBUMIN in the last 72 hours. No results for input(s): LIPASE, AMYLASE in the last 72 hours. Cardiac Enzymes No results for input(s): CKTOTAL, CKMB, CKMBINDEX, TROPONINI in the last 72 hours.  BNP: BNP (last 3 results) Recent Labs    12/04/18 1743 12/28/18 1821 12/29/18 1943  BNP >4,500.0* >4,925.0* >4,500.0*    ProBNP (last 3 results) No results for input(s): PROBNP in the last 8760 hours.   D-Dimer No results for input(s): DDIMER in the last 72 hours. Hemoglobin A1C No results for input(s): HGBA1C in the last 72 hours. Fasting Lipid Panel No results for input(s): CHOL, HDL, LDLCALC, TRIG, CHOLHDL, LDLDIRECT in the last 72 hours. Thyroid Function Tests No results for input(s): TSH, T4TOTAL, T3FREE, THYROIDAB in the last 72 hours.  Invalid input(s): FREET3  Other results:   Imaging    No results found.  Medications:     Scheduled Medications: . ALPRAZolam  0.25 mg Oral Once  . amiodarone  200 mg Oral Daily  . mouth rinse  15 mL Mouth Rinse BID  . sodium chloride flush  3 mL Intravenous Once  . sodium chloride flush  3 mL Intravenous Q12H  . Warfarin - Pharmacist Dosing Inpatient   Does not apply q1800    Infusions: . sodium chloride    . cefTRIAXone (ROCEPHIN)  IV 1 g (01/02/19 1602)  . furosemide (LASIX) infusion 15 mg/hr (01/03/19 0614)  . 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    Admitted with marked volume overload.    Assessment/Plan   1. A/C Systolic Heart Failure -> cardiogenic shock - due to NICM  , LHC 2015 nonobstructive disease. Has Biotronik BiV.   - Initial CO-OX 38% so milrinone started on 1/28. - ECHO 01/01/19 EF 10% with Severe Spencer/TR Biatrial enlargement RV with moderately reduced function.  - Remains on milrinone 0.25. Co-ox improved to 66% today - Weight down 18 pounds with diuresis. Renal function improving with diuresis.  - CVP 7-8 Continue to hold lasix Will likely start po diuretics in am - He remains on milrinone. Volume status and renal function much improved. He is clearly inotrope dependent. He met with VAD team on 1/30 and we had long talk about his situation and need for advanced therapies. He initially was unsure if he would want a man-made device to help the heart that God gave him. I had a long talk with him and his sons today and they want to proceed with VAD w/u. We discussed the work-up process in depth, including the need to assess RV function more closely and he is set up for R/L cath on Monday but is on warfarin with INR 2.96. Will stop warfarin. Will need to delay cath as ideally would want to do R/L cath - D/w Dr. Van Trigt  2. CKD Stage III, creatinine baseline 1.8-2.4  - Creatinine trending down to 1.52 today with inotrope dependence  3. LBBB -stable s/p BIvICD  4. COPD on chronic home oxygen -Sats stable on 2 liters oxygen.   5. L Kidney Cancer  -Had cryoablation in 2015 -Renal function improving with inotropes  6. PVCs  Started on amiodarone 2019 to suppress PVCs.  -Continue amiodarone 200 mg daily.    7. H/O Mural Thrombus -On coumadin. Holding for cath. Discussed dosing with PharmD personally.  8. 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 UTI On rocephin per primary team.       Length of Stay: 5  Daniel Bensimhon, MD  2:13 PM   Advanced Heart Failure Team Pager [phone] (M-F; 7a - 4p)  Please contact [facility] for night-coverage after hours (4p -7a ) and weekends on [email or web address]    

## 2019-01-03 NOTE — Evaluation (Signed)
Physical Therapy Evaluation Patient Details Name: Spencer Rice MRN: [id number] DOB: 10/15/1941 Today's Date: 01/03/2019   History of Present Illness  78yo male with ongoing SOB, chest x-ray showing pulmonary edema and R pleural effusion. Admitted for CHF exacerbation. PMH kidney cancer, CHF, hx DVT, AICD/PPM plancement, COPD, apical thrombus on Coumadin, hx cardiac cath   Clinical Impression   Patient received up in chair asleep, easily woken and willing to participate in therapy. Able to perform sit to stand with S and no device and able to perform standing marches without device and just S from therapist. Heart rhythm then converted to non-sustained V-tach, RN confirmed rhythm and patient asymptomatic, per RN ok to continue as long as patient remains asymptomatic.  Able to then ambulate approximately 123f with S and no assistive device but easily fatigued with gait speed decreasing significantly at end of gait distance. HR WNL with activity, unable to get accurate reading from SpO2 monitor but patient not SOB.  He was left up in his chair with all needs met this morning, RN aware of patient status.     Follow Up Recommendations Home health PT    Equipment Recommendations  Rolling walker with 5" wheels    Recommendations for Other Services       Precautions / Restrictions Precautions Precautions: Fall Restrictions Weight Bearing Restrictions: No      Mobility  Bed Mobility               General bed mobility comments: OOB in chair   Transfers Overall transfer level: Needs assistance Equipment used: None Transfers: Sit to/from Stand Sit to Stand: Supervision         General transfer comment: S for safety, no other physical assist given   Ambulation/Gait Ambulation/Gait assistance: Supervision Gait Distance (Feet): 100 Feet Assistive device: None Gait Pattern/deviations: WFL(Within Functional Limits);Step-through pattern Gait velocity: decreased    General  Gait Details: gait pattern generally WNL, only requiring S for safety due to mild unsteadiness; easily fatigued . Unable to get accurate SpO2 per finger pulse ox, HR remained WNL wtih gait.   Stairs            Wheelchair Mobility    Modified Rankin (Stroke Patients Only)       Balance Overall balance assessment: Mild deficits observed, not formally tested                                           Pertinent Vitals/Pain Pain Assessment: No/denies pain    Home Living Family/patient expects to be discharged to:: Private residence Living Arrangements: Spouse/significant other Available Help at Discharge: Family Type of Home: House Home Access: Level entry     Home Layout: One level Home Equipment: WEnvironmental consultant- 2 wheels;Cane - single point;Other (comment)(home O2 )      Prior Function Level of Independence: Independent               Hand Dominance        Extremity/Trunk Assessment   Upper Extremity Assessment Upper Extremity Assessment: Defer to OT evaluation    Lower Extremity Assessment Lower Extremity Assessment: Generalized weakness    Cervical / Trunk Assessment Cervical / Trunk Assessment: Normal  Communication   Communication: No difficulties  Cognition Arousal/Alertness: Awake/alert Behavior During Therapy: WFL for tasks assessed/performed Overall Cognitive Status: Within Functional Limits for tasks assessed  General Comments      Exercises     Assessment/Plan    PT Assessment Patient needs continued PT services  PT Problem List Decreased strength;Decreased coordination;Cardiopulmonary status limiting activity;Decreased activity tolerance;Decreased knowledge of use of DME;Decreased balance;Decreased safety awareness;Decreased mobility;Decreased knowledge of precautions       PT Treatment Interventions DME instruction;Therapeutic exercise;Gait training;Balance  training;Neuromuscular re-education;Functional mobility training;Patient/family education;Therapeutic activities    PT Goals (Current goals can be found in the Care Plan section)  Acute Rehab PT Goals Patient Stated Goal: go home PT Goal Formulation: With patient Time For Goal Achievement: 01/17/19 Potential to Achieve Goals: Good    Frequency Min 3X/week   Barriers to discharge        Co-evaluation               AM-PAC PT "6 Clicks" Mobility  Outcome Measure Help needed turning from your back to your side while in a flat bed without using bedrails?: A Little Help needed moving from lying on your back to sitting on the side of a flat bed without using bedrails?: A Little Help needed moving to and from a bed to a chair (including a wheelchair)?: A Little Help needed standing up from a chair using your arms (e.g., wheelchair or bedside chair)?: A Little Help needed to walk in hospital room?: A Little Help needed climbing 3-5 steps with a railing? : A Little 6 Click Score: 18    End of Session   Activity Tolerance: Patient tolerated treatment well Patient left: in chair;with call bell/phone within reach   PT Visit Diagnosis: Muscle weakness (generalized) (M62.81);Other abnormalities of gait and mobility (R26.89)    Time: 6384-5364 PT Time Calculation (min) (ACUTE ONLY): 23 min   Charges:   PT Evaluation $PT Eval Moderate Complexity: 1 Mod PT Treatments $Gait Training: 8-22 mins        Deniece Ree PT, DPT, CBIS  Supplemental Physical Therapist [name]    Pager [phone] Acute Rehab Office [phone]

## 2019-01-03 NOTE — Progress Notes (Signed)
   He has been set up for [place] on Monday at 1200 by Dr Haroldine Laws.    Jonice Cerra NP-C  2:34 PM

## 2019-01-03 NOTE — Progress Notes (Signed)
Pt complain of lower abdominal pain and difficulty urinating. Bladder scan showed 720ml retained. Intermittent Cath performed. Removed 925 ml. Will continue to monitor.

## 2019-01-04 LAB — BASIC METABOLIC PANEL
Anion gap: 14 (ref 5–15)
BUN: 32 mg/dL — AB (ref 8–23)
CO2: 31 mmol/L (ref 22–32)
Calcium: 8.6 mg/dL — ABNORMAL LOW (ref 8.9–10.3)
Chloride: 89 mmol/L — ABNORMAL LOW (ref 98–111)
Creatinine, Ser: 1.52 mg/dL — ABNORMAL HIGH (ref 0.61–1.24)
GFR calc Af Amer: 50 mL/min — ABNORMAL LOW (ref >60)
GFR calc non Af Amer: 44 mL/min — ABNORMAL LOW (ref >60)
GLUCOSE: 281 mg/dL — AB (ref 70–99)
Potassium: 3.3 mmol/L — ABNORMAL LOW (ref 3.5–5.1)
Sodium: 134 mmol/L — ABNORMAL LOW (ref 135–145)

## 2019-01-04 LAB — HEPATIC FUNCTION PANEL
ALT: 20 U/L (ref 0–44)
AST: 36 U/L (ref 15–41)
Albumin: 2.7 g/dL — ABNORMAL LOW (ref 3.5–5.0)
Alkaline Phosphatase: 107 U/L (ref 38–126)
Bilirubin, Direct: 1.5 mg/dL — ABNORMAL HIGH (ref 0.0–0.2)
Indirect Bilirubin: 1.6 mg/dL — ABNORMAL HIGH (ref 0.3–0.9)
Total Bilirubin: 3.1 mg/dL — ABNORMAL HIGH (ref 0.3–1.2)
Total Protein: 6.8 g/dL (ref 6.5–8.1)

## 2019-01-04 LAB — PROTIME-INR
INR: 2.96
Prothrombin Time: 30.4 seconds — ABNORMAL HIGH (ref 11.4–15.2)

## 2019-01-04 LAB — COOXEMETRY PANEL
CARBOXYHEMOGLOBIN: 1.8 % — AB (ref 0.5–1.5)
Methemoglobin: 0.8 % (ref 0.0–1.5)
O2 Saturation: 66.7 %
Total hemoglobin: 14.1 g/dL (ref 12.0–16.0)

## 2019-01-04 LAB — AMMONIA: Ammonia: 41 umol/L — ABNORMAL HIGH (ref 9–35)

## 2019-01-04 MED ORDER — POTASSIUM CHLORIDE CRYS ER 20 MEQ PO TBCR
40.0000 meq | EXTENDED_RELEASE_TABLET | Freq: Once | ORAL | Status: AC
  Administered 2019-01-04: 40 meq via ORAL
  Filled 2019-01-04: qty 2

## 2019-01-04 MED ORDER — PANTOPRAZOLE SODIUM 40 MG PO TBEC
40.0000 mg | DELAYED_RELEASE_TABLET | Freq: Every day | ORAL | Status: DC
  Administered 2019-01-04 – 2019-01-29 (×26): 40 mg via ORAL
  Filled 2019-01-04 (×26): qty 1

## 2019-01-04 MED ORDER — WARFARIN SODIUM 2 MG PO TABS
2.0000 mg | ORAL_TABLET | Freq: Once | ORAL | Status: AC
  Administered 2019-01-04: 2 mg via ORAL
  Filled 2019-01-04: qty 1

## 2019-01-04 MED ORDER — TAMSULOSIN HCL 0.4 MG PO CAPS
0.8000 mg | ORAL_CAPSULE | Freq: Every day | ORAL | Status: DC
  Administered 2019-01-04 – 2019-01-28 (×25): 0.8 mg via ORAL
  Filled 2019-01-04 (×25): qty 2

## 2019-01-04 MED ORDER — MOMETASONE FURO-FORMOTEROL FUM 200-5 MCG/ACT IN AERO
2.0000 | INHALATION_SPRAY | Freq: Two times a day (BID) | RESPIRATORY_TRACT | Status: DC
  Administered 2019-01-05 – 2019-01-29 (×46): 2 via RESPIRATORY_TRACT
  Filled 2019-01-04: qty 8.8

## 2019-01-04 NOTE — Progress Notes (Signed)
Bladder scan done. PVR = 807. Refuses I and O cath at this time, as he says he is going to lunch first. Voided 125 ml at this time.

## 2019-01-04 NOTE — Progress Notes (Signed)
Advanced Heart Failure Rounding Note  PCP-Cardiologist: No primary care provider on file.   Subjective:    Remains on milrinone 0.25 mcg + lasix drip 15 mg per hour. Negative 3.2  liters. Weight on admit 205>181.9 pounds.   Feeling much better. Denies SOB, orthopnea or PND.    ECHO 01/01/2019  EF 10% with severe Spencer, severe TR,  Biatrial enlargement,  RV  moderately reduced. unction.    Objective:   Weight Range: 82.5 kg Body mass index is 23.67 kg/m.   Vital Signs:                 Temp:  [98 F (36.7 C)-98.9 F (37.2 C)] 98.3 F (36.8 C) (01/31 0338) Pulse Rate:  [70-82] 82 (01/31 0338) Resp:  [17-20] 20 (01/30 2326) BP: (107-115)/(70-83) 107/72 (01/31 0338) SpO2:  [95 %-100 %] 99 % (01/31 0338) Weight:  [82.5 kg] 82.5 kg (01/31 0338) Last BM Date: 01/01/19  Weight change:      Filed Weights   01/01/19 0613 01/02/19 0359 01/03/19 0338  Weight: 89 kg 85.7 kg 82.5 kg    Intake/Output:             Intake/Output Summary (Last 24 hours) at 01/03/2019 0722 Last data filed at 01/03/2019 0500    Gross per 24 hour  Intake 2259.04 ml  Output 5491 ml  Net -3231.96 ml                 Physical Exam  CVP 7 personally checked  General: Sitting in the chair.  No resp difficulty HEENT: normal Neck: supple. JVP 8-9 . Carotids 2+ bilat; no bruits. No lymphadenopathy or thryomegaly appreciated. Cor: PMI nondisplaced. Regular rate & rhythm. No rubs. +S3 2/6 Spencer/TR. Lungs: clear on 1 liters oxygen.  Abdomen: soft, nontender, nondistended. No hepatosplenomegaly. No bruits or masses. Good bowel sounds. Extremities: no cyanosis, clubbing, rash,  R and LLE unnal boots.  Neuro: alert & orientedx3, cranial nerves grossly intact. moves all 4 extremities w/o difficulty. Affect pleasant   Telemetry  A -V paced 70-80s  EKG    N/a   Labs    CBC RecentLabs(last2labs)     Recent Labs    01/01/19 1046  WBC 5.7  HGB 13.2  HCT 39.2  MCV 79.7*  PLT  133*     Basic Metabolic Panel SELTRVUYEB([id number])      Recent Labs    01/02/19 0559 01/03/19 0515  NA 137 134*  K 3.9 4.1  CL 93* 89*  CO2 33* 31  GLUCOSE 136* 145*  BUN 36* 37*  CREATININE 1.95* 1.66*  CALCIUM 8.8* 9.1  MG 2.2  --      Liver Function Tests RecentLabs(last2labs)  No results for input(s): AST, ALT, ALKPHOS, BILITOT, PROT, ALBUMIN in the last 72 hours.   RecentLabs(last2labs)  No results for input(s): LIPASE, AMYLASE in the last 72 hours.   Cardiac Enzymes RecentLabs(last2labs)  No results for input(s): CKTOTAL, CKMB, CKMBINDEX, TROPONINI in the last 72 hours.    BNP: BNP (last 3 results) RecentLabs(withinlast365days)       Recent Labs    12/04/18 1743 12/28/18 1821 12/29/18 1943  BNP >4,500.0* >4,925.0* >4,500.0*      ProBNP (last 3 results) RecentLabs(withinlast365days)  No results for input(s): PROBNP in the last 8760 hours.     D-Dimer RecentLabs(last2labs)  No results for input(s): DDIMER in the last 72 hours.   Hemoglobin A1C RecentLabs(last2labs)  No results for input(s): HGBA1C in the last 72  hours.   Fasting Lipid Panel RecentLabs(last2labs)  No results for input(s): CHOL, HDL, LDLCALC, TRIG, CHOLHDL, LDLDIRECT in the last 72 hours.   Thyroid Function Tests  RecentLabs(last2labs)  No results for input(s): TSH, T4TOTAL, T3FREE, THYROIDAB in the last 72 hours.  Invalid input(s): FREET3    Other results:   Imaging     No results found.   Medications:     Scheduled Medications:  .  ALPRAZolam   0.25 mg  Oral  Once   .  amiodarone   200 mg  Oral  Daily   .  mouth rinse   15 mL  Mouth Rinse  BID   .  sodium chloride flush   3 mL  Intravenous  Once   .  sodium chloride flush   3 mL  Intravenous  Q12H   .  Warfarin - Pharmacist Dosing Inpatient     Does not apply  q1800     Infusions:  .  sodium chloride       .  cefTRIAXone (ROCEPHIN)  IV  1 g (01/02/19 1602)   .  furosemide (LASIX) infusion  15 mg/hr (01/03/19 4944)   .  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  Admitted with marked volume overload.   Assessment/Plan  1. A/C Systolic Heart Failure, NICM , LHC 2015 nonobstructive disease. Has Biotronik BiV.   CO-OX 38% so milrinone started on 1/28. ECHO 01/01/19 EF 10% with Severe Spencer Biatrial enlargement RV with moderately reduced function.  CO-OX 47.5 % on  milrinone 0.25 mcg.   CVP 7. Stop lasix drip with RV failure. Start torsemide 40 mg twice a day.   - No spiro/arb/ dig with CKD.  - Continue unna boots.  - Briefly discussed advanced therapies. Not a candidate for heart transplant due to age, possible LVAD, also discussed possible inotropes at home.  -VAD coordinators will come by today.   2. CKD Stage III, creatinine baseline 1.8-2.4  Creatinine trending down to 1.66  3. LBBB Biotronik to interrogate. QRS has widened over the last few months.   4. COPD on chronic home oxygen -Sats stable on 2 liters oxygen.   5. L Kidney Cancer  -Had cryoablation in 2015 -Watch renal function closely.   6. PVCs Started on amiodarone 2019 to suppress PVCs.  -Continue amiodarone 200 mg daily.  Check TSH in am.  -LFTs ok.   7. H/O Mural Thrombus -On coumadin. Pharmacy to dose coumadin.  INR 2.7   8. Mitral Regurgitation/Tricuspid Regurgitation Severe Spencer on ECHO 01/01/19  9. UTI On rocephin per primary team.      Consult PT. Needs to ambulate. Plan for [facility] on Monday to assess hemodynamics.   Length  of Stay: 5  Amy Clegg, NP  01/03/2019, 7:22 AM  Advanced Heart Failure Team Pager [phone] (M-F; 7a - 4p)  Please contact [facility] for night-coverage after hours (4p -7a ) and weekends on [email or web address]  Patient seen and examined with the above-signed Advanced Practice Provider and/or Housestaff. I personally reviewed laboratory data, imaging studies and relevant notes. I independently examined the patient and formulated the important aspects of the plan. I have edited the note to reflect any of  my changes or salient points. I have personally discussed the plan with the patient and/or family.  He remains on milrinone. Volume status and renal function much improved but co-ox now dropping again. He is clearly inotrope dependent. He met with VAD team and we had long talk about his situation and need for advanced therapies. We discussed LVAD in detail and he says that he is not sure if he would want a manmade device to help the heart that God gave him. He wants to think about it some more. If he does want to proceed, we will need to assess RV function more closely and he is set up for R/L cath on Monday. Givng dropping co-ox on milrinone, if he is not candisate for (or refuses) VAD then I think we need to strongly consider Hospice.   Glori Bickers, MD  8:05 PM

## 2019-01-04 NOTE — Progress Notes (Signed)
ANTICOAGULATION CONSULT NOTE - Follow-Up Consult  Pharmacy Consult for Coumadin Indication: apical mural thrombus  No Known Allergies  Patient Measurements: Height: 6' 1.5" (186.7 cm) Weight: 177 lb 12.8 oz (80.6 kg) IBW/kg (Calculated) : 81.05  Vital Signs: Temp: 98.3 F (36.8 C) (02/01 0756) Temp Source: Oral (02/01 0756) BP: 113/71 (02/01 0756) Pulse Rate: 94 (02/01 1100)  Labs: Recent Labs    01/02/19 0559 01/03/19 0515 01/04/19 0546  LABPROT 26.9* 28.7* 30.4*  INR 2.53 2.75 2.96  CREATININE 1.95* 1.66* 1.52*    Estimated Creatinine Clearance: 46.5 mL/min (A) (by C-G formula based on SCr of 1.52 mg/dL (H)).   Medical History: Past Medical History:  Diagnosis Date  . AICD (automatic cardioverter/defibrillator) present   . Cancer of left kidney ([place])    S/P OR 05/2014  . CHF (congestive heart failure) ([place])   . Coronary artery disease   . DVT (deep venous thrombosis) ([facility]) 1983   LLE  . GERD (gastroesophageal reflux disease)   . History of hiatal hernia   . Hypertension     Medications:  Medications Prior to Admission  Medication Sig Dispense Refill Last Dose  . amiodarone (PACERONE) 200 MG tablet Take 200 mg by mouth daily.   12/29/2018 at am  . aspirin 81 MG chewable tablet Chew 81 mg by mouth.   12/29/2018 at am  . carvedilol (COREG) 12.5 MG tablet Take 6.25 mg by mouth 2 (two) times daily. Taking 1/2 tablet (6.25mg ) twice daily   12/29/2018 at am  . esomeprazole (NEXIUM) 20 MG capsule Take 20 mg by mouth daily as needed (FOR HEARTBURN).   unk at prn  . JARDIANCE 10 MG TABS tablet Take 10 mg by mouth daily.  1 12/29/2018 at Unknown time  . mometasone-formoterol (DULERA) 200-5 MCG/ACT AERO Inhale 2 puffs into the lungs 2 (two) times daily.   12/29/2018 at Unknown time  . Multiple Vitamin (MULTIVITAMIN WITH MINERALS) TABS tablet Take 1 tablet by mouth daily.   12/29/2018 at Unknown time  . omeprazole (PRILOSEC) 20 MG capsule Take 20 mg by mouth daily.   12/29/2018  at Unknown time  . potassium chloride (K-DUR,KLOR-CON) 10 MEQ tablet Take 10 mEq by mouth every other day.   12/29/2018 at Unknown time  . sucralfate (CARAFATE) 1 g tablet Take 1 tablet (1 g total) by mouth 4 (four) times daily -  with meals and at bedtime. 28 tablet 0 12/29/2018 at Unknown time  . tamsulosin (FLOMAX) 0.4 MG CAPS capsule Take 0.4 mg by mouth.   12/29/2018 at Unknown time  . torsemide (DEMADEX) 20 MG tablet Take 40 mg by mouth 2 (two) times daily.   12/29/2018 at Unknown time  . warfarin (COUMADIN) 5 MG tablet Take 2.5 mg by mouth daily. Take 2.5 mg daily except 5 mg Thursday   12/28/2018 at 1800  . benzonatate (TESSALON) 100 MG capsule Take 1 capsule (100 mg total) by mouth every 8 (eight) hours. (Patient not taking: Reported on 12/30/2018) 21 capsule 0 Not Taking at Unknown time  . carvedilol (COREG) 3.125 MG tablet Take 1 tablet (3.125 mg total) by mouth 2 (two) times daily with a meal. (Patient not taking: Reported on 12/30/2018) 60 tablet 1 Not Taking at Unknown time  . torsemide (DEMADEX) 20 MG tablet Take 1.5 tablet (30mg ) in AM and 1 table (20mg ) in PM daily (Patient not taking: Reported on 12/30/2018) 75 tablet 1 Not Taking at Unknown time   Scheduled:  . ALPRAZolam  0.25 mg Oral  Once  . amiodarone  200 mg Oral Daily  . lactobacillus  1 g Oral TID WC  . mouth rinse  15 mL Mouth Rinse BID  . mometasone-formoterol  2 puff Inhalation BID  . pantoprazole  40 mg Oral Daily  . sodium chloride flush  3 mL Intravenous Once  . sodium chloride flush  3 mL Intravenous Q12H  . tamsulosin  0.8 mg Oral QPC supper  . torsemide  40 mg Oral BID  . Warfarin - Pharmacist Dosing Inpatient   Does not apply q1800    Assessment: 65 yoM admitted with volume overload. Pt on warfarin PTA for hx of mural thromus. INR supratherapeutic on admit at 3.69.  INR today is 2.96 from 2.75. CBC stable on last check. No s/sx of bleeding. Remains on home amiodarone. 100% oral intake documented yesterday.    *Home Dose = 5mg  Thurs, 2.5mg  all other days  Goal of Therapy:  INR 2-3   Plan:  -Warfarin 2 mg PO x1 tonight -Daily protime  Antonietta Jewel, PharmD, [place] Clinical Pharmacist  Pager: [phone] Phone: [phone] Please check AMION for all [place] numbers 01/04/2019

## 2019-01-04 NOTE — Plan of Care (Signed)
  Problem: Education: Goal: Ability to demonstrate management of disease process will improve 01/04/2019 1709 by Sherron Flemings, RN Outcome: Progressing 01/04/2019 1705 by Sherron Flemings, RN Outcome: Progressing Goal: Ability to verbalize understanding of medication therapies will improve Outcome: Progressing   Problem: Cardiac: Goal: Ability to achieve and maintain adequate cardiopulmonary perfusion will improve Outcome: Progressing

## 2019-01-04 NOTE — Progress Notes (Signed)
PROGRESS NOTE  Spencer Rice ZHG:[id number] DOB: 06-Apr-1941 DOA: 12/29/2018 PCP: [name]., MD   LOS: 6 days   Brief Narrative / Interim history:  78 year old male with history of systolic CHF with EF less than 20%, history of biventricular fibrillator, hypertension, chronic kidney disease stage III, COPD on chronic oxygen at home 2 L, prior history of kidney cancer status post cryoablation in 2015 who had several ED visits over the last month with progressive shortness of breath, he was also seen as an outpatient in his cardiology office, his diuretics were adjusted but despite that he has had progressive worsening shortness of breath and eventually was admitted to the hospital on 1/27.  Reports feeling extremely short of breath every time he even tries to lay back and he has been sitting upright and sleeping in a chair over the last few days.  He can barely ambulate due to dyspnea.  In the ED he was found to be marked fluid overloaded on chest x-ray, exam, and BNP was about 4500.  He was placed on IV Lasix and was admitted to the hospital.  Subjective: -Patient denies any complaints today, patient had urine retention problems after Foley catheter discontinuation over last 24 hours, so far required in and out x2  .  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 -Management per CHF team, he diuresed very well on Lasix drip, no function remained stable, currently he is transitioned to torsemide, with good diuresis overall, -2.6 L over last 24 hours, weight down from 181.9 pounds to 177.8 pounds over last 24 hours, continue with current diuresis especially renal function is stable, will replete his potassium -He has biventricular pacer  -being Evaluated for LVAD, plan for right heart cath this Monday -No Aldactone/ARB/digoxin with CKD  Hypertension -Blood pressure stable, he is off beta-blockers,  Chronic kidney disease stage III -Creatinine around 2, appears to be stable despite diuresis, continue to monitor closely, continue to 1.5 today  History of apical mural thrombus -Continue Coumadin, pharmacy to dose,   UTI -Culture growing Klebsiella pneumonia, continue with Rocephin, to treat total of 7 days  COPD on chronic home O2 -No wheezing, this appears to be stable, continue 2 L nasal cannula which is his baseline  Left kidney cancer -Status post cryoablation in 2015  Hypokalemia -Continue to diuresis, repleted  Urinary retention -resumedon Flomax, for required in and out x2 over last 24 hours, will continue to monitor closely, will check bladder scan, if continues to have urinary retention, will need reinsertion of his Foley catheter.   Bilateral lower extremity wounds -Patient with Unna boots, change every Wednesday  Scheduled Meds: . ALPRAZolam  0.25 mg Oral Once  . amiodarone  200 mg Oral Daily  . lactobacillus  1 g Oral TID WC  . mouth rinse  15 mL Mouth Rinse BID  . mometasone-formoterol  2 puff Inhalation BID  . pantoprazole  40 mg Oral Daily  . potassium chloride  40 mEq Oral Once  . sodium chloride flush  3 mL Intravenous Once  . sodium chloride flush  3 mL Intravenous Q12H  . tamsulosin  0.8 mg Oral QPC supper  . torsemide  40 mg Oral BID  . warfarin  2 mg Oral ONCE-1800  . Warfarin - Pharmacist Dosing  Inpatient   Does not apply q1800   Continuous Infusions: . sodium chloride    . cefTRIAXone (ROCEPHIN)  IV Stopped (01/03/19 1638)  . milrinone 0.25 mcg/kg/min (01/04/19 0910)   PRN Meds:.sodium chloride, acetaminophen, ondansetron (ZOFRAN) IV, sodium chloride flush, sodium chloride flush  DVT prophylaxis: Coumadin Code Status: Full code Family Communication: no family at bedside  Disposition Plan: TBD  Consultants:   Cardiology - heart failure  Procedures:   2D echo: pending  Antimicrobials:  None    Objective: Vitals:   01/04/19 0511 01/04/19 0756 01/04/19 1100 01/04/19 1312  BP: 108/69 113/71  109/70  Pulse: 80 97 94 85  Resp: 18     Temp: 98.3 F (36.8 C) 98.3 F (36.8 C)  98.4 F (36.9 C)  TempSrc: Oral Oral  Oral  SpO2: 99% 100% 100% 95%  Weight: 80.6 kg     Height:        Intake/Output Summary (Last 24 hours) at 01/04/2019 1317 Last data filed at 01/04/2019 1239 Gross per 24 hour  Intake 784.92 ml  Output 3725 ml  Net -2940.08 ml   Filed Weights   01/02/19 0359 01/03/19 0338 01/04/19 0511  Weight: 85.7 kg 82.5 kg 80.6 kg    Examination:  Awake Alert, Oriented X 3, No new F.N deficits, Normal affect Symmetrical Chest wall movement, Good air movement bilaterally, CTAB RRR,No Gallops,Rubs or new Murmurs, No Parasternal Heave +ve B.Sounds, Abd Soft, suprapubic tenderness no rebound - guarding or rigidity. No Cyanosis, Clubbing , +2 edema, Unna boots bilaterally     data Reviewed: I have independently reviewed following labs and imaging studies   CBC: Recent Labs  Lab 12/28/18 1803 12/29/18 1942 12/31/18 0320 01/01/19 1046  WBC 5.7 5.7 4.8 5.7  NEUTROABS  --  4.0  --   --   HGB 13.9 13.6 13.0 13.2  HCT 43.8 43.1 39.9 39.2  MCV 81.3 81.9 79.2* 79.7*  PLT 139* 144* 127* 341*   Basic Metabolic Panel: Recent Labs  Lab 12/31/18 0320 01/01/19 1046 01/02/19 0559 01/03/19 0515 01/04/19 0546  NA 140 136 137 134* 134*  K 3.9 3.2* 3.9 4.1  3.3*  CL 97* 90* 93* 89* 89*  CO2 28 31 33* 31 31  GLUCOSE 98 133* 136* 145* 281*  BUN 33* 37* 36* 37* 32*  CREATININE 2.04* 2.05* 1.95* 1.66* 1.52*  CALCIUM 9.0 9.2 8.8* 9.1 8.6*  MG  --   --  2.2  --   --    GFR: Estimated Creatinine Clearance: 46.5 mL/min (A) (by C-G formula based on SCr of 1.52 mg/dL (H)). Liver Function Tests: Recent Labs  Lab 12/29/18 1942 01/04/19 0546  AST 39 36  ALT 21 20  ALKPHOS 111 107  BILITOT 2.8* 3.1*  PROT 7.1 6.8  ALBUMIN 3.1* 2.7*   No results for input(s): LIPASE, AMYLASE in the last 168 hours. Recent Labs  Lab 01/04/19 0547  AMMONIA 41*   Coagulation Profile: Recent Labs  Lab 12/31/18 0914 01/01/19 1046 01/02/19 0559 01/03/19 0515 01/04/19 0546  INR 3.59 2.62 2.53 2.75 2.96   Cardiac Enzymes: Recent Labs  Lab 12/28/18 1821 12/29/18 1942  TROPONINI 0.05* 0.05*   BNP (last 3 results) No results for input(s): PROBNP in the last 8760 hours. HbA1C: No results for input(s): HGBA1C in the last 72 hours. CBG: No results for input(s): GLUCAP in the last 168 hours. Lipid Profile: No results for input(s): CHOL, HDL, LDLCALC, TRIG, CHOLHDL, LDLDIRECT in the last 72 hours. Thyroid Function Tests: No results for input(s): TSH, T4TOTAL, FREET4, T3FREE, THYROIDAB in the last 72 hours. Anemia Panel: No results for input(s): VITAMINB12, FOLATE, FERRITIN, TIBC, IRON, RETICCTPCT in the last 72 hours. Urine analysis:    Component Value Date/Time   COLORURINE YELLOW 12/29/2018 1943   APPEARANCEUR CLEAR 12/29/2018 1943   LABSPEC 1.015 12/29/2018 1943   PHURINE 5.0 12/29/2018 [place] 12/29/2018 [place] (A) 12/29/2018 [place] 12/29/2018 [place] 12/29/2018 [place] NEGATIVE 12/29/2018 1943   NITRITE NEGATIVE 12/29/2018 1943   LEUKOCYTESUR MODERATE (A) 12/29/2018 1943   Sepsis Labs: Invalid input(s): PROCALCITONIN, LACTICIDVEN  Recent Results (from the  past 240 hour(s))  Urine culture     Status: Abnormal   Collection Time: 12/29/18  7:43 PM  Result Value Ref Range Status   Specimen Description   Final    URINE, RANDOM Performed at [facility], [address] [place], [place] [address]    Special Requests   Final    NONE Performed at [facility], [address] [place], [place] [address]    Culture >=100,000 COLONIES/mL KLEBSIELLA PNEUMONIAE (A)  Final   Report Status 01/01/2019 FINAL  Final   Organism ID, Bacteria KLEBSIELLA PNEUMONIAE (A)  Final      Susceptibility   Klebsiella pneumoniae - MIC*    AMPICILLIN >=32 RESISTANT Resistant     CEFAZOLIN <=4 SENSITIVE Sensitive     CEFTRIAXONE <=1 SENSITIVE Sensitive     CIPROFLOXACIN <=0.25 SENSITIVE Sensitive     GENTAMICIN <=1 SENSITIVE Sensitive     IMIPENEM <=0.25 SENSITIVE Sensitive     NITROFURANTOIN 128 RESISTANT Resistant     TRIMETH/SULFA <=20 SENSITIVE Sensitive     AMPICILLIN/SULBACTAM >=32 RESISTANT Resistant     PIP/TAZO <=4 SENSITIVE Sensitive     Extended ESBL NEGATIVE Sensitive     * >=100,000 COLONIES/mL KLEBSIELLA PNEUMONIAE      Radiology Studies: No results found.  Phillips Climes MD [facility]  Contact via  [email or web address]

## 2019-01-04 NOTE — Progress Notes (Signed)
In and Out cath done. 875 ml concentrated, clear urine out. Pt tolerated well.

## 2019-01-04 NOTE — Plan of Care (Signed)
  Problem: Education: Goal: Ability to demonstrate management of disease process will improve Outcome: Progressing   Problem: Cardiac: Goal: Ability to achieve and maintain adequate cardiopulmonary perfusion will improve Outcome: Progressing

## 2019-01-05 LAB — BASIC METABOLIC PANEL
Anion gap: 14 (ref 5–15)
BUN: 32 mg/dL — ABNORMAL HIGH (ref 8–23)
CO2: 32 mmol/L (ref 22–32)
Calcium: 8.8 mg/dL — ABNORMAL LOW (ref 8.9–10.3)
Chloride: 94 mmol/L — ABNORMAL LOW (ref 98–111)
Creatinine, Ser: 1.72 mg/dL — ABNORMAL HIGH (ref 0.61–1.24)
GFR calc Af Amer: 43 mL/min — ABNORMAL LOW (ref >60)
GFR calc non Af Amer: 38 mL/min — ABNORMAL LOW (ref >60)
Glucose, Bld: 132 mg/dL — ABNORMAL HIGH (ref 70–99)
Potassium: 3.6 mmol/L (ref 3.5–5.1)
Sodium: 140 mmol/L (ref 135–145)

## 2019-01-05 LAB — CBC
HCT: 39 % (ref 39.0–52.0)
Hemoglobin: 12.8 g/dL — ABNORMAL LOW (ref 13.0–17.0)
MCH: 26.3 pg (ref 26.0–34.0)
MCHC: 32.8 g/dL (ref 30.0–36.0)
MCV: 80.2 fL (ref 80.0–100.0)
NRBC: 0 % (ref 0.0–0.2)
Platelets: 136 10*3/uL — ABNORMAL LOW (ref 150–400)
RBC: 4.86 MIL/uL (ref 4.22–5.81)
RDW: 20.7 % — AB (ref 11.5–15.5)
WBC: 4.5 10*3/uL (ref 4.0–10.5)

## 2019-01-05 LAB — COOXEMETRY PANEL
Carboxyhemoglobin: 1.4 % (ref 0.5–1.5)
Methemoglobin: 1.4 % (ref 0.0–1.5)
O2 Saturation: 67.2 %
Total hemoglobin: 13.5 g/dL (ref 12.0–16.0)

## 2019-01-05 LAB — PROTIME-INR
INR: 2.56
Prothrombin Time: 27.1 seconds — ABNORMAL HIGH (ref 11.4–15.2)

## 2019-01-05 MED ORDER — SODIUM CHLORIDE 0.9% FLUSH
3.0000 mL | Freq: Two times a day (BID) | INTRAVENOUS | Status: DC
  Administered 2019-01-06 – 2019-01-07 (×2): 3 mL via INTRAVENOUS

## 2019-01-05 MED ORDER — SODIUM CHLORIDE 0.9 % IV SOLN: 250.0000 mL | INTRAVENOUS | Status: DC | PRN

## 2019-01-05 MED ORDER — ASPIRIN 81 MG PO CHEW
81.0000 mg | CHEWABLE_TABLET | ORAL | Status: AC
  Administered 2019-01-06: 81 mg via ORAL
  Filled 2019-01-05: qty 1

## 2019-01-05 MED ORDER — SODIUM CHLORIDE 0.9% FLUSH: 3.0000 mL | INTRAVENOUS | Status: DC | PRN

## 2019-01-05 MED ORDER — POTASSIUM CHLORIDE CRYS ER 20 MEQ PO TBCR
40.0000 meq | EXTENDED_RELEASE_TABLET | Freq: Once | ORAL | Status: AC
  Administered 2019-01-05: 40 meq via ORAL
  Filled 2019-01-05: qty 2

## 2019-01-05 MED ORDER — SODIUM CHLORIDE 0.9 % IV SOLN: INTRAVENOUS | Status: DC

## 2019-01-05 MED ORDER — ENSURE ENLIVE PO LIQD
237.0000 mL | Freq: Once | ORAL | Status: AC
  Administered 2019-01-05: 237 mL via ORAL

## 2019-01-05 NOTE — Progress Notes (Signed)
ANTICOAGULATION CONSULT NOTE - Follow-Up Consult  Pharmacy Consult for Coumadin Indication: apical mural thrombus  No Known Allergies  Patient Measurements: Height: 6' 1.5" (186.7 cm) Weight: 177 lb 6.4 oz (80.5 kg) IBW/kg (Calculated) : 81.05  Vital Signs: Temp: 98.8 F (37.1 C) (02/02 0859) Temp Source: Oral (02/02 0859) BP: 98/65 (02/02 0859) Pulse Rate: 86 (02/02 0859)  Labs: Recent Labs    01/03/19 0515 01/04/19 0546 01/05/19 0440  HGB  --   --  12.8*  HCT  --   --  39.0  PLT  --   --  136*  LABPROT 28.7* 30.4* 27.1*  INR 2.75 2.96 2.56  CREATININE 1.66* 1.52* 1.72*    Estimated Creatinine Clearance: 41 mL/min (A) (by C-G formula based on SCr of 1.72 mg/dL (H)).   Medical History: Past Medical History:  Diagnosis Date  . AICD (automatic cardioverter/defibrillator) present   . Cancer of left kidney ([place])    S/P OR 05/2014  . CHF (congestive heart failure) ([place])   . Coronary artery disease   . DVT (deep venous thrombosis) (HCC) 1983   LLE  . GERD (gastroesophageal reflux disease)   . History of hiatal hernia   . Hypertension     Medications:  Medications Prior to Admission  Medication Sig Dispense Refill Last Dose  . amiodarone (PACERONE) 200 MG tablet Take 200 mg by mouth daily.   12/29/2018 at am  . aspirin 81 MG chewable tablet Chew 81 mg by mouth.   12/29/2018 at am  . carvedilol (COREG) 12.5 MG tablet Take 6.25 mg by mouth 2 (two) times daily. Taking 1/2 tablet (6.25mg ) twice daily   12/29/2018 at am  . esomeprazole (NEXIUM) 20 MG capsule Take 20 mg by mouth daily as needed (FOR HEARTBURN).   unk at prn  . JARDIANCE 10 MG TABS tablet Take 10 mg by mouth daily.  1 12/29/2018 at Unknown time  . mometasone-formoterol (DULERA) 200-5 MCG/ACT AERO Inhale 2 puffs into the lungs 2 (two) times daily.   12/29/2018 at Unknown time  . Multiple Vitamin (MULTIVITAMIN WITH MINERALS) TABS tablet Take 1 tablet by mouth daily.   12/29/2018 at Unknown time  . omeprazole  (PRILOSEC) 20 MG capsule Take 20 mg by mouth daily.   12/29/2018 at Unknown time  . potassium chloride (K-DUR,KLOR-CON) 10 MEQ tablet Take 10 mEq by mouth every other day.   12/29/2018 at Unknown time  . sucralfate (CARAFATE) 1 g tablet Take 1 tablet (1 g total) by mouth 4 (four) times daily -  with meals and at bedtime. 28 tablet 0 12/29/2018 at Unknown time  . tamsulosin (FLOMAX) 0.4 MG CAPS capsule Take 0.4 mg by mouth.   12/29/2018 at Unknown time  . torsemide (DEMADEX) 20 MG tablet Take 40 mg by mouth 2 (two) times daily.   12/29/2018 at Unknown time  . warfarin (COUMADIN) 5 MG tablet Take 2.5 mg by mouth daily. Take 2.5 mg daily except 5 mg Thursday   12/28/2018 at 1800  . benzonatate (TESSALON) 100 MG capsule Take 1 capsule (100 mg total) by mouth every 8 (eight) hours. (Patient not taking: Reported on 12/30/2018) 21 capsule 0 Not Taking at Unknown time  . carvedilol (COREG) 3.125 MG tablet Take 1 tablet (3.125 mg total) by mouth 2 (two) times daily with a meal. (Patient not taking: Reported on 12/30/2018) 60 tablet 1 Not Taking at Unknown time  . torsemide (DEMADEX) 20 MG tablet Take 1.5 tablet (30mg ) in AM and 1 table (20mg )  in PM daily (Patient not taking: Reported on 12/30/2018) 75 tablet 1 Not Taking at Unknown time   Scheduled:  . ALPRAZolam  0.25 mg Oral Once  . amiodarone  200 mg Oral Daily  . lactobacillus  1 g Oral TID WC  . mouth rinse  15 mL Mouth Rinse BID  . mometasone-formoterol  2 puff Inhalation BID  . pantoprazole  40 mg Oral Daily  . potassium chloride  40 mEq Oral Once  . sodium chloride flush  3 mL Intravenous Once  . sodium chloride flush  3 mL Intravenous Q12H  . tamsulosin  0.8 mg Oral QPC supper  . torsemide  40 mg Oral BID  . Warfarin - Pharmacist Dosing Inpatient   Does not apply q1800    Assessment: 62 yoM admitted with volume overload. Pt on warfarin PTA for hx of mural thromus. INR supratherapeutic on admit at 3.69.  INR today decreased from 2.96 to 2.56,  received reduced dose of 2 mg last night. Plan for RHC tomorrow - will need to hold dose tonight. Hgb 12.8, plt 136. No s/sx of bleeding. Remains on home amiodarone. 100% oral intake documented yesterday.   *Home Dose = 5mg  Thurs, 2.5mg  all other days  Goal of Therapy:  INR 2-3   Plan:  -Hold warfarin dosing tonight for RHC -Will order 1 time dose of Ensure to help INR trend lower  -Daily INR  Antonietta Jewel, PharmD, BCCCP Clinical Pharmacist  Pager: [phone] Phone: [phone] Please check AMION for all [place] numbers 01/05/2019

## 2019-01-05 NOTE — Plan of Care (Signed)
  Problem: Education: Goal: Ability to verbalize understanding of medication therapies will improve Outcome: Progressing   Problem: Cardiac: Goal: Ability to achieve and maintain adequate cardiopulmonary perfusion will improve Outcome: Progressing   

## 2019-01-05 NOTE — Progress Notes (Signed)
Advanced Heart Failure Rounding Note  PCP-Cardiologist: No primary care provider on file.   Subjective:    Remains on milrinone 0.25. Co-ox stable at 67% Off lasix due to low CVP. CVP 3 today (checked personally)  Creatinine 1.5 -> 1.7   Continues to feel good. No SOB, orthopnea or PND.   Warfarin off for cath. INR 2.56    ECHO 01/01/2019  EF 10% with severe MR, severe TR,  Biatrial enlargement,  RV  moderately reduced. unction.    Objective:   Weight Range: 80.5 kg Body mass index is 23.09 kg/m.   Vital Signs:   Temp:  [98.3 F (36.8 C)-98.8 F (37.1 C)] 98.8 F (37.1 C) (02/02 0859) Pulse Rate:  [79-87] 86 (02/02 0859) Resp:  [18-20] 20 (02/02 0826) BP: (93-103)/(58-66) 98/65 (02/02 0859) SpO2:  [96 %-100 %] 98 % (02/02 0859) Weight:  [80.5 kg] 80.5 kg (02/02 0515) Last BM Date: 01/03/19  Weight change: Filed Weights   01/03/19 0338 01/04/19 0511 01/05/19 0515  Weight: 82.5 kg 80.6 kg 80.5 kg    Intake/Output:   Intake/Output Summary (Last 24 hours) at 01/05/2019 1459 Last data filed at 01/05/2019 1337 Gross per 24 hour  Intake 275 ml  Output 1550 ml  Net -1275 ml      Physical Exam   General:  Sitting in chair Well appearing. No resp difficulty HEENT: normal Neck: supple. no JVD. Carotids 2+ bilat; no bruits. No lymphadenopathy or thryomegaly appreciated. Cor: PMI laterally displaced. Regular rate & rhythm. 2/6 MR/TR Lungs: clear Abdomen: soft, nontender, nondistended. No hepatosplenomegaly. No bruits or masses. Good bowel sounds. Extremities: no cyanosis, clubbing, rash, edema + RUE PICCC Neuro: alert & orientedx3, cranial nerves grossly intact. moves all 4 extremities w/o difficulty. Affect pleasant    Telemetry   AV paced 80 Personally reviewed  EKG    N/a   Labs    CBC Recent Labs    01/05/19 0440  WBC 4.5  HGB 12.8*  HCT 39.0  MCV 80.2  PLT 224*   Basic Metabolic Panel Recent Labs    01/04/19 0546 01/05/19 0440  NA  134* 140  K 3.3* 3.6  CL 89* 94*  CO2 31 32  GLUCOSE 281* 132*  BUN 32* 32*  CREATININE 1.52* 1.72*  CALCIUM 8.6* 8.8*   Liver Function Tests Recent Labs    01/04/19 0546  AST 36  ALT 20  ALKPHOS 107  BILITOT 3.1*  PROT 6.8  ALBUMIN 2.7*   No results for input(s): LIPASE, AMYLASE in the last 72 hours. Cardiac Enzymes No results for input(s): CKTOTAL, CKMB, CKMBINDEX, TROPONINI in the last 72 hours.  BNP: BNP (last 3 results) Recent Labs    12/04/18 1743 12/28/18 1821 12/29/18 1943  BNP >4,500.0* >4,925.0* >4,500.0*    ProBNP (last 3 results) No results for input(s): PROBNP in the last 8760 hours.   D-Dimer No results for input(s): DDIMER in the last 72 hours. Hemoglobin A1C No results for input(s): HGBA1C in the last 72 hours. Fasting Lipid Panel No results for input(s): CHOL, HDL, LDLCALC, TRIG, CHOLHDL, LDLDIRECT in the last 72 hours. Thyroid Function Tests No results for input(s): TSH, T4TOTAL, T3FREE, THYROIDAB in the last 72 hours.  Invalid input(s): FREET3  Other results:   Imaging    No results found.   Medications:     Scheduled Medications: . ALPRAZolam  0.25 mg Oral Once  . amiodarone  200 mg Oral Daily  . feeding supplement (ENSURE ENLIVE)  237 mL Oral Once  . lactobacillus  1 g Oral TID WC  . mouth rinse  15 mL Mouth Rinse BID  . mometasone-formoterol  2 puff Inhalation BID  . pantoprazole  40 mg Oral Daily  . potassium chloride  40 mEq Oral Once  . sodium chloride flush  3 mL Intravenous Once  . sodium chloride flush  3 mL Intravenous Q12H  . tamsulosin  0.8 mg Oral QPC supper  . torsemide  40 mg Oral BID  . Warfarin - Pharmacist Dosing Inpatient   Does not apply q1800    Infusions: . sodium chloride    . cefTRIAXone (ROCEPHIN)  IV 1 g (01/04/19 1353)  . 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    Admitted with marked volume overload.    Assessment/Plan   1. A/C Systolic Heart Failure -> cardiogenic shock - due to NICM  , LHC 2015 nonobstructive disease. Has Biotronik BiV.   - Initial CO-OX 38% so milrinone started on 1/28. - ECHO 01/01/19 EF 10% with Severe MR/TR Biatrial enlargement RV with moderately reduced function.  - Remains on milrinone 0.25. Co-ox stable at 67% today - Weight down 18 pounds with diuresis. Renal function improved with diuresis.  - CVP 3-4Continue to hold lasix  - He remains on milrinone. Volume status and renal function much improved. He is clearly inotrope dependent. He met with VAD team on 1/30 and we had long talk about his situation and need for advanced therapies. He initially was unsure if he would want a man-made device to help the heart that God gave him. I had a long talk with him and his sons on 2/1 and they want to proceed with VAD w/u. We discussed the work-up process in depth, including the need to assess RV function more closely and he is set up for R/L cath on Monday but is on warfarin with INR 2.56. Remains off warfarin. Will need to delay cath as ideally would want to do R/L cath - D/w Dr. Prescott Gum  2. CKD Stage III, creatinine baseline 1.8-2.4  - Creatinine trending down to 1.7 today with inotrope dependence  3. LBBB -stable s/p BIvICD  4. COPD on chronic home oxygen -Sats stable on 2 liters oxygen.   5. L Kidney Cancer  -Had cryoablation in 2015 -Renal function improving with inotropes  6. PVCs  Started on amiodarone 2019 to suppress PVCs.  -Continue amiodarone 200 mg daily.    7. H/O Mural Thrombus -On coumadin. Holding for cath. Discussed dosing with PharmD personally.  8. Mitral  Regurgitation/Tricuspid Regurgitation -Severe MR/TR on ECHO 01/01/19  9. UTI, Klebsiella pneumoniae  On rocephin per primary team.       Length of Stay: 7  Glori Bickers, MD  2:59 PM   Advanced Heart Failure Team Pager [phone] (M-F; 7a - 4p)  Please contact [facility] for night-coverage after hours (4p -7a ) and weekends on [email or web address]

## 2019-01-05 NOTE — Progress Notes (Signed)
PROGRESS NOTE  Spencer Rice FFM:[id number] DOB: 14-Jun-1941 DOA: 12/29/2018 PCP: [name]., MD   LOS: 7 days   Brief Narrative / Interim history:  78 year old male with history of systolic CHF with EF less than 20%, history of biventricular fibrillator, hypertension, chronic kidney disease stage III, COPD on chronic oxygen at home 2 L, prior history of kidney cancer status post cryoablation in 2015 who had several ED visits over the last month with progressive shortness of breath, he was also seen as an outpatient in his cardiology office, his diuretics were adjusted but despite that he has had progressive worsening shortness of breath and eventually was admitted to the hospital on 1/27.  Reports feeling extremely short of breath every time he even tries to lay back and he has been sitting upright and sleeping in a chair over the last few days.  He can barely ambulate due to dyspnea.  In the ED he was found to be marked fluid overloaded on chest x-ray, exam, and BNP was about 4500.  He was placed on IV Lasix and was admitted to the hospital.  Subjective: -Patient denies any complaints today, he had some urinary retention overnight, has improved   Assessment & Plan: Principal Problem:   Acute on chronic systolic congestive heart failure (HCC) Active Problems:   Essential hypertension   Glucose intolerance (impaired glucose tolerance)   COPD (chronic obstructive pulmonary disease) (HCC)   Biventricular ICD (implantable cardioverter-defibrillator) in place   Apical mural thrombus without MI   CKD (chronic kidney disease) stage 3, GFR 30-59 ml/min (HCC)   PVCs (premature ventricular contractions)   Acute on chronic systolic CHF -Has a history of nonischemic cardiomyopathy based on left heart cath in 2015 -P 2D echo showing EF at 10%, with severe MR, severe TR, with biatrial enlargement, with RV moderately reduced. -Management per CHF team, has been doing very well with Lasix drip,  has been stopped giving low CVP's, currently on torsemide, diuresing very well with stable renal function, NS 2 L over last 24 hours, overall -19 L since admission . -He has biventricular pacer  -plan For right/left cardiac cath, as he is being evaluated for LVAD, cath time per cardiology. -No Aldactone/ARB/digoxin with CKD  Hypertension -Blood pressure stable, he is off beta-blockers,  Chronic kidney disease stage III -Creatinine around 2, appears to be stable despite diuresis, continue to monitor closely, continue remained stable at 1.7 today (slightly up from yesterday, continue to monitor ).  History of apical mural thrombus -On warfarin, pharmacy is dosing, hold in anticipation of cardiac cath  UTI -Culture growing Klebsiella pneumonia, continue with Rocephin, to treat total of 7 days  COPD on chronic home O2 -No wheezing, this appears to be stable, continue 2 L nasal cannula which is his baseline  Left kidney cancer -Status post cryoablation in 2015  Hypokalemia -Repleted  Urinary retention -resumedon Flomax, for required in and out x2 over last 24 hours, will continue to monitor closely, will check bladder scan, if continues to have urinary retention, will need reinsertion of his Foley catheter.   Bilateral lower extremity wounds -Patient with Unna boots, change every Wednesday  Scheduled Meds: . ALPRAZolam  0.25 mg Oral Once  . amiodarone  200 mg Oral Daily  . lactobacillus  1 g Oral TID WC  . mouth rinse  15 mL Mouth Rinse BID  . mometasone-formoterol  2 puff Inhalation BID  . pantoprazole  40 mg Oral Daily  . sodium chloride flush  3  mL Intravenous Once  . sodium chloride flush  3 mL Intravenous Q12H  . tamsulosin  0.8 mg Oral QPC supper  . torsemide  40 mg Oral BID  . Warfarin - Pharmacist Dosing Inpatient   Does not apply q1800   Continuous Infusions: . sodium chloride    . cefTRIAXone (ROCEPHIN)  IV 1 g (01/04/19 1353)  . milrinone 0.25 mcg/kg/min  (01/05/19 0004)   PRN Meds:.sodium chloride, acetaminophen, ondansetron (ZOFRAN) IV, sodium chloride flush, sodium chloride flush  DVT prophylaxis: Coumadin Code Status: Full code Family Communication: no family at bedside  Disposition Plan: TBD  Consultants:   Cardiology - heart failure  Procedures:   2D echo: pending  Antimicrobials:  None    Objective: Vitals:   01/05/19 0009 01/05/19 0515 01/05/19 0826 01/05/19 0859  BP: (!) 95/58   98/65  Pulse: 79  85 86  Resp:   20   Temp:  98.3 F (36.8 C)  98.8 F (37.1 C)  TempSrc:    Oral  SpO2: 100% 96% 97% 98%  Weight:  80.5 kg    Height:        Intake/Output Summary (Last 24 hours) at 01/05/2019 1258 Last data filed at 01/05/2019 0900 Gross per 24 hour  Intake 240 ml  Output 1350 ml  Net -1110 ml   Filed Weights   01/03/19 0338 01/04/19 0511 01/05/19 0515  Weight: 82.5 kg 80.6 kg 80.5 kg    Examination:  Awake Alert, Oriented X 3, No new F.N deficits, Normal affect Symmetrical Chest wall movement, Good air movement bilaterally, CTAB RRR,No Gallops,Rubs or new Murmurs, No Parasternal Heave +ve B.Sounds, Abd Soft, No tenderness, No rebound - guarding or rigidity. No Cyanosis, Clubbing , edema has improved, B/L unna boots    data Reviewed: I have independently reviewed following labs and imaging studies   CBC: Recent Labs  Lab 12/29/18 1942 12/31/18 0320 01/01/19 1046 01/05/19 0440  WBC 5.7 4.8 5.7 4.5  NEUTROABS 4.0  --   --   --   HGB 13.6 13.0 13.2 12.8*  HCT 43.1 39.9 39.2 39.0  MCV 81.9 79.2* 79.7* 80.2  PLT 144* 127* 133* 852*   Basic Metabolic Panel: Recent Labs  Lab 01/01/19 1046 01/02/19 0559 01/03/19 0515 01/04/19 0546 01/05/19 0440  NA 136 137 134* 134* 140  K 3.2* 3.9 4.1 3.3* 3.6  CL 90* 93* 89* 89* 94*  CO2 31 33* 31 31 32  GLUCOSE 133* 136* 145* 281* 132*  BUN 37* 36* 37* 32* 32*  CREATININE 2.05* 1.95* 1.66* 1.52* 1.72*  CALCIUM 9.2 8.8* 9.1 8.6* 8.8*  MG  --  2.2  --    --   --    GFR: Estimated Creatinine Clearance: 41 mL/min (A) (by C-G formula based on SCr of 1.72 mg/dL (H)). Liver Function Tests: Recent Labs  Lab 12/29/18 1942 01/04/19 0546  AST 39 36  ALT 21 20  ALKPHOS 111 107  BILITOT 2.8* 3.1*  PROT 7.1 6.8  ALBUMIN 3.1* 2.7*   No results for input(s): LIPASE, AMYLASE in the last 168 hours. Recent Labs  Lab 01/04/19 0547  AMMONIA 41*   Coagulation Profile: Recent Labs  Lab 01/01/19 1046 01/02/19 0559 01/03/19 0515 01/04/19 0546 01/05/19 0440  INR 2.62 2.53 2.75 2.96 2.56   Cardiac Enzymes: Recent Labs  Lab 12/29/18 1942  TROPONINI 0.05*   BNP (last 3 results) No results for input(s): PROBNP in the last 8760 hours. HbA1C: No results for input(s): HGBA1C in  the last 72 hours. CBG: No results for input(s): GLUCAP in the last 168 hours. Lipid Profile: No results for input(s): CHOL, HDL, LDLCALC, TRIG, CHOLHDL, LDLDIRECT in the last 72 hours. Thyroid Function Tests: No results for input(s): TSH, T4TOTAL, FREET4, T3FREE, THYROIDAB in the last 72 hours. Anemia Panel: No results for input(s): VITAMINB12, FOLATE, FERRITIN, TIBC, IRON, RETICCTPCT in the last 72 hours. Urine analysis:    Component Value Date/Time   COLORURINE YELLOW 12/29/2018 1943   APPEARANCEUR CLEAR 12/29/2018 1943   LABSPEC 1.015 12/29/2018 1943   PHURINE 5.0 12/29/2018 [place] 12/29/2018 [place] (A) 12/29/2018 [place] 12/29/2018 [place] 12/29/2018 [place] NEGATIVE 12/29/2018 1943   NITRITE NEGATIVE 12/29/2018 1943   LEUKOCYTESUR MODERATE (A) 12/29/2018 1943   Sepsis Labs: Invalid input(s): PROCALCITONIN, LACTICIDVEN  Recent Results (from the past 240 hour(s))  Urine culture     Status: Abnormal   Collection Time: 12/29/18  7:43 PM  Result Value Ref Range Status   Specimen Description   Final    URINE, RANDOM Performed at [facility], [address] [place], [place] [address]    Special Requests   Final    NONE Performed at [facility], [address] [place], [place] [address]    Culture >=100,000 COLONIES/mL KLEBSIELLA PNEUMONIAE (A)  Final   Report Status 01/01/2019 FINAL  Final   Organism ID, Bacteria KLEBSIELLA PNEUMONIAE (A)  Final      Susceptibility   Klebsiella pneumoniae - MIC*    AMPICILLIN >=32 RESISTANT Resistant     CEFAZOLIN <=4 SENSITIVE Sensitive     CEFTRIAXONE <=1 SENSITIVE Sensitive     CIPROFLOXACIN <=0.25 SENSITIVE Sensitive     GENTAMICIN <=1 SENSITIVE Sensitive     IMIPENEM <=0.25 SENSITIVE Sensitive     NITROFURANTOIN 128 RESISTANT Resistant     TRIMETH/SULFA <=20 SENSITIVE Sensitive     AMPICILLIN/SULBACTAM >=32 RESISTANT Resistant     PIP/TAZO <=4 SENSITIVE Sensitive     Extended ESBL NEGATIVE Sensitive     * >=100,000 COLONIES/mL KLEBSIELLA PNEUMONIAE      Radiology Studies: No results found.  Phillips Climes MD [facility]  Contact via  [email or web address]

## 2019-01-05 NOTE — Progress Notes (Signed)
Bladder Scan done = 541 ml.

## 2019-01-05 NOTE — Progress Notes (Signed)
Pt w/double lumen PICC. CVP line difficult to flush/draw blood back while attempting to draw AM labs. IV team notified to assess. Will continue to monitor and draw labs when able. Jessie Foot, RN

## 2019-01-06 ENCOUNTER — Inpatient Hospital Stay (HOSPITAL_COMMUNITY): Payer: Medicare Other

## 2019-01-06 DIAGNOSIS — E440 Moderate protein-calorie malnutrition: Secondary | ICD-10-CM

## 2019-01-06 LAB — COMPREHENSIVE METABOLIC PANEL
ALBUMIN: 2.5 g/dL — AB (ref 3.5–5.0)
ALT: 19 U/L (ref 0–44)
ANION GAP: 11 (ref 5–15)
AST: 49 U/L — ABNORMAL HIGH (ref 15–41)
Alkaline Phosphatase: 98 U/L (ref 38–126)
BUN: 36 mg/dL — ABNORMAL HIGH (ref 8–23)
CO2: 29 mmol/L (ref 22–32)
Calcium: 8.7 mg/dL — ABNORMAL LOW (ref 8.9–10.3)
Chloride: 98 mmol/L (ref 98–111)
Creatinine, Ser: 1.49 mg/dL — ABNORMAL HIGH (ref 0.61–1.24)
GFR calc Af Amer: 52 mL/min — ABNORMAL LOW (ref >60)
GFR calc non Af Amer: 45 mL/min — ABNORMAL LOW (ref >60)
Glucose, Bld: 146 mg/dL — ABNORMAL HIGH (ref 70–99)
POTASSIUM: 4.1 mmol/L (ref 3.5–5.1)
Sodium: 138 mmol/L (ref 135–145)
Total Bilirubin: 2.8 mg/dL — ABNORMAL HIGH (ref 0.3–1.2)
Total Protein: 6.3 g/dL — ABNORMAL LOW (ref 6.5–8.1)

## 2019-01-06 LAB — CBC WITH DIFFERENTIAL/PLATELET
Abs Immature Granulocytes: 0.01 10*3/uL (ref 0.00–0.07)
Basophils Absolute: 0 10*3/uL (ref 0.0–0.1)
Basophils Relative: 0 %
EOS PCT: 2 %
Eosinophils Absolute: 0.1 10*3/uL (ref 0.0–0.5)
HCT: 36.9 % — ABNORMAL LOW (ref 39.0–52.0)
Hemoglobin: 12 g/dL — ABNORMAL LOW (ref 13.0–17.0)
Immature Granulocytes: 0 %
Lymphocytes Relative: 19 %
Lymphs Abs: 0.8 10*3/uL (ref 0.7–4.0)
MCH: 26.3 pg (ref 26.0–34.0)
MCHC: 32.5 g/dL (ref 30.0–36.0)
MCV: 80.9 fL (ref 80.0–100.0)
Monocytes Absolute: 0.7 10*3/uL (ref 0.1–1.0)
Monocytes Relative: 16 %
Neutro Abs: 2.9 10*3/uL (ref 1.7–7.7)
Neutrophils Relative %: 63 %
Platelets: 125 10*3/uL — ABNORMAL LOW (ref 150–400)
RBC: 4.56 MIL/uL (ref 4.22–5.81)
RDW: 20.4 % — ABNORMAL HIGH (ref 11.5–15.5)
WBC: 4.5 10*3/uL (ref 4.0–10.5)
nRBC: 0 % (ref 0.0–0.2)

## 2019-01-06 LAB — COOXEMETRY PANEL
Carboxyhemoglobin: 1.6 % — ABNORMAL HIGH (ref 0.5–1.5)
Carboxyhemoglobin: 1.9 % — ABNORMAL HIGH (ref 0.5–1.5)
Methemoglobin: 1.4 % (ref 0.0–1.5)
Methemoglobin: 1 % (ref 0.0–1.5)
O2 SAT: 65.7 %
O2 Saturation: 79.3 %
Total hemoglobin: 13.1 g/dL (ref 12.0–16.0)
Total hemoglobin: 13.1 g/dL (ref 12.0–16.0)

## 2019-01-06 LAB — ABO/RH: ABO/RH(D): O POS

## 2019-01-06 LAB — TYPE AND SCREEN
ABO/RH(D): O POS
Antibody Screen: NEGATIVE

## 2019-01-06 LAB — LIPID PANEL
Cholesterol: 98 mg/dL (ref 0–200)
HDL: 46 mg/dL (ref >40)
LDL Cholesterol: 41 mg/dL (ref 0–99)
TRIGLYCERIDES: 57 mg/dL (ref <150)
Total CHOL/HDL Ratio: 2.1 RATIO
VLDL: 11 mg/dL (ref 0–40)

## 2019-01-06 LAB — BASIC METABOLIC PANEL
Anion gap: 12 (ref 5–15)
BUN: 33 mg/dL — ABNORMAL HIGH (ref 8–23)
CO2: 30 mmol/L (ref 22–32)
Calcium: 8.6 mg/dL — ABNORMAL LOW (ref 8.9–10.3)
Chloride: 99 mmol/L (ref 98–111)
Creatinine, Ser: 1.49 mg/dL — ABNORMAL HIGH (ref 0.61–1.24)
GFR calc Af Amer: 52 mL/min — ABNORMAL LOW (ref >60)
GFR calc non Af Amer: 45 mL/min — ABNORMAL LOW (ref >60)
Glucose, Bld: 93 mg/dL (ref 70–99)
Potassium: 4.1 mmol/L (ref 3.5–5.1)
SODIUM: 141 mmol/L (ref 135–145)

## 2019-01-06 LAB — HEMOGLOBIN A1C
Hgb A1c MFr Bld: 6.8 % — ABNORMAL HIGH (ref 4.8–5.6)
Mean Plasma Glucose: 148.46 mg/dL

## 2019-01-06 LAB — RAPID URINE DRUG SCREEN, HOSP PERFORMED
Amphetamines: NOT DETECTED
Barbiturates: NOT DETECTED
Benzodiazepines: NOT DETECTED
COCAINE: NOT DETECTED
Opiates: NOT DETECTED
Tetrahydrocannabinol: NOT DETECTED

## 2019-01-06 LAB — URIC ACID: Uric Acid, Serum: 6.6 mg/dL (ref 3.7–8.6)

## 2019-01-06 LAB — PROTIME-INR
INR: 2.39
Prothrombin Time: 25.7 seconds — ABNORMAL HIGH (ref 11.4–15.2)

## 2019-01-06 LAB — APTT: APTT: 33 s (ref 24–36)

## 2019-01-06 LAB — LACTATE DEHYDROGENASE: LDH: 297 U/L — ABNORMAL HIGH (ref 98–192)

## 2019-01-06 LAB — PSA: Prostatic Specific Antigen: 2.87 ng/mL (ref 0.00–4.00)

## 2019-01-06 LAB — PREALBUMIN: Prealbumin: 15.1 mg/dL — ABNORMAL LOW (ref 18–38)

## 2019-01-06 LAB — T4, FREE: Free T4: 1.48 ng/dL (ref 0.82–1.77)

## 2019-01-06 LAB — TSH: TSH: 0.906 u[IU]/mL (ref 0.350–4.500)

## 2019-01-06 LAB — ANTITHROMBIN III: AntiThromb III Func: 71 % — ABNORMAL LOW (ref 75–120)

## 2019-01-06 MED ORDER — ADULT MULTIVITAMIN W/MINERALS CH
1.0000 | ORAL_TABLET | Freq: Every day | ORAL | Status: DC
  Administered 2019-01-06 – 2019-01-29 (×24): 1 via ORAL
  Filled 2019-01-06 (×24): qty 1

## 2019-01-06 MED ORDER — ENSURE ENLIVE PO LIQD
237.0000 mL | Freq: Once | ORAL | Status: AC
  Administered 2019-01-06: 237 mL via ORAL

## 2019-01-06 MED ORDER — ENSURE ENLIVE PO LIQD
237.0000 mL | Freq: Three times a day (TID) | ORAL | Status: DC
  Administered 2019-01-06 – 2019-01-12 (×13): 237 mL via ORAL

## 2019-01-06 NOTE — Progress Notes (Addendum)
Advanced Heart Failure Rounding Note  PCP-Cardiologist: No primary care provider on file.   Subjective:    Remains on milrinone 0.25. Coox 79%. Repeat ordered. Received torsemide 40 mg BID yesterday. CVP ~6. Creatinine improving 1.49 today.   Warfarin on hold for [facility]. INR 2.39.  Denies CP or SOB. Has not ambulated in hallways. No dizziness.    ECHO 01/01/2019  EF 10% with severe MR, severe TR,  Biatrial enlargement,  RV  moderately reduced function.   Objective:   Weight Range: 81.8 kg Body mass index is 23.48 kg/m.   Vital Signs:   Temp:  [97.8 F (36.6 C)-98.9 F (37.2 C)] 97.8 F (36.6 C) (02/03 0736) Pulse Rate:  [68-91] 86 (02/03 0736) Resp:  [18] 18 (02/02 2025) BP: (92-112)/(58-80) 94/80 (02/03 0736) SpO2:  [100 %] 100 % (02/03 0736) Weight:  [81.8 kg] 81.8 kg (02/03 0630) Last BM Date: 01/03/19  Weight change: Filed Weights   01/04/19 0511 01/05/19 0515 01/06/19 0630  Weight: 80.6 kg 80.5 kg 81.8 kg    Intake/Output:   Intake/Output Summary (Last 24 hours) at 01/06/2019 0914 Last data filed at 01/06/2019 0636 Gross per 24 hour  Intake 35 ml  Output 1500 ml  Net -1465 ml      Physical Exam   General: No resp difficulty. HEENT: Normal Neck: Supple. No JVD. Carotids 2+ bilat; no bruits. No thyromegaly or nodule noted. Cor: PMI laterally displaced. RRR, 2/6 MR/TR Lungs: CTAB, normal effort. Abdomen: Soft, non-tender, non-distended, no HSM. No bruits or masses. +BS  Extremities: No cyanosis, clubbing, or rash. R and LLE no edema. BLE unna boots. +RUE PICC  Neuro: Alert & orientedx3, cranial nerves grossly intact. moves all 4 extremities w/o difficulty. Affect pleasant  Telemetry   BiV paced 90s. Personally reviewed  EKG    No new tracings.   Labs    CBC Recent Labs    01/05/19 0440  WBC 4.5  HGB 12.8*  HCT 39.0  MCV 80.2  PLT 656*   Basic Metabolic Panel Recent Labs    01/05/19 0440 01/06/19 0529  NA 140 141  K 3.6 4.1    CL 94* 99  CO2 32 30  GLUCOSE 132* 93  BUN 32* 33*  CREATININE 1.72* 1.49*  CALCIUM 8.8* 8.6*   Liver Function Tests Recent Labs    01/04/19 0546  AST 36  ALT 20  ALKPHOS 107  BILITOT 3.1*  PROT 6.8  ALBUMIN 2.7*   No results for input(s): LIPASE, AMYLASE in the last 72 hours. Cardiac Enzymes No results for input(s): CKTOTAL, CKMB, CKMBINDEX, TROPONINI in the last 72 hours.  BNP: BNP (last 3 results) Recent Labs    12/04/18 1743 12/28/18 1821 12/29/18 1943  BNP >4,500.0* >4,925.0* >4,500.0*    ProBNP (last 3 results) No results for input(s): PROBNP in the last 8760 hours.   D-Dimer No results for input(s): DDIMER in the last 72 hours. Hemoglobin A1C No results for input(s): HGBA1C in the last 72 hours. Fasting Lipid Panel No results for input(s): CHOL, HDL, LDLCALC, TRIG, CHOLHDL, LDLDIRECT in the last 72 hours. Thyroid Function Tests No results for input(s): TSH, T4TOTAL, T3FREE, THYROIDAB in the last 72 hours.  Invalid input(s): FREET3  Other results:   Imaging    No results found.   Medications:     Scheduled Medications: . ALPRAZolam  0.25 mg Oral Once  . amiodarone  200 mg Oral Daily  . lactobacillus  1 g Oral TID WC  .  mouth rinse  15 mL Mouth Rinse BID  . mometasone-formoterol  2 puff Inhalation BID  . pantoprazole  40 mg Oral Daily  . sodium chloride flush  3 mL Intravenous Once  . sodium chloride flush  3 mL Intravenous Q12H  . sodium chloride flush  3 mL Intravenous Q12H  . tamsulosin  0.8 mg Oral QPC supper  . torsemide  40 mg Oral BID  . Warfarin - Pharmacist Dosing Inpatient   Does not apply q1800    Infusions: . sodium chloride    . sodium chloride    . sodium chloride    . cefTRIAXone (ROCEPHIN)  IV Stopped (01/05/19 1732)  . 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    Admitted with marked volume overload.    Assessment/Plan   1. A/C Systolic Heart Failure -> cardiogenic shock - due to NICM  , LHC 2015 nonobstructive disease. Has Biotronik BiV.   - Initial CO-OX 38% so milrinone started on 1/28. - ECHO 01/01/19 EF 10% with Severe MR/TR Biatrial enlargement RV with moderately reduced function.  - Remains on milrinone 0.25. Co-ox 79%. Recheck ordered.  - Weight down 25 pounds total with diuresis. Renal function improved with diuresis.  - CVP 6. Hold torsemide with ?LHC tomorrow. Creatinine improving 1.49 - He remains on milrinone. Volume status and renal function much improved. He is clearly inotrope dependent. He met with VAD team on 1/30 and we had long talk about his situation and need for advanced therapies. He initially was unsure if he would want a man-made device to help the heart that God gave him. I had a long talk with him and his sons on 2/1 and they want to proceed with VAD w/u. We discussed the work-up process in depth, including the need to assess RV function more closely and he is set up for R/L cath. INR remains elevated 2.39. R/LHC rescheduled for tomorrow at 12 pending INR.  - Dr Haroldine Laws discussed with Dr. Prescott Gum - LVAD coordinator to see him today.   2. CKD Stage III, creatinine baseline 1.8-2.4  - Creatinine trending down to 1.49 today with inotrope dependence  3. LBBB - stable s/p BiV ICD  4. COPD on chronic home oxygen - Sats stable on 2 liters oxygen. No change.   5. L Kidney Cancer  -Had cryoablation in 2015 -Renal function improving with inotropes. No change.   6. PVCs - Started on amiodarone 2019 to suppress PVCs.  - Continue amiodarone 200 mg daily. No change.   7. H/O  Mural Thrombus - Coumadin on hold for cath today. INR still 2.36.   8. 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 No change.   9. UTI, Klebsiella pneumoniae  - On rocephin per primary team. No change.       Length of Stay: [place], NP  9:14 AM  Advanced Heart Failure Team Pager [phone] (M-F; 7a - 4p)  Please contact [facility] for night-coverage after hours (4p -7a ) and weekends on [email or web address]  Patient seen and examined with the above-signed Advanced Practice Provider and/or Housestaff. I personally  reviewed laboratory data, imaging studies and relevant notes. I independently examined the patient and formulated the important aspects of the plan. I have edited the note to reflect any of my changes or salient points. I have personally discussed the plan with the patient and/or family.  He remains on milrinone. Co-ox improved. Volume status stable after diuresis. Creatinine down to 1.5 with inotropic support. Has agree to proceed with VAD evaluation. Plan CT and other studies today. R/L cath when INR 1.8 or less. Possibly tomorrow. Continue abx for UTI.   Glori Bickers, MD  2:06 PM

## 2019-01-06 NOTE — Progress Notes (Signed)
Physical Therapy Treatment Patient Details Name: Spencer Rice MRN: [id number] DOB: [birth year] Today's Date: 01/06/2019    History of Present Illness 78yo male with ongoing SOB, chest x-ray showing pulmonary edema and R pleural effusion. Admitted for CHF exacerbation. PMH kidney cancer, CHF, hx DVT, AICD/PPM plancement, COPD, apical thrombus on Coumadin, hx cardiac cath     PT Comments    Pt admitted with above diagnosis. Pt currently with functional limitations due to the deficits listed below (see PT Problem List). Pt was able to ambulate with RW with good stability overall.  Should continue to progress well.  Pt will benefit from skilled PT to increase their independence and safety with mobility to allow discharge to the venue listed below.     Follow Up Recommendations  Home health PT     Equipment Recommendations  Rolling walker with 5" wheels    Recommendations for Other Services       Precautions / Restrictions Precautions Precautions: Fall Restrictions Weight Bearing Restrictions: No    Mobility  Bed Mobility               General bed mobility comments: OOB in chair   Transfers Overall transfer level: Needs assistance Equipment used: None Transfers: Sit to/from Stand Sit to Stand: Supervision         General transfer comment: S for safety, no other physical assist given   Ambulation/Gait Ambulation/Gait assistance: Supervision Gait Distance (Feet): 300 Feet Assistive device: Rolling walker (2 wheeled) Gait Pattern/deviations: WFL(Within Functional Limits);Step-through pattern Gait velocity: decreased  Gait velocity interpretation: 1.31 - 2.62 ft/sec, indicative of limited community ambulator General Gait Details: gait pattern generally WNL with no unsteadiness noted. VSS throughout. On 2LO2 with sats >90%.    Stairs             Wheelchair Mobility    Modified Rankin (Stroke Patients Only)       Balance Overall balance assessment:  No apparent balance deficits (not formally assessed)                                          Cognition Arousal/Alertness: Awake/alert Behavior During Therapy: WFL for tasks assessed/performed Overall Cognitive Status: Within Functional Limits for tasks assessed                                        Exercises General Exercises - Lower Extremity Ankle Circles/Pumps: AROM;Both;10 reps;Seated Long Arc Quad: AROM;Both;10 reps;Seated    General Comments        Pertinent Vitals/Pain Pain Assessment: No/denies pain    Home Living                      Prior Function            PT Goals (current goals can now be found in the care plan section) Acute Rehab PT Goals Patient Stated Goal: go home Progress towards PT goals: Progressing toward goals    Frequency    Min 3X/week      PT Plan Current plan remains appropriate    Co-evaluation              AM-PAC PT "6 Clicks" Mobility   Outcome Measure  Help needed turning from your back to your side while in a flat bed  without using bedrails?: A Little Help needed moving from lying on your back to sitting on the side of a flat bed without using bedrails?: A Little Help needed moving to and from a bed to a chair (including a wheelchair)?: A Little Help needed standing up from a chair using your arms (e.g., wheelchair or bedside chair)?: A Little Help needed to walk in hospital room?: A Little Help needed climbing 3-5 steps with a railing? : A Little 6 Click Score: 18    End of Session Equipment Utilized During Treatment: Gait belt;Oxygen Activity Tolerance: Patient tolerated treatment well Patient left: in chair;with call bell/phone within reach Nurse Communication: Mobility status PT Visit Diagnosis: Muscle weakness (generalized) (M62.81);Other abnormalities of gait and mobility (R26.89)     Time: 0957-1010 PT Time Calculation (min) (ACUTE ONLY): 13 min  Charges:   $Gait Training: 8-22 mins                     [place] Pager:  [phone]  Office:  [place] 01/06/2019, 12:57 PM

## 2019-01-06 NOTE — Progress Notes (Signed)
Pre VAD Carotid  Completed - Results in Chart review CV proc. [facility], [place] 01/06/2019 2:42 PM

## 2019-01-06 NOTE — Progress Notes (Signed)
PROGRESS NOTE  Spencer Rice LOV:[id number] DOB: 24-Nov-1941 DOA: 12/29/2018 PCP: [name]., MD   LOS: 8 days   Brief Narrative / Interim history:  78 year old male with history of systolic CHF with EF less than 20%, history of biventricular fibrillator, hypertension, chronic kidney disease stage III, COPD on chronic oxygen at home 2 L, prior history of kidney cancer status post cryoablation in 2015 who had several ED visits over the last month with progressive shortness of breath, he was also seen as an outpatient in his cardiology office, his diuretics were adjusted but despite that he has had progressive worsening shortness of breath and eventually was admitted to the hospital on 1/27.  Reports feeling extremely short of breath every time he even tries to lay back and he has been sitting upright and sleeping in a chair over the last few days.  He can barely ambulate due to dyspnea.  In the ED he was found to be marked fluid overloaded on chest x-ray, exam, and BNP was about 4500.  Patient was seen by CHF team, started on milrinone drip, Lasix drip, diuresed very well, 2D echo showing EF at 10%, and is for right/left heart cath, he is being evaluated by LVAD team .  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 -He remains in milrinone drip, and torsemide for diuresis, initially on Cardizem drip, diuresing very well, stable renal function, he is down 25 pounds with diuresis since admission. -INR 2.39 today, plan for right/left cardiac cath, hopefully by tomorrow, pending INR, warfarin currently on hold -He is being evaluated by LVAD team -He has biventricular pacer  -No Aldactone/ARB/digoxin with CKD  Hypertension -Blood pressure stable, he is off beta-blockers,  Chronic kidney disease stage III -Baseline creatinine around 2, renal function remained stable, actually it did improve after he was started on milrinone and diuresis, continue to monitor closely   History of apical mural thrombus -On warfarin, pharmacy is dosing, hold in anticipation of cardiac cath  UTI -Culture growing Klebsiella pneumonia, continue with Rocephin, to treat total of 7 days  COPD on chronic home O2 -No wheezing, this appears to be stable, continue 2 L nasal cannula which is his baseline  Left kidney cancer -Status post cryoablation in 2015  Hypokalemia -Repleted  Urinary retention -resumedon Flomax, for required in and out x2 over last 24 hours, will continue to monitor closely,back on flomax, so no evidence of recurrent or urinary retention.   Bilateral lower extremity wounds -Patient with Unna boots, change every Wednesday  Scheduled Meds: . ALPRAZolam  0.25 mg Oral Once  . amiodarone  200 mg Oral Daily  . feeding supplement (ENSURE ENLIVE)  237 mL Oral TID BM  . lactobacillus  1 g Oral TID WC  . mouth rinse  15 mL Mouth Rinse BID  .  mometasone-formoterol  2 puff Inhalation BID  . multivitamin with minerals  1 tablet Oral Daily  . pantoprazole  40 mg Oral Daily  . sodium chloride flush  3 mL Intravenous Once  . sodium chloride flush  3 mL Intravenous Q12H  . sodium chloride flush  3 mL Intravenous Q12H  . tamsulosin  0.8 mg Oral QPC  supper  . Warfarin - Pharmacist Dosing Inpatient   Does not apply q1800   Continuous Infusions: . sodium chloride    . sodium chloride    . sodium chloride    . cefTRIAXone (ROCEPHIN)  IV Stopped (01/05/19 1732)  . milrinone 0.25 mcg/kg/min (01/05/19 1337)   PRN Meds:.sodium chloride, sodium chloride, acetaminophen, ondansetron (ZOFRAN) IV, sodium chloride flush, sodium chloride flush, sodium chloride flush  DVT prophylaxis: Coumadin Code Status: Full code Family Communication: no family at bedside  Disposition Plan: TBD  Consultants:   Cardiology - heart failure  Procedures:   2D echo: pending  Antimicrobials:  None    Objective: Vitals:   01/06/19 0627 01/06/19 0630 01/06/19 0736 01/06/19 1133  BP:   94/80 107/61  Pulse:   86 83  Resp:      Temp: 98.9 F (37.2 C)  97.8 F (36.6 C)   TempSrc:   Oral   SpO2:   100% 100%  Weight:  81.8 kg    Height:        Intake/Output Summary (Last 24 hours) at 01/06/2019 1255 Last data filed at 01/06/2019 1100 Gross per 24 hour  Intake 155 ml  Output 1710 ml  Net -1555 ml   Filed Weights   01/04/19 0511 01/05/19 0515 01/06/19 0630  Weight: 80.6 kg 80.5 kg 81.8 kg    Examination:  Awake Alert, Oriented X 3, No new F.N deficits, Normal affect Symmetrical Chest wall movement, Good air movement bilaterally, CTAB RRR,No Gallops,Rubs or new Murmurs, No Parasternal Heave +ve B.Sounds, Abd Soft, No tenderness, No rebound - guarding or rigidity. No Cyanosis, Clubbing , edema has improved, B/L unna boots    data Reviewed: I have independently reviewed following labs and imaging studies   CBC: Recent Labs  Lab 12/31/18 0320 01/01/19 1046 01/05/19 0440  WBC 4.8 5.7 4.5  HGB 13.0 13.2 12.8*  HCT 39.9 39.2 39.0  MCV 79.2* 79.7* 80.2  PLT 127* 133* 937*   Basic Metabolic Panel: Recent Labs  Lab 01/02/19 0559 01/03/19 0515 01/04/19 0546 01/05/19 0440 01/06/19 0529  NA 137 134* 134* 140 141  K 3.9 4.1 3.3* 3.6 4.1   CL 93* 89* 89* 94* 99  CO2 33* 31 31 32 30  GLUCOSE 136* 145* 281* 132* 93  BUN 36* 37* 32* 32* 33*  CREATININE 1.95* 1.66* 1.52* 1.72* 1.49*  CALCIUM 8.8* 9.1 8.6* 8.8* 8.6*  MG 2.2  --   --   --   --    GFR: Estimated Creatinine Clearance: 47.6 mL/min (A) (by C-G formula based on SCr of 1.49 mg/dL (H)). Liver Function Tests: Recent Labs  Lab 01/04/19 0546  AST 36  ALT 20  ALKPHOS 107  BILITOT 3.1*  PROT 6.8  ALBUMIN 2.7*   No results for input(s): LIPASE, AMYLASE in the last 168 hours. Recent Labs  Lab 01/04/19 0547  AMMONIA 41*   Coagulation Profile: Recent Labs  Lab 01/02/19 0559 01/03/19 0515 01/04/19 0546 01/05/19 0440 01/06/19 0529  INR 2.53 2.75 2.96 2.56 2.39   Cardiac Enzymes: No results for input(s): CKTOTAL, CKMB, CKMBINDEX, TROPONINI in the last  168 hours. BNP (last 3 results) No results for input(s): PROBNP in the last 8760 hours. HbA1C: No results for input(s): HGBA1C in the last 72 hours. CBG: No results for input(s): GLUCAP in the last 168 hours. Lipid Profile: No results for input(s): CHOL, HDL, LDLCALC, TRIG, CHOLHDL, LDLDIRECT in the last 72 hours. Thyroid Function Tests: No results for input(s): TSH, T4TOTAL, FREET4, T3FREE, THYROIDAB in the last 72 hours. Anemia Panel: No results for input(s): VITAMINB12, FOLATE, FERRITIN, TIBC, IRON, RETICCTPCT in the last 72 hours. Urine analysis:    Component Value Date/Time   COLORURINE YELLOW 12/29/2018 1943   APPEARANCEUR CLEAR 12/29/2018 1943   LABSPEC 1.015 12/29/2018 1943   PHURINE 5.0 12/29/2018 [place] 12/29/2018 [place] (A) 12/29/2018 [place] 12/29/2018 [place] 12/29/2018 [place] NEGATIVE 12/29/2018 1943   NITRITE NEGATIVE 12/29/2018 1943   LEUKOCYTESUR MODERATE (A) 12/29/2018 1943   Sepsis Labs: Invalid input(s): PROCALCITONIN, LACTICIDVEN  Recent Results (from the past 240 hour(s))  Urine culture      Status: Abnormal   Collection Time: 12/29/18  7:43 PM  Result Value Ref Range Status   Specimen Description   Final    URINE, RANDOM Performed at [facility], [address] [place], [place] [address]    Special Requests   Final    NONE Performed at [facility], [address] [place], [place] [address]    Culture >=100,000 COLONIES/mL KLEBSIELLA PNEUMONIAE (A)  Final   Report Status 01/01/2019 FINAL  Final   Organism ID, Bacteria KLEBSIELLA PNEUMONIAE (A)  Final      Susceptibility   Klebsiella pneumoniae - MIC*    AMPICILLIN >=32 RESISTANT Resistant     CEFAZOLIN <=4 SENSITIVE Sensitive     CEFTRIAXONE <=1 SENSITIVE Sensitive     CIPROFLOXACIN <=0.25 SENSITIVE Sensitive     GENTAMICIN <=1 SENSITIVE Sensitive     IMIPENEM <=0.25 SENSITIVE Sensitive     NITROFURANTOIN 128 RESISTANT Resistant     TRIMETH/SULFA <=20 SENSITIVE Sensitive     AMPICILLIN/SULBACTAM >=32 RESISTANT Resistant     PIP/TAZO <=4 SENSITIVE Sensitive     Extended ESBL NEGATIVE Sensitive     * >=100,000 COLONIES/mL KLEBSIELLA PNEUMONIAE      Radiology Studies: No results found.  Phillips Climes MD [facility]  Contact via  [email or web address]

## 2019-01-06 NOTE — Progress Notes (Signed)
Orthopedic Tech Progress Note Patient Details:  Spencer Rice 20-Feb-1941 [id number]  Ortho Devices Type of Ortho Device: Louretta Parma boot Ortho Device/Splint Interventions: Ordered, Application   Post Interventions Patient Tolerated: Well Instructions Provided: Adjustment of device, Care of device   Spencer Rice [name] 01/06/2019, 12:37 PM

## 2019-01-06 NOTE — Progress Notes (Signed)
ANTICOAGULATION CONSULT NOTE - Follow-Up Consult  Pharmacy Consult for Coumadin Indication: apical mural thrombus  No Known Allergies  Patient Measurements: Height: 6' 1.5" (186.7 cm) Weight: 180 lb 6.4 oz (81.8 kg) IBW/kg (Calculated) : 81.05  Vital Signs: Temp: 97.8 F (36.6 C) (02/03 0736) Temp Source: Oral (02/03 0736) BP: 94/80 (02/03 0736) Pulse Rate: 86 (02/03 0736)  Labs: Recent Labs    01/04/19 0546 01/05/19 0440 01/06/19 0529  HGB  --  12.8*  --   HCT  --  39.0  --   PLT  --  136*  --   LABPROT 30.4* 27.1* 25.7*  INR 2.96 2.56 2.39  CREATININE 1.52* 1.72* 1.49*    Estimated Creatinine Clearance: 47.6 mL/min (A) (by C-G formula based on SCr of 1.49 mg/dL (H)).   Medical History: Past Medical History:  Diagnosis Date  . AICD (automatic cardioverter/defibrillator) present   . Cancer of left kidney ([place])    S/P OR 05/2014  . CHF (congestive heart failure) ([place])   . Coronary artery disease   . DVT (deep venous thrombosis) (HCC) 1983   LLE  . GERD (gastroesophageal reflux disease)   . History of hiatal hernia   . Hypertension     Medications:  Medications Prior to Admission  Medication Sig Dispense Refill Last Dose  . amiodarone (PACERONE) 200 MG tablet Take 200 mg by mouth daily.   12/29/2018 at am  . aspirin 81 MG chewable tablet Chew 81 mg by mouth.   12/29/2018 at am  . carvedilol (COREG) 12.5 MG tablet Take 6.25 mg by mouth 2 (two) times daily. Taking 1/2 tablet (6.25mg ) twice daily   12/29/2018 at am  . esomeprazole (NEXIUM) 20 MG capsule Take 20 mg by mouth daily as needed (FOR HEARTBURN).   unk at prn  . JARDIANCE 10 MG TABS tablet Take 10 mg by mouth daily.  1 12/29/2018 at Unknown time  . mometasone-formoterol (DULERA) 200-5 MCG/ACT AERO Inhale 2 puffs into the lungs 2 (two) times daily.   12/29/2018 at Unknown time  . Multiple Vitamin (MULTIVITAMIN WITH MINERALS) TABS tablet Take 1 tablet by mouth daily.   12/29/2018 at Unknown time  . omeprazole  (PRILOSEC) 20 MG capsule Take 20 mg by mouth daily.   12/29/2018 at Unknown time  . potassium chloride (K-DUR,KLOR-CON) 10 MEQ tablet Take 10 mEq by mouth every other day.   12/29/2018 at Unknown time  . sucralfate (CARAFATE) 1 g tablet Take 1 tablet (1 g total) by mouth 4 (four) times daily -  with meals and at bedtime. 28 tablet 0 12/29/2018 at Unknown time  . tamsulosin (FLOMAX) 0.4 MG CAPS capsule Take 0.4 mg by mouth.   12/29/2018 at Unknown time  . torsemide (DEMADEX) 20 MG tablet Take 40 mg by mouth 2 (two) times daily.   12/29/2018 at Unknown time  . warfarin (COUMADIN) 5 MG tablet Take 2.5 mg by mouth daily. Take 2.5 mg daily except 5 mg Thursday   12/28/2018 at 1800  . benzonatate (TESSALON) 100 MG capsule Take 1 capsule (100 mg total) by mouth every 8 (eight) hours. (Patient not taking: Reported on 12/30/2018) 21 capsule 0 Not Taking at Unknown time  . carvedilol (COREG) 3.125 MG tablet Take 1 tablet (3.125 mg total) by mouth 2 (two) times daily with a meal. (Patient not taking: Reported on 12/30/2018) 60 tablet 1 Not Taking at Unknown time  . torsemide (DEMADEX) 20 MG tablet Take 1.5 tablet (30mg ) in AM and 1 table (20mg )  in PM daily (Patient not taking: Reported on 12/30/2018) 75 tablet 1 Not Taking at Unknown time   Scheduled:  . ALPRAZolam  0.25 mg Oral Once  . amiodarone  200 mg Oral Daily  . lactobacillus  1 g Oral TID WC  . mouth rinse  15 mL Mouth Rinse BID  . mometasone-formoterol  2 puff Inhalation BID  . pantoprazole  40 mg Oral Daily  . sodium chloride flush  3 mL Intravenous Once  . sodium chloride flush  3 mL Intravenous Q12H  . sodium chloride flush  3 mL Intravenous Q12H  . tamsulosin  0.8 mg Oral QPC supper  . torsemide  40 mg Oral BID  . Warfarin - Pharmacist Dosing Inpatient   Does not apply q1800    Assessment: 80 yoM admitted with volume overload. Pt on warfarin PTA for hx of mural thromus. INR supratherapeutic on admit at 3.69.  INR down to 2.36 today after holding  last night. Will need to continue to hold for [facility].  *Home Dose = 5mg  Thurs, 2.5mg  all other days  Goal of Therapy:  INR 2-3   Plan:  -Hold warfarin with need for [facility] -Daily INR  Arrie Senate, PharmD, BCPS Clinical Pharmacist [phone] Please check AMION for all [place] numbers 01/06/2019

## 2019-01-06 NOTE — Progress Notes (Signed)
VAST RN to bedside to evaluate PICC in right arm. Unit RN verbalized red port was extremely sluggish with blood return and was difficult to flush utilizing inline CVP tubing.  VAST RN attempted changing caps, drawing blood, and flushing both ports. Purple port with GBR and easy to flush; reconnected to infusions. Red port extremely sluggish with blood return and difficult to flush. Changed PICC dressing and noted small kink in line (4cms out) both before and after dsg change; line routed in different direction when dsg changed, which eliminated kink. After dressing change, line with good blood return and easy to flush via both ports. Labs collected through red port and flushed well before clamping at site and reconnecting inline tubing. Report given to oncoming shift to unclamp red port at pt's arm before attempting to draw labs.

## 2019-01-06 NOTE — Progress Notes (Signed)
Initial Nutrition Assessment  DOCUMENTATION CODES:   Non-severe (moderate) malnutrition in context of chronic illness  INTERVENTION:    Ensure Enlive po TID, each supplement provides 350 kcal and 20 grams of protein  Multivitamin daily  NUTRITION DIAGNOSIS:   Moderate Malnutrition related to chronic illness(CHF, COPD, CAD) as evidenced by moderate muscle depletion, moderate fat depletion.  GOAL:   Patient will meet greater than or equal to 90% of their needs  MONITOR:   PO intake, Supplement acceptance, Skin  REASON FOR ASSESSMENT:   Consult LVAD Eval  ASSESSMENT:   78 yo male with PMH of CAD, COPD, DM2, CHF, AICD, HTN, GERD who was admitted with SOB r/t CHF exacerbation.   LVAD evaluation is underway.   Patient reports poor intake at home because his wife uses too much salt when cooking. Since admission, he has been eating well, consuming 100% of meals. Offered to talk with wife (who is not at the hospital currently) about sodium restricted diet, but patient stated, "she knows about it." Suspect intake PTA was inadequate. Patient reports weight loss recently related to fluids. 20 lbs of fluids have been removed since admission.    Patient has been on a heart healthy, low sodium diet with excellent PO intake, consuming 100% of meals since admission.   Labs reviewed. Medications reviewed and include Lactinex, Flomax, Warfarin.   NUTRITION - FOCUSED PHYSICAL EXAM:    Most Recent Value  Orbital Region  Moderate depletion  Upper Arm Region  Moderate depletion  Thoracic and Lumbar Region  Moderate depletion  Buccal Region  Moderate depletion  Temple Region  Moderate depletion  Clavicle Bone Region  Moderate depletion  Clavicle and Acromion Bone Region  Moderate depletion  Scapular Bone Region  Mild depletion  Dorsal Hand  Mild depletion  Patellar Region  Unable to assess  Anterior Thigh Region  Unable to assess  Posterior Calf Region  Unable to assess  Edema (RD  Assessment)  Severe  Hair  Reviewed  Eyes  Reviewed  Mouth  Reviewed  Skin  Reviewed  Nails  Reviewed       Diet Order:   Diet Order            Diet NPO time specified Except for: Sips with Meds  Diet effective midnight        Diet 2 gram sodium Room service appropriate? Yes; Fluid consistency: Thin  Diet effective now              EDUCATION NEEDS:   Education needs have been addressed  Skin:  Skin Assessment: Reviewed RN Assessment  Last BM:  2/2  Height:   Ht Readings from Last 1 Encounters:  12/30/18 6' 1.5" (1.867 m)    Weight:   Wt Readings from Last 1 Encounters:  01/06/19 81.8 kg    Ideal Body Weight:  85 kg  BMI:  Body mass index is 23.48 kg/m.  Estimated Nutritional Needs:   Kcal:  2100-2400  Protein:  115-130 gm  Fluid:  2 L    Molli Barrows, RD, LDN, [place] Pager [phone] After Hours Pager [phone]

## 2019-01-06 NOTE — Care Management Note (Addendum)
Case Management Note  Patient Details  Name: Spencer Rice MRN: [id number] Date of Birth: 1941/07/26  Subjective/Objective:  78 yo male presented with SOB. 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 PCP: Dr. Rita Ohara; pharmacy of choice[facility] [place]. Patient and his sons has decided to proceed with VAD w/u, with  LVAD evaluation in process . CM will continue following for dispositional needs.   Expected Discharge Date:                  Expected Discharge Plan:  [place]  In-House Referral:  Clinical Social Work  Discharge planning Services  CM Consult  Post Acute Care Choice:  Home Health, Resumption of Svcs/PTA Provider Choice offered to:  Patient  DME Arranged:  N/A DME Agency:  NA  HH Arranged:  RN, Disease Management, PT HH Agency:  [place]  Status of Service:  In process, will continue to follow  If discussed at Long Length of Stay Meetings, dates discussed:    Additional Comments: 01/16/19 @ 1645-Umeka Wrench RNCM-CM consult acknowledged for [facility] needs. Patient is open to [facility] for [facility] PTA with HHPT recommended and will be added. DME will be arranged prior to patient transitioning home. LVAD work-up continues with CM to continue following for dispositional needs.   Midge Minium RN, BSN, NCM-BC, ACM-RN [phone] 01/06/2019, 3:31 PM

## 2019-01-06 NOTE — Progress Notes (Signed)
Patient refused ABG. Stated "they got enough blood already". RN made aware to let MD know. Patient in no distress at this time.

## 2019-01-07 ENCOUNTER — Encounter (HOSPITAL_COMMUNITY)
Admission: EM | Disposition: A | Discharge status: Skilled Nursing Facility | Payer: Self-pay | Source: Home / Self Care | Attending: Internal Medicine

## 2019-01-07 DIAGNOSIS — I2510 Atherosclerotic heart disease of native coronary artery without angina pectoris: Secondary | ICD-10-CM

## 2019-01-07 DIAGNOSIS — Z7189 Other specified counseling: Secondary | ICD-10-CM

## 2019-01-07 DIAGNOSIS — Z515 Encounter for palliative care: Secondary | ICD-10-CM

## 2019-01-07 HISTORY — PX: RIGHT/LEFT HEART CATH AND CORONARY ANGIOGRAPHY: CATH118266

## 2019-01-07 LAB — HEPATITIS B CORE ANTIBODY, TOTAL: HEP B C TOTAL AB: NEGATIVE

## 2019-01-07 LAB — POCT I-STAT EG7
Acid-Base Excess: 8 mmol/L — ABNORMAL HIGH (ref 0.0–2.0)
Acid-Base Excess: 9 mmol/L — ABNORMAL HIGH (ref 0.0–2.0)
Bicarbonate: 32.8 mmol/L — ABNORMAL HIGH (ref 20.0–28.0)
Bicarbonate: 34.1 mmol/L — ABNORMAL HIGH (ref 20.0–28.0)
Calcium, Ion: 1.14 mmol/L — ABNORMAL LOW (ref 1.15–1.40)
Calcium, Ion: 1.17 mmol/L (ref 1.15–1.40)
HCT: 40 % (ref 39.0–52.0)
HCT: 41 % (ref 39.0–52.0)
Hemoglobin: 13.6 g/dL (ref 13.0–17.0)
Hemoglobin: 13.9 g/dL (ref 13.0–17.0)
O2 SAT: 65 %
O2 Saturation: 66 %
PH VEN: 7.463 — AB (ref 7.250–7.430)
PO2 VEN: 33 mmHg (ref 32.0–45.0)
Potassium: 4 mmol/L (ref 3.5–5.1)
Potassium: 4 mmol/L (ref 3.5–5.1)
Sodium: 140 mmol/L (ref 135–145)
Sodium: 141 mmol/L (ref 135–145)
TCO2: 34 mmol/L — ABNORMAL HIGH (ref 22–32)
TCO2: 36 mmol/L — ABNORMAL HIGH (ref 22–32)
pCO2, Ven: 46.9 mmHg (ref 44.0–60.0)
pCO2, Ven: 47.7 mmHg (ref 44.0–60.0)
pH, Ven: 7.452 — ABNORMAL HIGH (ref 7.250–7.430)
pO2, Ven: 32 mmHg (ref 32.0–45.0)

## 2019-01-07 LAB — POCT I-STAT 7, (LYTES, BLD GAS, ICA,H+H)
Acid-Base Excess: 8 mmol/L — ABNORMAL HIGH (ref 0.0–2.0)
Bicarbonate: 32 mmol/L — ABNORMAL HIGH (ref 20.0–28.0)
Calcium, Ion: 1.13 mmol/L — ABNORMAL LOW (ref 1.15–1.40)
HCT: 41 % (ref 39.0–52.0)
Hemoglobin: 13.9 g/dL (ref 13.0–17.0)
O2 Saturation: 95 %
PO2 ART: 67 mmHg — AB (ref 83.0–108.0)
Potassium: 3.9 mmol/L (ref 3.5–5.1)
Sodium: 141 mmol/L (ref 135–145)
TCO2: 33 mmol/L — ABNORMAL HIGH (ref 22–32)
pCO2 arterial: 40.1 mmHg (ref 32.0–48.0)
pH, Arterial: 7.509 — ABNORMAL HIGH (ref 7.350–7.450)

## 2019-01-07 LAB — BASIC METABOLIC PANEL
Anion gap: 8 (ref 5–15)
BUN: 31 mg/dL — ABNORMAL HIGH (ref 8–23)
CO2: 32 mmol/L (ref 22–32)
Calcium: 9 mg/dL (ref 8.9–10.3)
Chloride: 101 mmol/L (ref 98–111)
Creatinine, Ser: 1.24 mg/dL (ref 0.61–1.24)
Glucose, Bld: 99 mg/dL (ref 70–99)
Potassium: 4 mmol/L (ref 3.5–5.1)
Sodium: 141 mmol/L (ref 135–145)

## 2019-01-07 LAB — PROTIME-INR
INR: 1.88
Prothrombin Time: 21.4 seconds — ABNORMAL HIGH (ref 11.4–15.2)

## 2019-01-07 LAB — HEPATITIS B SURFACE ANTIGEN: Hepatitis B Surface Ag: NEGATIVE

## 2019-01-07 LAB — HEPATITIS C ANTIBODY: HCV Ab: <0.1 s/co ratio (ref 0.0–0.9)

## 2019-01-07 LAB — HIV ANTIBODY (ROUTINE TESTING W REFLEX): HIV Screen 4th Generation wRfx: NONREACTIVE

## 2019-01-07 LAB — HEPATITIS B SURFACE ANTIBODY, QUANTITATIVE: Hep B S AB Quant (Post): <3.1 m[IU]/mL — ABNORMAL LOW (ref >9.9)

## 2019-01-07 LAB — COOXEMETRY PANEL
Carboxyhemoglobin: 1.5 % (ref 0.5–1.5)
Methemoglobin: 1.5 % (ref 0.0–1.5)
O2 Saturation: 77.2 %
Total hemoglobin: 13.6 g/dL (ref 12.0–16.0)

## 2019-01-07 LAB — POCT ACTIVATED CLOTTING TIME: Activated Clotting Time: 186 seconds

## 2019-01-07 SURGERY — RIGHT/LEFT HEART CATH AND CORONARY ANGIOGRAPHY
Anesthesia: LOCAL

## 2019-01-07 MED ORDER — ALUM & MAG HYDROXIDE-SIMETH 200-200-20 MG/5ML PO SUSP
30.0000 mL | Freq: Four times a day (QID) | ORAL | Status: DC | PRN
  Administered 2019-01-08 – 2019-01-22 (×4): 30 mL via ORAL
  Filled 2019-01-07 (×4): qty 30

## 2019-01-07 MED ORDER — HEPARIN SODIUM (PORCINE) 1000 UNIT/ML IJ SOLN
INTRAMUSCULAR | Status: DC | PRN
  Administered 2019-01-07: 4000 [IU] via INTRAVENOUS

## 2019-01-07 MED ORDER — SODIUM CHLORIDE 0.9% FLUSH
3.0000 mL | INTRAVENOUS | Status: DC | PRN
  Administered 2019-01-19: 3 mL via INTRAVENOUS
  Filled 2019-01-07: qty 3

## 2019-01-07 MED ORDER — HEPARIN (PORCINE) 25000 UT/250ML-% IV SOLN
1600.0000 [IU]/h | INTRAVENOUS | Status: DC
  Administered 2019-01-07: 1150 [IU]/h via INTRAVENOUS
  Administered 2019-01-08 – 2019-01-11 (×5): 1400 [IU]/h via INTRAVENOUS
  Administered 2019-01-12: 1500 [IU]/h via INTRAVENOUS
  Administered 2019-01-13: 1600 [IU]/h via INTRAVENOUS
  Filled 2019-01-07 (×8): qty 250

## 2019-01-07 MED ORDER — VERAPAMIL HCL 2.5 MG/ML IV SOLN
INTRAVENOUS | Status: AC
  Filled 2019-01-07: qty 2

## 2019-01-07 MED ORDER — SODIUM CHLORIDE 0.9 % IV SOLN: INTRAVENOUS | Status: DC

## 2019-01-07 MED ORDER — VERAPAMIL HCL 2.5 MG/ML IV SOLN
INTRAVENOUS | Status: DC | PRN
  Administered 2019-01-07: 14:00:00 via INTRA_ARTERIAL

## 2019-01-07 MED ORDER — SODIUM CHLORIDE 0.9 % IV SOLN: INTRAVENOUS | Status: AC

## 2019-01-07 MED ORDER — SODIUM CHLORIDE 0.9 % IV SOLN: 250.0000 mL | INTRAVENOUS | Status: DC | PRN

## 2019-01-07 MED ORDER — ONDANSETRON HCL 4 MG/2ML IJ SOLN: 4.0000 mg | Freq: Four times a day (QID) | INTRAMUSCULAR | Status: DC | PRN

## 2019-01-07 MED ORDER — HEPARIN (PORCINE) IN NACL 1000-0.9 UT/500ML-% IV SOLN
INTRAVENOUS | Status: DC | PRN
  Administered 2019-01-07 (×2): 500 mL

## 2019-01-07 MED ORDER — SODIUM CHLORIDE 0.9% FLUSH
3.0000 mL | Freq: Two times a day (BID) | INTRAVENOUS | Status: DC
  Administered 2019-01-10 – 2019-01-29 (×27): 3 mL via INTRAVENOUS

## 2019-01-07 MED ORDER — ASPIRIN 81 MG PO CHEW
81.0000 mg | CHEWABLE_TABLET | ORAL | Status: AC
  Administered 2019-01-07: 81 mg via ORAL
  Filled 2019-01-07: qty 1

## 2019-01-07 MED ORDER — ACETAMINOPHEN 325 MG PO TABS
650.0000 mg | ORAL_TABLET | ORAL | Status: DC | PRN
  Administered 2019-01-10 – 2019-01-17 (×9): 650 mg via ORAL
  Filled 2019-01-07 (×9): qty 2

## 2019-01-07 MED ORDER — LIDOCAINE HCL (PF) 1 % IJ SOLN
INTRAMUSCULAR | Status: DC | PRN
  Administered 2019-01-07: 2 mL
  Administered 2019-01-07: 15 mL

## 2019-01-07 MED ORDER — IOHEXOL 350 MG/ML SOLN
INTRAVENOUS | Status: DC | PRN
  Administered 2019-01-07: 60 mL via INTRACARDIAC

## 2019-01-07 MED ORDER — HEPARIN SODIUM (PORCINE) 1000 UNIT/ML IJ SOLN
INTRAMUSCULAR | Status: AC
  Filled 2019-01-07: qty 1

## 2019-01-07 MED ORDER — HEPARIN (PORCINE) IN NACL 1000-0.9 UT/500ML-% IV SOLN
INTRAVENOUS | Status: AC
  Filled 2019-01-07: qty 1000

## 2019-01-07 MED ORDER — LIDOCAINE HCL (PF) 1 % IJ SOLN
INTRAMUSCULAR | Status: AC
  Filled 2019-01-07: qty 30

## 2019-01-07 SURGICAL SUPPLY — 13 items
CATH 5FR JL3.5 JR4 ANG PIG MP (CATHETERS) ×2 IMPLANT
CATH SWAN GANZ 7F STRAIGHT (CATHETERS) ×2 IMPLANT
DEVICE RAD COMP TR BAND LRG (VASCULAR PRODUCTS) ×2 IMPLANT
GLIDESHEATH SLEND SS 6F .021 (SHEATH) ×2 IMPLANT
GUIDEWIRE INQWIRE 1.5J.035X260 (WIRE) ×1 IMPLANT
INQWIRE 1.5J .035X260CM (WIRE) ×2
KIT HEART LEFT (KITS) ×2 IMPLANT
KIT MICROPUNCTURE NIT STIFF (SHEATH) ×2 IMPLANT
PACK CARDIAC CATHETERIZATION (CUSTOM PROCEDURE TRAY) ×2 IMPLANT
SHEATH PINNACLE 7F 10CM (SHEATH) ×2 IMPLANT
SHEATH PROBE COVER 6X72 (BAG) ×2 IMPLANT
TRANSDUCER W/STOPCOCK (MISCELLANEOUS) ×2 IMPLANT
TUBING CIL FLEX 10 FLL-RA (TUBING) ×2 IMPLANT

## 2019-01-07 NOTE — Progress Notes (Signed)
PROGRESS NOTE  Spencer Rice UVJ:[id number] DOB: 1941/07/25 DOA: 12/29/2018 PCP: [name]., MD   LOS: 9 days   Brief Narrative / Interim history:  78 year old male with history of systolic CHF with EF less than 20%, history of biventricular fibrillator, hypertension, chronic kidney disease stage III, COPD on chronic oxygen at home 2 L, prior history of kidney cancer status post cryoablation in 2015, had multiple ED visits, cardiology office recently for adjustment of his diuresis, was admitted for acute on chronic systolic CHF, management has been per CHF team, started on milrinone drip, Lasix drip, diuresed very well, 2D echo showing EF at 10%, and is for right/left heart cath, he is being evaluated by LVAD team .  Subjective: -Patient denies any complaints today, has any dyspnea on exertion, no chest pain, no fever or chills.  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 -Management per CHF team, he remains on milrinone drip, with significant improvement of his symptoms, initially on Lasix drip, currently on torsemide oral, diuresing fairly well. -Being considered for LVAD, plan for right/left cardiac cath today -He has biventricular pacer  -No Aldactone/ARB/digoxin with CKD  Hypertension -Blood pressure stable, he  is off beta-blockers,  Chronic kidney disease stage III -Baseline creatinine around 2, renal function remained stable, actually it did improve after he was started on milrinone and diuresis, continue to monitor closely   History of apical mural thrombus -He is on warfarin, pharmacy is dosing, currently on hold in anticipation of cardiac cath today  UTI -Culture growing Klebsiella pneumonia, treated for total of 7 days  COPD on chronic home O2 -No wheezing, this appears to be stable, continue 2 L nasal cannula which is his baseline  Left kidney cancer -Status post cryoablation in 2015  Hypokalemia -Repleted  Urinary retention -resumedon Flomax, for required in and out x2 over last 24 hours, will continue to monitor closely,back on flomax, so no evidence of recurrent or urinary retention.  Incidental finding finding of pulmonary nodule -Millimeter nodule in left upper lung noted on CT scan, -We will need PTT chest in 60-month   Bilateral lower extremity wounds -Patient with Unna boots, then changed during hospital stay by orthopedic take  Scheduled Meds: . ALPRAZolam  0.25 mg Oral Once  . amiodarone  200 mg Oral Daily  . feeding supplement (ENSURE ENLIVE)  237 mL Oral TID BM  . lactobacillus  1 g Oral TID WC  . mouth rinse  15 mL Mouth Rinse BID  . mometasone-formoterol  2 puff Inhalation BID  . multivitamin with minerals  1 tablet Oral Daily  . pantoprazole  40 mg Oral Daily  . sodium chloride flush  3 mL Intravenous Once  . sodium chloride flush  3 mL Intravenous Q12H  . sodium chloride flush  3 mL Intravenous Q12H  . tamsulosin  0.8 mg Oral QPC supper  . Warfarin -  Pharmacist Dosing Inpatient   Does not apply q1800   Continuous Infusions: . sodium chloride    . sodium chloride    . sodium chloride 10 mL/hr at 01/07/19 2248  . cefTRIAXone (ROCEPHIN)  IV 1 g (01/06/19 1643)  . milrinone 0.25 mcg/kg/min (01/07/19 0926)   PRN Meds:.sodium chloride, sodium chloride,  acetaminophen, ondansetron (ZOFRAN) IV, sodium chloride flush, sodium chloride flush, sodium chloride flush  DVT prophylaxis: Coumadin Code Status: Full code Family Communication: no family at bedside  Disposition Plan: TBD  Consultants:   Cardiology - heart failure  Procedures:   2D echo: pending  Antimicrobials:  None    Objective: Vitals:   01/07/19 0340 01/07/19 0748 01/07/19 0758 01/07/19 0848  BP: 99/68  108/70 105/78  Pulse: 81  86 85  Resp:    18  Temp: 98.3 F (36.8 C)  97.9 F (36.6 C) 98.5 F (36.9 C)  TempSrc: Oral  Oral Oral  SpO2: 100% 99% 100% 100%  Weight: 83.5 kg     Height:        Intake/Output Summary (Last 24 hours) at 01/07/2019 1133 Last data filed at 01/07/2019 1000 Gross per 24 hour  Intake 785.97 ml  Output 1075 ml  Net -289.03 ml   Filed Weights   01/05/19 0515 01/06/19 0630 01/07/19 0340  Weight: 80.5 kg 81.8 kg 83.5 kg    Examination:  Awake Alert, Oriented X 3, No new F.N deficits, Normal affect Symmetrical Chest wall movement, Good air movement bilaterally, CTAB RRR,No Gallops,Rubs or new Murmurs, No Parasternal Heave +ve B.Sounds, Abd Soft, No tenderness, No rebound - guarding or rigidity. B/L lower extremity Unna boots, right upper extremity PICC Lower extremity edema has improved     data Reviewed: I have independently reviewed following labs and imaging studies   CBC: Recent Labs  Lab 01/01/19 1046 01/05/19 0440 01/06/19 1940  WBC 5.7 4.5 4.5  NEUTROABS  --   --  2.9  HGB 13.2 12.8* 12.0*  HCT 39.2 39.0 36.9*  MCV 79.7* 80.2 80.9  PLT 133* 136* 250*   Basic Metabolic Panel: Recent Labs  Lab 01/02/19 0559  01/04/19 0546 01/05/19 0440 01/06/19 0529 01/06/19 1650 01/07/19 0339  NA 137   < > 134* 140 141 138 141  K 3.9   < > 3.3* 3.6 4.1 4.1 4.0  CL 93*   < > 89* 94* 99 98 101  CO2 33*   < > 31 32 30 29 32  GLUCOSE 136*   < > 281* 132* 93 146* 99  BUN 36*   < > 32* 32* 33* 36* 31*  CREATININE 1.95*   <  > 1.52* 1.72* 1.49* 1.49* 1.24  CALCIUM 8.8*   < > 8.6* 8.8* 8.6* 8.7* 9.0  MG 2.2  --   --   --   --   --   --    < > = values in this interval not displayed.   GFR: Estimated Creatinine Clearance: 57.2 mL/min (by C-G formula based on SCr of 1.24 mg/dL). Liver Function Tests: Recent Labs  Lab 01/04/19 0546 01/06/19 1650  AST 36 49*  ALT 20 19  ALKPHOS 107 98  BILITOT 3.1* 2.8*  PROT 6.8 6.3*  ALBUMIN 2.7* 2.5*   No results for input(s): LIPASE, AMYLASE in the last 168 hours. Recent Labs  Lab 01/04/19 0547  AMMONIA 41*   Coagulation Profile: Recent Labs  Lab 01/03/19 0515 01/04/19 0546 01/05/19 0440 01/06/19 0529 01/07/19 0339  INR  2.75 2.96 2.56 2.39 1.88   Cardiac Enzymes: No results for input(s): CKTOTAL, CKMB, CKMBINDEX, TROPONINI in the last 168 hours. BNP (last 3 results) No results for input(s): PROBNP in the last 8760 hours. HbA1C: Recent Labs    01/06/19 1937  HGBA1C 6.8*   CBG: No results for input(s): GLUCAP in the last 168 hours. Lipid Profile: Recent Labs    01/06/19 1650  CHOL 98  HDL 46  LDLCALC 41  TRIG 57  CHOLHDL 2.1   Thyroid Function Tests: Recent Labs    01/06/19 1650  TSH 0.906  FREET4 1.48   Anemia Panel: No results for input(s): VITAMINB12, FOLATE, FERRITIN, TIBC, IRON, RETICCTPCT in the last 72 hours. Urine analysis:    Component Value Date/Time   COLORURINE YELLOW 12/29/2018 1943   APPEARANCEUR CLEAR 12/29/2018 1943   LABSPEC 1.015 12/29/2018 1943   PHURINE 5.0 12/29/2018 [place] 12/29/2018 [place] (A) 12/29/2018 [place] 12/29/2018 [place] 12/29/2018 [place] NEGATIVE 12/29/2018 1943   NITRITE NEGATIVE 12/29/2018 1943   LEUKOCYTESUR MODERATE (A) 12/29/2018 1943   Sepsis Labs: Invalid input(s): PROCALCITONIN, LACTICIDVEN  Recent Results (from the past 240 hour(s))  Urine culture     Status: Abnormal   Collection Time: 12/29/18   7:43 PM  Result Value Ref Range Status   Specimen Description   Final    URINE, RANDOM Performed at [facility], [address] [place], [place] [address]    Special Requests   Final    NONE Performed at [facility], [address] [place], [place] [address]    Culture >=100,000 COLONIES/mL KLEBSIELLA PNEUMONIAE (A)  Final   Report Status 01/01/2019 FINAL  Final   Organism ID, Bacteria KLEBSIELLA PNEUMONIAE (A)  Final      Susceptibility   Klebsiella pneumoniae - MIC*    AMPICILLIN >=32 RESISTANT Resistant     CEFAZOLIN <=4 SENSITIVE Sensitive     CEFTRIAXONE <=1 SENSITIVE Sensitive     CIPROFLOXACIN <=0.25 SENSITIVE Sensitive     GENTAMICIN <=1 SENSITIVE Sensitive     IMIPENEM <=0.25 SENSITIVE Sensitive     NITROFURANTOIN 128 RESISTANT Resistant     TRIMETH/SULFA <=20 SENSITIVE Sensitive     AMPICILLIN/SULBACTAM >=32 RESISTANT Resistant     PIP/TAZO <=4 SENSITIVE Sensitive     Extended ESBL NEGATIVE Sensitive     * >=100,000 COLONIES/mL KLEBSIELLA PNEUMONIAE      Radiology Studies: Ct Abdomen Pelvis Wo Contrast  Result Date: 01/06/2019 CLINICAL DATA:  78 year old male under evaluation for potential candidacy for left ventricular assist device (LVAD). EXAM: CT CHEST, ABDOMEN AND PELVIS WITHOUT CONTRAST TECHNIQUE: Multidetector CT imaging of the chest, abdomen and pelvis was performed following the standard protocol without IV contrast. COMPARISON:  CT the abdomen and pelvis 06/17/2018. FINDINGS: CT CHEST FINDINGS Cardiovascular: Heart size is mildly enlarged. Left ventricular apex is immediately deep to the interspace between the anterolateral aspects of the left sixth and seventh ribs. There is no significant pericardial fluid, thickening or pericardial calcification. There is aortic atherosclerosis, as well as atherosclerosis of the great vessels of the mediastinum and the coronary arteries, including calcified atherosclerotic plaque in the left anterior  descending, left circumflex and right coronary arteries. Calcification of the ascending thoracic aorta is very minimal. Right upper extremity PICC with tip terminating in the mid superior vena cava. Left-sided biventricular pacemaker/AICD in place with lead tips terminating in  the right atrial appendage, right ventricular apex and overlying the lateral wall the left ventricle via the coronary sinus and coronary veins. Mediastinum/Nodes: No pathologically enlarged mediastinal or hilar lymph nodes. Please note that accurate exclusion of hilar adenopathy is limited on noncontrast CT scans. Esophagus is unremarkable in appearance. No axillary lymphadenopathy. Lungs/Pleura: Mild ground-glass attenuation and interlobular septal thickening noted in the mid to lower lungs bilaterally, favored to reflect a background of mild interstitial pulmonary edema. Small right pleural effusion lying dependently with areas of passive subsegmental atelectasis in the right lower lobe. No acute consolidative airspace disease. 6 mm nodule in the left upper lobe (axial image 32 of series 5). Musculoskeletal: There are no aggressive appearing lytic or blastic lesions noted in the visualized portions of the skeleton. CT ABDOMEN PELVIS FINDINGS Hepatobiliary: No definite suspicious cystic or solid hepatic lesions are confidently identified on today's noncontrast CT examination. Unenhanced appearance of the gallbladder is normal. Pancreas: No definite pancreatic mass or peripancreatic fluid or inflammatory changes are noted on today's noncontrast CT examination. Spleen: Unremarkable. Adrenals/Urinary Tract: Low-attenuation lesions in the left kidney measuring up to 2.4 cm in the lower pole, previously characterized as simple cysts. In the posterior aspect of the interpolar region of the left kidney there is again a focal area of architectural distortion which is partially calcified and contain some internal fatty attenuation, most compatible  with a post ablation defect. This is poorly evaluated on today's noncontrast CT examination. Right kidney and bilateral adrenal glands are normal in appearance. No hydroureteronephrosis. Urinary bladder is normal in appearance. Stomach/Bowel: Normal appearance of the stomach. No pathologic dilatation of small bowel or colon. Normal appendix. Vascular/Lymphatic: Aortic atherosclerosis. No lymphadenopathy noted in the abdomen or pelvis. Reproductive: Prostate gland and seminal vesicles are unremarkable in appearance. Other: No significant volume of ascites.  No pneumoperitoneum. Musculoskeletal: Subacute compression fracture of anterior aspect of superior endplate of L1 with 51% loss of anterior vertebral body height. There are no aggressive appearing lytic or blastic lesions noted in the visualized portions of the skeleton. IMPRESSION: 1. No imaging findings to suggest impediment to left ventricular assist device placement. Specifically, the degree of calcification of the ascending thoracic aorta is minimal, and there is no physical obstruction in the left upper quadrant of the abdomen. 2. Cardiomegaly with evidence of mild interstitial pulmonary edema and small right pleural effusions; imaging findings suggestive of congestive heart failure. 3. 6 mm left upper lobe pulmonary nodule. Non-contrast chest CT at 6-12 months is recommended. If the nodule is stable at time of repeat CT, then future CT at 18-24 months (from today's scan) is considered optional for low-risk patients, but is recommended for high-risk patients. This recommendation follows the consensus statement: Guidelines for Management of Incidental Pulmonary Nodules Detected on CT Images: From the Fleischner Society 2017; Radiology 2017; 284:228-243. 4. Subacute compression fracture of L1 vertebral body with 20% loss of anterior vertebral body height. 5. Additional incidental findings, as above. Electronically Signed   By: Vinnie Langton M.D.   On:  01/06/2019 14:23   Dg Orthopantogram  Result Date: 01/07/2019 CLINICAL DATA:  Preoperative evaluation. EXAM: ORTHOPANTOGRAM/PANORAMIC COMPARISON:  CT 06/19/2018. FINDINGS: No acute bony abnormality identified. Mandible appears to be intact. No focal lytic lesions/lucencies are identified. Adjacent paranasal sinuses appear to be clear. If maxillary or mandibular pathologist for suspected maxillofacial CT can be obtained. IMPRESSION: No acute abnormality identified. Electronically Signed   By: Marcello Moores  Register   On: 01/07/2019 07:43   Dg Chest 2  View  Result Date: 01/06/2019 CLINICAL DATA:  Preoperative evaluation. EXAM: CHEST - 2 VIEW COMPARISON:  CT chest 01/06/2019. Chest 12/30/2018 FINDINGS: Cardiac pacemaker. Diffuse cardiac enlargement. No significant vascular congestion. Small bilateral pleural effusions, greater on the right. Bilateral basilar atelectasis. No pneumothorax. No focal consolidation. Right PICC line with tip over the low SVC region. Tortuous aorta. Degenerative changes in the spine. IMPRESSION: Cardiac enlargement with small bilateral pleural effusions and basilar atelectasis, greater on the right. Electronically Signed   By: Lucienne Capers M.D.   On: 01/06/2019 19:01   Ct Chest Wo Contrast  Result Date: 01/06/2019 CLINICAL DATA:  78 year old male under evaluation for potential candidacy for left ventricular assist device (LVAD). EXAM: CT CHEST, ABDOMEN AND PELVIS WITHOUT CONTRAST TECHNIQUE: Multidetector CT imaging of the chest, abdomen and pelvis was performed following the standard protocol without IV contrast. COMPARISON:  CT the abdomen and pelvis 06/17/2018. FINDINGS: CT CHEST FINDINGS Cardiovascular: Heart size is mildly enlarged. Left ventricular apex is immediately deep to the interspace between the anterolateral aspects of the left sixth and seventh ribs. There is no significant pericardial fluid, thickening or pericardial calcification. There is aortic atherosclerosis, as  well as atherosclerosis of the great vessels of the mediastinum and the coronary arteries, including calcified atherosclerotic plaque in the left anterior descending, left circumflex and right coronary arteries. Calcification of the ascending thoracic aorta is very minimal. Right upper extremity PICC with tip terminating in the mid superior vena cava. Left-sided biventricular pacemaker/AICD in place with lead tips terminating in the right atrial appendage, right ventricular apex and overlying the lateral wall the left ventricle via the coronary sinus and coronary veins. Mediastinum/Nodes: No pathologically enlarged mediastinal or hilar lymph nodes. Please note that accurate exclusion of hilar adenopathy is limited on noncontrast CT scans. Esophagus is unremarkable in appearance. No axillary lymphadenopathy. Lungs/Pleura: Mild ground-glass attenuation and interlobular septal thickening noted in the mid to lower lungs bilaterally, favored to reflect a background of mild interstitial pulmonary edema. Small right pleural effusion lying dependently with areas of passive subsegmental atelectasis in the right lower lobe. No acute consolidative airspace disease. 6 mm nodule in the left upper lobe (axial image 32 of series 5). Musculoskeletal: There are no aggressive appearing lytic or blastic lesions noted in the visualized portions of the skeleton. CT ABDOMEN PELVIS FINDINGS Hepatobiliary: No definite suspicious cystic or solid hepatic lesions are confidently identified on today's noncontrast CT examination. Unenhanced appearance of the gallbladder is normal. Pancreas: No definite pancreatic mass or peripancreatic fluid or inflammatory changes are noted on today's noncontrast CT examination. Spleen: Unremarkable. Adrenals/Urinary Tract: Low-attenuation lesions in the left kidney measuring up to 2.4 cm in the lower pole, previously characterized as simple cysts. In the posterior aspect of the interpolar region of the left  kidney there is again a focal area of architectural distortion which is partially calcified and contain some internal fatty attenuation, most compatible with a post ablation defect. This is poorly evaluated on today's noncontrast CT examination. Right kidney and bilateral adrenal glands are normal in appearance. No hydroureteronephrosis. Urinary bladder is normal in appearance. Stomach/Bowel: Normal appearance of the stomach. No pathologic dilatation of small bowel or colon. Normal appendix. Vascular/Lymphatic: Aortic atherosclerosis. No lymphadenopathy noted in the abdomen or pelvis. Reproductive: Prostate gland and seminal vesicles are unremarkable in appearance. Other: No significant volume of ascites.  No pneumoperitoneum. Musculoskeletal: Subacute compression fracture of anterior aspect of superior endplate of L1 with 47% loss of anterior vertebral body height. There are no  aggressive appearing lytic or blastic lesions noted in the visualized portions of the skeleton. IMPRESSION: 1. No imaging findings to suggest impediment to left ventricular assist device placement. Specifically, the degree of calcification of the ascending thoracic aorta is minimal, and there is no physical obstruction in the left upper quadrant of the abdomen. 2. Cardiomegaly with evidence of mild interstitial pulmonary edema and small right pleural effusions; imaging findings suggestive of congestive heart failure. 3. 6 mm left upper lobe pulmonary nodule. Non-contrast chest CT at 6-12 months is recommended. If the nodule is stable at time of repeat CT, then future CT at 18-24 months (from today's scan) is considered optional for low-risk patients, but is recommended for high-risk patients. This recommendation follows the consensus statement: Guidelines for Management of Incidental Pulmonary Nodules Detected on CT Images: From the Fleischner Society 2017; Radiology 2017; 284:228-243. 4. Subacute compression fracture of L1 vertebral body  with 20% loss of anterior vertebral body height. 5. Additional incidental findings, as above. Electronically Signed   By: Vinnie Langton M.D.   On: 01/06/2019 14:23   Vas US Doppler Pre Vad  Result Date: 01/07/2019 PERIOPERATIVE VASCULAR EVALUATION Indications: Pre VAD work up. Performing Technologist: Toma Copier RVS  Examination Guidelines: A complete evaluation includes B-mode imaging, spectral Doppler, color Doppler, and power Doppler as needed of all accessible portions of each vessel. Bilateral testing is considered an integral part of a complete examination. Limited examinations for reoccurring indications may be performed as noted.  Right Carotid Findings: +----------+--------+--------+--------+----------------------+-----------------+           PSV cm/sEDV cm/sStenosisDescribe              Comments          +----------+--------+--------+--------+----------------------+-----------------+ CCA Prox  88      11                                    mild intimal wall                                                         changes           +----------+--------+--------+--------+----------------------+-----------------+ CCA Distal64      11                                    mild intimal wall                                                         changes           +----------+--------+--------+--------+----------------------+-----------------+ ICA Prox  27      7               focal and heterogenousminimal plaque at  the origin        +----------+--------+--------+--------+----------------------+-----------------+ ICA Mid   37      11                                                      +----------+--------+--------+--------+----------------------+-----------------+ ICA Distal                                              tortuous           +----------+--------+--------+--------+----------------------+-----------------+ ECA       97      13              heterogenous          mild plaque       +----------+--------+--------+--------+----------------------+-----------------+ Portions of this table do not appear on this page. +----------+--------+-------+--------+------------+           PSV cm/sEDV cmsDescribeArm Pressure +----------+--------+-------+--------+------------+ Subclavian88                                  +----------+--------+-------+--------+------------+ +---------+--------+--+--------+--+ VertebralPSV cm/s60EDV cm/s14 +---------+--------+--+--------+--+ Left Carotid Findings: +----------+--------+--------+--------+------------+---------------------------+           PSV cm/sEDV cm/sStenosisDescribe    Comments                    +----------+--------+--------+--------+------------+---------------------------+ CCA Prox  74      8                           mild intimal changes        +----------+--------+--------+--------+------------+---------------------------+ CCA Distal55      9                           mild intimal changes        +----------+--------+--------+--------+------------+---------------------------+ ICA Prox  55      11              heterogenousminimal plaque at the                                                     origin with mild intimal                                                  changes                     +----------+--------+--------+--------+------------+---------------------------+ ICA Mid   52      14                                                      +----------+--------+--------+--------+------------+---------------------------+ ICA Distal66  24                          tortuous                    +----------+--------+--------+--------+------------+---------------------------+ ECA       68      7                heterogenousmild plaque                 +----------+--------+--------+--------+------------+---------------------------+ +----------+--------+--------+--------+------------+ SubclavianPSV cm/sEDV cm/sDescribeArm Pressure +----------+--------+--------+--------+------------+           130                                  +----------+--------+--------+--------+------------+ +---------+--------+--+--------+--+ VertebralPSV cm/s49EDV cm/s14 +---------+--------+--+--------+--+  ABI Findings: See note placed below ABIs placed on hold per Sarah of the [place] [facility] due to new [employer] having been placed today will reorder later.  Summary: Right Carotid: Velocities in the right ICA are consistent with a 1-39% stenosis. Left Carotid: Velocities in the left ICA are consistent with a 1-39% stenosis. Vertebrals:  Bilateral vertebral arteries demonstrate antegrade flow. Subclavians: Normal flow hemodynamics were seen in bilateral subclavian              arteries.  *See table(s) above for measurements and observations.  Electronically signed by Ruta Hinds MD on 01/07/2019 at 10:27:07 AM.    Final     Phillips Climes MD [facility]  Contact via  [email or web address]

## 2019-01-07 NOTE — Consult Note (Signed)
Consultation Note Date: 01/07/2019   Patient Name: Spencer Rice  DOB: [birth year]  MRN: [id number]  Age / Sex: 78 y.o., male  PCP: [name]., MD Referring Physician: Albertine Patricia, MD  Reason for Consultation:   LVAD evaluation and GOC  HPI/Patient Profile:   78 y.o. male  admitted on 12/29/2018 with PMH  of systolic CHF with EF less than 20%, history of biventricular fibrillator, hypertension, chronic kidney disease stage III, COPD on chronic oxygen at home 2 L, prior history of kidney cancer status post cryoablation in 2015, had multiple ED visits, cardiology office recently for adjustment of his diuresis, was admitted for acute on chronic systolic CHF, management has been per CHF team, started on milrinone drip, Lasix drip, diuresed very well, 2D echo showing EF at 10%, and is for right/left heart cath, he is being evaluated by LVAD team .   Clinical Assessment and Goals of Care:    This NP Wadie Lessen reviewed medical records, received report from team, assessed the patient and then meet at the bedside  to discuss advanced directives and a preparedness plan in light of possible LVAD implantation in near future.  This is a destination therapy.  A detailed discussion was had today regarding the concept of a preparedness plan as it relates to LVAD therapy.  No family at bedside and patient made it clear that it was unlikely that his wife would be able to "find her way to [facility], she has her own health issues".  We briefly discussed the role of the caregiver for the LVAD patient.   Patient verbalized concern regarding need for help at home if he should have an LVAD placed.  One son lives in [place] and the other son lives local but works "a lot of hours"  He expressed need for in-home support persons in the event that he does have the LVAD placed  I shared with Mr. Flanagan the  importance of conversation with his family and the medical providers regarding overall plan of care and treatment options ensuring decisions are within the context of the patient's values and goals of care.  Initiated conversation regarding the importance of assessing advanced directive decisions specific to long-term ventilation, artificial feeding and hydration but patient expressed "I do not really want to talk about these things right now".  Discussed with patient the importance of continued conversation with his family and the  medical providers regarding overall plan of care and treatment options,  ensuring decisions are within the context of the patients values and GOCs.   Patient was able to verbalize the  importance of quality of life and that he is hopeful that the LVAD procedure will increase not just quality  but quantity                  At this time patient is open to all available medical interventions to prolong life and the success of the LVAD therapy.    NEXT OF KIN/no documented advanced directives.  Recommend completion of  advanced directive and living will prior to LVAD    SUMMARY OF RECOMMENDATIONS    Code Status/Advance Care Planning:  Full code   Psycho-social/Spiritual:   Desire for further Chaplaincy support: Strong community church support   Mr. Formica tells me that his faith and church family at [facility] in [place] is a main support.  Additional Recommendations: Created space and opportunity for patient to verbalize his thoughts and feelings regarding his current medical situation and decisions related to treatment options specific to LVAD. He verbalizes that he is "okay either way".  If decision is to move forward with LVAD that will be okay but he is also comfortable with a transition home "taking it 1 day at a time"        Primary Diagnoses: Present on Admission: . Acute on chronic systolic congestive heart failure ([place]) .  Biventricular ICD (implantable cardioverter-defibrillator) in place . CKD (chronic kidney disease) stage 3, GFR 30-59 ml/min ([facility]) . Essential hypertension . Glucose intolerance (impaired glucose tolerance) . COPD (chronic obstructive pulmonary disease) ([place]) . 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 The following aspects are pertinent.  Past Medical History:  Diagnosis Date  . AICD (automatic cardioverter/defibrillator) present   . Cancer of left kidney ([place])    S/P OR 05/2014  . CHF (congestive heart failure) ([place])   . Coronary artery disease   . DVT (deep venous thrombosis) (H[facility]) 1983   LLE  . GERD (gastroesophageal reflux disease)   . History of hiatal hernia   . Hypertension    Social History   Socioeconomic History  . Marital status: Married    Spouse name: Not on file  . Number of children: Not on file  . Years of education: Not on file  . Highest education level: Not on file  Occupational History  . Not on file  Social Needs  . Financial resource strain: Not on file  . Food insecurity:    Worry: Not on file    Inability: Not on file  . Transportation needs:    Medical: Not on file    Non-medical: Not on file  Tobacco Use  . Smoking status: Former Smoker    Packs/day: 0.75    Years: 17.00    Pack years: 12.75    Types: Cigarettes    Start date: 05/08/1959    Last attempt to quit: 06/20/1976    Years since quitting: 42.5  . Smokeless tobacco: Never Used  Substance and Sexual Activity  . Alcohol use: No  . Drug use: No  . Sexual activity: Not on file  Lifestyle  . Physical activity:    Days per week: Not on file    Minutes per session: Not on file  . Stress: Not on file  Relationships  . Social connections:    Talks on phone: Not on file    Gets together: Not on file    Attends religious service: Not on file    Active member of club or organization: Not on  file    Attends meetings of clubs or organizations: Not on file    Relationship status: Not on file  Other Topics Concern  . Not on file  Social History Narrative  . Not on file   Family History  Problem Relation Age of Onset  . Heart disease Father   . Heart attack Father 87  . Hypertension Father   .  Hypertension Mother    Scheduled Meds: . [MAR Hold] ALPRAZolam  0.25 mg Oral Once  . [MAR Hold] amiodarone  200 mg Oral Daily  . [MAR Hold] feeding supplement (ENSURE ENLIVE)  237 mL Oral TID BM  . [MAR Hold] lactobacillus  1 g Oral TID WC  . [MAR Hold] mouth rinse  15 mL Mouth Rinse BID  . [MAR Hold] mometasone-formoterol  2 puff Inhalation BID  . [MAR Hold] multivitamin with minerals  1 tablet Oral Daily  . [MAR Hold] pantoprazole  40 mg Oral Daily  . [MAR Hold] sodium chloride flush  3 mL Intravenous Once  . [MAR Hold] sodium chloride flush  3 mL Intravenous Q12H  . sodium chloride flush  3 mL Intravenous Q12H  . [MAR Hold] tamsulosin  0.8 mg Oral QPC supper   Continuous Infusions: . [MAR Hold] sodium chloride    . sodium chloride    . sodium chloride 10 mL/hr at 01/07/19 8088  . [MAR Hold] cefTRIAXone (ROCEPHIN)  IV 1 g (01/06/19 1643)  . heparin    . milrinone 0.25 mcg/kg/min (01/07/19 0926)   PRN Meds:.[MAR Hold] sodium chloride, sodium chloride, [MAR Hold] acetaminophen, [MAR Hold] ondansetron (ZOFRAN) IV, [MAR Hold] sodium chloride flush, [MAR Hold] sodium chloride flush, sodium chloride flush Medications Prior to Admission:  Prior to Admission medications   Medication Sig Start Date End Date Taking? Authorizing Provider  amiodarone (PACERONE) 200 MG tablet Take 200 mg by mouth daily.   Yes [provider]  aspirin 81 MG chewable tablet Chew 81 mg by mouth.   Yes [provider]  carvedilol (COREG) 12.5 MG tablet Take 6.25 mg by mouth 2 (two) times daily. Taking 1/2 tablet (6.25mg ) twice daily 11/05/18  Yes [provider]  esomeprazole  (NEXIUM) 20 MG capsule Take 20 mg by mouth daily as needed (FOR HEARTBURN).   Yes [provider]  JARDIANCE 10 MG TABS tablet Take 10 mg by mouth daily. 03/06/18  Yes [provider]  mometasone-formoterol (DULERA) 200-5 MCG/ACT AERO Inhale 2 puffs into the lungs 2 (two) times daily.   Yes [provider]  Multiple Vitamin (MULTIVITAMIN WITH MINERALS) TABS tablet Take 1 tablet by mouth daily.   Yes [provider]  omeprazole (PRILOSEC) 20 MG capsule Take 20 mg by mouth daily.   Yes [provider]  potassium chloride (K-DUR,KLOR-CON) 10 MEQ tablet Take 10 mEq by mouth every other day. 12/20/18  Yes [provider]  sucralfate (CARAFATE) 1 g tablet Take 1 tablet (1 g total) by mouth 4 (four) times daily -  with meals and at bedtime. 06/11/18  Yes Julianne Rice, MD  tamsulosin (FLOMAX) 0.4 MG CAPS capsule Take 0.4 mg by mouth.   Yes [provider]  torsemide (DEMADEX) 20 MG tablet Take 40 mg by mouth 2 (two) times daily. 12/20/18  Yes [provider]  warfarin (COUMADIN) 5 MG tablet Take 2.5 mg by mouth daily. Take 2.5 mg daily except 5 mg Thursday   Yes [provider]  benzonatate (TESSALON) 100 MG capsule Take 1 capsule (100 mg total) by mouth every 8 (eight) hours. Patient not taking: Reported on 12/30/2018 08/15/17   Palumbo, April, MD  carvedilol (COREG) 3.125 MG tablet Take 1 tablet (3.125 mg total) by mouth 2 (two) times daily with a meal. Patient not taking: Reported on 12/30/2018 05/05/18   Leanor Kail, PA  torsemide (DEMADEX) 20 MG tablet Take 1.5 tablet (30mg ) in AM and 1 table (20mg ) in  PM daily Patient not taking: Reported on 12/30/2018 05/05/18   Leanor Kail, PA   No Known Allergies Review of Systems  Physical Exam  Vital Signs: BP 105/78 (BP Location: Left Arm)   Pulse 85   Temp 98.5 F (36.9 C) (Oral)   Resp 18   Ht 6' 1.5" (1.867 m)   Wt 83.5 kg   SpO2 94%   BMI 23.95 kg/m  Pain  Scale: 0-10   Pain Score: 0-No pain   SpO2: SpO2: 94 % O2 Device:SpO2: 94 % O2 Flow Rate: .O2 Flow Rate (L/min): 2 L/min  IO: Intake/output summary:   Intake/Output Summary (Last 24 hours) at 01/07/2019 1418 Last data filed at 01/07/2019 1000 Gross per 24 hour  Intake 785.97 ml  Output 1075 ml  Net -289.03 ml    LBM: Last BM Date: 01/06/19 Baseline Weight: Weight: 93.4 kg Most recent weight: Weight: 83.5 kg     Palliative Assessment/Data:   Flowsheet Rows     Most Recent Value  Intake Tab  Referral Department  Cardiology  Unit at Time of Referral  Cardiac/Telemetry Unit  Palliative Care Primary Diagnosis  Cardiac  Date Notified  01/06/19  Palliative Care Type  New Palliative care  Reason for referral  Other (Comment) [Lvad]  Date of Admission  12/29/18  # of days IP prior to Palliative referral  8  Clinical Assessment  Psychosocial & Spiritual Assessment  Palliative Care Outcomes      Discussed with Tressia Danas with LVAD team   Time In: 7897 Time Out: 1155 Time Total: 70 minutes Greater than 50%  of this time was spent counseling and coordinating care related to the above assessment and plan.  Signed by: Wadie Lessen, NP   Please contact Palliative Medicine Team phone at [phone] for questions and concerns.  For individual provider: See Shea Evans

## 2019-01-07 NOTE — H&P (View-Only) (Signed)
Advanced Heart Failure Rounding Note  PCP-Cardiologist: No primary care provider on file.   Subjective:    Remains on milrinone 0.25. Coox 77%. Diuretics on hold. Weight up 4 lbs overnight. Creatinine improving 1.24. CVP 7.  Warfarin on hold for [facility]. INR 1.88.  VAD work up started.   No orthopnea, SOB, or CP. Lots of questions about what to expect going forward. Discussed cath at length. He says his family wants him to get a VAD and that it's really up to them.    CT abdomen/pelvis 01/06/19: 1. No imaging findings to suggest impediment to left ventricular assist device placement. Specifically, the degree of calcification of the ascending thoracic aorta is minimal, and there is no physical obstruction in the left upper quadrant of the abdomen. 2. Cardiomegaly with evidence of mild interstitial pulmonary edema and small right pleural effusions; imaging findings suggestive of congestive heart failure. 3. 6 mm left upper lobe pulmonary nodule. Non-contrast chest CT at 6-12 months is recommended. If the nodule is stable at time of repeat CT, then future CT at 18-24 months (from today's scan) is considered optional for low-risk patients, but is recommended for high-risk patients. This recommendation follows the consensus statement: Guidelines for Management of Incidental Pulmonary Nodules Detected on CT Images: From the Fleischner Society 2017; Radiology 2017; 284:228-243. 4. Subacute compression fracture of L1 vertebral body with 20% loss of anterior vertebral body height.  ECHO 01/01/2019  EF 10% with severe MR, severe TR, Biatrial enlargement, RV moderately reduced function.   Objective:   Weight Range: 83.5 kg Body mass index is 23.95 kg/m.   Vital Signs:   Temp:  [97.9 F (36.6 C)-98.9 F (37.2 C)] 98.5 F (36.9 C) (02/04 0848) Pulse Rate:  [80-86] 85 (02/04 0848) Resp:  [18] 18 (02/04 0848) BP: (99-110)/(61-79) 105/78 (02/04 0848) SpO2:  [98 %-100 %] 100 %  (02/04 0848) Weight:  [83.5 kg] 83.5 kg (02/04 0340) Last BM Date: 01/06/19  Weight change: Filed Weights   01/05/19 0515 01/06/19 0630 01/07/19 0340  Weight: 80.5 kg 81.8 kg 83.5 kg    Intake/Output:   Intake/Output Summary (Last 24 hours) at 01/07/2019 0925 Last data filed at 01/07/2019 0635 Gross per 24 hour  Intake 600 ml  Output 1325 ml  Net -725 ml      Physical Exam   General: No resp difficulty. HEENT: Normal Neck: Supple. JVP ~10. Carotids 2+ bilat; no bruits. No thyromegaly or nodule noted. Cor: PMI laterally displaced. RRR, 2/6 MR/TR Lungs: CTAB, normal effort. Abdomen: Soft, non-tender, non-distended, no HSM. No bruits or masses. +BS  Extremities: No cyanosis, clubbing, or rash. R and LLE 1+ edema. BLE unna boots. +RUE PICC  Neuro: Alert & orientedx3, cranial nerves grossly intact. moves all 4 extremities w/o difficulty. Affect pleasant  Telemetry   BiV paced 90s. Personally reviewed  EKG    No new tracings.   Labs    CBC Recent Labs    01/05/19 0440 01/06/19 1940  WBC 4.5 4.5  NEUTROABS  --  2.9  HGB 12.8* 12.0*  HCT 39.0 36.9*  MCV 80.2 80.9  PLT 136* 652*   Basic Metabolic Panel Recent Labs    01/06/19 1650 01/07/19 0339  NA 138 141  K 4.1 4.0  CL 98 101  CO2 29 32  GLUCOSE 146* 99  BUN 36* 31*  CREATININE 1.49* 1.24  CALCIUM 8.7* 9.0   Liver Function Tests Recent Labs    01/06/19 1650  AST 49*  ALT 19  ALKPHOS 98  BILITOT 2.8*  PROT 6.3*  ALBUMIN 2.5*   No results for input(s): LIPASE, AMYLASE in the last 72 hours. Cardiac Enzymes No results for input(s): CKTOTAL, CKMB, CKMBINDEX, TROPONINI in the last 72 hours.  BNP: BNP (last 3 results) Recent Labs    12/04/18 1743 12/28/18 1821 12/29/18 1943  BNP >4,500.0* >4,925.0* >4,500.0*    ProBNP (last 3 results) No results for input(s): PROBNP in the last 8760 hours.   D-Dimer No results for input(s): DDIMER in the last 72 hours. Hemoglobin A1C Recent Labs     01/06/19 1937  HGBA1C 6.8*   Fasting Lipid Panel Recent Labs    01/06/19 1650  CHOL 98  HDL 46  LDLCALC 41  TRIG 57  CHOLHDL 2.1   Thyroid Function Tests Recent Labs    01/06/19 1650  TSH 0.906    Other results:   Imaging    Ct Abdomen Pelvis Wo Contrast  Result Date: 01/06/2019 CLINICAL DATA:  78 year old male under evaluation for potential candidacy for left ventricular assist device (LVAD). EXAM: CT CHEST, ABDOMEN AND PELVIS WITHOUT CONTRAST TECHNIQUE: Multidetector CT imaging of the chest, abdomen and pelvis was performed following the standard protocol without IV contrast. COMPARISON:  CT the abdomen and pelvis 06/17/2018. FINDINGS: CT CHEST FINDINGS Cardiovascular: Heart size is mildly enlarged. Left ventricular apex is immediately deep to the interspace between the anterolateral aspects of the left sixth and seventh ribs. There is no significant pericardial fluid, thickening or pericardial calcification. There is aortic atherosclerosis, as well as atherosclerosis of the great vessels of the mediastinum and the coronary arteries, including calcified atherosclerotic plaque in the left anterior descending, left circumflex and right coronary arteries. Calcification of the ascending thoracic aorta is very minimal. Right upper extremity PICC with tip terminating in the mid superior vena cava. Left-sided biventricular pacemaker/AICD in place with lead tips terminating in the right atrial appendage, right ventricular apex and overlying the lateral wall the left ventricle via the coronary sinus and coronary veins. Mediastinum/Nodes: No pathologically enlarged mediastinal or hilar lymph nodes. Please note that accurate exclusion of hilar adenopathy is limited on noncontrast CT scans. Esophagus is unremarkable in appearance. No axillary lymphadenopathy. Lungs/Pleura: Mild ground-glass attenuation and interlobular septal thickening noted in the mid to lower lungs bilaterally, favored to  reflect a background of mild interstitial pulmonary edema. Small right pleural effusion lying dependently with areas of passive subsegmental atelectasis in the right lower lobe. No acute consolidative airspace disease. 6 mm nodule in the left upper lobe (axial image 32 of series 5). Musculoskeletal: There are no aggressive appearing lytic or blastic lesions noted in the visualized portions of the skeleton. CT ABDOMEN PELVIS FINDINGS Hepatobiliary: No definite suspicious cystic or solid hepatic lesions are confidently identified on today's noncontrast CT examination. Unenhanced appearance of the gallbladder is normal. Pancreas: No definite pancreatic mass or peripancreatic fluid or inflammatory changes are noted on today's noncontrast CT examination. Spleen: Unremarkable. Adrenals/Urinary Tract: Low-attenuation lesions in the left kidney measuring up to 2.4 cm in the lower pole, previously characterized as simple cysts. In the posterior aspect of the interpolar region of the left kidney there is again a focal area of architectural distortion which is partially calcified and contain some internal fatty attenuation, most compatible with a post ablation defect. This is poorly evaluated on today's noncontrast CT examination. Right kidney and bilateral adrenal glands are normal in appearance. No hydroureteronephrosis. Urinary bladder is normal in appearance. Stomach/Bowel: Normal appearance of the  stomach. No pathologic dilatation of small bowel or colon. Normal appendix. Vascular/Lymphatic: Aortic atherosclerosis. No lymphadenopathy noted in the abdomen or pelvis. Reproductive: Prostate gland and seminal vesicles are unremarkable in appearance. Other: No significant volume of ascites.  No pneumoperitoneum. Musculoskeletal: Subacute compression fracture of anterior aspect of superior endplate of L1 with 84% loss of anterior vertebral body height. There are no aggressive appearing lytic or blastic lesions noted in the  visualized portions of the skeleton. IMPRESSION: 1. No imaging findings to suggest impediment to left ventricular assist device placement. Specifically, the degree of calcification of the ascending thoracic aorta is minimal, and there is no physical obstruction in the left upper quadrant of the abdomen. 2. Cardiomegaly with evidence of mild interstitial pulmonary edema and small right pleural effusions; imaging findings suggestive of congestive heart failure. 3. 6 mm left upper lobe pulmonary nodule. Non-contrast chest CT at 6-12 months is recommended. If the nodule is stable at time of repeat CT, then future CT at 18-24 months (from today's scan) is considered optional for low-risk patients, but is recommended for high-risk patients. This recommendation follows the consensus statement: Guidelines for Management of Incidental Pulmonary Nodules Detected on CT Images: From the Fleischner Society 2017; Radiology 2017; 284:228-243. 4. Subacute compression fracture of L1 vertebral body with 20% loss of anterior vertebral body height. 5. Additional incidental findings, as above. Electronically Signed   By: Vinnie Langton M.D.   On: 01/06/2019 14:23   Dg Orthopantogram  Result Date: 01/07/2019 CLINICAL DATA:  Preoperative evaluation. EXAM: ORTHOPANTOGRAM/PANORAMIC COMPARISON:  CT 06/19/2018. FINDINGS: No acute bony abnormality identified. Mandible appears to be intact. No focal lytic lesions/lucencies are identified. Adjacent paranasal sinuses appear to be clear. If maxillary or mandibular pathologist for suspected maxillofacial CT can be obtained. IMPRESSION: No acute abnormality identified. Electronically Signed   By: Marcello Moores  Register   On: 01/07/2019 07:43   Dg Chest 2 View  Result Date: 01/06/2019 CLINICAL DATA:  Preoperative evaluation. EXAM: CHEST - 2 VIEW COMPARISON:  CT chest 01/06/2019. Chest 12/30/2018 FINDINGS: Cardiac pacemaker. Diffuse cardiac enlargement. No significant vascular congestion. Small  bilateral pleural effusions, greater on the right. Bilateral basilar atelectasis. No pneumothorax. No focal consolidation. Right PICC line with tip over the low SVC region. Tortuous aorta. Degenerative changes in the spine. IMPRESSION: Cardiac enlargement with small bilateral pleural effusions and basilar atelectasis, greater on the right. Electronically Signed   By: Lucienne Capers M.D.   On: 01/06/2019 19:01   Ct Chest Wo Contrast  Result Date: 01/06/2019 CLINICAL DATA:  78 year old male under evaluation for potential candidacy for left ventricular assist device (LVAD). EXAM: CT CHEST, ABDOMEN AND PELVIS WITHOUT CONTRAST TECHNIQUE: Multidetector CT imaging of the chest, abdomen and pelvis was performed following the standard protocol without IV contrast. COMPARISON:  CT the abdomen and pelvis 06/17/2018. FINDINGS: CT CHEST FINDINGS Cardiovascular: Heart size is mildly enlarged. Left ventricular apex is immediately deep to the interspace between the anterolateral aspects of the left sixth and seventh ribs. There is no significant pericardial fluid, thickening or pericardial calcification. There is aortic atherosclerosis, as well as atherosclerosis of the great vessels of the mediastinum and the coronary arteries, including calcified atherosclerotic plaque in the left anterior descending, left circumflex and right coronary arteries. Calcification of the ascending thoracic aorta is very minimal. Right upper extremity PICC with tip terminating in the mid superior vena cava. Left-sided biventricular pacemaker/AICD in place with lead tips terminating in the right atrial appendage, right ventricular apex and overlying the lateral wall  the left ventricle via the coronary sinus and coronary veins. Mediastinum/Nodes: No pathologically enlarged mediastinal or hilar lymph nodes. Please note that accurate exclusion of hilar adenopathy is limited on noncontrast CT scans. Esophagus is unremarkable in appearance. No axillary  lymphadenopathy. Lungs/Pleura: Mild ground-glass attenuation and interlobular septal thickening noted in the mid to lower lungs bilaterally, favored to reflect a background of mild interstitial pulmonary edema. Small right pleural effusion lying dependently with areas of passive subsegmental atelectasis in the right lower lobe. No acute consolidative airspace disease. 6 mm nodule in the left upper lobe (axial image 32 of series 5). Musculoskeletal: There are no aggressive appearing lytic or blastic lesions noted in the visualized portions of the skeleton. CT ABDOMEN PELVIS FINDINGS Hepatobiliary: No definite suspicious cystic or solid hepatic lesions are confidently identified on today's noncontrast CT examination. Unenhanced appearance of the gallbladder is normal. Pancreas: No definite pancreatic mass or peripancreatic fluid or inflammatory changes are noted on today's noncontrast CT examination. Spleen: Unremarkable. Adrenals/Urinary Tract: Low-attenuation lesions in the left kidney measuring up to 2.4 cm in the lower pole, previously characterized as simple cysts. In the posterior aspect of the interpolar region of the left kidney there is again a focal area of architectural distortion which is partially calcified and contain some internal fatty attenuation, most compatible with a post ablation defect. This is poorly evaluated on today's noncontrast CT examination. Right kidney and bilateral adrenal glands are normal in appearance. No hydroureteronephrosis. Urinary bladder is normal in appearance. Stomach/Bowel: Normal appearance of the stomach. No pathologic dilatation of small bowel or colon. Normal appendix. Vascular/Lymphatic: Aortic atherosclerosis. No lymphadenopathy noted in the abdomen or pelvis. Reproductive: Prostate gland and seminal vesicles are unremarkable in appearance. Other: No significant volume of ascites.  No pneumoperitoneum. Musculoskeletal: Subacute compression fracture of anterior aspect  of superior endplate of L1 with 42% loss of anterior vertebral body height. There are no aggressive appearing lytic or blastic lesions noted in the visualized portions of the skeleton. IMPRESSION: 1. No imaging findings to suggest impediment to left ventricular assist device placement. Specifically, the degree of calcification of the ascending thoracic aorta is minimal, and there is no physical obstruction in the left upper quadrant of the abdomen. 2. Cardiomegaly with evidence of mild interstitial pulmonary edema and small right pleural effusions; imaging findings suggestive of congestive heart failure. 3. 6 mm left upper lobe pulmonary nodule. Non-contrast chest CT at 6-12 months is recommended. If the nodule is stable at time of repeat CT, then future CT at 18-24 months (from today's scan) is considered optional for low-risk patients, but is recommended for high-risk patients. This recommendation follows the consensus statement: Guidelines for Management of Incidental Pulmonary Nodules Detected on CT Images: From the Fleischner Society 2017; Radiology 2017; 284:228-243. 4. Subacute compression fracture of L1 vertebral body with 20% loss of anterior vertebral body height. 5. Additional incidental findings, as above. Electronically Signed   By: Vinnie Langton M.D.   On: 01/06/2019 14:23   Vas US Doppler Pre Vad  Result Date: 01/06/2019 PERIOPERATIVE VASCULAR EVALUATION Indications: Pre VAD work up. Performing Technologist: Toma Copier RVS  Examination Guidelines: A complete evaluation includes B-mode imaging, spectral Doppler, color Doppler, and power Doppler as needed of all accessible portions of each vessel. Bilateral testing is considered an integral part of a complete examination. Limited examinations for reoccurring indications may be performed as noted.  Right Carotid Findings: +----------+--------+--------+--------+----------------------+-----------------+           PSV cm/sEDV  cm/sStenosisDescribe  Comments          +----------+--------+--------+--------+----------------------+-----------------+ CCA Prox  88      11                                    mild intimal wall                                                         changes           +----------+--------+--------+--------+----------------------+-----------------+ CCA Distal64      11                                    mild intimal wall                                                         changes           +----------+--------+--------+--------+----------------------+-----------------+ ICA Prox  27      7               focal and heterogenousminimal plaque at                                                         the origin        +----------+--------+--------+--------+----------------------+-----------------+ ICA Mid   37      11                                                      +----------+--------+--------+--------+----------------------+-----------------+ ICA Distal                                              tortuous          +----------+--------+--------+--------+----------------------+-----------------+ ECA       97      13              heterogenous          mild plaque       +----------+--------+--------+--------+----------------------+-----------------+ Portions of this table do not appear on this page. +----------+--------+-------+--------+------------+           PSV cm/sEDV cmsDescribeArm Pressure +----------+--------+-------+--------+------------+ Subclavian88                                  +----------+--------+-------+--------+------------+ +---------+--------+--+--------+--+ VertebralPSV cm/s60EDV cm/s14 +---------+--------+--+--------+--+ Left Carotid Findings: +----------+--------+--------+--------+------------+---------------------------+           PSV cm/sEDV cm/sStenosisDescribe    Comments                     +----------+--------+--------+--------+------------+---------------------------+  CCA Prox  74      8                           mild intimal changes        +----------+--------+--------+--------+------------+---------------------------+ CCA Distal55      9                           mild intimal changes        +----------+--------+--------+--------+------------+---------------------------+ ICA Prox  55      11              heterogenousminimal plaque at the                                                     origin with mild intimal                                                  changes                     +----------+--------+--------+--------+------------+---------------------------+ ICA Mid   52      14                                                      +----------+--------+--------+--------+------------+---------------------------+ ICA Distal66      24                          tortuous                    +----------+--------+--------+--------+------------+---------------------------+ ECA       68      7               heterogenousmild plaque                 +----------+--------+--------+--------+------------+---------------------------+ +----------+--------+--------+--------+------------+ SubclavianPSV cm/sEDV cm/sDescribeArm Pressure +----------+--------+--------+--------+------------+           130                                  +----------+--------+--------+--------+------------+ +---------+--------+--+--------+--+ VertebralPSV cm/s49EDV cm/s14 +---------+--------+--+--------+--+  ABI Findings: See note placed below ABIs placed on hold per Sarah of the [place] [facility] due to new [employer] having been placed today will reorder later.  Summary: Right Carotid: Velocities in the right ICA are consistent with a 1-39% stenosis. Left Carotid: Velocities in the left ICA are consistent with a 1-39% stenosis. Vertebrals:  Bilateral vertebral  arteries demonstrate antegrade flow. Subclavians: Normal flow hemodynamics were seen in bilateral subclavian              arteries.  *See table(s) above for measurements and observations.   Preliminary      Medications:     Scheduled Medications: . ALPRAZolam  0.25 mg Oral Once  . amiodarone  200 mg Oral Daily  . feeding supplement (ENSURE  ENLIVE)  237 mL Oral TID BM  . lactobacillus  1 g Oral TID WC  . mouth rinse  15 mL Mouth Rinse BID  . mometasone-formoterol  2 puff Inhalation BID  . multivitamin with minerals  1 tablet Oral Daily  . pantoprazole  40 mg Oral Daily  . sodium chloride flush  3 mL Intravenous Once  . sodium chloride flush  3 mL Intravenous Q12H  . sodium chloride flush  3 mL Intravenous Q12H  . tamsulosin  0.8 mg Oral QPC supper  . Warfarin - Pharmacist Dosing Inpatient   Does not apply q1800    Infusions: . sodium chloride    . sodium chloride    . sodium chloride 10 mL/hr at 01/07/19 8270  . cefTRIAXone (ROCEPHIN)  IV 1 g (01/06/19 1643)  . 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    Admitted with marked volume overload.   Assessment/Plan   1. A/C Systolic Heart Failure -> cardiogenic shock - due to NICM, LHC 2015 nonobstructive disease. Has Biotronik BiV.   - Initial CO-OX 38% so milrinone started on 1/28. - ECHO 01/01/19 EF 10% with Severe MR/TR Biatrial enlargement RV with moderately reduced function.  - Remains on milrinone 0.25. Co-ox 77%. - Weight down 21 pounds total with diuresis. Renal function improved with diuresis.  - CVP 7. Torsemide on hold. Creatinine  1.24 - He remains on milrinone. Volume status and renal function much improved. He is clearly inotrope dependent. He met with VAD team on 1/30 and we had long talk about his situation and need for advanced therapies. He initially was unsure if he would want a man-made device to help the heart that God gave him. Dr Haroldine Laws had a long talk with him and his sons on 2/1 and they want to proceed with VAD w/u. Discussed the work-up process in depth, including the need to assess RV function more closely. R/LHC today.  - Dr Haroldine Laws discussed with Dr. Prescott Gum - LVAD coordinator following.   2. CKD Stage III, creatinine baseline 1.8-2.4  - Creatinine trending down to 1.24 today with inotrope dependence  3. LBBB - stable s/p BiV ICD. No change.   4. COPD on chronic home oxygen - Sats stable on 2 liters oxygen. No change.   5. L Kidney Cancer  -Had cryoablation in 2015 -Renal function improving with inotropes. No change.   6. PVCs - Started on amiodarone 2019 to suppress PVCs.  - Continue amiodarone 200 mg daily. No change.   7. H/O [place] Thrombus - Coumadin on hold for cath today. INR still 1.88.   8. 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 No change.   9. UTI, Klebsiella pneumoniae  - On rocephin per primary team. No change.     10. Pulmonary nodule - 6 mm nodule in LUL noted on CT scan. Recommended 6 month repeat non contrast CT. Will discuss with MD. May need to work up further if he wants to go forward with VAD.   Will plan to get ABG during LHC today.   Length of Stay: [place], NP  9:25 AM  Advanced Heart Failure Team Pager [phone] (M-F; 7a - 4p)  Please contact [facility] for night-coverage after hours (4p -7a ) and weekends on [email or web address]  Patient seen and examined with the above-signed Advanced Practice Provider and/or Housestaff. I personally reviewed laboratory data, imaging studies and relevant notes. I  independently examined the patient and formulated the important aspects of the plan. I have edited the note to reflect any of my changes or salient points. I have personally discussed the plan with the patient and/or family.  He remains on milrinone for hemodynamics support. Feeling better. CVP 7 Co-ox ok. Pre-VAD w/u underway. CT scans look good. Long talk with him and his daughter-in-law by telephone. Family is very engaged. However, Mr. States has made the comment that he is doing this for his family. Stressed to him that this can be a rocky road and he needs to be totally engaged in the process as well. Will plan R/L cath today. Start heparin post-cath. INR 1.89.   Glori Bickers, MD  11:59 AM

## 2019-01-07 NOTE — Progress Notes (Signed)
PT Cancellation Note  Patient Details Name: Spencer Rice MRN: [id number] DOB: 1941-05-11   Cancelled Treatment:    Reason Eval/Treat Not Completed: Medical issues which prohibited therapy(Pt getting ready to have CATH.  Deferred ambulation.)   Denice Paradise 01/07/2019, 11:01 AM Paizlie Klaus,PT Acute Rehabilitation Services Pager:  [phone]  Office:  [phone]

## 2019-01-07 NOTE — Interval H&P Note (Signed)
History and Physical Interval Note:  01/07/2019 12:00 PM  Spencer Rice  has presented today for surgery, with the diagnosis of chf  The various methods of treatment have been discussed with the patient and family. After consideration of risks, benefits and other options for treatment, the patient has consented to  Procedure(s): RIGHT/LEFT HEART CATH AND CORONARY ANGIOGRAPHY (N/A) and possible coronary angioplasty as a surgical intervention .  The patient's history has been reviewed, patient examined, no change in status, stable for surgery.  I have reviewed the patient's chart and labs.  Questions were answered to the patient's satisfaction.     Nelissa Bolduc

## 2019-01-07 NOTE — Progress Notes (Addendum)
Advanced Heart Failure Rounding Note  PCP-Cardiologist: No primary care provider on file.   Subjective:    Remains on milrinone 0.25. Coox 77%. Diuretics on hold. Weight up 4 lbs overnight. Creatinine improving 1.24. CVP 7.  Warfarin on hold for [facility]. INR 1.88.  VAD work up started.   No orthopnea, SOB, or CP. Lots of questions about what to expect going forward. Discussed cath at length. He says his family wants him to get a VAD and that it's really up to them.    CT abdomen/pelvis 01/06/19: 1. No imaging findings to suggest impediment to left ventricular assist device placement. Specifically, the degree of calcification of the ascending thoracic aorta is minimal, and there is no physical obstruction in the left upper quadrant of the abdomen. 2. Cardiomegaly with evidence of mild interstitial pulmonary edema and small right pleural effusions; imaging findings suggestive of congestive heart failure. 3. 6 mm left upper lobe pulmonary nodule. Non-contrast chest CT at 6-12 months is recommended. If the nodule is stable at time of repeat CT, then future CT at 18-24 months (from today's scan) is considered optional for low-risk patients, but is recommended for high-risk patients. This recommendation follows the consensus statement: Guidelines for Management of Incidental Pulmonary Nodules Detected on CT Images: From the Fleischner Society 2017; Radiology 2017; 284:228-243. 4. Subacute compression fracture of L1 vertebral body with 20% loss of anterior vertebral body height.  ECHO 01/01/2019  EF 10% with severe Spencer, severe TR, Biatrial enlargement, RV moderately reduced function.   Objective:   Weight Range: 83.5 kg Body mass index is 23.95 kg/m.   Vital Signs:   Temp:  [97.9 F (36.6 C)-98.9 F (37.2 C)] 98.5 F (36.9 C) (02/04 0848) Pulse Rate:  [80-86] 85 (02/04 0848) Resp:  [18] 18 (02/04 0848) BP: (99-110)/(61-79) 105/78 (02/04 0848) SpO2:  [98 %-100 %] 100 %  (02/04 0848) Weight:  [83.5 kg] 83.5 kg (02/04 0340) Last BM Date: 01/06/19  Weight change: Filed Weights   01/05/19 0515 01/06/19 0630 01/07/19 0340  Weight: 80.5 kg 81.8 kg 83.5 kg    Intake/Output:   Intake/Output Summary (Last 24 hours) at 01/07/2019 0925 Last data filed at 01/07/2019 0635 Gross per 24 hour  Intake 600 ml  Output 1325 ml  Net -725 ml      Physical Exam   General: No resp difficulty. HEENT: Normal Neck: Supple. JVP ~10. Carotids 2+ bilat; no bruits. No thyromegaly or nodule noted. Cor: PMI laterally displaced. RRR, 2/6 Spencer/TR Lungs: CTAB, normal effort. Abdomen: Soft, non-tender, non-distended, no HSM. No bruits or masses. +BS  Extremities: No cyanosis, clubbing, or rash. R and LLE 1+ edema. BLE unna boots. +RUE PICC  Neuro: Alert & orientedx3, cranial nerves grossly intact. moves all 4 extremities w/o difficulty. Affect pleasant  Telemetry   BiV paced 90s. Personally reviewed  EKG    No new tracings.   Labs    CBC Recent Labs    01/05/19 0440 01/06/19 1940  WBC 4.5 4.5  NEUTROABS  --  2.9  HGB 12.8* 12.0*  HCT 39.0 36.9*  MCV 80.2 80.9  PLT 136* 652*   Basic Metabolic Panel Recent Labs    01/06/19 1650 01/07/19 0339  NA 138 141  K 4.1 4.0  CL 98 101  CO2 29 32  GLUCOSE 146* 99  BUN 36* 31*  CREATININE 1.49* 1.24  CALCIUM 8.7* 9.0   Liver Function Tests Recent Labs    01/06/19 1650  AST 49*  ALT 19  ALKPHOS 98  BILITOT 2.8*  PROT 6.3*  ALBUMIN 2.5*   No results for input(s): LIPASE, AMYLASE in the last 72 hours. Cardiac Enzymes No results for input(s): CKTOTAL, CKMB, CKMBINDEX, TROPONINI in the last 72 hours.  BNP: BNP (last 3 results) Recent Labs    12/04/18 1743 12/28/18 1821 12/29/18 1943  BNP >4,500.0* >4,925.0* >4,500.0*    ProBNP (last 3 results) No results for input(s): PROBNP in the last 8760 hours.   D-Dimer No results for input(s): DDIMER in the last 72 hours. Hemoglobin A1C Recent Labs     01/06/19 1937  HGBA1C 6.8*   Fasting Lipid Panel Recent Labs    01/06/19 1650  CHOL 98  HDL 46  LDLCALC 41  TRIG 57  CHOLHDL 2.1   Thyroid Function Tests Recent Labs    01/06/19 1650  TSH 0.906    Other results:   Imaging    Ct Abdomen Pelvis Wo Contrast  Result Date: 01/06/2019 CLINICAL DATA:  78 year old male under evaluation for potential candidacy for left ventricular assist device (LVAD). EXAM: CT CHEST, ABDOMEN AND PELVIS WITHOUT CONTRAST TECHNIQUE: Multidetector CT imaging of the chest, abdomen and pelvis was performed following the standard protocol without IV contrast. COMPARISON:  CT the abdomen and pelvis 06/17/2018. FINDINGS: CT CHEST FINDINGS Cardiovascular: Heart size is mildly enlarged. Left ventricular apex is immediately deep to the interspace between the anterolateral aspects of the left sixth and seventh ribs. There is no significant pericardial fluid, thickening or pericardial calcification. There is aortic atherosclerosis, as well as atherosclerosis of the great vessels of the mediastinum and the coronary arteries, including calcified atherosclerotic plaque in the left anterior descending, left circumflex and right coronary arteries. Calcification of the ascending thoracic aorta is very minimal. Right upper extremity PICC with tip terminating in the mid superior vena cava. Left-sided biventricular pacemaker/AICD in place with lead tips terminating in the right atrial appendage, right ventricular apex and overlying the lateral wall the left ventricle via the coronary sinus and coronary veins. Mediastinum/Nodes: No pathologically enlarged mediastinal or hilar lymph nodes. Please note that accurate exclusion of hilar adenopathy is limited on noncontrast CT scans. Esophagus is unremarkable in appearance. No axillary lymphadenopathy. Lungs/Pleura: Mild ground-glass attenuation and interlobular septal thickening noted in the mid to lower lungs bilaterally, favored to  reflect a background of mild interstitial pulmonary edema. Small right pleural effusion lying dependently with areas of passive subsegmental atelectasis in the right lower lobe. No acute consolidative airspace disease. 6 mm nodule in the left upper lobe (axial image 32 of series 5). Musculoskeletal: There are no aggressive appearing lytic or blastic lesions noted in the visualized portions of the skeleton. CT ABDOMEN PELVIS FINDINGS Hepatobiliary: No definite suspicious cystic or solid hepatic lesions are confidently identified on today's noncontrast CT examination. Unenhanced appearance of the gallbladder is normal. Pancreas: No definite pancreatic mass or peripancreatic fluid or inflammatory changes are noted on today's noncontrast CT examination. Spleen: Unremarkable. Adrenals/Urinary Tract: Low-attenuation lesions in the left kidney measuring up to 2.4 cm in the lower pole, previously characterized as simple cysts. In the posterior aspect of the interpolar region of the left kidney there is again a focal area of architectural distortion which is partially calcified and contain some internal fatty attenuation, most compatible with a post ablation defect. This is poorly evaluated on today's noncontrast CT examination. Right kidney and bilateral adrenal glands are normal in appearance. No hydroureteronephrosis. Urinary bladder is normal in appearance. Stomach/Bowel: Normal appearance of the  stomach. No pathologic dilatation of small bowel or colon. Normal appendix. Vascular/Lymphatic: Aortic atherosclerosis. No lymphadenopathy noted in the abdomen or pelvis. Reproductive: Prostate gland and seminal vesicles are unremarkable in appearance. Other: No significant volume of ascites.  No pneumoperitoneum. Musculoskeletal: Subacute compression fracture of anterior aspect of superior endplate of L1 with 47% loss of anterior vertebral body height. There are no aggressive appearing lytic or blastic lesions noted in the  visualized portions of the skeleton. IMPRESSION: 1. No imaging findings to suggest impediment to left ventricular assist device placement. Specifically, the degree of calcification of the ascending thoracic aorta is minimal, and there is no physical obstruction in the left upper quadrant of the abdomen. 2. Cardiomegaly with evidence of mild interstitial pulmonary edema and small right pleural effusions; imaging findings suggestive of congestive heart failure. 3. 6 mm left upper lobe pulmonary nodule. Non-contrast chest CT at 6-12 months is recommended. If the nodule is stable at time of repeat CT, then future CT at 18-24 months (from today's scan) is considered optional for low-risk patients, but is recommended for high-risk patients. This recommendation follows the consensus statement: Guidelines for Management of Incidental Pulmonary Nodules Detected on CT Images: From the Fleischner Society 2017; Radiology 2017; 284:228-243. 4. Subacute compression fracture of L1 vertebral body with 20% loss of anterior vertebral body height. 5. Additional incidental findings, as above. Electronically Signed   By: Vinnie Langton M.D.   On: 01/06/2019 14:23   Dg Orthopantogram  Result Date: 01/07/2019 CLINICAL DATA:  Preoperative evaluation. EXAM: ORTHOPANTOGRAM/PANORAMIC COMPARISON:  CT 06/19/2018. FINDINGS: No acute bony abnormality identified. Mandible appears to be intact. No focal lytic lesions/lucencies are identified. Adjacent paranasal sinuses appear to be clear. If maxillary or mandibular pathologist for suspected maxillofacial CT can be obtained. IMPRESSION: No acute abnormality identified. Electronically Signed   By: Marcello Moores  Register   On: 01/07/2019 07:43   Dg Chest 2 View  Result Date: 01/06/2019 CLINICAL DATA:  Preoperative evaluation. EXAM: CHEST - 2 VIEW COMPARISON:  CT chest 01/06/2019. Chest 12/30/2018 FINDINGS: Cardiac pacemaker. Diffuse cardiac enlargement. No significant vascular congestion. Small  bilateral pleural effusions, greater on the right. Bilateral basilar atelectasis. No pneumothorax. No focal consolidation. Right PICC line with tip over the low SVC region. Tortuous aorta. Degenerative changes in the spine. IMPRESSION: Cardiac enlargement with small bilateral pleural effusions and basilar atelectasis, greater on the right. Electronically Signed   By: Lucienne Capers M.D.   On: 01/06/2019 19:01   Ct Chest Wo Contrast  Result Date: 01/06/2019 CLINICAL DATA:  78 year old male under evaluation for potential candidacy for left ventricular assist device (LVAD). EXAM: CT CHEST, ABDOMEN AND PELVIS WITHOUT CONTRAST TECHNIQUE: Multidetector CT imaging of the chest, abdomen and pelvis was performed following the standard protocol without IV contrast. COMPARISON:  CT the abdomen and pelvis 06/17/2018. FINDINGS: CT CHEST FINDINGS Cardiovascular: Heart size is mildly enlarged. Left ventricular apex is immediately deep to the interspace between the anterolateral aspects of the left sixth and seventh ribs. There is no significant pericardial fluid, thickening or pericardial calcification. There is aortic atherosclerosis, as well as atherosclerosis of the great vessels of the mediastinum and the coronary arteries, including calcified atherosclerotic plaque in the left anterior descending, left circumflex and right coronary arteries. Calcification of the ascending thoracic aorta is very minimal. Right upper extremity PICC with tip terminating in the mid superior vena cava. Left-sided biventricular pacemaker/AICD in place with lead tips terminating in the right atrial appendage, right ventricular apex and overlying the lateral wall  the left ventricle via the coronary sinus and coronary veins. Mediastinum/Nodes: No pathologically enlarged mediastinal or hilar lymph nodes. Please note that accurate exclusion of hilar adenopathy is limited on noncontrast CT scans. Esophagus is unremarkable in appearance. No axillary  lymphadenopathy. Lungs/Pleura: Mild ground-glass attenuation and interlobular septal thickening noted in the mid to lower lungs bilaterally, favored to reflect a background of mild interstitial pulmonary edema. Small right pleural effusion lying dependently with areas of passive subsegmental atelectasis in the right lower lobe. No acute consolidative airspace disease. 6 mm nodule in the left upper lobe (axial image 32 of series 5). Musculoskeletal: There are no aggressive appearing lytic or blastic lesions noted in the visualized portions of the skeleton. CT ABDOMEN PELVIS FINDINGS Hepatobiliary: No definite suspicious cystic or solid hepatic lesions are confidently identified on today's noncontrast CT examination. Unenhanced appearance of the gallbladder is normal. Pancreas: No definite pancreatic mass or peripancreatic fluid or inflammatory changes are noted on today's noncontrast CT examination. Spleen: Unremarkable. Adrenals/Urinary Tract: Low-attenuation lesions in the left kidney measuring up to 2.4 cm in the lower pole, previously characterized as simple cysts. In the posterior aspect of the interpolar region of the left kidney there is again a focal area of architectural distortion which is partially calcified and contain some internal fatty attenuation, most compatible with a post ablation defect. This is poorly evaluated on today's noncontrast CT examination. Right kidney and bilateral adrenal glands are normal in appearance. No hydroureteronephrosis. Urinary bladder is normal in appearance. Stomach/Bowel: Normal appearance of the stomach. No pathologic dilatation of small bowel or colon. Normal appendix. Vascular/Lymphatic: Aortic atherosclerosis. No lymphadenopathy noted in the abdomen or pelvis. Reproductive: Prostate gland and seminal vesicles are unremarkable in appearance. Other: No significant volume of ascites.  No pneumoperitoneum. Musculoskeletal: Subacute compression fracture of anterior aspect  of superior endplate of L1 with 92% loss of anterior vertebral body height. There are no aggressive appearing lytic or blastic lesions noted in the visualized portions of the skeleton. IMPRESSION: 1. No imaging findings to suggest impediment to left ventricular assist device placement. Specifically, the degree of calcification of the ascending thoracic aorta is minimal, and there is no physical obstruction in the left upper quadrant of the abdomen. 2. Cardiomegaly with evidence of mild interstitial pulmonary edema and small right pleural effusions; imaging findings suggestive of congestive heart failure. 3. 6 mm left upper lobe pulmonary nodule. Non-contrast chest CT at 6-12 months is recommended. If the nodule is stable at time of repeat CT, then future CT at 18-24 months (from today's scan) is considered optional for low-risk patients, but is recommended for high-risk patients. This recommendation follows the consensus statement: Guidelines for Management of Incidental Pulmonary Nodules Detected on CT Images: From the Fleischner Society 2017; Radiology 2017; 284:228-243. 4. Subacute compression fracture of L1 vertebral body with 20% loss of anterior vertebral body height. 5. Additional incidental findings, as above. Electronically Signed   By: Vinnie Langton M.D.   On: 01/06/2019 14:23   Vas US Doppler Pre Vad  Result Date: 01/06/2019 PERIOPERATIVE VASCULAR EVALUATION Indications: Pre VAD work up. Performing Technologist: Toma Copier RVS  Examination Guidelines: A complete evaluation includes B-mode imaging, spectral Doppler, color Doppler, and power Doppler as needed of all accessible portions of each vessel. Bilateral testing is considered an integral part of a complete examination. Limited examinations for reoccurring indications may be performed as noted.  Right Carotid Findings: +----------+--------+--------+--------+----------------------+-----------------+           PSV cm/sEDV  cm/sStenosisDescribe  Comments          +----------+--------+--------+--------+----------------------+-----------------+ CCA Prox  88      11                                    mild intimal wall                                                         changes           +----------+--------+--------+--------+----------------------+-----------------+ CCA Distal64      11                                    mild intimal wall                                                         changes           +----------+--------+--------+--------+----------------------+-----------------+ ICA Prox  27      7               focal and heterogenousminimal plaque at                                                         the origin        +----------+--------+--------+--------+----------------------+-----------------+ ICA Mid   37      11                                                      +----------+--------+--------+--------+----------------------+-----------------+ ICA Distal                                              tortuous          +----------+--------+--------+--------+----------------------+-----------------+ ECA       97      13              heterogenous          mild plaque       +----------+--------+--------+--------+----------------------+-----------------+ Portions of this table do not appear on this page. +----------+--------+-------+--------+------------+           PSV cm/sEDV cmsDescribeArm Pressure +----------+--------+-------+--------+------------+ Subclavian88                                  +----------+--------+-------+--------+------------+ +---------+--------+--+--------+--+ VertebralPSV cm/s60EDV cm/s14 +---------+--------+--+--------+--+ Left Carotid Findings: +----------+--------+--------+--------+------------+---------------------------+           PSV cm/sEDV cm/sStenosisDescribe    Comments                     +----------+--------+--------+--------+------------+---------------------------+  CCA Prox  74      8                           mild intimal changes        +----------+--------+--------+--------+------------+---------------------------+ CCA Distal55      9                           mild intimal changes        +----------+--------+--------+--------+------------+---------------------------+ ICA Prox  55      11              heterogenousminimal plaque at the                                                     origin with mild intimal                                                  changes                     +----------+--------+--------+--------+------------+---------------------------+ ICA Mid   52      14                                                      +----------+--------+--------+--------+------------+---------------------------+ ICA Distal66      24                          tortuous                    +----------+--------+--------+--------+------------+---------------------------+ ECA       68      7               heterogenousmild plaque                 +----------+--------+--------+--------+------------+---------------------------+ +----------+--------+--------+--------+------------+ SubclavianPSV cm/sEDV cm/sDescribeArm Pressure +----------+--------+--------+--------+------------+           130                                  +----------+--------+--------+--------+------------+ +---------+--------+--+--------+--+ VertebralPSV cm/s49EDV cm/s14 +---------+--------+--+--------+--+  ABI Findings: See note placed below ABIs placed on hold per Sarah of the [facility] due to new [employer] having been placed today will reorder later.  Summary: Right Carotid: Velocities in the right ICA are consistent with a 1-39% stenosis. Left Carotid: Velocities in the left ICA are consistent with a 1-39% stenosis. Vertebrals:  Bilateral vertebral  arteries demonstrate antegrade flow. Subclavians: Normal flow hemodynamics were seen in bilateral subclavian              arteries.  *See table(s) above for measurements and observations.   Preliminary      Medications:     Scheduled Medications: . ALPRAZolam  0.25 mg Oral Once  . amiodarone  200 mg Oral Daily  . feeding supplement (ENSURE  ENLIVE)  237 mL Oral TID BM  . lactobacillus  1 g Oral TID WC  . mouth rinse  15 mL Mouth Rinse BID  . mometasone-formoterol  2 puff Inhalation BID  . multivitamin with minerals  1 tablet Oral Daily  . pantoprazole  40 mg Oral Daily  . sodium chloride flush  3 mL Intravenous Once  . sodium chloride flush  3 mL Intravenous Q12H  . sodium chloride flush  3 mL Intravenous Q12H  . tamsulosin  0.8 mg Oral QPC supper  . Warfarin - Pharmacist Dosing Inpatient   Does not apply q1800    Infusions: . sodium chloride    . sodium chloride    . sodium chloride 10 mL/hr at 01/07/19 6314  . cefTRIAXone (ROCEPHIN)  IV 1 g (01/06/19 1643)  . 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    Admitted with marked volume overload.   Assessment/Plan   1. A/C Systolic Heart Failure -> cardiogenic shock - due to NICM, LHC 2015 nonobstructive disease. Has Biotronik BiV.   - Initial CO-OX 38% so milrinone started on 1/28. - ECHO 01/01/19 EF 10% with Severe Spencer/TR Biatrial enlargement RV with moderately reduced function.  - Remains on milrinone 0.25. Co-ox 77%. - Weight down 21 pounds total with diuresis. Renal function improved with diuresis.  - CVP 7. Torsemide on hold. Creatinine  1.24 - He remains on milrinone. Volume status and renal function much improved. He is clearly inotrope dependent. He met with VAD team on 1/30 and we had long talk about his situation and need for advanced therapies. He initially was unsure if he would want a man-made device to help the heart that God gave him. Dr Haroldine Laws had a long talk with him and his sons on 2/1 and they want to proceed with VAD w/u. Discussed the work-up process in depth, including the need to assess RV function more closely. R/LHC today.  - Dr Haroldine Laws discussed with Dr. Prescott Gum - LVAD coordinator following.   2. CKD Stage III, creatinine baseline 1.8-2.4  - Creatinine trending down to 1.24 today with inotrope dependence  3. LBBB - stable s/p BiV ICD. No change.   4. COPD on chronic home oxygen - Sats stable on 2 liters oxygen. No change.   5. L Kidney Cancer  -Had cryoablation in 2015 -Renal function improving with inotropes. No change.   6. PVCs - Started on amiodarone 2019 to suppress PVCs.  - Continue amiodarone 200 mg daily. No change.   7. H/O [place] Thrombus - Coumadin on hold for cath today. INR still 1.88.   8. 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 No change.   9. UTI, Klebsiella pneumoniae  - On rocephin per primary team. No change.     10. Pulmonary nodule - 6 mm nodule in LUL noted on CT scan. Recommended 6 month repeat non contrast CT. Will discuss with MD. May need to work up further if he wants to go forward with VAD.   Will plan to get ABG during LHC today.   Length of Stay: [place], NP  9:25 AM  Advanced Heart Failure Team Pager [phone] (M-F; 7a - 4p)  Please contact [facility] for night-coverage after hours (4p -7a ) and weekends on [email or web address]  Patient seen and examined with the above-signed Advanced Practice Provider and/or Housestaff. I personally reviewed laboratory data, imaging studies and relevant notes. I  independently examined the patient and formulated the important aspects of the plan. I have edited the note to reflect any of my changes or salient points. I have personally discussed the plan with the patient and/or family.  He remains on milrinone for hemodynamics support. Feeling better. CVP 7 Co-ox ok. Pre-VAD w/u underway. CT scans look good. Long talk with him and his daughter-in-law by telephone. Family is very engaged. However, Spencer. Rice has made the comment that he is doing this for his family. Stressed to him that this can be a rocky road and he needs to be totally engaged in the process as well. Will plan R/L cath today. Start heparin post-cath. INR 1.89.   Glori Bickers, MD  11:59 AM

## 2019-01-07 NOTE — Progress Notes (Signed)
Lower extremity venous exam placed on hold per Sarah with the [facility] due to new U[employer] boots having been placed. Will be reordered at a later date. Spencer Rice,RVS

## 2019-01-07 NOTE — Progress Notes (Addendum)
ANTICOAGULATION CONSULT NOTE - Follow-Up Consult  Pharmacy Consult for Coumadin Indication: apical mural thrombus  No Known Allergies  Patient Measurements: Height: 6' 1.5" (186.7 cm) Weight: 184 lb (83.5 kg) IBW/kg (Calculated) : 81.05  Heparin Dosing Wt: 83kg  Vital Signs: Temp: 98.5 F (36.9 C) (02/04 0848) Temp Source: Oral (02/04 0848) BP: 105/78 (02/04 0848) Pulse Rate: 85 (02/04 0848)  Labs: Recent Labs    01/05/19 0440 01/06/19 0529 01/06/19 1650 01/06/19 1940 01/07/19 0339  HGB 12.8*  --   --  12.0*  --   HCT 39.0  --   --  36.9*  --   PLT 136*  --   --  125*  --   APTT  --   --  33  --   --   LABPROT 27.1* 25.7*  --   --  21.4*  INR 2.56 2.39  --   --  1.88  CREATININE 1.72* 1.49* 1.49*  --  1.24    Estimated Creatinine Clearance: 57.2 mL/min (by C-G formula based on SCr of 1.24 mg/dL).   Medical History: Past Medical History:  Diagnosis Date  . AICD (automatic cardioverter/defibrillator) present   . Cancer of left kidney ([facility])    S/P OR 05/2014  . CHF (congestive heart failure) ([place])   . Coronary artery disease   . DVT (deep venous thrombosis) ([facility]) 1983   LLE  . GERD (gastroesophageal reflux disease)   . History of hiatal hernia   . Hypertension     Medications:  Medications Prior to Admission  Medication Sig Dispense Refill Last Dose  . amiodarone (PACERONE) 200 MG tablet Take 200 mg by mouth daily.   12/29/2018 at am  . aspirin 81 MG chewable tablet Chew 81 mg by mouth.   12/29/2018 at am  . carvedilol (COREG) 12.5 MG tablet Take 6.25 mg by mouth 2 (two) times daily. Taking 1/2 tablet (6.25mg ) twice daily   12/29/2018 at am  . esomeprazole (NEXIUM) 20 MG capsule Take 20 mg by mouth daily as needed (FOR HEARTBURN).   unk at prn  . JARDIANCE 10 MG TABS tablet Take 10 mg by mouth daily.  1 12/29/2018 at Unknown time  . mometasone-formoterol (DULERA) 200-5 MCG/ACT AERO Inhale 2 puffs into the lungs 2 (two) times daily.   12/29/2018 at Unknown time   . Multiple Vitamin (MULTIVITAMIN WITH MINERALS) TABS tablet Take 1 tablet by mouth daily.   12/29/2018 at Unknown time  . omeprazole (PRILOSEC) 20 MG capsule Take 20 mg by mouth daily.   12/29/2018 at Unknown time  . potassium chloride (K-DUR,KLOR-CON) 10 MEQ tablet Take 10 mEq by mouth every other day.   12/29/2018 at Unknown time  . sucralfate (CARAFATE) 1 g tablet Take 1 tablet (1 g total) by mouth 4 (four) times daily -  with meals and at bedtime. 28 tablet 0 12/29/2018 at Unknown time  . tamsulosin (FLOMAX) 0.4 MG CAPS capsule Take 0.4 mg by mouth.   12/29/2018 at Unknown time  . torsemide (DEMADEX) 20 MG tablet Take 40 mg by mouth 2 (two) times daily.   12/29/2018 at Unknown time  . warfarin (COUMADIN) 5 MG tablet Take 2.5 mg by mouth daily. Take 2.5 mg daily except 5 mg Thursday   12/28/2018 at 1800  . benzonatate (TESSALON) 100 MG capsule Take 1 capsule (100 mg total) by mouth every 8 (eight) hours. (Patient not taking: Reported on 12/30/2018) 21 capsule 0 Not Taking at Unknown time  . carvedilol (COREG) 3.125  MG tablet Take 1 tablet (3.125 mg total) by mouth 2 (two) times daily with a meal. (Patient not taking: Reported on 12/30/2018) 60 tablet 1 Not Taking at Unknown time  . torsemide (DEMADEX) 20 MG tablet Take 1.5 tablet (30mg ) in AM and 1 table (20mg ) in PM daily (Patient not taking: Reported on 12/30/2018) 75 tablet 1 Not Taking at Unknown time   Scheduled:  . ALPRAZolam  0.25 mg Oral Once  . amiodarone  200 mg Oral Daily  . feeding supplement (ENSURE ENLIVE)  237 mL Oral TID BM  . lactobacillus  1 g Oral TID WC  . mouth rinse  15 mL Mouth Rinse BID  . mometasone-formoterol  2 puff Inhalation BID  . multivitamin with minerals  1 tablet Oral Daily  . pantoprazole  40 mg Oral Daily  . sodium chloride flush  3 mL Intravenous Once  . sodium chloride flush  3 mL Intravenous Q12H  . sodium chloride flush  3 mL Intravenous Q12H  . tamsulosin  0.8 mg Oral QPC supper  . Warfarin - Pharmacist  Dosing Inpatient   Does not apply q1800    Assessment: 50 yoM admitted with volume overload. Pt on warfarin PTA for hx of mural thromus. INR supratherapeutic on admit at 3.69.  INR down to 1.88 today after several doses of Ensure and holding x2 nights. Planning for heart cath today, will likely resume warfarin after.  *Home Dose = 5mg  Thurs, 2.5mg  all other days  Goal of Therapy:  INR 2-3   Plan:  -Hold warfarin for now, will follow-up resuming post/cath -Daily INR   ADDENDUM: Pt s/p L/RHC, to start IV heparin in setting of subtherapeutic INR and LVAD workup. Will continue to hold warfarin at this point while evaluating pt for an LVAD.  Plan: -Heparin 1150 units/hr with no bolus at 2200 tonight -Check heparin level at 0600    Arrie Senate, PharmD, BCPS Clinical Pharmacist [phone] Please check AMION for all [place] numbers 01/07/2019

## 2019-01-08 ENCOUNTER — Encounter (HOSPITAL_COMMUNITY): Payer: Self-pay | Admitting: Internal Medicine

## 2019-01-08 LAB — BASIC METABOLIC PANEL
Anion gap: 8 (ref 5–15)
BUN: 21 mg/dL (ref 8–23)
CO2: 31 mmol/L (ref 22–32)
CREATININE: 1.26 mg/dL — AB (ref 0.61–1.24)
Calcium: 8.8 mg/dL — ABNORMAL LOW (ref 8.9–10.3)
Chloride: 101 mmol/L (ref 98–111)
GFR calc Af Amer: >60 mL/min (ref >60)
GFR calc non Af Amer: 55 mL/min — ABNORMAL LOW (ref >60)
Glucose, Bld: 107 mg/dL — ABNORMAL HIGH (ref 70–99)
Potassium: 4.2 mmol/L (ref 3.5–5.1)
Sodium: 140 mmol/L (ref 135–145)

## 2019-01-08 LAB — COOXEMETRY PANEL
Carboxyhemoglobin: 1.5 % (ref 0.5–1.5)
Methemoglobin: 1.5 % (ref 0.0–1.5)
O2 Saturation: 66.7 %
Total hemoglobin: 12.9 g/dL (ref 12.0–16.0)

## 2019-01-08 LAB — CBC
HCT: 38.9 % — ABNORMAL LOW (ref 39.0–52.0)
Hemoglobin: 12.3 g/dL — ABNORMAL LOW (ref 13.0–17.0)
MCH: 25.9 pg — ABNORMAL LOW (ref 26.0–34.0)
MCHC: 31.6 g/dL (ref 30.0–36.0)
MCV: 82.1 fL (ref 80.0–100.0)
Platelets: 128 10*3/uL — ABNORMAL LOW (ref 150–400)
RBC: 4.74 MIL/uL (ref 4.22–5.81)
RDW: 21.1 % — ABNORMAL HIGH (ref 11.5–15.5)
WBC: 5.2 10*3/uL (ref 4.0–10.5)
nRBC: 0 % (ref 0.0–0.2)

## 2019-01-08 LAB — PROTIME-INR
INR: 1.64
Prothrombin Time: 19.2 seconds — ABNORMAL HIGH (ref 11.4–15.2)

## 2019-01-08 LAB — HEPARIN LEVEL (UNFRACTIONATED)
Heparin Unfractionated: 0.19 IU/mL — ABNORMAL LOW (ref 0.30–0.70)
Heparin Unfractionated: 0.48 IU/mL (ref 0.30–0.70)

## 2019-01-08 NOTE — Progress Notes (Signed)
Please see previous NCM notes, pt states he is active with [facility] but unsure of agency. Medicare.gov list provided and placed on chart. States he believes it is [facility], will have NCM follow up on 2/6. Jonnie Finner RN CCM Case Mgmt phone [phone]

## 2019-01-08 NOTE — Care Management Important Message (Signed)
Important Message  Patient Details  Name: Spencer Rice MRN: [id number] Date of Birth: 1941/02/05   Medicare Important Message Given:  Yes    Azari Janssens P [place] 01/08/2019, 2:38 PM

## 2019-01-08 NOTE — Progress Notes (Signed)
Physical Therapy Treatment Patient Details Name: Spencer Rice MRN: [id number] DOB: 03/25/1941 Today's Date: 01/08/2019    History of Present Illness 78yo male with ongoing SOB, chest x-ray showing pulmonary edema and R pleural effusion. Admitted for CHF exacerbation. PMH kidney cancer, CHF, hx DVT, AICD/PPM plancement, COPD, apical thrombus on Coumadin, hx cardiac cath     PT Comments    Pt admitted with above diagnosis. Pt currently with functional limitations due to balance and endurance deficits. Pt was able to ambulate with min guard assist with rW without LOB on 2LO2.  Pt reports he felt fatigued after walk.  Progressing distance each treatment.   Pt will benefit from skilled PT to increase their independence and safety with mobility to allow discharge to the venue listed below.     Follow Up Recommendations  Home health PT     Equipment Recommendations  Rolling walker with 5" wheels    Recommendations for Other Services       Precautions / Restrictions Precautions Precautions: Fall Restrictions Weight Bearing Restrictions: No    Mobility  Bed Mobility               General bed mobility comments: OOB in chair   Transfers Overall transfer level: Needs assistance Equipment used: None Transfers: Sit to/from Stand Sit to Stand: Supervision         General transfer comment: S for safety, no other physical assist given   Ambulation/Gait Ambulation/Gait assistance: Supervision Gait Distance (Feet): 350 Feet Assistive device: Rolling walker (2 wheeled) Gait Pattern/deviations: WFL(Within Functional Limits);Step-through pattern Gait velocity: decreased  Gait velocity interpretation: 1.31 - 2.62 ft/sec, indicative of limited community ambulator General Gait Details: gait pattern generally WNL with no unsteadiness noted. VSS throughout. On 2LO2 with sats >90%.    Stairs             Wheelchair Mobility    Modified Rankin (Stroke Patients Only)        Balance Overall balance assessment: No apparent balance deficits (not formally assessed)                                          Cognition Arousal/Alertness: Awake/alert Behavior During Therapy: WFL for tasks assessed/performed Overall Cognitive Status: Within Functional Limits for tasks assessed                                        Exercises General Exercises - Lower Extremity Ankle Circles/Pumps: AROM;Both;10 reps;Seated Long Arc Quad: AROM;Both;10 reps;Seated    General Comments        Pertinent Vitals/Pain Pain Assessment: No/denies pain    Home Living                      Prior Function            PT Goals (current goals can now be found in the care plan section) Acute Rehab PT Goals Patient Stated Goal: go home Progress towards PT goals: Progressing toward goals    Frequency    Min 3X/week      PT Plan Current plan remains appropriate    Co-evaluation              AM-PAC PT "6 Clicks" Mobility   Outcome Measure  Help needed turning from your back  to your side while in a flat bed without using bedrails?: A Little Help needed moving from lying on your back to sitting on the side of a flat bed without using bedrails?: A Little Help needed moving to and from a bed to a chair (including a wheelchair)?: A Little Help needed standing up from a chair using your arms (e.g., wheelchair or bedside chair)?: A Little Help needed to walk in hospital room?: A Little Help needed climbing 3-5 steps with a railing? : A Little 6 Click Score: 18    End of Session Equipment Utilized During Treatment: Gait belt;Oxygen Activity Tolerance: Patient tolerated treatment well Patient left: in chair;with call bell/phone within reach Nurse Communication: Mobility status PT Visit Diagnosis: Muscle weakness (generalized) (M62.81);Other abnormalities of gait and mobility (R26.89)     Time: 5697-9480 PT Time  Calculation (min) (ACUTE ONLY): 17 min  Charges:  $Gait Training: 8-22 mins                     [place] Pager:  [phone]  Office:  [place] 01/08/2019, 1:21 PM

## 2019-01-08 NOTE — Progress Notes (Signed)
Discussed with heart failure team who will assume care at this time. If further medical management needed, please re-consult. On discharge, will need follow-up for lung nodule to be followed by PCP.  Cordelia Poche, MD [facility] 01/08/2019, 4:37 PM

## 2019-01-08 NOTE — Progress Notes (Addendum)
Advanced Heart Failure Rounding Note  PCP-Cardiologist: No primary care provider on file.   Subjective:    Remains on milrinone 0.25. Coox 67%. Diuretics on hold. Weight unchanged. Creatinine stable 1.26. CVP 6.   INR 1.64. On heparin drip. Coumadin on hold with ongoing VAD work up.   Denies CP or SOB. Says he has felt good the whole time he's been here. He says he wants to go forward with the LVAD because his family thinks he should do it.    [facility] 01/07/19  Ost RCA lesion is 50% stenosed.  Mid RCA lesion is 40% stenosed.  Prox LAD lesion is 50% stenosed.  Ost 2nd Diag to 2nd Diag lesion is 100% stenosed.   Findings:  Done on milrinone 0.25 mcg/kg/min  RA = 7  RV = 62/8 PA = 65/26 (41) PCW = 19 Fick cardiac output/index = 5.6/2.7 PVR = 3.9 WU FA sat = 95% PA sat = 65%, 66% PaPI = 5.6  ABG on 2L:  7.50/40/67/95%  Assessment: 1. Mild non-obstructive CAD 2. Moderate PAH with normal PAPI 3. Normal CO on milrinone  ECHO 01/01/2019  EF 10% with severe Spencer, severe TR, Biatrial enlargement, RV moderately reduced function.   Objective:   Weight Range: 83.7 kg Body mass index is 24.02 kg/m.   Vital Signs:   Temp:  [98.2 F (36.8 C)-98.8 F (37.1 C)] 98.6 F (37 C) (02/05 0436) Pulse Rate:  [85-99] 86 (02/05 0436) Resp:  [11-19] 16 (02/04 1500) BP: (99-128)/(61-77) 102/67 (02/05 0436) SpO2:  [94 %-100 %] 100 % (02/05 0832) FiO2 (%):  [100 %] 100 % (02/04 1415) Weight:  [83.7 kg] 83.7 kg (02/05 0436) Last BM Date: 01/07/19(per pt)  Weight change: Filed Weights   01/06/19 0630 01/07/19 0340 01/08/19 0436  Weight: 81.8 kg 83.5 kg 83.7 kg    Intake/Output:   Intake/Output Summary (Last 24 hours) at 01/08/2019 0909 Last data filed at 01/07/2019 2337 Gross per 24 hour  Intake 1022.86 ml  Output 650 ml  Net 372.86 ml      Physical Exam   General: No resp difficulty. HEENT: Normal Neck: Supple. JVP 5-6. Carotids 2+ bilat; no bruits. No thyromegaly  or nodule noted. Cor: PMI nondisplaced. RRR, 2/6 Spencer/TR. Lungs: CTAB, normal effort. Abdomen: Soft, non-tender, non-distended, no HSM. No bruits or masses. +BS  Extremities: No cyanosis, clubbing, or rash. R and LLE no edema. BLE unna boots. + RUE PICC Neuro: Alert & orientedx3, cranial nerves grossly intact. moves all 4 extremities w/o difficulty. Affect pleasant  Telemetry   BiV paced 90s. Personally reviewed  EKG    No new tracings.   Labs    CBC Recent Labs    01/06/19 1940  01/07/19 1343 01/08/19 0633  WBC 4.5  --   --  5.2  NEUTROABS 2.9  --   --   --   HGB 12.0*   < > 13.9 12.3*  HCT 36.9*   < > 41.0 38.9*  MCV 80.9  --   --  82.1  PLT 125*  --   --  128*   < > = values in this interval not displayed.   Basic Metabolic Panel Recent Labs    01/07/19 0339  01/07/19 1343 01/08/19 0435  NA 141   < > 141 140  K 4.0   < > 3.9 4.2  CL 101  --   --  101  CO2 32  --   --  31  GLUCOSE  99  --   --  107*  BUN 31*  --   --  21  CREATININE 1.24  --   --  1.26*  CALCIUM 9.0  --   --  8.8*   < > = values in this interval not displayed.   Liver Function Tests Recent Labs    01/06/19 1650  AST 49*  ALT 19  ALKPHOS 98  BILITOT 2.8*  PROT 6.3*  ALBUMIN 2.5*   No results for input(s): LIPASE, AMYLASE in the last 72 hours. Cardiac Enzymes No results for input(s): CKTOTAL, CKMB, CKMBINDEX, TROPONINI in the last 72 hours.  BNP: BNP (last 3 results) Recent Labs    12/04/18 1743 12/28/18 1821 12/29/18 1943  BNP >4,500.0* >4,925.0* >4,500.0*    ProBNP (last 3 results) No results for input(s): PROBNP in the last 8760 hours.   D-Dimer No results for input(s): DDIMER in the last 72 hours. Hemoglobin A1C Recent Labs    01/06/19 1937  HGBA1C 6.8*   Fasting Lipid Panel Recent Labs    01/06/19 1650  CHOL 98  HDL 46  LDLCALC 41  TRIG 57  CHOLHDL 2.1   Thyroid Function Tests Recent Labs    01/06/19 1650  TSH 0.906    Other results:   Imaging      No results found.   Medications:     Scheduled Medications: . ALPRAZolam  0.25 mg Oral Once  . amiodarone  200 mg Oral Daily  . feeding supplement (ENSURE ENLIVE)  237 mL Oral TID BM  . lactobacillus  1 g Oral TID WC  . mouth rinse  15 mL Mouth Rinse BID  . mometasone-formoterol  2 puff Inhalation BID  . multivitamin with minerals  1 tablet Oral Daily  . pantoprazole  40 mg Oral Daily  . sodium chloride flush  3 mL Intravenous Once  . sodium chloride flush  3 mL Intravenous Q12H  . sodium chloride flush  3 mL Intravenous Q12H  . tamsulosin  0.8 mg Oral QPC supper    Infusions: . sodium chloride    . sodium chloride    . heparin 1,400 Units/hr (01/08/19 0831)  . 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    Admitted with marked volume overload.   Assessment/Plan   1. A/C Systolic Heart Failure -> cardiogenic shock - due to NICM, LHC 2015 nonobstructive disease. Has Biotronik BiV.   - Initial CO-OX 38% so milrinone started on 1/28. - ECHO 01/01/19 EF 10% with Severe Spencer/TR Biatrial enlargement RV with moderately reduced function.  - Remains on milrinone 0.25. Co-ox 67%. - Weight down 21 pounds total with diuresis. Renal function improved with diuresis.  - CVP 6. Torsemide on hold. Creatinine 1.26 - He remains on milrinone. Volume status and renal function much improved. He is clearly inotrope dependent. He met with VAD team on 1/30 and had long talk about his situation and need for advanced therapies. He initially was unsure if he would want a man-made device to  help the heart that God gave him. Dr Haroldine Laws had a long talk with him and his  sons on 2/1 and they want to proceed with VAD w/u.  - R/LHC completed yesterday. CO normal on milrinone support. Moderate PAH with normal PAPi. Mild nonobstuctive CAD. - Dr Haroldine Laws discussed with Dr. Prescott Gum - LVAD coordinator following.  - He tells me that he wants to go forward with LVAD, but keeps saying that it is because his family wants him to do it.   2. CKD Stage III, creatinine baseline 1.8-2.4  - Creatinine trending down to 1.26 today with inotrope dependence  3. LBBB - stable s/p BiV ICD. No change.   4. COPD on chronic home oxygen - Sats stable on 2 liters oxygen. No change.   5. L Kidney Cancer  -Had cryoablation in 2015 -Renal function improving with inotropes. No change.   6. PVCs - Started on amiodarone 2019 to suppress PVCs.  - Continue amiodarone 200 mg daily. No change.    7. H/O Mural Thrombus - Coumadin on hold for cath today. INR 1.64. On heparin drip. Plan to hold coumadin with VAD w/u.    8. 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 No change.   9. UTI, Klebsiella pneumoniae  - Completed rocephin course.   10. Pulmonary nodule - 6 mm nodule in LUL noted on CT scan. Recommended 6 month repeat non contrast CT. Will discuss with MD. May need to work up further if he wants to go forward with VAD.    Length of Stay: [place], NP  9:09 AM  Advanced Heart Failure Team Pager [phone] (M-F; 7a - 4p)  Please contact [facility] for night-coverage after hours (4p -7a ) and weekends on [email or web address]  Patient seen and examined with the above-signed Advanced Practice Provider and/or Housestaff. I personally reviewed laboratory data, imaging studies and relevant notes. I independently examined the patient and formulated the important aspects of the plan. I have edited the note to reflect any of my changes or salient points. I have  personally discussed the plan with the patient and/or family.  Results of cath films reviewed with him and his daughter-in-law (by telephone). Mild CAD. He has moderate PAH despite milrinone but CVP and PAPI ok. He is clearly inotrope dependent and VAD discussions are underway. Will ask Dr. Prescott Gum to see him this week as well as the rest of the VAD team. Age and RV dysfunction may be limiting factors. Hopefully we will be able to present him at [facility] next Monday.   Glori Bickers, MD  10:47 PM

## 2019-01-08 NOTE — Progress Notes (Signed)
ANTICOAGULATION CONSULT NOTE - Follow-Up Consult  Pharmacy Consult for heparin Indication: apical mural thrombus  No Known Allergies  Patient Measurements: Height: 6' 1.5" (186.7 cm) Weight: 184 lb 9.6 oz (83.7 kg) IBW/kg (Calculated) : 81.05  Heparin Dosing Wt: 83kg  Vital Signs: Temp: 98.6 F (37 C) (02/05 0436) Temp Source: Oral (02/05 0436) BP: 102/67 (02/05 0436) Pulse Rate: 86 (02/05 0436)  Labs: Recent Labs    01/06/19 0529 01/06/19 1650  01/06/19 1940 01/07/19 0339 01/07/19 1339 01/07/19 1343 01/08/19 0435 01/08/19 0633  HGB  --   --    < > 12.0*  --  13.6  13.9 13.9  --  12.3*  HCT  --   --    < > 36.9*  --  40.0  41.0 41.0  --  38.9*  PLT  --   --   --  125*  --   --   --   --  128*  APTT  --  33  --   --   --   --   --   --   --   LABPROT 25.7*  --   --   --  21.4*  --   --  19.2*  --   INR 2.39  --   --   --  1.88  --   --  1.64  --   HEPARINUNFRC  --   --   --   --   --   --   --  0.19*  --   CREATININE 1.49* 1.49*  --   --  1.24  --   --  1.26*  --    < > = values in this interval not displayed.    Estimated Creatinine Clearance: 56.3 mL/min (A) (by C-G formula based on SCr of 1.26 mg/dL (H)).   Medical History: Past Medical History:  Diagnosis Date  . AICD (automatic cardioverter/defibrillator) present   . Cancer of left kidney ([place])    S/P OR 05/2014  . CHF (congestive heart failure) ([place])   . Coronary artery disease   . DVT (deep venous thrombosis) ([facility]) 1983   LLE  . GERD (gastroesophageal reflux disease)   . History of hiatal hernia   . Hypertension     Medications:  Medications Prior to Admission  Medication Sig Dispense Refill Last Dose  . amiodarone (PACERONE) 200 MG tablet Take 200 mg by mouth daily.   12/29/2018 at am  . aspirin 81 MG chewable tablet Chew 81 mg by mouth.   12/29/2018 at am  . carvedilol (COREG) 12.5 MG tablet Take 6.25 mg by mouth 2 (two) times daily. Taking 1/2 tablet (6.25mg ) twice daily   12/29/2018 at am  .  esomeprazole (NEXIUM) 20 MG capsule Take 20 mg by mouth daily as needed (FOR HEARTBURN).   unk at prn  . JARDIANCE 10 MG TABS tablet Take 10 mg by mouth daily.  1 12/29/2018 at Unknown time  . mometasone-formoterol (DULERA) 200-5 MCG/ACT AERO Inhale 2 puffs into the lungs 2 (two) times daily.   12/29/2018 at Unknown time  . Multiple Vitamin (MULTIVITAMIN WITH MINERALS) TABS tablet Take 1 tablet by mouth daily.   12/29/2018 at Unknown time  . omeprazole (PRILOSEC) 20 MG capsule Take 20 mg by mouth daily.   12/29/2018 at Unknown time  . potassium chloride (K-DUR,KLOR-CON) 10 MEQ tablet Take 10 mEq by mouth every other day.   12/29/2018 at Unknown time  . sucralfate (CARAFATE) 1 g tablet  Take 1 tablet (1 g total) by mouth 4 (four) times daily -  with meals and at bedtime. 28 tablet 0 12/29/2018 at Unknown time  . tamsulosin (FLOMAX) 0.4 MG CAPS capsule Take 0.4 mg by mouth.   12/29/2018 at Unknown time  . torsemide (DEMADEX) 20 MG tablet Take 40 mg by mouth 2 (two) times daily.   12/29/2018 at Unknown time  . warfarin (COUMADIN) 5 MG tablet Take 2.5 mg by mouth daily. Take 2.5 mg daily except 5 mg Thursday   12/28/2018 at 1800  . benzonatate (TESSALON) 100 MG capsule Take 1 capsule (100 mg total) by mouth every 8 (eight) hours. (Patient not taking: Reported on 12/30/2018) 21 capsule 0 Not Taking at Unknown time  . carvedilol (COREG) 3.125 MG tablet Take 1 tablet (3.125 mg total) by mouth 2 (two) times daily with a meal. (Patient not taking: Reported on 12/30/2018) 60 tablet 1 Not Taking at Unknown time  . torsemide (DEMADEX) 20 MG tablet Take 1.5 tablet (30mg ) in AM and 1 table (20mg ) in PM daily (Patient not taking: Reported on 12/30/2018) 75 tablet 1 Not Taking at Unknown time   Scheduled:  . ALPRAZolam  0.25 mg Oral Once  . amiodarone  200 mg Oral Daily  . feeding supplement (ENSURE ENLIVE)  237 mL Oral TID BM  . lactobacillus  1 g Oral TID WC  . mouth rinse  15 mL Mouth Rinse BID  . mometasone-formoterol  2  puff Inhalation BID  . multivitamin with minerals  1 tablet Oral Daily  . pantoprazole  40 mg Oral Daily  . sodium chloride flush  3 mL Intravenous Once  . sodium chloride flush  3 mL Intravenous Q12H  . sodium chloride flush  3 mL Intravenous Q12H  . tamsulosin  0.8 mg Oral QPC supper    Assessment: 89 yoM admitted with volume overload. Pt on warfarin PTA for hx of mural thromus. INR supratherapeutic on admit at 3.69, now down to 1.64 after holding for cath. Pt started on heparin bridge pending LVAD workup. Initial heparin level subtherapeutic at 0.19, CBC stable  *Home Dose = 5mg  Thurs, 2.5mg  all other days  Goal of Therapy:  Heparin level 0.3-0.7 units/ml   Plan:  -Increase heparin to 1400 units/hr -Recheck 8hr heparin level  Arrie Senate, PharmD, BCPS Clinical Pharmacist [phone] Please check AMION for all [place] numbers 01/08/2019

## 2019-01-08 NOTE — Progress Notes (Signed)
ANTICOAGULATION CONSULT NOTE - Follow-Up Consult  Pharmacy Consult for heparin Indication: apical mural thrombus  No Known Allergies  Patient Measurements: Height: 6' 1.5" (186.7 cm) Weight: 184 lb 9.6 oz (83.7 kg) IBW/kg (Calculated) : 81.05  Heparin Dosing Wt: 83kg  Vital Signs:    Labs: Recent Labs    01/06/19 0529 01/06/19 1650  01/06/19 1940 01/07/19 0339 01/07/19 1339 01/07/19 1343 01/08/19 0435 01/08/19 0633 01/08/19 1630  HGB  --   --    < > 12.0*  --  13.6  13.9 13.9  --  12.3*  --   HCT  --   --    < > 36.9*  --  40.0  41.0 41.0  --  38.9*  --   PLT  --   --   --  125*  --   --   --   --  128*  --   APTT  --  33  --   --   --   --   --   --   --   --   LABPROT 25.7*  --   --   --  21.4*  --   --  19.2*  --   --   INR 2.39  --   --   --  1.88  --   --  1.64  --   --   HEPARINUNFRC  --   --   --   --   --   --   --  0.19*  --  0.48  CREATININE 1.49* 1.49*  --   --  1.24  --   --  1.26*  --   --    < > = values in this interval not displayed.    Estimated Creatinine Clearance: 56.3 mL/min (A) (by C-G formula based on SCr of 1.26 mg/dL (H)).   Medical History: Past Medical History:  Diagnosis Date  . AICD (automatic cardioverter/defibrillator) present   . Cancer of left kidney ([place])    S/P OR 05/2014  . CHF (congestive heart failure) ([place])   . Coronary artery disease   . DVT (deep venous thrombosis) ([facility]) 1983   LLE  . GERD (gastroesophageal reflux disease)   . History of hiatal hernia   . Hypertension     Medications:  Medications Prior to Admission  Medication Sig Dispense Refill Last Dose  . amiodarone (PACERONE) 200 MG tablet Take 200 mg by mouth daily.   12/29/2018 at am  . aspirin 81 MG chewable tablet Chew 81 mg by mouth.   12/29/2018 at am  . carvedilol (COREG) 12.5 MG tablet Take 6.25 mg by mouth 2 (two) times daily. Taking 1/2 tablet (6.25mg ) twice daily   12/29/2018 at am  . esomeprazole (NEXIUM) 20 MG capsule Take 20 mg by mouth daily as  needed (FOR HEARTBURN).   unk at prn  . JARDIANCE 10 MG TABS tablet Take 10 mg by mouth daily.  1 12/29/2018 at Unknown time  . mometasone-formoterol (DULERA) 200-5 MCG/ACT AERO Inhale 2 puffs into the lungs 2 (two) times daily.   12/29/2018 at Unknown time  . Multiple Vitamin (MULTIVITAMIN WITH MINERALS) TABS tablet Take 1 tablet by mouth daily.   12/29/2018 at Unknown time  . omeprazole (PRILOSEC) 20 MG capsule Take 20 mg by mouth daily.   12/29/2018 at Unknown time  . potassium chloride (K-DUR,KLOR-CON) 10 MEQ tablet Take 10 mEq by mouth every other day.   12/29/2018 at Unknown time  .  sucralfate (CARAFATE) 1 g tablet Take 1 tablet (1 g total) by mouth 4 (four) times daily -  with meals and at bedtime. 28 tablet 0 12/29/2018 at Unknown time  . tamsulosin (FLOMAX) 0.4 MG CAPS capsule Take 0.4 mg by mouth.   12/29/2018 at Unknown time  . torsemide (DEMADEX) 20 MG tablet Take 40 mg by mouth 2 (two) times daily.   12/29/2018 at Unknown time  . warfarin (COUMADIN) 5 MG tablet Take 2.5 mg by mouth daily. Take 2.5 mg daily except 5 mg Thursday   12/28/2018 at 1800  . benzonatate (TESSALON) 100 MG capsule Take 1 capsule (100 mg total) by mouth every 8 (eight) hours. (Patient not taking: Reported on 12/30/2018) 21 capsule 0 Not Taking at Unknown time  . carvedilol (COREG) 3.125 MG tablet Take 1 tablet (3.125 mg total) by mouth 2 (two) times daily with a meal. (Patient not taking: Reported on 12/30/2018) 60 tablet 1 Not Taking at Unknown time  . torsemide (DEMADEX) 20 MG tablet Take 1.5 tablet (30mg ) in AM and 1 table (20mg ) in PM daily (Patient not taking: Reported on 12/30/2018) 75 tablet 1 Not Taking at Unknown time   Scheduled:  . ALPRAZolam  0.25 mg Oral Once  . amiodarone  200 mg Oral Daily  . feeding supplement (ENSURE ENLIVE)  237 mL Oral TID BM  . lactobacillus  1 g Oral TID WC  . mouth rinse  15 mL Mouth Rinse BID  . mometasone-formoterol  2 puff Inhalation BID  . multivitamin with minerals  1 tablet  Oral Daily  . pantoprazole  40 mg Oral Daily  . sodium chloride flush  3 mL Intravenous Once  . sodium chloride flush  3 mL Intravenous Q12H  . sodium chloride flush  3 mL Intravenous Q12H  . tamsulosin  0.8 mg Oral QPC supper    Assessment: 42 yoM admitted with volume overload. Pt on warfarin PTA for hx of mural thromus. INR supratherapeutic on admit at 3.69, now down to 1.64 after holding for cath. Pt started on heparin bridge pending LVAD workup.   Repeat heparin level came back therapeutic at 0.48, on 1400 units/hr. Hgb 12.3, plt 128. No s/sx of bleeding. No infusion issues.   *Home Dose = 5mg  Thurs, 2.5mg  all other days  Goal of Therapy:  Heparin level 0.3-0.7 units/ml   Plan:  -Continue heparin infusion at 1400 units/hr -Monitor heparin level with AM labs -Monitor daily HL, CBC, and for s/sx of bleeding  Antonietta Jewel, PharmD, BCCCP Clinical Pharmacist  Pager: [phone] Phone: [phone] Please check AMION for all [place] numbers 01/08/2019

## 2019-01-09 DIAGNOSIS — J449 Chronic obstructive pulmonary disease, unspecified: Secondary | ICD-10-CM

## 2019-01-09 DIAGNOSIS — Z515 Encounter for palliative care: Secondary | ICD-10-CM

## 2019-01-09 DIAGNOSIS — I5023 Acute on chronic systolic (congestive) heart failure: Secondary | ICD-10-CM

## 2019-01-09 DIAGNOSIS — E119 Type 2 diabetes mellitus without complications: Secondary | ICD-10-CM

## 2019-01-09 DIAGNOSIS — Z7189 Other specified counseling: Secondary | ICD-10-CM

## 2019-01-09 LAB — BASIC METABOLIC PANEL
Anion gap: 12 (ref 5–15)
BUN: 21 mg/dL (ref 8–23)
CHLORIDE: 98 mmol/L (ref 98–111)
CO2: 27 mmol/L (ref 22–32)
Calcium: 8.9 mg/dL (ref 8.9–10.3)
Creatinine, Ser: 1.13 mg/dL (ref 0.61–1.24)
GFR calc Af Amer: >60 mL/min (ref >60)
GFR calc non Af Amer: >60 mL/min (ref >60)
Glucose, Bld: 139 mg/dL — ABNORMAL HIGH (ref 70–99)
Potassium: 4.4 mmol/L (ref 3.5–5.1)
SODIUM: 137 mmol/L (ref 135–145)

## 2019-01-09 LAB — PROTIME-INR
INR: 1.53
Prothrombin Time: 18.2 seconds — ABNORMAL HIGH (ref 11.4–15.2)

## 2019-01-09 LAB — CBC
HEMATOCRIT: 36.6 % — AB (ref 39.0–52.0)
Hemoglobin: 11.8 g/dL — ABNORMAL LOW (ref 13.0–17.0)
MCH: 25.9 pg — ABNORMAL LOW (ref 26.0–34.0)
MCHC: 32.2 g/dL (ref 30.0–36.0)
MCV: 80.4 fL (ref 80.0–100.0)
Platelets: 122 10*3/uL — ABNORMAL LOW (ref 150–400)
RBC: 4.55 MIL/uL (ref 4.22–5.81)
RDW: 21.1 % — ABNORMAL HIGH (ref 11.5–15.5)
WBC: 6.2 10*3/uL (ref 4.0–10.5)
nRBC: 0 % (ref 0.0–0.2)

## 2019-01-09 LAB — COOXEMETRY PANEL
Carboxyhemoglobin: 1.8 % — ABNORMAL HIGH (ref 0.5–1.5)
Methemoglobin: 1.6 % — ABNORMAL HIGH (ref 0.0–1.5)
O2 Saturation: 65.6 %
Total hemoglobin: 12.3 g/dL (ref 12.0–16.0)

## 2019-01-09 LAB — MAGNESIUM: Magnesium: 2.4 mg/dL (ref 1.7–2.4)

## 2019-01-09 LAB — HEPARIN LEVEL (UNFRACTIONATED): HEPARIN UNFRACTIONATED: 0.53 [IU]/mL (ref 0.30–0.70)

## 2019-01-09 LAB — FACTOR 5 LEIDEN

## 2019-01-09 MED ORDER — SPIRONOLACTONE 12.5 MG HALF TABLET
12.5000 mg | ORAL_TABLET | Freq: Every day | ORAL | Status: DC
  Administered 2019-01-09 – 2019-01-28 (×20): 12.5 mg via ORAL
  Filled 2019-01-09 (×20): qty 1

## 2019-01-09 MED ORDER — TORSEMIDE 20 MG PO TABS
40.0000 mg | ORAL_TABLET | Freq: Every day | ORAL | Status: DC
  Administered 2019-01-09: 40 mg via ORAL
  Filled 2019-01-09: qty 2

## 2019-01-09 NOTE — Progress Notes (Addendum)
Advanced Heart Failure Rounding Note  PCP-Cardiologist: No primary care provider on file.   Subjective:    Remains on milrinone 0.25. Coox 66%. Diuretics on hold. Weight unchanged. Creatinine stable 1.1. CVP 8. Several short runs of NSVT overnight. K 4.4. Add on mag.   INR 1.53. On heparin drip. Coumadin on hold with ongoing VAD work up.   Dr Prescott Gum saw him this morning. Planning to make a decision after MRB Monday.     Denies SOB. Had some musculoskeletal chest pain throughout chest while walking. None otherwise. Says he is ready to go forward with VAD.   [facility] 01/07/19  Ost RCA lesion is 50% stenosed.  Mid RCA lesion is 40% stenosed.  Prox LAD lesion is 50% stenosed.  Ost 2nd Diag to 2nd Diag lesion is 100% stenosed.   Findings:  Done on milrinone 0.25 mcg/kg/min  RA = 7  RV = 62/8 PA = 65/26 (41) PCW = 19 Fick cardiac output/index = 5.6/2.7 PVR = 3.9 WU FA sat = 95% PA sat = 65%, 66% PaPI = 5.6  ABG on 2L:  7.50/40/67/95%  Assessment: 1. Mild non-obstructive CAD 2. Moderate PAH with normal PAPI 3. Normal CO on milrinone  ECHO 01/01/2019  EF 10% with severe MR, severe TR, Biatrial enlargement, RV moderately reduced function.   Objective:   Weight Range: 83.7 kg Body mass index is 24.02 kg/m.   Vital Signs:   Temp:  [97.7 F (36.5 C)-98.8 F (37.1 C)] 98.5 F (36.9 C) (02/06 0741) Pulse Rate:  [83-94] 90 (02/06 0741) BP: (97-111)/(69-82) 97/75 (02/06 0741) SpO2:  [97 %-99 %] 98 % (02/06 0810) Weight:  [83.7 kg] 83.7 kg (02/06 0417) Last BM Date: 01/08/19  Weight change: Filed Weights   01/07/19 0340 01/08/19 0436 01/09/19 0417  Weight: 83.5 kg 83.7 kg 83.7 kg    Intake/Output:   Intake/Output Summary (Last 24 hours) at 01/09/2019 1041 Last data filed at 01/09/2019 0230 Gross per 24 hour  Intake 847 ml  Output 340 ml  Net 507 ml      Physical Exam   General: No resp difficulty. HEENT: Normal Neck: Supple. JVP 6-7. Carotids 2+  bilat; no bruits. No thyromegaly or nodule noted. Cor: PMI nondisplaced. RRR, 2/6 MR/TR. Lungs: CTAB, normal effort. Abdomen: Soft, non-tender, non-distended, no HSM. No bruits or masses. +BS  Extremities: No cyanosis, clubbing, or rash. R and LLE 1+ edema. BLE unna boots. + RUE PICC Neuro: Alert & orientedx3, cranial nerves grossly intact. moves all 4 extremities w/o difficulty. Affect pleasant  Telemetry   BiV Paced 90s. Several runs of NSVT 4-5 beats long. Personally reviewed  EKG    No new tracings.   Labs    CBC Recent Labs    01/06/19 1940  01/08/19 0633 01/09/19 0502  WBC 4.5  --  5.2 6.2  NEUTROABS 2.9  --   --   --   HGB 12.0*   < > 12.3* 11.8*  HCT 36.9*   < > 38.9* 36.6*  MCV 80.9  --  82.1 80.4  PLT 125*  --  128* 122*   < > = values in this interval not displayed.   Basic Metabolic Panel Recent Labs    01/08/19 0435 01/09/19 0502  NA 140 137  K 4.2 4.4  CL 101 98  CO2 31 27  GLUCOSE 107* 139*  BUN 21 21  CREATININE 1.26* 1.13  CALCIUM 8.8* 8.9   Liver Function Tests Recent Labs  01/06/19 1650  AST 49*  ALT 19  ALKPHOS 98  BILITOT 2.8*  PROT 6.3*  ALBUMIN 2.5*   No results for input(s): LIPASE, AMYLASE in the last 72 hours. Cardiac Enzymes No results for input(s): CKTOTAL, CKMB, CKMBINDEX, TROPONINI in the last 72 hours.  BNP: BNP (last 3 results) Recent Labs    12/04/18 1743 12/28/18 1821 12/29/18 1943  BNP >4,500.0* >4,925.0* >4,500.0*    ProBNP (last 3 results) No results for input(s): PROBNP in the last 8760 hours.   D-Dimer No results for input(s): DDIMER in the last 72 hours. Hemoglobin A1C Recent Labs    01/06/19 1937  HGBA1C 6.8*   Fasting Lipid Panel Recent Labs    01/06/19 1650  CHOL 98  HDL 46  LDLCALC 41  TRIG 57  CHOLHDL 2.1   Thyroid Function Tests Recent Labs    01/06/19 1650  TSH 0.906    Other results:   Imaging    No results found.   Medications:     Scheduled  Medications: . ALPRAZolam  0.25 mg Oral Once  . amiodarone  200 mg Oral Daily  . feeding supplement (ENSURE ENLIVE)  237 mL Oral TID BM  . lactobacillus  1 g Oral TID WC  . mouth rinse  15 mL Mouth Rinse BID  . mometasone-formoterol  2 puff Inhalation BID  . multivitamin with minerals  1 tablet Oral Daily  . pantoprazole  40 mg Oral Daily  . sodium chloride flush  3 mL Intravenous Once  . sodium chloride flush  3 mL Intravenous Q12H  . sodium chloride flush  3 mL Intravenous Q12H  . tamsulosin  0.8 mg Oral QPC supper    Infusions: . sodium chloride    . sodium chloride    . heparin 1,400 Units/hr (01/09/19 0940)  . 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    Admitted with marked volume overload.   Assessment/Plan   1. A/C Systolic Heart Failure -> cardiogenic shock - due to NICM, LHC 2015 nonobstructive disease. Has Biotronik BiV.   - Initial CO-OX 38% so milrinone started on 1/28. - ECHO 01/01/19 EF 10% with Severe MR/TR Biatrial enlargement RV with moderately reduced function.  - Remains on milrinone 0.25. Co-ox 66%. - Weight down 21 pounds total with diuresis. Renal function improved with diuresis.  - CVP 8. Not on diuretics. Creatinine stable 1.13 - Start spiro 12.5 mg daily. SBP generally 100-110s - He remains on milrinone. Volume status and renal function much improved. He is clearly inotrope dependent. He met with VAD team on 1/30 and had long talk about his situation and need for advanced therapies. He initially was unsure if he would want a man-made device to  help the heart that God gave him. Dr Haroldine Laws had a long talk with him and his sons on 2/1 and they want to proceed with VAD w/u.  - R/LHC completed 2/4. CO normal on milrinone support. Moderate PAH with normal PAPi. Mild nonobstuctive CAD. - Dr Haroldine Laws discussed with Dr. Prescott Gum. Dr Prescott Gum saw him this am.  - LVAD coordinator following.  -  He tells me that he wants to go forward with LVAD, but keeps saying that it is because his family wants him to do it.  - Planning for potential LVAD. Will make decision at [facility] on Monday.   2. CKD Stage III, creatinine baseline 1.8-2.4  - Creatinine 1.13 today with inotrope dependence  3. LBBB - stable s/p BiV ICD. No change.   4. COPD on chronic home oxygen - Sats stable on 2 liters oxygen. No change   5. L Kidney Cancer  -Had cryoablation in 2015 -Renal function improving with inotropes. No change.   6. PVCs - Started on amiodarone 2019 to suppress PVCs.  - Continue amiodarone 200 mg daily. No change.   7. H/O Mural Thrombus - INR 1.53. On heparin drip. Hold coumadin with VAD w/u.    8. 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 No change.   9. UTI, Klebsiella pneumoniae  - Completed rocephin course. No change.   10. Pulmonary nodule - 6 mm nodule in LUL noted on CT scan. Recommended 6 month repeat non contrast CT. Will discuss with MD. May need to work up further if he wants to go forward with VAD. No change.   11. NSVT - K 4.4. Add on mag.    Length of Stay: [place], NP  10:41 AM  Advanced Heart Failure Team Pager [phone] (M-F; 7a - 4p)  Please contact [facility] for night-coverage after hours (4p -7a ) and weekends on [email or web address]  Patient seen and examined with the above-signed Advanced Practice Provider and/or Housestaff. I personally reviewed laboratory data, imaging studies and relevant notes. I independently examined the patient and formulated the important  aspects of the plan. I have edited the note to reflect any of my changes or salient points. I have personally discussed the plan with the patient and/or family.  Remains on IV milrinone. Co-ox stable at 65% CVP climbing. Will restart po diuretics. On heparin for mural thrombus. He remains interested in VAD. Seen by Dr. Prescott Gum this am. Family will meet with SW on Monday am to decide on VAD. Transportation may be an issue.   Glori Bickers, MD  4:50 PM

## 2019-01-09 NOTE — Plan of Care (Signed)
  Problem: Clinical Measurements: Goal: Will remain free from infection 01/09/2019 0343 by Colonel Bald, RN Outcome: Progressing Note:  No s/s of infection noted. 01/09/2019 0339 by Colonel Bald, RN Outcome: Progressing Note:  No s/s of infection noted. Goal: Respiratory complications will improve 01/09/2019 0343 by Colonel Bald, RN Outcome: Progressing Note:  No s/s of respiratory complications noted. 01/09/2019 0339 by Colonel Bald, RN Note:  No s/s of respiratory complications noted.

## 2019-01-09 NOTE — Consult Note (Signed)
[address]       [place] [address]             [phone]        Dimetri Paynter [place] Medical Record #[id number] Date of Birth: 11/11/1941  Referring: No ref. provider found Primary Care: [name]., MD Primary Cardiologist:No primary care provider on file.  Chief Complaint:   Shortness of breath, edema, fatigue Chief Complaint  Patient presents with  . Shortness of Breath  Patient examined, images of echocardiogram, coronary angiogram and right heart cath hemodynamic data personally reviewed.  History of Present Illness:     78 year old AA diabetic male reformed smoker was recently admitted for Advanced heart failure.  He had been seen in emergency department 5 times for heart failure symptoms since December.  Patient has had a previously placed AICD-pacemaker-she has never received a shock.  He is on chronic Coumadin therapy for low EF and LV thrombus.  On this admission the advanced heart failure service was consulted and he was found to be in low cardiac output with severe peripheral edema and elevated CVP.  He was placed on milrinone and aggressive diuretic therapy.  He has diuresed over 25 pounds.  His symptoms of fatigue and dyspnea have improved over the hospital stay.  Echocardiogram shows LVEF 20%, moderate RV dysfunction with moderate to severe tricuspid regurgitation, severe mitral regurgitation.  No significant AI.  Right heart cath data demonstrates pulmonary hypertension with improved CVP after diuresis.  RV power index is greater than 3.  Patient's renal function has been stable with inotropic support and his cardiac output has improved.  He is now able to ambulate without oxygen.  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  He has been followed with serial CT scans without evidence of recurrence.  Chest CT scan is clear.  There is a small 6 mm left upper lobe round nodule which appears  to be at low risk for malignancy.  No significant mediastinal adenopathy.  PFTs are pending.  Patient is being evaluated by the mechanical cardiac support team for possible implantable LVAD.  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  Patient has type 2 diabetes.  Hemoglobin A1c is normal.  Patient denies any bleeding complications from long use of Coumadin. Current Activity/ Functional Status: Patient lives with his wife has sedentary activity His lawn care business is on hold during winter. Zubrod Score: At the time of surgery this patient's most appropriate activity status/level should be described as: []     0    Normal activity, no symptoms []     1    Restricted in physical strenuous activity but ambulatory, able to do out light work []     2    Ambulatory and capable of self care, unable to do work activities, up and about                 more than 50%  Of the time                            [x]     3    Only limited self care, in bed greater than 50% of waking hours []     4    Completely disabled, no self care, confined to bed or chair []   5    Moribund  Past Medical History:  Diagnosis Date  . AICD (automatic cardioverter/defibrillator) present   . Cancer of left kidney ([place])    S/P OR 05/2014  . CHF (congestive heart failure) ([place])   . Coronary artery disease   . DVT (deep venous thrombosis) ([facility]) 1983   LLE  . GERD (gastroesophageal reflux disease)   . History of hiatal hernia   . Hypertension     Past Surgical History:  Procedure Laterality Date  . CARDIAC CATHETERIZATION  1980's  . IMPLANTABLE CARDIOVERTER DEFIBRILLATOR IMPLANT  07/21/2010  . IR GENERIC HISTORICAL  12/23/2014   IR RADIOLOGIST EVAL & MGMT 12/23/2014 [name], MD [facility] INTER[facility] RAD  . IR GENERIC HISTORICAL  07/20/2016   IR RADIOLOGIST EVAL & MGMT 07/20/2016 [facility] INTERV RAD  . IR RADIOLOGIST EVAL & MGMT  07/04/2017  . RENAL CRYOABLATION  Left 05/2014  . RIGHT/LEFT HEART CATH AND CORONARY ANGIOGRAPHY N/A 01/07/2019   Procedure: RIGHT/LEFT HEART CATH AND CORONARY ANGIOGRAPHY;  Surgeon: Jolaine Artist, MD;  Location: [facility];  Service: Cardiovascular;  Laterality: N/A;    Social History   Tobacco Use  Smoking Status Former Smoker  . Packs/day: 0.75  . Years: 17.00  . Pack years: 12.75  . Types: Cigarettes  . Start date: 05/08/1959  . Last attempt to quit: 06/20/1976  . Years since quitting: 42.5  Smokeless Tobacco Never Used    Social History   Substance and Sexual Activity  Alcohol Use No     No Known Allergies  Current Facility-Administered Medications  Medication Dose Route Frequency Provider Last Rate Last Dose  . 0.9 %  sodium chloride infusion  250 mL Intravenous PRN Bensimhon, Shaune Pascal, MD      . 0.9 %  sodium chloride infusion  250 mL Intravenous PRN Bensimhon, Shaune Pascal, MD      . acetaminophen (TYLENOL) tablet 650 mg  650 mg Oral Q4H PRN Bensimhon, Shaune Pascal, MD      . ALPRAZolam Duanne Moron) tablet 0.25 mg  0.25 mg Oral Once Bensimhon, Shaune Pascal, MD      . alum & mag hydroxide-simeth (MAALOX/MYLANTA) 200-200-20 MG/5ML suspension 30 mL  30 mL Oral Q6H PRN Elgergawy, Silver Huguenin, MD   30 mL at 01/08/19 0003  . amiodarone (PACERONE) tablet 200 mg  200 mg Oral Daily Bensimhon, Shaune Pascal, MD   200 mg at 01/09/19 0948  . feeding supplement (ENSURE ENLIVE) (ENSURE ENLIVE) liquid 237 mL  237 mL Oral TID BM Bensimhon, Shaune Pascal, MD   237 mL at 01/09/19 0950  . heparin ADULT infusion 100 units/mL (25000 units/269mL sodium chloride 0.45%)  1,400 Units/hr Intravenous Continuous Einar Grad, RPH 14 mL/hr at 01/09/19 0940 1,400 Units/hr at 01/09/19 0940  . lactobacillus (FLORANEX/LACTINEX) granules 1 g  1 g Oral TID WC Bensimhon, Shaune Pascal, MD   1 g at 01/09/19 1652  . MEDLINE mouth rinse  15 mL Mouth Rinse BID Bensimhon, Shaune Pascal, MD   15 mL at 01/09/19 0950  . milrinone (PRIMACOR) 20 MG/100 ML (0.2 mg/mL)  infusion  0.25 mcg/kg/min Intravenous Continuous Bensimhon, Shaune Pascal, MD 6.92 mL/hr at 01/09/19 0458 0.25 mcg/kg/min at 01/09/19 0458  . mometasone-formoterol (DULERA) 200-5 MCG/ACT inhaler 2 puff  2 puff Inhalation BID Bensimhon, Shaune Pascal, MD   2 puff at 01/09/19 0810  . multivitamin with minerals tablet 1 tablet  1 tablet Oral Daily Bensimhon, Shaune Pascal, MD   1 tablet at 01/09/19 [phone]  .  ondansetron (ZOFRAN) injection 4 mg  4 mg Intravenous Q6H PRN Bensimhon, Shaune Pascal, MD      . pantoprazole (PROTONIX) EC tablet 40 mg  40 mg Oral Daily Bensimhon, Shaune Pascal, MD   40 mg at 01/09/19 0948  . sodium chloride flush (NS) 0.9 % injection 10-40 mL  10-40 mL Intracatheter PRN Bensimhon, Shaune Pascal, MD      . sodium chloride flush (NS) 0.9 % injection 3 mL  3 mL Intravenous Once Bensimhon, Shaune Pascal, MD      . sodium chloride flush (NS) 0.9 % injection 3 mL  3 mL Intravenous Q12H Bensimhon, Shaune Pascal, MD   3 mL at 01/07/19 2149  . sodium chloride flush (NS) 0.9 % injection 3 mL  3 mL Intravenous PRN Bensimhon, Shaune Pascal, MD      . sodium chloride flush (NS) 0.9 % injection 3 mL  3 mL Intravenous Q12H Bensimhon, Shaune Pascal, MD      . sodium chloride flush (NS) 0.9 % injection 3 mL  3 mL Intravenous PRN Bensimhon, Shaune Pascal, MD      . spironolactone (ALDACTONE) tablet 12.5 mg  12.5 mg Oral QHS Georgiana Shore, NP      . tamsulosin (FLOMAX) capsule 0.8 mg  0.8 mg Oral QPC supper Bensimhon, Shaune Pascal, MD   0.8 mg at 01/09/19 1652  . torsemide (DEMADEX) tablet 40 mg  40 mg Oral Daily Bensimhon, Shaune Pascal, MD   40 mg at 01/09/19 1420    Medications Prior to Admission  Medication Sig Dispense Refill Last Dose  . amiodarone (PACERONE) 200 MG tablet Take 200 mg by mouth daily.   12/29/2018 at am  . aspirin 81 MG chewable tablet Chew 81 mg by mouth.   12/29/2018 at am  . carvedilol (COREG) 12.5 MG tablet Take 6.25 mg by mouth 2 (two) times daily. Taking 1/2 tablet (6.25mg ) twice daily   12/29/2018 at am  . esomeprazole (NEXIUM)  20 MG capsule Take 20 mg by mouth daily as needed (FOR HEARTBURN).   unk at prn  . JARDIANCE 10 MG TABS tablet Take 10 mg by mouth daily.  1 12/29/2018 at Unknown time  . mometasone-formoterol (DULERA) 200-5 MCG/ACT AERO Inhale 2 puffs into the lungs 2 (two) times daily.   12/29/2018 at Unknown time  . Multiple Vitamin (MULTIVITAMIN WITH MINERALS) TABS tablet Take 1 tablet by mouth daily.   12/29/2018 at Unknown time  . omeprazole (PRILOSEC) 20 MG capsule Take 20 mg by mouth daily.   12/29/2018 at Unknown time  . potassium chloride (K-DUR,KLOR-CON) 10 MEQ tablet Take 10 mEq by mouth every other day.   12/29/2018 at Unknown time  . sucralfate (CARAFATE) 1 g tablet Take 1 tablet (1 g total) by mouth 4 (four) times daily -  with meals and at bedtime. 28 tablet 0 12/29/2018 at Unknown time  . tamsulosin (FLOMAX) 0.4 MG CAPS capsule Take 0.4 mg by mouth.   12/29/2018 at Unknown time  . torsemide (DEMADEX) 20 MG tablet Take 40 mg by mouth 2 (two) times daily.   12/29/2018 at Unknown time  . warfarin (COUMADIN) 5 MG tablet Take 2.5 mg by mouth daily. Take 2.5 mg daily except 5 mg Thursday   12/28/2018 at 1800  . benzonatate (TESSALON) 100 MG capsule Take 1 capsule (100 mg total) by mouth every 8 (eight) hours. (Patient not taking: Reported on 12/30/2018) 21 capsule 0 Not Taking at Unknown time  . carvedilol (COREG) 3.125 MG tablet  Take 1 tablet (3.125 mg total) by mouth 2 (two) times daily with a meal. (Patient not taking: Reported on 12/30/2018) 60 tablet 1 Not Taking at Unknown time  . torsemide (DEMADEX) 20 MG tablet Take 1.5 tablet (30mg ) in AM and 1 table (20mg ) in PM daily (Patient not taking: Reported on 12/30/2018) 75 tablet 1 Not Taking at Unknown time    Family History  Problem Relation Age of Onset  . Heart disease Father   . Heart attack Father 81  . Hypertension Father   . Hypertension Mother      Review of Systems:   ROS Patient lives with his wife who has chronic medical problems as well.      Cardiac Review of Systems: Y or  [    ]= no  Chest Pain [    ]  Resting SOB [   ] Exertional SOB  [  y]  Orthopnea [  ]   Pedal Edema [   ]    Palpitations [  ] Syncope  [  ]   Presyncope [   ]  General Review of Systems: [Y] = yes [  ]=no Constitional: recent weight change [ y ]; anorexia [  ]; fatigue [ y ]; nausea [  ]; night sweats [  ]; fever [  ]; or chills [  ]                                                               Dental: Last Dentist visit: 1 year  Eye : blurred vision [  ]; diplopia [   ]; vision changes [  ];  Amaurosis fugax[  ]; Resp: cough [  ];  wheezing[  ];  hemoptysis[  ]; shortness of breath[  ]; paroxysmal nocturnal dyspnea[  ]; dyspnea on exertion[ y ]; or orthopnea[  ];  GI:  gallstones[  ], vomiting[  ];  dysphagia[  ]; melena[  ];  hematochezia [  ]; heartburn[  ];   Hx of  Colonoscopy[  ]; GU: kidney stones [  ]; hematuria[  ];   dysuria [  ];  nocturia[  ];  history of     obstruction [  ]; urinary frequency [  ] history of left renal cancer, treated             Skin: rash, swelling[  ];, hair loss[  ];  peripheral edema[  ];  or itching[  ]; Musculosketetal: myalgias[  ];  joint swelling[  ];  joint erythema[  ];  joint pain[  ];  back pain[  ];  Heme/Lymph: bruising[  ];  bleeding[  ];  anemia[  ];  Neuro: TIA[  ];  headaches[  ];  stroke[  ];  vertigo[  ];  seizures[  ];   paresthesias[  ];  difficulty walking[  ];  Psych:depression[  ]; anxiety[  ];  Endocrine: diabetes[y  ];  thyroid dysfunction[  ];                Physical Exam: BP 98/75 (BP Location: Left Arm)   Pulse 97   Temp 98.6 F (37 C) (Oral)   Resp 16   Ht 6' 1.5" (1.867 m)   Wt 83.7 kg   SpO2 97%  BMI 24.02 kg/m        Physical Exam  General: 78 year old AA male alert and oriented no acute distress HEENT: Normocephalic pupils equal , dentition adequate Neck: Supple without JVD, adenopathy, or bruit Chest: Clear to auscultation, symmetrical breath sounds, no rhonchi, no  tenderness             or deformity Cardiovascular: Regular rate and rhythm, 3/6 MR murmur, no gallop, peripheral pulses             palpable in all extremities Abdomen:  Soft, nontender, no palpable mass or organomegaly Extremities: Warm, well-perfused, both legs wrapped in Unna boots for severe leg edema Rectal/GU: Deferred Neuro: Grossly non--focal and symmetrical throughout Skin: Clean and dry without rash or ulceration   Diagnostic Studies & Laboratory data:     Recent Radiology Findings:   No results found.   I have independently reviewed the above radiologic studies and discussed with the patient   Recent Lab Findings: Lab Results  Component Value Date   WBC 6.2 01/09/2019   HGB 11.8 (L) 01/09/2019   HCT 36.6 (L) 01/09/2019   PLT 122 (L) 01/09/2019   GLUCOSE 139 (H) 01/09/2019   CHOL 98 01/06/2019   TRIG 57 01/06/2019   HDL 46 01/06/2019   LDLCALC 41 01/06/2019   ALT 19 01/06/2019   AST 49 (H) 01/06/2019   NA 137 01/09/2019   K 4.4 01/09/2019   CL 98 01/09/2019   CREATININE 1.13 01/09/2019   BUN 21 01/09/2019   CO2 27 01/09/2019   TSH 0.906 01/06/2019   INR 1.53 01/09/2019   HGBA1C 6.8 (H) 01/06/2019      Assessment / Plan:      Severe nonischemic cardiomyopathy with biventricular failure, pulmonary hypertension, and cardiogenic shock on presentation.  Severe MR and TR  History of left renal cell cancer, treated Diabetes COPD remote history of smoking  Patient is a potential candidate for implantable LVAD therapy.  Agree with proceeding with full evaluation by the [facility] team.   @ME1 @ 01/09/2019 5:48 PM

## 2019-01-09 NOTE — Progress Notes (Signed)
CSW met at bedside with patient to complete LVAD Assessment. CSW and patient discussed at length and contacted patient's wife via phone. There was an extensive conversation around the role of the caregiver and a family meeting was arranged for Monday February 10th @ 11am at bedside. CSW will follow up with patient and family at that time and complete VAD assessment. Raquel Sarna, [place], [place]

## 2019-01-09 NOTE — Progress Notes (Signed)
Inpatient Diabetes Program Recommendations  AACE/ADA: New Consensus Statement on Inpatient Glycemic Control (2015)  Target Ranges:  Prepandial:   less than 140 mg/dL      Peak postprandial:   less than 180 mg/dL (1-2 hours)      Critically ill patients:  140 - 180 mg/dL   Lab Results  Component Value Date   GLUCAP 108 (H) 05/05/2018   HGBA1C 6.8 (H) 01/06/2019   Spoke with patient at bedside regarding A1c level. Patient reports his brother having Diabetes. Discussed A1c levels and spoke to patient about his A1c being 6.8%. Spoke with patient to have follow up with A1c. Patient reports eating "sweets everyday". Spoke with patient about dietary modifications. Patient asked questions about how to check a glucose and what level his glucose should be. Discussed hypoglycemia with patient.  Thanks,  Tama Headings RN, MSN, BC-ADM Inpatient Diabetes Coordinator Team Pager [phone] (8a-5p)

## 2019-01-09 NOTE — Progress Notes (Signed)
ANTICOAGULATION CONSULT NOTE - Follow-Up Consult  Pharmacy Consult for heparin Indication: apical mural thrombus  No Known Allergies  Patient Measurements: Height: 6' 1.5" (186.7 cm) Weight: 184 lb 9.1 oz (83.7 kg) IBW/kg (Calculated) : 81.05  Heparin Dosing Wt: 83kg  Vital Signs: Temp: 98.5 F (36.9 C) (02/06 0741) Temp Source: Oral (02/06 0741) BP: 97/75 (02/06 0741) Pulse Rate: 90 (02/06 0741)  Labs: Recent Labs    01/06/19 1650 01/06/19 1940 01/07/19 0339  01/07/19 1343 01/08/19 0435 01/08/19 0633 01/08/19 1630 01/09/19 0502 01/09/19 0512  HGB  --  12.0*  --    < > 13.9  --  12.3*  --  11.8*  --   HCT  --  36.9*  --    < > 41.0  --  38.9*  --  36.6*  --   PLT  --  125*  --   --   --   --  128*  --  122*  --   APTT 33  --   --   --   --   --   --   --   --   --   LABPROT  --   --  21.4*  --   --  19.2*  --   --  18.2*  --   INR  --   --  1.88  --   --  1.64  --   --  1.53  --   HEPARINUNFRC  --   --   --   --   --  0.19*  --  0.48  --  0.53  CREATININE 1.49*  --  1.24  --   --  1.26*  --   --  1.13  --    < > = values in this interval not displayed.    Estimated Creatinine Clearance: 62.8 mL/min (by C-G formula based on SCr of 1.13 mg/dL).   Medical History: Past Medical History:  Diagnosis Date  . AICD (automatic cardioverter/defibrillator) present   . Cancer of left kidney ([place])    S/P OR 05/2014  . CHF (congestive heart failure) ([place])   . Coronary artery disease   . DVT (deep venous thrombosis) ([facility]) 1983   LLE  . GERD (gastroesophageal reflux disease)   . History of hiatal hernia   . Hypertension     Medications:  Medications Prior to Admission  Medication Sig Dispense Refill Last Dose  . amiodarone (PACERONE) 200 MG tablet Take 200 mg by mouth daily.   12/29/2018 at am  . aspirin 81 MG chewable tablet Chew 81 mg by mouth.   12/29/2018 at am  . carvedilol (COREG) 12.5 MG tablet Take 6.25 mg by mouth 2 (two) times daily. Taking 1/2 tablet (6.25mg )  twice daily   12/29/2018 at am  . esomeprazole (NEXIUM) 20 MG capsule Take 20 mg by mouth daily as needed (FOR HEARTBURN).   unk at prn  . JARDIANCE 10 MG TABS tablet Take 10 mg by mouth daily.  1 12/29/2018 at Unknown time  . mometasone-formoterol (DULERA) 200-5 MCG/ACT AERO Inhale 2 puffs into the lungs 2 (two) times daily.   12/29/2018 at Unknown time  . Multiple Vitamin (MULTIVITAMIN WITH MINERALS) TABS tablet Take 1 tablet by mouth daily.   12/29/2018 at Unknown time  . omeprazole (PRILOSEC) 20 MG capsule Take 20 mg by mouth daily.   12/29/2018 at Unknown time  . potassium chloride (K-DUR,KLOR-CON) 10 MEQ tablet Take 10 mEq by mouth every other  day.   12/29/2018 at Unknown time  . sucralfate (CARAFATE) 1 g tablet Take 1 tablet (1 g total) by mouth 4 (four) times daily -  with meals and at bedtime. 28 tablet 0 12/29/2018 at Unknown time  . tamsulosin (FLOMAX) 0.4 MG CAPS capsule Take 0.4 mg by mouth.   12/29/2018 at Unknown time  . torsemide (DEMADEX) 20 MG tablet Take 40 mg by mouth 2 (two) times daily.   12/29/2018 at Unknown time  . warfarin (COUMADIN) 5 MG tablet Take 2.5 mg by mouth daily. Take 2.5 mg daily except 5 mg Thursday   12/28/2018 at 1800  . benzonatate (TESSALON) 100 MG capsule Take 1 capsule (100 mg total) by mouth every 8 (eight) hours. (Patient not taking: Reported on 12/30/2018) 21 capsule 0 Not Taking at Unknown time  . carvedilol (COREG) 3.125 MG tablet Take 1 tablet (3.125 mg total) by mouth 2 (two) times daily with a meal. (Patient not taking: Reported on 12/30/2018) 60 tablet 1 Not Taking at Unknown time  . torsemide (DEMADEX) 20 MG tablet Take 1.5 tablet (30mg ) in AM and 1 table (20mg ) in PM daily (Patient not taking: Reported on 12/30/2018) 75 tablet 1 Not Taking at Unknown time   Scheduled:  . ALPRAZolam  0.25 mg Oral Once  . amiodarone  200 mg Oral Daily  . feeding supplement (ENSURE ENLIVE)  237 mL Oral TID BM  . lactobacillus  1 g Oral TID WC  . mouth rinse  15 mL Mouth  Rinse BID  . mometasone-formoterol  2 puff Inhalation BID  . multivitamin with minerals  1 tablet Oral Daily  . pantoprazole  40 mg Oral Daily  . sodium chloride flush  3 mL Intravenous Once  . sodium chloride flush  3 mL Intravenous Q12H  . sodium chloride flush  3 mL Intravenous Q12H  . tamsulosin  0.8 mg Oral QPC supper    Assessment: 7 yoM admitted with volume overload. Pt on warfarin PTA for hx of mural thromus. INR supratherapeutic on admit at 3.69, now down to 1.64 after holding for cath. Pt started on heparin bridge pending LVAD workup.   Heparin level therapeutic this morning, H/H trending down slowly, no S/Sx bleeding noted. INR 1.53 as warfarin has been held.  *Home Dose = 5mg  Thurs, 2.5mg  all other days  Goal of Therapy:  Heparin level 0.3-0.7 units/ml   Plan:  -Continue heparin infusion at 1400 units/hr -Monitor daily heparin level, CBC, s/sx of bleeding  Arrie Senate, PharmD, BCPS Clinical Pharmacist [phone] Please check AMION for all [place] numbers 01/09/2019

## 2019-01-09 NOTE — Progress Notes (Addendum)
Inpatient Diabetes Program Recommendations  AACE/ADA: New Consensus Statement on Inpatient Glycemic Control (2015)  Target Ranges:  Prepandial:   less than 140 mg/dL      Peak postprandial:   less than 180 mg/dL (1-2 hours)      Critically ill patients:  140 - 180 mg/dL   Lab Results  Component Value Date   GLUCAP 108 (H) 05/05/2018   HGBA1C 6.8 (H) 01/06/2019    Review of Glycemic Control  Diabetes history: None noted Current orders for Inpatient glycemic control: None  Inpatient Diabetes Program Recommendations:    A1c 6.8% this admission on 2/3, meeting criteria for new DM diagnosis.  Consider CBGs ACHS. Will see patient today for basic lifstyle modification.  Thanks,  Tama Headings RN, MSN, BC-ADM Inpatient Diabetes Coordinator Team Pager [phone] (8a-5p)

## 2019-01-10 DIAGNOSIS — I513 Intracardiac thrombosis, not elsewhere classified: Secondary | ICD-10-CM

## 2019-01-10 LAB — CBC
HCT: 33.2 % — ABNORMAL LOW (ref 39.0–52.0)
Hemoglobin: 10.7 g/dL — ABNORMAL LOW (ref 13.0–17.0)
MCH: 26 pg (ref 26.0–34.0)
MCHC: 32.2 g/dL (ref 30.0–36.0)
MCV: 80.6 fL (ref 80.0–100.0)
Platelets: 120 10*3/uL — ABNORMAL LOW (ref 150–400)
RBC: 4.12 MIL/uL — ABNORMAL LOW (ref 4.22–5.81)
RDW: 20.9 % — ABNORMAL HIGH (ref 11.5–15.5)
WBC: 7.1 10*3/uL (ref 4.0–10.5)
nRBC: 0.4 % — ABNORMAL HIGH (ref 0.0–0.2)

## 2019-01-10 LAB — BASIC METABOLIC PANEL
Anion gap: 7 (ref 5–15)
BUN: 25 mg/dL — ABNORMAL HIGH (ref 8–23)
CO2: 28 mmol/L (ref 22–32)
Calcium: 8.5 mg/dL — ABNORMAL LOW (ref 8.9–10.3)
Chloride: 100 mmol/L (ref 98–111)
Creatinine, Ser: 1.33 mg/dL — ABNORMAL HIGH (ref 0.61–1.24)
GFR, EST AFRICAN AMERICAN: 59 mL/min — AB (ref >60)
GFR, EST NON AFRICAN AMERICAN: 51 mL/min — AB (ref >60)
Glucose, Bld: 94 mg/dL (ref 70–99)
Potassium: 4.2 mmol/L (ref 3.5–5.1)
Sodium: 135 mmol/L (ref 135–145)

## 2019-01-10 LAB — COOXEMETRY PANEL
Carboxyhemoglobin: 2.4 % — ABNORMAL HIGH (ref 0.5–1.5)
Methemoglobin: 1.1 % (ref 0.0–1.5)
O2 Saturation: 72.9 %
Total hemoglobin: 11.3 g/dL — ABNORMAL LOW (ref 12.0–16.0)

## 2019-01-10 LAB — PROTIME-INR
INR: 1.38
Prothrombin Time: 16.8 seconds — ABNORMAL HIGH (ref 11.4–15.2)

## 2019-01-10 LAB — HEPARIN LEVEL (UNFRACTIONATED): Heparin Unfractionated: 0.51 IU/mL (ref 0.30–0.70)

## 2019-01-10 LAB — MAGNESIUM: Magnesium: 2.1 mg/dL (ref 1.7–2.4)

## 2019-01-10 MED ORDER — FUROSEMIDE 10 MG/ML IJ SOLN
80.0000 mg | Freq: Once | INTRAMUSCULAR | Status: AC
  Administered 2019-01-10: 80 mg via INTRAVENOUS
  Filled 2019-01-10: qty 8

## 2019-01-10 MED ORDER — FUROSEMIDE 10 MG/ML IJ SOLN
80.0000 mg | Freq: Two times a day (BID) | INTRAMUSCULAR | Status: DC
  Administered 2019-01-10 – 2019-01-12 (×4): 80 mg via INTRAVENOUS
  Filled 2019-01-10 (×5): qty 8

## 2019-01-10 MED ORDER — METOLAZONE 5 MG PO TABS
5.0000 mg | ORAL_TABLET | Freq: Once | ORAL | Status: AC
  Administered 2019-01-10: 5 mg via ORAL
  Filled 2019-01-10: qty 1

## 2019-01-10 NOTE — Progress Notes (Signed)
Physical Therapy Treatment Patient Details Name: Lawrnce Rice MRN: [id number] DOB: 1941/03/12 Today's Date: 01/10/2019    History of Present Illness 78yo male with ongoing SOB, chest x-ray showing pulmonary edema and R pleural effusion. Admitted for CHF exacerbation. 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 Pt AMB 328ft in hallway, first half on 2L./min as in prior sessions, but at 87% and SOB halfway. O2 increased to 4L/min and is maintained 92-95% with remainder of AMB back to room. Pt requires more standing rest breaks this date. He reports AMB this date was more difficulty effort wise than any prior PT session this admission. Will continue to follow.   Follow Up Recommendations  Home health PT     Equipment Recommendations  Rolling walker with 5" wheels    Recommendations for Other Services       Precautions / Restrictions Precautions Precautions: Fall Restrictions Weight Bearing Restrictions: No    Mobility  Bed Mobility               General bed mobility comments: OOB in chair upon entry   Transfers   Equipment used: Rolling walker (2 wheeled) Transfers: Sit to/from Stand Sit to Stand: Min assist         General transfer comment: Pt asks for hand held assist to pull self up, legs appear labored.   Ambulation/Gait Ambulation/Gait assistance: Supervision Gait Distance (Feet): 300 Feet Assistive device: Rolling walker (2 wheeled) Gait Pattern/deviations: WFL(Within Functional Limits);Step-through pattern     General Gait Details: on 2L/min 87%, but >92% on 4L/min durign AMB. standing breaks needed   Stairs             Wheelchair Mobility    Modified Rankin (Stroke Patients Only)       Balance                                             Cognition Arousal/Alertness: Awake/alert Behavior During Therapy: WFL for tasks assessed/performed Overall Cognitive Status: Within Functional Limits for tasks assessed                                        Exercises      General Comments        Pertinent Vitals/Pain Pain Assessment: No/denies pain    Home Living                      Prior Function            PT Goals (current goals can now be found in the care plan section) Acute Rehab PT Goals Patient Stated Goal: go home PT Goal Formulation: With patient Time For Goal Achievement: 01/17/19 Potential to Achieve Goals: Good Progress towards PT goals: Not progressing toward goals - comment(AMB more difficult this date, fluid increased since yesterday)    Frequency    Min 3X/week      PT Plan Current plan remains appropriate    Co-evaluation              AM-PAC PT "6 Clicks" Mobility  Outcome Measure  Help needed turning from your back to your side while in a flat bed without using bedrails?: A Little Help needed moving from lying on your back to sitting on the side of a flat bed without using bedrails?: A Little Help needed moving to and from a bed to a chair (including a wheelchair)?: A Little Help needed standing up from a chair using your arms (e.g., wheelchair or bedside chair)?: A Little Help needed to walk in hospital room?: A Little Help needed climbing 3-5 steps with a railing? : A Little 6 Click Score: 18    End of Session Equipment Utilized During Treatment: Oxygen Activity Tolerance: Patient tolerated treatment well;Patient limited by fatigue Patient left: in chair;with call bell/phone within reach Nurse Communication: Mobility status PT Visit Diagnosis: Muscle weakness (generalized) (M62.81);Other abnormalities of gait and mobility (R26.89)     Time: 3524-8185 PT Time Calculation (min) (ACUTE ONLY): 24 min  Charges:  $Therapeutic Exercise: 23-37  mins                     1:18 PM, 01/10/19 Etta Grandchild, PT, DPT Physical Therapist - [place] [phone] (Pager)  [phone] (Office)      Buccola,[name] 01/10/2019, 1:16 PM

## 2019-01-10 NOTE — Progress Notes (Addendum)
Advanced Heart Failure Rounding Note  PCP-Cardiologist: No primary care provider on file.   Subjective:    Remains on milrinone 0.25. Coox 73%. Started on torsemide 40 mg daily yesterday with sluggish diuresis. Weight up 6 lbs (standing). Creatinine trending back up 1.1 -> 1.33. CVP 15. K 4.2, mag 2.1.   INR 1.38. On heparin drip. Coumadin on hold with ongoing VAD work up.   Planning for ?VAD pending MRB discussion and psychosocial assessment on Monday.  Denies CP or SOB. Did not walk in hallways yesterday. No orthopnea.   [facility] 01/07/19  Ost RCA lesion is 50% stenosed.  Mid RCA lesion is 40% stenosed.  Prox LAD lesion is 50% stenosed.  Ost 2nd Diag to 2nd Diag lesion is 100% stenosed.   Findings:  Done on milrinone 0.25 mcg/kg/min  RA = 7  RV = 62/8 PA = 65/26 (41) PCW = 19 Fick cardiac output/index = 5.6/2.7 PVR = 3.9 WU FA sat = 95% PA sat = 65%, 66% PaPI = 5.6  ABG on 2L:  7.50/40/67/95%  Assessment: 1. Mild non-obstructive CAD 2. Moderate PAH with normal PAPI 3. Normal CO on milrinone  ECHO 01/01/2019  EF 10% with severe MR, severe TR, Biatrial enlargement, RV moderately reduced function.   Objective:   Weight Range: 86.4 kg Body mass index is 24.78 kg/m.   Vital Signs:   Temp:  [97.8 F (36.6 C)-99 F (37.2 C)] 98.1 F (36.7 C) (02/07 0849) Pulse Rate:  [90-97] 96 (02/07 0853) Resp:  [16-19] 16 (02/07 0853) BP: (94-105)/(61-75) 99/66 (02/07 0849) SpO2:  [94 %-98 %] 97 % (02/07 0853) Weight:  [86.4 kg] 86.4 kg (02/07 0702) Last BM Date: 01/08/19  Weight change: Filed Weights   01/08/19 0436 01/09/19 0417 01/10/19 0702  Weight: 83.7 kg 83.7 kg 86.4 kg    Intake/Output:   Intake/Output Summary (Last 24 hours) at 01/10/2019 0943 Last data filed at 01/10/2019 0900 Gross per 24 hour  Intake 1254.2 ml  Output 1125 ml  Net 129.2 ml      Physical Exam   General: No resp difficulty. Looks fatigued.  HEENT: Normal Neck: Supple. JVP  15. Carotids 2+ bilat; no bruits. No thyromegaly or nodule noted. Cor: PMI nondisplaced. RRR, 2/6 MR/TR Lungs: CTAB, normal effort. Abdomen: Soft, non-tender, non-distended, no HSM. No bruits or masses. +BS  Extremities: No cyanosis, clubbing, or rash. R and LLE 1+ edema. BLE unna boots. RUE PICC.  Neuro: Alert & orientedx3, cranial nerves grossly intact. moves all 4 extremities w/o difficulty. Affect pleasant  Telemetry   BiV paced 90s. Several 3 beat runs of NSVT. Personally reviewed  EKG    No new tracings.   Labs    CBC Recent Labs    01/09/19 0502 01/10/19 0524  WBC 6.2 7.1  HGB 11.8* 10.7*  HCT 36.6* 33.2*  MCV 80.4 80.6  PLT 122* 915*   Basic Metabolic Panel Recent Labs    01/09/19 0502 01/10/19 0524  NA 137 135  K 4.4 4.2  CL 98 100  CO2 27 28  GLUCOSE 139* 94  BUN 21 25*  CREATININE 1.13 1.33*  CALCIUM 8.9 8.5*  MG 2.4 2.1   Liver Function Tests No results for input(s): AST, ALT, ALKPHOS, BILITOT, PROT, ALBUMIN in the last 72 hours. No results for input(s): LIPASE, AMYLASE in the last 72 hours. Cardiac Enzymes No results for input(s): CKTOTAL, CKMB, CKMBINDEX, TROPONINI in the last 72 hours.  BNP: BNP (last 3 results) Recent Labs  12/04/18 1743 12/28/18 1821 12/29/18 1943  BNP >4,500.0* >4,925.0* >4,500.0*    ProBNP (last 3 results) No results for input(s): PROBNP in the last 8760 hours.   D-Dimer No results for input(s): DDIMER in the last 72 hours. Hemoglobin A1C No results for input(s): HGBA1C in the last 72 hours. Fasting Lipid Panel No results for input(s): CHOL, HDL, LDLCALC, TRIG, CHOLHDL, LDLDIRECT in the last 72 hours. Thyroid Function Tests No results for input(s): TSH, T4TOTAL, T3FREE, THYROIDAB in the last 72 hours.  Invalid input(s): FREET3  Other results:   Imaging    No results found.   Medications:     Scheduled Medications: . ALPRAZolam  0.25 mg Oral Once  . amiodarone  200 mg Oral Daily  . feeding  supplement (ENSURE ENLIVE)  237 mL Oral TID BM  . lactobacillus  1 g Oral TID WC  . mouth rinse  15 mL Mouth Rinse BID  . mometasone-formoterol  2 puff Inhalation BID  . multivitamin with minerals  1 tablet Oral Daily  . pantoprazole  40 mg Oral Daily  . sodium chloride flush  3 mL Intravenous Once  . sodium chloride flush  3 mL Intravenous Q12H  . sodium chloride flush  3 mL Intravenous Q12H  . spironolactone  12.5 mg Oral QHS  . tamsulosin  0.8 mg Oral QPC supper  . torsemide  40 mg Oral Daily    Infusions: . sodium chloride    . sodium chloride    . heparin 1,400 Units/hr (01/10/19 0522)  . 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    Admitted with marked volume overload.   Assessment/Plan   1. A/C Systolic Heart Failure -> cardiogenic shock - due to NICM, LHC 2015 nonobstructive disease. Has Biotronik BiV.   - Initial CO-OX 38% so milrinone started on 1/28. - ECHO 01/01/19 EF 10% with Severe MR/TR Biatrial enlargement RV with moderately reduced function.  - Remains on milrinone 0.25. Co-ox 73%. - CVP 15. Started torsemide 40 mg daily yesterday. Creatinine 1.13 -> 1.33. Give 80 mg IV lasix x1.   - Continue spiro 12.5 mg daily. SBP 90-100s. Will not increase today with rising creatinine.  - Volume status and renal function much improved with milrinone. He is clearly inotrope dependent. He met with VAD team on 1/30 and had long talk about his situation and need for advanced therapies. He initially was unsure if he would want a man-made device to help the heart that God  gave him. Dr Haroldine Laws had a long talk with him and his sons on 2/1 and they want to proceed with VAD w/u.  - R/LHC completed 2/4. CO normal on milrinone support. Moderate PAH with normal PAPi. Mild nonobstuctive CAD. - Planning for potential LVAD. Will make decision at [facility] on Monday. Psychosocial eval on Monday as well.    2. CKD Stage III, creatinine baseline 1.8-2.4  - Creatinine 1.33 today  3. LBBB - stable s/p BiV ICD. No change.    4. COPD on chronic home oxygen - Sats stable on 2 liters oxygen. No change.   5. L Kidney Cancer  -  Had cryoablation in 2015 -Renal function improving with inotropes. Creatinine trending back up back on torsemide. Creatinine 1.33 today  6. PVCs - Started on amiodarone 2019 to suppress PVCs.  - Continue amiodarone 200 mg daily. No change.   7. H/O Mural Thrombus - INR 1.38. On heparin drip per pharmacy. Hold coumadin with VAD w/u.    8. 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 No change.   9. UTI, Klebsiella pneumoniae  - Completed rocephin course. No change.   10. Pulmonary nodule - 6 mm nodule in LUL noted on CT scan. Recommended 6 month repeat non contrast CT. Dr Haroldine Laws discussed with Dr Prescott Gum and okay to move forward with VAD.  11. NSVT - K 4.2, mag 2.1   Length of Stay: [place], NP  9:43 AM  Advanced Heart Failure Team Pager [phone] (M-F; 7a - 4p)  Please contact [facility] for night-coverage after hours (4p -7a ) and weekends on [email or web address]  Patient seen and examined with the above-signed Advanced Practice Provider and/or Housestaff. I personally reviewed laboratory data, imaging studies and relevant notes. I independently examined the patient and formulated the important aspects of the plan. I have edited the note to reflect any of my changes or salient points. I have personally discussed the plan with the patient and/or family.  He remains on milrinone. Torsemide  restarted yesterday. Markedly volume overloaded and symptomatic today with CVP 15. Co-ox stable at 73%. Coughing up some blood. + SOB, orthopnea and PND.   Sitting in chair.  JVP to ear Cor regular +s3 Lungs + crackles Ab soft distended Ext 1-2+ edema   He remains on milrinone. Co-ox stable but volume status much worse. Will diurese aggressively today with lasix 80 IV bid and metolazone. Check CXR in am. Continue milrinone. Remains on heparin. Family meeting on Monday am to discuss VAD.  Glori Bickers, MD  2:47 PM

## 2019-01-10 NOTE — Progress Notes (Signed)
ANTICOAGULATION CONSULT NOTE - Follow-Up Consult  Pharmacy Consult for heparin Indication: apical mural thrombus  No Known Allergies  Patient Measurements: Height: 6' 1.5" (186.7 cm) Weight: 190 lb 6.4 oz (86.4 kg)(black scale) IBW/kg (Calculated) : 81.05  Heparin Dosing Wt: 83kg  Vital Signs: Temp: 98.1 F (36.7 C) (02/07 0849) Temp Source: Oral (02/07 0849) BP: 99/66 (02/07 0849) Pulse Rate: 96 (02/07 0853)  Labs: Recent Labs    01/08/19 0435 01/08/19 5638 01/08/19 1630 01/09/19 0502 01/09/19 0512 01/10/19 0524  HGB  --  12.3*  --  11.8*  --  10.7*  HCT  --  38.9*  --  36.6*  --  33.2*  PLT  --  128*  --  122*  --  120*  LABPROT 19.2*  --   --  18.2*  --  16.8*  INR 1.64  --   --  1.53  --  1.38  HEPARINUNFRC 0.19*  --  0.48  --  0.53 0.51  CREATININE 1.26*  --   --  1.13  --  1.33*    Estimated Creatinine Clearance: 53.4 mL/min (A) (by C-G formula based on SCr of 1.33 mg/dL (H)).   Medical History: Past Medical History:  Diagnosis Date  . AICD (automatic cardioverter/defibrillator) present   . Cancer of left kidney ([place])    S/P OR 05/2014  . CHF (congestive heart failure) ([place])   . Coronary artery disease   . DVT (deep venous thrombosis) ([facility]) 1983   LLE  . GERD (gastroesophageal reflux disease)   . History of hiatal hernia   . Hypertension     Medications:  Medications Prior to Admission  Medication Sig Dispense Refill Last Dose  . amiodarone (PACERONE) 200 MG tablet Take 200 mg by mouth daily.   12/29/2018 at am  . aspirin 81 MG chewable tablet Chew 81 mg by mouth.   12/29/2018 at am  . carvedilol (COREG) 12.5 MG tablet Take 6.25 mg by mouth 2 (two) times daily. Taking 1/2 tablet (6.25mg ) twice daily   12/29/2018 at am  . esomeprazole (NEXIUM) 20 MG capsule Take 20 mg by mouth daily as needed (FOR HEARTBURN).   unk at prn  . JARDIANCE 10 MG TABS tablet Take 10 mg by mouth daily.  1 12/29/2018 at Unknown time  . mometasone-formoterol (DULERA) 200-5  MCG/ACT AERO Inhale 2 puffs into the lungs 2 (two) times daily.   12/29/2018 at Unknown time  . Multiple Vitamin (MULTIVITAMIN WITH MINERALS) TABS tablet Take 1 tablet by mouth daily.   12/29/2018 at Unknown time  . omeprazole (PRILOSEC) 20 MG capsule Take 20 mg by mouth daily.   12/29/2018 at Unknown time  . potassium chloride (K-DUR,KLOR-CON) 10 MEQ tablet Take 10 mEq by mouth every other day.   12/29/2018 at Unknown time  . sucralfate (CARAFATE) 1 g tablet Take 1 tablet (1 g total) by mouth 4 (four) times daily -  with meals and at bedtime. 28 tablet 0 12/29/2018 at Unknown time  . tamsulosin (FLOMAX) 0.4 MG CAPS capsule Take 0.4 mg by mouth.   12/29/2018 at Unknown time  . torsemide (DEMADEX) 20 MG tablet Take 40 mg by mouth 2 (two) times daily.   12/29/2018 at Unknown time  . warfarin (COUMADIN) 5 MG tablet Take 2.5 mg by mouth daily. Take 2.5 mg daily except 5 mg Thursday   12/28/2018 at 1800  . benzonatate (TESSALON) 100 MG capsule Take 1 capsule (100 mg total) by mouth every 8 (eight) hours. (Patient  not taking: Reported on 12/30/2018) 21 capsule 0 Not Taking at Unknown time  . carvedilol (COREG) 3.125 MG tablet Take 1 tablet (3.125 mg total) by mouth 2 (two) times daily with a meal. (Patient not taking: Reported on 12/30/2018) 60 tablet 1 Not Taking at Unknown time  . torsemide (DEMADEX) 20 MG tablet Take 1.5 tablet (30mg ) in AM and 1 table (20mg ) in PM daily (Patient not taking: Reported on 12/30/2018) 75 tablet 1 Not Taking at Unknown time   Scheduled:  . ALPRAZolam  0.25 mg Oral Once  . amiodarone  200 mg Oral Daily  . feeding supplement (ENSURE ENLIVE)  237 mL Oral TID BM  . lactobacillus  1 g Oral TID WC  . mouth rinse  15 mL Mouth Rinse BID  . mometasone-formoterol  2 puff Inhalation BID  . multivitamin with minerals  1 tablet Oral Daily  . pantoprazole  40 mg Oral Daily  . sodium chloride flush  3 mL Intravenous Once  . sodium chloride flush  3 mL Intravenous Q12H  . sodium chloride flush   3 mL Intravenous Q12H  . spironolactone  12.5 mg Oral QHS  . tamsulosin  0.8 mg Oral QPC supper    Assessment: 89 yoM admitted with volume overload. Pt on warfarin PTA for hx of mural thromus. INR supratherapeutic on admit at 3.69, now down to 1.64 after holding for cath. Pt started on heparin bridge pending LVAD workup.   Heparin level therapeutic this morning at 0.51, H/H trending down slowly, no S/Sx bleeding noted. INR 1.38 as warfarin has been held.  *Home Dose = 5mg  Thurs, 2.5mg  all other days  Goal of Therapy:  Heparin level 0.3-0.7 units/ml   Plan:  -Continue heparin infusion at 1400 units/hr -Monitor daily heparin level, CBC, s/sx of bleeding  Arrie Senate, PharmD, BCPS Clinical Pharmacist [phone] Please check AMION for all [place] numbers 01/10/2019

## 2019-01-10 NOTE — Progress Notes (Signed)
Pt not feeling well today. Feels very full. Declined walking. Gave IS and instructions. He was able to do 1750 mL. Will f/u tomorrow. When he feels better, will do 6 min walk test. [id number] Yves Dill CES, ACSM 2:56 PM 01/10/2019

## 2019-01-10 NOTE — Plan of Care (Signed)
  Problem: Clinical Measurements: Goal: Will remain free from infection Outcome: Progressing Note:  No s/s of infection noted.   Problem: Activity: Goal: Capacity to carry out activities will improve Outcome: Progressing Note:  Ambulates in room without difficulty.

## 2019-01-11 ENCOUNTER — Inpatient Hospital Stay (HOSPITAL_COMMUNITY): Payer: Medicare Other

## 2019-01-11 LAB — BASIC METABOLIC PANEL
Anion gap: 10 (ref 5–15)
BUN: 29 mg/dL — ABNORMAL HIGH (ref 8–23)
CHLORIDE: 96 mmol/L — AB (ref 98–111)
CO2: 28 mmol/L (ref 22–32)
Calcium: 8.6 mg/dL — ABNORMAL LOW (ref 8.9–10.3)
Creatinine, Ser: 1.33 mg/dL — ABNORMAL HIGH (ref 0.61–1.24)
GFR calc Af Amer: 59 mL/min — ABNORMAL LOW (ref >60)
GFR, EST NON AFRICAN AMERICAN: 51 mL/min — AB (ref >60)
Glucose, Bld: 88 mg/dL (ref 70–99)
POTASSIUM: 4.2 mmol/L (ref 3.5–5.1)
Sodium: 134 mmol/L — ABNORMAL LOW (ref 135–145)

## 2019-01-11 LAB — HEPARIN LEVEL (UNFRACTIONATED): Heparin Unfractionated: 0.36 IU/mL (ref 0.30–0.70)

## 2019-01-11 LAB — COOXEMETRY PANEL
Carboxyhemoglobin: 2.2 % — ABNORMAL HIGH (ref 0.5–1.5)
Methemoglobin: 1.6 % — ABNORMAL HIGH (ref 0.0–1.5)
O2 Saturation: 58.1 %
Total hemoglobin: 10.7 g/dL — ABNORMAL LOW (ref 12.0–16.0)

## 2019-01-11 LAB — PROTIME-INR
INR: 1.29
Prothrombin Time: 15.9 seconds — ABNORMAL HIGH (ref 11.4–15.2)

## 2019-01-11 LAB — CBC
HEMATOCRIT: 32.9 % — AB (ref 39.0–52.0)
Hemoglobin: 10.8 g/dL — ABNORMAL LOW (ref 13.0–17.0)
MCH: 26.3 pg (ref 26.0–34.0)
MCHC: 32.8 g/dL (ref 30.0–36.0)
MCV: 80 fL (ref 80.0–100.0)
Platelets: 118 10*3/uL — ABNORMAL LOW (ref 150–400)
RBC: 4.11 MIL/uL — ABNORMAL LOW (ref 4.22–5.81)
RDW: 20.9 % — ABNORMAL HIGH (ref 11.5–15.5)
WBC: 9.2 10*3/uL (ref 4.0–10.5)
nRBC: 0.5 % — ABNORMAL HIGH (ref 0.0–0.2)

## 2019-01-11 LAB — MAGNESIUM: MAGNESIUM: 2.2 mg/dL (ref 1.7–2.4)

## 2019-01-11 MED ORDER — METOLAZONE 5 MG PO TABS
2.5000 mg | ORAL_TABLET | Freq: Once | ORAL | Status: AC
  Administered 2019-01-11: 2.5 mg via ORAL
  Filled 2019-01-11: qty 1

## 2019-01-11 NOTE — Progress Notes (Signed)
ANTICOAGULATION CONSULT NOTE - Follow-Up Consult  Pharmacy Consult for heparin Indication: apical mural thrombus  No Known Allergies  Patient Measurements: Height: 6' 1.5" (186.7 cm) Weight: 191 lb 8 oz (86.9 kg) IBW/kg (Calculated) : 81.05  Heparin Dosing Wt: 83kg  Vital Signs: Temp: 100.2 F (37.9 C) (02/08 0424) Temp Source: Oral (02/08 0424) BP: 104/69 (02/08 0424) Pulse Rate: 97 (02/08 0424)  Labs: Recent Labs    01/09/19 0502 01/09/19 0512 01/10/19 0524 01/11/19 0354 01/11/19 0358  HGB 11.8*  --  10.7* 10.8*  --   HCT 36.6*  --  33.2* 32.9*  --   PLT 122*  --  120* 118*  --   LABPROT 18.2*  --  16.8* 15.9*  --   INR 1.53  --  1.38 1.29  --   HEPARINUNFRC  --  0.53 0.51  --  0.36  CREATININE 1.13  --  1.33* 1.33*  --     Estimated Creatinine Clearance: 53.4 mL/min (A) (by C-G formula based on SCr of 1.33 mg/dL (H)).   Medical History: Past Medical History:  Diagnosis Date  . AICD (automatic cardioverter/defibrillator) present   . Cancer of left kidney ([place])    S/P OR 05/2014  . CHF (congestive heart failure) ([place])   . Coronary artery disease   . DVT (deep venous thrombosis) ([facility]) 1983   LLE  . GERD (gastroesophageal reflux disease)   . History of hiatal hernia   . Hypertension     Medications:  Medications Prior to Admission  Medication Sig Dispense Refill Last Dose  . amiodarone (PACERONE) 200 MG tablet Take 200 mg by mouth daily.   12/29/2018 at am  . aspirin 81 MG chewable tablet Chew 81 mg by mouth.   12/29/2018 at am  . carvedilol (COREG) 12.5 MG tablet Take 6.25 mg by mouth 2 (two) times daily. Taking 1/2 tablet (6.25mg ) twice daily   12/29/2018 at am  . esomeprazole (NEXIUM) 20 MG capsule Take 20 mg by mouth daily as needed (FOR HEARTBURN).   unk at prn  . JARDIANCE 10 MG TABS tablet Take 10 mg by mouth daily.  1 12/29/2018 at Unknown time  . mometasone-formoterol (DULERA) 200-5 MCG/ACT AERO Inhale 2 puffs into the lungs 2 (two) times daily.    12/29/2018 at Unknown time  . Multiple Vitamin (MULTIVITAMIN WITH MINERALS) TABS tablet Take 1 tablet by mouth daily.   12/29/2018 at Unknown time  . omeprazole (PRILOSEC) 20 MG capsule Take 20 mg by mouth daily.   12/29/2018 at Unknown time  . potassium chloride (K-DUR,KLOR-CON) 10 MEQ tablet Take 10 mEq by mouth every other day.   12/29/2018 at Unknown time  . sucralfate (CARAFATE) 1 g tablet Take 1 tablet (1 g total) by mouth 4 (four) times daily -  with meals and at bedtime. 28 tablet 0 12/29/2018 at Unknown time  . tamsulosin (FLOMAX) 0.4 MG CAPS capsule Take 0.4 mg by mouth.   12/29/2018 at Unknown time  . torsemide (DEMADEX) 20 MG tablet Take 40 mg by mouth 2 (two) times daily.   12/29/2018 at Unknown time  . warfarin (COUMADIN) 5 MG tablet Take 2.5 mg by mouth daily. Take 2.5 mg daily except 5 mg Thursday   12/28/2018 at 1800  . benzonatate (TESSALON) 100 MG capsule Take 1 capsule (100 mg total) by mouth every 8 (eight) hours. (Patient not taking: Reported on 12/30/2018) 21 capsule 0 Not Taking at Unknown time  . carvedilol (COREG) 3.125 MG tablet Take 1  tablet (3.125 mg total) by mouth 2 (two) times daily with a meal. (Patient not taking: Reported on 12/30/2018) 60 tablet 1 Not Taking at Unknown time  . torsemide (DEMADEX) 20 MG tablet Take 1.5 tablet (30mg ) in AM and 1 table (20mg ) in PM daily (Patient not taking: Reported on 12/30/2018) 75 tablet 1 Not Taking at Unknown time   Scheduled:  . ALPRAZolam  0.25 mg Oral Once  . amiodarone  200 mg Oral Daily  . feeding supplement (ENSURE ENLIVE)  237 mL Oral TID BM  . furosemide  80 mg Intravenous BID  . lactobacillus  1 g Oral TID WC  . mouth rinse  15 mL Mouth Rinse BID  . metolazone  2.5 mg Oral Once  . mometasone-formoterol  2 puff Inhalation BID  . multivitamin with minerals  1 tablet Oral Daily  . pantoprazole  40 mg Oral Daily  . sodium chloride flush  3 mL Intravenous Once  . sodium chloride flush  3 mL Intravenous Q12H  . sodium chloride  flush  3 mL Intravenous Q12H  . spironolactone  12.5 mg Oral QHS  . tamsulosin  0.8 mg Oral QPC supper    Assessment: 38 yoM admitted with volume overload. Pt on warfarin PTA for hx of mural thromus. INR supratherapeutic on admit at 3.69, now down to 1.64 after holding for cath. Pt started on heparin bridge pending LVAD workup.  -Heparin level therapeutic this morning at 00.36   Goal of Therapy:  Heparin level 0.3-0.7 units/ml   Plan:  -Continue heparin infusion at 1400 units/hr -Monitor daily heparin level, CBC  Hildred Laser, PharmD Clinical Pharmacist **Pharmacist phone directory can now be found on [email or web address] ([email or web address]).  Listed under [place].

## 2019-01-11 NOTE — Progress Notes (Signed)
Patient ID: Spencer Rice, male   DOB: 1941/03/10, 78 y.o.   MRN: [id number]     Advanced Heart Failure Rounding Note  PCP-Cardiologist: No primary care provider on file.   Subjective:    Remains on milrinone 0.25. Coox 58%.  IV Lasix started back yesterday with volume overload.  CVP 13 on my measure this morning.   INR 1.38. On heparin drip. Coumadin on hold with ongoing VAD work up.   Fatigued.  Not walking much.  No short of breath at rest.   [facility] 01/07/19  Ost RCA lesion is 50% stenosed.  Mid RCA lesion is 40% stenosed.  Prox LAD lesion is 50% stenosed.  Ost 2nd Diag to 2nd Diag lesion is 100% stenosed.   Findings:  Done on milrinone 0.25 mcg/kg/min  RA = 7  RV = 62/8 PA = 65/26 (41) PCW = 19 Fick cardiac output/index = 5.6/2.7 PVR = 3.9 WU FA sat = 95% PA sat = 65%, 66% PaPI = 5.6  ABG on 2L:  7.50/40/67/95%  Assessment: 1. Mild non-obstructive CAD 2. Moderate PAH with normal PAPI 3. Normal CO on milrinone  ECHO 01/01/2019  EF 10% with severe Spencer, severe TR, Biatrial enlargement, RV moderately reduced function.   Objective:   Weight Range: 86.9 kg Body mass index is 24.92 kg/m.   Vital Signs:   Temp:  [98.7 F (37.1 C)-100.2 F (37.9 C)] 100.2 F (37.9 C) (02/08 0424) Pulse Rate:  [95-97] 97 (02/08 0424) Resp:  [16-20] 16 (02/08 0424) BP: (95-104)/(64-69) 104/69 (02/08 0424) SpO2:  [87 %-99 %] 93 % (02/08 0813) FiO2 (%):  [21 %] 21 % (02/08 0813) Weight:  [86.9 kg] 86.9 kg (02/08 0424) Last BM Date: 01/08/19  Weight change: Filed Weights   01/09/19 0417 01/10/19 0702 01/11/19 0424  Weight: 83.7 kg 86.4 kg 86.9 kg    Intake/Output:   Intake/Output Summary (Last 24 hours) at 01/11/2019 1033 Last data filed at 01/11/2019 0600 Gross per 24 hour  Intake 1224 ml  Output 2690 ml  Net -1466 ml      Physical Exam   General: NAD Neck: JVP 12, no thyromegaly or thyroid nodule.  Lungs: Clear to auscultation bilaterally with normal  respiratory effort. CV: Lateral PMI.  Heart regular S1/S2, +S3, no murmur.  1+ edema to knees bilaterally.   Abdomen: Soft, nontender, no hepatosplenomegaly, no distention.  Skin: Intact without lesions or rashes.  Neurologic: Alert and oriented x 3.  Psych: Normal affect. Extremities: No clubbing or cyanosis.  HEENT: Normal.    Telemetry   BiV paced 90s. Personally reviewed  EKG    No new tracings.   Labs    CBC Recent Labs    01/10/19 0524 01/11/19 0354  WBC 7.1 9.2  HGB 10.7* 10.8*  HCT 33.2* 32.9*  MCV 80.6 80.0  PLT 120* 573*   Basic Metabolic Panel Recent Labs    01/10/19 0524 01/11/19 0354  NA 135 134*  K 4.2 4.2  CL 100 96*  CO2 28 28  GLUCOSE 94 88  BUN 25* 29*  CREATININE 1.33* 1.33*  CALCIUM 8.5* 8.6*  MG 2.1 2.2   Liver Function Tests No results for input(s): AST, ALT, ALKPHOS, BILITOT, PROT, ALBUMIN in the last 72 hours. No results for input(s): LIPASE, AMYLASE in the last 72 hours. Cardiac Enzymes No results for input(s): CKTOTAL, CKMB, CKMBINDEX, TROPONINI in the last 72 hours.  BNP: BNP (last 3 results) Recent Labs    12/04/18 1743 12/28/18 1821 12/29/18  1943  BNP >4,500.0* >4,925.0* >4,500.0*    ProBNP (last 3 results) No results for input(s): PROBNP in the last 8760 hours.   D-Dimer No results for input(s): DDIMER in the last 72 hours. Hemoglobin A1C No results for input(s): HGBA1C in the last 72 hours. Fasting Lipid Panel No results for input(s): CHOL, HDL, LDLCALC, TRIG, CHOLHDL, LDLDIRECT in the last 72 hours. Thyroid Function Tests No results for input(s): TSH, T4TOTAL, T3FREE, THYROIDAB in the last 72 hours.  Invalid input(s): FREET3  Other results:   Imaging    Dg Chest Port 1 View  Result Date: 01/11/2019 CLINICAL DATA:  78 year old male with shortness of breath EXAM: PORTABLE CHEST 1 VIEW COMPARISON:  Prior chest x-ray 01/06/2019 FINDINGS: Stable marked cardiomegaly. Left subclavian approach biventricular  cardiac rhythm maintenance device with leads projecting over the right atrium, right ventricle and overlying the left ventricle. Right upper extremity PICC is present. Catheter tip well positioned at the superior cavoatrial junction. Atherosclerotic calcifications again noted in the transverse aorta. Slightly increased pulmonary vascular congestion with diffuse mild interstitial and airspace opacities consistent with mild pulmonary edema. Small right pleural effusion. No pneumothorax. IMPRESSION: 1. Mild CHF with interstitial pulmonary edema. 2. Stable massive cardiomegaly. 3. Small layering right pleural effusion. 4. Well-positioned right upper extremity PICC. Electronically Signed   By: Jacqulynn Cadet M.D.   On: 01/11/2019 10:00     Medications:     Scheduled Medications: . ALPRAZolam  0.25 mg Oral Once  . amiodarone  200 mg Oral Daily  . feeding supplement (ENSURE ENLIVE)  237 mL Oral TID BM  . furosemide  80 mg Intravenous BID  . lactobacillus  1 g Oral TID WC  . mouth rinse  15 mL Mouth Rinse BID  . metolazone  2.5 mg Oral Once  . mometasone-formoterol  2 puff Inhalation BID  . multivitamin with minerals  1 tablet Oral Daily  . pantoprazole  40 mg Oral Daily  . sodium chloride flush  3 mL Intravenous Once  . sodium chloride flush  3 mL Intravenous Q12H  . sodium chloride flush  3 mL Intravenous Q12H  . spironolactone  12.5 mg Oral QHS  . tamsulosin  0.8 mg Oral QPC supper    Infusions: . sodium chloride    . sodium chloride    . heparin 1,400 Units/hr (01/11/19 0109)  . 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    Admitted with marked volume overload.   Assessment/Plan   1. A/C Systolic Heart Failure -> cardiogenic shock: Due to primarily to nonischemic cardiomyopathy (out of proportion to CAD). [place] 2015 with moderate disease. Has Biotronik BiV ICD.  Initial CO-OX 38% so milrinone started on 1/28. ECHO 01/01/19 EF 10% with severe Spencer/TR, biatrial enlargement, RV with moderately reduced function. Today, co-ox 58% with CVP 13, volume overloaded on exam.  Creatinine stable 1.33.  - Lasix 80 mg IV bid again today, will give a dose of metolazone 2.5 x 1.   - Continue spiro 12.5 mg daily.  - Undergoing workup for possible LVAD.  Surgeon will need to see. Advanced age and baseline  COPD will make this higher risk.  2. CKD Stage III, creatinine baseline 1.8-2.4.  Creatinine 1.33 today.  3. COPD on chronic home oxygen: He will need PFTs when better diuresed to assess degree of lung damage.  4. L Kidney Cancer: Had cryoablation in 2015 5. PVCs: Started on amiodarone 2019 to suppress PVCs.  - Continue amiodarone 200 mg daily. No change.  6. H/O LV Mural Thrombus - On heparin drip per pharmacy. Hold coumadin with VAD w/u.   7. Mitral Regurgitation/Tricuspid Regurgitation: Severe Spencer/TR on ECHO 01/01/19.  8. UTI, Klebsiella pneumoniae  - Completed rocephin course. No change.  9. Pulmonary nodule: 6 mm nodule in LUL noted on CT scan. Recommended 6 month repeat non contrast CT. Dr Haroldine Laws discussed with Dr Prescott Gum and okay to move forward with VAD workup.  Length of Stay: 39  [name], MD  10:33 AM  Advanced Heart Failure Team Pager [phone] (M-F; 7a - 4p)  Please contact [facility] for night-coverage after hours (4p -7a ) and weekends on [email or web address]

## 2019-01-12 LAB — COOXEMETRY PANEL
Carboxyhemoglobin: 2.3 % — ABNORMAL HIGH (ref 0.5–1.5)
Methemoglobin: 1.4 % (ref 0.0–1.5)
O2 Saturation: 66.6 %
Total hemoglobin: 10 g/dL — ABNORMAL LOW (ref 12.0–16.0)

## 2019-01-12 LAB — PROTIME-INR
INR: 1.34
Prothrombin Time: 16.4 seconds — ABNORMAL HIGH (ref 11.4–15.2)

## 2019-01-12 LAB — CBC
HCT: 31.1 % — ABNORMAL LOW (ref 39.0–52.0)
Hemoglobin: 10.1 g/dL — ABNORMAL LOW (ref 13.0–17.0)
MCH: 26.5 pg (ref 26.0–34.0)
MCHC: 32.5 g/dL (ref 30.0–36.0)
MCV: 81.6 fL (ref 80.0–100.0)
PLATELETS: 123 10*3/uL — AB (ref 150–400)
RBC: 3.81 MIL/uL — ABNORMAL LOW (ref 4.22–5.81)
RDW: 21.4 % — ABNORMAL HIGH (ref 11.5–15.5)
WBC: 8.2 10*3/uL (ref 4.0–10.5)
nRBC: 0.2 % (ref 0.0–0.2)

## 2019-01-12 LAB — HEPARIN LEVEL (UNFRACTIONATED)
Heparin Unfractionated: 0.28 IU/mL — ABNORMAL LOW (ref 0.30–0.70)
Heparin Unfractionated: 0.27 IU/mL — ABNORMAL LOW (ref 0.30–0.70)

## 2019-01-12 LAB — BASIC METABOLIC PANEL
Anion gap: 13 (ref 5–15)
BUN: 35 mg/dL — AB (ref 8–23)
CO2: 29 mmol/L (ref 22–32)
CREATININE: 1.5 mg/dL — AB (ref 0.61–1.24)
Calcium: 8.7 mg/dL — ABNORMAL LOW (ref 8.9–10.3)
Chloride: 91 mmol/L — ABNORMAL LOW (ref 98–111)
GFR calc Af Amer: 51 mL/min — ABNORMAL LOW (ref >60)
GFR calc non Af Amer: 44 mL/min — ABNORMAL LOW (ref >60)
Glucose, Bld: 129 mg/dL — ABNORMAL HIGH (ref 70–99)
Potassium: 3.6 mmol/L (ref 3.5–5.1)
Sodium: 133 mmol/L — ABNORMAL LOW (ref 135–145)

## 2019-01-12 LAB — MAGNESIUM: Magnesium: 2.1 mg/dL (ref 1.7–2.4)

## 2019-01-12 MED ORDER — MAGNESIUM HYDROXIDE 400 MG/5ML PO SUSP
15.0000 mL | Freq: Every day | ORAL | Status: DC | PRN
  Administered 2019-01-12 – 2019-01-24 (×4): 15 mL via ORAL
  Filled 2019-01-12 (×4): qty 30

## 2019-01-12 MED ORDER — TORSEMIDE 20 MG PO TABS
40.0000 mg | ORAL_TABLET | Freq: Two times a day (BID) | ORAL | Status: DC
  Administered 2019-01-12 – 2019-01-13 (×2): 40 mg via ORAL
  Filled 2019-01-12 (×2): qty 2

## 2019-01-12 NOTE — Progress Notes (Signed)
Pt reports 10/10 pain LUQ/LLQ with tenderness endorsed on palpation.  Pt abdomen distended with hypo bs.  Assisted pt to bathroom to attempt BM. He states he had a BM this morning that he strained to pass.  Tylenol given for pain and Dr. Hassell Done notified.   Will continue to monitor pt closely.

## 2019-01-12 NOTE — Progress Notes (Signed)
Patient ID: Spencer Rice, male   DOB: 1940/12/20, 78 y.o.   MRN: [id number]     Advanced Heart Failure Rounding Note  PCP-Cardiologist: No primary care provider on file.   Subjective:    Remains on milrinone 0.25. Coox 67%.  He is on IV Lasix, weight down ?9 lbs.  CVP down to 7.   On heparin drip. Coumadin on hold with ongoing VAD work up.   Breathing better has had LLQ pain, present for several days now.  No fever.   [facility] 01/07/19  Ost RCA lesion is 50% stenosed.  Mid RCA lesion is 40% stenosed.  Prox LAD lesion is 50% stenosed.  Ost 2nd Diag to 2nd Diag lesion is 100% stenosed.   Findings:  Done on milrinone 0.25 mcg/kg/min  RA = 7  RV = 62/8 PA = 65/26 (41) PCW = 19 Fick cardiac output/index = 5.6/2.7 PVR = 3.9 WU FA sat = 95% PA sat = 65%, 66% PaPI = 5.6  ABG on 2L:  7.50/40/67/95%  Assessment: 1. Mild non-obstructive CAD 2. Moderate PAH with normal PAPI 3. Normal CO on milrinone  ECHO 01/01/2019  EF 10% with severe MR, severe TR, Biatrial enlargement, RV moderately reduced function.   Objective:   Weight Range: 82.6 kg Body mass index is 23.71 kg/m.   Vital Signs:   Temp:  [98.3 F (36.8 C)-99.3 F (37.4 C)] 98.4 F (36.9 C) (02/09 0528) Pulse Rate:  [94-103] 94 (02/08 2349) Resp:  [16-25] 18 (02/09 0528) BP: (91-110)/(58-67) 91/67 (02/09 0528) SpO2:  [94 %-100 %] 99 % (02/09 0528) Weight:  [82.6 kg] 82.6 kg (02/09 0600) Last BM Date: 01/11/19  Weight change: Filed Weights   01/10/19 0702 01/11/19 0424 01/12/19 0600  Weight: 86.4 kg 86.9 kg 82.6 kg    Intake/Output:   Intake/Output Summary (Last 24 hours) at 01/12/2019 0900 Last data filed at 01/12/2019 0700 Gross per 24 hour  Intake 970.8 ml  Output 3300 ml  Net -2329.2 ml      Physical Exam   General: NAD Neck: No JVD, no thyromegaly or thyroid nodule.  Lungs: Clear to auscultation bilaterally with normal respiratory effort. CV: Lateral PMI.  Heart regular S1/S2, +S3, no  murmur.  No peripheral edema.   Abdomen: Soft, very mild LLQ tenderness, no hepatosplenomegaly, no distention.  Skin: Intact without lesions or rashes.  Neurologic: Alert and oriented x 3.  Psych: Normal affect. Extremities: No clubbing or cyanosis.  HEENT: Normal.    Telemetry   BiV paced 90s. Personally reviewed  EKG    No new tracings.   Labs    CBC Recent Labs    01/11/19 0354 01/12/19 0530  WBC 9.2 8.2  HGB 10.8* 10.1*  HCT 32.9* 31.1*  MCV 80.0 81.6  PLT 118* 924*   Basic Metabolic Panel Recent Labs    01/11/19 0354 01/12/19 0530  NA 134* 133*  K 4.2 3.6  CL 96* 91*  CO2 28 29  GLUCOSE 88 129*  BUN 29* 35*  CREATININE 1.33* 1.50*  CALCIUM 8.6* 8.7*  MG 2.2 2.1   Liver Function Tests No results for input(s): AST, ALT, ALKPHOS, BILITOT, PROT, ALBUMIN in the last 72 hours. No results for input(s): LIPASE, AMYLASE in the last 72 hours. Cardiac Enzymes No results for input(s): CKTOTAL, CKMB, CKMBINDEX, TROPONINI in the last 72 hours.  BNP: BNP (last 3 results) Recent Labs    12/04/18 1743 12/28/18 1821 12/29/18 1943  BNP >4,500.0* >4,925.0* >4,500.0*    ProBNP (  last 3 results) No results for input(s): PROBNP in the last 8760 hours.   D-Dimer No results for input(s): DDIMER in the last 72 hours. Hemoglobin A1C No results for input(s): HGBA1C in the last 72 hours. Fasting Lipid Panel No results for input(s): CHOL, HDL, LDLCALC, TRIG, CHOLHDL, LDLDIRECT in the last 72 hours. Thyroid Function Tests No results for input(s): TSH, T4TOTAL, T3FREE, THYROIDAB in the last 72 hours.  Invalid input(s): FREET3  Other results:   Imaging    No results found.   Medications:     Scheduled Medications: . ALPRAZolam  0.25 mg Oral Once  . amiodarone  200 mg Oral Daily  . feeding supplement (ENSURE ENLIVE)  237 mL Oral TID BM  . lactobacillus  1 g Oral TID WC  . mouth rinse  15 mL Mouth Rinse BID  . mometasone-formoterol  2 puff Inhalation BID   . multivitamin with minerals  1 tablet Oral Daily  . pantoprazole  40 mg Oral Daily  . sodium chloride flush  3 mL Intravenous Once  . sodium chloride flush  3 mL Intravenous Q12H  . sodium chloride flush  3 mL Intravenous Q12H  . spironolactone  12.5 mg Oral QHS  . tamsulosin  0.8 mg Oral QPC supper  . torsemide  40 mg Oral BID    Infusions: . sodium chloride 250 mL (01/11/19 2356)  . sodium chloride    . heparin 1,500 Units/hr (01/12/19 1194)  . 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    Admitted with marked volume overload.   Assessment/Plan   1. A/C Systolic Heart Failure -> cardiogenic shock: Due to primarily to nonischemic cardiomyopathy (out of proportion to CAD). [place] 2015 with moderate disease. Has Biotronik BiV ICD.  Initial CO-OX 38% so milrinone started on 1/28. ECHO 01/01/19 EF 10% with severe MR/TR, biatrial enlargement, RV with moderately reduced function. Today, co-ox 67% with CVP 7, volume status improved.  Creatinine mildly higher at 1.5.  - Stop IV Lasix and metolazone, will start on torsemide 40 mg po bid.  - Continue spiro 12.5 mg daily.  - Undergoing workup for possible LVAD.  Surgeon will need to see. Advanced age and baseline COPD will make this higher risk.  2. CKD Stage III, creatinine baseline 1.8-2.4.  Creatinine 1.5 today.  3. COPD on chronic home oxygen: He will need PFTs to assess degree of lung damage (ordered).  4. L Kidney Cancer: Had cryoablation in 2015 5. PVCs: Started on amiodarone 2019 to suppress PVCs.  - Continue amiodarone 200 mg  daily. No change.  6. H/O LV Mural Thrombus - On heparin drip per pharmacy. Hold coumadin with VAD w/u.   7. 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  8. UTI, Klebsiella pneumoniae  - Completed rocephin course. No change.  9. Pulmonary nodule: 6 mm nodule in LUL noted on CT scan. Recommended 6 month repeat non contrast CT. Dr Haroldine Laws discussed with Dr Prescott Gum and okay to move forward with VAD workup.  Length of Stay: [place], MD  9:00 AM  Advanced Heart Failure Team Pager [phone]  M-F; 7a - 4p)  Please contact [facility] for night-coverage after hours (4p -7a ) and weekends on [email or web address]

## 2019-01-12 NOTE — Progress Notes (Signed)
ANTICOAGULATION CONSULT NOTE - Follow-Up Consult  Pharmacy Consult for heparin Indication: apical mural thrombus  No Known Allergies  Patient Measurements: Height: 6' 1.5" (186.7 cm) Weight: 182 lb 3.2 oz (82.6 kg) IBW/kg (Calculated) : 81.05  Heparin Dosing Wt: 83kg  Vital Signs: Temp: 97.8 F (36.6 C) (02/09 1445) Temp Source: Oral (02/09 1445) BP: 107/68 (02/09 1445) Pulse Rate: 97 (02/09 1445)  Labs: Recent Labs    01/10/19 0524 01/11/19 0354 01/11/19 0358 01/12/19 0530 01/12/19 1423  HGB 10.7* 10.8*  --  10.1*  --   HCT 33.2* 32.9*  --  31.1*  --   PLT 120* 118*  --  123*  --   LABPROT 16.8* 15.9*  --  16.4*  --   INR 1.38 1.29  --  1.34  --   HEPARINUNFRC 0.51  --  0.36 0.28* 0.27*  CREATININE 1.33* 1.33*  --  1.50*  --     Estimated Creatinine Clearance: 47.3 mL/min (A) (by C-G formula based on SCr of 1.5 mg/dL (H)).   Medical History: Past Medical History:  Diagnosis Date  . AICD (automatic cardioverter/defibrillator) present   . Cancer of left kidney ([place])    S/P OR 05/2014  . CHF (congestive heart failure) ([place])   . Coronary artery disease   . DVT (deep venous thrombosis) ([facility]) 1983   LLE  . GERD (gastroesophageal reflux disease)   . History of hiatal hernia   . Hypertension     Medications:  Medications Prior to Admission  Medication Sig Dispense Refill Last Dose  . amiodarone (PACERONE) 200 MG tablet Take 200 mg by mouth daily.   12/29/2018 at am  . aspirin 81 MG chewable tablet Chew 81 mg by mouth.   12/29/2018 at am  . carvedilol (COREG) 12.5 MG tablet Take 6.25 mg by mouth 2 (two) times daily. Taking 1/2 tablet (6.25mg ) twice daily   12/29/2018 at am  . esomeprazole (NEXIUM) 20 MG capsule Take 20 mg by mouth daily as needed (FOR HEARTBURN).   unk at prn  . JARDIANCE 10 MG TABS tablet Take 10 mg by mouth daily.  1 12/29/2018 at Unknown time  . mometasone-formoterol (DULERA) 200-5 MCG/ACT AERO Inhale 2 puffs into the lungs 2 (two) times daily.    12/29/2018 at Unknown time  . Multiple Vitamin (MULTIVITAMIN WITH MINERALS) TABS tablet Take 1 tablet by mouth daily.   12/29/2018 at Unknown time  . omeprazole (PRILOSEC) 20 MG capsule Take 20 mg by mouth daily.   12/29/2018 at Unknown time  . potassium chloride (K-DUR,KLOR-CON) 10 MEQ tablet Take 10 mEq by mouth every other day.   12/29/2018 at Unknown time  . sucralfate (CARAFATE) 1 g tablet Take 1 tablet (1 g total) by mouth 4 (four) times daily -  with meals and at bedtime. 28 tablet 0 12/29/2018 at Unknown time  . tamsulosin (FLOMAX) 0.4 MG CAPS capsule Take 0.4 mg by mouth.   12/29/2018 at Unknown time  . torsemide (DEMADEX) 20 MG tablet Take 40 mg by mouth 2 (two) times daily.   12/29/2018 at Unknown time  . warfarin (COUMADIN) 5 MG tablet Take 2.5 mg by mouth daily. Take 2.5 mg daily except 5 mg Thursday   12/28/2018 at 1800  . benzonatate (TESSALON) 100 MG capsule Take 1 capsule (100 mg total) by mouth every 8 (eight) hours. (Patient not taking: Reported on 12/30/2018) 21 capsule 0 Not Taking at Unknown time  . carvedilol (COREG) 3.125 MG tablet Take 1 tablet (3.125  mg total) by mouth 2 (two) times daily with a meal. (Patient not taking: Reported on 12/30/2018) 60 tablet 1 Not Taking at Unknown time  . torsemide (DEMADEX) 20 MG tablet Take 1.5 tablet (30mg ) in AM and 1 table (20mg ) in PM daily (Patient not taking: Reported on 12/30/2018) 75 tablet 1 Not Taking at Unknown time   Scheduled:  . ALPRAZolam  0.25 mg Oral Once  . amiodarone  200 mg Oral Daily  . feeding supplement (ENSURE ENLIVE)  237 mL Oral TID BM  . lactobacillus  1 g Oral TID WC  . mouth rinse  15 mL Mouth Rinse BID  . mometasone-formoterol  2 puff Inhalation BID  . multivitamin with minerals  1 tablet Oral Daily  . pantoprazole  40 mg Oral Daily  . sodium chloride flush  3 mL Intravenous Once  . sodium chloride flush  3 mL Intravenous Q12H  . sodium chloride flush  3 mL Intravenous Q12H  . spironolactone  12.5 mg Oral QHS  .  tamsulosin  0.8 mg Oral QPC supper  . torsemide  40 mg Oral BID    Assessment: 78 yoM admitted with volume overload. Pt on warfarin PTA for hx of mural thromus. INR supratherapeutic on admit at 3.69, now down to 1.64 after holding for cath. Pt started on heparin bridge pending LVAD workup.  -Heparin level therapeutic remains low after increase to 1500 units/hr   Goal of Therapy:  Heparin level 0.3-0.7 units/ml   Plan:  -Increase heparin to 1600 units/hr -Monitor daily heparin level, CBC  Hildred Laser, PharmD Clinical Pharmacist **Pharmacist phone directory can now be found on [email or web address] ([email or web address]).  Listed under [place].

## 2019-01-12 NOTE — Progress Notes (Signed)
ANTICOAGULATION CONSULT NOTE - Follow Up Consult  Pharmacy Consult for heparin Indication: apical mural thrombus  Labs: Recent Labs    01/10/19 0524 01/11/19 0354 01/11/19 0358 01/12/19 0530  HGB 10.7* 10.8*  --   --   HCT 33.2* 32.9*  --   --   PLT 120* 118*  --   --   LABPROT 16.8* 15.9*  --  16.4*  INR 1.38 1.29  --  1.34  HEPARINUNFRC 0.51  --  0.36 0.28*  CREATININE 1.33* 1.33*  --  1.50*    Assessment: 78yo male now subtherapeutic on heparin though had been trending down; no gtt issues or signs of bleeding per RN.  Goal of Therapy:  Heparin level 0.3-0.7 units/ml   Plan:  Will increase heparin gtt by 1-2 units/kg/hr to 1500 units/hr and check level in 8 hours.    Wynona Neat, PharmD, BCPS  01/12/2019,6:29 AM

## 2019-01-12 NOTE — Progress Notes (Signed)
Patient ID: Spencer Rice, male   DOB: 03-04-1941, 78 y.o.   MRN: [id number]  This NP visited patient at the bedside as a follow up with patient for palliative medicine needs and emotional support.  Patient is out of bed in the chair and eating breakfast.  Verbalizes no complaints or concerns at this time. He anticipates LVAD placement sometime in the near future.  Wanted me to let Kennyth Lose the social worker with the heart failure team aware that his wife could not meet until noon tomorrow.  I left Kennyth Lose a voicemail  Discussed with patient the importance of continued conversation with his  family and their  medical providers regarding overall plan of care and treatment options,  ensuring decisions are within the context of the patients values and GOCs.  Questions and concerns addressed   Left message for Jackie/LCSW  Total time spent on the unit was 15 minutes  Greater than 50% of the time was spent in counseling and coordination of care  Wadie Lessen NP  Palliative Medicine Team Team Phone # [phone] Pager [phone]

## 2019-01-13 ENCOUNTER — Inpatient Hospital Stay (HOSPITAL_COMMUNITY): Payer: Medicare Other

## 2019-01-13 ENCOUNTER — Encounter (HOSPITAL_COMMUNITY): Payer: Medicare Other

## 2019-01-13 LAB — BASIC METABOLIC PANEL
Anion gap: 12 (ref 5–15)
BUN: 44 mg/dL — ABNORMAL HIGH (ref 8–23)
CO2: 30 mmol/L (ref 22–32)
Calcium: 8.6 mg/dL — ABNORMAL LOW (ref 8.9–10.3)
Chloride: 91 mmol/L — ABNORMAL LOW (ref 98–111)
Creatinine, Ser: 1.71 mg/dL — ABNORMAL HIGH (ref 0.61–1.24)
GFR calc non Af Amer: 38 mL/min — ABNORMAL LOW (ref >60)
GFR, EST AFRICAN AMERICAN: 44 mL/min — AB (ref >60)
Glucose, Bld: 158 mg/dL — ABNORMAL HIGH (ref 70–99)
Potassium: 3.9 mmol/L (ref 3.5–5.1)
Sodium: 133 mmol/L — ABNORMAL LOW (ref 135–145)

## 2019-01-13 LAB — CBC
HCT: 24.8 % — ABNORMAL LOW (ref 39.0–52.0)
HCT: 27.4 % — ABNORMAL LOW (ref 39.0–52.0)
HCT: 21.9 % — ABNORMAL LOW (ref 39.0–52.0)
Hemoglobin: 8.2 g/dL — ABNORMAL LOW (ref 13.0–17.0)
Hemoglobin: 9.5 g/dL — ABNORMAL LOW (ref 13.0–17.0)
Hemoglobin: 7.2 g/dL — ABNORMAL LOW (ref 13.0–17.0)
MCH: 26.7 pg (ref 26.0–34.0)
MCH: 27.9 pg (ref 26.0–34.0)
MCH: 26.9 pg (ref 26.0–34.0)
MCHC: 33.1 g/dL (ref 30.0–36.0)
MCHC: 34.7 g/dL (ref 30.0–36.0)
MCHC: 32.9 g/dL (ref 30.0–36.0)
MCV: 80.8 fL (ref 80.0–100.0)
MCV: 80.4 fL (ref 80.0–100.0)
MCV: 81.7 fL (ref 80.0–100.0)
NRBC: 0.4 % — AB (ref 0.0–0.2)
PLATELETS: 161 10*3/uL (ref 150–400)
Platelets: 140 10*3/uL — ABNORMAL LOW (ref 150–400)
Platelets: 119 10*3/uL — ABNORMAL LOW (ref 150–400)
RBC: 3.07 MIL/uL — AB (ref 4.22–5.81)
RBC: 3.41 MIL/uL — ABNORMAL LOW (ref 4.22–5.81)
RBC: 2.68 MIL/uL — ABNORMAL LOW (ref 4.22–5.81)
RDW: 21.2 % — ABNORMAL HIGH (ref 11.5–15.5)
RDW: 21 % — ABNORMAL HIGH (ref 11.5–15.5)
RDW: 21.2 % — ABNORMAL HIGH (ref 11.5–15.5)
WBC: 9.8 10*3/uL (ref 4.0–10.5)
WBC: 9 10*3/uL (ref 4.0–10.5)
WBC: 11.1 10*3/uL — ABNORMAL HIGH (ref 4.0–10.5)
nRBC: 0.7 % — ABNORMAL HIGH (ref 0.0–0.2)
nRBC: 0.5 % — ABNORMAL HIGH (ref 0.0–0.2)

## 2019-01-13 LAB — SURGICAL PCR SCREEN
MRSA, PCR: NEGATIVE
Staphylococcus aureus: NEGATIVE

## 2019-01-13 LAB — COOXEMETRY PANEL
Carboxyhemoglobin: 2.4 % — ABNORMAL HIGH (ref 0.5–1.5)
Methemoglobin: 1 % (ref 0.0–1.5)
O2 SAT: 62.4 %
Total hemoglobin: 6.8 g/dL — CL (ref 12.0–16.0)

## 2019-01-13 LAB — PROTIME-INR
INR: 1.28
Prothrombin Time: 15.9 seconds — ABNORMAL HIGH (ref 11.4–15.2)

## 2019-01-13 LAB — MAGNESIUM: Magnesium: 2.2 mg/dL (ref 1.7–2.4)

## 2019-01-13 LAB — PREPARE RBC (CROSSMATCH)

## 2019-01-13 LAB — HEPARIN LEVEL (UNFRACTIONATED): Heparin Unfractionated: 0.23 IU/mL — ABNORMAL LOW (ref 0.30–0.70)

## 2019-01-13 MED ORDER — FENTANYL CITRATE (PF) 100 MCG/2ML IJ SOLN
50.0000 ug | Freq: Once | INTRAMUSCULAR | Status: AC
  Administered 2019-01-13: 50 ug via INTRAVENOUS
  Filled 2019-01-13: qty 2

## 2019-01-13 MED ORDER — BOOST / RESOURCE BREEZE PO LIQD CUSTOM
1.0000 | Freq: Two times a day (BID) | ORAL | Status: DC
  Administered 2019-01-14 – 2019-01-29 (×28): 1 via ORAL

## 2019-01-13 MED ORDER — POLYETHYLENE GLYCOL 3350 17 G PO PACK
17.0000 g | PACK | Freq: Every day | ORAL | Status: DC | PRN
  Filled 2019-01-13: qty 1

## 2019-01-13 MED ORDER — SODIUM CHLORIDE 0.9% IV SOLUTION
Freq: Once | INTRAVENOUS | Status: AC
  Administered 2019-01-13: 21:00:00 via INTRAVENOUS

## 2019-01-13 MED ORDER — SODIUM CHLORIDE 0.9 % IV BOLUS: 1000.0000 mL | Freq: Once | INTRAVENOUS | Status: DC

## 2019-01-13 MED ORDER — FENTANYL CITRATE (PF) 100 MCG/2ML IJ SOLN
50.0000 ug | INTRAMUSCULAR | Status: DC | PRN
  Administered 2019-01-13 – 2019-01-19 (×4): 50 ug via INTRAVENOUS
  Filled 2019-01-13 (×4): qty 2

## 2019-01-13 MED ORDER — SODIUM CHLORIDE 0.9 % IV BOLUS
1000.0000 mL | Freq: Once | INTRAVENOUS | Status: AC
  Administered 2019-01-13: 1000 mL via INTRAVENOUS

## 2019-01-13 MED ORDER — SORBITOL 70 % SOLN
30.0000 mL | Status: AC
  Administered 2019-01-13: 30 mL via ORAL
  Filled 2019-01-13: qty 30

## 2019-01-13 MED ORDER — POTASSIUM CHLORIDE CRYS ER 20 MEQ PO TBCR
40.0000 meq | EXTENDED_RELEASE_TABLET | Freq: Once | ORAL | Status: AC
  Administered 2019-01-13: 40 meq via ORAL
  Filled 2019-01-13: qty 2

## 2019-01-13 NOTE — Progress Notes (Addendum)
Patient ID: Spencer Rice, male   DOB: [birth year], 78 y.o.   MRN: [id number]     Advanced Heart Failure Rounding Note  PCP-Cardiologist: No primary care provider on file.   Subjective:    Remains on milrinone 0.25 mcg/kg/min. Coox 62%. Received 80 mg IV lasix x1 yesterday, then restarted torsemide 40 mg BID last night. CVP not working currently, but Therapist, sports got 0 this am. I/O only negative 550 mls. Weight down 4 lbs overnight.   Creatinine worse 1.50 -> 1.71. Already received torsemide this am.   He had severe abdominal pain overnight with left sided pain. He feels constipated. Had a small BM this am.   Hemoglobin 10.1 -> 8.2 this morning. Heparin drip briefly overnight. Denies any bleeding.  Feels poorly this morning. Says he had a rough weekend. Feels constipated. Had a small BM this am. No bleeding. +gas. Family to meet with HF CSW at noon today.   [facility] 01/07/19  Ost RCA lesion is 50% stenosed.  Mid RCA lesion is 40% stenosed.  Prox LAD lesion is 50% stenosed.  Ost 2nd Diag to 2nd Diag lesion is 100% stenosed.   Findings:  Done on milrinone 0.25 mcg/kg/min  RA = 7  RV = 62/8 PA = 65/26 (41) PCW = 19 Fick cardiac output/index = 5.6/2.7 PVR = 3.9 WU FA sat = 95% PA sat = 65%, 66% PaPI = 5.6  ABG on 2L:  7.50/40/67/95%  Assessment: 1. Mild non-obstructive CAD 2. Moderate PAH with normal PAPI 3. Normal CO on milrinone  ECHO 01/01/2019  EF 10% with severe MR, severe TR, Biatrial enlargement, RV moderately reduced function.   Objective:   Weight Range: 81.1 kg Body mass index is 23.28 kg/m.   Vital Signs:   Temp:  [97.8 F (36.6 C)-98.7 F (37.1 C)] 98.7 F (37.1 C) (02/10 0503) Pulse Rate:  [92-100] 100 (02/10 0503) Resp:  [18-19] 19 (02/10 0503) BP: (84-107)/(56-68) 92/56 (02/10 0503) SpO2:  [98 %-100 %] 98 % (02/10 0503) Weight:  [81.1 kg] 81.1 kg (02/10 0503) Last BM Date: 01/12/19  Weight change: Filed Weights   01/11/19 0424 01/12/19 0600  01/13/19 0503  Weight: 86.9 kg 82.6 kg 81.1 kg    Intake/Output:   Intake/Output Summary (Last 24 hours) at 01/13/2019 0952 Last data filed at 01/13/2019 0700 Gross per 24 hour  Intake 1381.8 ml  Output 1850 ml  Net -468.2 ml      Physical Exam   General:  No resp difficulty. HEENT: Normal Neck: Supple. JVP 6-7. Carotids 2+ bilat; no bruits. No thyromegaly or nodule noted. Cor: PMI laterally displaced. RRR, No M/G/R noted Lungs: CTAB, normal effort. Abdomen: Soft, non-tender, non-distended, no HSM. No bruits or masses. +BS  Extremities: No cyanosis, clubbing, or rash. R and LLE no edema. BLE unna boots. Neuro: Alert & orientedx3, cranial nerves grossly intact. moves all 4 extremities w/o difficulty. Affect pleasant  Telemetry   BiV paced 90s. Personally reviewed  EKG    No new tracings.   Labs    CBC Recent Labs    01/12/19 0530 01/13/19 0500  WBC 8.2 9.8  HGB 10.1* 8.2*  HCT 31.1* 24.8*  MCV 81.6 80.8  PLT 123* 967*   Basic Metabolic Panel Recent Labs    01/12/19 0530 01/13/19 0500  NA 133* 133*  K 3.6 3.9  CL 91* 91*  CO2 29 30  GLUCOSE 129* 158*  BUN 35* 44*  CREATININE 1.50* 1.71*  CALCIUM 8.7* 8.6*  MG 2.1 2.2  Liver Function Tests No results for input(s): AST, ALT, ALKPHOS, BILITOT, PROT, ALBUMIN in the last 72 hours. No results for input(s): LIPASE, AMYLASE in the last 72 hours. Cardiac Enzymes No results for input(s): CKTOTAL, CKMB, CKMBINDEX, TROPONINI in the last 72 hours.  BNP: BNP (last 3 results) Recent Labs    12/04/18 1743 12/28/18 1821 12/29/18 1943  BNP >4,500.0* >4,925.0* >4,500.0*    ProBNP (last 3 results) No results for input(s): PROBNP in the last 8760 hours.   D-Dimer No results for input(s): DDIMER in the last 72 hours. Hemoglobin A1C No results for input(s): HGBA1C in the last 72 hours. Fasting Lipid Panel No results for input(s): CHOL, HDL, LDLCALC, TRIG, CHOLHDL, LDLDIRECT in the last 72 hours. Thyroid  Function Tests No results for input(s): TSH, T4TOTAL, T3FREE, THYROIDAB in the last 72 hours.  Invalid input(s): FREET3  Other results:   Imaging    No results found.   Medications:     Scheduled Medications: . ALPRAZolam  0.25 mg Oral Once  . amiodarone  200 mg Oral Daily  . feeding supplement (ENSURE ENLIVE)  237 mL Oral TID BM  . lactobacillus  1 g Oral TID WC  . mouth rinse  15 mL Mouth Rinse BID  . mometasone-formoterol  2 puff Inhalation BID  . multivitamin with minerals  1 tablet Oral Daily  . pantoprazole  40 mg Oral Daily  . sodium chloride flush  3 mL Intravenous Once  . sodium chloride flush  3 mL Intravenous Q12H  . sodium chloride flush  3 mL Intravenous Q12H  . spironolactone  12.5 mg Oral QHS  . tamsulosin  0.8 mg Oral QPC supper  . torsemide  40 mg Oral BID    Infusions: . sodium chloride 250 mL (01/11/19 2356)  . heparin 1,600 Units/hr (01/13/19 0510)  . 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    Admitted with marked volume overload.   Assessment/Plan   1. A/C Systolic Heart Failure -> cardiogenic shock: Due to primarily to nonischemic cardiomyopathy (out of proportion to CAD). [place] 2015 with moderate disease. Has Biotronik BiV ICD.  Initial CO-OX 38% so milrinone started on 1/28. ECHO 01/01/19 EF 10% with severe MR/TR, biatrial enlargement, RV with moderately reduced function.  - Continue mirlinone 0.25 mcg/kg/min. Today, co-ox 62% with CVP 0 on RN check this am (not currently working). Creatinine 1.5 -> 1.72. Hold torsemide. Already  received today.  - Continue spiro 12.5 mg daily.  - Undergoing workup for possible LVAD.  Surgeon will need to see. Advanced age and baseline COPD will make this higher risk.  - HF CSW meeting with family at 24 today to discuss VAD. Will discuss at [facility] today.  2. CKD Stage III, creatinine baseline 1.8-2.4.  Creatinine 1.5 -> 1.72 today. As above, hold torsemide.  3. COPD on chronic home oxygen: He will need PFTs to assess degree of lung damage (ordered). No change.  4. L Kidney Cancer: Had cryoablation in 2015. No change.  5. PVCs: Started on amiodarone 2019 to suppress PVCs.  - Continue amiodarone 200 mg daily. No change.  6.  H/O LV Mural Thrombus - On heparin drip per pharmacy. Hold coumadin with VAD w/u. Heparin drip held briefly with drop in hemoglobin overnight.   7. 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 No change.  8. UTI, Klebsiella pneumoniae  - Completed rocephin course. No change.  9. Pulmonary nodule: 6 mm nodule in LUL noted on CT scan. Recommended 6 month repeat non contrast CT. Dr Haroldine Laws discussed with Dr Prescott Gum and okay to move forward with VAD workup. No change.  10. Abdominal pain/Constipation - Add sorbitol for today.  11. Anemia - Hemoglobin dropped 10.1 -> 8.2 - Heparin held briefly overnight. - No s/s bleeding. Check hemoccult. Recheck CBC and add type and screen.  Length of Stay: [place], NP  9:52 AM  Advanced Heart Failure Team Pager [phone] (M-F; 7a - 4p)  Please contact [facility] for night-coverage after hours (4p -7a ) and weekends on [email or web address]  Agree with above.   He remains on milrinone. Co-ox stable. Volume status ok. Main concern today is acute pain and swelling in left flank with dropping hgb. Sent for stat CT A/P and found to have acute RP bleed  Sitting in chair Uncomfortable.  JVP 6-7 Cor RRR 3/6 MR Lungs CTA Ab soft tender il LLQ with palpable fullness Ext warm no c/c/e  HF  stable this am with inotrope support. However has acute RP bleed on heparin. I have stopped heparin personally (prior to CT). Will move to ICU. Give 1L IVF. Pain control. Needs active T&S. Cycle H/H. VAD w/u on hold for today. Will discuss at [place].   Marland KitchenCRITICAL CARE Performed by: Glori Bickers  Total critical care time: 35 minutes  Critical care time was exclusive of separately billable procedures and treating other patients.  Critical care was necessary to treat or prevent imminent or life-threatening deterioration.  Critical care was time spent personally by me (independent of midlevel providers or residents) on the following activities: development of treatment plan with patient and/or surrogate as well as nursing, discussions with consultants, evaluation of patient's response to treatment, examination of patient, obtaining history from patient or surrogate, ordering and performing treatments and interventions, ordering and review of laboratory studies, ordering and review of radiographic studies, pulse oximetry and re-evaluation of patient's condition.  Glori Bickers, MD  1:36 PM

## 2019-01-13 NOTE — Progress Notes (Signed)
   Called by nurse to report Hgb 7.2. HR and BP stable. I went to see the patient. He is still having left abdominal pain but it is much better than it was this morning. He is no longer having dizziness. He has been up ambulating to the bathroom several times without difficulty per the patient and the nurse. Will continue to monitor.  Pt received 1L IV fluid bolus and may be a little hemodiluted. Repeat H&H at midnight.  Daune Perch, AGNP-C [facility] 01/13/2019  7:32 PM

## 2019-01-13 NOTE — Progress Notes (Signed)
PT Cancellation Note  Patient Details Name: Spencer Rice MRN: [id number] DOB: [birth year]   Cancelled Treatment:     Pt going to ICU - will follow as medically appropriate.   Loyal Buba 01/13/2019, 1:32 PM

## 2019-01-13 NOTE — Progress Notes (Signed)
Pt Hgb on coox 6.8, CBC 8.2, down from 10.1.  Pt slightly tachycardic - low 100s at rest.  SBP remains at pt baseline - 90s Cont to c/o of LUQ/LLQ abd pain and tenderness.  Area is firm to touch.  [place], PA-C notified via amion.

## 2019-01-13 NOTE — Progress Notes (Signed)
CSW met with patient's son Josealberto and wife Shelly Bombard to review caregiver role. CSW explained need for daily dressing changes, frequent clinic visits, 24/7 the first two weeks home from the hospital and possible ADL assistance post hospitalization. Patient's son reports he visits daily and assists with all errands He will take responsibility for dressing changes and assist with transportation needs as well. Patient's wife states she can provide supervision and some assistance with ADL's.and all meal preparation. Family appear very supportive and son states "willing to do whatever is needed to help my parents". Full LVAD Assessment to follow. Raquel Sarna, [place], [place]

## 2019-01-13 NOTE — Progress Notes (Signed)
Patients HGB 7.2, called to [place], [name]. Will repeat CBC at midnight.

## 2019-01-13 NOTE — Progress Notes (Addendum)
CT results show left RP hematoma with some extension into left inguinal hernia. Paged by RN due to patient's worsening pain and dizziness. SBP 100s. CVP 5. HR now up to 120s. No scrotal edema per patient. Pain is unchanged. Discussed with Dr Haroldine Laws. Will give 1 L NS and transfer to ICU.  Georgiana Shore, NP  Agree.  Glori Bickers, MD  1:37 PM

## 2019-01-13 NOTE — Progress Notes (Signed)
ANTICOAGULATION CONSULT NOTE - Follow-Up Consult  Pharmacy Consult for heparin Indication: apical mural thrombus - Hx of  No Known Allergies  Patient Measurements: Height: 6' 1.5" (186.7 cm) Weight: 178 lb 14.4 oz (81.1 kg) IBW/kg (Calculated) : 81.05  Heparin Dosing Wt: 83kg  Vital Signs: Temp: 98.7 F (37.1 C) (02/10 0503) Temp Source: Oral (02/10 0503) BP: 92/56 (02/10 0503) Pulse Rate: 100 (02/10 0503)  Labs: Recent Labs    01/11/19 0354  01/12/19 0530 01/12/19 1423 01/13/19 0500  HGB 10.8*  --  10.1*  --  8.2*  HCT 32.9*  --  31.1*  --  24.8*  PLT 118*  --  123*  --  140*  LABPROT 15.9*  --  16.4*  --  15.9*  INR 1.29  --  1.34  --  1.28  HEPARINUNFRC  --    < > 0.28* 0.27* 0.23*  CREATININE 1.33*  --  1.50*  --  1.71*   < > = values in this interval not displayed.    Estimated Creatinine Clearance: 41.5 mL/min (A) (by C-G formula based on SCr of 1.71 mg/dL (H)).   Medical History: Past Medical History:  Diagnosis Date  . AICD (automatic cardioverter/defibrillator) present   . Cancer of left kidney ([place])    S/P OR 05/2014  . CHF (congestive heart failure) ([place])   . Coronary artery disease   . DVT (deep venous thrombosis) ([facility]) 1983   LLE  . GERD (gastroesophageal reflux disease)   . History of hiatal hernia   . Hypertension     Medications:  Medications Prior to Admission  Medication Sig Dispense Refill Last Dose  . amiodarone (PACERONE) 200 MG tablet Take 200 mg by mouth daily.   12/29/2018 at am  . aspirin 81 MG chewable tablet Chew 81 mg by mouth.   12/29/2018 at am  . carvedilol (COREG) 12.5 MG tablet Take 6.25 mg by mouth 2 (two) times daily. Taking 1/2 tablet (6.25mg ) twice daily   12/29/2018 at am  . esomeprazole (NEXIUM) 20 MG capsule Take 20 mg by mouth daily as needed (FOR HEARTBURN).   unk at prn  . JARDIANCE 10 MG TABS tablet Take 10 mg by mouth daily.  1 12/29/2018 at Unknown time  . mometasone-formoterol (DULERA) 200-5 MCG/ACT AERO Inhale 2  puffs into the lungs 2 (two) times daily.   12/29/2018 at Unknown time  . Multiple Vitamin (MULTIVITAMIN WITH MINERALS) TABS tablet Take 1 tablet by mouth daily.   12/29/2018 at Unknown time  . omeprazole (PRILOSEC) 20 MG capsule Take 20 mg by mouth daily.   12/29/2018 at Unknown time  . potassium chloride (K-DUR,KLOR-CON) 10 MEQ tablet Take 10 mEq by mouth every other day.   12/29/2018 at Unknown time  . sucralfate (CARAFATE) 1 g tablet Take 1 tablet (1 g total) by mouth 4 (four) times daily -  with meals and at bedtime. 28 tablet 0 12/29/2018 at Unknown time  . tamsulosin (FLOMAX) 0.4 MG CAPS capsule Take 0.4 mg by mouth.   12/29/2018 at Unknown time  . torsemide (DEMADEX) 20 MG tablet Take 40 mg by mouth 2 (two) times daily.   12/29/2018 at Unknown time  . warfarin (COUMADIN) 5 MG tablet Take 2.5 mg by mouth daily. Take 2.5 mg daily except 5 mg Thursday   12/28/2018 at 1800  . benzonatate (TESSALON) 100 MG capsule Take 1 capsule (100 mg total) by mouth every 8 (eight) hours. (Patient not taking: Reported on 12/30/2018) 21 capsule 0  Not Taking at Unknown time  . carvedilol (COREG) 3.125 MG tablet Take 1 tablet (3.125 mg total) by mouth 2 (two) times daily with a meal. (Patient not taking: Reported on 12/30/2018) 60 tablet 1 Not Taking at Unknown time  . torsemide (DEMADEX) 20 MG tablet Take 1.5 tablet (30mg ) in AM and 1 table (20mg ) in PM daily (Patient not taking: Reported on 12/30/2018) 75 tablet 1 Not Taking at Unknown time   Scheduled:  . ALPRAZolam  0.25 mg Oral Once  . amiodarone  200 mg Oral Daily  . feeding supplement (ENSURE ENLIVE)  237 mL Oral TID BM  . fentaNYL (SUBLIMAZE) injection  50 mcg Intravenous Once  . lactobacillus  1 g Oral TID WC  . mouth rinse  15 mL Mouth Rinse BID  . mometasone-formoterol  2 puff Inhalation BID  . multivitamin with minerals  1 tablet Oral Daily  . pantoprazole  40 mg Oral Daily  . sodium chloride flush  3 mL Intravenous Once  . sodium chloride flush  3 mL  Intravenous Q12H  . sodium chloride flush  3 mL Intravenous Q12H  . sorbitol  30 mL Oral NOW  . spironolactone  12.5 mg Oral QHS  . tamsulosin  0.8 mg Oral QPC supper    Assessment: 56 yoM admitted with volume overload. Pt on warfarin PTA for hx of mural thromus. INR supratherapeutic on admit at 3.69, now down to 1.28 after holding. Pt started on heparin bridge pending LVAD workup.  -Heparin level 0.23 this am < goal on heparin drip rate 1600 uts/hr. Drop in h/h, firm abdomen - possible bleed - recheck cbc, ab CT and hold heparin at this time.     Goal of Therapy:  Heparin level 0.3-0.7 units/ml   Plan:  Holding heparin for now  F/u CT scan results   Bonnita Nasuti Pharm.D. CPP, BCPS Clinical Pharmacist [phone] 01/13/2019 11:15 AM

## 2019-01-13 NOTE — Progress Notes (Signed)
Orthopedic Tech Progress Note Patient Details:  Spencer Rice Jan 03, 1941 [id number]  Ortho Devices Type of Ortho Device: Louretta Parma boot Ortho Device/Splint Interventions: Ordered, Application, Adjustment   Post Interventions Patient Tolerated: Well Instructions Provided: Adjustment of device, Care of device   Yomara Toothman J Ardene Remley 01/13/2019, 3:38 PM

## 2019-01-13 NOTE — Progress Notes (Addendum)
Nutrition Follow-up  DOCUMENTATION CODES:   Non-severe (moderate) malnutrition in context of chronic illness  INTERVENTION:    D/C Ensure Enlive TID.  Add Boost Breeze po BID, each supplement provides 250 kcal and 9 grams of protein  Continue bedtime snack daily  NUTRITION DIAGNOSIS:   Moderate Malnutrition related to chronic illness(CHF, COPD, CAD) as evidenced by moderate muscle depletion, moderate fat depletion.  Ongoing  GOAL:   Patient will meet greater than or equal to 90% of their needs  Progressing   MONITOR:   PO intake, Supplement acceptance, Skin  ASSESSMENT:   78 yo male with PMH of CAD, COPD, DM2, CHF, AICD, HTN, GERD who was admitted with SOB r/t CHF exacerbation.  LVAD evaluation ongoing. Patient transferred to the ICU earlier today with retroperitoneal hematoma. He does not like the Ensure supplements. Agreed to try Boost Breeze instead. Intake of meals is variable, but mostly consuming 50-100% of meals. Labs and medications reviewed.  Sodium 133 (L)  Diet Order:   Diet Order            Diet Heart Room service appropriate? Yes; Fluid consistency: Thin  Diet effective now              EDUCATION NEEDS:   Education needs have been addressed  Skin:  Skin Assessment: Reviewed RN Assessment  Last BM:  2/9  Height:   Ht Readings from Last 1 Encounters:  12/30/18 6' 1.5" (1.867 m)    Weight:   Wt Readings from Last 1 Encounters:  01/13/19 81.1 kg    Ideal Body Weight:  85 kg  BMI:  Body mass index is 23.28 kg/m.  Estimated Nutritional Needs:   Kcal:  2100-2400  Protein:  115-130 gm  Fluid:  2 L    Molli Barrows, RD, LDN, [place] Pager [phone] After Hours Pager [phone]

## 2019-01-13 NOTE — Progress Notes (Signed)
CHF team text paged regarding pt CT scan / It has resulted. Also updated pt is currently c/o dizziness and a continuing Left quadrant  abdominal pain at 10/10. Per pt it is more than 10/10.BP 101/62, HR 120's, O2 saturation at 98  On 2 LPM per nasal cannula. Will continue to monitor.

## 2019-01-14 LAB — COOXEMETRY PANEL
Carboxyhemoglobin: 2.4 % — ABNORMAL HIGH (ref 0.5–1.5)
Methemoglobin: 1.6 % — ABNORMAL HIGH (ref 0.0–1.5)
O2 Saturation: 66.3 %
TOTAL HEMOGLOBIN: 9.4 g/dL — AB (ref 12.0–16.0)

## 2019-01-14 LAB — LUPUS ANTICOAGULANT PANEL
DRVVT: 37.5 s (ref 0.0–47.0)
PTT Lupus Anticoagulant: 36.8 s (ref 0.0–51.9)

## 2019-01-14 LAB — COMPREHENSIVE METABOLIC PANEL
ALT: 22 U/L (ref 0–44)
AST: 54 U/L — ABNORMAL HIGH (ref 15–41)
Albumin: 2.5 g/dL — ABNORMAL LOW (ref 3.5–5.0)
Alkaline Phosphatase: 74 U/L (ref 38–126)
Anion gap: 14 (ref 5–15)
BILIRUBIN TOTAL: 5.3 mg/dL — AB (ref 0.3–1.2)
BUN: 47 mg/dL — ABNORMAL HIGH (ref 8–23)
CO2: 30 mmol/L (ref 22–32)
Calcium: 8.8 mg/dL — ABNORMAL LOW (ref 8.9–10.3)
Chloride: 90 mmol/L — ABNORMAL LOW (ref 98–111)
Creatinine, Ser: 1.72 mg/dL — ABNORMAL HIGH (ref 0.61–1.24)
GFR calc Af Amer: 43 mL/min — ABNORMAL LOW (ref >60)
GFR calc non Af Amer: 38 mL/min — ABNORMAL LOW (ref >60)
Glucose, Bld: 161 mg/dL — ABNORMAL HIGH (ref 70–99)
Potassium: 4.4 mmol/L (ref 3.5–5.1)
Sodium: 134 mmol/L — ABNORMAL LOW (ref 135–145)
TOTAL PROTEIN: 6.3 g/dL — AB (ref 6.5–8.1)

## 2019-01-14 LAB — CBC
HCT: 25.8 % — ABNORMAL LOW (ref 39.0–52.0)
Hemoglobin: 8.8 g/dL — ABNORMAL LOW (ref 13.0–17.0)
MCH: 28 pg (ref 26.0–34.0)
MCHC: 34.1 g/dL (ref 30.0–36.0)
MCV: 82.2 fL (ref 80.0–100.0)
NRBC: 0.9 % — AB (ref 0.0–0.2)
Platelets: 157 10*3/uL (ref 150–400)
RBC: 3.14 MIL/uL — ABNORMAL LOW (ref 4.22–5.81)
RDW: 19.9 % — ABNORMAL HIGH (ref 11.5–15.5)
WBC: 10.9 10*3/uL — ABNORMAL HIGH (ref 4.0–10.5)

## 2019-01-14 LAB — MAGNESIUM: Magnesium: 2.2 mg/dL (ref 1.7–2.4)

## 2019-01-14 LAB — PROTIME-INR
INR: 1.2
PROTHROMBIN TIME: 15.1 s (ref 11.4–15.2)

## 2019-01-14 MED ORDER — SODIUM CHLORIDE 0.9% FLUSH
10.0000 mL | Freq: Two times a day (BID) | INTRAVENOUS | Status: DC
  Administered 2019-01-17 – 2019-01-19 (×4): 10 mL
  Administered 2019-01-20: 20 mL
  Administered 2019-01-21 (×2): 10 mL
  Administered 2019-01-22: 20 mL
  Administered 2019-01-22 – 2019-01-27 (×7): 10 mL
  Administered 2019-01-28: 20 mL
  Administered 2019-01-29: 10 mL

## 2019-01-14 MED ORDER — SODIUM CHLORIDE 0.9% FLUSH: 10.0000 mL | INTRAVENOUS | Status: DC | PRN

## 2019-01-14 MED ORDER — CHLORHEXIDINE GLUCONATE CLOTH 2 % EX PADS
6.0000 | MEDICATED_PAD | Freq: Every day | CUTANEOUS | Status: DC
  Administered 2019-01-14 – 2019-01-29 (×17): 6 via TOPICAL

## 2019-01-14 NOTE — Progress Notes (Signed)
Spoke to RN at 7:40am. Patient still has unna boots on with RN will call us back if they are removed today. Vascular testing on hold until they are removed. Carlos Levering, RVT    Spoke to [place], Therapist, sports at 1:15 pm regarding patient. She believes they are still on but will call us back. June Leap, RVT, [place]   Vascular lab [phone]

## 2019-01-14 NOTE — Progress Notes (Signed)
LVAD Initial Psychosocial Screening  Date/Time Initiated:  01/13/2019  12:40pm Referral Source:  VAD Coordinator Referral Reason:  LVAD implantation Source of Information:  Patient, wife, [place]- son and chart review  Demographics Name:  Trimaine Maser Address:  [place] [address] Home phone:  [phone]   Cell: [phone] Marital Status:  Married  Faith:  [employer] Primary Language:  English SS:     DOB:  1941-07-29  Medical & Follow-up Adherence to Medical regimen/INR checks: compliant  Medication adherence: compliant  Physician/Clinic Appointment Attendance: compliant  Advance Directives: Do you have a Living Will or Medical POA? Yes  Would you like to complete a Living Will and Medical POA prior to surgery?  No Do you have Goals of Care? Yes  Have you had a consult with the Palliative Care Team at [facility]? Yes  Psychological Health Appearance:  In hospital gown Mental Status:  Alert, oriented Eye Contact:  Good Thought Content:  Coherent Speech:  Logical/coherent Mood:  Preoccupied  Affect:  Appropriate to circumstance Insight:  Good Judgement: Unimpaired Interaction Style:  Sarcastic  Family/Social Information Who lives in your home? Name:   Relationship:   Shelly Bombard  Wife [phone]  Other family members/support persons in your life? Name:   Relationship:   Corran Lalone Son [place] Son Lives in [place] Who is the primary caregiver? Matan- son Health status:  good Do you drive?  yes Do you work?  yes Physical Limitations:  none Do you have other care giving responsibilities?  none  Contact number:  [phone]  Who is the secondary caregiver? [place] status:  Limited physically Do you drive?  no Do you work?  no Physical Limitations:  Unable to lift arms and ambulates long distances with a wheelchair Do you have other care giving responsibilities?  no Contact number:  [phone]  Home Environment/Personal Care Do you have reliable phone service? Yes  If so, what is the number?  [phone] Do you own or rent your home? own Number of steps into the home? 3-4 steps How many levels in the home? 1 floor Assistive devices in the home? no Electrical needs for LVAD (3 prong outlets)? yes Second hand smoke exposure in the home? no Travel distance from [employer]? 12 miles   [employer] you active with community agencies/resources/homecare? Yes Agency Name: patient was unsure Are you active in a church, synagogue, mosque or other faith based community? Yes Faith based institutions name: Ann Maki baptist What other sources do you have for spiritual support? Visiting pastor and deacon Are you active in any clubs or social organizations?  Just church What do you do for fun?  Hobbies?  Interests? "My fun days are over" Basketball and Softball  Education/Work Information What is the last grade of school you completed?  12 th Preferred method of learning?  Written, Verbal and Hands on Do you have any problems with reading or writing?  No Are you currently employed?  No  When were you last employed? 2005  Name of employer? 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 35+ years If you are not working, do you plan to return to work after VAD surgery? No Are you interested in job training or learning new skills?  No Did you serve in the [employer]? No  If so, what branch? Other  Financial Information What is your source of income? SSA  Do you have difficulty meeting your monthly expenses? No Can you budget for the monthly cost for dressing supplies post procedure? Yes  Primary Health insurance:  [employer]: n/a Prescription plan: [facility]  What are your prescription co-pays? varies Do you use mail order for your prescriptions?  Yes Have you ever had to refuse  medication due to cost?  Yes Have you applied for Medicaid?  no  Have you applied for Social Security Disability (SSI)  n/a  Medical Information Briefly describe why you are here for evaluation: "1994 MD told me that I had a weak heart and it got worse" Do you have a PCP or other medical provider? [name]., MD Are you able to complete your ADL's? yes Do you have a history of trauma, physical, emotional, or sexual abuse? no Do you have any family history of heart problems? Father Do you smoke now or past usage? past usage    Quit date: July, 1977 Do you drink alcohol now or past usage? past usage    Quit date:  July, 1977 Are you currently using illegal drugs or misuse of medication or past usage? never  Have you ever been treated for substance abuse? No    Mental Health History How have you been feeling in the past year? "Down- in and out of the hospital" Have you ever had any problems with depression, anxiety or other mental health issues? "I ain't crazy" Do you see a counselor, psychiatrist or therapist?  no If you are currently experiencing problems are you interested in talking with a professional? No Have you or are you taking medications for anxiety/depression or any mental health concerns?  No  What are your coping strategies under stressful situations? pray Are there any other stressors in your life? My children Have you had any past or current thoughts of suicide? no How many hours do you sleep at night? 6-8 hours How is your appetite? varies Would you be interested in attending the LVAD support group? maybe  PHQ2 Depression Scale: 0  Legal Do you currently have any legal issues/problems?  no Have you had any legal issues/problems in the past?  no   Plan for VAD Implementation Do you know and understand what happens during the VAD surgery? Patient Verbalizes Understanding  of surgery and able to describe details What do you know about the risks and side effect  associated with VAD surgery? Patient Verbalizes Understanding  of risks (infection, stroke and death) Explain what will happen right after surgery: Patient Verbalizes Understanding  of OR to ICU and will be intubated What is your plan for transportation for the first 8 weeks post-surgery? (Patients are not recommended to drive post-surgery for 8 weeks)  Driver: Myriam Jacobson- son Do you have airbags in your vehicle? yes There is a risk of discharging the device if the airbag were to deploy. What do you know about your diet post-surgery? Patient Verbalizes Understanding  of Heart healthy How do you plan to monitor your medications, current and future?  Pill box  How do you plan to complete ADL's post-surgery?  Ask for help from my son/wife Will it be difficult to ask for help from your caregivers?  no  Please explain what you hope will be improved about your life as a result of receiving the LVAD? "hope it's better" Please tell me your biggest concern or fear about living with the LVAD?  No Please explain your understanding of how their body will change?  "  I don't know until it happens" Are you worried about these changes? Not sure Do you see any barriers to your surgery or follow-up? Not really  Understanding of LVAD Patient states understanding of the following: Surgical procedures and risks, Electrical need for LVAD (3 prong outlets), Safety precautions with LVAD (water, etc.), LVAD daily self-care (dressing changes, computer check, extra supplies), Outpatient follow up (LVAD clinic appts, monitoring blood thinners) and Need for Emergency Planning  Discussed and Reviewed with Patient and Caregiver  Patient's current level of motivation to prepare for LVAD: motivated Patient's present Level of Consent for LVAD: Ready     Education provided to patient/family/caregiver:   Caregiver role and responsibiltiy, Financial planning for LVAD, Role of Clinical Social Worker and Signs of Depression and  Anxiety  Comments:  Discussed caregiver role at length with patient's wife and son. Son reports he visits daily and will be primary caregiver stating "I will do whatever it takes to help". Son reports that his Mom struggles with her own health issues but that he feels she will be able to provide supervision, meal prep and light ADL assistance.   Caregiver questions Please explain what you hope will be improved about your life and loved one's life as a result of receiving the LVAD?  Improved Health What is your plan for availability to provide care 24/7 x2 weeks post op and dressing changes ongoing?  Son visits daily and will complete dressing changes and along with wife will provide 24/7 care for the first two weeks and thereafter. Preferred method of learning? Written, Verbal and Hands on  Do you drive? Son drives and will transport patient back and forth to the clinic for appointments. Do you think you can do this? yes Is there anything that concerns about caregiving?  no Do you provide caregiving to anyone else?  no  Caregiver's current level of motivation to prepare for LVAD: very motivated and supportive son Caregiver's present level of consent for LVAD: Ready  Clinical Interventions Needed:    CSW will monitor signs and symptoms of depression and assist with adjustment to life with an LVAD.  CSW will monitor and assist with financial counseling as needed for monthly budgeting.CSW encouraged attendance with the LVAD Support Group to assist further with adjustment and post implant peer support.  Clinical Impressions/Recommendations:   Mr. Haughn is a 78yo male who has been married for 33 years and has 3 sons. He reports one son Burrell is local and very supportive. He states he is compliant with his medical regimen and denies any concerns with follow up. He states he has an Scientist, physiological and names his wife as his HPOA. He owns his home and stated he has a ramp out the back of the  house for his wife. He is involved with his church community and his pastor and deacon visit often. 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 He receives [employer] retirement and mentioned that sometimes has difficulty with meeting his monthly expenses as he is not able to due odd jobs for extra money due to his health. He states that he quit using tobacco and alcohol in 1977 and never used any illegal substances. He reports that he has no mental health concerns and scored a 0 on the PHQ-9. He states that he prays as a coping mechanisms during stress times. He is hopeful for improved health as a result of getting the LVAD. He denies any barriers to surgery  or follow up. CSW would recommend patient for LVAD implantation. Raquel Sarna, LCSW, [place]   Louann Liv, LCSW

## 2019-01-14 NOTE — Progress Notes (Signed)
Spoke with Clarene Critchley, RN. Patient will have unna boots for a few more days. Cannot perform vascular tests on legs until they are removed.  ABI and LE venous orders (Pre VAD) need to be reordered when patient can have testing.   June Leap, [place], [place], RVT [phone] Vascular lab

## 2019-01-14 NOTE — Progress Notes (Signed)
Patient ID: Spencer Rice, male   DOB: Mar 30, 1941, 78 y.o.   MRN: [id number]     Advanced Heart Failure Rounding Note  PCP-Cardiologist: No primary care provider on file.   Subjective:    Remains on milrinone 0.25 mcg/kg/min.  Developed large spontaneous RP bleed on heparin 2/10.   Heparin stopped. Given 2uRBCs overnight. Repeat Hgb 9.2. Co-ox 66% CVP 6-7  [facility] 01/07/19  Ost RCA lesion is 50% stenosed.  Mid RCA lesion is 40% stenosed.  Prox LAD lesion is 50% stenosed.  Ost 2nd Diag to 2nd Diag lesion is 100% stenosed.   Findings:  Done on milrinone 0.25 mcg/kg/min  RA = 7  RV = 62/8 PA = 65/26 (41) PCW = 19 Fick cardiac output/index = 5.6/2.7 PVR = 3.9 WU FA sat = 95% PA sat = 65%, 66% PaPI = 5.6  ABG on 2L:  7.50/40/67/95%  Assessment: 1. Mild non-obstructive CAD 2. Moderate PAH with normal PAPI 3. Normal CO on milrinone  ECHO 01/01/2019  EF 10% with severe MR, severe TR, Biatrial enlargement, RV moderately reduced function.   Objective:   Weight Range: 82.7 kg Body mass index is 23.73 kg/m.   Vital Signs:   Temp:  [98 F (36.7 C)-99.2 F (37.3 C)] 98.8 F (37.1 C) (02/11 0537) Pulse Rate:  [102-110] 105 (02/11 0600) Resp:  [16-29] 24 (02/11 0600) BP: (93-122)/(53-87) 112/63 (02/11 0600) SpO2:  [91 %-100 %] 99 % (02/11 0600) Weight:  [82.7 kg] 82.7 kg (02/11 0643) Last BM Date: 01/12/19  Weight change: Filed Weights   01/12/19 0600 01/13/19 0503 01/14/19 0643  Weight: 82.6 kg 81.1 kg 82.7 kg    Intake/Output:   Intake/Output Summary (Last 24 hours) at 01/14/2019 0654 Last data filed at 01/14/2019 0537 Gross per 24 hour  Intake 2238.43 ml  Output 1200 ml  Net 1038.43 ml      Physical Exam   General: Sitting in chair . No resp difficulty HEENT: normal Neck: supple.JVP 7 Carotids 2+ bilat; no bruits. No lymphadenopathy or thryomegaly appreciated. Cor: PMI laterally displaced. Regular rate & rhythm.2/6 MR Lungs: clear Abdomen:  soft, nontender, + distended.  Hematoma on left flank No hepatosplenomegaly. No bruits or masses. Good bowel sounds. Extremities: no cyanosis, clubbing, rash, edema  RUE PICC Neuro: alert & orientedx3, cranial nerves grossly intact. moves all 4 extremities w/o difficulty. Affect pleasant   Telemetry   Sinus tach with BiV pacing 100-110s. Personally reviewed  EKG    No new tracings.   Labs    CBC Recent Labs    01/13/19 1004 01/13/19 1700  WBC 9.0 11.1*  HGB 9.5* 7.2*  HCT 27.4* 21.9*  MCV 80.4 81.7  PLT 119* 229   Basic Metabolic Panel Recent Labs    01/12/19 0530 01/13/19 0500  NA 133* 133*  K 3.6 3.9  CL 91* 91*  CO2 29 30  GLUCOSE 129* 158*  BUN 35* 44*  CREATININE 1.50* 1.71*  CALCIUM 8.7* 8.6*  MG 2.1 2.2   Liver Function Tests No results for input(s): AST, ALT, ALKPHOS, BILITOT, PROT, ALBUMIN in the last 72 hours. No results for input(s): LIPASE, AMYLASE in the last 72 hours. Cardiac Enzymes No results for input(s): CKTOTAL, CKMB, CKMBINDEX, TROPONINI in the last 72 hours.  BNP: BNP (last 3 results) Recent Labs    12/04/18 1743 12/28/18 1821 12/29/18 1943  BNP >4,500.0* >4,925.0* >4,500.0*    ProBNP (last 3 results) No results for input(s): PROBNP in the last 8760 hours.  D-Dimer No results for input(s): DDIMER in the last 72 hours. Hemoglobin A1C No results for input(s): HGBA1C in the last 72 hours. Fasting Lipid Panel No results for input(s): CHOL, HDL, LDLCALC, TRIG, CHOLHDL, LDLDIRECT in the last 72 hours. Thyroid Function Tests No results for input(s): TSH, T4TOTAL, T3FREE, THYROIDAB in the last 72 hours.  Invalid input(s): FREET3  Other results:   Imaging    Ct Abdomen Pelvis Wo Contrast  Result Date: 01/13/2019 CLINICAL DATA:  LEFT side abdominal pain EXAM: CT ABDOMEN AND PELVIS WITHOUT CONTRAST TECHNIQUE: Multidetector CT imaging of the abdomen and pelvis was performed following the standard protocol without IV contrast.  COMPARISON:  CT 01/06/2019 FINDINGS: Lower chest: Mild ground-glass opacity in the RIGHT middle lobe suggest pulmonary edema. Small focus consolidation in the LEFT lower lobe represents atelectasis or infiltrate. Hepatobiliary: No focal hepatic lesion on noncontrast exam. Gallbladder normal. Pancreas: Pancreas is normal. No ductal dilatation. No pancreatic inflammation. Spleen: Normal spleen Adrenals/urinary tract: Adrenal glands normal. Ablation site in the LEFT mid kidney noted. Simple cyst in the LEFT kidney again noted. No hydronephrosis or hydroureter. Stomach/Bowel: Stomach, small bowel, appendix, and cecum are normal. The colon and rectosigmoid colon are normal. Vascular/Lymphatic: Abdominal aorta is normal. There is intimal calcification. No lymphadenopathy Reproductive: Prostate normal Other: New large fluid collection with fluid fluid level within the LEFT retroperitoneal space extending along the psoas muscle and into the inguinal canal. Collection measures 9.4 x 8.6 by 16.0 cm (volume = 680 cm^3). Findings most consistent within acute/subacute retroperitoneal hematoma. Hematoma elevates the LEFT kidney. Dystrophic calcification again noted the LEFT psoas muscle. Musculoskeletal: No aggressive osseous lesion. No evidence of fracture. IMPRESSION: 1. Interval development of a large LEFT retroperitoneal hematoma new from CT 01/06/2019 with an estimated volume of 680 cubic cm. Hematoma extends deep to the LEFT pararenal space from the level of the superior pole of the LEFT kidney to the LEFT iliac fossa and into a LEFT inguinal hernia. Recommend physical examination of the LEFT hemiscrotum. 2. Mild ground-glass opacities in the RIGHT middle lobe suggest mild pulmonary edema. Small focus of atelectasis or infiltrate in the LEFT lower lobe. Findings conveyed toASHLEY SMITH on 01/13/2019  at12:26. Electronically Signed   By: Suzy Bouchard M.D.   On: 01/13/2019 12:29     Medications:     Scheduled  Medications: . ALPRAZolam  0.25 mg Oral Once  . amiodarone  200 mg Oral Daily  . feeding supplement  1 Container Oral BID BM  . lactobacillus  1 g Oral TID WC  . mouth rinse  15 mL Mouth Rinse BID  . mometasone-formoterol  2 puff Inhalation BID  . multivitamin with minerals  1 tablet Oral Daily  . pantoprazole  40 mg Oral Daily  . sodium chloride flush  3 mL Intravenous Once  . sodium chloride flush  3 mL Intravenous Q12H  . sodium chloride flush  3 mL Intravenous Q12H  . spironolactone  12.5 mg Oral QHS  . tamsulosin  0.8 mg Oral QPC supper    Infusions: . sodium chloride 10 mL/hr at 01/14/19 0500  . 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    Admitted with marked volume overload and cardiogenic shock .   Assessment/Plan   1. A/C Systolic Heart Failure -> cardiogenic shock: Due to primarily to nonischemic cardiomyopathy (out of proportion to CAD). [place] 2015 with moderate disease. Has Biotronik BiV ICD.  Initial CO-OX 38% so milrinone started on 1/28. ECHO 01/01/19 EF 10% with severe MR/TR, biatrial enlargement, RV with moderately reduced function.  - Continue mirlinone 0.25 mcg/kg/min. Co-ox 66% CVP 6. Continue to hold diuretics today. Possible restart tomorrow - Discussed at [place] on 2/10 and would be possible candidate once RP bleed improves. Needs PFTs.   2. RP bleed with symptomatic anemia - spontaneous while on heparin (? Traumatic component). Was on heparin for h/o LV clot - Rec'd 2u RBCs  overnight - Repeat hgb 9.2 this am - Heparin off - Place SCDs  3. CKD Stage III, creatinine baseline 1.8-2.4.   - AM labs pending. Holding torsemide for now. Likely restart diuretics soon  4. COPD on chronic home oxygen: He will need PFTs to assess degree of lung damage (ordered). No change.   5. L Kidney Cancer: Had cryoablation in 2015. No change.   6. PVCs: Started on amiodarone 2019 to suppress PVCs.  - Continue amiodarone 200 mg daily. No change.   7. 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 No change.   8. UTI, Klebsiella pneumoniae  - Completed rocephin course. No change.   9. Pulmonary nodule: 6 mm nodule in LUL noted on CT scan. Recommended 6 month repeat non contrast CT. Dr Haroldine Laws discussed with Dr Prescott Gum and okay to move forward with VAD workup. No change.    CRITICAL CARE Performed by: Glori Bickers  Total critical care time: 35 minutes  Critical care time was exclusive of separately billable procedures and treating other patients.  Critical care was necessary to treat or prevent imminent or life-threatening deterioration.  Critical care was time spent personally by me (independent of midlevel providers or residents) on the following activities: development of treatment plan with patient and/or surrogate as well as nursing, discussions with consultants, evaluation of patient's response to treatment, examination of patient, obtaining history from patient or surrogate, ordering and performing treatments and interventions, ordering and review of laboratory studies, ordering and review of radiographic studies, pulse oximetry and re-evaluation of patient's condition.   Length of Stay: [place], MD  6:54 AM  Advanced Heart Failure Team Pager [phone] (M-F; 7a - 4p)  Please contact [facility] for night-coverage after hours (4p -7a ) and weekends on [email or web address]

## 2019-01-14 NOTE — Progress Notes (Signed)
Discussed with NP, will hold ambulation today. Pt is up in room. Will f/u tomorrow Yves Dill CES, ACSM 1:29 PM 01/14/2019

## 2019-01-15 ENCOUNTER — Inpatient Hospital Stay (HOSPITAL_COMMUNITY): Payer: Medicare Other

## 2019-01-15 LAB — BPAM RBC
Blood Product Expiration Date: 202003092359
Blood Product Expiration Date: 202003092359
ISSUE DATE / TIME: 202002102104
ISSUE DATE / TIME: 202002110115
UNIT TYPE AND RH: 5100
Unit Type and Rh: 5100

## 2019-01-15 LAB — PULMONARY FUNCTION TEST
DL/VA % pred: 121 %
DL/VA: 4.7 ml/min/mmHg/L
DLCO cor % pred: 69 %
DLCO cor: 18.97 ml/min/mmHg
DLCO unc % pred: 53 %
DLCO unc: 14.64 ml/min/mmHg
FEF 25-75 Pre: 2.81 L/sec
FEF2575-%Pred-Pre: 113 %
FEV1-%Pred-Pre: 64 %
FEV1-Pre: 2 L
FEV1FVC-%Pred-Pre: 116 %
FEV6-%Pred-Pre: 57 %
FEV6-Pre: 2.28 L
FEV6FVC-%Pred-Pre: 105 %
FVC-%Pred-Pre: 54 %
FVC-Pre: 2.28 L
Pre FEV1/FVC ratio: 88 %
Pre FEV6/FVC Ratio: 100 %

## 2019-01-15 LAB — CBC
HCT: 24.5 % — ABNORMAL LOW (ref 39.0–52.0)
HCT: 26.8 % — ABNORMAL LOW (ref 39.0–52.0)
Hemoglobin: 8.3 g/dL — ABNORMAL LOW (ref 13.0–17.0)
Hemoglobin: 8.6 g/dL — ABNORMAL LOW (ref 13.0–17.0)
MCH: 28.3 pg (ref 26.0–34.0)
MCH: 27 pg (ref 26.0–34.0)
MCHC: 33.9 g/dL (ref 30.0–36.0)
MCHC: 32.1 g/dL (ref 30.0–36.0)
MCV: 83.6 fL (ref 80.0–100.0)
MCV: 84.3 fL (ref 80.0–100.0)
Platelets: 142 10*3/uL — ABNORMAL LOW (ref 150–400)
Platelets: 144 10*3/uL — ABNORMAL LOW (ref 150–400)
RBC: 2.93 MIL/uL — ABNORMAL LOW (ref 4.22–5.81)
RBC: 3.18 MIL/uL — ABNORMAL LOW (ref 4.22–5.81)
RDW: 19.9 % — ABNORMAL HIGH (ref 11.5–15.5)
RDW: 19.8 % — ABNORMAL HIGH (ref 11.5–15.5)
WBC: 10.7 10*3/uL — ABNORMAL HIGH (ref 4.0–10.5)
WBC: 10.9 10*3/uL — ABNORMAL HIGH (ref 4.0–10.5)
nRBC: 1.3 % — ABNORMAL HIGH (ref 0.0–0.2)
nRBC: 1.6 % — ABNORMAL HIGH (ref 0.0–0.2)

## 2019-01-15 LAB — BASIC METABOLIC PANEL
Anion gap: 12 (ref 5–15)
BUN: 42 mg/dL — ABNORMAL HIGH (ref 8–23)
CO2: 28 mmol/L (ref 22–32)
Calcium: 8.6 mg/dL — ABNORMAL LOW (ref 8.9–10.3)
Chloride: 92 mmol/L — ABNORMAL LOW (ref 98–111)
Creatinine, Ser: 1.53 mg/dL — ABNORMAL HIGH (ref 0.61–1.24)
GFR calc Af Amer: 50 mL/min — ABNORMAL LOW (ref >60)
GFR calc non Af Amer: 43 mL/min — ABNORMAL LOW (ref >60)
GLUCOSE: 147 mg/dL — AB (ref 70–99)
Potassium: 4.1 mmol/L (ref 3.5–5.1)
Sodium: 132 mmol/L — ABNORMAL LOW (ref 135–145)

## 2019-01-15 LAB — TYPE AND SCREEN
ABO/RH(D): O POS
Antibody Screen: NEGATIVE
Unit division: 0
Unit division: 0

## 2019-01-15 LAB — COOXEMETRY PANEL
Carboxyhemoglobin: 2.3 % — ABNORMAL HIGH (ref 0.5–1.5)
METHEMOGLOBIN: 1.7 % — AB (ref 0.0–1.5)
O2 Saturation: 60.5 %
Total hemoglobin: 8.5 g/dL — ABNORMAL LOW (ref 12.0–16.0)

## 2019-01-15 LAB — PROTIME-INR
INR: 1.2
PROTHROMBIN TIME: 15.1 s (ref 11.4–15.2)

## 2019-01-15 LAB — MAGNESIUM: Magnesium: 2.2 mg/dL (ref 1.7–2.4)

## 2019-01-15 LAB — HEPARIN LEVEL (UNFRACTIONATED): Heparin Unfractionated: <0.1 IU/mL — ABNORMAL LOW (ref 0.30–0.70)

## 2019-01-15 NOTE — Progress Notes (Addendum)
Physical Therapy Treatment Patient Details Name: Spencer Rice MRN: [id number] DOB: 1941/09/21 Today's Date: 01/15/2019    History of Present Illness 78yo male with ongoing SOB, chest x-ray showing pulmonary edema and R pleural effusion. Admitted for CHF exacerbation. Pt developed retroperitoneal bleed. 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 Complicated by retroperitoneal bleed.    Follow Up Recommendations  Home health PT     Equipment Recommendations  Rolling walker with 5" wheels    Recommendations for Other Services       Precautions / Restrictions Precautions Precautions: Fall Restrictions Weight Bearing Restrictions: No    Mobility  Bed Mobility               General bed mobility comments: Up in chair  Transfers Overall transfer level: Needs assistance Equipment used: Rolling walker (2 wheeled) Transfers: Sit to/from Stand Sit to Stand: Min guard         General transfer comment: Assist for safety and lines  Ambulation/Gait Ambulation/Gait assistance: Supervision;Min guard Gait Distance (Feet): 140 Feet Assistive device: Rolling walker (2 wheeled) Gait Pattern/deviations: Step-through pattern;Decreased stride length;Decreased step length - right;Decreased step length - left Gait velocity: decreased  Gait velocity interpretation: 1.31 - 2.62 ft/sec, indicative of limited community ambulator General Gait Details: Assist for safety and lines. Pt amb on 4L of O2 with SpO2 >94%. Pt required 2 standing rest breaks. As pt fatigued required min guard and demonstrated shortened step length. Pt reported some light headedness with amb. BP seated after amb 114/74   Stairs             Wheelchair Mobility    Modified Rankin (Stroke Patients Only)       Balance Overall balance assessment: Mild deficits observed, not formally tested                                           Cognition Arousal/Alertness: Awake/alert Behavior During Therapy: Flat affect Overall Cognitive Status: Within Functional Limits for tasks assessed                                        Exercises      General Comments        Pertinent Vitals/Pain      Home Living                      Prior Function            PT Goals (current goals can now be found in the care plan section) Acute Rehab PT Goals Patient Stated Goal: go home Progress towards PT goals: Not progressing toward goals - comment(pt with new retroperitoneal bleed)    Frequency    Min 3X/week      PT Plan Current plan remains appropriate(will update if pt has LVAD)    Co-evaluation              AM-PAC PT "6 Clicks" Mobility   Outcome Measure  Help needed turning from your back to your side while in a flat bed without using bedrails?: A Little Help needed moving from lying on your back to sitting on the side of  a flat bed without using bedrails?: A Little Help needed moving to and from a bed to a chair (including a wheelchair)?: A Little Help needed standing up from a chair using your arms (e.g., wheelchair or bedside chair)?: A Little Help needed to walk in hospital room?: A Little Help needed climbing 3-5 steps with a railing? : A Little 6 Click Score: 18    End of Session Equipment Utilized During Treatment: Oxygen Activity Tolerance: Patient limited by fatigue Patient left: in chair;with call bell/phone within reach Nurse Communication: Mobility status PT Visit Diagnosis: Muscle weakness (generalized) (M62.81);Other abnormalities of gait and mobility (R26.89)     Time: 2244-9753 PT Time Calculation (min) (ACUTE ONLY): 20 min  Charges:  $Gait Training: 8-22 mins                     [place] Pager [phone] Office [place] 01/15/2019, 10:04  AM

## 2019-01-15 NOTE — Progress Notes (Addendum)
Patient ID: Spencer Rice, male   DOB: [birth year], 78 y.o.   MRN: [id number]     Advanced Heart Failure Rounding Note  PCP-Cardiologist: No primary care provider on file.   Subjective:    Remains on milrinone 0.25 mcg/kg/min.Co-ox stable at 61%  Developed large spontaneous RP bleed on heparin 2/10.   2/11 received 2UPRBCs. Hgb went up 7.2>8.8 but trending back down to 8.3 this morning.   Remains on milrinone 0.25 mcg. CO-OX is 61%.   Denies SOB.  Less abdominal pain today 3/10.    [facility] 01/07/19  Ost RCA lesion is 50% stenosed.  Mid RCA lesion is 40% stenosed.  Prox LAD lesion is 50% stenosed.  Ost 2nd Diag to 2nd Diag lesion is 100% stenosed. Findings: Done on milrinone 0.25 mcg/kg/min RA = 7  RV = 62/8 PA = 65/26 (41) PCW = 19 Fick cardiac output/index = 5.6/2.7 PVR = 3.9 WU FA sat = 95% PA sat = 65%, 66% PaPI = 5.6 Assessment: 1. Mild non-obstructive CAD 2. Moderate PAH with normal PAPI 3. Normal CO on milrinone  ECHO 01/01/2019  EF 10% with severe Spencer, severe TR, Biatrial enlargement, RV moderately reduced function.   Objective:   Weight Range: 82.3 kg Body mass index is 23.61 kg/m.   Vital Signs:   Temp:  [98.6 F (37 C)-99.4 F (37.4 C)] 99.3 F (37.4 C) (02/12 0400) Pulse Rate:  [93-104] 99 (02/12 0800) Resp:  [11-25] 17 (02/12 0800) BP: (89-116)/(54-73) 97/54 (02/12 0800) SpO2:  [95 %-100 %] 98 % (02/12 0800) Weight:  [82.3 kg] 82.3 kg (02/12 0500) Last BM Date: (P) 01/13/19  Weight change: Filed Weights   01/13/19 0503 01/14/19 0643 01/15/19 0500  Weight: 81.1 kg 82.7 kg 82.3 kg    Intake/Output:   Intake/Output Summary (Last 24 hours) at 01/15/2019 0832 Last data filed at 01/15/2019 0804 Gross per 24 hour  Intake 526.02 ml  Output 1450 ml  Net -923.98 ml      Physical Exam  CVP 9  General:  Sitting in the chair.  No resp difficulty HEENT: normal Neck: supple. JVP 7-8 . Carotids 2+ bilat; no bruits. No lymphadenopathy or  thryomegaly appreciated. Cor: PMI nondisplaced. Regular rate & rhythm. No rubs, gallops or murmurs. Lungs: clear Abdomen: soft,  RLQ tender, distended. No hepatosplenomegaly. No bruits or masses. Good bowel sounds. Extremities: no cyanosis, clubbing, rash, R and LLE unna boots.  Neuro: alert & orientedx3, cranial nerves grossly intact. moves all 4 extremities w/o difficulty. Affect pleasant   Telemetry   Sinus Tach 100s   EKG    No new tracings.   Labs    CBC Recent Labs    01/14/19 0625 01/15/19 0416  WBC 10.9* 10.7*  HGB 8.8* 8.3*  HCT 25.8* 24.5*  MCV 82.2 83.6  PLT 157 956*   Basic Metabolic Panel Recent Labs    01/14/19 0625 01/15/19 0416  NA 134* 132*  K 4.4 4.1  CL 90* 92*  CO2 30 28  GLUCOSE 161* 147*  BUN 47* 42*  CREATININE 1.72* 1.53*  CALCIUM 8.8* 8.6*  MG 2.2 2.2   Liver Function Tests Recent Labs    01/14/19 0625  AST 54*  ALT 22  ALKPHOS 74  BILITOT 5.3*  PROT 6.3*  ALBUMIN 2.5*   No results for input(s): LIPASE, AMYLASE in the last 72 hours. Cardiac Enzymes No results for input(s): CKTOTAL, CKMB, CKMBINDEX, TROPONINI in the last 72 hours.  BNP: BNP (last 3 results) Recent Labs  12/04/18 1743 12/28/18 1821 12/29/18 1943  BNP >4,500.0* >4,925.0* >4,500.0*    ProBNP (last 3 results) No results for input(s): PROBNP in the last 8760 hours.   D-Dimer No results for input(s): DDIMER in the last 72 hours. Hemoglobin A1C No results for input(s): HGBA1C in the last 72 hours. Fasting Lipid Panel No results for input(s): CHOL, HDL, LDLCALC, TRIG, CHOLHDL, LDLDIRECT in the last 72 hours. Thyroid Function Tests No results for input(s): TSH, T4TOTAL, T3FREE, THYROIDAB in the last 72 hours.  Invalid input(s): FREET3  Other results:   Imaging    No results found.   Medications:     Scheduled Medications: . ALPRAZolam  0.25 mg Oral Once  . amiodarone  200 mg Oral Daily  . Chlorhexidine Gluconate Cloth  6 each Topical  Daily  . feeding supplement  1 Container Oral BID BM  . lactobacillus  1 g Oral TID WC  . mouth rinse  15 mL Mouth Rinse BID  . mometasone-formoterol  2 puff Inhalation BID  . multivitamin with minerals  1 tablet Oral Daily  . pantoprazole  40 mg Oral Daily  . sodium chloride flush  10-40 mL Intracatheter Q12H  . sodium chloride flush  3 mL Intravenous Once  . sodium chloride flush  3 mL Intravenous Q12H  . sodium chloride flush  3 mL Intravenous Q12H  . spironolactone  12.5 mg Oral QHS  . tamsulosin  0.8 mg Oral QPC supper    Infusions: . sodium chloride 10 mL/hr at 01/15/19 0800  . 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    Admitted with marked volume overload and cardiogenic shock .   Assessment/Plan   1. A/C Systolic Heart Failure -> cardiogenic shock: Due to primarily to nonischemic cardiomyopathy (out of proportion to CAD). [place] 2015 with moderate disease. Has Biotronik BiV ICD.  Initial CO-OX 38% so milrinone started on 1/28. ECHO 01/01/19 EF 10% with severe Spencer/TR, biatrial enlargement, RV with moderately reduced function.  - Continue mirlinone 0.25 mcg/kg/min. CO-OX 60.5% - Discussed at VAD MRB meeting on 2/10 and would be possible candidate once RP bleed improves. PFTs today.  Anticipate starting torsemide 20 mg daily tomorrow.    2. RP bleed with symptomatic anemia - spontaneous while on heparin (? Traumatic component). Was on heparin for h/o LV clot - Rec'd 2u RBCs overnight - Hgb trending down 8.8>8.3.  check CBC at 1300. If hgb < 8 will need another unit of blood.   - Place SCDs  3. CKD Stage III, creatinine baseline 1.8-2.4.   Creatinine stable 1.5.  BMEt in am.   4. COPD on chronic home oxygen: He will need PFTs to assess degree of lung damage (ordered). No change.   5. L Kidney Cancer: Had cryoablation in 2015. No change.   6. PVCs: Started on amiodarone 2019 to suppress PVCs.  - Continue amiodarone 200 mg daily. No change.   7. Mitral Regurgitation/Tricuspid Regurgitation: Severe Spencer/TR on ECHO 01/01/19. No change.   8. UTI, Klebsiella pneumoniae  - Completed rocephin course. No change.   9. Pulmonary nodule:  6 mm nodule in LUL noted on CT scan. Recommended 6 month repeat non contrast CT. Dr Haroldine Laws discussed with Dr Prescott Gum and okay to move forward with VAD workup. No change.     Length of Stay: [place], NP  8:32 AM  Advanced Heart Failure Team Pager [phone] (M-F; 7a - 4p)  Please contact [facility] for night-coverage after hours (4p -7a ) and weekends on [email or web address]   Patient seen and examined with the above-signed Advanced Practice Provider and/or Housestaff. I personally reviewed laboratory data, imaging studies and relevant notes. I independently examined the patient and formulated the important aspects of the plan. I have edited the note to reflect any of my changes or salient points. I have personally discussed the plan with the patient and/or family.  Patient is s/p large retroperitoneal bleed. He is more comfortable today.. Hgb down slightly but still > 8g/dl. Ab pain improving. Remains on milrinone. Co-ox stable at 61%. Volume status ok. He is getting PFTs today as part of VAD w/u. Remains candidate for VAD but given RP bleed will have to re-discuss timing of implant with VAD team at [facility]. Will transfuse further as needed.   Glori Bickers, MD  11:35 PM

## 2019-01-15 NOTE — Plan of Care (Signed)
Pt progressing with understanding of his disease

## 2019-01-15 NOTE — Progress Notes (Signed)
CSW met with patient at bedside to follow up on yesterday's visit with VAD patient. He stated he enjoyed the visit with Laverna Peace and continues to be agreeable to pursue the VAD should he be medically appropriate. Patient stated he is feeling better today and hopeful for continued better days ahead. CSW provided supportive intervention and will continue to follow as needed. Raquel Sarna, [place], [place]

## 2019-01-16 LAB — CBC
HCT: 24.7 % — ABNORMAL LOW (ref 39.0–52.0)
Hemoglobin: 8.1 g/dL — ABNORMAL LOW (ref 13.0–17.0)
MCH: 27.8 pg (ref 26.0–34.0)
MCHC: 32.8 g/dL (ref 30.0–36.0)
MCV: 84.9 fL (ref 80.0–100.0)
Platelets: 122 10*3/uL — ABNORMAL LOW (ref 150–400)
RBC: 2.91 MIL/uL — ABNORMAL LOW (ref 4.22–5.81)
RDW: 20.3 % — ABNORMAL HIGH (ref 11.5–15.5)
WBC: 10.5 10*3/uL (ref 4.0–10.5)
nRBC: 1.3 % — ABNORMAL HIGH (ref 0.0–0.2)

## 2019-01-16 LAB — BASIC METABOLIC PANEL
Anion gap: 11 (ref 5–15)
BUN: 33 mg/dL — ABNORMAL HIGH (ref 8–23)
CO2: 27 mmol/L (ref 22–32)
Calcium: 8.4 mg/dL — ABNORMAL LOW (ref 8.9–10.3)
Chloride: 95 mmol/L — ABNORMAL LOW (ref 98–111)
Creatinine, Ser: 1.37 mg/dL — ABNORMAL HIGH (ref 0.61–1.24)
GFR calc non Af Amer: 49 mL/min — ABNORMAL LOW (ref >60)
GFR, EST AFRICAN AMERICAN: 57 mL/min — AB (ref >60)
Glucose, Bld: 164 mg/dL — ABNORMAL HIGH (ref 70–99)
Potassium: 4.2 mmol/L (ref 3.5–5.1)
SODIUM: 133 mmol/L — AB (ref 135–145)

## 2019-01-16 LAB — PROTIME-INR
INR: 1.24
Prothrombin Time: 15.5 seconds — ABNORMAL HIGH (ref 11.4–15.2)

## 2019-01-16 LAB — POCT I-STAT 7, (LYTES, BLD GAS, ICA,H+H)
Acid-Base Excess: 4 mmol/L — ABNORMAL HIGH (ref 0.0–2.0)
Bicarbonate: 27.2 mmol/L (ref 20.0–28.0)
Calcium, Ion: 1.11 mmol/L — ABNORMAL LOW (ref 1.15–1.40)
HEMATOCRIT: 30 % — AB (ref 39.0–52.0)
Hemoglobin: 10.2 g/dL — ABNORMAL LOW (ref 13.0–17.0)
O2 Saturation: 98 %
PO2 ART: 89 mmHg (ref 83.0–108.0)
Potassium: 3.8 mmol/L (ref 3.5–5.1)
Sodium: 132 mmol/L — ABNORMAL LOW (ref 135–145)
TCO2: 28 mmol/L (ref 22–32)
pCO2 arterial: 35.6 mmHg (ref 32.0–48.0)
pH, Arterial: 7.492 — ABNORMAL HIGH (ref 7.350–7.450)

## 2019-01-16 LAB — URINE CULTURE: Culture: 50000 — AB

## 2019-01-16 LAB — COOXEMETRY PANEL
Carboxyhemoglobin: 2.9 % — ABNORMAL HIGH (ref 0.5–1.5)
Methemoglobin: 1 % (ref 0.0–1.5)
O2 Saturation: 70.7 %
Total hemoglobin: 8.4 g/dL — ABNORMAL LOW (ref 12.0–16.0)

## 2019-01-16 LAB — MAGNESIUM: Magnesium: 2.2 mg/dL (ref 1.7–2.4)

## 2019-01-16 LAB — PREALBUMIN: Prealbumin: 7.6 mg/dL — ABNORMAL LOW (ref 18–38)

## 2019-01-16 MED ORDER — TORSEMIDE 20 MG PO TABS
20.0000 mg | ORAL_TABLET | Freq: Every day | ORAL | Status: DC
  Administered 2019-01-16: 20 mg via ORAL
  Filled 2019-01-16 (×2): qty 1

## 2019-01-16 MED ORDER — SORBITOL 70 % SOLN
30.0000 mL | Freq: Once | Status: AC
  Administered 2019-01-16: 30 mL via ORAL
  Filled 2019-01-16: qty 30

## 2019-01-16 MED ORDER — ENOXAPARIN SODIUM 40 MG/0.4ML ~~LOC~~ SOLN: 40.0000 mg | SUBCUTANEOUS | Status: DC

## 2019-01-16 NOTE — Progress Notes (Addendum)
Patient ID: Spencer Rice, male   DOB: [birth year], 78 y.o.   MRN: [id number]     Advanced Heart Failure Rounding Note  PCP-Cardiologist: No primary care provider on file.   Subjective:    Remains on milrinone 0.25 mcg/kg/min.Co-ox stable at 61%  Developed large spontaneous RP bleed on heparin 2/10.   2/11 received 2UPRBCs. Hgb today 8.1.   Remains on milrinone 0.25 mcg. CO-OX is 71%.   Denies SOB. Abdominal pain 3/10.    [facility] 01/07/19  Ost RCA lesion is 50% stenosed.  Mid RCA lesion is 40% stenosed.  Prox LAD lesion is 50% stenosed.  Ost 2nd Diag to 2nd Diag lesion is 100% stenosed. Findings: Done on milrinone 0.25 mcg/kg/min RA = 7  RV = 62/8 PA = 65/26 (41) PCW = 19 Fick cardiac output/index = 5.6/2.7 PVR = 3.9 WU FA sat = 95% PA sat = 65%, 66% PaPI = 5.6 Assessment: 1. Mild non-obstructive CAD 2. Moderate PAH with normal PAPI 3. Normal CO on milrinone  ECHO 01/01/2019  EF 10% with severe MR, severe TR, Biatrial enlargement, RV moderately reduced function.   Objective:   Weight Range: 79.1 kg Body mass index is 22.7 kg/m.   Vital Signs:   Temp:  [98.5 F (36.9 C)-100 F (37.8 C)] 98.5 F (36.9 C) (02/13 0700) Pulse Rate:  [93-100] 97 (02/13 0800) Resp:  [16-30] 17 (02/13 0800) BP: (90-114)/(54-87) 103/65 (02/13 0800) SpO2:  [95 %-100 %] 98 % (02/13 0800) Weight:  [79.1 kg] 79.1 kg (02/13 0500) Last BM Date: 01/13/19  Weight change: Filed Weights   01/14/19 0643 01/15/19 0500 01/16/19 0500  Weight: 82.7 kg 82.3 kg 79.1 kg    Intake/Output:   Intake/Output Summary (Last 24 hours) at 01/16/2019 0935 Last data filed at 01/16/2019 0800 Gross per 24 hour  Intake 766.11 ml  Output 825 ml  Net -58.89 ml      Physical Exam  CVP 10  General:  No resp difficulty. In the chair.  HEENT: normal Neck: supple. JVP 9-10 . Carotids 2+ bilat; no bruits. No lymphadenopathy or thryomegaly appreciated. Cor: PMI nondisplaced. Regular rate & rhythm. No  rubs, gallops. 2/6 MR  Lungs: crackles in the bases. Abdomen: soft, nontender, distended and tender. No hepatosplenomegaly. No bruits or masses. Good bowel sounds. Extremities: no cyanosis, clubbing, rash, R and LLE unna boots.  RUE PICC Neuro: alert & orientedx3, cranial nerves grossly intact. moves all 4 extremities w/o difficulty. Affect pleasant   Telemetry   SR-ST 90-100s with PVCs   EKG    No new tracings.   Labs    CBC Recent Labs    01/15/19 1346 01/16/19 0543  WBC 10.9* 10.5  HGB 8.6* 8.1*  HCT 26.8* 24.7*  MCV 84.3 84.9  PLT 144* 130*   Basic Metabolic Panel Recent Labs    01/15/19 0416 01/16/19 0543  NA 132* 133*  K 4.1 4.2  CL 92* 95*  CO2 28 27  GLUCOSE 147* 164*  BUN 42* 33*  CREATININE 1.53* 1.37*  CALCIUM 8.6* 8.4*  MG 2.2 2.2   Liver Function Tests Recent Labs    01/14/19 0625  AST 54*  ALT 22  ALKPHOS 74  BILITOT 5.3*  PROT 6.3*  ALBUMIN 2.5*   No results for input(s): LIPASE, AMYLASE in the last 72 hours. Cardiac Enzymes No results for input(s): CKTOTAL, CKMB, CKMBINDEX, TROPONINI in the last 72 hours.  BNP: BNP (last 3 results) Recent Labs    12/04/18 1743 12/28/18 1821  12/29/18 1943  BNP >4,500.0* >4,925.0* >4,500.0*    ProBNP (last 3 results) No results for input(s): PROBNP in the last 8760 hours.   D-Dimer No results for input(s): DDIMER in the last 72 hours. Hemoglobin A1C No results for input(s): HGBA1C in the last 72 hours. Fasting Lipid Panel No results for input(s): CHOL, HDL, LDLCALC, TRIG, CHOLHDL, LDLDIRECT in the last 72 hours. Thyroid Function Tests No results for input(s): TSH, T4TOTAL, T3FREE, THYROIDAB in the last 72 hours.  Invalid input(s): FREET3  Other results:   Imaging    No results found.   Medications:     Scheduled Medications: . ALPRAZolam  0.25 mg Oral Once  . amiodarone  200 mg Oral Daily  . Chlorhexidine Gluconate Cloth  6 each Topical Daily  . feeding supplement  1  Container Oral BID BM  . lactobacillus  1 g Oral TID WC  . mouth rinse  15 mL Mouth Rinse BID  . mometasone-formoterol  2 puff Inhalation BID  . multivitamin with minerals  1 tablet Oral Daily  . pantoprazole  40 mg Oral Daily  . sodium chloride flush  10-40 mL Intracatheter Q12H  . sodium chloride flush  3 mL Intravenous Once  . sodium chloride flush  3 mL Intravenous Q12H  . sodium chloride flush  3 mL Intravenous Q12H  . spironolactone  12.5 mg Oral QHS  . tamsulosin  0.8 mg Oral QPC supper    Infusions: . sodium chloride 10 mL/hr at 01/15/19 0800  . 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    Admitted with marked volume overload and cardiogenic shock .   Assessment/Plan   1. A/C Systolic Heart Failure -> cardiogenic shock: Due to primarily to nonischemic cardiomyopathy (out of proportion to CAD). [place] 2015 with moderate disease. Has Biotronik BiV ICD.  Initial CO-OX 38% so milrinone started on 1/28. ECHO 01/01/19 EF 10% with severe MR/TR, biatrial enlargement, RV with moderately reduced function.  - Continue mirlinone 0.25 mcg/kg/min. CO-OX 71%.  - CVP trending up 9-10 . Restart 20 mg torsemide.  - Discussed at VAD MRB meeting on 2/10 and would be possible candidate once RP bleed improves. PFTs today.  Anticipate starting torsemide 20 mg daily tomorrow.    2. RP bleed with symptomatic anemia - spontaneous while on heparin (? Traumatic component). Was on heparin for h/o LV clot - Rec'd 2u RBCs overnight - Hgb  trending down 8.8>8.3>8.1  - Place SCDs  3. CKD Stage III, creatinine baseline 1.8-2.4.   Creatinine down to 1.3. BMEt in am.   4. COPD on chronic home oxygen: He will need PFTs to assess degree of lung damage (ordered). No change.   5. L Kidney Cancer: Had cryoablation in 2015. No change.   6. PVCs: Started on amiodarone 2019 to suppress PVCs.  - Continue amiodarone 200 mg daily. No change.   7. 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 No change.   8. UTI, Klebsiella pneumoniae  - Completed rocephin course. No change.   9. Pulmonary nodule: 6 mm nodule in LUL noted on  CT scan. Recommended 6 month repeat non contrast CT. Dr Haroldine Laws discussed with Dr Prescott Gum and okay to move forward with VAD workup. No change.    He has been off coumadin since  2/1 due to RP bleed. Will discuss timing of restarting anticoagulant with Dr Haroldine Laws. On ECHO this admit,  there is not mention of clot.   Discussed with Dr Darcey Nora. He would like to see how well he progresses wit mobility and nutrition.   Continue PT.     Length of Stay: [place], NP  9:35 AM  Advanced Heart Failure Team Pager [phone] (M-F; 7a - 4p)  Please contact [facility] for night-coverage after hours (4p -7a ) and weekends on [email or web address]  Patient seen and examined with the above-signed Advanced Practice Provider and/or Housestaff. I personally reviewed laboratory data, imaging studies and relevant notes. I independently examined the patient and formulated the important aspects of the plan. I have edited the note to reflect any of my changes or salient points. I have personally discussed the plan with the patient and/or family.  He remains on milrinone for low output HF. Co-ox stable. Volume status ok. RP bleed seems stable. HGb ok. Constipated on exam. Will need sorbitol. Rhythm stable. I spoke with him and his family at length about timing of VAD. Will need to recover from RP  hemorrhage and be more mobile. I wonder if he would be best served by going to CIR for 1-2 weeks pre-VAD. Will d/w Dr. Prescott Gum. Can go to SDU.   Glori Bickers, MD  2:14 PM

## 2019-01-16 NOTE — Progress Notes (Signed)
CARDIAC REHAB PHASE I   PRE:  Rate/Rhythm: 93 SR pacing    BP: sitting 105/64    SaO2: 99 2L  MODE:  Ambulation: 140 ft   POST:  Rate/Rhythm: 102 pacing    BP: sitting 111/70     SaO2: 93 2L  Pt able to stand slowly and walk with RW and 2L. Weak, slow gait. Rested after 14 ft and saw friends. Talked for 2 min standing in hall then walked back to room. Exhausted by the time he reached room. Sts his chest and breathing are what bother him the most. Easily frustrated and particular in recliner after walk. Sts "I'm old and I just can't do everything." in regards to doing IS, using his arms to move objects, walk multiple times. Encouraged pt that we will work as a team but he has hard things ahead of him. Pt practiced IS x5 (1500-1750 mL) while I held it to his mouth. Declined elevating his feet. Will f/u. [phone]  [place], ACSM 01/16/2019 11:35 AM

## 2019-01-17 ENCOUNTER — Inpatient Hospital Stay (HOSPITAL_COMMUNITY): Payer: Medicare Other

## 2019-01-17 ENCOUNTER — Encounter (HOSPITAL_COMMUNITY): Payer: Self-pay | Admitting: Physical Medicine and Rehabilitation

## 2019-01-17 DIAGNOSIS — E871 Hypo-osmolality and hyponatremia: Secondary | ICD-10-CM

## 2019-01-17 DIAGNOSIS — I10 Essential (primary) hypertension: Secondary | ICD-10-CM

## 2019-01-17 DIAGNOSIS — D62 Acute posthemorrhagic anemia: Secondary | ICD-10-CM

## 2019-01-17 DIAGNOSIS — R0682 Tachypnea, not elsewhere classified: Secondary | ICD-10-CM

## 2019-01-17 DIAGNOSIS — Z01810 Encounter for preprocedural cardiovascular examination: Secondary | ICD-10-CM

## 2019-01-17 DIAGNOSIS — Z95810 Presence of automatic (implantable) cardiac defibrillator: Secondary | ICD-10-CM

## 2019-01-17 DIAGNOSIS — R000 Tachycardia, unspecified: Secondary | ICD-10-CM

## 2019-01-17 DIAGNOSIS — J449 Chronic obstructive pulmonary disease, unspecified: Secondary | ICD-10-CM

## 2019-01-17 LAB — BASIC METABOLIC PANEL
Anion gap: 12 (ref 5–15)
BUN: 28 mg/dL — AB (ref 8–23)
CO2: 28 mmol/L (ref 22–32)
Calcium: 8.1 mg/dL — ABNORMAL LOW (ref 8.9–10.3)
Chloride: 94 mmol/L — ABNORMAL LOW (ref 98–111)
Creatinine, Ser: 1.2 mg/dL (ref 0.61–1.24)
GFR calc Af Amer: >60 mL/min (ref >60)
GFR calc non Af Amer: 58 mL/min — ABNORMAL LOW (ref >60)
Glucose, Bld: 140 mg/dL — ABNORMAL HIGH (ref 70–99)
Potassium: 3.8 mmol/L (ref 3.5–5.1)
Sodium: 134 mmol/L — ABNORMAL LOW (ref 135–145)

## 2019-01-17 LAB — CBC
HCT: 25.7 % — ABNORMAL LOW (ref 39.0–52.0)
Hemoglobin: 8.3 g/dL — ABNORMAL LOW (ref 13.0–17.0)
MCH: 27.1 pg (ref 26.0–34.0)
MCHC: 32.3 g/dL (ref 30.0–36.0)
MCV: 84 fL (ref 80.0–100.0)
Platelets: 144 10*3/uL — ABNORMAL LOW (ref 150–400)
RBC: 3.06 MIL/uL — ABNORMAL LOW (ref 4.22–5.81)
RDW: 20.1 % — ABNORMAL HIGH (ref 11.5–15.5)
WBC: 10.2 10*3/uL (ref 4.0–10.5)
nRBC: 1.3 % — ABNORMAL HIGH (ref 0.0–0.2)

## 2019-01-17 LAB — MAGNESIUM: Magnesium: 2.1 mg/dL (ref 1.7–2.4)

## 2019-01-17 LAB — COOXEMETRY PANEL
Carboxyhemoglobin: 2.6 % — ABNORMAL HIGH (ref 0.5–1.5)
METHEMOGLOBIN: 1.4 % (ref 0.0–1.5)
O2 SAT: 65.2 %
Total hemoglobin: 8.1 g/dL — ABNORMAL LOW (ref 12.0–16.0)

## 2019-01-17 LAB — PROTIME-INR
INR: 1.32
Prothrombin Time: 16.3 seconds — ABNORMAL HIGH (ref 11.4–15.2)

## 2019-01-17 LAB — GLUCOSE, CAPILLARY: Glucose-Capillary: 183 mg/dL — ABNORMAL HIGH (ref 70–99)

## 2019-01-17 MED ORDER — ENOXAPARIN SODIUM 40 MG/0.4ML ~~LOC~~ SOLN: 40.0000 mg | SUBCUTANEOUS | Status: DC

## 2019-01-17 MED ORDER — FUROSEMIDE 10 MG/ML IJ SOLN
40.0000 mg | Freq: Two times a day (BID) | INTRAMUSCULAR | Status: DC
  Administered 2019-01-17 – 2019-01-18 (×3): 40 mg via INTRAVENOUS
  Filled 2019-01-17 (×3): qty 4

## 2019-01-17 MED ORDER — POTASSIUM CHLORIDE 20 MEQ/15ML (10%) PO SOLN
20.0000 meq | Freq: Two times a day (BID) | ORAL | Status: AC
  Administered 2019-01-17 (×2): 20 meq via ORAL
  Filled 2019-01-17 (×2): qty 15

## 2019-01-17 NOTE — Progress Notes (Addendum)
Patient ID: Spencer Rice, male   DOB: Apr 28, 1941, 78 y.o.   MRN: [id number]     Advanced Heart Failure Rounding Note  PCP-Cardiologist: No primary care provider on file.   Subjective:   Developed large spontaneous RP bleed on heparin 2/10. 2/11 received 2UPRBCs. Hgb stable 8.3 .   Remains on milrinone 0.25 mcg. CO-OX 65%.   Overnight complaining of left groin pain. Says he was in the chair and developed left groin pain and noticed a knot.   Denies SOB. Had a hard time sleeping. Abdominal pain about the same. "Dull ache"    [facility] 01/07/19  Ost RCA lesion is 50% stenosed.  Mid RCA lesion is 40% stenosed.  Prox LAD lesion is 50% stenosed.  Ost 2nd Diag to 2nd Diag lesion is 100% stenosed. Findings: Done on milrinone 0.25 mcg/kg/min RA = 7  RV = 62/8 PA = 65/26 (41) PCW = 19 Fick cardiac output/index = 5.6/2.7 PVR = 3.9 WU FA sat = 95% PA sat = 65%, 66% PaPI = 5.6 Assessment: 1. Mild non-obstructive CAD 2. Moderate PAH with normal PAPI 3. Normal CO on milrinone  ECHO 01/01/2019  EF 10% with severe MR, severe TR, Biatrial enlargement, RV moderately reduced function.   Objective:   Weight Range: 81.1 kg Body mass index is 23.59 kg/m.   Vital Signs:   Temp:  [98.4 F (36.9 C)-100.3 F (37.9 C)] 98.4 F (36.9 C) (02/14 0755) Pulse Rate:  [92-100] 100 (02/14 0755) Resp:  [18-30] 20 (02/14 0755) BP: (97-110)/(60-86) 99/66 (02/14 0755) SpO2:  [96 %-100 %] 96 % (02/14 0809) Weight:  [81.1 kg] 81.1 kg (02/14 0616) Last BM Date: 01/16/19  Weight change: Filed Weights   01/15/19 0500 01/16/19 0500 01/17/19 0616  Weight: 82.3 kg 79.1 kg 81.1 kg    Intake/Output:   Intake/Output Summary (Last 24 hours) at 01/17/2019 0853 Last data filed at 01/17/2019 0756 Gross per 24 hour  Intake 1171.52 ml  Output 1450 ml  Net -278.48 ml      Physical Exam  CVP 12  General:   No resp difficulty. In bed.  HEENT: normal Neck: supple. JVP 11-12  Carotids 2+ bilat; no  bruits. No lymphadenopathy or thryomegaly appreciated. Cor: PMI nondisplaced. Regular rate & rhythm. No rubs, gallops or murmurs. Lungs: clear Abdomen: soft, tender left quadrant. No hepatosplenomegaly. No bruits or masses. Good bowel sounds. Extremities: no cyanosis, clubbing, rash, R and LLE unna boots. L groin with induration and tenderness. RUE PICC  Neuro: alert & orientedx3, cranial nerves grossly intact. moves all 4 extremities w/o difficulty. Affect pleasant   Telemetry   V paced 90s Personally reviewed   EKG    No new tracings.   Labs    CBC Recent Labs    01/16/19 0543 01/16/19 1714 01/17/19 0523  WBC 10.5  --  10.2  HGB 8.1* 10.2* 8.3*  HCT 24.7* 30.0* 25.7*  MCV 84.9  --  84.0  PLT 122*  --  694*   Basic Metabolic Panel Recent Labs    01/16/19 0543 01/16/19 1714 01/17/19 0523  NA 133* 132* 134*  K 4.2 3.8 3.8  CL 95*  --  94*  CO2 27  --  28  GLUCOSE 164*  --  140*  BUN 33*  --  28*  CREATININE 1.37*  --  1.20  CALCIUM 8.4*  --  8.1*  MG 2.2  --  2.1   Liver Function Tests No results for input(s): AST, ALT, ALKPHOS, BILITOT,  PROT, ALBUMIN in the last 72 hours. No results for input(s): LIPASE, AMYLASE in the last 72 hours. Cardiac Enzymes No results for input(s): CKTOTAL, CKMB, CKMBINDEX, TROPONINI in the last 72 hours.  BNP: BNP (last 3 results) Recent Labs    12/04/18 1743 12/28/18 1821 12/29/18 1943  BNP >4,500.0* >4,925.0* >4,500.0*    ProBNP (last 3 results) No results for input(s): PROBNP in the last 8760 hours.   D-Dimer No results for input(s): DDIMER in the last 72 hours. Hemoglobin A1C No results for input(s): HGBA1C in the last 72 hours. Fasting Lipid Panel No results for input(s): CHOL, HDL, LDLCALC, TRIG, CHOLHDL, LDLDIRECT in the last 72 hours. Thyroid Function Tests No results for input(s): TSH, T4TOTAL, T3FREE, THYROIDAB in the last 72 hours.  Invalid input(s): FREET3  Other results:   Imaging    No results  found.   Medications:     Scheduled Medications: . ALPRAZolam  0.25 mg Oral Once  . amiodarone  200 mg Oral Daily  . Chlorhexidine Gluconate Cloth  6 each Topical Daily  . enoxaparin (LOVENOX) injection  40 mg Subcutaneous Q24H  . feeding supplement  1 Container Oral BID BM  . lactobacillus  1 g Oral TID WC  . mouth rinse  15 mL Mouth Rinse BID  . mometasone-formoterol  2 puff Inhalation BID  . multivitamin with minerals  1 tablet Oral Daily  . pantoprazole  40 mg Oral Daily  . sodium chloride flush  10-40 mL Intracatheter Q12H  . sodium chloride flush  3 mL Intravenous Once  . sodium chloride flush  3 mL Intravenous Q12H  . sodium chloride flush  3 mL Intravenous Q12H  . spironolactone  12.5 mg Oral QHS  . tamsulosin  0.8 mg Oral QPC supper  . torsemide  20 mg Oral Daily    Infusions: . sodium chloride 10 mL/hr at 01/15/19 0800  . 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    Admitted with marked volume overload and cardiogenic shock .   Assessment/Plan   1. A/C Systolic Heart Failure -> cardiogenic shock: Due to primarily to nonischemic cardiomyopathy (out of proportion to CAD). [place] 2015 with moderate disease. Has Biotronik BiV ICD.  Initial CO-OX 38% so milrinone started on 1/28. ECHO 01/01/19 EF 10% with severe MR/TR, biatrial enlargement, RV with moderately reduced function.  - Continue mirlinone 0.25 mcg/kg/min. CO-OX 65%.  -CVP  Trending up 11-12. Stop torsemide. Start IV lasix 40 mg twice a day.  -  Discussed at [place] on 2/10 and would be possible candidate once RP bleed improves.  2. RP bleed with symptomatic anemia - spontaneous while on heparin (? Traumatic component). Was on heparin for h/o LV clot - Received 2UPRBCs on 2/10  - Hgb stable at 8.3 today.  - Continue SCDs in bed. Start lovenox 40 daily  3. CKD Stage III, creatinine baseline 1.8-2.4.   Creatinine down to 1.2  - Check BMET in am.   4. COPD on chronic home oxygen: He will need PFTs to assess degree of lung damage (ordered). No change.  5. L Kidney Cancer: Had cryoablation in 2015. No change.   6. PVCs: Started on amiodarone 2019 to suppress PVCs.  - Continue amiodarone 200 mg daily. No change.   7. 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 No change.   8. UTI, Klebsiella pneumoniae  - Completed rocephin course. No change.   9. Pulmonary nodule: 6 mm nodule in LUL noted on CT scan. Recommended 6 month repeat non contrast CT. Dr Haroldine Laws discussed with Dr Prescott Gum and okay to move forward with VAD workup. No change.   10. Left groin  Tender/indurate. ? Extension of hematoma.  May need [place]. Will discuss with Dr Vaughan Browner   11. H/O Mural Thrombus Off coumadin due to RP. Hold off on lovenox with left groin tenderness. Maybe able to start tomorrow.    Consult CIR . PT following.   Length of Stay: [facility], NP  8:53 AM  Advanced Heart Failure Team Pager [phone] (M-F; 7a - 4p)  Please contact [facility] for night-coverage after hours (4p -7a ) and weekends on [email or web address]  Patient seen and examined with the above-signed Advanced Practice Provider and/or Housestaff. I personally reviewed laboratory data, imaging studies and relevant notes. I independently examined the patient and formulated the important aspects of the plan. I have edited the note to reflect any of my changes or salient points. I have personally discussed the plan with the patient and/or  family.  Remains on milrinone. Co-ox 65%. Volume status elevated. Agree with IV lasix. Off heparin due to RP bleed. Hgb stable now. Start SQ lasix for DVT prophylaxis. C/o pain in flank and groin. Appears to have extension of RP bleed into left inguinal sac. Does not appear incarcerated. Continue conservative management. CIR has seen and recommended HHPT. Will probably need eventual d/c to home with milrinone followed by VAD in several weeks.   Glori Bickers, MD  7:40 PM

## 2019-01-17 NOTE — Consult Note (Signed)
Physical Medicine and Rehabilitation Consult   Reason for Consult: Debility Referring Physician: Dr. Mahalia Longest   HPI: Spencer Rice is a 78 y.o. male with history of renal cancer, HTN, DVT, COPD- Oxygen dependent, CKD ,  Apical mural thrombus- on coumadin, advanced heart failure s/p ICD  with multiple admissions in the past couple of months with worsening of SOB due to acute exacerbations of CHF with cardiogenic shock.  History taken from chart review and patient.  He was treated with IV diuresis and started on milrinone with improvement.  2 D echo showed EF 10% with severe TR and he underwent L/R cardiac cath 2/4 to evaluate revealing mild non-obstructive CAD with moderate PAH.  Dr Prescott Gum consulted and felt that patient was a potential candidate and full evaluation initiated to determine patient's potential. He did develop severe left groin pain on 2/10 and was found to have large left retroperitoneal hematoma with drop in Hgb to 7.2 requiring 2 units PRBC.  BLE edema treated with unna boots.  Hospital course significant for urinary retention, Klebsiella UTI, as well as hypoxia with activity. MD recommending CIR to help with optimization to help with decision on LVAD.    Review of Systems  Constitutional: Negative for chills and fever.  HENT: Negative for hearing loss and tinnitus.   Eyes: Negative for blurred vision and double vision.  Respiratory: Positive for shortness of breath. Negative for cough.   Cardiovascular: Positive for leg swelling. Negative for chest pain and palpitations.  Gastrointestinal: Positive for constipation. Negative for abdominal pain, heartburn and vomiting.  Genitourinary: Negative for dysuria and urgency.  Musculoskeletal: Negative for back pain, joint pain and myalgias.  Skin: Negative for itching and rash.  Neurological: Positive for sensory change (bilateral hands occasionally).  Psychiatric/Behavioral: Negative for memory loss. The patient does  not have insomnia.   All other systems reviewed and are negative.     Past Medical History:  Diagnosis Date  . AICD (automatic cardioverter/defibrillator) present   . Cancer of left kidney ([place])    S/P OR 05/2014  . CHF (congestive heart failure) ([place])   . Coronary artery disease   . DVT (deep venous thrombosis) ([facility]) 1983   LLE  . GERD (gastroesophageal reflux disease)   . History of hiatal hernia   . Hypertension     Past Surgical History:  Procedure Laterality Date  . CARDIAC CATHETERIZATION  1980's  . IMPLANTABLE CARDIOVERTER DEFIBRILLATOR IMPLANT  07/21/2010  . IR GENERIC HISTORICAL  12/23/2014   IR RADIOLOGIST EVAL & MGMT 12/23/2014 [name], MD [facility] INTERV RAD  . IR GENERIC HISTORICAL  07/20/2016   IR RADIOLOGIST EVAL & MGMT 07/20/2016 [facility] INTERV RAD  . IR RADIOLOGIST EVAL & MGMT  07/04/2017  . RENAL CRYOABLATION Left 05/2014  . RIGHT/LEFT HEART CATH AND CORONARY ANGIOGRAPHY N/A 01/07/2019   Procedure: RIGHT/LEFT HEART CATH AND CORONARY ANGIOGRAPHY;  Surgeon: Jolaine Artist, MD;  Location: [facility];  Service: Cardiovascular;  Laterality: N/A;    Family History  Problem Relation Age of Onset  . Heart disease Father   . Heart attack Father 8  . Hypertension Father   . Hypertension Mother     Social History:  Married. Independent but sedentary for the past month due to SOB.  He reports that he quit smoking about 42 years ago. His smoking use included cigarettes. He started smoking about 59 years ago. He has a 12.75 pack-year smoking history. He has never used smokeless  tobacco. He reports that he does not drink alcohol or use drugs.    Allergies: No Known Allergies    Medications Prior to Admission  Medication Sig Dispense Refill  . amiodarone (PACERONE) 200 MG tablet Take 200 mg by mouth daily.    Marland Kitchen aspirin 81 MG chewable tablet Chew 81 mg by mouth.    . carvedilol (COREG) 12.5 MG tablet Take 6.25 mg by mouth 2 (two) times daily. Taking 1/2  tablet (6.41m) twice daily    . esomeprazole (NEXIUM) 20 MG capsule Take 20 mg by mouth daily as needed (FOR HEARTBURN).    .Marland KitchenJARDIANCE 10 MG TABS tablet Take 10 mg by mouth daily.  1  . mometasone-formoterol (DULERA) 200-5 MCG/ACT AERO Inhale 2 puffs into the lungs 2 (two) times daily.    . Multiple Vitamin (MULTIVITAMIN WITH MINERALS) TABS tablet Take 1 tablet by mouth daily.    .Marland Kitchenomeprazole (PRILOSEC) 20 MG capsule Take 20 mg by mouth daily.    . potassium chloride (K-DUR,KLOR-CON) 10 MEQ tablet Take 10 mEq by mouth every other day.    . sucralfate (CARAFATE) 1 g tablet Take 1 tablet (1 g total) by mouth 4 (four) times daily -  with meals and at bedtime. 28 tablet 0  . tamsulosin (FLOMAX) 0.4 MG CAPS capsule Take 0.4 mg by mouth.    . torsemide (DEMADEX) 20 MG tablet Take 40 mg by mouth 2 (two) times daily.    .Marland Kitchenwarfarin (COUMADIN) 5 MG tablet Take 2.5 mg by mouth daily. Take 2.5 mg daily except 5 mg Thursday    . benzonatate (TESSALON) 100 MG capsule Take 1 capsule (100 mg total) by mouth every 8 (eight) hours. (Patient not taking: Reported on 12/30/2018) 21 capsule 0  . carvedilol (COREG) 3.125 MG tablet Take 1 tablet (3.125 mg total) by mouth 2 (two) times daily with a meal. (Patient not taking: Reported on 12/30/2018) 60 tablet 1  . torsemide (DEMADEX) 20 MG tablet Take 1.5 tablet (333m in AM and 1 table (2066min PM daily (Patient not taking: Reported on 12/30/2018) 75 tablet 1    Home: Home Living Family/patient expects to be discharged to:: Private residence Living Arrangements: Spouse/significant other Available Help at Discharge: Family Type of Home: House Home Access: Level entry Home Layout: One level Bathroom Shower/Tub: TubChiropodisttandard Home Equipment: WalEnvironmental consultant2 wheels, [place] Centersingle point, Other (comment)(home O2 )  Functional History: Prior Function Level of Independence: Independent Functional Status:  Mobility: Bed Mobility General bed  mobility comments: Up in chair Transfers Overall transfer level: Needs assistance Equipment used: Rolling walker (2 wheeled) Transfers: Sit to/from Stand Sit to Stand: Min guard General transfer comment: Assist for safety and lines Ambulation/Gait Ambulation/Gait assistance: Supervision, Min guard Gait Distance (Feet): 140 Feet Assistive device: Rolling walker (2 wheeled) Gait Pattern/deviations: Step-through pattern, Decreased stride length, Decreased step length - right, Decreased step length - left General Gait Details: Assist for safety and lines. Pt amb on 4L of O2 with SpO2 >94%. Pt required 2 standing rest breaks. As pt fatigued required min guard and demonstrated shortened step length. Pt reported some light headedness with amb. BP seated after amb 114/74 Gait velocity: decreased  Gait velocity interpretation: 1.31 - 2.62 ft/sec, indicative of limited community ambulator    ADL:    Cognition: Cognition Overall Cognitive Status: Within Functional Limits for tasks assessed Orientation Level: Oriented X4 Cognition Arousal/Alertness: Awake/alert Behavior During Therapy: Flat affect Overall Cognitive Status: Within Functional  Limits for tasks assessed  Blood pressure 99/66, pulse 100, temperature 98.4 F (36.9 C), temperature source Oral, resp. rate 20, height '6\' 1"'$  (1.854 m), weight 81.1 kg, SpO2 96 %. Physical Exam  Nursing note and vitals reviewed. Constitutional: He is oriented to person, place, and time. He appears well-developed and well-nourished.  HENT:  Head: Normocephalic and atraumatic.  Eyes: EOM are normal. Right eye exhibits no discharge. Left eye exhibits no discharge.  Neck: Normal range of motion. Neck supple.  Cardiovascular: Regular rhythm.  Murmur heard. + Tachycardia  Respiratory: Breath sounds normal. No stridor.  + Tachypnea + [place]  GI: Soft. Bowel sounds are normal.  Musculoskeletal:     Comments: BLE with unna boots.   Neurological: He is alert  and oriented to person, place, and time.  Flat affect with delayed answers.  He was able to follow simple motor commands without difficulty.  Motor: Bilateral upper extremities: 5/5 proximal distal Bilateral lower extremities: Hip flexion 3+-4-/5, knee extension 4/5, ankle dorsiflexion 5/5  Skin:  Bilateral lower extremities with dressing C/D/I  Psychiatric: He has a normal mood and affect. His behavior is normal.    Results for orders placed or performed during the hospital encounter of 12/29/18 (from the past 24 hour(s))  I-STAT 7, (LYTES, BLD GAS, ICA, H+H)     Status: Abnormal   Collection Time: 01/16/19  5:14 PM  Result Value Ref Range   pH, Arterial 7.492 (H) 7.350 - 7.450   pCO2 arterial 35.6 32.0 - 48.0 mmHg   pO2, Arterial 89.0 83.0 - 108.0 mmHg   Bicarbonate 27.2 20.0 - 28.0 mmol/L   TCO2 28 22 - 32 mmol/L   O2 Saturation 98.0 %   Acid-Base Excess 4.0 (H) 0.0 - 2.0 mmol/L   Sodium 132 (L) 135 - 145 mmol/L   Potassium 3.8 3.5 - 5.1 mmol/L   Calcium, Ion 1.11 (L) 1.15 - 1.40 mmol/L   HCT 30.0 (L) 39.0 - 52.0 %   Hemoglobin 10.2 (L) 13.0 - 17.0 g/dL   Patient temperature HIDE    Collection site RADIAL, ALLEN'S TEST ACCEPTABLE    Drawn by RT    Sample type ARTERIAL   Cooxemetry Panel (carboxy, met, total hgb, O2 sat)     Status: Abnormal   Collection Time: 01/17/19  4:55 AM  Result Value Ref Range   Total hemoglobin 8.1 (L) 12.0 - 16.0 g/dL   O2 Saturation 65.2 %   Carboxyhemoglobin 2.6 (H) 0.5 - 1.5 %   Methemoglobin 1.4 0.0 - 1.5 %  Protime-INR     Status: Abnormal   Collection Time: 01/17/19  5:23 AM  Result Value Ref Range   Prothrombin Time 16.3 (H) 11.4 - 15.2 seconds   INR 1.32   Magnesium     Status: None   Collection Time: 01/17/19  5:23 AM  Result Value Ref Range   Magnesium 2.1 1.7 - 2.4 mg/dL  Basic metabolic panel     Status: Abnormal   Collection Time: 01/17/19  5:23 AM  Result Value Ref Range   Sodium 134 (L) 135 - 145 mmol/L   Potassium 3.8 3.5  - 5.1 mmol/L   Chloride 94 (L) 98 - 111 mmol/L   CO2 28 22 - 32 mmol/L   Glucose, Bld 140 (H) 70 - 99 mg/dL   BUN 28 (H) 8 - 23 mg/dL   Creatinine, Ser 1.20 0.61 - 1.24 mg/dL   Calcium 8.1 (L) 8.9 - 10.3 mg/dL   GFR calc non  Af Amer 58 (L) >60 mL/min   GFR calc Af Amer >60 >60 mL/min   Anion gap 12 5 - 15  CBC     Status: Abnormal   Collection Time: 01/17/19  5:23 AM  Result Value Ref Range   WBC 10.2 4.0 - 10.5 K/uL   RBC 3.06 (L) 4.22 - 5.81 MIL/uL   Hemoglobin 8.3 (L) 13.0 - 17.0 g/dL   HCT 25.7 (L) 39.0 - 52.0 %   MCV 84.0 80.0 - 100.0 fL   MCH 27.1 26.0 - 34.0 pg   MCHC 32.3 30.0 - 36.0 g/dL   RDW 20.1 (H) 11.5 - 15.5 %   Platelets 144 (L) 150 - 400 K/uL   nRBC 1.3 (H) 0.0 - 0.2 %   No results found.  Assessment/Plan: Diagnosis: Debility Labs independently reviewed.  Records reviewed and summated above.  1. Does the need for close, 24 hr/day medical supervision in concert with the patient's rehab needs make it unreasonable for this patient to be served in a less intensive setting? No  2. Co-Morbidities requiring supervision/potential complications: urinary retention, Klebsiella UTI (continue ABX), renal cancer, HTN (monitor and provide prns in accordance with increased physical exertion and pain), DVT (continue meds), COPD- Oxygen dependent, CKD avoid nephrotoxic meds),  Apical mural thrombus, advanced heart failure s/p ICD, tachypnea (monitor RR and O2 Sats with increased physical exertion), Tachycardia (monitor in accordance with pain and increasing activity), hyponatremia (repeat labs, treat if necessary), ABLA (repeat labs, Hemoccult stools, transfuse to ensure appropriate perfusion for increased activity tolerance) 3. Due to safety, skin/wound care, disease management and patient education, does the patient require 24 hr/day rehab nursing? Potentially 4. Does the patient require coordinated care of a physician, rehab nurse, PT (1-2 hrs/day, 5 days/week) and OT (1-2 hrs/day,  5 days/week) to address physical and functional deficits in the context of the above medical diagnosis(es)? No Addressing deficits in the following areas: balance, endurance, locomotion, strength, transferring, bathing, dressing, cognition and psychosocial support 5. Can the patient actively participate in an intensive therapy program of at least 3 hrs of therapy per day at least 5 days per week? Yes 6. The potential for patient to make measurable gains while on inpatient rehab is good and fair 7. Anticipated functional outcomes upon discharge from inpatient rehab are modified independent and supervision  with PT, modified independent and supervision with OT, n/a with SLP. 8. Estimated rehab length of stay to reach the above functional goals is: NA 9. Anticipated D/C setting: Home 10. Anticipated post D/C treatments: HH therapy and Home excercise program 11. Overall Rehab/Functional Prognosis: good  RECOMMENDATIONS: This patient's condition is appropriate for continued rehabilitative care in the following setting: Patient progressing well functionally.  Recommend home with home health. Patient has agreed to participate in recommended program. Potentially Note that insurance prior authorization may be required for reimbursement for recommended care.  Comment: Rehab Admissions Coordinator to follow up.   I have personally performed a face to face diagnostic evaluation, including, but not limited to relevant history and physical exam findings, of this patient and developed relevant assessment and plan.  Additionally, I have reviewed and concur with the physician assistant's documentation above.   Delice Lesch, MD, ABPMR Bary Leriche, PA-C 01/17/2019

## 2019-01-17 NOTE — Progress Notes (Signed)
PT Cancellation Note  Patient Details Name: Spencer Rice MRN: [id number] DOB: 06/23/1941   Cancelled Treatment:    Reason Eval/Treat Not Completed: Other (comment). Pt declined stating, " I'm not able right now." Will re-attempt later.   Shary Decamp [facility] 01/17/2019, 9:17 AM [place] Pager [phone] Office [phone]

## 2019-01-17 NOTE — Progress Notes (Signed)
*  PRELIMINARY RESULTS* Vascular Ultrasound Bilateral lower extremity venous duplex has been completed.  Please see CV Proc tab for preliminary results.  Spencer Rice 01/17/2019, 11:25 AM

## 2019-01-17 NOTE — Care Management Important Message (Signed)
Important Message  Patient Details  Name: Spencer Rice MRN: [id number] Date of Birth: 09/19/1941   Medicare Important Message Given:  Yes    Harbert Fitterer P [place] 01/17/2019, 11:43 AM

## 2019-01-17 NOTE — Progress Notes (Signed)
Pt arrived to unit with all personal belongings. VS stable. Resting comfortably with no complaints. Will continue to monitor.

## 2019-01-18 LAB — BASIC METABOLIC PANEL
ANION GAP: 13 (ref 5–15)
BUN: 29 mg/dL — ABNORMAL HIGH (ref 8–23)
CHLORIDE: 91 mmol/L — AB (ref 98–111)
CO2: 27 mmol/L (ref 22–32)
Calcium: 8.7 mg/dL — ABNORMAL LOW (ref 8.9–10.3)
Creatinine, Ser: 1.32 mg/dL — ABNORMAL HIGH (ref 0.61–1.24)
GFR calc Af Amer: 60 mL/min — ABNORMAL LOW (ref >60)
GFR calc non Af Amer: 52 mL/min — ABNORMAL LOW (ref >60)
Glucose, Bld: 136 mg/dL — ABNORMAL HIGH (ref 70–99)
Potassium: 4.3 mmol/L (ref 3.5–5.1)
Sodium: 131 mmol/L — ABNORMAL LOW (ref 135–145)

## 2019-01-18 LAB — CBC
HCT: 27.8 % — ABNORMAL LOW (ref 39.0–52.0)
Hemoglobin: 9 g/dL — ABNORMAL LOW (ref 13.0–17.0)
MCH: 26.9 pg (ref 26.0–34.0)
MCHC: 32.4 g/dL (ref 30.0–36.0)
MCV: 83.2 fL (ref 80.0–100.0)
Platelets: 174 10*3/uL (ref 150–400)
RBC: 3.34 MIL/uL — ABNORMAL LOW (ref 4.22–5.81)
RDW: 20 % — ABNORMAL HIGH (ref 11.5–15.5)
WBC: 10.2 10*3/uL (ref 4.0–10.5)
nRBC: 1.1 % — ABNORMAL HIGH (ref 0.0–0.2)

## 2019-01-18 LAB — COOXEMETRY PANEL
Carboxyhemoglobin: 2.5 % — ABNORMAL HIGH (ref 0.5–1.5)
Methemoglobin: 1.6 % — ABNORMAL HIGH (ref 0.0–1.5)
O2 Saturation: 69.7 %
TOTAL HEMOGLOBIN: 8.7 g/dL — AB (ref 12.0–16.0)

## 2019-01-18 LAB — PROTIME-INR
INR: 1.29
Prothrombin Time: 15.9 seconds — ABNORMAL HIGH (ref 11.4–15.2)

## 2019-01-18 LAB — MAGNESIUM: Magnesium: 2.2 mg/dL (ref 1.7–2.4)

## 2019-01-18 MED ORDER — FUROSEMIDE 10 MG/ML IJ SOLN
80.0000 mg | Freq: Two times a day (BID) | INTRAMUSCULAR | Status: DC
  Administered 2019-01-18 – 2019-01-19 (×3): 80 mg via INTRAVENOUS
  Filled 2019-01-18 (×4): qty 8

## 2019-01-18 NOTE — Progress Notes (Signed)
Patient ID: Spencer Rice, male   DOB: [birth year], 78 y.o.   MRN: [id number]     Advanced Heart Failure Rounding Note  PCP-Cardiologist: No primary care provider on file.   Subjective:   Developed large spontaneous RP bleed on heparin 2/10. 2/11 received 2UPRBCs. Hgb stable 8.3 .   Remains on milrinone 0.25 mcg. CO-OX 69%.   Feels ok. Groin soreness improved. Evaluated by CIR and felt not to be a candidate for CIR. CVP remains 13. Good BM this am. No orthopnea, PND or CP.    [facility] 01/07/19  Ost RCA lesion is 50% stenosed.  Mid RCA lesion is 40% stenosed.  Prox LAD lesion is 50% stenosed.  Ost 2nd Diag to 2nd Diag lesion is 100% stenosed. Findings: Done on milrinone 0.25 mcg/kg/min RA = 7  RV = 62/8 PA = 65/26 (41) PCW = 19 Fick cardiac output/index = 5.6/2.7 PVR = 3.9 WU FA sat = 95% PA sat = 65%, 66% PaPI = 5.6 Assessment: 1. Mild non-obstructive CAD 2. Moderate PAH with normal PAPI 3. Normal CO on milrinone  ECHO 01/01/2019  EF 10% with severe MR, severe TR, Biatrial enlargement, RV moderately reduced function.   Objective:   Weight Range: 82 kg Body mass index is 23.85 kg/m.   Vital Signs:   Temp:  [97.8 F (36.6 C)-98.7 F (37.1 C)] 98.1 F (36.7 C) (02/15 0831) Pulse Rate:  [89-94] 94 (02/15 0831) Resp:  [18-32] 21 (02/15 0831) BP: (83-106)/(59-82) 104/65 (02/15 0831) SpO2:  [98 %-100 %] 98 % (02/15 0905) Weight:  [82 kg] 82 kg (02/15 0629) Last BM Date: 01/18/19  Weight change: Filed Weights   01/16/19 0500 01/17/19 0616 01/18/19 0629  Weight: 79.1 kg 81.1 kg 82 kg    Intake/Output:   Intake/Output Summary (Last 24 hours) at 01/18/2019 1022 Last data filed at 01/18/2019 0900 Gross per 24 hour  Intake 983.48 ml  Output 850 ml  Net 133.48 ml      Physical Exam   General:  Lying in bed. No resp difficulty HEENT: normal Neck: supple. JVP to ear Carotids 2+ bilat; no bruits. No lymphadenopathy or thryomegaly appreciated. Cor: PMI  nondisplaced. Regular rate & rhythm.2/6 MR/TR  Lungs: clear Abdomen: soft, nontender, mildly distended. No hepatosplenomegaly. No bruits or masses. Good bowel sounds. Extremities: no cyanosis, clubbing, rash, trace edema  Mildly tender over left inguinal sack Neuro: alert & orientedx3, cranial nerves grossly intact. moves all 4 extremities w/o difficulty. Affect pleasant   Telemetry    V paced 90s Personally reviewed   Labs    CBC Recent Labs    01/17/19 0523 01/18/19 0235  WBC 10.2 10.2  HGB 8.3* 9.0*  HCT 25.7* 27.8*  MCV 84.0 83.2  PLT 144* 008   Basic Metabolic Panel Recent Labs    01/17/19 0523 01/18/19 0235  NA 134* 131*  K 3.8 4.3  CL 94* 91*  CO2 28 27  GLUCOSE 140* 136*  BUN 28* 29*  CREATININE 1.20 1.32*  CALCIUM 8.1* 8.7*  MG 2.1 2.2   Liver Function Tests No results for input(s): AST, ALT, ALKPHOS, BILITOT, PROT, ALBUMIN in the last 72 hours. No results for input(s): LIPASE, AMYLASE in the last 72 hours. Cardiac Enzymes No results for input(s): CKTOTAL, CKMB, CKMBINDEX, TROPONINI in the last 72 hours.  BNP: BNP (last 3 results) Recent Labs    12/04/18 1743 12/28/18 1821 12/29/18 1943  BNP >4,500.0* >4,925.0* >4,500.0*    ProBNP (last 3 results) No results for  input(s): PROBNP in the last 8760 hours.   D-Dimer No results for input(s): DDIMER in the last 72 hours. Hemoglobin A1C No results for input(s): HGBA1C in the last 72 hours. Fasting Lipid Panel No results for input(s): CHOL, HDL, LDLCALC, TRIG, CHOLHDL, LDLDIRECT in the last 72 hours. Thyroid Function Tests No results for input(s): TSH, T4TOTAL, T3FREE, THYROIDAB in the last 72 hours.  Invalid input(s): FREET3  Other results:   Imaging    Vas US Doppler Pre Vad  Result Date: 01/17/2019 PERIOPERATIVE VASCULAR EVALUATION Indications: Pre Op. Performing Technologist: June Leap Rdms, Rvt  Examination Guidelines: A complete evaluation includes B-mode imaging, spectral  Doppler, color Doppler, and power Doppler as needed of all accessible portions of each vessel. Bilateral testing is considered an integral part of a complete examination. Limited examinations for reoccurring indications may be performed as noted.  Right Carotid Findings: +----------+--------+--------+--------+---------------------+--------+           PSV cm/sEDV cm/sStenosisDescribe             Comments +----------+--------+--------+--------+---------------------+--------+ CCA Prox  71      17                                            +----------+--------+--------+--------+---------------------+--------+ CCA Distal32      11              focal and hyperechoic         +----------+--------+--------+--------+---------------------+--------+ ICA Prox  39      12                                            +----------+--------+--------+--------+---------------------+--------+ ICA Mid   58      20                                            +----------+--------+--------+--------+---------------------+--------+ ICA Distal106     39                                            +----------+--------+--------+--------+---------------------+--------+ ECA       93      11                                            +----------+--------+--------+--------+---------------------+--------+ Portions of this table do not appear on this page. +----------+--------+-------+--------+------------+           PSV cm/sEDV cmsDescribeArm Pressure +----------+--------+-------+--------+------------+ Subclavian89                                  +----------+--------+-------+--------+------------+ +---------+--------+--+--------+--+---------+ VertebralPSV cm/s72EDV cm/s21Antegrade +---------+--------+--+--------+--+---------+ Left Carotid Findings: +----------+--------+--------+--------+---------------------+--------+           PSV cm/sEDV cm/sStenosisDescribe              Comments +----------+--------+--------+--------+---------------------+--------+ CCA Prox  67      14                                            +----------+--------+--------+--------+---------------------+--------+  CCA Distal48      9               focal and hyperechoic         +----------+--------+--------+--------+---------------------+--------+ ICA Prox  63      23                                            +----------+--------+--------+--------+---------------------+--------+ ICA Distal76      17                                            +----------+--------+--------+--------+---------------------+--------+ ECA       69      10                                            +----------+--------+--------+--------+---------------------+--------+ +----------+--------+--------+--------+------------+ SubclavianPSV cm/sEDV cm/sDescribeArm Pressure +----------+--------+--------+--------+------------+           89                      105          +----------+--------+--------+--------+------------+ +---------+--------+--+--------+--+---------+ VertebralPSV cm/s78EDV cm/s20Antegrade +---------+--------+--+--------+--+---------+  ABI Findings: +--------+------------------+-----+---------+----------------------------------+ Right   Rt Pressure (mmHg)IndexWaveform Comment                            +--------+------------------+-----+---------+----------------------------------+ Brachial                       triphasicBP unable to take due to PICC line +--------+------------------+-----+---------+----------------------------------+ ATA     123               1.17 triphasic                                   +--------+------------------+-----+---------+----------------------------------+ PTA     150               1.43 triphasic                                   +--------+------------------+-----+---------+----------------------------------+  +--------+------------------+-----+---------+-------+ Left    Lt Pressure (mmHg)IndexWaveform Comment +--------+------------------+-----+---------+-------+ [id number]                    triphasic        +--------+------------------+-----+---------+-------+ ATA     125               1.19 triphasic        +--------+------------------+-----+---------+-------+ PTA     111               1.06 triphasic        +--------+------------------+-----+---------+-------+  Summary: Right Carotid: Velocities in the right ICA are consistent with a 1-39% stenosis. Left Carotid: Velocities in the left ICA are consistent with a 1-39% stenosis. Vertebrals:  Bilateral vertebral arteries demonstrate antegrade flow. Subclavians: Normal flow hemodynamics were seen in bilateral subclavian              arteries.  *See table(s) above  for measurements and observations. Right ABI: Resting right ankle-brachial index indicates noncompressible right lower extremity arteries. May be a false reading as waveforms normal. Left ABI: Resting left ankle-brachial index is within normal range. No evidence of significant left lower extremity arterial disease.     Preliminary    Vas [place] Lower Extremity Venous (dvt)  Result Date: 01/17/2019  Lower Venous Study Indications: Pre-op.  Performing Technologist: Antonieta Pert RDMS, RVT  Examination Guidelines: A complete evaluation includes B-mode imaging, spectral Doppler, color Doppler, and power Doppler as needed of all accessible portions of each vessel. Bilateral testing is considered an integral part of a complete examination. Limited examinations for reoccurring indications may be performed as noted.  Right Venous Findings: +---------+---------------+---------+-----------+----------+-------+          CompressibilityPhasicitySpontaneityPropertiesSummary +---------+---------------+---------+-----------+----------+-------+ CFV      Full           Yes      Yes                           +---------+---------------+---------+-----------+----------+-------+ SFJ      Full                                                 +---------+---------------+---------+-----------+----------+-------+ FV Prox  Full                                                 +---------+---------------+---------+-----------+----------+-------+ FV Mid   Full                                                 +---------+---------------+---------+-----------+----------+-------+ FV DistalFull                                                 +---------+---------------+---------+-----------+----------+-------+ PFV      Full                                                 +---------+---------------+---------+-----------+----------+-------+ POP      Full           Yes      Yes                          +---------+---------------+---------+-----------+----------+-------+ PTV      Full                                                 +---------+---------------+---------+-----------+----------+-------+ PERO     Full                                                 +---------+---------------+---------+-----------+----------+-------+  GSV      Full                                                 +---------+---------------+---------+-----------+----------+-------+ Pitting edema in right calf.  Left Venous Findings: +---------+---------------+---------+-----------+----------+-------------------+          CompressibilityPhasicitySpontaneityPropertiesSummary             +---------+---------------+---------+-----------+----------+-------------------+ CFV                     Yes      Yes                  could not tolerate                                                        compression         +---------+---------------+---------+-----------+----------+-------------------+ SFJ                     Yes      Yes                                       +---------+---------------+---------+-----------+----------+-------------------+ FV Prox  Full                                                             +---------+---------------+---------+-----------+----------+-------------------+ FV Mid   Full                                                             +---------+---------------+---------+-----------+----------+-------------------+ FV DistalFull                                                             +---------+---------------+---------+-----------+----------+-------------------+ PFV      Full                                                             +---------+---------------+---------+-----------+----------+-------------------+ POP      Full           Yes      Yes                                      +---------+---------------+---------+-----------+----------+-------------------+ PTV      Full                                                             +---------+---------------+---------+-----------+----------+-------------------+  PERO     Full                                                             +---------+---------------+---------+-----------+----------+-------------------+ GSV      Partial                                                          +---------+---------------+---------+-----------+----------+-------------------+ Patient could not tolerate compression in the left groin area.    Summary: Right: There is no evidence of deep vein thrombosis in the lower extremity. No cystic structure found in the popliteal fossa. Left: There is no evidence of deep vein thrombosis in the lower extremity. A cystic structure is found in the popliteal fossa. Complex bakers cyst noted in left popliteal fossa.  *See table(s) above for measurements and observations.    Preliminary      Medications:     Scheduled Medications: . ALPRAZolam  0.25 mg Oral Once  . amiodarone  200 mg Oral Daily  .  Chlorhexidine Gluconate Cloth  6 each Topical Daily  . feeding supplement  1 Container Oral BID BM  . furosemide  40 mg Intravenous BID  . lactobacillus  1 g Oral TID WC  . mouth rinse  15 mL Mouth Rinse BID  . mometasone-formoterol  2 puff Inhalation BID  . multivitamin with minerals  1 tablet Oral Daily  . pantoprazole  40 mg Oral Daily  . sodium chloride flush  10-40 mL Intracatheter Q12H  . sodium chloride flush  3 mL Intravenous Once  . sodium chloride flush  3 mL Intravenous Q12H  . sodium chloride flush  3 mL Intravenous Q12H  . spironolactone  12.5 mg Oral QHS  . tamsulosin  0.8 mg Oral QPC supper    Infusions: . sodium chloride 5 mL/hr at 01/18/19 0900  . 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    Admitted with marked volume overload and cardiogenic shock .   Assessment/Plan   1. A/C Systolic Heart Failure -> cardiogenic shock: Due to primarily to nonischemic cardiomyopathy (out of proportion to CAD). [place] 2015 with moderate disease. Has Biotronik BiV ICD.  Initial CO-OX 38% so milrinone started on 1/28. ECHO 01/01/19 EF 10% with severe MR/TR, biatrial enlargement, RV with moderately reduced function.  - Continue mirlinone 0.25 mcg/kg/min. CO-OX 69%.  - CVP trending up (13) despite IV lasix 40 daily. Will increase to 80 IV bid - Discussed at VAD MRB meeting on 2/10 and would be possible candidate once RP bleed improves. Likely will need to go home for several weeks to recover and then plan VAD. Not candidate  for CIR  2. RP bleed with symptomatic anemia - spontaneous while on  heparin (? Traumatic component). Was on heparin for h/o LV clot (no longer present) - has some tenderness in left groin due to extension of hematoma seen on CT - Received 2UPRBCs on 2/10  - Hgb stable at 9.0 today.  - Continue SCDs in bed. Back on lovenox 40 daily for DVT prophylaxis  3. CKD Stage III, creatinine baseline 1.8-2.4.   - Creatinine stable at 1.3  4. COPD on chronic home oxygen: He will need PFTs to assess degree of lung damage (ordered). No change.   5. L Kidney Cancer: Had cryoablation in 2015. No change.   6. PVCs: Started on amiodarone 2019 to suppress PVCs.  - Continue amiodarone 200 mg daily. No change.   7. 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 No change.   8. UTI, Klebsiella pneumoniae  - Completed rocephin course. No change.   9. Pulmonary nodule: 6 mm nodule in LUL noted on CT scan. Recommended 6 month repeat non contrast CT. Dr Haroldine Laws discussed with Dr Prescott Gum and okay to move forward with VAD workup. No change.   10. H/O [place] Thrombus Off coumadin due to RP. Hold off on lovenox with left groin tenderness. Maybe able to start tomorrow.    Continue diuresis. Consult CR. Will likely need to go home on milrinone for a few weeks prior to VAD placement. Will d/w family on Monday  Length of Stay: 20  Glori Bickers, MD  10:22 AM  Advanced Heart Failure Team Pager [phone] (M-F; 7a - 4p)  Please contact [facility] for night-coverage after hours (4p -7a ) and weekends on [email or web address]

## 2019-01-18 NOTE — Progress Notes (Signed)
CARDIAC REHAB PHASE I   Patient does not want to ambulate at this time because he is napping. Encouraged to walk with nurse later this evening.Spencer Beam Jaiyla Granados, MS 01/18/2019 1:42 PM

## 2019-01-19 LAB — BASIC METABOLIC PANEL
Anion gap: 11 (ref 5–15)
BUN: 27 mg/dL — ABNORMAL HIGH (ref 8–23)
CO2: 26 mmol/L (ref 22–32)
Calcium: 8.2 mg/dL — ABNORMAL LOW (ref 8.9–10.3)
Chloride: 93 mmol/L — ABNORMAL LOW (ref 98–111)
Creatinine, Ser: 1.2 mg/dL (ref 0.61–1.24)
GFR calc Af Amer: >60 mL/min (ref >60)
GFR, EST NON AFRICAN AMERICAN: 58 mL/min — AB (ref >60)
Glucose, Bld: 123 mg/dL — ABNORMAL HIGH (ref 70–99)
Potassium: 4 mmol/L (ref 3.5–5.1)
Sodium: 130 mmol/L — ABNORMAL LOW (ref 135–145)

## 2019-01-19 LAB — CBC
HCT: 25.6 % — ABNORMAL LOW (ref 39.0–52.0)
Hemoglobin: 8.5 g/dL — ABNORMAL LOW (ref 13.0–17.0)
MCH: 27.3 pg (ref 26.0–34.0)
MCHC: 33.2 g/dL (ref 30.0–36.0)
MCV: 82.3 fL (ref 80.0–100.0)
NRBC: 0.6 % — AB (ref 0.0–0.2)
Platelets: 208 10*3/uL (ref 150–400)
RBC: 3.11 MIL/uL — ABNORMAL LOW (ref 4.22–5.81)
RDW: 19.6 % — ABNORMAL HIGH (ref 11.5–15.5)
WBC: 9.9 10*3/uL (ref 4.0–10.5)

## 2019-01-19 LAB — PROTIME-INR
INR: 1.34
Prothrombin Time: 16.5 seconds — ABNORMAL HIGH (ref 11.4–15.2)

## 2019-01-19 LAB — MAGNESIUM: Magnesium: 2 mg/dL (ref 1.7–2.4)

## 2019-01-19 LAB — COOXEMETRY PANEL
Carboxyhemoglobin: 2.5 % — ABNORMAL HIGH (ref 0.5–1.5)
Methemoglobin: 1.7 % — ABNORMAL HIGH (ref 0.0–1.5)
O2 Saturation: 75.5 %
Total hemoglobin: 7.1 g/dL — ABNORMAL LOW (ref 12.0–16.0)

## 2019-01-19 MED ORDER — METOLAZONE 2.5 MG PO TABS
2.5000 mg | ORAL_TABLET | Freq: Once | ORAL | Status: AC
  Administered 2019-01-19: 2.5 mg via ORAL
  Filled 2019-01-19: qty 1

## 2019-01-19 MED ORDER — MILRINONE LACTATE IN DEXTROSE 20-5 MG/100ML-% IV SOLN
0.2500 ug/kg/min | INTRAVENOUS | Status: DC
  Administered 2019-01-19 – 2019-01-29 (×13): 0.25 ug/kg/min via INTRAVENOUS
  Filled 2019-01-19 (×12): qty 100

## 2019-01-19 NOTE — Plan of Care (Signed)
  Problem: Education: Goal: Ability to demonstrate management of disease process will improve Outcome: Progressing Goal: Ability to verbalize understanding of medication therapies will improve Outcome: Progressing Goal: Individualized Educational Video(s) Outcome: Progressing   Problem: Activity: Goal: Capacity to carry out activities will improve Outcome: Progressing   Problem: Cardiac: Goal: Ability to achieve and maintain adequate cardiopulmonary perfusion will improve Outcome: Progressing   Problem: Health Behavior/Discharge Planning: Goal: Ability to manage health-related needs will improve Outcome: Progressing   Problem: Clinical Measurements: Goal: Ability to maintain clinical measurements within normal limits will improve Outcome: Progressing Goal: Will remain free from infection Outcome: Progressing Goal: Diagnostic test results will improve Outcome: Progressing Goal: Respiratory complications will improve Outcome: Progressing Goal: Cardiovascular complication will be avoided Outcome: Progressing   Problem: Activity: Goal: Risk for activity intolerance will decrease Outcome: Progressing   Problem: Education: Goal: Ability to demonstrate management of disease process will improve Outcome: Progressing Goal: Ability to verbalize understanding of medication therapies will improve Outcome: Progressing Goal: Individualized Educational Video(s) Outcome: Progressing   Problem: Activity: Goal: Capacity to carry out activities will improve Outcome: Progressing   Problem: Cardiac: Goal: Ability to achieve and maintain adequate cardiopulmonary perfusion will improve Outcome: Progressing

## 2019-01-19 NOTE — Progress Notes (Signed)
Patient ID: Spencer Rice, male   DOB: 1941/06/21, 78 y.o.   MRN: [id number]     Advanced Heart Failure Rounding Note  PCP-Cardiologist: No primary care provider on file.   Subjective:    Developed large spontaneous RP bleed on heparin 2/10. 2/11 received 2UPRBCs. Hgb 9 => 8.5.   Remains on milrinone 0.25 mcg. CO-OX 76%.   Still with groin soreness.  Has not walked this weekend.  Breathing stable at rest.  CVP 14 today.  He was started on IV Lasix yesterday but has not diuresed much, creatinine stable.    [facility] 01/07/19  Ost RCA lesion is 50% stenosed.  Mid RCA lesion is 40% stenosed.  Prox LAD lesion is 50% stenosed.  Ost 2nd Diag to 2nd Diag lesion is 100% stenosed. Findings: Done on milrinone 0.25 mcg/kg/min RA = 7  RV = 62/8 PA = 65/26 (41) PCW = 19 Fick cardiac output/index = 5.6/2.7 PVR = 3.9 WU FA sat = 95% PA sat = 65%, 66% PaPI = 5.6 Assessment: 1. Mild non-obstructive CAD 2. Moderate PAH with normal PAPI 3. Normal CO on milrinone  ECHO 01/01/2019  EF 10% with severe MR, severe TR, Biatrial enlargement, RV moderately reduced function.   Objective:   Weight Range: 82.1 kg Body mass index is 23.88 kg/m.   Vital Signs:   Temp:  [98 F (36.7 C)-98.6 F (37 C)] 98.6 F (37 C) (02/16 0832) Pulse Rate:  [92-98] 92 (02/16 0832) Resp:  [12-23] 23 (02/16 0832) BP: (97-128)/(62-75) 108/69 (02/16 0832) SpO2:  [95 %-100 %] 100 % (02/16 0832) FiO2 (%):  [2 %] 2 % (02/15 1146) Weight:  [82.1 kg] 82.1 kg (02/16 0300) Last BM Date: 01/18/19  Weight change: Filed Weights   01/17/19 0616 01/18/19 0629 01/19/19 0300  Weight: 81.1 kg 82 kg 82.1 kg    Intake/Output:   Intake/Output Summary (Last 24 hours) at 01/19/2019 1139 Last data filed at 01/19/2019 0900 Gross per 24 hour  Intake 828.89 ml  Output 675 ml  Net 153.89 ml      Physical Exam   General: NAD Neck: JVP 12 cm, no thyromegaly or thyroid nodule.  Lungs: Clear to auscultation bilaterally  with normal respiratory effort. CV: Lateral PMI.  Heart regular S1/S2, no S3/S4, no murmur.  1+ edema 3/4 to knees bilaterally.   Abdomen: Soft, nontender, no hepatosplenomegaly, no distention.  Skin: Intact without lesions or rashes.  Neurologic: Alert and oriented x 3.  Psych: Normal affect. Extremities: No clubbing or cyanosis.  HEENT: Normal.    Telemetry    NSR with BiV pacing in 90s (personally reviewed)   Labs    CBC Recent Labs    01/18/19 0235 01/19/19 0217  WBC 10.2 9.9  HGB 9.0* 8.5*  HCT 27.8* 25.6*  MCV 83.2 82.3  PLT 174 025   Basic Metabolic Panel Recent Labs    01/18/19 0235 01/19/19 0217  NA 131* 130*  K 4.3 4.0  CL 91* 93*  CO2 27 26  GLUCOSE 136* 123*  BUN 29* 27*  CREATININE 1.32* 1.20  CALCIUM 8.7* 8.2*  MG 2.2 2.0   Liver Function Tests No results for input(s): AST, ALT, ALKPHOS, BILITOT, PROT, ALBUMIN in the last 72 hours. No results for input(s): LIPASE, AMYLASE in the last 72 hours. Cardiac Enzymes No results for input(s): CKTOTAL, CKMB, CKMBINDEX, TROPONINI in the last 72 hours.  BNP: BNP (last 3 results) Recent Labs    12/04/18 1743 12/28/18 1821 12/29/18 1943  BNP >  4,500.0* >4,925.0* >4,500.0*    ProBNP (last 3 results) No results for input(s): PROBNP in the last 8760 hours.   D-Dimer No results for input(s): DDIMER in the last 72 hours. Hemoglobin A1C No results for input(s): HGBA1C in the last 72 hours. Fasting Lipid Panel No results for input(s): CHOL, HDL, LDLCALC, TRIG, CHOLHDL, LDLDIRECT in the last 72 hours. Thyroid Function Tests No results for input(s): TSH, T4TOTAL, T3FREE, THYROIDAB in the last 72 hours.  Invalid input(s): FREET3  Other results:   Imaging    No results found.   Medications:     Scheduled Medications: . ALPRAZolam  0.25 mg Oral Once  . amiodarone  200 mg Oral Daily  . Chlorhexidine Gluconate Cloth  6 each Topical Daily  . feeding supplement  1 Container Oral BID BM  .  furosemide  80 mg Intravenous BID  . lactobacillus  1 g Oral TID WC  . mouth rinse  15 mL Mouth Rinse BID  . metolazone  2.5 mg Oral Once  . mometasone-formoterol  2 puff Inhalation BID  . multivitamin with minerals  1 tablet Oral Daily  . pantoprazole  40 mg Oral Daily  . sodium chloride flush  10-40 mL Intracatheter Q12H  . sodium chloride flush  3 mL Intravenous Once  . sodium chloride flush  3 mL Intravenous Q12H  . sodium chloride flush  3 mL Intravenous Q12H  . spironolactone  12.5 mg Oral QHS  . tamsulosin  0.8 mg Oral QPC supper    Infusions: . sodium chloride 5 mL/hr at 01/18/19 1246  . 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    Admitted with marked volume overload and cardiogenic shock .   Assessment/Plan   1. A/C Systolic Heart Failure -> cardiogenic shock: Due to primarily to nonischemic cardiomyopathy (out of proportion to CAD). [place] 2015 with moderate disease. Has Biotronik BiV ICD.  Initial CO-OX 38% so milrinone started on 1/28. ECHO 01/01/19 EF 10% with severe MR/TR, biatrial enlargement, RV with moderately reduced function.  - Continue mirlinone 0.25 mcg/kg/min while diuresing. CO-OX 76% today.  - CVP up to 14 without much response to IV Lasix yesterday, creatinine stable.  Will give metolazone 2.5 x 1 with next dose of Lasix.  - Discussed at [place] on 2/10 and would be possible candidate once RP bleed improves. Likely will need to go home for several weeks to recover then reassess. Not candidate for CIR  2. RP bleed  with symptomatic anemia - spontaneous while on heparin (? Traumatic component). Was on heparin for h/o LV clot (no longer present) - has some tenderness in left groin due to extension of hematoma seen on CT - Received 2U PRBCs on 2/10  - Hgb mildly lower at 8.5 today.   - Continue SCDs in bed. Back on lovenox 40 daily for DVT prophylaxis  3. CKD Stage III, creatinine baseline 1.8-2.4.   - Creatinine improved on milrinone, 1.2 today.   4. COPD on chronic home oxygen: PFTs 2/20 with restriction and obstruction.   5. L Kidney Cancer: Had cryoablation in 2015.  No change.   6. PVCs: Started on amiodarone 2019 to suppress PVCs.  - Continue amiodarone 200 mg daily. No change.   7. 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 No change.   8. UTI, Klebsiella pneumoniae  - Completed rocephin course. No change.   9. Pulmonary nodule: 6 mm nodule in LUL noted on CT scan. Recommended 6 month repeat non contrast CT. Dr Haroldine Laws discussed with Dr Prescott Gum and okay to move forward with VAD workup. No change.   10. H/O Mural Thrombus Off coumadin due to RP hematoma. Now on prophylactic Lovenox.    Possibly home on milrinone soon when better-diuresed.   Length of Stay: 21  [name], MD  11:39 AM  Advanced Heart Failure Team Pager [phone] (M-F; 7a - 4p)  Please contact [facility] for night-coverage after hours (4p -7a ) and weekends on [email or web address]

## 2019-01-20 LAB — COOXEMETRY PANEL
CARBOXYHEMOGLOBIN: 2 % — AB (ref 0.5–1.5)
Carboxyhemoglobin: 2.7 % — ABNORMAL HIGH (ref 0.5–1.5)
Methemoglobin: 1.5 % (ref 0.0–1.5)
Methemoglobin: 1.6 % — ABNORMAL HIGH (ref 0.0–1.5)
O2 Saturation: 94.1 %
O2 Saturation: 47.3 %
TOTAL HEMOGLOBIN: 10.2 g/dL — AB (ref 12.0–16.0)
Total hemoglobin: 9.2 g/dL — ABNORMAL LOW (ref 12.0–16.0)

## 2019-01-20 LAB — PROTIME-INR
INR: 1.31
Prothrombin Time: 16.2 seconds — ABNORMAL HIGH (ref 11.4–15.2)

## 2019-01-20 LAB — BASIC METABOLIC PANEL
Anion gap: 11 (ref 5–15)
BUN: 28 mg/dL — ABNORMAL HIGH (ref 8–23)
CO2: 27 mmol/L (ref 22–32)
Calcium: 8.1 mg/dL — ABNORMAL LOW (ref 8.9–10.3)
Chloride: 93 mmol/L — ABNORMAL LOW (ref 98–111)
Creatinine, Ser: 1.21 mg/dL (ref 0.61–1.24)
GFR calc Af Amer: >60 mL/min (ref >60)
GFR, EST NON AFRICAN AMERICAN: 57 mL/min — AB (ref >60)
Glucose, Bld: 140 mg/dL — ABNORMAL HIGH (ref 70–99)
Potassium: 3.7 mmol/L (ref 3.5–5.1)
Sodium: 131 mmol/L — ABNORMAL LOW (ref 135–145)

## 2019-01-20 LAB — CBC
HCT: 27.7 % — ABNORMAL LOW (ref 39.0–52.0)
Hemoglobin: 9 g/dL — ABNORMAL LOW (ref 13.0–17.0)
MCH: 27 pg (ref 26.0–34.0)
MCHC: 32.5 g/dL (ref 30.0–36.0)
MCV: 83.2 fL (ref 80.0–100.0)
NRBC: 0.5 % — AB (ref 0.0–0.2)
Platelets: 224 10*3/uL (ref 150–400)
RBC: 3.33 MIL/uL — ABNORMAL LOW (ref 4.22–5.81)
RDW: 20 % — ABNORMAL HIGH (ref 11.5–15.5)
WBC: 8.2 10*3/uL (ref 4.0–10.5)

## 2019-01-20 LAB — MAGNESIUM: MAGNESIUM: 2.1 mg/dL (ref 1.7–2.4)

## 2019-01-20 MED ORDER — BENZONATATE 100 MG PO CAPS
100.0000 mg | ORAL_CAPSULE | Freq: Three times a day (TID) | ORAL | Status: DC | PRN
  Administered 2019-01-21: 100 mg via ORAL
  Filled 2019-01-20: qty 1

## 2019-01-20 MED ORDER — ENOXAPARIN SODIUM 40 MG/0.4ML ~~LOC~~ SOLN
40.0000 mg | SUBCUTANEOUS | Status: DC
  Administered 2019-01-20 – 2019-01-29 (×10): 40 mg via SUBCUTANEOUS
  Filled 2019-01-20 (×9): qty 0.4

## 2019-01-20 MED ORDER — TORSEMIDE 20 MG PO TABS
40.0000 mg | ORAL_TABLET | Freq: Two times a day (BID) | ORAL | Status: DC
  Administered 2019-01-20 (×2): 40 mg via ORAL
  Filled 2019-01-20 (×3): qty 2

## 2019-01-20 MED ORDER — ALTEPLASE 2 MG IJ SOLR
2.0000 mg | Freq: Once | INTRAMUSCULAR | Status: AC
  Administered 2019-01-20: 2 mg

## 2019-01-20 MED ORDER — POTASSIUM CHLORIDE CRYS ER 20 MEQ PO TBCR
40.0000 meq | EXTENDED_RELEASE_TABLET | Freq: Once | ORAL | Status: AC
  Administered 2019-01-20: 40 meq via ORAL
  Filled 2019-01-20: qty 2

## 2019-01-20 NOTE — Plan of Care (Signed)
  Problem: Education: Goal: Ability to demonstrate management of disease process will improve Outcome: Progressing Goal: Ability to verbalize understanding of medication therapies will improve Outcome: Progressing Goal: Individualized Educational Video(s) Outcome: Progressing   Problem: Activity: Goal: Capacity to carry out activities will improve Outcome: Progressing   Problem: Cardiac: Goal: Ability to achieve and maintain adequate cardiopulmonary perfusion will improve Outcome: Progressing   Problem: Health Behavior/Discharge Planning: Goal: Ability to manage health-related needs will improve Outcome: Progressing   Problem: Clinical Measurements: Goal: Ability to maintain clinical measurements within normal limits will improve Outcome: Progressing Goal: Will remain free from infection Outcome: Progressing Goal: Diagnostic test results will improve Outcome: Progressing Goal: Respiratory complications will improve Outcome: Progressing Goal: Cardiovascular complication will be avoided Outcome: Progressing   Problem: Activity: Goal: Risk for activity intolerance will decrease Outcome: Progressing   Problem: Education: Goal: Ability to demonstrate management of disease process will improve Outcome: Progressing Goal: Ability to verbalize understanding of medication therapies will improve Outcome: Progressing Goal: Individualized Educational Video(s) Outcome: Progressing   Problem: Activity: Goal: Capacity to carry out activities will improve Outcome: Progressing   Problem: Cardiac: Goal: Ability to achieve and maintain adequate cardiopulmonary perfusion will improve Outcome: Progressing

## 2019-01-20 NOTE — Plan of Care (Signed)
  Problem: Education: Goal: Individualized Educational Video(s) Outcome: Progressing   Problem: Activity: Goal: Capacity to carry out activities will improve Outcome: Progressing   Problem: Cardiac: Goal: Ability to achieve and maintain adequate cardiopulmonary perfusion will improve Outcome: Progressing   Problem: Health Behavior/Discharge Planning: Goal: Ability to manage health-related needs will improve Outcome: Progressing   Problem: Clinical Measurements: Goal: Ability to maintain clinical measurements within normal limits will improve Outcome: Progressing Goal: Will remain free from infection Outcome: Progressing Goal: Diagnostic test results will improve Outcome: Progressing Goal: Respiratory complications will improve Outcome: Progressing Goal: Cardiovascular complication will be avoided Outcome: Progressing   Problem: Activity: Goal: Risk for activity intolerance will decrease Outcome: Progressing   Problem: Education: Goal: Ability to demonstrate management of disease process will improve Outcome: Progressing Goal: Ability to verbalize understanding of medication therapies will improve Outcome: Progressing   Problem: Activity: Goal: Capacity to carry out activities will improve Outcome: Progressing   Problem: Cardiac: Goal: Ability to achieve and maintain adequate cardiopulmonary perfusion will improve Outcome: Progressing

## 2019-01-20 NOTE — Progress Notes (Signed)
Nutrition Follow-up  DOCUMENTATION CODES:   Non-severe (moderate) malnutrition in context of chronic illness  INTERVENTION:   - Continue Boost Breeze po BID, each supplement provides 250 kcal and 9 grams of protein  - Continue MVI with minerals daily  - Will provide double protein portions with all meals  - Continue bedtime snack  NUTRITION DIAGNOSIS:   Moderate Malnutrition related to chronic illness (CHF, COPD, CAD) as evidenced by moderate muscle depletion, moderate fat depletion.  Ongoing  GOAL:   Patient will meet greater than or equal to 90% of their needs  Progressing  MONITOR:   PO intake, Supplement acceptance, Skin  REASON FOR ASSESSMENT:   Consult LVAD Eval  ASSESSMENT:   78 yo male with PMH of CAD, COPD, DM2, CHF, AICD, HTN, GERD who was admitted with SOB r/t CHF exacerbation.  2/10 - transferred to ICU for large retroperitoneal bleed  Overall, weight down 24 lbs since admission. Pt is -25.4 L since admission.  Attempted to speak with pt at bedside. Pt sleeping soundly and did not awaken to RD voice. Discussed pt with RN who reports pt is requesting additional food. RD to order double protein portions with all meals.  Per MAR, pt accepting Boost Breeze oral nutrition supplements ~50% of the time and taking daily MVI.  Meal Completion: 20-100%  Medications reviewed and include: Boost Breeze BID, lactobacillus, MVI with minerals daily, Protonix  Labs reviewed: sodium 131 (L), BUN 28 (H), hemoglobin 9.0  (L)  UOP: 1100 ml x 24 hours I/O's: -25.4 L since admit  Diet Order:   Diet Order            Diet Heart Room service appropriate? Yes; Fluid consistency: Thin  Diet effective now              EDUCATION NEEDS:   Education needs have been addressed  Skin:  Skin Assessment: Reviewed RN Assessment  Last BM:  2/16  Height:   Ht Readings from Last 1 Encounters:  01/17/19 6\' 1"  (1.854 m)    Weight:   Wt Readings from Last 1  Encounters:  01/20/19 82.5 kg    Ideal Body Weight:  85 kg  BMI:  Body mass index is 24 kg/m.  Estimated Nutritional Needs:   Kcal:  2100-2400  Protein:  115-130 gm  Fluid:  2 L    Gaynell Face, MS, RD, LDN Inpatient Clinical Dietitian Pager: [phone] Weekend/After Hours: [phone]

## 2019-01-20 NOTE — Progress Notes (Addendum)
Patient ID: Spencer Rice, male   DOB: [birth year], 78 y.o.   MRN: [id number]     Advanced Heart Failure Rounding Note  PCP-Cardiologist: No primary care provider on file.   Subjective:    Developed large spontaneous RP bleed on heparin 2/10. 2/11 received 2UPRBCs. Hgb 9 => 8.5.   Coox 94% this am (Falsely high) on milrinone 0.25 mcg.   Had pleuritic Right-sided chest pain over the weekend. Says it was worse with breathing in. None currently. CVP 6-7 on my check at bedside. Negative 400 cc on IV lasix 80 mg BID. Cr 1.21.  Denies SOB this am. Says noone walked him this am.    [facility] 01/07/19  Ost RCA lesion is 50% stenosed.  Mid RCA lesion is 40% stenosed.  Prox LAD lesion is 50% stenosed.  Ost 2nd Diag to 2nd Diag lesion is 100% stenosed. Findings: Done on milrinone 0.25 mcg/kg/min RA = 7  RV = 62/8 PA = 65/26 (41) PCW = 19 Fick cardiac output/index = 5.6/2.7 PVR = 3.9 WU FA sat = 95% PA sat = 65%, 66% PaPI = 5.6 Assessment: 1. Mild non-obstructive CAD 2. Moderate PAH with normal PAPI 3. Normal CO on milrinone  ECHO 01/01/2019  EF 10% with severe Spencer, severe TR, Biatrial enlargement, RV moderately reduced function.   Objective:   Weight Range: 82.5 kg Body mass index is 24 kg/m.   Vital Signs:   Temp:  [98.2 F (36.8 C)-98.7 F (37.1 C)] 98.2 F (36.8 C) (02/17 0706) Pulse Rate:  [86-94] 86 (02/17 0706) Resp:  [16-26] 22 (02/17 0706) BP: (94-104)/(62-86) 104/70 (02/17 0706) SpO2:  [95 %-100 %] 98 % (02/17 0757) Weight:  [82.5 kg] 82.5 kg (02/17 0300) Last BM Date: 01/19/19  Weight change: Filed Weights   01/18/19 0629 01/19/19 0300 01/20/19 0300  Weight: 82 kg 82.1 kg 82.5 kg    Intake/Output:   Intake/Output Summary (Last 24 hours) at 01/20/2019 0910 Last data filed at 01/19/2019 2300 Gross per 24 hour  Intake 443 ml  Output 1100 ml  Net -657 ml    Physical Exam   General: NAD HEENT: Normal Neck: Supple. JVP appears ~7 cm. Carotids 2+  bilat; no bruits. No thyromegaly or nodule noted. Cor: PMI nondisplaced. RRR, No M/G/R noted Lungs: CTAB, normal effort. Abdomen: Soft, non-tender, non-distended, no HSM. No bruits or masses. +BS  Extremities: No cyanosis, clubbing, or rash. 2+ soft ankle edema. Trace to 1+ edema 1/2 way to knee.  Neuro: Alert & orientedx3, cranial nerves grossly intact. moves all 4 extremities w/o difficulty. Affect pleasant   Telemetry   NSR with BiV pacing in 90s, personally reviewed.   Labs    CBC Recent Labs    01/19/19 0217 01/20/19 0619  WBC 9.9 8.2  HGB 8.5* 9.0*  HCT 25.6* 27.7*  MCV 82.3 83.2  PLT 208 409   Basic Metabolic Panel Recent Labs    01/19/19 0217 01/20/19 0619  NA 130* 131*  K 4.0 3.7  CL 93* 93*  CO2 26 27  GLUCOSE 123* 140*  BUN 27* 28*  CREATININE 1.20 1.21  CALCIUM 8.2* 8.1*  MG 2.0 2.1   Liver Function Tests No results for input(s): AST, ALT, ALKPHOS, BILITOT, PROT, ALBUMIN in the last 72 hours. No results for input(s): LIPASE, AMYLASE in the last 72 hours. Cardiac Enzymes No results for input(s): CKTOTAL, CKMB, CKMBINDEX, TROPONINI in the last 72 hours.  BNP: BNP (last 3 results) Recent Labs    12/04/18 1743  12/28/18 1821 12/29/18 1943  BNP >4,500.0* >4,925.0* >4,500.0*    ProBNP (last 3 results) No results for input(s): PROBNP in the last 8760 hours.   D-Dimer No results for input(s): DDIMER in the last 72 hours. Hemoglobin A1C No results for input(s): HGBA1C in the last 72 hours. Fasting Lipid Panel No results for input(s): CHOL, HDL, LDLCALC, TRIG, CHOLHDL, LDLDIRECT in the last 72 hours. Thyroid Function Tests No results for input(s): TSH, T4TOTAL, T3FREE, THYROIDAB in the last 72 hours.  Invalid input(s): FREET3  Other results:   Imaging    No results found.   Medications:     Scheduled Medications: . ALPRAZolam  0.25 mg Oral Once  . amiodarone  200 mg Oral Daily  . Chlorhexidine Gluconate Cloth  6 each Topical Daily   . feeding supplement  1 Container Oral BID BM  . furosemide  80 mg Intravenous BID  . lactobacillus  1 g Oral TID WC  . mouth rinse  15 mL Mouth Rinse BID  . mometasone-formoterol  2 puff Inhalation BID  . multivitamin with minerals  1 tablet Oral Daily  . pantoprazole  40 mg Oral Daily  . sodium chloride flush  10-40 mL Intracatheter Q12H  . sodium chloride flush  3 mL Intravenous Once  . sodium chloride flush  3 mL Intravenous Q12H  . sodium chloride flush  3 mL Intravenous Q12H  . spironolactone  12.5 mg Oral QHS  . tamsulosin  0.8 mg Oral QPC supper    Infusions: . sodium chloride 5 mL/hr at 01/18/19 1246  . 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    Admitted with marked volume overload and cardiogenic shock .   Assessment/Plan   1. A/C Systolic Heart Failure -> cardiogenic shock: Due to primarily to nonischemic cardiomyopathy (out of proportion to CAD). [place] 2015 with moderate disease. Has Biotronik BiV ICD.  Initial CO-OX 38% so milrinone started on 1/28. ECHO 01/01/19 EF 10% with severe Spencer/TR, biatrial enlargement, RV with moderately reduced function.  - Continue mirlinone 0.25 mcg/kg/min while diuresing. CO-OX 94%. Falsely high. Resent.  - CVP ~ 7 cm on my check. Stop IV lasix. Transition back to torsemide 40 mg BID and follow.  - Discussed at [place] on 2/10 and would be possible candidate once RP bleed improves. Likely will need to go home for several weeks to recover then reassess. Not candidate  for CIR - Replaced UNNA boots.  2. RP bleed with symptomatic anemia - Spontaneous while on heparin (? Traumatic component). Was on heparin for h/o LV clot (no longer present) - Has some tenderness in left groin due to extension of hematoma seen on CT - Received 2U PRBCs on 2/10  - Hgb 9.0 today.  - Continue SCDs in bed. Back on lovenox 40 daily for DVT prophylaxis  3. CKD Stage III, creatinine baseline 1.8-2.4.   - Creatinine 1.21 today.   4. COPD on chronic home oxygen: PFTs 01/10/19 with restriction and obstruction. DLCO 53% (14.64)  5. L Kidney Cancer: Had cryoablation in 2015. Stable.   6. PVCs:  Started on amiodarone 2019 to suppress PVCs.  - Continue amiodarone 200 mg daily. No change.   7. Mitral Regurgitation/Tricuspid Regurgitation: Severe Spencer/TR on ECHO 01/01/19.  - No change to current plan.    8. UTI, Klebsiella pneumoniae  - Completed rocephin course. No change.   9. Pulmonary nodule: 6 mm nodule in LUL noted on CT scan.  - Recommended 6 month repeat non contrast CT. Dr Haroldine Laws discussed with Dr Prescott Gum and okay to move forward with VAD workup. No change.   10. H/O Mural Thrombus - Off coumadin due to RP hematoma. Now on prophylactic Lovenox.  - No change.    11. Deconditioning - Turned down for CIR.  - Cardiac rehab to see.   Transitioning to po diuretic this am. [facility] aware he will be going home with milrinone. PICC line with no draw back, so will need to decide if TPA vs move to tunneled for home. May be reasonable to leave peripheral if working towards LVAD.   Length of Stay: [place], [place]  9:10 AM  Advanced Heart Failure Team Pager [phone] (M-F; 7a - 4p)  Please contact [facility] for night-coverage after hours (4p -7a ) and weekends on [email or web address]  Patient seen and examined with the above-signed Advanced Practice Provider and/or Housestaff. I personally reviewed laboratory data, imaging studies and relevant notes. I independently  examined the patient and formulated the important aspects of the plan. I have edited the note to reflect any of my changes or salient points. I have personally discussed the plan with the patient and/or family.  Remains very weak. CVP improved despite edema. Will stop IV lasix. Switch to po. Continue milrinone. I am not sure he is stable enough for d/c home. Needs several weeks before VAD. Will discuss again with PT regarding recommendations for HHPT vs CIR. Will discuss timing of VAD at [facility] today. Agree with t-PA for PICC  Glori Bickers, MD  1:38 PM

## 2019-01-20 NOTE — Progress Notes (Signed)
CARDIAC REHAB PHASE I   Offered to walk with pt. Pt declining at this time stating he "just got back" from walking. Encouraged pt to walk again later today, along with IS use. Will continue to follow and encourage mobility.  Rufina Falco, RN BSN 01/20/2019 2:02 PM

## 2019-01-20 NOTE — Progress Notes (Signed)
IP rehab admissions - I met with patient and explained that he is doing too well to meet criteria for acute inpatient rehab admission.  Recommendations are for home with [facility] therapies.  Call me for questions.  [phone]

## 2019-01-20 NOTE — Progress Notes (Signed)
Physical Therapy Treatment Patient Details Name: Spencer Rice MRN: [id number] DOB: 1941/07/28 Today's Date: 01/20/2019    History of Present Illness 78yo male with ongoing SOB, chest x-ray showing pulmonary edema and R pleural effusion. Admitted for CHF exacerbation. Pt developed retroperitoneal bleed. PMH kidney cancer, CHF, hx DVT, AICD/PPM plancement, COPD, apical thrombus on Coumadin, hx cardiac cath     PT Comments    Patient seen for activity progression. Tolerated in hall ambulation minimal distance. Ambulated on room air with saturations stable >94%, RR mid 20s and HR remained mid 90s throughout ambulation. Overall distance limited to 62ft due to patient's request to return to room. Patient denies SOB/dizziness/pain, encouraged further mobility but instead returned to room and repositioned in recliner.  Current POC remains appropriate at this time.  Follow Up Recommendations  Home health PT     Equipment Recommendations  Rolling walker with 5" wheels    Recommendations for Other Services       Precautions / Restrictions Precautions Precautions: Fall Restrictions Weight Bearing Restrictions: No    Mobility  Bed Mobility               General bed mobility comments: Up in chair  Transfers Overall transfer level: Needs assistance Equipment used: Rolling walker (2 wheeled) Transfers: Sit to/from Stand Sit to Stand: Min guard         General transfer comment: Assist for safety and lines  Ambulation/Gait Ambulation/Gait assistance: Supervision;Min guard Gait Distance (Feet): 60 Feet Assistive device: Rolling walker (2 wheeled) Gait Pattern/deviations: Step-through pattern;Decreased stride length;Decreased step length - right;Decreased step length - left Gait velocity: decreased  Gait velocity interpretation: <1.8 ft/sec, indicate of risk for recurrent falls General Gait Details: Min guard for safety, patient ambulated on room air with saturations stable  >94% thorughout, with no evidence of DOE however, patient self limiting. Requesting to return to room despite encouragement.    Stairs             Wheelchair Mobility    Modified Rankin (Stroke Patients Only)       Balance Overall balance assessment: Mild deficits observed, not formally tested                                          Cognition Arousal/Alertness: Awake/alert Behavior During Therapy: Flat affect Overall Cognitive Status: Within Functional Limits for tasks assessed                                        Exercises      General Comments        Pertinent Vitals/Pain      Home Living                      Prior Function            PT Goals (current goals can now be found in the care plan section) Acute Rehab PT Goals Patient Stated Goal: go home PT Goal Formulation: With patient Time For Goal Achievement: 01/17/19 Potential to Achieve Goals: Good Progress towards PT goals: Not progressing toward goals - comment(tolerated minimal amb but requesting to return to room )    Frequency    Min 3X/week      PT Plan Current plan remains appropriate(will update if pt  has LVAD)    Co-evaluation              AM-PAC PT "6 Clicks" Mobility   Outcome Measure  Help needed turning from your back to your side while in a flat bed without using bedrails?: A Little Help needed moving from lying on your back to sitting on the side of a flat bed without using bedrails?: A Little Help needed moving to and from a bed to a chair (including a wheelchair)?: A Little Help needed standing up from a chair using your arms (e.g., wheelchair or bedside chair)?: A Little Help needed to walk in hospital room?: A Little Help needed climbing 3-5 steps with a railing? : A Little 6 Click Score: 18    End of Session Equipment Utilized During Treatment: Oxygen Activity Tolerance: Patient limited by fatigue Patient left: in  chair;with call bell/phone within reach Nurse Communication: Mobility status PT Visit Diagnosis: Muscle weakness (generalized) (M62.81);Other abnormalities of gait and mobility (R26.89)     Time: 6244-6950 PT Time Calculation (min) (ACUTE ONLY): 16 min  Charges:  $Gait Training: 8-22 mins                     Alben Deeds, PT DPT  Board Certified Neurologic Specialist [place] Pager [phone] Office [place] 01/20/2019, 12:41 PM

## 2019-01-20 NOTE — Progress Notes (Signed)
Orthopedic Tech Progress Note Patient Details:  Spencer Rice 07/14/1941 [id number]  Ortho Devices Type of Ortho Device: Louretta Parma boot Ortho Device/Splint Interventions: Ordered, Application, Adjustment   Post Interventions Patient Tolerated: Well Instructions Provided: Adjustment of device, Care of device   Spencer Rice 01/20/2019, 2:35 PM

## 2019-01-21 LAB — CBC
HCT: 28 % — ABNORMAL LOW (ref 39.0–52.0)
Hemoglobin: 9.1 g/dL — ABNORMAL LOW (ref 13.0–17.0)
MCH: 27.3 pg (ref 26.0–34.0)
MCHC: 32.5 g/dL (ref 30.0–36.0)
MCV: 84.1 fL (ref 80.0–100.0)
Platelets: 257 10*3/uL (ref 150–400)
RBC: 3.33 MIL/uL — ABNORMAL LOW (ref 4.22–5.81)
RDW: 20.2 % — ABNORMAL HIGH (ref 11.5–15.5)
WBC: 8.9 10*3/uL (ref 4.0–10.5)
nRBC: 0.3 % — ABNORMAL HIGH (ref 0.0–0.2)

## 2019-01-21 LAB — BASIC METABOLIC PANEL
Anion gap: 11 (ref 5–15)
BUN: 33 mg/dL — ABNORMAL HIGH (ref 8–23)
CALCIUM: 8.2 mg/dL — AB (ref 8.9–10.3)
CO2: 30 mmol/L (ref 22–32)
Chloride: 88 mmol/L — ABNORMAL LOW (ref 98–111)
Creatinine, Ser: 1.33 mg/dL — ABNORMAL HIGH (ref 0.61–1.24)
GFR calc Af Amer: 59 mL/min — ABNORMAL LOW (ref >60)
GFR calc non Af Amer: 51 mL/min — ABNORMAL LOW (ref >60)
Glucose, Bld: 172 mg/dL — ABNORMAL HIGH (ref 70–99)
Potassium: 3.5 mmol/L (ref 3.5–5.1)
Sodium: 129 mmol/L — ABNORMAL LOW (ref 135–145)

## 2019-01-21 LAB — PROTIME-INR
INR: 1.34
Prothrombin Time: 16.4 seconds — ABNORMAL HIGH (ref 11.4–15.2)

## 2019-01-21 LAB — MAGNESIUM: Magnesium: 1.8 mg/dL (ref 1.7–2.4)

## 2019-01-21 LAB — COOXEMETRY PANEL
Carboxyhemoglobin: 2.1 % — ABNORMAL HIGH (ref 0.5–1.5)
Methemoglobin: 1.8 % — ABNORMAL HIGH (ref 0.0–1.5)
O2 SAT: 51.3 %
Total hemoglobin: 9 g/dL — ABNORMAL LOW (ref 12.0–16.0)

## 2019-01-21 MED ORDER — MAGNESIUM SULFATE 2 GM/50ML IV SOLN
2.0000 g | Freq: Once | INTRAVENOUS | Status: AC
  Administered 2019-01-21: 2 g via INTRAVENOUS
  Filled 2019-01-21: qty 50

## 2019-01-21 MED ORDER — POTASSIUM CHLORIDE CRYS ER 20 MEQ PO TBCR
40.0000 meq | EXTENDED_RELEASE_TABLET | Freq: Once | ORAL | Status: AC
  Administered 2019-01-21: 40 meq via ORAL
  Filled 2019-01-21: qty 2

## 2019-01-21 NOTE — Progress Notes (Addendum)
Patient ID: Spencer Rice, male   DOB: 1941-04-21, 79 y.o.   MRN: [id number]     Advanced Heart Failure Rounding Note  PCP-Cardiologist: No primary care provider on file.   Subjective:    Developed large spontaneous RP bleed on heparin 2/10. 2/11 received 2UPRBCs. Hgb 9 => 8.5.   Coox 51.3% this am on milrinone 0.25 mcg.   I/O relatively even, though weight shows up 3 lbs. Cr 1.21 -> 1.33. CVP 2-3.  Feeling OK this am. Worried about going home with PT. He would have to support his wife at home, but would not have great support for himself. No SOB this am at rest.   [facility] 01/07/19  Ost RCA lesion is 50% stenosed.  Mid RCA lesion is 40% stenosed.  Prox LAD lesion is 50% stenosed.  Ost 2nd Diag to 2nd Diag lesion is 100% stenosed. Findings: Done on milrinone 0.25 mcg/kg/min RA = 7  RV = 62/8 PA = 65/26 (41) PCW = 19 Fick cardiac output/index = 5.6/2.7 PVR = 3.9 WU FA sat = 95% PA sat = 65%, 66% PaPI = 5.6 Assessment: 1. Mild non-obstructive CAD 2. Moderate PAH with normal PAPI 3. Normal CO on milrinone  ECHO 01/01/2019  EF 10% with severe MR, severe TR, Biatrial enlargement, RV moderately reduced function.   Objective:   Weight Range: 83.5 kg Body mass index is 24.28 kg/m.   Vital Signs:   Temp:  [97.9 F (36.6 C)-98.4 F (36.9 C)] 98.3 F (36.8 C) (02/18 0403) Pulse Rate:  [80-91] 80 (02/18 0403) Resp:  [12-25] 12 (02/18 0403) BP: (99-111)/(62-94) 103/62 (02/18 0403) SpO2:  [97 %-100 %] 99 % (02/18 0841) Weight:  [83.5 kg] 83.5 kg (02/18 0500) Last BM Date: 01/19/19  Weight change: Filed Weights   01/19/19 0300 01/20/19 0300 01/21/19 0500  Weight: 82.1 kg 82.5 kg 83.5 kg    Intake/Output:   Intake/Output Summary (Last 24 hours) at 01/21/2019 0949 Last data filed at 01/21/2019 0600 Gross per 24 hour  Intake 1341.89 ml  Output 1400 ml  Net -58.11 ml    Physical Exam   General: NAD HEENT: Normal Neck: Supple. JVP not elevated. Carotids 2+  bilat; no bruits. No thyromegaly or nodule noted. Cor: PMI nondisplaced. RRR, No M/G/R noted Lungs: CTAB, normal effort. Abdomen: Soft, non-tender, non-distended, no HSM. No bruits or masses. +BS  Extremities: No cyanosis, clubbing, or rash. Trace ankle edema.  Neuro: Alert & orientedx3, cranial nerves grossly intact. moves all 4 extremities w/o difficulty. Affect pleasant   Telemetry   NSR with BIV 70-80s, personally reviewed.   Labs    CBC Recent Labs    01/20/19 0619 01/21/19 0405  WBC 8.2 8.9  HGB 9.0* 9.1*  HCT 27.7* 28.0*  MCV 83.2 84.1  PLT 224 496   Basic Metabolic Panel Recent Labs    01/20/19 0619 01/21/19 0405  NA 131* 129*  K 3.7 3.5  CL 93* 88*  CO2 27 30  GLUCOSE 140* 172*  BUN 28* 33*  CREATININE 1.21 1.33*  CALCIUM 8.1* 8.2*  MG 2.1 1.8   Liver Function Tests No results for input(s): AST, ALT, ALKPHOS, BILITOT, PROT, ALBUMIN in the last 72 hours. No results for input(s): LIPASE, AMYLASE in the last 72 hours. Cardiac Enzymes No results for input(s): CKTOTAL, CKMB, CKMBINDEX, TROPONINI in the last 72 hours.  BNP: BNP (last 3 results) Recent Labs    12/04/18 1743 12/28/18 1821 12/29/18 1943  BNP >4,500.0* >4,925.0* >4,500.0*  ProBNP (last 3 results) No results for input(s): PROBNP in the last 8760 hours.   D-Dimer No results for input(s): DDIMER in the last 72 hours. Hemoglobin A1C No results for input(s): HGBA1C in the last 72 hours. Fasting Lipid Panel No results for input(s): CHOL, HDL, LDLCALC, TRIG, CHOLHDL, LDLDIRECT in the last 72 hours. Thyroid Function Tests No results for input(s): TSH, T4TOTAL, T3FREE, THYROIDAB in the last 72 hours.  Invalid input(s): FREET3  Other results:   Imaging    No results found.   Medications:     Scheduled Medications: . ALPRAZolam  0.25 mg Oral Once  . amiodarone  200 mg Oral Daily  . Chlorhexidine Gluconate Cloth  6 each Topical Daily  . enoxaparin (LOVENOX) injection  40 mg  Subcutaneous Q24H  . feeding supplement  1 Container Oral BID BM  . lactobacillus  1 g Oral TID WC  . mouth rinse  15 mL Mouth Rinse BID  . mometasone-formoterol  2 puff Inhalation BID  . multivitamin with minerals  1 tablet Oral Daily  . pantoprazole  40 mg Oral Daily  . sodium chloride flush  10-40 mL Intracatheter Q12H  . sodium chloride flush  3 mL Intravenous Once  . sodium chloride flush  3 mL Intravenous Q12H  . sodium chloride flush  3 mL Intravenous Q12H  . spironolactone  12.5 mg Oral QHS  . tamsulosin  0.8 mg Oral QPC supper  . torsemide  40 mg Oral BID    Infusions: . sodium chloride 5 mL/hr at 01/18/19 1246  . 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    Admitted with marked volume overload and cardiogenic shock .   Assessment/Plan   1. A/C Systolic Heart Failure -> cardiogenic shock: Due to primarily to nonischemic cardiomyopathy (out of proportion to CAD). [place] 2015 with moderate disease. Has Biotronik BiV ICD.  Initial CO-OX 38% so milrinone started on 1/28. ECHO 01/01/19 EF 10% with severe MR/TR, biatrial enlargement, RV with moderately reduced function.  - Coox 51.3% on milrinone 0.25 mcg/kg/min while diuresing. Will follow for now.  - CVP 2-3 this am on check at bedside. Hold torsemide today.  - Discussed at [place] on 2/10 and would be possible candidate once RP bleed improves. Likely will need to go home for several weeks to recover then reassess. Not candidate for CIR -  Replaced UNNA boots.  2. RP bleed with symptomatic anemia - Spontaneous while on heparin (? Traumatic component). Was on heparin for h/o LV clot (no longer present) - Has some tenderness in left groin due to extension of hematoma seen on CT - Received 2U PRBCs on 2/10  - Hgb 9.1 today.   - Continue SCDs in bed. Back on lovenox 40 daily for DVT prophylaxis  3. CKD Stage III, Previously perceived creatinine baseline 1.8-2.4.   - Creatinine 1.33 today.    4. COPD on chronic home oxygen: PFTs 01/10/19 with restriction and obstruction. DLCO 53% (14.64) - No change to current plan.    5. L Kidney Cancer: Had cryoablation in 2015. Stable.  6. PVCs: Started on amiodarone 2019 to suppress PVCs.  - Continue amiodarone 200 mg daily. No change.   7. 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  - No change to current plan.    8. UTI, Klebsiella pneumoniae  - Completed rocephin course. No change.   9. Pulmonary nodule: 6 mm nodule in LUL noted on CT scan.  - Recommended 6 month repeat non contrast CT. Dr Haroldine Laws discussed with Dr Prescott Gum and okay to move forward with VAD workup. No change.   10. H/O Mural Thrombus - Off coumadin due to RP hematoma. Now on prophylactic Lovenox.  - No change.     11. Deconditioning - Turned down for CIR.  - Will ask PT to re-evaluate. He will not have adequate support at home for HHPT. He is willing to consider SNF if he qualifies.   Length of Stay: [place], [place]  9:49 AM  Advanced Heart Failure Team Pager [phone] (M-F; 7a - 4p)  Please contact [facility] for night-coverage after hours (4p -7a ) and weekends on [email or web address]  Patient seen and examined with the above-signed Advanced Practice Provider and/or Housestaff. I personally reviewed laboratory data, imaging studies and relevant notes. I independently examined the patient and formulated the important aspects of the plan. I have edited the  note to reflect any of my changes or salient points. I have personally discussed the plan with the patient and/or family.  Remains weak. On milrinone co-ox dropping. Now 51%. Limited mobility. Ab pain improving as RP bleed resolves. Case discussed at [facility] and not felt to be candidate for VAD now. Will need to go to rehab for several weeks and re-evaluate. I discussed situation with hids daughter-in-law need for better home support if he is going to be candidate for VAD.   Glori Bickers, MD  11:11 AM

## 2019-01-21 NOTE — Care Management Important Message (Signed)
Important Message  Patient Details  Name: Spencer Rice MRN: [id number] Date of Birth: 1941-01-16   Medicare Important Message Given:  Yes    Vincenzina Jagoda P [place] 01/21/2019, 11:35 AM

## 2019-01-21 NOTE — Progress Notes (Signed)
CARDIAC REHAB PHASE I   PRE:  Rate/Rhythm: 80 pacing with PVC    BP: sitting 105/65    SaO2: 100 2L  MODE:  Ambulation: 280 ft   POST:  Rate/Rhythm: 92 pacing     BP: sitting 119/60     SaO2: 100 2L  Pt eager to walk. Able to stand and walk with RW. Steady, even pace. Denied SOB or overly fatigue. Able to increase distance today. Return to recliner. VSS. Pt smiling more today. Thankful for walk. [place], [place] 01/21/2019 1:23 PM

## 2019-01-22 LAB — BASIC METABOLIC PANEL
Anion gap: 9 (ref 5–15)
BUN: 35 mg/dL — ABNORMAL HIGH (ref 8–23)
CO2: 31 mmol/L (ref 22–32)
Calcium: 8.5 mg/dL — ABNORMAL LOW (ref 8.9–10.3)
Chloride: 89 mmol/L — ABNORMAL LOW (ref 98–111)
Creatinine, Ser: 1.27 mg/dL — ABNORMAL HIGH (ref 0.61–1.24)
GFR calc Af Amer: >60 mL/min (ref >60)
GFR calc non Af Amer: 54 mL/min — ABNORMAL LOW (ref >60)
GLUCOSE: 149 mg/dL — AB (ref 70–99)
Potassium: 3.8 mmol/L (ref 3.5–5.1)
Sodium: 129 mmol/L — ABNORMAL LOW (ref 135–145)

## 2019-01-22 LAB — CBC
HCT: 29 % — ABNORMAL LOW (ref 39.0–52.0)
Hemoglobin: 9.1 g/dL — ABNORMAL LOW (ref 13.0–17.0)
MCH: 26.4 pg (ref 26.0–34.0)
MCHC: 31.4 g/dL (ref 30.0–36.0)
MCV: 84.1 fL (ref 80.0–100.0)
Platelets: 281 10*3/uL (ref 150–400)
RBC: 3.45 MIL/uL — ABNORMAL LOW (ref 4.22–5.81)
RDW: 20.1 % — ABNORMAL HIGH (ref 11.5–15.5)
WBC: 8.9 10*3/uL (ref 4.0–10.5)
nRBC: 0.2 % (ref 0.0–0.2)

## 2019-01-22 LAB — COOXEMETRY PANEL
CARBOXYHEMOGLOBIN: 2.4 % — AB (ref 0.5–1.5)
Methemoglobin: 0.9 % (ref 0.0–1.5)
O2 SAT: 60.7 %
Total hemoglobin: 11 g/dL — ABNORMAL LOW (ref 12.0–16.0)

## 2019-01-22 LAB — PROTIME-INR
INR: 1.32
Prothrombin Time: 16.3 seconds — ABNORMAL HIGH (ref 11.4–15.2)

## 2019-01-22 LAB — MAGNESIUM: Magnesium: 2.2 mg/dL (ref 1.7–2.4)

## 2019-01-22 MED ORDER — SENNOSIDES-DOCUSATE SODIUM 8.6-50 MG PO TABS
1.0000 | ORAL_TABLET | Freq: Two times a day (BID) | ORAL | Status: DC
  Administered 2019-01-22 – 2019-01-29 (×15): 1 via ORAL
  Filled 2019-01-22 (×15): qty 1

## 2019-01-22 MED ORDER — POLYETHYLENE GLYCOL 3350 17 G PO PACK
17.0000 g | PACK | Freq: Every day | ORAL | Status: DC
  Administered 2019-01-22 – 2019-01-29 (×8): 17 g via ORAL
  Filled 2019-01-22 (×9): qty 1

## 2019-01-22 NOTE — Plan of Care (Signed)
  Problem: Education: Goal: Individualized Educational Video(s) Outcome: Progressing   Problem: Activity: Goal: Capacity to carry out activities will improve Outcome: Progressing   Problem: Cardiac: Goal: Ability to achieve and maintain adequate cardiopulmonary perfusion will improve Outcome: Progressing   Problem: Health Behavior/Discharge Planning: Goal: Ability to manage health-related needs will improve Outcome: Progressing   Problem: Clinical Measurements: Goal: Ability to maintain clinical measurements within normal limits will improve Outcome: Progressing Goal: Will remain free from infection Outcome: Progressing Goal: Diagnostic test results will improve Outcome: Progressing Goal: Respiratory complications will improve Outcome: Progressing Goal: Cardiovascular complication will be avoided Outcome: Progressing   Problem: Activity: Goal: Risk for activity intolerance will decrease Outcome: Progressing   Problem: Education: Goal: Ability to demonstrate management of disease process will improve Outcome: Progressing Goal: Ability to verbalize understanding of medication therapies will improve Outcome: Progressing   Problem: Activity: Goal: Capacity to carry out activities will improve Outcome: Progressing   Problem: Cardiac: Goal: Ability to achieve and maintain adequate cardiopulmonary perfusion will improve Outcome: Progressing

## 2019-01-22 NOTE — NC FL2 (Signed)
[place] LEVEL OF CARE SCREENING TOOL     IDENTIFICATION  Patient Name: Spencer Rice Birthdate: Nov 17, 1941 Sex: male Admission Date (Current Location): 12/29/2018  [facility] and [place] Number:  Herbalist and Address:  [facility] [place], [place] [address] [place], [place] [address]      Provider Number: [id number]  Attending Physician Name and Address:  Jolaine Artist, MD  Relative Name and Phone Number:       Current Level of Care: Hospital Recommended Level of Care: [place] Prior Approval Number:    Date Approved/Denied:   PASRR Number: [id number]  Discharge Plan: SNF    Current Diagnoses: Patient Active Problem List   Diagnosis Date Noted  . Tachypnea   . Sinus tachycardia   . Hyponatremia   . Acute blood loss anemia   . DNR (do not resuscitate) discussion   . Palliative care by specialist   . Malnutrition of moderate degree 01/06/2019  . CKD (chronic kidney disease) stage 3, GFR 30-59 ml/min (HCC) 12/30/2018  . PVCs (premature ventricular contractions) 12/30/2018  . Acute on chronic systolic congestive heart failure ([place]) 12/29/2018  . CHF (congestive heart failure) ([place]) 05/03/2018  . Acute on chronic systolic (congestive) heart failure ([place]) 05/03/2018  . Glucose intolerance (impaired glucose tolerance) 06/23/2016  . Vitreous floaters 12/08/2015  . TIA (transient ischemic attack) 12/08/2015  . SOB (shortness of breath) 12/08/2015  . Posterior vitreous detachment of both eyes 12/08/2015  . Pain due to dental caries 12/08/2015  . Other abnormal glucose 12/08/2015  . Osteoarthrosis 12/08/2015  . Long term (current) use of anticoagulants 12/08/2015  . Kidney mass 12/08/2015  . Hypersomnolence 12/08/2015  . Hyperopia with presbyopia of both eyes 12/08/2015  . Hyperlipidemia 12/08/2015  . History of orthopnea 12/08/2015  . COPD (chronic obstructive pulmonary disease) ([place]) 12/08/2015  .  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 AKI (acute kidney injury) ([place]) 12/08/2015  . Abnormal CT scan, kidney 12/08/2015  . Abdominal bloating 12/08/2015  . Biventricular ICD (implantable cardioverter-defibrillator) in place 12/07/2015  . Chest pain at rest 11/29/2015  . Essential hypertension 11/29/2015  . Acute on chronic systolic CHF (congestive heart failure), NYHA class 1 ([place]) 11/29/2015  . Gastroesophageal reflux disease without esophagitis 11/29/2015  . Pain in the chest   . Renal cancer ([place]) 08/16/2015  . 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 Cancer of kidney ([place]) 01/19/2015  . Hematuria 09/04/2014  . Psychosexual dysfunction with inhibited sexual excitement 07/29/2014  . Pelvic pain in male 07/29/2014  . Increased urinary frequency 07/29/2014  . Heartburn 07/29/2014  . EKG, abnormal 07/29/2014  . Corneal scar 07/29/2014  . 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 Cardiomyopathy ([place]) 07/29/2014  . CAD (coronary artery disease) 07/29/2014  . Disorder of kidney and ureter 04/09/2014    Orientation RESPIRATION BLADDER Height & Weight     Self, Time, Situation, Place  O2(Nasal Canula 2 L) Continent Weight: 177 lb 14.6 oz (80.7 kg) Height:  6\' 1"  (185.4 cm)  BEHAVIORAL SYMPTOMS/MOOD NEUROLOGICAL BOWEL NUTRITION STATUS  (None) (None) Continent Diet(Heart healthy)  AMBULATORY STATUS COMMUNICATION OF NEEDS Skin   Limited Assist Verbally Other (Comment)(Cracking, Eczema, Excoriated.)                       Personal Care Assistance Level of Assistance              Functional Limitations Info  Sight, Hearing, Speech Sight Info: Adequate Hearing Info: Adequate Speech Info: Adequate    SPECIAL CARE FACTORS FREQUENCY  PT (By licensed PT), Blood pressure     PT Frequency: 5 x week              Contractures Contractures Info: Not present    Additional Factors Info  Code Status, Allergies Code Status Info: Full code Allergies  Info: NKDA           Current Medications (01/22/2019):  This is the current hospital active medication list Current Facility-Administered Medications  Medication Dose Route Frequency Provider Last Rate Last Dose  . 0.9 %  sodium chloride infusion  250 mL Intravenous PRN Bensimhon, Shaune Pascal, MD 5 mL/hr at 01/22/19 0300    . acetaminophen (TYLENOL) tablet 650 mg  650 mg Oral Q4H PRN Bensimhon, Shaune Pascal, MD   650 mg at 01/17/19 1755  . ALPRAZolam Duanne Moron) tablet 0.25 mg  0.25 mg Oral Once Bensimhon, Shaune Pascal, MD      . alum & mag hydroxide-simeth (MAALOX/MYLANTA) 200-200-20 MG/5ML suspension 30 mL  30 mL Oral Q6H PRN Bensimhon, Shaune Pascal, MD   30 mL at 01/13/19 0506  . amiodarone (PACERONE) tablet 200 mg  200 mg Oral Daily Bensimhon, Shaune Pascal, MD   200 mg at 01/22/19 0939  . benzonatate (TESSALON) capsule 100 mg  100 mg Oral TID PRN Ivin Poot, MD   100 mg at 01/21/19 2149  . Chlorhexidine Gluconate Cloth 2 % PADS 6 each  6 each Topical Daily Bensimhon, Shaune Pascal, MD   6 each at 01/22/19 1002  . enoxaparin (LOVENOX) injection 40 mg  40 mg Subcutaneous Q24H Shirley Friar, PA-C   40 mg at 01/22/19 1212  . feeding supplement (BOOST / RESOURCE BREEZE) liquid 1 Container  1 Container Oral BID BM Bensimhon, Shaune Pascal, MD   1 Container at 01/22/19 1222  . fentaNYL (SUBLIMAZE) injection 50 mcg  50 mcg Intravenous Q4H PRN Bensimhon, Shaune Pascal, MD   50 mcg at 01/19/19 2236  . lactobacillus (FLORANEX/LACTINEX) granules 1 g  1 g Oral TID WC Bensimhon, Shaune Pascal, MD   1 g at 01/22/19 1210  . magnesium hydroxide (MILK OF MAGNESIA) suspension 15 mL  15 mL Oral Daily PRN Bensimhon, Shaune Pascal, MD   15 mL at 01/15/19 1696  . MEDLINE mouth rinse  15 mL Mouth Rinse BID Bensimhon, Shaune Pascal, MD   15 mL at 01/22/19 0940  . milrinone (PRIMACOR) 20 MG/100 ML (0.2 mg/mL) infusion  0.25 mcg/kg/min Intravenous Continuous Bensimhon, Shaune Pascal, MD 6.16 mL/hr at 01/22/19 0507 0.25 mcg/kg/min at 01/22/19 0507  .  mometasone-formoterol (DULERA) 200-5 MCG/ACT inhaler 2 puff  2 puff Inhalation BID Bensimhon, Shaune Pascal, MD   2 puff at 01/22/19 0755  . multivitamin with minerals tablet 1 tablet  1 tablet Oral Daily Bensimhon, Shaune Pascal, MD   1 tablet at 01/22/19 [phone]  . ondansetron (ZOFRAN) injection 4 mg  4 mg Intravenous Q6H PRN Bensimhon, Shaune Pascal, MD      . pantoprazole (PROTONIX) EC tablet 40 mg  40 mg Oral Daily Bensimhon, Shaune Pascal, MD   40 mg at 01/22/19 0939  . polyethylene glycol (MIRALAX / GLYCOLAX) packet 17 g  17 g Oral Daily Shirley Friar, PA-C   17 g at 01/22/19 1221  . senna-docusate (Senokot-S) tablet 1 tablet  1 tablet Oral BID Shirley Friar, PA-C   1 tablet at 01/22/19 1221  .  sodium chloride flush (NS) 0.9 % injection 10-40 mL  10-40 mL Intracatheter Q12H Bensimhon, Shaune Pascal, MD   20 mL at 01/22/19 0941  . sodium chloride flush (NS) 0.9 % injection 10-40 mL  10-40 mL Intracatheter PRN Bensimhon, Shaune Pascal, MD      . sodium chloride flush (NS) 0.9 % injection 3 mL  3 mL Intravenous Once Bensimhon, Shaune Pascal, MD      . sodium chloride flush (NS) 0.9 % injection 3 mL  3 mL Intravenous Q12H Bensimhon, Shaune Pascal, MD   3 mL at 01/19/19 1011  . sodium chloride flush (NS) 0.9 % injection 3 mL  3 mL Intravenous PRN Bensimhon, Shaune Pascal, MD      . sodium chloride flush (NS) 0.9 % injection 3 mL  3 mL Intravenous Q12H Bensimhon, Shaune Pascal, MD   3 mL at 01/22/19 0940  . sodium chloride flush (NS) 0.9 % injection 3 mL  3 mL Intravenous PRN Bensimhon, Shaune Pascal, MD   3 mL at 01/19/19 2243  . spironolactone (ALDACTONE) tablet 12.5 mg  12.5 mg Oral QHS Bensimhon, Shaune Pascal, MD   12.5 mg at 01/21/19 2149  . tamsulosin (FLOMAX) capsule 0.8 mg  0.8 mg Oral QPC supper Bensimhon, Shaune Pascal, MD   0.8 mg at 01/21/19 1757     Discharge Medications: Please see discharge summary for a list of discharge medications.  Relevant Imaging Results:  Relevant Lab Results:   Additional Information SS#:  [id number]. New Milrinone.  Candie Chroman, LCSW

## 2019-01-22 NOTE — Progress Notes (Addendum)
Patient ID: Spencer Rice, male   DOB: 1941/03/02, 78 y.o.   MRN: [id number]     Advanced Heart Failure Rounding Note  PCP-Cardiologist: No primary care provider on file.   Subjective:    Developed large spontaneous RP bleed on heparin 2/10. 2/11 received 2UPRBCs. Hgb 9 => 8.5 => 9.1.   Coox 60.7% this am on milrinone 0.25 mcg. Cr stable at 1.27. CVP 3-4 this am.   Feeling OK. Denies SOB or lightheadedness getting up to bathroom. No BM since Monday (says he usually goes daily). Noone has walked him yet today.   Spoke with CM yesterday about need for SNF. Updated PT consult to consider SNF. No movement yet. Pending SW consult.   [facility] 01/07/19  Ost RCA lesion is 50% stenosed.  Mid RCA lesion is 40% stenosed.  Prox LAD lesion is 50% stenosed.  Ost 2nd Diag to 2nd Diag lesion is 100% stenosed. Findings: Done on milrinone 0.25 mcg/kg/min RA = 7  RV = 62/8 PA = 65/26 (41) PCW = 19 Fick cardiac output/index = 5.6/2.7 PVR = 3.9 WU FA sat = 95% PA sat = 65%, 66% PaPI = 5.6 Assessment: 1. Mild non-obstructive CAD 2. Moderate PAH with normal PAPI 3. Normal CO on milrinone  ECHO 01/01/2019  EF 10% with severe Spencer, severe TR, Biatrial enlargement, RV moderately reduced function.   Objective:   Weight Range: 80.7 kg Body mass index is 23.47 kg/m.   Vital Signs:   Temp:  [98 F (36.7 C)-98.2 F (36.8 C)] 98 F (36.7 C) (02/19 0806) Pulse Rate:  [75-88] 79 (02/19 0635) Resp:  [11-21] 17 (02/19 0635) BP: (94-100)/(61-66) 100/66 (02/19 0635) SpO2:  [98 %-100 %] 99 % (02/19 0755) Weight:  [80.7 kg] 80.7 kg (02/19 0454) Last BM Date: 01/21/19  Weight change: Filed Weights   01/20/19 0300 01/21/19 0500 01/22/19 0454  Weight: 82.5 kg 83.5 kg 80.7 kg    Intake/Output:   Intake/Output Summary (Last 24 hours) at 01/22/2019 1020 Last data filed at 01/22/2019 0818 Gross per 24 hour  Intake 346.69 ml  Output 650 ml  Net -303.31 ml    Physical Exam   General:  NAD HEENT: Normal Neck: Supple. JVP not elevated. Carotids 2+ bilat; no bruits. No thyromegaly or nodule noted. Cor: PMI nondisplaced. RRR, No M/G/R noted Lungs: CTAB, normal effort. Abdomen: Soft, non-tender, non-distended, no HSM. No bruits or masses. +BS  Extremities: No cyanosis, clubbing, or rash. R and LLE no edema.  Neuro: Alert & orientedx3, cranial nerves grossly intact. moves all 4 extremities w/o difficulty. Affect pleasant   Telemetry   NSR with BiV 70-80s, personally reviewed.   Labs    CBC Recent Labs    01/21/19 0405 01/22/19 0435  WBC 8.9 8.9  HGB 9.1* 9.1*  HCT 28.0* 29.0*  MCV 84.1 84.1  PLT 257 119   Basic Metabolic Panel Recent Labs    01/21/19 0405 01/22/19 0435  NA 129* 129*  K 3.5 3.8  CL 88* 89*  CO2 30 31  GLUCOSE 172* 149*  BUN 33* 35*  CREATININE 1.33* 1.27*  CALCIUM 8.2* 8.5*  MG 1.8 2.2   Liver Function Tests No results for input(s): AST, ALT, ALKPHOS, BILITOT, PROT, ALBUMIN in the last 72 hours. No results for input(s): LIPASE, AMYLASE in the last 72 hours. Cardiac Enzymes No results for input(s): CKTOTAL, CKMB, CKMBINDEX, TROPONINI in the last 72 hours.  BNP: BNP (last 3 results) Recent Labs    12/04/18 1743 12/28/18 1821  12/29/18 1943  BNP >4,500.0* >4,925.0* >4,500.0*    ProBNP (last 3 results) No results for input(s): PROBNP in the last 8760 hours.   D-Dimer No results for input(s): DDIMER in the last 72 hours. Hemoglobin A1C No results for input(s): HGBA1C in the last 72 hours. Fasting Lipid Panel No results for input(s): CHOL, HDL, LDLCALC, TRIG, CHOLHDL, LDLDIRECT in the last 72 hours. Thyroid Function Tests No results for input(s): TSH, T4TOTAL, T3FREE, THYROIDAB in the last 72 hours.  Invalid input(s): FREET3  Other results:   Imaging    No results found.   Medications:     Scheduled Medications: . ALPRAZolam  0.25 mg Oral Once  . amiodarone  200 mg Oral Daily  . Chlorhexidine Gluconate  Cloth  6 each Topical Daily  . enoxaparin (LOVENOX) injection  40 mg Subcutaneous Q24H  . feeding supplement  1 Container Oral BID BM  . lactobacillus  1 g Oral TID WC  . mouth rinse  15 mL Mouth Rinse BID  . mometasone-formoterol  2 puff Inhalation BID  . multivitamin with minerals  1 tablet Oral Daily  . pantoprazole  40 mg Oral Daily  . sodium chloride flush  10-40 mL Intracatheter Q12H  . sodium chloride flush  3 mL Intravenous Once  . sodium chloride flush  3 mL Intravenous Q12H  . sodium chloride flush  3 mL Intravenous Q12H  . spironolactone  12.5 mg Oral QHS  . tamsulosin  0.8 mg Oral QPC supper    Infusions: . sodium chloride 5 mL/hr at 01/22/19 0300  . 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    Admitted with marked volume overload and cardiogenic shock .   Assessment/Plan   1. A/C Systolic Heart Failure -> cardiogenic shock: Due to primarily to nonischemic cardiomyopathy (out of proportion to CAD). [place] 2015 with moderate disease. Has Biotronik BiV ICD.  Initial CO-OX 38% so milrinone started on 1/28. ECHO 01/01/19 EF 10% with severe Spencer/TR, biatrial enlargement, RV with moderately reduced function.  - Coox 60.7% on milrinone 0.25 mcg/kg/min. Continue to follow.  - CVP 3-4. Continue to hold torsemide today.  - Discussed at [place] on 2/10 and would be possible candidate once RP bleed improves. Likely will need to go Rehab for several weeks to recover then reassess. Not candidate for CIR.  Hope he will meet criteria for SNF.  - Continue UNNA boots.  2. RP bleed with symptomatic anemia - Spontaneous while on heparin (? Traumatic component). Was on heparin for h/o LV clot (no longer present) - Has some tenderness in left groin due to extension of hematoma seen on CT - Received 2U PRBCs on 2/10  - Hgb 9.1 today.    - Continue SCDs in bed. Back on lovenox 40 daily for DVT prophylaxis  3. CKD Stage III, Previously perceived creatinine baseline 1.8-2.4.   - Creatinine 1.27 today.     4. COPD on chronic home oxygen: PFTs 01/10/19 with restriction and obstruction. DLCO 53% (14.64) - No change to current plan.    5. L Kidney Cancer:  Had cryoablation in 2015. Stable.   6. PVCs: Started on amiodarone 2019 to suppress PVCs.  - Continue amiodarone 200 mg daily. No change.    7. Mitral Regurgitation/Tricuspid Regurgitation: Severe Spencer/TR on ECHO 01/01/19.  - No change to current plan.    8. UTI, Klebsiella pneumoniae  - Completed rocephin course. No change.   9. Pulmonary nodule: 6 mm nodule in LUL noted on CT scan.  - Recommended 6 month repeat non contrast CT. Dr Haroldine Laws discussed with Dr Prescott Gum and okay to move forward with VAD workup. No change.   10. H/O [place] Thrombus - Off coumadin due to RP hematoma. Now on prophylactic Lovenox.  - No change.    11. Deconditioning - Turned down for CIR.  - Have asked PT to re-evaluate for SNF, and discussed with CM. Awaiting SW consult.  Will discuss at progression.   Length of Stay: 24  Shirley Friar, [place]  10:20 AM  Advanced Heart Failure Team Pager [phone] (M-F; 7a - 4p)  Please contact [facility] for night-coverage after hours (4p -7a ) and weekends on [email or web address]  Patient seen and examined with the above-signed Advanced Practice Provider and/or Housestaff. I personally reviewed laboratory data, imaging studies and relevant notes. I independently examined the patient and formulated the important aspects of the  plan. I have edited the note to reflect any of my changes or salient points. I have personally discussed the plan with the patient and/or family.  Continues to progress well. Walking further with PT. Remains on milrinone.  CVP down. Co-ox ok. RP bleed stable. Will plan transfer to SNF with home milrinone (will need tunneled PICC). Will revisit candidacy for VAD in several weeks.  Glori Bickers, MD  4:08 PM

## 2019-01-22 NOTE — Progress Notes (Addendum)
CVP-10  0000-15  0400-12

## 2019-01-22 NOTE — Clinical Social Work Placement (Signed)
   CLINICAL SOCIAL WORK PLACEMENT  NOTE  Date:  01/22/2019  Patient Details  Name: Yamil Oelke MRN: [id number] Date of Birth: 1941-08-01  Clinical Social Work is seeking post-discharge placement for this patient at the [place] level of care (*CSW will initial, date and re-position this form in  chart as items are completed):      Patient/family provided with [employer] Work Department's list of facilities offering this level of care within the geographic area requested by the patient (or if unable, by the patient's family).      Patient/family informed of their freedom to choose among providers that offer the needed level of care, that participate in Medicare, Medicaid or managed care program needed by the patient, have an available bed and are willing to accept the patient.      Patient/family informed of [facility] [employer]'s ownership interest in [facility] and [facility], as well as of the fact that they are under no obligation to receive care at these facilities.  PASRR submitted to EDS on 01/22/19     PASRR number received on 01/22/19     Existing PASRR number confirmed on       FL2 transmitted to all facilities in geographic area requested by pt/family on 01/22/19     FL2 transmitted to all facilities within larger geographic area on       Patient informed that his/her managed care company has contracts with or will negotiate with certain facilities, including the following:            Patient/family informed of bed offers received.  Patient chooses bed at       Physician recommends and patient chooses bed at      Patient to be transferred to   on  .  Patient to be transferred to facility by       Patient family notified on   of transfer.  Name of family member notified:        PHYSICIAN Please sign FL2     Additional Comment:    _______________________________________________ Candie Chroman, LCSW 01/22/2019, 1:41  PM

## 2019-01-22 NOTE — Progress Notes (Signed)
Physical Therapy Treatment Patient Details Name: Spencer Rice MRN: [id number] DOB: July 07, 1941 Today's Date: 01/22/2019    History of Present Illness 78yo male with ongoing SOB, chest x-ray showing pulmonary edema and R pleural effusion. Admitted for CHF exacerbation. Pt developed retroperitoneal bleed. 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 He required min guard assist transfers and ambulation 200 feet with RW. Pt remains O2 dependent at 2 L. Updated d/c recommendations to SNF due to safety and wife unable to provide needed level of assist at home.     Follow Up Recommendations  SNF;Supervision/Assistance - 24 hour     Equipment Recommendations  Rolling walker with 5" wheels    Recommendations for Other Services       Precautions / Restrictions Precautions Precautions: Fall Restrictions Weight Bearing Restrictions: No    Mobility  Bed Mobility               General bed mobility comments: Up in chair  Transfers Overall transfer level: Needs assistance Equipment used: Rolling walker (2 wheeled) Transfers: Sit to/from Stand Sit to Stand: Min guard         General transfer comment: Assist for safety and lines, cues for sequencing  Ambulation/Gait Ambulation/Gait assistance: Min guard Gait Distance (Feet): 200 Feet Assistive device: Rolling walker (2 wheeled) Gait Pattern/deviations: Step-through pattern;Decreased stride length;Trunk flexed Gait velocity: decreased  Gait velocity interpretation: <1.31 ft/sec, indicative of household ambulator General Gait Details: Pt ambulated on 2 L O2 with SpO2 99%.   Stairs             Wheelchair Mobility    Modified Rankin (Stroke Patients Only)       Balance Overall balance assessment: Needs assistance Sitting-balance support: No upper extremity supported;Feet supported Sitting  balance-Leahy Scale: Good     Standing balance support: Bilateral upper extremity supported;During functional activity Standing balance-Leahy Scale: Poor Standing balance comment: reliant on RW                            Cognition Arousal/Alertness: Awake/alert Behavior During Therapy: Flat affect Overall Cognitive Status: Within Functional Limits for tasks assessed                                        Exercises      General Comments        Pertinent Vitals/Pain Pain Assessment: No/denies pain    Home Living                      Prior Function            PT Goals (current goals can now be found in the care plan section) Acute Rehab PT Goals Patient Stated Goal: go home PT Goal Formulation: With patient Time For Goal Achievement: 01/31/19 Potential to Achieve Goals: Good Progress towards PT goals: Progressing toward goals    Frequency    Min 2X/week      PT Plan Discharge plan needs to be updated;Frequency needs to be updated    Co-evaluation              AM-PAC PT "6 Clicks" Mobility   Outcome Measure  Help needed turning from your back to your side  while in a flat bed without using bedrails?: A Little Help needed moving from lying on your back to sitting on the side of a flat bed without using bedrails?: A Little Help needed moving to and from a bed to a chair (including a wheelchair)?: A Little Help needed standing up from a chair using your arms (e.g., wheelchair or bedside chair)?: A Little Help needed to walk in hospital room?: A Little Help needed climbing 3-5 steps with a railing? : A Little 6 Click Score: 18    End of Session Equipment Utilized During Treatment: Oxygen;Gait belt Activity Tolerance: Patient tolerated treatment well Patient left: in chair;with call bell/phone within reach Nurse Communication: Mobility status PT Visit Diagnosis: Muscle weakness (generalized) (M62.81);Other  abnormalities of gait and mobility (R26.89)     Time: 1660-6004 PT Time Calculation (min) (ACUTE ONLY): 18 min  Charges:  $Gait Training: 8-22 mins                     Lorrin Goodell, PT  Office # [phone] Pager [phone]    Lorriane Shire 01/22/2019, 12:01 PM

## 2019-01-22 NOTE — Progress Notes (Signed)
CARDIAC REHAB PHASE I   PRE:  Rate/Rhythm: 89 pacing    BP: sitting 94/65    SaO2: 100 2L  MODE:  Ambulation: 280 ft   POST:  Rate/Rhythm: 94 pacing    BP: sitting 110/73     SaO2: 100 2L  Tolerated well with RW. Tired at end of walk. Return to recliner.  [place], ACSM 01/22/2019 3:21 PM

## 2019-01-22 NOTE — Clinical Social Work Note (Signed)
Clinical Social Work Assessment  Patient Details  Name: Spencer Rice MRN: [id number] Date of Birth: 02/28/1941  Date of referral:  01/22/19               Reason for consult:  Facility Placement, Discharge Planning                Permission sought to share information with:  Chartered certified accountant granted to share information::  Yes, Verbal Permission Granted  Name::        Agency::  SNF's  Relationship::     Contact Information:     Housing/Transportation Living arrangements for the past 2 months:  Single Family Home Source of Information:  Patient, Medical Team Patient Interpreter Needed:  None Criminal Activity/Legal Involvement Pertinent to Current Situation/Hospitalization:  No - Comment as needed Significant Relationships:  Adult Children, Spouse Lives with:  Spouse Do you feel safe going back to the place where you live?  Yes Need for family participation in patient care:  Yes (Comment)  Care giving concerns:  PT recommending SNF. New Milrinone.   Social Worker assessment / plan:  CSW met with patient. No supports at bedside. CSW introduced role and explained that discharge planning would be discussed. Patient is aware PT is now recommending SNF and that his medical team is also recommending it due to new need for Milrinone. Patient is agreeable to SNF placement. Informed patient that it is possible his only option may be [facility]. Patient concerned about the distance from home. Sent referral to all other facilities as well to see if anyone else is able to manage the Milrinone. No further concerns. CSW encouraged patient to contact CSW as needed. CSW will continue to follow patient for support and facilitate discharge to SNF once facility chosen and insurance authorization obtained.  Employment status:  Retired Nurse, adult PT Recommendations:  [place] / Referral to community resources:   [place]  Patient/Family's Response to care:  Patient agreeable to SNF placement. Patient's family supportive and involved in patient's care. Patient appreciated social work intervention.  Patient/Family's Understanding of and Emotional Response to Diagnosis, Current Treatment, and Prognosis:  Patient has a good understanding of the reason for admission and his need for SNF placement after discharge. Patient appears pleased with hospital care.  Emotional Assessment Appearance:  Appears stated age Attitude/Demeanor/Rapport:  Engaged, Gracious Affect (typically observed):  Accepting, Appropriate, Calm, Pleasant Orientation:  Oriented to Self, Oriented to Place, Oriented to  Time, Oriented to Situation Alcohol / Substance use:  Never Used Psych involvement (Current and /or in the community):  No (Comment)  Discharge Needs  Concerns to be addressed:  Care Coordination Readmission within the last 30 days:  No Current discharge risk:  Other(New Milrinone.) Barriers to Discharge:  Continued Medical Work up, [place], LCSW 01/22/2019, 1:37 PM

## 2019-01-23 ENCOUNTER — Inpatient Hospital Stay (HOSPITAL_COMMUNITY): Payer: Medicare Other

## 2019-01-23 ENCOUNTER — Encounter (HOSPITAL_COMMUNITY): Payer: Self-pay | Admitting: Interventional Radiology

## 2019-01-23 DIAGNOSIS — R531 Weakness: Secondary | ICD-10-CM

## 2019-01-23 HISTORY — PX: IR US GUIDE VASC ACCESS RIGHT: IMG2390

## 2019-01-23 HISTORY — PX: IR FLUORO GUIDE CV LINE RIGHT: IMG2283

## 2019-01-23 LAB — COOXEMETRY PANEL
Carboxyhemoglobin: 2.2 % — ABNORMAL HIGH (ref 0.5–1.5)
Methemoglobin: 1.7 % — ABNORMAL HIGH (ref 0.0–1.5)
O2 SAT: 64.7 %
TOTAL HEMOGLOBIN: 10.1 g/dL — AB (ref 12.0–16.0)

## 2019-01-23 LAB — MAGNESIUM: Magnesium: 2 mg/dL (ref 1.7–2.4)

## 2019-01-23 LAB — BASIC METABOLIC PANEL
Anion gap: 11 (ref 5–15)
BUN: 32 mg/dL — ABNORMAL HIGH (ref 8–23)
CO2: 26 mmol/L (ref 22–32)
CREATININE: 1.1 mg/dL (ref 0.61–1.24)
Calcium: 8.1 mg/dL — ABNORMAL LOW (ref 8.9–10.3)
Chloride: 93 mmol/L — ABNORMAL LOW (ref 98–111)
GFR calc Af Amer: >60 mL/min (ref >60)
GFR calc non Af Amer: >60 mL/min (ref >60)
Glucose, Bld: 133 mg/dL — ABNORMAL HIGH (ref 70–99)
Potassium: 3.8 mmol/L (ref 3.5–5.1)
Sodium: 130 mmol/L — ABNORMAL LOW (ref 135–145)

## 2019-01-23 LAB — CBC
HCT: 30.7 % — ABNORMAL LOW (ref 39.0–52.0)
Hemoglobin: 9.4 g/dL — ABNORMAL LOW (ref 13.0–17.0)
MCH: 26.2 pg (ref 26.0–34.0)
MCHC: 30.6 g/dL (ref 30.0–36.0)
MCV: 85.5 fL (ref 80.0–100.0)
Platelets: 294 10*3/uL (ref 150–400)
RBC: 3.59 MIL/uL — ABNORMAL LOW (ref 4.22–5.81)
RDW: 20.5 % — ABNORMAL HIGH (ref 11.5–15.5)
WBC: 8.8 10*3/uL (ref 4.0–10.5)
nRBC: 0.2 % (ref 0.0–0.2)

## 2019-01-23 LAB — PROTIME-INR
INR: 1.33
Prothrombin Time: 16.4 seconds — ABNORMAL HIGH (ref 11.4–15.2)

## 2019-01-23 MED ORDER — CEFAZOLIN SODIUM-DEXTROSE 2-4 GM/100ML-% IV SOLN: 2.0000 g | INTRAVENOUS | Status: DC

## 2019-01-23 MED ORDER — LIDOCAINE HCL 1 % IJ SOLN
INTRAMUSCULAR | Status: AC
  Filled 2019-01-23: qty 20

## 2019-01-23 MED ORDER — CLINDAMYCIN PHOSPHATE 900 MG/50ML IV SOLN
900.0000 mg | Freq: Once | INTRAVENOUS | Status: AC
  Administered 2019-01-23: 900 mg via INTRAVENOUS
  Filled 2019-01-23: qty 50

## 2019-01-23 MED ORDER — LIDOCAINE HCL 1 % IJ SOLN
INTRAMUSCULAR | Status: DC | PRN
  Administered 2019-01-23: 5 mL

## 2019-01-23 NOTE — Procedures (Signed)
  Procedure: R IJ tunneled SL CVC   EBL:   minimal Complications:  none immediate  See full dictation in [facility] PACS.  D. Evalena Fujii MD Main # [phone] Pager  [phone]    

## 2019-01-23 NOTE — Clinical Social Work Note (Addendum)
[facility] has offered a bed. [facility] in [facility] is trying to get approval to take patient. [facility] in [place] has got approval through DON but needs to see if their pharmacy can supply Milrinone. Received call from Carolynn Sayers with [facility]. She has spoken with admissions coordinator at [facility] because they have managed Milrinone pumps in the past so she will review referral.  Dayton Scrape, CSW [phone]  10:14 am [facility] unable to offer a bed. [facility] has offered a bed but want to coordinate an inservice with [facility] to train staff that have not worked with pump before. CSW will notify patient of his two options and see which he prefers.  Dayton Scrape, [place] [phone]  11:12 am Patient off unit. Left CMS Medicare scores for [facility] and [facility] in room for him to review. RN/Secretary will make him aware when he returns. Will follow up this afternoon and get a decision.  Dayton Scrape, [place]

## 2019-01-23 NOTE — Progress Notes (Signed)
Patient ID: Spencer [name] [name], male   DOB: 09-03-1941, 78 y.o.   MRN: [id number]     Advanced Heart Failure Rounding Note  PCP-Cardiologist: No primary care provider on file.   Subjective:    Developed large spontaneous RP bleed on heparin 2/10. 2/11 received 2UPRBCs.   Remains on milrinone. 0.25. CVP 4 Feels good. No CP or SOB. CVP 4-5  hgb up to 9.4  [facility] 01/07/19  Ost RCA lesion is 50% stenosed.  Mid RCA lesion is 40% stenosed.  Prox LAD lesion is 50% stenosed.  Ost 2nd Diag to 2nd Diag lesion is 100% stenosed. Findings: Done on milrinone 0.25 mcg/kg/min RA = 7  RV = 62/8 PA = 65/26 (41) PCW = 19 Fick cardiac output/index = 5.6/2.7 PVR = 3.9 WU FA sat = 95% PA sat = 65%, 66% PaPI = 5.6 Assessment: 1. Mild non-obstructive CAD 2. Moderate PAH with normal PAPI 3. Normal CO on milrinone  ECHO 01/01/2019  EF 10% with severe Spencer [name], severe TR, Biatrial enlargement, RV moderately reduced function.   Objective:   Weight Range: 81.5 kg Body mass index is 23.71 kg/m.   Vital Signs:   Temp:  [97.9 F (36.6 C)-98.5 F (36.9 C)] 98.4 F (36.9 C) (02/20 0500) Pulse Rate:  [73-98] 73 (02/20 0500) Resp:  [15-21] 15 (02/20 0500) BP: (98-118)/(65-98) 99/70 (02/20 0500) SpO2:  [97 %-100 %] 97 % (02/20 0500) Weight:  [81.5 kg] 81.5 kg (02/20 0500) Last BM Date: 01/22/19  Weight change: Filed Weights   01/21/19 0500 01/22/19 0454 01/23/19 0500  Weight: 83.5 kg 80.7 kg 81.5 kg    Intake/Output:   Intake/Output Summary (Last 24 hours) at 01/23/2019 0616 Last data filed at 01/23/2019 0401 Gross per 24 hour  Intake 515.88 ml  Output 700 ml  Net -184.12 ml    Physical Exam   General:  Well appearing. No resp difficulty HEENT: normal Neck: supple. no JVD. Carotids 2+ bilat; no bruits. No lymphadenopathy or thryomegaly appreciated. Cor: PMI laterally displaced. Regular rate & rhythm 2/6 Spencer [name] Lungs: clear Abdomen: soft, nontender, nondistended. No hepatosplenomegaly. No  bruits or masses. Good bowel sounds. Extremities: no cyanosis, clubbing, rash, edema + PICC Neuro: alert & orientedx3, cranial nerves grossly intact. moves all 4 extremities w/o difficulty. Affect pleasant   Telemetry   NSR with BiV 70s, personally reviewed.   Labs    CBC Recent Labs    01/22/19 0435 01/23/19 0300  WBC 8.9 8.8  HGB 9.1* 9.4*  HCT 29.0* 30.7*  MCV 84.1 85.5  PLT 281 163   Basic Metabolic Panel Recent Labs    01/22/19 0435 01/23/19 0300  NA 129* 130*  K 3.8 3.8  CL 89* 93*  CO2 31 26  GLUCOSE 149* 133*  BUN 35* 32*  CREATININE 1.27* 1.10  CALCIUM 8.5* 8.1*  MG 2.2 2.0   Liver Function Tests No results for input(s): AST, ALT, ALKPHOS, BILITOT, PROT, ALBUMIN in the last 72 hours. No results for input(s): LIPASE, AMYLASE in the last 72 hours. Cardiac Enzymes No results for input(s): CKTOTAL, CKMB, CKMBINDEX, TROPONINI in the last 72 hours.  BNP: BNP (last 3 results) Recent Labs    12/04/18 1743 12/28/18 1821 12/29/18 1943  BNP >4,500.0* >4,925.0* >4,500.0*    ProBNP (last 3 results) No results for input(s): PROBNP in the last 8760 hours.   D-Dimer No results for input(s): DDIMER in the last 72 hours. Hemoglobin A1C No results for input(s): HGBA1C in the last 72 hours. Fasting Lipid  Panel No results for input(s): CHOL, HDL, LDLCALC, TRIG, CHOLHDL, LDLDIRECT in the last 72 hours. Thyroid Function Tests No results for input(s): TSH, T4TOTAL, T3FREE, THYROIDAB in the last 72 hours.  Invalid input(s): FREET3  Other results:   Imaging    No results found.   Medications:     Scheduled Medications: . ALPRAZolam  0.25 mg Oral Once  . amiodarone  200 mg Oral Daily  . Chlorhexidine Gluconate Cloth  6 each Topical Daily  . enoxaparin (LOVENOX) injection  40 mg Subcutaneous Q24H  . feeding supplement  1 Container Oral BID BM  . lactobacillus  1 g Oral TID WC  . mouth rinse  15 mL Mouth Rinse BID  . mometasone-formoterol  2 puff  Inhalation BID  . multivitamin with minerals  1 tablet Oral Daily  . pantoprazole  40 mg Oral Daily  . polyethylene glycol  17 g Oral Daily  . senna-docusate  1 tablet Oral BID  . sodium chloride flush  10-40 mL Intracatheter Q12H  . sodium chloride flush  3 mL Intravenous Once  . sodium chloride flush  3 mL Intravenous Q12H  . sodium chloride flush  3 mL Intravenous Q12H  . spironolactone  12.5 mg Oral QHS  . tamsulosin  0.8 mg Oral QPC supper    Infusions: . sodium chloride 5 mL/hr at 01/22/19 0300  . 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    Admitted with marked volume overload and cardiogenic shock .   Assessment/Plan   1. A/C Systolic Heart Failure -> cardiogenic shock: Due to primarily to nonischemic cardiomyopathy (out of proportion to CAD). [place] 2015 with moderate disease. Has Biotronik BiV ICD.  Initial CO-OX 38% so milrinone started on 1/28. ECHO 01/01/19 EF 10% with severe Spencer [name]/TR, biatrial enlargement, RV with moderately reduced function.  - Coox 64.7% on milrinone 0.25 mcg/kg/min. Continue to follow.  - CVP 4. Continue to hold torsemide today.  - Discussed at [facility] [facility] on 2/10 and would be possible candidate once RP bleed improves. Likely will need to go Rehab for several weeks to recover then reassess. Not candidate for CIR. He has met criteria for SNF.  - Continue UNNA boots.  2. RP bleed with symptomatic anemia - Spontaneous while on heparin (? Traumatic component). Was on heparin for h/o LV clot (no  longer present) - Has some tenderness in left groin due to extension of hematoma seen on CT. This is improved - Received 2U PRBCs on 2/10  - Hgb 941 today.    -Back on lovenox 40 daily for DVT prophylaxis  3. CKD Stage III, Previously perceived creatinine baseline 1.8-2.4.   - Creatinine 1.10 today.     4. COPD on chronic home oxygen: PFTs 01/10/19 with restriction and obstruction. DLCO 53% (14.64) - No change to current plan.    5. L Kidney Cancer: Had cryoablation in 2015. Stable.   6. PVCs: Started on amiodarone 2019 to suppress PVCs.  - Continue amiodarone 200 mg daily. No change.    7. Mitral Regurgitation/Tricuspid Regurgitation: Severe Spencer [name]/TR on ECHO 01/01/19.  -  No change to current plan.    8. UTI, Klebsiella pneumoniae  - Completed rocephin course. No change.   9. Pulmonary nodule: 6 mm nodule in LUL noted on CT scan.  - Recommended 6 month repeat non contrast CT. Dr Haroldine Laws discussed with Dr Prescott Gum and okay to move forward with VAD workup. No change.   10. H/O Mural Thrombus - Off coumadin due to RP hematoma. Now on prophylactic Lovenox.  - No change.    11. Deconditioning - Turned down for CIR.  - Approved for SNF  He has stabilized. Hopefully can get him to SNF today on milrinone (will be [facility]). Will need tunneled PICC placed by IR.   Length of Stay: [place], MD  6:16 AM  Advanced Heart Failure Team Pager [phone] (M-F; 7a - 4p)  Please contact [facility] for night-coverage after hours (4p -7a ) and weekends on [email or web address]

## 2019-01-23 NOTE — Progress Notes (Signed)
pT RETURNED FROM  PROCEDURAL AREA FOR  Tunneled picc line. Site appears well. No issues.

## 2019-01-23 NOTE — Discharge Summary (Addendum)
Advanced Heart Failure Discharge Note  Discharge Summary   Patient ID: Spencer [name] MRN: [id number], DOB/AGE: [birth year] 78 y.o. Admit date: 12/29/2018 D/C date:     01/29/2019   Primary Discharge Diagnoses:  1. A/C Systolic Heart Failure -> cardiogenic shock 2. NICMEF 10-155 3. Retrperitoneal bleed with symptomatic anemia due to acute blood loss 4. CKD Stage III 5. COPD on chronic home oxygen 6. L Kidney Cancer 7. PVCs 8. 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  9. UTI, Klebsiella pneumoniae  10. Pulmonary nodule: 6 mm nodule in LUL noted on CT scan.  11. H/O Mural Thrombus  Hospital Course:   Spencer [name] is a 78 y.o. 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  Admitted 12/29/2018 with volume overload and cardiogenic shock. Initially diuresed with IV lasix, and Creatinine trended up. PICC placed 1/27 with CVP 26. Initial coox 38% -> Milrinone started with improved diuresis and renal functions. Lasix transitioned to lasix gtt. ECHO 01/01/19 EF 10% with severe MR/TR, biatrial enlargement, RV with moderately reduced function. Continued to diurese through weekend, Meds adjusted as tolerated.   Taken for [facility] 01/07/19 with Mild, non-obstructive CAD, Moderate PAH with normal PAPI, and normal CO on milrinone 0.25 mcg/kg/min.   PFTs 01/10/2001 FVC 2.28 (54%), FEV 2.0 (64%), DLCO 14.64 (53%). Continued to diurese.   Discussed for LVAD consideration at [facility], and had planned to move forward, but patient had spontaneous RP bleed with symptomatic anemia complicating his admission. VAD team plan is to rehab for several weeks on Inotrope support and re-assess for VAD. LV thrombus not seen on TEE this admission, so anticoagulation held in setting of RP bleed.   Hospital course additionally complicated by Klebsiella pneumoniae UTI.  Treated with Rocephin. Continued on his chronic home O2 for chronic hypoxic resp failures.  PT initially recommended CIR, but patient turned down. HHPT recommended, but with inadequate support at home, recommendation changed to SNF. This was arranged via SW and CM. Arranged for management of his outpatient inotrope support with [facility]. Insurance initially denied SNF, but approved with peer to peer.   Medications adjusted over weekend and continued to require intermittent IV lasix. Transitioned to po torsemide at 40 mg BID 01/28/2019.   Pt examined am of 01/29/19 and determined stable for discharge to SNF with inotrope support provided/managed by [facility]. Close follow up as below.   Physical Exam CVP 4-5 General: Elderly appearing. No resp difficulty. HEENT: Normal Neck: Supple. JVP ~5 cm. Carotids 2+ bilat; no bruits. No thyromegaly or nodule noted. Cor: PMI nondisplaced. RRR, 2/6 MR/TR, +S3, + Tunneled PICC site stable.  Lungs: CTAB, normal effort. Abdomen: Soft, non-tender, non-distended, no HSM. No bruits or masses. +BS  Extremities: No cyanosis, clubbing, or rash.  Neuro: Alert & orientedx3, cranial nerves grossly intact. moves all 4 extremities w/o difficulty. Affect flat but appropriate.   Discharge Weight Range: 177.47 lbs Discharge Vitals: Blood pressure 101/75, pulse 63, temperature 98.1 F (36.7 C), temperature source Oral, resp. rate 18, height 6\' 1"  (1.854 m), weight 80.5 kg, SpO2 99 %.  Labs: Lab Results  Component Value Date   WBC 7.2 01/29/2019   HGB 9.5 (L) 01/29/2019   HCT 30.2 (L) 01/29/2019   MCV 86.5 01/29/2019   PLT 222 01/29/2019    Recent Labs  Lab 01/29/19 0530  NA 134*  K 3.2*  CL 96*  CO2 28  BUN 43*  CREATININE 1.41*  CALCIUM 8.4*  GLUCOSE 159*   Lab Results  Component Value Date   CHOL 98 01/06/2019   HDL 46 01/06/2019   LDLCALC 41 01/06/2019   TRIG 57 01/06/2019   BNP (last 3 results) Recent Labs     12/04/18 1743 12/28/18 1821 12/29/18 1943  BNP >4,500.0* >4,925.0* >4,500.0*    ProBNP (last 3 results) No results for input(s): PROBNP in the last 8760 hours.   Diagnostic Studies/Procedures   [facility] 01/07/19  Ost RCA lesion is 50% stenosed.  Mid RCA lesion is 40% stenosed.  Prox LAD lesion is 50% stenosed.  Ost 2nd Diag to 2nd Diag lesion is 100% stenosed. Findings: Done on milrinone 0.25 mcg/kg/min RA = 7  RV = 62/8 PA = 65/26 (41) PCW = 19 Fick cardiac output/index = 5.6/2.7 PVR = 3.9 WU FA sat = 95% PA sat = 65%, 66% PaPI = 5.6 Assessment: 1. Mild non-obstructive CAD 2. Moderate PAH with normal PAPI 3. Normal CO on milrinone  ECHO 01/01/2019  EF 10% with severe MR, severe TR, Biatrial enlargement, RV moderately reduced function.   Discharge Medications   Allergies as of 01/29/2019   No Known Allergies     Medication List    STOP taking these medications   aspirin 81 MG chewable tablet   carvedilol 12.5 MG tablet Commonly known as:  COREG   carvedilol 3.125 MG tablet Commonly known as:  COREG   esomeprazole 20 MG capsule Commonly known as:  NEXIUM   JARDIANCE 10 MG Tabs tablet Generic drug:  empagliflozin   omeprazole 20 MG capsule Commonly known as:  PRILOSEC Replaced by:  pantoprazole 40 MG tablet   warfarin 5 MG tablet Commonly known as:  COUMADIN     TAKE these medications   acetaminophen 325 MG tablet Commonly known as:  TYLENOL Take 2 tablets (650 mg total) by mouth every 4 (four) hours as needed for headache or mild pain.   amiodarone 200 MG tablet Commonly known as:  PACERONE Take 200 mg by mouth daily.   benzonatate 100 MG capsule Commonly known as:  TESSALON Take 1 capsule (100 mg total) by mouth every 8 (eight) hours.   DULERA 200-5 MCG/ACT Aero Generic drug:  mometasone-formoterol Inhale 2 puffs into the lungs 2 (two) times daily.   lactobacillus Pack Take 1 packet (1 g total) by mouth 3 (three) times daily with  meals.   milrinone 20 MG/100 ML Soln infusion Commonly known as:  PRIMACOR Inject 0.0205 mg/min into the vein continuous.   multivitamin with minerals Tabs tablet Take 1 tablet by mouth daily.   pantoprazole 40 MG tablet Commonly known as:  PROTONIX Take 1 tablet (40 mg total) by mouth daily. Replaces:  omeprazole 20 MG capsule   polyethylene glycol packet Commonly known as:  MIRALAX / GLYCOLAX Take 17 g by mouth daily.   potassium chloride SA 20 MEQ tablet Commonly known as:  K-DUR,KLOR-CON Take 2 tablets (40 mEq total) by mouth daily. Start taking on:  January 30, 2019 What changed:    medication strength  how much to take  when to take this   spironolactone 25 MG tablet Commonly known as:  ALDACTONE Take 0.5 tablets (12.5 mg total) by mouth at bedtime.   sucralfate 1 g tablet Commonly known as:  CARAFATE Take 1 tablet (1 g total) by mouth 4 (four) times daily -  with meals and at bedtime.   tamsulosin 0.4 MG Caps capsule Commonly known as:  FLOMAX  Take 2 capsules (0.8 mg total) by mouth daily after supper. What changed:    how much to take  when to take this   torsemide 20 MG tablet Commonly known as:  DEMADEX Take 2 tablets (40 mg total) by mouth 2 (two) times daily. What changed:    how much to take  how to take this  when to take this  additional instructions  Another medication with the same name was removed. Continue taking this medication, and follow the directions you see here.            Durable Medical Equipment  (From admission, onward)         Start     Ordered   01/23/19 1037  Heart failure home health orders  (Heart failure home health orders / Face to face)  Once    Comments:  Heart Failure Follow-up Care:  Verify follow-up appointments per Patient Discharge Instructions. Confirm transportation arranged. Reconcile home medications with discharge medication list. Remove discontinued medications from use. Assist  patient/caregiver to manage medications using pill box. Reinforce low sodium food selection Assessments: Vital signs and oxygen saturation at each visit. Assess home environment for safety concerns, caregiver support and availability of low-sodium foods. Consult Education officer, museum, PT/OT, Dietitian, and CNA based on assessments. Perform comprehensive cardiopulmonary assessment. Notify MD for any change in condition or weight gain of 3 pounds in one day or 5 pounds in one week with symptoms. Daily Weights and Symptom Monitoring: Ensure patient has access to scales. Teach patient/caregiver to weigh daily before breakfast and after voiding using same scale and record.    Teach patient/caregiver to track weight and symptoms and when to notify Provider. Activity: Develop individualized activity plan with patient/caregiver.  [facility] to provide  Labs every other week to include BMET, Mg, and CBC with Diff. Additional as needed. Should be drawn via PERIPHERAL stick. NOT PICC line.   L8921 Milrinone 0.25 mcg/kg/min X 52 weeks A4221 Supplies for maintenance of drug infusion catheter A4222 Supplies for the external drug infusion per cassette or bag E0781 Ambulatory Infusion pump  Question Answer Comment  Heart Failure Follow-up Care Advanced Heart Failure (AHF) Clinic at [phone]   Obtain the following labs Basic Metabolic Panel   Obtain the following labs Other see comments   Lab frequency Other see comments   Fax lab results to AHF Clinic at [phone]   Diet Low Sodium Heart Healthy   Fluid restrictions: 2000 mL Fluid      01/23/19 1037          Disposition   The patient will be discharged in stable condition to SNF.  Discharge Instructions    (HEART FAILURE PATIENTS) Call MD:  Anytime you have any of the following symptoms: 1) 3 pound weight gain in 24 hours or 5 pounds in 1 week 2) shortness of breath, with or without a dry hacking cough 3) swelling in the hands, feet or stomach 4)  if you have to sleep on extra pillows at night in order to breathe.   Complete by:  As directed    Diet - low sodium heart healthy   Complete by:  As directed    Increase activity slowly   Complete by:  As directed    STOP any activity that causes chest pain, shortness of breath, dizziness, sweating, or exessive weakness   Complete by:  As directed       Contact information for follow-up providers    [place]  [facility]. Go on 02/04/2019.   Specialty:  Cardiology Why:  at 10 AM in the [place] Failure clinic.  Please bring all medications to appt.  gate code is 0227 for February. Contact information: [address] [id number] [place] [phone]           Contact information for after-discharge care    Destination    [facility] Preferred SNF .   Service:  Skilled Nursing Contact information: [address] [place] [phone]                    Duration of Discharge Encounter: Greater than 35 minutes   Signed, Annamaria Helling 01/29/2019, 9:09 AM   Patient seen and examined with the above-signed Advanced Practice Provider and/or Housestaff. I personally reviewed laboratory data, imaging studies and relevant notes. I independently examined the patient and formulated the important aspects of the plan. I have edited the note to reflect any of my changes or salient points. I have personally discussed the plan with the patient and/or family.  He is stable today on IV milrinone. Co-ox and volume status ok. Will plan d/c today to SNF. Will re-evaluate in next few weeks for VAD candidacy.   Glori Bickers, MD  11:07 AM

## 2019-01-23 NOTE — Progress Notes (Signed)
Patient ID: Spencer [name], male   DOB: [birth year], 78 y.o.   MRN: [id number]  This NP visited patient at the bedside as a follow up for palliative medicine needs and emotional support.  Patient is out of bed to the chair and dozing.  He arouses easily, and verbalizes no complaints.  He tells me that the plan is to discharge to SNF with Milrinone pump.  LVAD may be considered in the future. PICC line will need to be placed for medication.  Discussed with patient the importance of continued conversation with his family and his  medical providers regarding overall plan of care and treatment options,  ensuring decisions are within the context of the patients values and GOCs.  Questions and concerns addressed    Total time spent on the unit was 15 minutes  Greater than 50% of the time was spent in counseling and coordination of care  Wadie Lessen NP  Palliative Medicine Team Team Phone # [phone] Pager [phone]

## 2019-01-23 NOTE — Progress Notes (Signed)
Patient ID: Spencer [name], male   DOB: 1941-09-28, 78 y.o.   MRN: [id number]      Patient Status: [facility] - In-pt  Assessment and Plan: Patient in need of venous access.  Plan is for tunneled PICC today for home milrinone therapy secondary to CHF/NICM.  Details/risks of procedure incl but not limited to internal bleeding, infection, injury to adjacent structures d/w pt with his understanding and consent.   ______________________________________________________________________   History of Present Illness: Spencer [name] is a 78 y.o. male with hx CHF/NICM who is in need of tunneled central venous access prior to discharge for home milrinone treatment . Additional hx as below. Past Medical History:  Diagnosis Date  . AICD (automatic cardioverter/defibrillator) present   . Cancer of left kidney ([place])    S/P OR 05/2014  . CHF (congestive heart failure) ([place])   . CKD (chronic kidney disease)   . 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 Coronary artery disease   . DVT (deep venous thrombosis) ([facility]) 1983   LLE  . GERD (gastroesophageal reflux disease)   . History of hiatal hernia   . Hypertension    Past Surgical History:  Procedure Laterality Date  . CARDIAC CATHETERIZATION  1980's  . IMPLANTABLE CARDIOVERTER DEFIBRILLATOR IMPLANT  07/21/2010  . IR GENERIC HISTORICAL  12/23/2014   IR RADIOLOGIST EVAL & MGMT 12/23/2014 [name], MD [facility] INTERV RAD  . IR GENERIC HISTORICAL  07/20/2016   IR RADIOLOGIST EVAL & MGMT 07/20/2016 [facility] INTERV RAD  . IR RADIOLOGIST EVAL & MGMT  07/04/2017  . RENAL CRYOABLATION Left 05/2014  . RIGHT/LEFT HEART CATH AND CORONARY ANGIOGRAPHY N/A 01/07/2019   Procedure: RIGHT/LEFT HEART CATH AND CORONARY ANGIOGRAPHY;  Surgeon: Jolaine Artist, MD;  Location: [facility];  Service: Cardiovascular;  Laterality: N/A;     Allergies and medications reviewed.     Review of Systems denies fever, CP, worsening resp  difficulties,N/V  Vital Signs: BP 99/64 (BP Location: Right Arm)   Pulse 77   Temp 98.6 F (37 C) (Oral)   Resp (!) 21   Ht 6\' 1"  (1.854 m)   Wt 179 lb 10.8 oz (81.5 kg)   SpO2 99%   BMI 23.71 kg/m   Physical Exam awake/alert; chest- CTA bilat; heart- RRR, 2/6 MR; abd soft,NT,+BS; has RUE PICC   Imaging reviewed.   Labs:  COAGS: Recent Labs    01/06/19 1650  01/20/19 0619 01/21/19 0405 01/22/19 0435 01/23/19 0300  INR  --    < > 1.31 1.34 1.32 1.33  APTT 33  --   --   --   --   --    < > = values in this interval not displayed.    BMP: Recent Labs    01/20/19 0619 01/21/19 0405 01/22/19 0435 01/23/19 0300  NA 131* 129* 129* 130*  K 3.7 3.5 3.8 3.8  CL 93* 88* 89* 93*  CO2 27 30 31 26   GLUCOSE 140* 172* 149* 133*  BUN 28* 33* 35* 32*  CALCIUM 8.1* 8.2* 8.5* 8.1*  CREATININE 1.21 1.33* 1.27* 1.10  GFRNONAA 57* 51* 54* >60  GFRAA >60 59* >60 >60       Electronically Signed: D. Rowe Robert, PA-C 01/23/2019, 8:53 AM   I spent a total of 15 minutes in face to face in clinical consultation, greater than 50% of which was counseling/coordinating care for venous access.

## 2019-01-23 NOTE — Social Work (Signed)
Patient has chosen [facility]. [place] will start patient's [facility] authorization and re-train their nurse on milrinone pump. They should be able to accept patient tomorrow pending insurance auth. CSW to follow.  Estanislado Emms, LCSW [phone]

## 2019-01-23 NOTE — Progress Notes (Signed)
Pt declined walking. Gave him HF booklet and reviewed briefly. He has been following at home before admit. Will /fu tomorrow. [place], ACSM 3:15 PM 01/23/2019

## 2019-01-24 LAB — CBC
HCT: 31.5 % — ABNORMAL LOW (ref 39.0–52.0)
Hemoglobin: 10.1 g/dL — ABNORMAL LOW (ref 13.0–17.0)
MCH: 27.3 pg (ref 26.0–34.0)
MCHC: 32.1 g/dL (ref 30.0–36.0)
MCV: 85.1 fL (ref 80.0–100.0)
PLATELETS: 311 10*3/uL (ref 150–400)
RBC: 3.7 MIL/uL — ABNORMAL LOW (ref 4.22–5.81)
RDW: 20.9 % — ABNORMAL HIGH (ref 11.5–15.5)
WBC: 8.2 10*3/uL (ref 4.0–10.5)
nRBC: 0.2 % (ref 0.0–0.2)

## 2019-01-24 LAB — COOXEMETRY PANEL
Carboxyhemoglobin: 1.8 % — ABNORMAL HIGH (ref 0.5–1.5)
Carboxyhemoglobin: 1.7 % — ABNORMAL HIGH (ref 0.5–1.5)
METHEMOGLOBIN: 1.6 % — AB (ref 0.0–1.5)
Methemoglobin: 1 % (ref 0.0–1.5)
O2 SAT: 43.4 %
O2 Saturation: 46.2 %
Total hemoglobin: 13.8 g/dL (ref 12.0–16.0)
Total hemoglobin: 10.6 g/dL — ABNORMAL LOW (ref 12.0–16.0)

## 2019-01-24 LAB — BASIC METABOLIC PANEL
Anion gap: 8 (ref 5–15)
BUN: 37 mg/dL — ABNORMAL HIGH (ref 8–23)
CO2: 31 mmol/L (ref 22–32)
Calcium: 8.7 mg/dL — ABNORMAL LOW (ref 8.9–10.3)
Chloride: 90 mmol/L — ABNORMAL LOW (ref 98–111)
Creatinine, Ser: 1.14 mg/dL (ref 0.61–1.24)
GFR calc Af Amer: >60 mL/min (ref >60)
GFR calc non Af Amer: >60 mL/min (ref >60)
Glucose, Bld: 119 mg/dL — ABNORMAL HIGH (ref 70–99)
Potassium: 4.9 mmol/L (ref 3.5–5.1)
SODIUM: 129 mmol/L — AB (ref 135–145)

## 2019-01-24 LAB — MAGNESIUM: Magnesium: 2.3 mg/dL (ref 1.7–2.4)

## 2019-01-24 MED ORDER — TORSEMIDE 20 MG PO TABS: 30.0000 mg | ORAL_TABLET | Freq: Every day | ORAL | Status: DC

## 2019-01-24 MED ORDER — FUROSEMIDE 10 MG/ML IJ SOLN
80.0000 mg | Freq: Once | INTRAMUSCULAR | Status: AC
  Administered 2019-01-24: 80 mg via INTRAVENOUS
  Filled 2019-01-24: qty 8

## 2019-01-24 NOTE — Progress Notes (Addendum)
Patient ID: Spencer Rice, male   DOB: [birth year], 78 y.o.   MRN: [id number]     Advanced Heart Failure Rounding Note  PCP-Cardiologist: No primary care provider on file.   Subjective:    Developed large spontaneous RP bleed on heparin 2/10. 2/11 received 2UPRBCs.   Coox 43.4% on milrinone 0.25 mcg/kg/min at 0812. Re-draw sent.   CVP ~10-11 this am. Cr 1.0 -> 1.1. K 4.9 but hemolyzed.   Denies SOB transferring in room. Was able to stand at sink and wash/shave this am without difficult. No lightheadedness or dizziness.   [facility] 01/07/19  Ost RCA lesion is 50% stenosed.  Mid RCA lesion is 40% stenosed.  Prox LAD lesion is 50% stenosed.  Ost 2nd Diag to 2nd Diag lesion is 100% stenosed. Findings: Done on milrinone 0.25 mcg/kg/min RA = 7  RV = 62/8 PA = 65/26 (41) PCW = 19 Fick cardiac output/index = 5.6/2.7 PVR = 3.9 WU FA sat = 95% PA sat = 65%, 66% PaPI = 5.6 Assessment: 1. Mild non-obstructive CAD 2. Moderate PAH with normal PAPI 3. Normal CO on milrinone  ECHO 01/01/2019  EF 10% with severe MR, severe TR, Biatrial enlargement, RV moderately reduced function.   Objective:   Weight Range: 81 kg Body mass index is 23.56 kg/m.   Vital Signs:   Temp:  [98.4 F (36.9 C)-98.6 F (37 C)] 98.5 F (36.9 C) (02/21 0314) Pulse Rate:  [76-119] 82 (02/21 0400) Resp:  [16-24] 16 (02/21 0400) BP: (93-105)/(54-76) 99/68 (02/21 0400) SpO2:  [100 %] 100 % (02/21 0400) Weight:  [81 kg] 81 kg (02/21 0314) Last BM Date: 01/23/19  Weight change: Filed Weights   01/22/19 0454 01/23/19 0500 01/24/19 0314  Weight: 80.7 kg 81.5 kg 81 kg    Intake/Output:   Intake/Output Summary (Last 24 hours) at 01/24/2019 0901 Last data filed at 01/24/2019 0400 Gross per 24 hour  Intake 73.92 ml  Output 600 ml  Net -526.08 ml    Physical Exam   General: NAD HEENT: Normal Neck: Supple. JVP ~8-9 cm. Carotids 2+ bilat; no bruits. No thyromegaly or nodule noted. Cor: PMI  nondisplaced. RRR, 2/6 MR Lungs: CTAB, normal effort. Abdomen: Soft, non-tender, non-distended, no HSM. No bruits or masses. +BS  Extremities: No cyanosis, clubbing, or rash. R subclavian tunneled PICC placed.  Neuro: Alert & orientedx3, cranial nerves grossly intact. moves all 4 extremities w/o difficulty. Affect pleasant   Telemetry   NSR with BiV pacing in 70s, personally reviewed.   Labs    CBC Recent Labs    01/23/19 0300 01/24/19 0752  WBC 8.8 8.2  HGB 9.4* 10.1*  HCT 30.7* 31.5*  MCV 85.5 85.1  PLT 294 962   Basic Metabolic Panel Recent Labs    01/22/19 0435 01/23/19 0300  NA 129* 130*  K 3.8 3.8  CL 89* 93*  CO2 31 26  GLUCOSE 149* 133*  BUN 35* 32*  CREATININE 1.27* 1.10  CALCIUM 8.5* 8.1*  MG 2.2 2.0   Liver Function Tests No results for input(s): AST, ALT, ALKPHOS, BILITOT, PROT, ALBUMIN in the last 72 hours. No results for input(s): LIPASE, AMYLASE in the last 72 hours. Cardiac Enzymes No results for input(s): CKTOTAL, CKMB, CKMBINDEX, TROPONINI in the last 72 hours.  BNP: BNP (last 3 results) Recent Labs    12/04/18 1743 12/28/18 1821 12/29/18 1943  BNP >4,500.0* >4,925.0* >4,500.0*    ProBNP (last 3 results) No results for input(s): PROBNP in the last 8760 hours.  D-Dimer No results for input(s): DDIMER in the last 72 hours. Hemoglobin A1C No results for input(s): HGBA1C in the last 72 hours. Fasting Lipid Panel No results for input(s): CHOL, HDL, LDLCALC, TRIG, CHOLHDL, LDLDIRECT in the last 72 hours. Thyroid Function Tests No results for input(s): TSH, T4TOTAL, T3FREE, THYROIDAB in the last 72 hours.  Invalid input(s): FREET3  Other results:   Imaging    Ir Fluoro Guide Cv Line Right  Result Date: 01/23/2019 CLINICAL DATA:  CHF with need of durable venous access for IV therapy EXAM: TUNNELED CENTRAL VENOUS CATHETER PLACEMENT WITH ULTRASOUND AND FLUOROSCOPIC GUIDANCE TECHNIQUE: The procedure, risks, benefits, and  alternatives were explained to the patient. Questions regarding the procedure were encouraged and answered. The patient understands and consents to the procedure. Patency of the right IJ vein was confirmed with ultrasound with image documentation. An appropriate skin site was determined. Region was prepped using maximum barrier technique including cap and mask, sterile gown, sterile gloves, large sterile sheet, and Chlorhexidine as cutaneous antisepsis. The region was infiltrated locally with 1% lidocaine. Under real-time ultrasound guidance, the right IJ vein was accessed with a 21 gauge micropuncture needle; the needle tip within the vein was confirmed with ultrasound image documentation. 669F single-lumen cuffed powerPICC tunneled from a right anterior chest wall approach to the dermatotomy site. Needle exchanged over the 018 guidewire for transitional dilator, through which the catheter which had been cut to 27 cm was advanced under intermittent fluoroscopy, positioned with its tip at the cavoatrial junction. Spot chest radiograph confirms good catheter position. No pneumothorax. Catheter was flushed per protocol. Catheter secured externally with O Prolene suture. The right IJ dermatotomy site was closed with Dermabond. COMPLICATIONS: COMPLICATIONS None immediate FLUOROSCOPY TIME:  12 seconds; 1 mGy COMPARISON:  None IMPRESSION: 1. Technically successful placement of tunneled right IJ tunneled single-lumen power injectable catheter with ultrasound and fluoroscopic guidance. Ready for routine use. Electronically Signed   By: Lucrezia Europe M.D.   On: 01/23/2019 13:24   Ir US Guide Vasc Access Right  Result Date: 01/23/2019 CLINICAL DATA:  CHF with need of durable venous access for IV therapy EXAM: TUNNELED CENTRAL VENOUS CATHETER PLACEMENT WITH ULTRASOUND AND FLUOROSCOPIC GUIDANCE TECHNIQUE: The procedure, risks, benefits, and alternatives were explained to the patient. Questions regarding the procedure were  encouraged and answered. The patient understands and consents to the procedure. Patency of the right IJ vein was confirmed with ultrasound with image documentation. An appropriate skin site was determined. Region was prepped using maximum barrier technique including cap and mask, sterile gown, sterile gloves, large sterile sheet, and Chlorhexidine as cutaneous antisepsis. The region was infiltrated locally with 1% lidocaine. Under real-time ultrasound guidance, the right IJ vein was accessed with a 21 gauge micropuncture needle; the needle tip within the vein was confirmed with ultrasound image documentation. 669F single-lumen cuffed powerPICC tunneled from a right anterior chest wall approach to the dermatotomy site. Needle exchanged over the 018 guidewire for transitional dilator, through which the catheter which had been cut to 27 cm was advanced under intermittent fluoroscopy, positioned with its tip at the cavoatrial junction. Spot chest radiograph confirms good catheter position. No pneumothorax. Catheter was flushed per protocol. Catheter secured externally with O Prolene suture. The right IJ dermatotomy site was closed with Dermabond. COMPLICATIONS: COMPLICATIONS None immediate FLUOROSCOPY TIME:  12 seconds; 1 mGy COMPARISON:  None IMPRESSION: 1. Technically successful placement of tunneled right IJ tunneled single-lumen power injectable catheter with ultrasound and fluoroscopic guidance. Ready for routine use.  Electronically Signed   By: Lucrezia Europe M.D.   On: 01/23/2019 13:24     Medications:     Scheduled Medications: . ALPRAZolam  0.25 mg Oral Once  . amiodarone  200 mg Oral Daily  . Chlorhexidine Gluconate Cloth  6 each Topical Daily  . enoxaparin (LOVENOX) injection  40 mg Subcutaneous Q24H  . feeding supplement  1 Container Oral BID BM  . lactobacillus  1 g Oral TID WC  . mouth rinse  15 mL Mouth Rinse BID  . mometasone-formoterol  2 puff Inhalation BID  . multivitamin with minerals  1  tablet Oral Daily  . pantoprazole  40 mg Oral Daily  . polyethylene glycol  17 g Oral Daily  . senna-docusate  1 tablet Oral BID  . sodium chloride flush  10-40 mL Intracatheter Q12H  . sodium chloride flush  3 mL Intravenous Once  . sodium chloride flush  3 mL Intravenous Q12H  . sodium chloride flush  3 mL Intravenous Q12H  . spironolactone  12.5 mg Oral QHS  . tamsulosin  0.8 mg Oral QPC supper    Infusions: . sodium chloride 5 mL/hr at 01/22/19 0300  . 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    Admitted with marked volume overload and cardiogenic shock .   Assessment/Plan   1. A/C Systolic Heart Failure -> cardiogenic shock: Due to primarily to nonischemic cardiomyopathy (out of proportion to CAD). [place] 2015 with moderate disease. Has Biotronik BiV ICD.  Initial CO-OX 38% so milrinone started on 1/28. ECHO 01/01/19 EF 10% with severe MR/TR, biatrial enlargement, RV with moderately reduced function.  - Coox 43.4% this am on milrinone 0.25 mcg/kg/min. Repeat pending.  - CVP ~10 cm this am. Discussed with Dr. Haroldine Laws. Will give 80 mg IV lasix this am and follow.  - Discussed at [facility] on 2/10 and would be possible candidate once RP bleed improves. Likely will need to go Rehab for several weeks to recover then reassess. Not candidate for CIR. He has met criteria for SNF.  - Continue UNNA boots.  2. RP bleed with symptomatic anemia - Spontaneous while on heparin (? Traumatic component). Was on heparin for  h/o LV clot (no longer present) - Has some tenderness in left groin due to extension of hematoma seen on CT. This is improved - Received 2U PRBCs on 2/10  - Hgb pending this am.  - Continue lovenox 40 daily for DVT prophylaxis. No AC on discharge.   3. CKD Stage III, Previously perceived creatinine baseline 1.8-2.4.   - Cr pending this am.   4. COPD on chronic home oxygen: PFTs 01/10/19 with restriction and obstruction. DLCO 53% (14.64) - No change to current plan.    5. L Kidney Cancer: Had cryoablation in 2015. Stable.   6. PVCs: Started on amiodarone 2019 to suppress PVCs.  - Continue amiodarone 200 mg daily. No chang.e   7. 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  - No change to current plan.    8. UTI, Klebsiella pneumoniae  - Completed rocephin  course. No change.   9. Pulmonary nodule: 6 mm nodule in LUL noted on CT scan.  - Recommended 6 month repeat non contrast CT. Dr Haroldine Laws discussed with Dr Prescott Gum and okay to move forward with VAD workup. No change.   10. H/O [place] Thrombus - Off coumadin due to RP hematoma. Now on prophylactic Lovenox.  - Will keep off AC with RP hematoma.    11. Deconditioning - Turned down for CIR.  - Approved for SNF. Awaiting insurance.   Awaiting insurance approval for SNF. Repeat coox pending.   ADDENDUM:  Coox 46%. CVP trending up. Will restart IV lasix and hold. May need to discuss VAD sooner, if determined to be candidate.   Length of Stay: [place], [place]  9:01 AM  Advanced Heart Failure Team Pager [phone] (M-F; 7a - 4p)  Please contact [facility] for night-coverage after hours (4p -7a ) and weekends on [email or web address]

## 2019-01-24 NOTE — Social Work (Signed)
Pt insurance has denied, peer to peer can be completed by care team if they are willing.  [phone] Team should leave contact name and number for return call by insurance MD This must be done by Monday at [place] has contacted heart failure team with this information, they are aware and pending medical stability will complete peer to peer Monday.  Westley Hummer, MSW, [place] Work [phone]

## 2019-01-24 NOTE — Social Work (Signed)
Insurance requesting new PT evaluation. Pt has not been re-evaluated since 2/21.  Called RNCM and she will assist with re-ordering PT consult.  Westley Hummer, MSW, [place] Work [phone]

## 2019-01-24 NOTE — Progress Notes (Signed)
Nutrition Follow-up  DOCUMENTATION CODES:   Non-severe (moderate) malnutrition in context of chronic illness  INTERVENTION:   -Continue Boost Breeze po BID, each supplement provides 250 kcal and 9 grams of protein -Continue MVI with minerals daily -Continue double protein portions with meals -Continue HS snack  NUTRITION DIAGNOSIS:   Moderate Malnutrition related to chronic illness(CHF, COPD, CAD) as evidenced by moderate muscle depletion, moderate fat depletion.  Ongoing  GOAL:   Patient will meet greater than or equal to 90% of their needs  Progressing  MONITOR:   PO intake, Supplement acceptance, Skin  REASON FOR ASSESSMENT:   Consult LVAD Eval  ASSESSMENT:   78 yo male with PMH of CAD, COPD, DM2, CHF, AICD, HTN, GERD who was admitted with SOB r/t CHF exacerbation.  2/10 - transferred to ICU for large retroperitoneal bleed 2/13- transferred from ICU to PCU  Reviewed I/O's: -676 ml x 24 hours and -5.4 L since 01/10/19  Case discussed with RN, who reports pt is eating well, but often does not like the food provided. Plan to d/c to SNF early next week, secondary to labs.   Spoke with pt, who was sitting in recliner chair at time of visit. Pt with flat affect, but opened up to RD more as visit progressed. Pt shares that he consumed all of his breakfast this morning, however, did not like the chicken sausage. He has been eating most of the food provided to him. Noted meal completion 100%. Pt also enjoys the Boost Breeze supplements- RD assisted pt with finishing rest of supplement during visit. Reinforced importance of good meal and supplement intake to promote healing.   Labs reviewed: Na: 129.   Diet Order:   Diet Order            Diet Heart Room service appropriate? Yes; Fluid consistency: Thin  Diet effective now              EDUCATION NEEDS:   Education needs have been addressed  Skin:  Skin Assessment: Reviewed RN Assessment  Last BM:   01/23/19  Height:   Ht Readings from Last 1 Encounters:  01/17/19 6\' 1"  (1.854 m)    Weight:   Wt Readings from Last 1 Encounters:  01/24/19 81 kg    Ideal Body Weight:  85 kg  BMI:  Body mass index is 23.56 kg/m.  Estimated Nutritional Needs:   Kcal:  2100-2400  Protein:  115-130 gm  Fluid:  2 L    Spencer Rice A. Jimmye Norman, RD, LDN, CDE Pager: [phone] After hours Pager: [phone]

## 2019-01-24 NOTE — Plan of Care (Signed)
  Problem: Education: Goal: Individualized Educational Video(s) Outcome: Progressing   Problem: Activity: Goal: Capacity to carry out activities will improve Outcome: Progressing   Problem: Cardiac: Goal: Ability to achieve and maintain adequate cardiopulmonary perfusion will improve Outcome: Progressing   Problem: Education: Goal: Ability to demonstrate management of disease process will improve Outcome: Progressing Goal: Ability to verbalize understanding of medication therapies will improve Outcome: Progressing   Problem: Activity: Goal: Capacity to carry out activities will improve Outcome: Progressing   Problem: Cardiac: Goal: Ability to achieve and maintain adequate cardiopulmonary perfusion will improve Outcome: Progressing   Problem: Health Behavior/Discharge Planning: Goal: Ability to manage health-related needs will improve Outcome: Progressing   Problem: Clinical Measurements: Goal: Ability to maintain clinical measurements within normal limits will improve Outcome: Progressing Goal: Will remain free from infection Outcome: Progressing Goal: Diagnostic test results will improve Outcome: Progressing Goal: Respiratory complications will improve Outcome: Progressing Goal: Cardiovascular complication will be avoided Outcome: Progressing   Problem: Activity: Goal: Risk for activity intolerance will decrease Outcome: Progressing

## 2019-01-24 NOTE — Progress Notes (Signed)
All lab work redrawn at this time and sent to lab and respiratory. Will continue to monitor for results.

## 2019-01-24 NOTE — Progress Notes (Signed)
CARDIAC REHAB PHASE I   PRE:  Rate/Rhythm: 83 pacing    BP: sitting 98/67    SaO2: 100 2L  MODE:  Ambulation: 280 ft   POST:  Rate/Rhythm: 88 pacing    BP: sitting 92/68     SaO2: 100 2L  Tolerated well. Sts he feels about the same as he has been feeling walking. No c/o. Return to recliner. [place], ACSM 01/24/2019 3:19 PM

## 2019-01-24 NOTE — Care Management Note (Signed)
Case Management Note Previous Note Created by Midge Minium  Patient Details  Name: Spencer Rice MRN: [id number] Date of Birth: 1941/07/17  Subjective/Objective:  78 yo male presented with SOB. 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 PCP: Dr. Rita Ohara; pharmacy of choice: [facility] [place]. Patient and his sons has decided to proceed with VAD w/u, with  LVAD evaluation in process . CM will continue following for dispositional needs.   Expected Discharge Date:                  Expected Discharge Plan:  [place]  In-House Referral:  Clinical Social Work  Discharge planning Services  CM Consult  Post Acute Care Choice:  Home Health, Resumption of Svcs/PTA Provider Choice offered to:  Patient  DME Arranged:  N/A DME Agency:  NA  HH Arranged:  RN, Disease Management, PT [place] Agency:  [facility]  Status of Service:  In process, will continue to follow  If discussed at Long Length of Stay Meetings, dates discussed:    Additional Comments: 01/23/2019 Pt is now being worked up for SNF.  If SNF is denied by insurance ; [facility] declined to take pt.  Pts chose [facility] as second choice - agency will accept pt for [facility] if needed.  [facility] following for IV infusion of inotrope.    01/16/19 @ 1645-Natalie Gay RNCM-CM consult acknowledged for [facility] needs. Patient is open to [facility] for [facility] PTA with HHPT recommended and will be added. DME will be arranged prior to patient transitioning home. LVAD work-up continues with CM to continue following for dispositional needs.   Midge Minium RN, BSN, NCM-BC, ACM-RN [phone] 01/24/2019, 3:10 PM

## 2019-01-25 LAB — CBC
HCT: 30.9 % — ABNORMAL LOW (ref 39.0–52.0)
Hemoglobin: 9.6 g/dL — ABNORMAL LOW (ref 13.0–17.0)
MCH: 26.6 pg (ref 26.0–34.0)
MCHC: 31.1 g/dL (ref 30.0–36.0)
MCV: 85.6 fL (ref 80.0–100.0)
Platelets: 280 10*3/uL (ref 150–400)
RBC: 3.61 MIL/uL — ABNORMAL LOW (ref 4.22–5.81)
RDW: 20.8 % — ABNORMAL HIGH (ref 11.5–15.5)
WBC: 7.9 10*3/uL (ref 4.0–10.5)
nRBC: 0 % (ref 0.0–0.2)

## 2019-01-25 LAB — BASIC METABOLIC PANEL
Anion gap: 11 (ref 5–15)
BUN: 34 mg/dL — ABNORMAL HIGH (ref 8–23)
CO2: 28 mmol/L (ref 22–32)
Calcium: 8.6 mg/dL — ABNORMAL LOW (ref 8.9–10.3)
Chloride: 90 mmol/L — ABNORMAL LOW (ref 98–111)
Creatinine, Ser: 1.17 mg/dL (ref 0.61–1.24)
GFR calc Af Amer: >60 mL/min (ref >60)
GFR calc non Af Amer: 60 mL/min — ABNORMAL LOW (ref >60)
GLUCOSE: 144 mg/dL — AB (ref 70–99)
Potassium: 4 mmol/L (ref 3.5–5.1)
Sodium: 129 mmol/L — ABNORMAL LOW (ref 135–145)

## 2019-01-25 LAB — MAGNESIUM: Magnesium: 2.3 mg/dL (ref 1.7–2.4)

## 2019-01-25 LAB — COOXEMETRY PANEL
Carboxyhemoglobin: 2.2 % — ABNORMAL HIGH (ref 0.5–1.5)
Methemoglobin: 1 % (ref 0.0–1.5)
O2 Saturation: 57.9 %
Total hemoglobin: 10.9 g/dL — ABNORMAL LOW (ref 12.0–16.0)

## 2019-01-25 NOTE — Plan of Care (Signed)
Pt  continues care, stable.

## 2019-01-25 NOTE — Progress Notes (Signed)
Patient's labs drawn from central line using inline set  and sent to lab and co ox drawn and sent to respiratory.

## 2019-01-25 NOTE — Progress Notes (Signed)
Patient ID: Spencer Rice Rice, male   DOB: May 14, 1941, 78 y.o.   MRN: [id number]     Advanced Heart Failure Rounding Note  PCP-Cardiologist: Spencer Rice Rice.   Subjective:    Developed large spontaneous RP bleed on heparin 2/10. 2/11 received 2UPRBCs.   Transfer to SNF held yesterday due to falling co-ox at 43.4% despite milrinone 0.25 mcg/kg/min. Also started back on IV lasix for increasing CVP.   Feels better today. Less fatigued. Spencer CP or SOB. Weight stable on IV lasix. Co-ox back up to 57.9% on IV milrinone 0.25.  CVP 9-10   Spencer Rice Rice 01/07/19  Ost RCA lesion is 50% stenosed.  Mid RCA lesion is 40% stenosed.  Prox LAD lesion is 50% stenosed.  Ost 2nd Diag to 2nd Diag lesion is 100% stenosed. Findings: Done on milrinone 0.25 mcg/kg/min RA = 7  RV = 62/8 PA = 65/26 (41) PCW = 19 Fick cardiac output/index = 5.6/2.7 PVR = 3.9 WU FA sat = 95% PA sat = 65%, 66% PaPI = 5.6 Assessment: 1. Mild non-obstructive CAD 2. Moderate PAH with normal PAPI 3. Normal CO on milrinone  ECHO 01/01/2019  EF 10% with severe Spencer Rice, severe TR, Biatrial enlargement, RV moderately reduced function.   Objective:   Weight Range: 81.1 kg Body mass index is 23.59 kg/m.   Vital Signs:   Temp:  [97.6 F (36.4 C)-98.1 F (36.7 C)] 97.8 F (36.6 C) (02/22 1030) Pulse Rate:  [71-87] 86 (02/22 1253) Resp:  [15-21] 19 (02/22 0200) BP: (95-106)/(60-74) 104/68 (02/22 1030) SpO2:  [93 %-100 %] 100 % (02/22 1253) Weight:  [81.1 kg] 81.1 kg (02/22 0500) Last BM Date: 01/25/19  Weight change: Filed Weights   01/23/19 0500 01/24/19 0314 01/25/19 0500  Weight: 81.5 kg 81 kg 81.1 kg    Intake/Output:   Intake/Output Summary (Last 24 hours) at 01/25/2019 1436 Last data filed at 01/25/2019 1100 Gross per 24 hour  Intake 61.52 ml  Output 1150 ml  Net -1088.48 ml    Physical Exam   General:  Sitting in chair. Spencer resp difficulty HEENT: normal Neck: supple. JVP 9-10. Carotids 2+  bilat; Spencer bruits. Spencer lymphadenopathy or thryomegaly appreciated. Cor: PMI laterally displaced. Regular rate & rhythm. 2/6 Spencer Rice/TR  R chest tunneled PICC Lungs: clear Abdomen: soft, nontender, nondistended. Spencer hepatosplenomegaly. Spencer bruits or masses. Good bowel sounds. Extremities: Spencer cyanosis, clubbing, rash, tr edema + unna boots  Neuro: alert & orientedx3, cranial nerves grossly intact. moves all 4 extremities w/o difficulty. Affect pleasant   Telemetry   NSR with BiV pacing in 70-80s, personally reviewed.   Labs    CBC Recent Labs    01/24/19 0752 01/25/19 0400  WBC 8.2 7.9  HGB 10.1* 9.6*  HCT 31.5* 30.9*  MCV 85.1 85.6  PLT 311 681   Basic Metabolic Panel Recent Labs    01/24/19 0752 01/25/19 0400  NA 129* 129*  K 4.9 4.0  CL 90* 90*  CO2 31 28  GLUCOSE 119* 144*  BUN 37* 34*  CREATININE 1.14 1.17  CALCIUM 8.7* 8.6*  MG 2.3 2.3   Liver Function Tests Spencer results for input(s): AST, ALT, ALKPHOS, BILITOT, PROT, ALBUMIN in the last 72 hours. Spencer results for input(s): LIPASE, AMYLASE in the last 72 hours. Cardiac Enzymes Spencer results for input(s): CKTOTAL, CKMB, CKMBINDEX, TROPONINI in the last 72 hours.  BNP: BNP (last 3 results) Recent Labs    12/04/18 1743 12/28/18 1821 12/29/18 1943  BNP >4,500.0* >4,925.0* >  4,500.0*    ProBNP (last 3 results) Spencer results for input(s): PROBNP in the last 8760 hours.   D-Dimer Spencer results for input(s): DDIMER in the last 72 hours. Hemoglobin A1C Spencer results for input(s): HGBA1C in the last 72 hours. Fasting Lipid Panel Spencer results for input(s): CHOL, HDL, LDLCALC, TRIG, CHOLHDL, LDLDIRECT in the last 72 hours. Thyroid Function Tests Spencer results for input(s): TSH, T4TOTAL, T3FREE, THYROIDAB in the last 72 hours.  Invalid input(s): FREET3  Other results:   Imaging    Spencer results found.   Medications:     Scheduled Medications: . ALPRAZolam  0.25 mg Oral Once  . amiodarone  200 mg Oral Daily  . Chlorhexidine  Gluconate Cloth  6 each Topical Daily  . enoxaparin (LOVENOX) injection  40 mg Subcutaneous Q24H  . feeding supplement  1 Container Oral BID BM  . lactobacillus  1 g Oral TID WC  . mouth rinse  15 mL Mouth Rinse BID  . mometasone-formoterol  2 puff Inhalation BID  . multivitamin with minerals  1 tablet Oral Daily  . pantoprazole  40 mg Oral Daily  . polyethylene glycol  17 g Oral Daily  . senna-docusate  1 tablet Oral BID  . sodium chloride flush  10-40 mL Intracatheter Q12H  . sodium chloride flush  3 mL Intravenous Once  . sodium chloride flush  3 mL Intravenous Q12H  . sodium chloride flush  3 mL Intravenous Q12H  . spironolactone  12.5 mg Oral QHS  . tamsulosin  0.8 mg Oral QPC supper    Infusions: . sodium chloride 6.2 mL/hr at 01/24/19 0805  . 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    Admitted with marked volume overload and cardiogenic shock .   Assessment/Plan   1. A/C Systolic Heart Failure -> cardiogenic shock: Due to primarily to nonischemic cardiomyopathy (out of proportion to CAD). Spencer Rice Rice 2015 with moderate disease. Has Biotronik BiV ICD.  Initial CO-OX 38% so milrinone started on 1/28. ECHO 01/01/19 EF 10% with severe Spencer Rice/TR, biatrial enlargement, RV with moderately reduced function.  - Coox back up to 57.9% this am on milrinone 0.25 mcg/kg/min..  - CVP ~9-10 cm this am. Spencer Rice Rice repeat IV lasix today - Discussed at VAD MRB meeting on 2/10 and would be possible candidate once RP bleed improves. Likely will  need to go Rehab for several weeks to recover then reassess. Not candidate for CIR. He has met criteria for SNF which is in place until Monday. If co-ox stable will plan d/c to [facility] If not, will need to discuss earlier VAD timeline. - Continue UNNA boots.  2. RP bleed with symptomatic anemia - Spontaneous while on heparin (? Traumatic component). Was on heparin for h/o LV clot (Spencer longer present) - Has some tenderness in left groin due to extension of hematoma seen on CT. This is improved - Received 2U PRBCs on 2/10  - Hgb stable at 9.6  - Continue lovenox 40 daily for DVT prophylaxis. Spencer AC on discharge.   3. CKD Stage III, Previously perceived creatinine baseline 1.8-2.4.   -  Cr 1.17 this am   4. COPD on chronic home oxygen: PFTs 01/10/19 with restriction and obstruction. DLCO 53% (14.64) - Spencer change to current plan.    5. L Kidney Cancer: Had cryoablation in 2015. Stable.   6. PVCs: Started on amiodarone 2019 to suppress PVCs.  - Continue amiodarone 200 mg daily. Spencer chang.e   7. Mitral Regurgitation/Tricuspid Regurgitation: Severe Spencer Rice/TR on ECHO 01/01/19.  - Spencer change to current plan.    8. UTI, Klebsiella pneumoniae  - Completed rocephin course. Spencer change.   9. Pulmonary nodule: 6 mm nodule in LUL noted on CT scan.  - Recommended 6 month repeat non contrast CT. Dr Haroldine Laws discussed with Dr Prescott Gum and okay to move forward with VAD workup. Spencer change.   10. H/O Mural Thrombus - Off coumadin due to RP hematoma. Now on prophylactic Lovenox.  - Will keep off AC with RP hematoma.    11. Deconditioning - Turned down for CIR.  - Approved for SNF.  Length of Stay: 74  Glori Bickers, MD  2:36 PM  Advanced Heart Failure Team Pager [phone] (M-F; 7a - 4p)  Please contact [facility] for night-coverage after hours (4p -7a ) and weekends on [email or web address]  Patient seen and examined with the above-signed Advanced Practice Provider and/or Housestaff. I personally reviewed  laboratory data, imaging studies and relevant notes. I independently examined the patient and formulated the important aspects of the plan. I have edited the note to reflect any of my changes or salient points. I have personally discussed the plan with the patient and/or family.  Tunneled PICC placed yesterday in preparation for possible d/c to SNF today. However CVp back up and co-ox back down despite milrinone. Now back in shock range. Will need to keep in house and monitor. Switch back to IV lasix. If co-ox comes abck up can go to SNF early next week. If not, May need to decide on VAD sooner rather than later.   Glori Bickers, MD  2:36 PM

## 2019-01-25 NOTE — Progress Notes (Signed)
CARDIAC REHAB PHASE I   PRE:  Rate/Rhythm: 84 paced  BP:  Supine:   Sitting: 98/69  Standing:    SaO2: 100% 2L  MODE:  Ambulation: 280 ft   POST:  Rate/Rhythm: 97 paced  BP:  Supine:   Sitting: 107/68  Standing:    SaO2: 99% 2L 1430-1500 Pt walked 280 ft on 2L with walker and asst x 1. Tolerated well. Back to recliner after walk. Left on 2L.   Graylon Good, RN BSN  01/25/2019 2:55 PM

## 2019-01-26 LAB — COOXEMETRY PANEL
Carboxyhemoglobin: 1.7 % — ABNORMAL HIGH (ref 0.5–1.5)
Carboxyhemoglobin: 1.8 % — ABNORMAL HIGH (ref 0.5–1.5)
Methemoglobin: 1.3 % (ref 0.0–1.5)
Methemoglobin: 1.5 % (ref 0.0–1.5)
O2 SAT: 36.7 %
O2 Saturation: 54.8 %
TOTAL HEMOGLOBIN: 11 g/dL — AB (ref 12.0–16.0)
Total hemoglobin: 12 g/dL (ref 12.0–16.0)

## 2019-01-26 LAB — BASIC METABOLIC PANEL
Anion gap: 11 (ref 5–15)
BUN: 37 mg/dL — AB (ref 8–23)
CO2: 27 mmol/L (ref 22–32)
CREATININE: 1.35 mg/dL — AB (ref 0.61–1.24)
Calcium: 8.8 mg/dL — ABNORMAL LOW (ref 8.9–10.3)
Chloride: 93 mmol/L — ABNORMAL LOW (ref 98–111)
GFR calc Af Amer: 58 mL/min — ABNORMAL LOW (ref >60)
GFR calc non Af Amer: 50 mL/min — ABNORMAL LOW (ref >60)
Glucose, Bld: 149 mg/dL — ABNORMAL HIGH (ref 70–99)
Potassium: 4.2 mmol/L (ref 3.5–5.1)
Sodium: 131 mmol/L — ABNORMAL LOW (ref 135–145)

## 2019-01-26 LAB — CBC
HCT: 31.7 % — ABNORMAL LOW (ref 39.0–52.0)
Hemoglobin: 10.1 g/dL — ABNORMAL LOW (ref 13.0–17.0)
MCH: 27.3 pg (ref 26.0–34.0)
MCHC: 31.9 g/dL (ref 30.0–36.0)
MCV: 85.7 fL (ref 80.0–100.0)
Platelets: 298 10*3/uL (ref 150–400)
RBC: 3.7 MIL/uL — ABNORMAL LOW (ref 4.22–5.81)
RDW: 21 % — ABNORMAL HIGH (ref 11.5–15.5)
WBC: 8.6 10*3/uL (ref 4.0–10.5)
nRBC: 0 % (ref 0.0–0.2)

## 2019-01-26 LAB — MAGNESIUM: MAGNESIUM: 2.2 mg/dL (ref 1.7–2.4)

## 2019-01-26 NOTE — Progress Notes (Signed)
Patient ID: Spencer Rice, male   DOB: 1941/03/18, 78 y.o.   MRN: [id number]     Advanced Heart Failure Rounding Note  PCP-Cardiologist: No primary care provider on file.   Subjective:    Developed large spontaneous RP bleed on heparin 2/10. 2/11 received 2UPRBCs.   Transfer to SNF held on 2/21 due to falling co-ox at 43.4% despite milrinone 0.25 mcg/kg/min. Co-ox back up to 57.9% yesterday  Remains on milrinone. Feels that chest is heavy today. Co-ox down to 37%. Has diuresed well on IV lasix. CVP 7-8 this am. No orthopnea or PND.  Creatinine 1.17 -> 1.35  R/LHC 01/07/19  Ost RCA lesion is 50% stenosed.  Mid RCA lesion is 40% stenosed.  Prox LAD lesion is 50% stenosed.  Ost 2nd Diag to 2nd Diag lesion is 100% stenosed. Findings: Done on milrinone 0.25 mcg/kg/min RA = 7  RV = 62/8 PA = 65/26 (41) PCW = 19 Fick cardiac output/index = 5.6/2.7 PVR = 3.9 WU FA sat = 95% PA sat = 65%, 66% PaPI = 5.6 Assessment: 1. Mild non-obstructive CAD 2. Moderate PAH with normal PAPI 3. Normal CO on milrinone  ECHO 01/01/2019  EF 10% with severe Spencer, severe TR, Biatrial enlargement, RV moderately reduced function.   Objective:   Weight Range: 80.6 kg Body mass index is 23.44 kg/m.   Vital Signs:   Temp:  [97.7 F (36.5 C)-98.4 F (36.9 C)] 98.4 F (36.9 C) (02/23 0324) Pulse Rate:  [80-86] 83 (02/23 0324) Resp:  [18-23] 22 (02/23 0324) BP: (103-108)/(65-71) 105/69 (02/23 0324) SpO2:  [99 %-100 %] 99 % (02/23 0742) Weight:  [80.6 kg] 80.6 kg (02/23 0324) Last BM Date: 01/25/19  Weight change: Filed Weights   01/24/19 0314 01/25/19 0500 01/26/19 0324  Weight: 81 kg 81.1 kg 80.6 kg    Intake/Output:   Intake/Output Summary (Last 24 hours) at 01/26/2019 0830 Last data filed at 01/26/2019 0300 Gross per 24 hour  Intake 480 ml  Output 575 ml  Net -95 ml    Physical Exam   General:  Sitting in chair. No resp difficulty HEENT: normal Neck: supple. JVP 8 . Carotids  2+ bilat; no bruits. No lymphadenopathy or thryomegaly appreciated. Cor: PMI laterally displaced. Regular rate & rhythm. 2/6 Spencer/TR +s3. + tunneled PICC Lungs: clear Abdomen: soft, nontender, nondistended. No hepatosplenomegaly. No bruits or masses. Good bowel sounds. Extremities: no cyanosis, clubbing, rash, trace edema + UNNA boots Neuro: alert & orientedx3, cranial nerves grossly intact. moves all 4 extremities w/o difficulty. Affect pleasant   Telemetry   NSR with BiV pacing 80-90s, personally reviewed.   Labs    CBC Recent Labs    01/25/19 0400 01/26/19 0354  WBC 7.9 8.6  HGB 9.6* 10.1*  HCT 30.9* 31.7*  MCV 85.6 85.7  PLT 280 546   Basic Metabolic Panel Recent Labs    01/25/19 0400 01/26/19 0354  NA 129* 131*  K 4.0 4.2  CL 90* 93*  CO2 28 27  GLUCOSE 144* 149*  BUN 34* 37*  CREATININE 1.17 1.35*  CALCIUM 8.6* 8.8*  MG 2.3 2.2   Liver Function Tests No results for input(s): AST, ALT, ALKPHOS, BILITOT, PROT, ALBUMIN in the last 72 hours. No results for input(s): LIPASE, AMYLASE in the last 72 hours. Cardiac Enzymes No results for input(s): CKTOTAL, CKMB, CKMBINDEX, TROPONINI in the last 72 hours.  BNP: BNP (last 3 results) Recent Labs    12/04/18 1743 12/28/18 1821 12/29/18 1943  BNP >4,500.0* >4,925.0* >  4,500.0*    ProBNP (last 3 results) No results for input(s): PROBNP in the last 8760 hours.   D-Dimer No results for input(s): DDIMER in the last 72 hours. Hemoglobin A1C No results for input(s): HGBA1C in the last 72 hours. Fasting Lipid Panel No results for input(s): CHOL, HDL, LDLCALC, TRIG, CHOLHDL, LDLDIRECT in the last 72 hours. Thyroid Function Tests No results for input(s): TSH, T4TOTAL, T3FREE, THYROIDAB in the last 72 hours.  Invalid input(s): FREET3  Other results:   Imaging    No results found.   Medications:     Scheduled Medications: . ALPRAZolam  0.25 mg Oral Once  . amiodarone  200 mg Oral Daily  . Chlorhexidine  Gluconate Cloth  6 each Topical Daily  . enoxaparin (LOVENOX) injection  40 mg Subcutaneous Q24H  . feeding supplement  1 Container Oral BID BM  . lactobacillus  1 g Oral TID WC  . mouth rinse  15 mL Mouth Rinse BID  . mometasone-formoterol  2 puff Inhalation BID  . multivitamin with minerals  1 tablet Oral Daily  . pantoprazole  40 mg Oral Daily  . polyethylene glycol  17 g Oral Daily  . senna-docusate  1 tablet Oral BID  . sodium chloride flush  10-40 mL Intracatheter Q12H  . sodium chloride flush  3 mL Intravenous Once  . sodium chloride flush  3 mL Intravenous Q12H  . sodium chloride flush  3 mL Intravenous Q12H  . spironolactone  12.5 mg Oral QHS  . tamsulosin  0.8 mg Oral QPC supper    Infusions: . sodium chloride 6.2 mL/hr at 01/24/19 0805  . 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    Admitted with marked volume overload and cardiogenic shock .   Assessment/Plan   1. A/C Systolic Heart Failure -> cardiogenic shock: Due to primarily to nonischemic cardiomyopathy (out of proportion to CAD). Spencer Rice 2015 with moderate disease. Has Biotronik BiV ICD.  Initial CO-OX 38% so milrinone started on 1/28. ECHO 01/01/19 EF 10% with severe Spencer/TR, biatrial enlargement, RV with moderately reduced function.  - Coox back down this am on milrinone 0.25 mcg/kg/min. Will repeat. If < 50% will increase milrinone to 0.375 - CVP ~8 cm this am. Hold IV lasix. Restart po torsemide in am  - Discussed at [facility] on 2/10 and  would be possible candidate once RP bleed improves. Likely will need to go Rehab for several weeks to recover then reassess. Not candidate for CIR. He has met criteria for SNF which is in place until Monday. However with co-ox dropping and inability to easily manage volume status may have to keep in house until VAD if he is deemd a candidate - Continue UNNA boots. Will need aggressive PT  2. RP bleed with symptomatic anemia - Spontaneous while on heparin (? Traumatic component). Was on heparin for h/o LV clot (no longer present) - Has some tenderness in left groin due to extension of hematoma seen on CT. This is improved - Received 2U PRBCs on 2/10  - Hgb stable at 10.1 - Continue  lovenox 40 daily for DVT prophylaxis. No AC on discharge.   3. CKD Stage III, Previously perceived creatinine baseline 1.8-2.4.   - Cr 1.17 -> 1.35  this am. Hold lasix. Recheck co-ox  4. COPD on chronic home oxygen: PFTs 01/10/19 with restriction and obstruction. DLCO 53% (14.64) - No change to current plan.    5. L Kidney Cancer: Had cryoablation in 2015. Stable.   6. PVCs: Started on amiodarone 2019 to suppress PVCs.  - Continue amiodarone 200 mg daily. No chang.e   7. Mitral Regurgitation/Tricuspid Regurgitation: Severe Spencer/TR on ECHO 01/01/19.  - No change to current plan.    8. UTI, Klebsiella pneumoniae  - Completed rocephin course. No change.   9. Pulmonary nodule: 6 mm nodule in LUL noted on CT scan.  - Recommended 6 month repeat non contrast CT. Dr Haroldine Laws discussed with Dr Prescott Gum and okay to move forward with VAD workup. No change.   10. H/O Mural Thrombus - Off coumadin due to RP hematoma. Now on prophylactic Lovenox.  - Will keep off AC with RP hematoma.    11. Deconditioning - Turned down for CIR.  - Approved for SNF. See discussion above.   Length of Stay: [place], MD  8:30 AM  Advanced Heart Failure Team Pager [phone] (M-F; 7a - 4p)  Please contact [facility]  for night-coverage after hours (4p -7a ) and weekends on [email or web address]

## 2019-01-26 NOTE — Progress Notes (Addendum)
At change of shift, patient c/o "not being able to catch his breath" up in chair at present. 02 at 4L sat 100%, turned down to 2L, called respiratory, no prn treatments noted. Lung sounds diminished to all 4 bases. Patient says urine output has not been a lot. Resp 17-18bpm. B/p 98/66  HR 77 On milronone at 6.87ml/hr at present. Patient says he was coming from rest room and sat down and it felt as though he could not catch his breath. CVP 9 at present. Will continue to monitor patient.   Respiratory here to give inhaler.   2000- Patient sitting up in chair leaning forward. Resp 14-18bpm. Conitnues to c/o SOB. On call Heart failure MD paged  2119-MD has not called this nurse back, but patient has improved with breathing. Resp 14, HR 80, no acute distress noted. Sitting up in chair at present with eyes open. Alert.Call light at bedside. Reminded to call nurse if breathing becomes labored again.  2225-MD Morrison called back. Patient is stable, resting with eyes closed. Was assisted x1 in bed. No new orders at this time. 02 sat 99% on 2L via [place]. Will continue to monitor.

## 2019-01-26 NOTE — Evaluation (Signed)
Occupational Therapy Evaluation Patient Details Name: Spencer Rice MRN: [id number] DOB: 1941/11/26 Today's Date: 01/26/2019    History of Present Illness 78yo male with ongoing SOB, chest x-ray showing pulmonary edema and R pleural effusion. Admitted for CHF exacerbation. Pt developed retroperitoneal bleed.  Pt undergoing evaluation for LVAC. PMH kidney cancer, CHF, hx DVT, AICD/PPM plancement, COPD, apical thrombus on Coumadin, hx cardiac cath    Clinical Impression   Pt admitted with above. He demonstrates the below listed deficits and will benefit from continued OT to maximize safety and independence with BADLs.  Pt presents to OT with generalized weakness, and decreased activity tolerance.  He requires min guard assist for ADLs, but requires rest breaks.  PTA, he lived at home with his wife, for whom he provides assist.  Pt needs to be fully independent with ADLs and IADLs to return home safely, otherwise, he is at risk for readmission, and injury, therefore, SNF recommended.  Will follow acutely.      Follow Up Recommendations  SNF    Equipment Recommendations  3 in 1 bedside commode    Recommendations for Other Services       Precautions / Restrictions Precautions Precautions: Fall      Mobility Bed Mobility               General bed mobility comments: sitting in chair   Transfers Overall transfer level: Needs assistance Equipment used: Rolling Spencer (2 wheeled) Transfers: Sit to/from Stand;Stand Pivot Transfers Sit to Stand: Min guard Stand pivot transfers: Min guard       General transfer comment: min guard for safety     Balance Overall balance assessment: Needs assistance Sitting-balance support: No upper extremity supported;Feet supported Sitting balance-Leahy Scale: Good     Standing balance support: No upper extremity supported Standing balance-Leahy Scale: Fair                             ADL either performed or assessed with  clinical judgement   ADL Overall ADL's : Needs assistance/impaired Eating/Feeding: Independent   Grooming: Wash/dry hands;Wash/dry face;Oral care;Brushing hair;Min guard;Standing   Upper Body Bathing: Set up;Sitting   Lower Body Bathing: Min guard;Sit to/from stand   Upper Body Dressing : Set up;Sitting   Lower Body Dressing: Min guard;Sit to/from stand   Toilet Transfer: Min guard;Ambulation;Comfort height toilet;RW   Toileting- Water quality scientist and Hygiene: Min guard;Sit to/from stand       Functional mobility during ADLs: Min guard General ADL Comments: Pt requires seated rest breaks      Vision         Perception     Praxis      Pertinent Vitals/Pain Pain Assessment: No/denies pain     Hand Dominance Right   Extremity/Trunk Assessment Upper Extremity Assessment Upper Extremity Assessment: Generalized weakness   Lower Extremity Assessment Lower Extremity Assessment: Defer to PT evaluation   Cervical / Trunk Assessment Cervical / Trunk Assessment: Normal   Communication Communication Communication: No difficulties   Cognition Arousal/Alertness: Awake/alert Behavior During Therapy: Flat affect Overall Cognitive Status: Within Functional Limits for tasks assessed                                     General Comments  VSS remained stable throughout session with 02 sats 97% on 4L supplemental 02     Exercises  Shoulder Instructions      Home Living Family/patient expects to be discharged to:: Private residence Living Arrangements: Spouse/significant other Available Help at Discharge: Family Type of Home: House Home Access: Level entry     [place]: One level     Bathroom Shower/Tub: Teacher, early years/pre: [place]: Environmental consultant - 2 wheels;Cane - single point;Other (comment)   Additional Comments: Pt lives with wife for whom he assists with IADLs       Prior Functioning/Environment  Level of Independence: Independent        Comments: Pt reports he sponge bathes due to UNA boots, and his wife does the grocery shopping because he fatigues with that         OT Problem List: Decreased strength;Decreased activity tolerance;Decreased safety awareness;Decreased knowledge of use of DME or AE;Cardiopulmonary status limiting activity      OT Treatment/Interventions: Self-care/ADL training;Energy conservation;DME and/or AE instruction;Therapeutic activities;Patient/family education;Balance training    OT Goals(Current goals can be found in the care plan section) Acute Rehab OT Goals Patient Stated Goal: to get stronger and feel better  OT Goal Formulation: With patient Time For Goal Achievement: 02/09/19 Potential to Achieve Goals: Good  OT Frequency: Min 2X/week   Barriers to D/C: Decreased caregiver support          Co-evaluation              AM-PAC OT "6 Clicks" Daily Activity     Outcome Measure Help from another person eating meals?: None Help from another person taking care of personal grooming?: A Little Help from another person toileting, which includes using toliet, bedpan, or urinal?: A Little Help from another person bathing (including washing, rinsing, drying)?: A Little Help from another person to put on and taking off regular upper body clothing?: A Little Help from another person to put on and taking off regular lower body clothing?: A Little 6 Click Score: 19   End of Session Equipment Utilized During Treatment: Gait belt;Rolling Spencer;Oxygen Nurse Communication: Mobility status  Activity Tolerance: Patient limited by fatigue Patient left: in chair;with call bell/phone within reach;with nursing/sitter in room  OT Visit Diagnosis: Unsteadiness on feet (R26.81)                Time: 8887-5797 OT Time Calculation (min): 20 min Charges:  OT General Charges $OT Visit: 1 Visit OT Evaluation $OT Eval Moderate Complexity: 1 Mod  Lucille Passy, OTR/L Acute Rehabilitation Services Pager [phone] Office [phone]   Lucille Passy M 01/26/2019, 4:54 PM

## 2019-01-27 LAB — CBC
HCT: 32.3 % — ABNORMAL LOW (ref 39.0–52.0)
Hemoglobin: 10.3 g/dL — ABNORMAL LOW (ref 13.0–17.0)
MCH: 27.3 pg (ref 26.0–34.0)
MCHC: 31.9 g/dL (ref 30.0–36.0)
MCV: 85.7 fL (ref 80.0–100.0)
Platelets: 256 10*3/uL (ref 150–400)
RBC: 3.77 MIL/uL — ABNORMAL LOW (ref 4.22–5.81)
RDW: 21.2 % — ABNORMAL HIGH (ref 11.5–15.5)
WBC: 8.2 10*3/uL (ref 4.0–10.5)
nRBC: 0 % (ref 0.0–0.2)

## 2019-01-27 LAB — MAGNESIUM: Magnesium: 2.2 mg/dL (ref 1.7–2.4)

## 2019-01-27 LAB — BASIC METABOLIC PANEL
Anion gap: 10 (ref 5–15)
BUN: 42 mg/dL — ABNORMAL HIGH (ref 8–23)
CHLORIDE: 94 mmol/L — AB (ref 98–111)
CO2: 26 mmol/L (ref 22–32)
Calcium: 8.7 mg/dL — ABNORMAL LOW (ref 8.9–10.3)
Creatinine, Ser: 1.37 mg/dL — ABNORMAL HIGH (ref 0.61–1.24)
GFR calc Af Amer: 57 mL/min — ABNORMAL LOW (ref >60)
GFR calc non Af Amer: 49 mL/min — ABNORMAL LOW (ref >60)
Glucose, Bld: 125 mg/dL — ABNORMAL HIGH (ref 70–99)
Potassium: 4.4 mmol/L (ref 3.5–5.1)
Sodium: 130 mmol/L — ABNORMAL LOW (ref 135–145)

## 2019-01-27 LAB — COOXEMETRY PANEL
Carboxyhemoglobin: 2 % — ABNORMAL HIGH (ref 0.5–1.5)
Methemoglobin: 1.8 % — ABNORMAL HIGH (ref 0.0–1.5)
O2 Saturation: 62.6 %
Total hemoglobin: 10.8 g/dL — ABNORMAL LOW (ref 12.0–16.0)

## 2019-01-27 MED ORDER — FUROSEMIDE 10 MG/ML IJ SOLN
80.0000 mg | Freq: Once | INTRAMUSCULAR | Status: AC
  Administered 2019-01-27: 80 mg via INTRAVENOUS
  Filled 2019-01-27: qty 8

## 2019-01-27 NOTE — Progress Notes (Signed)
PT Cancellation Note  Patient Details Name: Spencer Rice MRN: [id number] DOB: Apr 25, 1941   Cancelled Treatment:    Reason Eval/Treat Not Completed: Other (comment)(pt continues to be sleeping soundly)   [place] 01/27/2019, 3:12 PM [place] Pager [phone] Office [phone]

## 2019-01-27 NOTE — Progress Notes (Signed)
PT Cancellation Note  Patient Details Name: Nichola Warren MRN: [id number] DOB: 1941/11/01   Cancelled Treatment:    Reason Eval/Treat Not Completed: Other (comment). Attempted in AM and pt declined due to exhausted after not sleeping last night. Now pt sleeping soundly and had been asked to be able to sleep. Will continue attempts.   Shary Decamp Maycok 01/27/2019, 2:08 PM Jden Want [place] Pager [phone] Office [phone]

## 2019-01-27 NOTE — Progress Notes (Signed)
Co ox sent to respiratory, drawn from right tunneled picc line. Labs also drawn and sent.

## 2019-01-27 NOTE — Progress Notes (Addendum)
Patient ID: Spencer Rice, male   DOB: 1941/04/15, 78 y.o.   MRN: [id number]     Advanced Heart Failure Rounding Note  PCP-Cardiologist: No primary care provider on file.   Subjective:    Developed large spontaneous RP bleed on heparin 2/10. 2/11 received 2UPRBCs.   Insurance denied SNF. Peer-to-Peer pending. Held 2/21 due to falling co-ox at 43.4% despite milrinone 0.25 mcg/kg/min.   Coox back to 62.6% with diuresis over wekeend. Cr stable 1.37. K 4.4. CVP 11-12 on my personal check seated upright in chair. Weight stable.   Feeling OK this am. Remains SOB walking halls. Had episode of acute SOB last night while having bowel movement, but improved without intervention. Denies CP, lightheadedness or dizziness.   [facility] 01/07/19  Ost RCA lesion is 50% stenosed.  Mid RCA lesion is 40% stenosed.  Prox LAD lesion is 50% stenosed.  Ost 2nd Diag to 2nd Diag lesion is 100% stenosed. Findings: Done on milrinone 0.25 mcg/kg/min RA = 7  RV = 62/8 PA = 65/26 (41) PCW = 19 Fick cardiac output/index = 5.6/2.7 PVR = 3.9 WU FA sat = 95% PA sat = 65%, 66% PaPI = 5.6 Assessment: 1. Mild non-obstructive CAD 2. Moderate PAH with normal PAPI 3. Normal CO on milrinone  ECHO 01/01/2019  EF 10% with severe MR, severe TR, Biatrial enlargement, RV moderately reduced function.   Objective:   Weight Range: 80.5 kg Body mass index is 23.41 kg/m.   Vital Signs:   Temp:  [97.6 F (36.4 C)-98.2 F (36.8 C)] 98.2 F (36.8 C) (02/24 0731) Pulse Rate:  [64-82] 74 (02/24 0731) Resp:  [14-189] 21 (02/24 0731) BP: (95-103)/(62-76) 96/62 (02/24 0731) SpO2:  [91 %-100 %] 98 % (02/24 0405) Weight:  [80.5 kg] 80.5 kg (02/24 0405) Last BM Date: 01/25/19  Weight change: Filed Weights   01/25/19 0500 01/26/19 0324 01/27/19 0405  Weight: 81.1 kg 80.6 kg 80.5 kg   Intake/Output:   Intake/Output Summary (Last 24 hours) at 01/27/2019 0753 Last data filed at 01/27/2019 0400 Gross per 24 hour    Intake 74.4 ml  Output 300 ml  Net -225.6 ml    Physical Exam   General: NAD HEENT: Normal Neck: Supple. JVP ~11-12 cm. Carotids 2+ bilat; no bruits. No thyromegaly or nodule noted. Cor: PMI nondisplaced. RRR, 2/6 MR/TR. + S3. + Tunneled PICC.  Lungs: Clear, normal effort.  Abdomen: Soft, non-tender, non-distended, no HSM. No bruits or masses. +BS  Extremities: No cyanosis, clubbing, or rash. + UNNA boots.  Neuro: Alert & orientedx3, cranial nerves grossly intact. moves all 4 extremities w/o difficulty. Affect pleasant   Telemetry   NSR with BiV pacing 80-90s, personally reviewed.   Labs    CBC Recent Labs    01/26/19 0354 01/27/19 0343  WBC 8.6 8.2  HGB 10.1* 10.3*  HCT 31.7* 32.3*  MCV 85.7 85.7  PLT 298 619   Basic Metabolic Panel Recent Labs    01/26/19 0354 01/27/19 0343  NA 131* 130*  K 4.2 4.4  CL 93* 94*  CO2 27 26  GLUCOSE 149* 125*  BUN 37* 42*  CREATININE 1.35* 1.37*  CALCIUM 8.8* 8.7*  MG 2.2 2.2   Liver Function Tests No results for input(s): AST, ALT, ALKPHOS, BILITOT, PROT, ALBUMIN in the last 72 hours. No results for input(s): LIPASE, AMYLASE in the last 72 hours. Cardiac Enzymes No results for input(s): CKTOTAL, CKMB, CKMBINDEX, TROPONINI in the last 72 hours.  BNP: BNP (last 3 results) Recent  Labs    12/04/18 1743 12/28/18 1821 12/29/18 1943  BNP >4,500.0* >4,925.0* >4,500.0*    ProBNP (last 3 results) No results for input(s): PROBNP in the last 8760 hours.   D-Dimer No results for input(s): DDIMER in the last 72 hours. Hemoglobin A1C No results for input(s): HGBA1C in the last 72 hours. Fasting Lipid Panel No results for input(s): CHOL, HDL, LDLCALC, TRIG, CHOLHDL, LDLDIRECT in the last 72 hours. Thyroid Function Tests No results for input(s): TSH, T4TOTAL, T3FREE, THYROIDAB in the last 72 hours.  Invalid input(s): FREET3  Other results:   Imaging    No results found.   Medications:     Scheduled  Medications: . ALPRAZolam  0.25 mg Oral Once  . amiodarone  200 mg Oral Daily  . Chlorhexidine Gluconate Cloth  6 each Topical Daily  . enoxaparin (LOVENOX) injection  40 mg Subcutaneous Q24H  . feeding supplement  1 Container Oral BID BM  . lactobacillus  1 g Oral TID WC  . mouth rinse  15 mL Mouth Rinse BID  . mometasone-formoterol  2 puff Inhalation BID  . multivitamin with minerals  1 tablet Oral Daily  . pantoprazole  40 mg Oral Daily  . polyethylene glycol  17 g Oral Daily  . senna-docusate  1 tablet Oral BID  . sodium chloride flush  10-40 mL Intracatheter Q12H  . sodium chloride flush  3 mL Intravenous Q12H  . sodium chloride flush  3 mL Intravenous Q12H  . spironolactone  12.5 mg Oral QHS  . tamsulosin  0.8 mg Oral QPC supper    Infusions: . sodium chloride 6.2 mL/hr at 01/24/19 0805  . 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    Admitted with marked volume overload and cardiogenic shock .   Assessment/Plan   1. A/C Systolic Heart Failure -> cardiogenic shock: Due to primarily to nonischemic cardiomyopathy (out of proportion to CAD). [place] 2015 with moderate disease. Has Biotronik BiV ICD.  Initial CO-OX 38% so milrinone started on 1/28. ECHO 01/01/19 EF 10% with severe MR/TR, biatrial enlargement, RV with moderately reduced function.  - Coox 62.6% this am on milrinone 0.25 mcg/kg/min.  - CVP 11-12 this am. Had planned to restart torsemide, but will give IV lasix 80 mv x one this am. Discussed with MD.    - Discussed at [place] on 2/10 and would be possible candidate once RP bleed improves. Likely will need to go Rehab for several weeks to recover then reassess. Not candidate for CIR. He has met criteria for SNF, but insurance denied. Peer-to-peer pending. He remains tenuous.  - Continue UNNA boots. Will need aggressive PT  2. RP bleed with symptomatic anemia - Spontaneous while on heparin (? Traumatic component). Was on heparin for h/o LV clot (no longer present) - Has some tenderness in left groin due to extension of hematoma seen on CT. This is improved - Received 2U PRBCs on 2/10  - Hgb stable at 10.3.  - Continue lovenox 40 daily for DVT prophylaxis. No AC on discharge.   3. CKD Stage III,  Previously perceived creatinine baseline 1.8-2.4.   - Cr 1.37 this am.   4. COPD on chronic home oxygen: PFTs 01/10/19 with restriction and obstruction. DLCO 53% (14.64) - No change to current plan.    5. L Kidney Cancer: Had cryoablation in 2015. Stable.   6. PVCs: Started on amiodarone 2019 to suppress PVCs.  - Continue amiodarone 200 mg daily. No change.   7. 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  - No change to current plan.    8. UTI, Klebsiella pneumoniae  - Completed rocephin course. No change.   9. Pulmonary nodule: 6 mm nodule in LUL noted on CT scan.  - Recommended 6 month repeat non contrast CT. Dr Haroldine Laws discussed with Dr Prescott Gum and okay to move forward with VAD workup. No change.   10. H/O Mural Thrombus - Off coumadin due to RP hematoma. Now on prophylactic Lovenox.  - Will keep off AC with RP hematoma. No change.    11. Deconditioning - Turned down for CIR.  - Recommended for SNF, but insurance denied. Peer-to-peer needed.   Possibly to SNF today is stable, Peer-to-peer approved, and bed available. If worsens, will need to stay in house for VAD consideration.   Length of Stay: [place], [place]  7:53  AM  Advanced Heart Failure Team Pager [phone] (M-F; 7a - 4p)  Please contact [facility] for night-coverage after hours (4p -7a ) and weekends on [email or web address]  Patient seen and examined with the above-signed Advanced Practice Provider and/or Housestaff. I personally reviewed laboratory data, imaging studies and relevant notes. I independently examined the patient and formulated the important aspects of the plan. I have edited the note to reflect any of my changes or salient points. I have personally discussed the plan with the patient and/or family.  He remains on milrinone. Co-ox low over the weekend but now better. CVP up but improving with IV lasix. HGb stable.  He remains very weak with poor nutritional status.   Will continue milrinone. Currently not a VAD candidate but will re-evaluate once he gets stronger. I had peer-to-peer today with [employer] and he will be approved for stay at SNF to see if we can get him strong enough for VAD placement.   Glori Bickers, MD  3:16 PM

## 2019-01-27 NOTE — Progress Notes (Signed)
Pt sleeping soundly. RN asked me to let him sleep as he didn't last night. She or PT will walk with him later.  Yves Dill CES, ACSM 1:57 PM 01/27/2019

## 2019-01-27 NOTE — Clinical Social Work Note (Signed)
Insurance authorization still pending.  Ludie Hudon, CSW [phone]  

## 2019-01-28 LAB — BASIC METABOLIC PANEL
Anion gap: 12 (ref 5–15)
BUN: 47 mg/dL — ABNORMAL HIGH (ref 8–23)
CO2: 26 mmol/L (ref 22–32)
Calcium: 8.8 mg/dL — ABNORMAL LOW (ref 8.9–10.3)
Chloride: 94 mmol/L — ABNORMAL LOW (ref 98–111)
Creatinine, Ser: 1.41 mg/dL — ABNORMAL HIGH (ref 0.61–1.24)
GFR calc Af Amer: 55 mL/min — ABNORMAL LOW (ref >60)
GFR calc non Af Amer: 48 mL/min — ABNORMAL LOW (ref >60)
Glucose, Bld: 117 mg/dL — ABNORMAL HIGH (ref 70–99)
Potassium: 3.8 mmol/L (ref 3.5–5.1)
Sodium: 132 mmol/L — ABNORMAL LOW (ref 135–145)

## 2019-01-28 LAB — CBC
HCT: 31.6 % — ABNORMAL LOW (ref 39.0–52.0)
Hemoglobin: 10 g/dL — ABNORMAL LOW (ref 13.0–17.0)
MCH: 27.1 pg (ref 26.0–34.0)
MCHC: 31.6 g/dL (ref 30.0–36.0)
MCV: 85.6 fL (ref 80.0–100.0)
Platelets: 237 10*3/uL (ref 150–400)
RBC: 3.69 MIL/uL — ABNORMAL LOW (ref 4.22–5.81)
RDW: 21.1 % — ABNORMAL HIGH (ref 11.5–15.5)
WBC: 7.6 10*3/uL (ref 4.0–10.5)
nRBC: 0 % (ref 0.0–0.2)

## 2019-01-28 LAB — COOXEMETRY PANEL
Carboxyhemoglobin: 2.3 % — ABNORMAL HIGH (ref 0.5–1.5)
Methemoglobin: 1.2 % (ref 0.0–1.5)
O2 Saturation: 57.4 %
TOTAL HEMOGLOBIN: 10.4 g/dL — AB (ref 12.0–16.0)

## 2019-01-28 LAB — MAGNESIUM: MAGNESIUM: 2.3 mg/dL (ref 1.7–2.4)

## 2019-01-28 LAB — PREALBUMIN: Prealbumin: 14.1 mg/dL — ABNORMAL LOW (ref 18–38)

## 2019-01-28 MED ORDER — TORSEMIDE 20 MG PO TABS
40.0000 mg | ORAL_TABLET | Freq: Every day | ORAL | Status: DC
  Administered 2019-01-28: 40 mg via ORAL
  Filled 2019-01-28: qty 2

## 2019-01-28 MED ORDER — TORSEMIDE 20 MG PO TABS
40.0000 mg | ORAL_TABLET | Freq: Two times a day (BID) | ORAL | Status: DC
  Administered 2019-01-28 – 2019-01-29 (×2): 40 mg via ORAL
  Filled 2019-01-28 (×2): qty 2

## 2019-01-28 NOTE — Progress Notes (Signed)
CARDIAC REHAB PHASE I   PRE:  Rate/Rhythm: 72 pacing    BP: sitting 102/56    SaO2: 100 2L  MODE:  Ambulation: 350 ft   POST:  Rate/Rhythm: 92 pacing iwht 4 bts VT    BP: sitting 110/72     SaO2: 100 2L  Tolerated well. Increased distance but a little tired after walk. Return to recliner. [place], ACSM 01/28/2019 3:28 PM

## 2019-01-28 NOTE — Progress Notes (Addendum)
Patient ID: Spencer Rice, male   DOB: 1941/04/19, 78 y.o.   MRN: [id number]     Advanced Heart Failure Rounding Note  PCP-Cardiologist: No primary care provider on file.   Subjective:    Developed large spontaneous RP bleed on heparin 2/10. 2/11 received 2UPRBCs.   Insurance approved SNF with Peer to Peer 01/27/19.   Coox 57.4% this am on 0.25 mcg/kg/min. Cr 1.41. K 3.8 with dose of IV lasix yesterday. CVP 7-8 cm  Feeling Ok this am. Denies SOB walking around room. Didn't work with PT yesterday. He turned them away twice. Denies lightheadedness or dizziness. No CP.   [facility] 01/07/19  Ost RCA lesion is 50% stenosed.  Mid RCA lesion is 40% stenosed.  Prox LAD lesion is 50% stenosed.  Ost 2nd Diag to 2nd Diag lesion is 100% stenosed. Findings: Done on milrinone 0.25 mcg/kg/min RA = 7  RV = 62/8 PA = 65/26 (41) PCW = 19 Fick cardiac output/index = 5.6/2.7 PVR = 3.9 WU FA sat = 95% PA sat = 65%, 66% PaPI = 5.6 Assessment: 1. Mild non-obstructive CAD 2. Moderate PAH with normal PAPI 3. Normal CO on milrinone  ECHO 01/01/2019  EF 10% with severe MR, severe TR, Biatrial enlargement, RV moderately reduced function.   Objective:   Weight Range: 80.3 kg Body mass index is 23.35 kg/m.   Vital Signs:   Temp:  [97.6 F (36.4 C)-98.3 F (36.8 C)] 98 F (36.7 C) (02/25 0410) Pulse Rate:  [70-82] 77 (02/25 0410) Resp:  [18-29] 18 (02/25 0410) BP: (93-109)/(67-71) 102/67 (02/25 0410) SpO2:  [98 %-100 %] 100 % (02/25 0410) Weight:  [80.3 kg] 80.3 kg (02/25 0416) Last BM Date: 01/25/19  Weight change: Filed Weights   01/26/19 0324 01/27/19 0405 01/28/19 0416  Weight: 80.6 kg 80.5 kg 80.3 kg   Intake/Output:   Intake/Output Summary (Last 24 hours) at 01/28/2019 0827 Last data filed at 01/28/2019 0400 Gross per 24 hour  Intake 143.9 ml  Output 700 ml  Net -556.1 ml    Physical Exam   General: NAD HEENT: Normal Neck: Supple. JVP 7-8 cm. Carotids 2+ bilat; no  bruits. No thyromegaly or nodule noted. Cor: PMI nondisplaced. RRR, 2/6 MR/TR, +S3, + Tunneled PICC.  Lungs: CTAB, normal effort. Abdomen: Soft, non-tender, non-distended, no HSM. No bruits or masses. +BS  Extremities: No cyanosis, clubbing, or rash. + UNNA boots.   Neuro: Alert & orientedx3, cranial nerves grossly intact. moves all 4 extremities w/o difficulty. Affect flat  Telemetry   NSR with BiV pacing 70-80s, personally reviewed.   Labs    CBC Recent Labs    01/27/19 0343 01/28/19 0401  WBC 8.2 7.6  HGB 10.3* 10.0*  HCT 32.3* 31.6*  MCV 85.7 85.6  PLT 256 003   Basic Metabolic Panel Recent Labs    01/27/19 0343 01/28/19 0401  NA 130* 132*  K 4.4 3.8  CL 94* 94*  CO2 26 26  GLUCOSE 125* 117*  BUN 42* 47*  CREATININE 1.37* 1.41*  CALCIUM 8.7* 8.8*  MG 2.2 2.3   Liver Function Tests No results for input(s): AST, ALT, ALKPHOS, BILITOT, PROT, ALBUMIN in the last 72 hours. No results for input(s): LIPASE, AMYLASE in the last 72 hours. Cardiac Enzymes No results for input(s): CKTOTAL, CKMB, CKMBINDEX, TROPONINI in the last 72 hours.  BNP: BNP (last 3 results) Recent Labs    12/04/18 1743 12/28/18 1821 12/29/18 1943  BNP >4,500.0* >4,925.0* >4,500.0*    ProBNP (last 3  results) No results for input(s): PROBNP in the last 8760 hours.   D-Dimer No results for input(s): DDIMER in the last 72 hours. Hemoglobin A1C No results for input(s): HGBA1C in the last 72 hours. Fasting Lipid Panel No results for input(s): CHOL, HDL, LDLCALC, TRIG, CHOLHDL, LDLDIRECT in the last 72 hours. Thyroid Function Tests No results for input(s): TSH, T4TOTAL, T3FREE, THYROIDAB in the last 72 hours.  Invalid input(s): FREET3  Other results:   Imaging    No results found.   Medications:     Scheduled Medications: . ALPRAZolam  0.25 mg Oral Once  . amiodarone  200 mg Oral Daily  . Chlorhexidine Gluconate Cloth  6 each Topical Daily  . enoxaparin (LOVENOX)  injection  40 mg Subcutaneous Q24H  . feeding supplement  1 Container Oral BID BM  . lactobacillus  1 g Oral TID WC  . mouth rinse  15 mL Mouth Rinse BID  . mometasone-formoterol  2 puff Inhalation BID  . multivitamin with minerals  1 tablet Oral Daily  . pantoprazole  40 mg Oral Daily  . polyethylene glycol  17 g Oral Daily  . senna-docusate  1 tablet Oral BID  . sodium chloride flush  10-40 mL Intracatheter Q12H  . sodium chloride flush  3 mL Intravenous Q12H  . sodium chloride flush  3 mL Intravenous Q12H  . spironolactone  12.5 mg Oral QHS  . tamsulosin  0.8 mg Oral QPC supper    Infusions: . sodium chloride 6.2 mL/hr at 01/24/19 0805  . 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    Admitted with marked volume overload and cardiogenic shock .   Assessment/Plan   1. A/C Systolic Heart Failure -> cardiogenic shock: Due to primarily to nonischemic cardiomyopathy (out of proportion to CAD). [place] 2015 with moderate disease. Has Biotronik BiV ICD.  Initial CO-OX 38% so milrinone started on 1/28.  - Pt NYHA IV on admit, now NYHA III-IIIb on milrinone support.  - ECHO 01/01/19 EF 10% with severe MR/TR, biatrial enlargement, RV with moderately reduced function.  - Coox 57.4% this am on milrinone 0.25 mcg/kg/min.  - CVP 7-8 cm. Will start torsemide at 40 mg daily, possible more (was taking 30 mg q am and 20 mg q pm at home).  - Discussed at [place] on 2/10 and would be possible candidate once RP bleed improves.  Likely will need to go Rehab for several weeks to recover then reassess. Not candidate for CIR. He has met criteria for SNF, but insurance denied. Peer-to-peer pending. He remains tenuous.  - Continue UNNA boots. Will need aggressive PT  2. RP bleed with symptomatic anemia - Spontaneous while on heparin (? Traumatic component). Was on heparin for h/o LV clot (no longer present) - Has some tenderness in left groin due to extension of hematoma seen on CT. This is improved - Received 2U PRBCs on 2/10  - Hgb stable at 1.0 - Continue lovenox 40 daily for DVT prophylaxis. No AC on discharge.   3. CKD Stage III, Previously perceived creatinine baseline 1.8-2.4.   -  Cr 1.41 this am.   4. COPD on chronic home oxygen: PFTs 01/10/19 with restriction and obstruction. DLCO 53% (14.64) - No change to current plan.    5. L Kidney Cancer: Had cryoablation in 2015. Stable.  6. PVCs: Started on amiodarone 2019 to suppress PVCs.  - Continue amiodarone 200 mg daily. No change.  7. 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  - No change to current plan.    8. UTI, Klebsiella pneumoniae  - Completed rocephin course. No change.   9. Pulmonary nodule: 6 mm nodule in LUL noted on CT scan.  - Recommended 6 month repeat non contrast CT. Dr Haroldine Laws discussed with Dr Prescott Gum and okay to move forward with VAD workup. No change.   10. H/O Mural Thrombus - Off coumadin due to RP hematoma. Now on prophylactic Lovenox.  - Will keep off AC with RP hematoma. No change.    11. Deconditioning - Turned down for CIR.  - Approved for SNF with Peer-to-Peer. Likely tomorrow if stable on po diuretics.   Length of Stay: [place], [place]  8:27 AM  Advanced Heart Failure Team Pager [phone] (M-F; 7a - 4p)  Please contact [facility] for night-coverage after hours (4p -7a ) and weekends on [email or web address]  Patient seen and examined with the above-signed Advanced  Practice Provider and/or Housestaff. I personally reviewed laboratory data, imaging studies and relevant notes. I independently examined the patient and formulated the important aspects of the plan. I have edited the note to reflect any of my changes or salient points. I have personally discussed the plan with the patient and/or family.  He remains on milrinone for refractory systolic HF, NYHA IV. Will continue milrinone at 0.25. Needs VAD but course c/b large RP bleed and debility. Volume status now improved on IV lasix. Co-ox stable. Will plan switch to oral torsemide today. If co-ox and CVP stable, possibly to SNF tomorrow with home milrinone per [facility]. I have discussed at length with VAD team and [facility]. Will re-evaluate for VAD in 1-2 weeks. Prealbumin 14. Will need aggressive nutritional support.   Total time spent 35 minutes. Over half that time spent discussing above.   Glori Bickers, MD  9:09 AM

## 2019-01-28 NOTE — Progress Notes (Signed)
CVP 12 on recheck this afternoon. Increase torsemide to 40 mg BID.  Georgiana Shore, NP

## 2019-01-28 NOTE — Social Work (Signed)
[place] has [employer] fast streamer approved. [employer] staff is being trained on milrinone pump this afternoon. CSW to follow and support with discharge planning.  Estanislado Emms, LCSW [phone]

## 2019-01-28 NOTE — Progress Notes (Signed)
Patient ID: Spencer Rice, male   DOB: September 29, 1941, 78 y.o.   MRN: [id number]  This NP visited patient at the bedside as a follow up for palliative medicine needs and emotional support.  Plan is for discharge to [facility] with Milrinone pump.  LVAD may be considered in the future.  Discussed with patient the importance of continued conversation with his family and his  medical providers regarding overall plan of care and treatment options,  ensuring decisions are within the context of the patients values and GOCs.  Recommend community based  PMT to follow as OP  No charge  Greater than 50% of the time was spent in counseling and coordination of care  Wadie Lessen NP  Palliative Medicine Team Team Phone # [phone] Pager [phone]

## 2019-01-28 NOTE — Progress Notes (Signed)
Physical Therapy Treatment Patient Details Name: Spencer Rice MRN: [id number] DOB: 07-17-1941 Today's Date: 01/28/2019    History of Present Illness 78yo male with ongoing SOB, chest x-ray showing pulmonary edema and R pleural effusion. Admitted for CHF exacerbation. Pt developed retroperitoneal bleed.  Pt undergoing evaluation for LVAC. PMH kidney cancer, CHF, hx DVT, AICD/PPM plancement, COPD, apical thrombus on Coumadin, hx cardiac cath     PT Comments    Pt continues to have limited activity tolerance and continue to recommend further rehab at [facility].   Follow Up Recommendations  SNF;Supervision/Assistance - 24 hour     Equipment Recommendations  Rolling walker with 5" wheels    Recommendations for Other Services       Precautions / Restrictions Precautions Precautions: Fall Restrictions Weight Bearing Restrictions: No    Mobility  Bed Mobility               General bed mobility comments: Pt up in chair  Transfers Overall transfer level: Needs assistance Equipment used: Rolling walker (2 wheeled) Transfers: Sit to/from Stand Sit to Stand: Min guard         General transfer comment: Assist for safety  Ambulation/Gait Ambulation/Gait assistance: Min guard Gait Distance (Feet): 275 Feet Assistive device: Rolling walker (2 wheeled) Gait Pattern/deviations: Step-through pattern;Decreased stride length;Trunk flexed Gait velocity: decreased  Gait velocity interpretation: <1.31 ft/sec, indicative of household ambulator General Gait Details: Assist for safety and lines. Pt amb on 2L of O2 with SpO2 98%. Verbal cues to stand more erect   Stairs             Wheelchair Mobility    Modified Rankin (Stroke Patients Only)       Balance Overall balance assessment: Needs assistance Sitting-balance support: No upper extremity supported;Feet supported Sitting balance-Leahy Scale: Good     Standing balance support: No upper extremity  supported Standing balance-Leahy Scale: Fair                              Cognition Arousal/Alertness: Awake/alert Behavior During Therapy: Flat affect Overall Cognitive Status: Within Functional Limits for tasks assessed                                        Exercises      General Comments        Pertinent Vitals/Pain Pain Assessment: No/denies pain    Home Living                      Prior Function            PT Goals (current goals can now be found in the care plan section) Progress towards PT goals: Progressing toward goals    Frequency    Min 2X/week      PT Plan Current plan remains appropriate    Co-evaluation              AM-PAC PT "6 Clicks" Mobility   Outcome Measure  Help needed turning from your back to your side while in a flat bed without using bedrails?: A Little Help needed moving from lying on your back to sitting on the side of a flat bed without using bedrails?: A Little Help needed moving to and from a bed to a chair (including a wheelchair)?: A Little Help needed standing up from a  chair using your arms (e.g., wheelchair or bedside chair)?: A Little Help needed to walk in hospital room?: A Little Help needed climbing 3-5 steps with a railing? : A Little 6 Click Score: 18    End of Session Equipment Utilized During Treatment: Oxygen Activity Tolerance: Patient tolerated treatment well Patient left: in chair;with call bell/phone within reach Nurse Communication: Mobility status PT Visit Diagnosis: Muscle weakness (generalized) (M62.81);Other abnormalities of gait and mobility (R26.89)     Time: 5465-0354 PT Time Calculation (min) (ACUTE ONLY): 29 min  Charges:  $Gait Training: 23-37 mins                     [place] Pager [phone] Office [place] 01/28/2019, 10:25 AM

## 2019-01-29 LAB — CBC
HCT: 30.2 % — ABNORMAL LOW (ref 39.0–52.0)
Hemoglobin: 9.5 g/dL — ABNORMAL LOW (ref 13.0–17.0)
MCH: 27.2 pg (ref 26.0–34.0)
MCHC: 31.5 g/dL (ref 30.0–36.0)
MCV: 86.5 fL (ref 80.0–100.0)
Platelets: 222 10*3/uL (ref 150–400)
RBC: 3.49 MIL/uL — ABNORMAL LOW (ref 4.22–5.81)
RDW: 21.2 % — ABNORMAL HIGH (ref 11.5–15.5)
WBC: 7.2 10*3/uL (ref 4.0–10.5)
nRBC: 0 % (ref 0.0–0.2)

## 2019-01-29 LAB — COOXEMETRY PANEL
Carboxyhemoglobin: 2.1 % — ABNORMAL HIGH (ref 0.5–1.5)
Methemoglobin: 1.6 % — ABNORMAL HIGH (ref 0.0–1.5)
O2 Saturation: 60.9 %
Total hemoglobin: 10.7 g/dL — ABNORMAL LOW (ref 12.0–16.0)

## 2019-01-29 LAB — BASIC METABOLIC PANEL
Anion gap: 10 (ref 5–15)
BUN: 43 mg/dL — ABNORMAL HIGH (ref 8–23)
CO2: 28 mmol/L (ref 22–32)
Calcium: 8.4 mg/dL — ABNORMAL LOW (ref 8.9–10.3)
Chloride: 96 mmol/L — ABNORMAL LOW (ref 98–111)
Creatinine, Ser: 1.41 mg/dL — ABNORMAL HIGH (ref 0.61–1.24)
GFR calc non Af Amer: 48 mL/min — ABNORMAL LOW (ref >60)
GFR, EST AFRICAN AMERICAN: 55 mL/min — AB (ref >60)
Glucose, Bld: 159 mg/dL — ABNORMAL HIGH (ref 70–99)
Potassium: 3.2 mmol/L — ABNORMAL LOW (ref 3.5–5.1)
Sodium: 134 mmol/L — ABNORMAL LOW (ref 135–145)

## 2019-01-29 LAB — MAGNESIUM: Magnesium: 1.9 mg/dL (ref 1.7–2.4)

## 2019-01-29 MED ORDER — PANTOPRAZOLE SODIUM 40 MG PO TBEC: 40.0000 mg | DELAYED_RELEASE_TABLET | Freq: Every day | ORAL | 5 refills | Status: DC

## 2019-01-29 MED ORDER — TORSEMIDE 20 MG PO TABS: 40.0000 mg | ORAL_TABLET | Freq: Two times a day (BID) | ORAL | 5 refills | Status: DC

## 2019-01-29 MED ORDER — MILRINONE LACTATE IN DEXTROSE 20-5 MG/100ML-% IV SOLN: 0.2500 ug/kg/min | INTRAVENOUS | 0 refills | Status: DC

## 2019-01-29 MED ORDER — SPIRONOLACTONE 25 MG PO TABS: 12.5000 mg | ORAL_TABLET | Freq: Every day | ORAL | 5 refills | Status: DC

## 2019-01-29 MED ORDER — POTASSIUM CHLORIDE CRYS ER 20 MEQ PO TBCR
60.0000 meq | EXTENDED_RELEASE_TABLET | Freq: Once | ORAL | Status: AC
  Administered 2019-01-29: 60 meq via ORAL
  Filled 2019-01-29: qty 3

## 2019-01-29 MED ORDER — ACETAMINOPHEN 325 MG PO TABS: 650.0000 mg | ORAL_TABLET | ORAL | Status: AC | PRN

## 2019-01-29 MED ORDER — FLORANEX PO PACK: 1.0000 g | PACK | Freq: Three times a day (TID) | ORAL | Status: DC

## 2019-01-29 MED ORDER — POTASSIUM CHLORIDE CRYS ER 20 MEQ PO TBCR: 40.0000 meq | EXTENDED_RELEASE_TABLET | Freq: Every day | ORAL | 5 refills | Status: DC

## 2019-01-29 MED ORDER — POLYETHYLENE GLYCOL 3350 17 G PO PACK: 17.0000 g | PACK | Freq: Every day | ORAL | 0 refills | Status: DC

## 2019-01-29 MED ORDER — POTASSIUM CHLORIDE CRYS ER 20 MEQ PO TBCR: 40.0000 meq | EXTENDED_RELEASE_TABLET | Freq: Every day | ORAL | Status: DC

## 2019-01-29 MED ORDER — TAMSULOSIN HCL 0.4 MG PO CAPS: 0.8000 mg | ORAL_CAPSULE | Freq: Every day | ORAL | 5 refills | Status: DC

## 2019-01-29 NOTE — Clinical Social Work Note (Signed)
CSW facilitated patient discharge including contacting patient family and facility to confirm patient discharge plans. Clinical information faxed to facility and family agreeable with plan. CSW arranged ambulance transport via [facility] to [place] at 1:30 pm. Spencer Rice is aware of Milrinone pump. RN to call report prior to discharge [phone]).  CSW will sign off for now as social work intervention is no longer needed. Please consult [place] again if new needs arise.  Dayton Scrape, [place]

## 2019-01-29 NOTE — Plan of Care (Signed)
  Problem: Education: Goal: Individualized Educational Video(s) Outcome: Progressing   Problem: Education: Goal: Ability to demonstrate management of disease process will improve Outcome: Progressing Goal: Ability to verbalize understanding of medication therapies will improve Outcome: Progressing   Problem: Activity: Goal: Capacity to carry out activities will improve Outcome: Progressing   Problem: Cardiac: Goal: Ability to achieve and maintain adequate cardiopulmonary perfusion will improve Outcome: Progressing

## 2019-01-29 NOTE — Progress Notes (Signed)
Called and gave report to Janeece Fitting, RN at [facility] [phone] and gave report on patient.

## 2019-01-29 NOTE — Progress Notes (Signed)
Patient is ready for discharge. [facility] is here to transport patient to [facility]. Paperwork has been given to [employer]. Patient will leave with PICC. He will leave on 2L of Oxygen. He is alert and oriented and has all of his belongings with him. Report has been given to nurse at [facility].

## 2019-01-29 NOTE — Clinical Social Work Placement (Signed)
   CLINICAL SOCIAL WORK PLACEMENT  NOTE  Date:  01/29/2019  Patient Details  Name: Spencer Rice MRN: [id number] Date of Birth: [birth year]  Clinical Social Work is seeking post-discharge placement for this patient at the [place] level of care (*CSW will initial, date and re-position this form in  chart as items are completed):      Patient/family provided with [place] Work Department's list of facilities offering this level of care within the geographic area requested by the patient (or if unable, by the patient's family).      Patient/family informed of their freedom to choose among providers that offer the needed level of care, that participate in Medicare, Medicaid or managed care program needed by the patient, have an available bed and are willing to accept the patient.      Patient/family informed of [facility]'s ownership interest in [facility] and [facility], as well as of the fact that they are under no obligation to receive care at these facilities.  PASRR submitted to E[facility] on 01/22/19     PASRR number received on 01/22/19     Existing PASRR number confirmed on       FL2 transmitted to all facilities in geographic area requested by pt/family on 01/22/19     FL2 transmitted to all facilities within larger geographic area on       Patient informed that his/her managed care company has contracts with or will negotiate with certain facilities, including the following:        Yes   Patient/family informed of bed offers received.  Patient chooses bed at [facility]     Physician recommends and patient chooses bed at      Patient to be transferred to [facility] on 01/29/19.  Patient to be transferred to facility by [facility]     Patient family notified on 01/29/19 of transfer.  Name of family member notified:  [name]     PHYSICIAN Please prepare prescriptions     Additional  Comment:    _______________________________________________ Candie Chroman, LCSW 01/29/2019, 11:58 AM

## 2019-01-29 NOTE — Clinical Social Work Note (Signed)
Pam with [facility] will be here around 10:30/11:00 to set up patient's Milrinone pump. [facility] admissions coordinator aware.  Dayton Scrape, [place]

## 2019-01-29 NOTE — Progress Notes (Signed)
Occupational Therapy Treatment Patient Details Name: Spencer Rice MRN: [id number] DOB: Aug 21, 1941 Today's Date: 01/29/2019    History of present illness 78yo male with ongoing SOB, chest x-ray showing pulmonary edema and R pleural effusion. Admitted for CHF exacerbation. Pt developed retroperitoneal bleed.  Pt undergoing evaluation for LVAC. 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 Performed one grooming activity in standing and then requested chair to complete 2 more. Pt toileted with min guard assist and use of BSC. Pt with stable VS on 2L 02. Remains appropriate for SNF level rehab.  Follow Up Recommendations  SNF    Equipment Recommendations  3 in 1 bedside commode    Recommendations for Other Services      Precautions / Restrictions Precautions Precautions: Fall Precaution Comments: on 2L at baseline       Mobility Bed Mobility               General bed mobility comments: Pt up in chair  Transfers Overall transfer level: Needs assistance Equipment used: Rolling walker (2 wheeled) Transfers: Sit to/from Stand Sit to Stand: Min guard         General transfer comment: Assist for safety, lines    Balance Overall balance assessment: Needs assistance   Sitting balance-Leahy Scale: Good       Standing balance-Leahy Scale: Fair Standing balance comment: at sink                           ADL either performed or assessed with clinical judgement   ADL Overall ADL's : Needs assistance/impaired     Grooming: Wash/dry hands;Wash/dry face;Oral care;Sitting;Set up Grooming Details (indicate cue type and reason): at sink                 Toilet Transfer: Min guard;BSC;Stand-pivot   Toileting- Water quality scientist and Hygiene: Min guard;Sit to/from stand         General ADL Comments: pt only  able to tolerate one activity in standing at sink, completed 2 more in sitting     Vision       Perception     Praxis      Cognition Arousal/Alertness: Awake/alert Behavior During Therapy: WFL for tasks assessed/performed Overall Cognitive Status: Within Functional Limits for tasks assessed                                 General Comments: pt is [facility]        Exercises     Shoulder Instructions       General Comments      Pertinent Vitals/ Pain       Pain Assessment: No/denies pain  Home Living                                          Prior Functioning/Environment              Frequency  Min 2X/week        Progress Toward Goals  OT Goals(current goals can now be found in the care plan section)  Progress towards OT goals: Progressing toward goals  Acute Rehab OT Goals Patient Stated Goal: to get stronger  and feel better  OT Goal Formulation: With patient Time For Goal Achievement: 02/09/19 Potential to Achieve Goals: Good  Plan Discharge plan remains appropriate    Co-evaluation                 AM-PAC OT "6 Clicks" Daily Activity     Outcome Measure   Help from another person eating meals?: None Help from another person taking care of personal grooming?: A Little Help from another person toileting, which includes using toliet, bedpan, or urinal?: A Little Help from another person bathing (including washing, rinsing, drying)?: A Little Help from another person to put on and taking off regular upper body clothing?: A Little Help from another person to put on and taking off regular lower body clothing?: A Little 6 Click Score: 19    End of Session Equipment Utilized During Treatment: Gait belt;Rolling walker;Oxygen  OT Visit Diagnosis: Unsteadiness on feet (R26.81)   Activity Tolerance Patient limited by fatigue   Patient Left in chair;with call bell/phone within reach;Other (comment)([facility] RN in room)    Nurse Communication          Time: [phone] OT Time Calculation (min): 29 min  Charges: OT General Charges $OT Visit: 1 Visit OT Treatments $Self Care/Home Management : 23-37 mins  Malka So 01/29/2019, 11:36 AM Nestor Lewandowsky, OTR/L Acute Rehabilitation Services Pager: [phone] Office: [phone]

## 2019-01-30 ENCOUNTER — Non-Acute Institutional Stay (SKILLED_NURSING_FACILITY): Payer: Medicare Other | Admitting: Internal Medicine

## 2019-01-30 ENCOUNTER — Encounter: Payer: Self-pay | Admitting: Internal Medicine

## 2019-01-30 ENCOUNTER — Inpatient Hospital Stay (HOSPITAL_COMMUNITY): Payer: Medicare Other

## 2019-01-30 DIAGNOSIS — I5023 Acute on chronic systolic (congestive) heart failure: Secondary | ICD-10-CM

## 2019-01-30 DIAGNOSIS — D62 Acute posthemorrhagic anemia: Secondary | ICD-10-CM

## 2019-01-30 DIAGNOSIS — J449 Chronic obstructive pulmonary disease, unspecified: Secondary | ICD-10-CM

## 2019-01-30 DIAGNOSIS — R570 Cardiogenic shock: Secondary | ICD-10-CM

## 2019-01-30 DIAGNOSIS — B961 Klebsiella pneumoniae [K. pneumoniae] as the cause of diseases classified elsewhere: Secondary | ICD-10-CM

## 2019-01-30 DIAGNOSIS — B9689 Other specified bacterial agents as the cause of diseases classified elsewhere: Secondary | ICD-10-CM

## 2019-01-30 DIAGNOSIS — I493 Ventricular premature depolarization: Secondary | ICD-10-CM

## 2019-01-30 DIAGNOSIS — E8779 Other fluid overload: Secondary | ICD-10-CM

## 2019-01-30 DIAGNOSIS — R58 Hemorrhage, not elsewhere classified: Secondary | ICD-10-CM

## 2019-01-30 DIAGNOSIS — K219 Gastro-esophageal reflux disease without esophagitis: Secondary | ICD-10-CM

## 2019-01-30 DIAGNOSIS — N390 Urinary tract infection, site not specified: Secondary | ICD-10-CM

## 2019-01-30 DIAGNOSIS — I10 Essential (primary) hypertension: Secondary | ICD-10-CM

## 2019-01-30 NOTE — Progress Notes (Signed)
:  Location:  [facility] Room Number: 094B Place of Service:  SNF (31)  Khizar Fiorella D. Sheppard Coil, MD  Patient Care Team: [name]., MD as PCP - General (Internal Medicine)  Extended Emergency Contact Information Primary Emergency Contact: Marzette,Mozelle Address: [address] [place],  [address] [place] Phone: [phone] Relation: Spouse Secondary Emergency Contact: Pressman,Ege D.  [place] Phone: [phone] Relation: Son     Allergies: Patient has no known allergies.  Chief Complaint  Patient presents with  . New Admit To SNF    Admit to [facility]    HPI: Patient is 78 y.o. 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  Patient had been seen the day prior for similar symptoms, diagnosed with congestive heart failure exacerbation, feeling better after IV diuresis but then went home and symptoms got worse again.  Patient feels very short of breath despite staying on his home O2, and he has been using all of his diuretics as recommended.  He reports his legs are continue to swell over the last 2 weeks bilaterally.  Patient was admitted to [facility] from 1/26-2/26 for volume overload and cardiogenic shock.  Initially diuresed with IV Lasix but creatinine trended up.  PICC line was placed on 1/27 with CVP of 26.  Milrinone was started with improved diuresis and improve renal function.  Echo with ejection fraction of 10%.  Patient continued to diurese well and then developed a spontaneous retroperitoneal bleed with symptomatic anemia complicating  his admission.  Patient had been considered for LVAD, this was put on hold for patient to be rehab for several weeks on inotrope support and then reassess.  LV thrombus was not seen on TEE this admission, so anticoagulation was held.  Hospital course was initially complicated by Klebsiella pneumonia and a UTI treated with Rocephin.  Patient is admitted to skilled nursing facility for OT/PT.  While at skilled nursing facility patient will be followed for PVCs treated with amiodarone, GERD treated with Protonix, and hypertension treated with Spironolactone and Demadex.  Past Medical History:  Diagnosis Date  . AICD (automatic cardioverter/defibrillator) present   . Cancer of left kidney ([place])    S/P OR 05/2014  . CHF (congestive heart failure) ([place])   . CKD (chronic kidney disease)   . 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 Coronary artery disease   . DVT (deep venous thrombosis) (HCC) 1983   LLE  . GERD (gastroesophageal reflux disease)   . History of hiatal hernia   . Hypertension     Past Surgical History:  Procedure Laterality Date  . CARDIAC CATHETERIZATION  1980's  . IMPLANTABLE CARDIOVERTER DEFIBRILLATOR IMPLANT  07/21/2010  . IR FLUORO GUIDE CV LINE RIGHT  01/23/2019  . IR GENERIC HISTORICAL  12/23/2014   IR RADIOLOGIST EVAL & MGMT 12/23/2014 [name], MD [facility] INTERV RAD  . IR GENERIC HISTORICAL  07/20/2016   IR RADIOLOGIST EVAL & MGMT 07/20/2016 [facility] INTERV RAD  . IR RADIOLOGIST EVAL & MGMT  07/04/2017  . IR US GUIDE VASC  ACCESS RIGHT  01/23/2019  . RENAL CRYOABLATION Left 05/2014  . RIGHT/LEFT HEART CATH AND CORONARY ANGIOGRAPHY N/A 01/07/2019   Procedure: RIGHT/LEFT HEART CATH AND CORONARY ANGIOGRAPHY;  Surgeon: Jolaine Artist, MD;  Location: [facility] CV LAB;  Service: Cardiovascular;  Laterality: N/A;    Allergies as of 01/30/2019   No Known Allergies     Medication List       Accurate as of January 30, 2019  9:58 AM. Always  use your most recent med list.        acetaminophen 325 MG tablet Commonly known as:  TYLENOL Take 2 tablets (650 mg total) by mouth every 4 (four) hours as needed for headache or mild pain.   amiodarone 200 MG tablet Commonly known as:  PACERONE Take 200 mg by mouth daily.   benzonatate 100 MG capsule Commonly known as:  TESSALON Take 1 capsule (100 mg total) by mouth every 8 (eight) hours.   DULERA 200-5 MCG/ACT Aero Generic drug:  mometasone-formoterol Inhale 2 puffs into the lungs 2 (two) times daily.   lactobacillus Pack Take 1 packet (1 g total) by mouth 3 (three) times daily with meals.   milrinone 20 MG/100 ML Soln infusion Commonly known as:  PRIMACOR Inject 0.0205 mg/min into the vein continuous.   multivitamin with minerals Tabs tablet Take 1 tablet by mouth daily.   pantoprazole 40 MG tablet Commonly known as:  PROTONIX Take 1 tablet (40 mg total) by mouth daily.   polyethylene glycol packet Commonly known as:  MIRALAX / GLYCOLAX Take 17 g by mouth daily.   potassium chloride SA 20 MEQ tablet Commonly known as:  K-DUR,KLOR-CON Take 2 tablets (40 mEq total) by mouth daily.   spironolactone 25 MG tablet Commonly known as:  ALDACTONE Take 0.5 tablets (12.5 mg total) by mouth at bedtime.   sucralfate 1 g tablet Commonly known as:  CARAFATE Take 1 tablet (1 g total) by mouth 4 (four) times daily -  with meals and at bedtime.   tamsulosin 0.4 MG Caps capsule Commonly known as:  FLOMAX Take 2 capsules (0.8 mg total) by mouth daily after supper.   torsemide 20 MG tablet Commonly known as:  DEMADEX Take 2 tablets (40 mg total) by mouth 2 (two) times daily.       No orders of the defined types were placed in this encounter.    There is no immunization history on file for this patient.  Social History   Tobacco Use  . Smoking status: Former Smoker    Packs/day: 0.75    Years: 17.00    Pack years: 12.75    Types: Cigarettes    Start date:  05/08/1959    Last attempt to quit: 06/20/1976    Years since quitting: 42.6  . Smokeless tobacco: Never Used  Substance Use Topics  . Alcohol use: No    Family history is   Family History  Problem Relation Age of Onset  . Heart disease Father   . Heart attack Father 106  . Hypertension Father   . Hypertension Mother       Review of Systems  DATA OBTAINED: from patient GENERAL:  no fevers, fatigue, appetite changes SKIN: No itching, or rash EYES: No eye pain, redness, discharge EARS: No earache, tinnitus, change in hearing NOSE: No congestion, drainage or bleeding  MOUTH/THROAT: No mouth or tooth pain, No sore throat RESPIRATORY: No cough, wheezing, SOB CARDIAC: No chest pain, palpitations, lower extremity edema  GI: No  abdominal pain, No N/V/D or constipation, No heartburn or reflux  GU: No dysuria, frequency or urgency, or incontinence  MUSCULOSKELETAL: No unrelieved bone/joint pain NEUROLOGIC: No headache, dizziness or focal weakness PSYCHIATRIC: No c/o anxiety or sadness   Vitals:   01/30/19 0952  BP: (!) 104/58  Pulse: 64  Resp: 18  Temp: 99.6 F (37.6 C)    SpO2 Readings from Last 1 Encounters:  01/29/19 99%   Body mass index is 23.35 kg/m.     Physical Exam  GENERAL APPEARANCE: Alert, conversant,  No acute distress.  SKIN: No diaphoresis rash HEAD: Normocephalic, atraumatic  EYES: Conjunctiva/lids clear. Pupils round, reactive. EOMs intact.  EARS: External exam WNL, canals clear. Hearing grossly normal.  NOSE: No deformity or discharge.  MOUTH/THROAT: Lips w/o lesions  RESPIRATORY: Breathing is even, unlabored. Lung sounds are clear   CARDIOVASCULAR: Heart RRR 2/6 low pitched murmer, no rubs or gallops.  Trace peripheral edema.   GASTROINTESTINAL: Abdomen is soft, non-tender, not distended w/ normal bowel sounds. GENITOURINARY: Bladder non tender, not distended  MUSCULOSKELETAL: No abnormal joints or musculature NEUROLOGIC:  Cranial nerves 2-12  grossly intact. Moves all extremities  PSYCHIATRIC: Mood and affect appropriate to situation, no behavioral issues  Patient Active Problem List   Diagnosis Date Noted  . Weakness generalized   . Tachypnea   . Sinus tachycardia   . Hyponatremia   . Acute blood loss anemia   . DNR (do not resuscitate) discussion   . Palliative care by specialist   . Malnutrition of moderate degree 01/06/2019  . CKD (chronic kidney disease) stage 3, GFR 30-59 ml/min (HCC) 12/30/2018  . PVCs (premature ventricular contractions) 12/30/2018  . Acute on chronic systolic congestive heart failure ([place]) 12/29/2018  . CHF (congestive heart failure) ([place]) 05/03/2018  . Acute on chronic systolic (congestive) heart failure ([place]) 05/03/2018  . Glucose intolerance (impaired glucose tolerance) 06/23/2016  . Vitreous floaters 12/08/2015  . TIA (transient ischemic attack) 12/08/2015  . SOB (shortness of breath) 12/08/2015  . Posterior vitreous detachment of both eyes 12/08/2015  . Pain due to dental caries 12/08/2015  . Other abnormal glucose 12/08/2015  . Osteoarthrosis 12/08/2015  . Long term (current) use of anticoagulants 12/08/2015  . Kidney mass 12/08/2015  . Hypersomnolence 12/08/2015  . Hyperopia with presbyopia of both eyes 12/08/2015  . Hyperlipidemia 12/08/2015  . History of orthopnea 12/08/2015  . COPD (chronic obstructive pulmonary disease) ([place]) 12/08/2015  . 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 AKI (acute kidney injury) ([place]) 12/08/2015  . Abnormal CT scan, kidney 12/08/2015  . Abdominal bloating 12/08/2015  . Biventricular ICD (implantable cardioverter-defibrillator) in place 12/07/2015  . Chest pain at rest 11/29/2015  . Essential hypertension 11/29/2015  . Acute on chronic systolic CHF (congestive heart failure), NYHA class 1 ([place]) 11/29/2015  . Gastroesophageal reflux disease without esophagitis 11/29/2015  . Pain in the chest   . Renal cancer ([place]) 08/16/2015  . 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 Cancer of kidney ([place]) 01/19/2015  . Hematuria 09/04/2014  . Psychosexual dysfunction with inhibited sexual excitement 07/29/2014  . Pelvic pain in male 07/29/2014  . Increased urinary frequency 07/29/2014  . Heartburn 07/29/2014  . EKG, abnormal 07/29/2014  . Corneal scar 07/29/2014  . 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 Cardiomyopathy ([place]) 07/29/2014  . CAD (coronary artery disease) 07/29/2014  . Disorder of kidney and ureter 04/09/2014      Labs reviewed: Basic Metabolic Panel:    Component  Value Date/Time   NA 134 (L) 01/29/2019 0530   K 3.2 (L) 01/29/2019 0530   CL 96 (L) 01/29/2019 0530   CO2 28 01/29/2019 0530   GLUCOSE 159 (H) 01/29/2019 0530   BUN 43 (H) 01/29/2019 0530   CREATININE 1.41 (H) 01/29/2019 0530   CALCIUM 8.4 (L) 01/29/2019 0530   PROT 6.3 (L) 01/14/2019 0625   ALBUMIN 2.5 (L) 01/14/2019 0625   AST 54 (H) 01/14/2019 0625   ALT 22 01/14/2019 0625   ALKPHOS 74 01/14/2019 0625   BILITOT 5.3 (H) 01/14/2019 0625   GFRNONAA 48 (L) 01/29/2019 0530   GFRAA 55 (L) 01/29/2019 0530    Recent Labs    01/27/19 0343 01/28/19 0401 01/29/19 0530  NA 130* 132* 134*  K 4.4 3.8 3.2*  CL 94* 94* 96*  CO2 26 26 28   GLUCOSE 125* 117* 159*  BUN 42* 47* 43*  CREATININE 1.37* 1.41* 1.41*  CALCIUM 8.7* 8.8* 8.4*  MG 2.2 2.3 1.9   Liver Function Tests: Recent Labs    01/04/19 0546 01/06/19 1650 01/14/19 0625  AST 36 49* 54*  ALT 20 19 22   ALKPHOS 107 98 74  BILITOT 3.1* 2.8* 5.3*  PROT 6.8 6.3* 6.3*  ALBUMIN 2.7* 2.5* 2.5*   Recent Labs    05/03/18 1523 06/11/18 1740  LIPASE 42 42   Recent Labs    01/04/19 0547  AMMONIA 41*   CBC: Recent Labs    11/22/18 1509  12/29/18 1942  01/06/19 1940  01/27/19 0343 01/28/19 0401 01/29/19 0530  WBC 5.3   < > 5.7   < > 4.5   < > 8.2 7.6 7.2  NEUTROABS 3.6  --  4.0  --  2.9  --   --   --   --   HGB 13.5   < > 13.6   < > 12.0*   < > 10.3* 10.0* 9.5*    HCT 43.0   < > 43.1   < > 36.9*   < > 32.3* 31.6* 30.2*  MCV 80.1   < > 81.9   < > 80.9   < > 85.7 85.6 86.5  PLT 122*   < > 144*   < > 125*   < > 256 237 222   < > = values in this interval not displayed.   Lipid Recent Labs    01/06/19 1650  CHOL 98  HDL 46  LDLCALC 41  TRIG 57    Cardiac Enzymes: Recent Labs    12/04/18 1743 12/28/18 1821 12/29/18 1942  TROPONINI 0.06* 0.05* 0.05*   BNP: Recent Labs    12/04/18 1743 12/28/18 1821 12/29/18 1943  BNP >4,500.0* >4,925.0* >4,500.0*   No results found for: MICROALBUR Lab Results  Component Value Date   HGBA1C 6.8 (H) 01/06/2019   Lab Results  Component Value Date   TSH 0.906 01/06/2019   No results found for: VITAMINB12 No results found for: FOLATE No results found for: IRON, TIBC, FERRITIN  Imaging and Procedures obtained prior to SNF admission: Dg Chest 2 View  Result Date: 12/29/2018 CLINICAL DATA:  Worsening shortness of breath EXAM: CHEST - 2 VIEW COMPARISON:  12/28/2018. FINDINGS: Chronic enlargement of the cardiac silhouette. Pacemaker/AICD remains in place. Persistent right effusion with right base atelectasis. Pulmonary venous hypertension. IMPRESSION: Congestive heart failure with enlarged cardiac silhouette, venous hypertension, right effusion and right base atelectasis. Similar appearance to the study of yesterday. Electronically Signed   By: Jan Fireman.D.  On: 12/29/2018 20:55   Dg Chest Port 1 View  Result Date: 12/30/2018 CLINICAL DATA:  Malposition PICC pulled back and flushed. EXAM: PORTABLE CHEST 1 VIEW COMPARISON:  Earlier this day at 1904 hour FINDINGS: Right upper extremity PICC has been retracted, tip now appropriately position in the mid SVC, no longer looped. Unchanged cardiomegaly. Multi lead left-sided pacemaker in place. Unchanged right pleural effusion and associated basilar airspace disease. No pneumothorax. No new abnormalities. IMPRESSION: Right upper extremity PICC has been  retracted, tip now appropriately position in the mid SVC. Unchanged cardiomegaly and right pleural effusion with associated basilar atelectasis/airspace disease. Electronically Signed   By: Keith Rake M.D.   On: 12/30/2018 20:37   Dg Chest Port 1 View  Result Date: 12/30/2018 CLINICAL DATA:  78 year old male with a history of malpositioned PICC EXAM: PORTABLE CHEST 1 VIEW COMPARISON:  12/30/2018, 12/29/2018 FINDINGS: The cardiomediastinal silhouette is unchanged with cardiomegaly. Similar appearance of cardiac pacing device/AICD. There has been interval repositioning of the right upper extremity PICC. There is now a redundant course of the PICC, with 180 degree loop on the tip. Similar appearance of opacity at the right lung base with partial obscuration of the right hemidiaphragm. IMPRESSION: Interval repositioning of the right upper extremity PICC, which appears now redundant in the SVC. Persisting right pleural effusion and associated atelectasis/consolidation. Cardiomegaly. Unchanged cardiac AICD. Electronically Signed   By: Corrie Mckusick D.O.   On: 12/30/2018 19:24   Dg Chest Port 1 View  Result Date: 12/30/2018 CLINICAL DATA:  Peripherally inserted central catheter. EXAM: PORTABLE CHEST 1 VIEW COMPARISON:  12/29/2018 FINDINGS: 1752 hours. Cardiopericardial silhouette is markedly enlarged. Vascular congestion again noted with probable interstitial edema. There is right base atelectasis or infiltrate with small right pleural effusion, stable. Permanent pacemaker again noted. Right PICC line tip overlies the proximal SVC level. Catheter could be advanced 7.5 cm for tip positioning in the region of the SVC/RA junction. The visualized bony structures of the thorax are intact. IMPRESSION: Right PICC line tip overlies the proximal SVC level. Catheter could be advanced 7.5 cm for tip positioning in the region of the SVC/RA junction, as warranted. Marked enlargement of the cardiopericardial silhouette  with similar appearance of right base atelectasis and effusion. Electronically Signed   By: Misty Stanley M.D.   On: 12/30/2018 18:11   [place] Ekg Site Rite  Result Date: 12/30/2018 If Site Rite image not attached, placement could not be confirmed due to current cardiac rhythm.    Not all labs, radiology exams or other studies done during hospitalization come through on my EPIC note; however they are reviewed by me.    Assessment and Plan  Cardiogenic shock/volume overload- initially treated IV Lasix and when creatinine trended up PICC line was placed on 1/27 with a CVP of 26 and initial Choloxin 38% so milrinone was started with improved diuresis and renal function.  Lasix was transitioned to Lasix drip.  Echo on 01/01/2019 with an EF of 10% with severe MR/TR, biatrial enlargement, RV with moderately reduced function.  Taken for cath on 01/07/2019 with mild nonobstructive CAD, moderate PAH with normal PA PI and normal CO on milrinone 0.25 mcg/KG/minute.;  LVAD considered with a plan to move forward but patient had a spontaneous retroperitoneal bleed which put this on hold for several weeks after rehab to get patient stronger; anticoagulation was held SNF- patient is admitted for OT/PT; patient is admitted on milrinone drip 0.25 mcg/kg/min to be administered by advanced home care: Continue Demadex  40 mg twice daily  Spontaneous retroperitoneal bleed/acute blood loss anemia; anticoagulation was held-TEE did not show any LV thrombus this admission SNF- discharge hemoglobin 9.5; will follow-up CBC  Hypertension SNF- controlled; continue spironolactone 12.5 mg nightly and Demadex 40 mg twice daily  GERD SNF- not stated as uncontrolled; continue omeprazole 20 mg daily  History of PVCs SNF treated with amiodarone 200 mg daily  COPD SNF- continue Dulera 200-5 2 puffs in the lungs 2 times daily,  Klebsiella pneumoniae UTI-treated with Rocephin and treatment completed   Time spent greater than 45  minutes;> 50% of time with patient was spent reviewing records, labs, tests and studies, counseling and developing plan of care  Webb Silversmith D. Sheppard Coil, MD

## 2019-02-01 ENCOUNTER — Encounter: Payer: Self-pay | Admitting: Internal Medicine

## 2019-02-01 DIAGNOSIS — R570 Cardiogenic shock: Secondary | ICD-10-CM | POA: Insufficient documentation

## 2019-02-01 DIAGNOSIS — B961 Klebsiella pneumoniae [K. pneumoniae] as the cause of diseases classified elsewhere: Secondary | ICD-10-CM

## 2019-02-01 DIAGNOSIS — E8770 Fluid overload, unspecified: Secondary | ICD-10-CM | POA: Insufficient documentation

## 2019-02-01 DIAGNOSIS — B9689 Other specified bacterial agents as the cause of diseases classified elsewhere: Secondary | ICD-10-CM | POA: Insufficient documentation

## 2019-02-01 DIAGNOSIS — K683 Retroperitoneal hematoma: Secondary | ICD-10-CM | POA: Insufficient documentation

## 2019-02-01 DIAGNOSIS — R58 Hemorrhage, not elsewhere classified: Secondary | ICD-10-CM | POA: Insufficient documentation

## 2019-02-01 DIAGNOSIS — N390 Urinary tract infection, site not specified: Secondary | ICD-10-CM | POA: Insufficient documentation

## 2019-02-04 ENCOUNTER — Inpatient Hospital Stay (HOSPITAL_COMMUNITY): Payer: Medicare Other

## 2019-02-04 ENCOUNTER — Ambulatory Visit (HOSPITAL_COMMUNITY)
Admission: RE | Admit: 2019-02-04 | Discharge: 2019-02-04 | Disposition: A | Discharge status: Home / Self Care | Payer: Medicare Other | Source: Ambulatory Visit | Attending: Cardiology | Admitting: Cardiology

## 2019-02-04 VITALS — BP 96/52 | HR 80 | Wt 177.0 lb

## 2019-02-04 DIAGNOSIS — I5023 Acute on chronic systolic (congestive) heart failure: Secondary | ICD-10-CM | POA: Insufficient documentation

## 2019-02-04 DIAGNOSIS — I493 Ventricular premature depolarization: Secondary | ICD-10-CM | POA: Diagnosis not present

## 2019-02-04 DIAGNOSIS — I340 Nonrheumatic mitral (valve) insufficiency: Secondary | ICD-10-CM

## 2019-02-04 DIAGNOSIS — I5022 Chronic systolic (congestive) heart failure: Secondary | ICD-10-CM

## 2019-02-04 DIAGNOSIS — R58 Hemorrhage, not elsewhere classified: Secondary | ICD-10-CM

## 2019-02-04 DIAGNOSIS — N183 Chronic kidney disease, stage 3 unspecified: Secondary | ICD-10-CM

## 2019-02-04 DIAGNOSIS — I513 Intracardiac thrombosis, not elsewhere classified: Secondary | ICD-10-CM

## 2019-02-04 DIAGNOSIS — J449 Chronic obstructive pulmonary disease, unspecified: Secondary | ICD-10-CM

## 2019-02-04 LAB — COMPREHENSIVE METABOLIC PANEL
ALK PHOS: 109 U/L (ref 38–126)
ALT: 17 U/L (ref 0–44)
AST: 45 U/L — ABNORMAL HIGH (ref 15–41)
Albumin: 2.8 g/dL — ABNORMAL LOW (ref 3.5–5.0)
Anion gap: 6 (ref 5–15)
BUN: 37 mg/dL — ABNORMAL HIGH (ref 8–23)
CO2: 32 mmol/L (ref 22–32)
Calcium: 9.2 mg/dL (ref 8.9–10.3)
Chloride: 95 mmol/L — ABNORMAL LOW (ref 98–111)
Creatinine, Ser: 1.41 mg/dL — ABNORMAL HIGH (ref 0.61–1.24)
GFR calc Af Amer: 55 mL/min — ABNORMAL LOW (ref >60)
GFR calc non Af Amer: 48 mL/min — ABNORMAL LOW (ref >60)
Glucose, Bld: 88 mg/dL (ref 70–99)
Potassium: 4.3 mmol/L (ref 3.5–5.1)
Sodium: 133 mmol/L — ABNORMAL LOW (ref 135–145)
Total Bilirubin: 2.9 mg/dL — ABNORMAL HIGH (ref 0.3–1.2)
Total Protein: 7.3 g/dL (ref 6.5–8.1)

## 2019-02-04 LAB — CBC
HCT: 37.5 % — ABNORMAL LOW (ref 39.0–52.0)
Hemoglobin: 12 g/dL — ABNORMAL LOW (ref 13.0–17.0)
MCH: 27.4 pg (ref 26.0–34.0)
MCHC: 32 g/dL (ref 30.0–36.0)
MCV: 85.6 fL (ref 80.0–100.0)
Platelets: 179 10*3/uL (ref 150–400)
RBC: 4.38 MIL/uL (ref 4.22–5.81)
RDW: 20.5 % — ABNORMAL HIGH (ref 11.5–15.5)
WBC: 5.9 10*3/uL (ref 4.0–10.5)
nRBC: 0 % (ref 0.0–0.2)

## 2019-02-04 LAB — PREALBUMIN: Prealbumin: 20.2 mg/dL (ref 18–38)

## 2019-02-04 NOTE — Progress Notes (Signed)
Advanced Heart Failure Clinic Note   PCP: [name]., MD PCP-Cardiologist: Dr Haroldine Laws  HPI:  Spencer Rice is a 78 y.o. 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  Admitted 1/26-2/26/2020 with volume overload and cardiogenic shock. Initially diuresed with IV lasix, and Creatinine trended up. PICC placed 1/27 with initial CVP 26. Initial coox 38% -> Milrinone started with improved diuresis and renal function. Required lasix gtt. Echo 01/01/19 EF 10% with severe MR/TR, biatrial enlargement, RV with moderately reduced function. Taken for [facility] 01/07/19 with Mild, non-obstructive CAD, Moderate PAH with normal PAPI, and normal CO on milrinone 0.25 mcg/kg/min.   PFTs 01/10/2001 FVC 2.28 (54%), FEV 2.0 (64%), DLCO 14.64 (53%). Discussed for LVAD consideration at [facility], and had planned to move forward, but patient had spontaneous RP bleed with symptomatic anemia complicating his admission. VAD team plan is to rehab for several weeks on Inotrope support and re-assess for VAD. LV thrombus not seen on TEE this admission, so anticoagulation held in setting of RP bleed. He was also treated for UTI. He was transitioned to torsemide 40 mg BID. Med titration limited by soft BP. Discharged to SNF. Followed by [facility] for home inotropes. DC weight 177 lbs.   He returns today for post hospital follow up. Overall doing okay. He is at [employer] and is slowly getting stronger. Walking 60+ feet with walker. Gets SOB with walking, but not with ADLs as long as he goes slow. No orthopnea or PND. Occasional dry cough. He has BLE edema that comes throughout the day, but is gone in the mornings when he wakes up. Good UOP with torsemide. Appetite and energy level okay, not great. No CP or dizziness. Left groin is still sore. He has a bulge when he lies flat. No problems with PICC line. No drainage,  fever, or chills. Meds and meals through SNF. Weight today is unchanged from DC weight.   Cardiac Studies: [facility] 01/07/19  Ost RCA lesion is 50% stenosed.  Mid RCA lesion is 40% stenosed.  Prox LAD lesion is 50% stenosed.  Ost 2nd Diag to 2nd Diag lesion is 100% stenosed. Findings: Done on milrinone 0.25 mcg/kg/min RA = 7  RV = 62/8 PA = 65/26 (41) PCW = 19 Fick cardiac output/index = 5.6/2.7 PVR = 3.9 WU FA sat = 95% PA sat = 65%, 66% PaPI = 5.6 Assessment: 1. Mild non-obstructive CAD 2. Moderate PAH with normal PAPI 3. Normal CO on milrinone  ECHO 01/01/2019  EF 10% with severe MR, severe TR, Biatrial enlargement, RV moderately reduced function.   Review of systems complete and found to be negative unless listed in HPI.   Past Medical History:  Diagnosis Date  . AICD (automatic cardioverter/defibrillator) present   . Cancer of left kidney ([place])    S/P OR 05/2014  . CHF (congestive heart failure) ([place])   . CKD (chronic kidney disease)   . 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 Coronary artery disease   . DVT (deep venous thrombosis) ([facility]) 1983   LLE  . GERD (gastroesophageal reflux disease)   . History of hiatal hernia   . Hypertension     Current Outpatient Medications  Medication Sig Dispense Refill  . acetaminophen (TYLENOL) 325 MG tablet Take 2 tablets (650 mg total) by mouth every 4 (four) hours as needed for headache or mild pain.    Marland Kitchen amiodarone (  PACERONE) 200 MG tablet Take 200 mg by mouth daily.    . benzonatate (TESSALON) 100 MG capsule Take 1 capsule (100 mg total) by mouth every 8 (eight) hours. 21 capsule 0  . lactobacillus (FLORANEX/LACTINEX) PACK Take 1 packet (1 g total) by mouth 3 (three) times daily with meals.    . milrinone (PRIMACOR) 20 MG/100 ML SOLN infusion Inject 0.0205 mg/min into the vein continuous. 100 mL 0  . mometasone-formoterol (DULERA) 200-5 MCG/ACT AERO Inhale 2 puffs into the lungs 2 (two)  times daily.    . Multiple Vitamin (MULTIVITAMIN WITH MINERALS) TABS tablet Take 1 tablet by mouth daily.    . pantoprazole (PROTONIX) 40 MG tablet Take 1 tablet (40 mg total) by mouth daily. 30 tablet 5  . polyethylene glycol (MIRALAX / GLYCOLAX) packet Take 17 g by mouth daily. 14 each 0  . potassium chloride SA (K-DUR,KLOR-CON) 20 MEQ tablet Take 2 tablets (40 mEq total) by mouth daily. 60 tablet 5  . spironolactone (ALDACTONE) 25 MG tablet Take 0.5 tablets (12.5 mg total) by mouth at bedtime. 15 tablet 5  . sucralfate (CARAFATE) 1 g tablet Take 1 tablet (1 g total) by mouth 4 (four) times daily -  with meals and at bedtime. 28 tablet 0  . tamsulosin (FLOMAX) 0.4 MG CAPS capsule Take 2 capsules (0.8 mg total) by mouth daily after supper. 30 capsule 5  . torsemide (DEMADEX) 20 MG tablet Take 2 tablets (40 mg total) by mouth 2 (two) times daily. 120 tablet 5   No current facility-administered medications for this encounter.     No Known Allergies    Social History   Socioeconomic History  . Marital status: Married    Spouse name: Not on file  . Number of children: Not on file  . Years of education: Not on file  . Highest education level: Not on file  Occupational History  . Not on file  Social Needs  . Financial resource strain: Not on file  . Food insecurity:    Worry: Not on file    Inability: Not on file  . Transportation needs:    Medical: Not on file    Non-medical: Not on file  Tobacco Use  . Smoking status: Former Smoker    Packs/day: 0.75    Years: 17.00    Pack years: 12.75    Types: Cigarettes    Start date: 05/08/1959    Last attempt to quit: 06/20/1976    Years since quitting: 42.6  . Smokeless tobacco: Never Used  Substance and Sexual Activity  . Alcohol use: No  . Drug use: No  . Sexual activity: Not on file  Lifestyle  . Physical activity:    Days per week: Not on file    Minutes per session: Not on file  . Stress: Not on file  Relationships  .  Social connections:    Talks on phone: Not on file    Gets together: Not on file    Attends religious service: Not on file    Active member of club or organization: Not on file    Attends meetings of clubs or organizations: Not on file    Relationship status: Not on file  . Intimate partner violence:    Fear of current or ex partner: Not on file    Emotionally abused: Not on file    Physically abused: Not on file    Forced sexual activity: Not on file  Other Topics Concern  .  Not on file  Social History Narrative  . Not on file      Family History  Problem Relation Age of Onset  . Heart disease Father   . Heart attack Father 55  . Hypertension Father   . Hypertension Mother     Vitals:   02/04/19 1025  BP: (!) 96/52  Pulse: 80  SpO2: 100%  Weight: 80.3 kg (177 lb)   Wt Readings from Last 3 Encounters:  02/04/19 80.3 kg (177 lb)  01/30/19 80.3 kg (177 lb)  01/29/19 80.5 kg (177 lb 7.5 oz)    PHYSICAL EXAM: General: Appears fatigued. No respiratory difficulty. Arrived in wheelchair. HEENT: normal Neck: supple. JVP ~6. Carotids 2+ bilat; no bruits. No lymphadenopathy or thyromegaly appreciated. Cor: PMI nondisplaced. RRR, 2/6 MR/TR, +s3, Right chest tunneled PICC CDI.  Lungs: clear Abdomen: soft, nontender, nondistended. No hepatosplenomegaly. No bruits or masses. Good bowel sounds. Extremities: no cyanosis, clubbing, rash, BLE 2-3+ edema 3/4 up to knee.  Neuro: alert & oriented x 3, cranial nerves grossly intact. moves all 4 extremities w/o difficulty. Affect pleasant.    ASSESSMENT & PLAN:  1. Chronic Systolic Heart Failure -> recent cardiogenic shock: Due to primarily to nonischemic cardiomyopathy (out of proportion to CAD). [place] 2015 with moderate disease. Has Biotronik BiV ICD.  Initial CO-OX 38% so milrinone started on 1/28.  - ECHO 01/01/19 EF 10% with severe MR/TR, biatrial enlargement, RV with moderately reduced function.  - NYHA III. Volume status okay.  Has significant peripheral edema, but weight unchanged and JVP looks okay.  - Continue milrinone 0.25 mcg/kg/min. Managed through [facility].  - Continue torsemide 40 mg BID. Check BMET today.  - Continue spiro 12.5 mg daily - Add unna boots.  - No BB with inotrope dependence - No BP room to increase spiro or add ARB.  - Discussed at [place] on 2/10 and would be possible candidate once RP bleed improves.  - Dr Haroldine Laws discussed possibility of VAD again today if he gets stronger. Pt is unsure if he would want to go through with it, but wants to reassess once he is stronger.   2. RP bleed with symptomatic anemia - Spontaneous while on heparin (? Traumatic component). Was on heparin for h/o LV clot (no longer present) - Received 2U PRBCs on 2/10  - Off AC currently. Check CBC today.  - Still having left groin tenderness. Says he has a bulge when he lies flat.  3. CKD Stage III, Previously perceived creatinine baseline 1.8-2.4.   - BMET today  4. COPD on chronic home oxygen: PFTs 01/10/19 with restriction and obstruction. DLCO 53% (14.64) - No change.  5. L Kidney Cancer: Had cryoablation in 2015. No change.   6. PVCs: Started on amiodarone 2019 to suppress PVCs.  - Continue amiodarone 200 mg daily. TSH stable 01/2019. AST mildly elevated 01/14/19. ALT normal. Recheck LFTs today.   7. 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  - No change.   8. UTI, Klebsiella pneumoniae  - Completed rocephin course. Resolved.   9. Pulmonary nodule: 6 mm nodule in LUL noted on CT scan.  - Recommended 6 month repeat non contrast CT. Dr Haroldine Laws discussed with Dr Prescott Gum and okay to move forward with VAD workup. No change.   10. H/O [place] Thrombus - No thrombus on most recent echo - Off AC with RP hematoma.    11. Deconditioning - At SNF. Slowly getting stronger.  CMET, CBC Add unna  boots.  Follow up in 7-10 days with Dr Haroldine Laws  Georgiana Shore,  NP 02/04/19

## 2019-02-04 NOTE — Patient Instructions (Signed)
Labs drawn today. If they come back abnormal, we will call you. Otherwise, no news is good news.  Unna Boots to be put on at facility.

## 2019-02-05 LAB — CBC AND DIFFERENTIAL
HCT: 35 — AB (ref 41–53)
Hemoglobin: 11.4 — AB (ref 13.5–17.5)
Platelets: 166 (ref 150–399)
WBC: 5.2
WBC: 5.2

## 2019-02-05 LAB — BASIC METABOLIC PANEL
BUN: 38 — AB (ref 4–21)
Creatinine: 1.3 (ref 0.6–1.3)
Glucose: 77
POTASSIUM: 4.1 (ref 3.4–5.3)
Sodium: 137 (ref 137–147)

## 2019-02-06 ENCOUNTER — Emergency Department (HOSPITAL_COMMUNITY)
Admission: EM | Admit: 2019-02-06 | Discharge: 2019-02-07 | Disposition: A | Discharge status: Home / Self Care | Payer: Medicare Other | Attending: Emergency Medicine | Admitting: Emergency Medicine

## 2019-02-06 ENCOUNTER — Emergency Department (HOSPITAL_COMMUNITY): Payer: Medicare Other

## 2019-02-06 ENCOUNTER — Other Ambulatory Visit: Payer: Self-pay

## 2019-02-06 ENCOUNTER — Encounter (HOSPITAL_COMMUNITY): Payer: Self-pay

## 2019-02-06 ENCOUNTER — Non-Acute Institutional Stay (SKILLED_NURSING_FACILITY): Payer: Medicare Other | Admitting: Internal Medicine

## 2019-02-06 DIAGNOSIS — Z79899 Other long term (current) drug therapy: Secondary | ICD-10-CM | POA: Diagnosis not present

## 2019-02-06 DIAGNOSIS — J449 Chronic obstructive pulmonary disease, unspecified: Secondary | ICD-10-CM | POA: Insufficient documentation

## 2019-02-06 DIAGNOSIS — I2510 Atherosclerotic heart disease of native coronary artery without angina pectoris: Secondary | ICD-10-CM | POA: Diagnosis not present

## 2019-02-06 DIAGNOSIS — Z7689 Persons encountering health services in other specified circumstances: Secondary | ICD-10-CM

## 2019-02-06 DIAGNOSIS — N183 Chronic kidney disease, stage 3 (moderate): Secondary | ICD-10-CM | POA: Diagnosis not present

## 2019-02-06 DIAGNOSIS — Z85528 Personal history of other malignant neoplasm of kidney: Secondary | ICD-10-CM | POA: Diagnosis not present

## 2019-02-06 DIAGNOSIS — I5043 Acute on chronic combined systolic (congestive) and diastolic (congestive) heart failure: Secondary | ICD-10-CM | POA: Diagnosis not present

## 2019-02-06 DIAGNOSIS — I130 Hypertensive heart and chronic kidney disease with heart failure and stage 1 through stage 4 chronic kidney disease, or unspecified chronic kidney disease: Secondary | ICD-10-CM | POA: Insufficient documentation

## 2019-02-06 DIAGNOSIS — I5022 Chronic systolic (congestive) heart failure: Secondary | ICD-10-CM | POA: Diagnosis not present

## 2019-02-06 DIAGNOSIS — R0602 Shortness of breath: Secondary | ICD-10-CM

## 2019-02-06 DIAGNOSIS — Z87891 Personal history of nicotine dependence: Secondary | ICD-10-CM | POA: Diagnosis not present

## 2019-02-06 DIAGNOSIS — Z95810 Presence of automatic (implantable) cardiac defibrillator: Secondary | ICD-10-CM | POA: Insufficient documentation

## 2019-02-06 LAB — COMPREHENSIVE METABOLIC PANEL
ALBUMIN: 3 g/dL — AB (ref 3.5–5.0)
ALT: 18 U/L (ref 0–44)
ANION GAP: 5 (ref 5–15)
AST: 46 U/L — ABNORMAL HIGH (ref 15–41)
Alkaline Phosphatase: 118 U/L (ref 38–126)
BUN: 39 mg/dL — ABNORMAL HIGH (ref 8–23)
CO2: 25 mmol/L (ref 22–32)
Calcium: 8.8 mg/dL — ABNORMAL LOW (ref 8.9–10.3)
Chloride: 102 mmol/L (ref 98–111)
Creatinine, Ser: 1.66 mg/dL — ABNORMAL HIGH (ref 0.61–1.24)
GFR calc Af Amer: 45 mL/min — ABNORMAL LOW (ref >60)
GFR calc non Af Amer: 39 mL/min — ABNORMAL LOW (ref >60)
Glucose, Bld: 125 mg/dL — ABNORMAL HIGH (ref 70–99)
Potassium: 4.9 mmol/L (ref 3.5–5.1)
SODIUM: 132 mmol/L — AB (ref 135–145)
Total Bilirubin: 2.4 mg/dL — ABNORMAL HIGH (ref 0.3–1.2)
Total Protein: 7.8 g/dL (ref 6.5–8.1)

## 2019-02-06 LAB — APTT: aPTT: 28 seconds (ref 24–36)

## 2019-02-06 LAB — PROTIME-INR
INR: 1.3 — ABNORMAL HIGH (ref 0.8–1.2)
Prothrombin Time: 15.7 seconds — ABNORMAL HIGH (ref 11.4–15.2)

## 2019-02-06 LAB — CBC
HCT: 38.6 % — ABNORMAL LOW (ref 39.0–52.0)
Hemoglobin: 12.5 g/dL — ABNORMAL LOW (ref 13.0–17.0)
MCH: 27.9 pg (ref 26.0–34.0)
MCHC: 32.4 g/dL (ref 30.0–36.0)
MCV: 86.2 fL (ref 80.0–100.0)
Platelets: 167 10*3/uL (ref 150–400)
RBC: 4.48 MIL/uL (ref 4.22–5.81)
RDW: 20.7 % — ABNORMAL HIGH (ref 11.5–15.5)
WBC: 7.3 10*3/uL (ref 4.0–10.5)
nRBC: 0 % (ref 0.0–0.2)

## 2019-02-06 LAB — BRAIN NATRIURETIC PEPTIDE: B NATRIURETIC PEPTIDE 5: 2172.9 pg/mL — AB (ref 0.0–100.0)

## 2019-02-06 LAB — MAGNESIUM: Magnesium: 2.2 mg/dL (ref 1.7–2.4)

## 2019-02-06 MED ORDER — SUCRALFATE 1 G PO TABS: 1.0000 g | ORAL_TABLET | Freq: Three times a day (TID) | ORAL | Status: DC

## 2019-02-06 MED ORDER — POTASSIUM CHLORIDE CRYS ER 20 MEQ PO TBCR
40.0000 meq | EXTENDED_RELEASE_TABLET | Freq: Every day | ORAL | Status: DC
  Administered 2019-02-07: 40 meq via ORAL
  Filled 2019-02-06: qty 2

## 2019-02-06 MED ORDER — AMIODARONE HCL 200 MG PO TABS
200.0000 mg | ORAL_TABLET | Freq: Every day | ORAL | Status: DC
  Administered 2019-02-07: 200 mg via ORAL
  Filled 2019-02-06: qty 1

## 2019-02-06 MED ORDER — TORSEMIDE 20 MG PO TABS
40.0000 mg | ORAL_TABLET | Freq: Two times a day (BID) | ORAL | Status: DC
  Administered 2019-02-07: 40 mg via ORAL
  Filled 2019-02-06: qty 2

## 2019-02-06 MED ORDER — MILRINONE LACTATE IN DEXTROSE 20-5 MG/100ML-% IV SOLN
0.2500 ug/kg/min | INTRAVENOUS | Status: DC
  Administered 2019-02-06 – 2019-02-07 (×2): 0.25 ug/kg/min via INTRAVENOUS
  Filled 2019-02-06 (×3): qty 100

## 2019-02-06 MED ORDER — BENZONATATE 100 MG PO CAPS
100.0000 mg | ORAL_CAPSULE | Freq: Three times a day (TID) | ORAL | Status: DC
  Administered 2019-02-06: 100 mg via ORAL
  Filled 2019-02-06: qty 1

## 2019-02-06 MED ORDER — SPIRONOLACTONE 12.5 MG HALF TABLET
12.5000 mg | ORAL_TABLET | Freq: Every day | ORAL | Status: DC
  Administered 2019-02-06: 12.5 mg via ORAL
  Filled 2019-02-06 (×2): qty 1

## 2019-02-06 MED ORDER — PANTOPRAZOLE SODIUM 40 MG PO TBEC
40.0000 mg | DELAYED_RELEASE_TABLET | Freq: Every day | ORAL | Status: DC
  Administered 2019-02-07: 40 mg via ORAL
  Filled 2019-02-06: qty 1

## 2019-02-06 NOTE — Care Management (Addendum)
ED CM received a consult concerning patient who presented from [facility] with SOB.  Patient is on a milrinone drip with [facility] ([facility]), upon evaluation milrinone drip noted to be empty.   ED CM contacted [place] with [facility] (formerly [facility]) She was informed that the medication was delivered to the [facility] yesterday and the facility failed to notify [facility]). ED CM placed called to [phone]  Regarding the medication, and to find out when they could have someone deliver  medication to ED.  [facility] are aware and will restart Milrinone Drip pack once the its delivered CM awaiting a call back.  Updated Dr. Kathrynn Humble, Nursing Staff, and patient updated, patient verbalized understanding and is agreeable with plan.

## 2019-02-06 NOTE — ED Provider Notes (Signed)
[facility] EMERGENCY DEPARTMENT Provider Note   CSN: [id number] Arrival date & time: 02/06/19  1833    History   Chief Complaint No chief complaint on file.   HPI Spencer Rice is a 78 y.o. male.     HPI  78 year old male with history of advanced CHF with AICD placement and milrinone infusion, CKD comes in with chief complaint of shortness of breath. Patient states that he was admitted to the hospital for cardiac problems recently.  He was discharged last week and he was supposed to get milrinone sent to his rehab facility but that never occurred.  He was doing fine yesterday, however this afternoon he started having sudden onset of shortness of breath that has progressed.  Patient denies any associated chest pain, dizziness.  Past Medical History:  Diagnosis Date  . AICD (automatic cardioverter/defibrillator) present   . 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 CHF (congestive heart failure) ([place])   . CKD (chronic kidney disease)   . 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 Coronary artery disease   . DVT (deep venous thrombosis) (HCC) 1983   LLE  . GERD (gastroesophageal reflux disease)   . History of hiatal hernia   . Hypertension     Patient Active Problem List   Diagnosis Date Noted  . Cardiogenic shock ([place]) 02/01/2019  . Volume overload 02/01/2019  . Retroperitoneal bleed 02/01/2019  . Urinary tract infection due to Klebsiella species 02/01/2019  . Weakness generalized   . Tachypnea   . Sinus tachycardia   . Hyponatremia   . Acute blood loss anemia   . DNR (do not resuscitate) discussion   . Palliative care by specialist   . Malnutrition of moderate degree 01/06/2019  . CKD (chronic kidney disease) stage 3, GFR 30-59 ml/min (HCC) 12/30/2018  . PVCs (premature ventricular contractions) 12/30/2018  . Acute on chronic systolic congestive heart failure ([place]) 12/29/2018  . CHF (congestive heart  failure) ([place]) 05/03/2018  . Acute on chronic systolic (congestive) heart failure ([place]) 05/03/2018  . Glucose intolerance (impaired glucose tolerance) 06/23/2016  . Vitreous floaters 12/08/2015  . TIA (transient ischemic attack) 12/08/2015  . SOB (shortness of breath) 12/08/2015  . Posterior vitreous detachment of both eyes 12/08/2015  . Pain due to dental caries 12/08/2015  . Other abnormal glucose 12/08/2015  . Osteoarthrosis 12/08/2015  . Long term (current) use of anticoagulants 12/08/2015  . Kidney mass 12/08/2015  . Hypersomnolence 12/08/2015  . Hyperopia with presbyopia of both eyes 12/08/2015  . Hyperlipidemia 12/08/2015  . History of orthopnea 12/08/2015  . COPD (chronic obstructive pulmonary disease) ([place]) 12/08/2015  . 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 AKI (acute kidney injury) ([place]) 12/08/2015  . Abnormal CT scan, kidney 12/08/2015  . Abdominal bloating 12/08/2015  . Biventricular ICD (implantable cardioverter-defibrillator) in place 12/07/2015  . Chest pain at rest 11/29/2015  . Essential hypertension 11/29/2015  . Acute on chronic systolic CHF (congestive heart failure), NYHA class 1 ([place]) 11/29/2015  . Gastroesophageal reflux disease without esophagitis 11/29/2015  . Pain in the chest   . Renal cancer ([place]) 08/16/2015  . 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 Cancer of kidney ([place]) 01/19/2015  . Hematuria 09/04/2014  . Psychosexual dysfunction with inhibited sexual excitement 07/29/2014  . Pelvic pain in male 07/29/2014  . Increased urinary frequency 07/29/2014  . Heartburn 07/29/2014  . EKG, abnormal 07/29/2014  . Corneal  scar 07/29/2014  . 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 Cardiomyopathy ([place]) 07/29/2014  . CAD (coronary artery disease) 07/29/2014  . Disorder of kidney and ureter 04/09/2014    Past Surgical History:  Procedure Laterality Date  . CARDIAC CATHETERIZATION  1980's  . IMPLANTABLE CARDIOVERTER DEFIBRILLATOR  IMPLANT  07/21/2010  . IR FLUORO GUIDE CV LINE RIGHT  01/23/2019  . IR GENERIC HISTORICAL  12/23/2014   IR RADIOLOGIST EVAL & MGMT 12/23/2014 [name], MD [facility] INTERV RAD  . IR GENERIC HISTORICAL  07/20/2016   IR RADIOLOGIST EVAL & MGMT 07/20/2016 [facility] INTERV RAD  . IR RADIOLOGIST EVAL & MGMT  07/04/2017  . IR US GUIDE VASC ACCESS RIGHT  01/23/2019  . RENAL CRYOABLATION Left 05/2014  . RIGHT/LEFT HEART CATH AND CORONARY ANGIOGRAPHY N/A 01/07/2019   Procedure: RIGHT/LEFT HEART CATH AND CORONARY ANGIOGRAPHY;  Surgeon: Jolaine Artist, MD;  Location: [facility];  Service: Cardiovascular;  Laterality: N/A;        Home Medications    Prior to Admission medications   Medication Sig Start Date End Date Taking? Authorizing Provider  acetaminophen (TYLENOL) 325 MG tablet Take 2 tablets (650 mg total) by mouth every 4 (four) hours as needed for headache or mild pain. Patient taking differently: Take 650 mg by mouth every 6 (six) hours as needed for mild pain or headache.  01/29/19  Yes Tillery, Satira Mccallum, PA-C  amiodarone (PACERONE) 200 MG tablet Take 200 mg by mouth daily.   Yes [provider]  benzonatate (TESSALON) 100 MG capsule Take 1 capsule (100 mg total) by mouth every 8 (eight) hours. 08/15/17  Yes Palumbo, April, MD  lactobacillus (FLORANEX/LACTINEX) PACK Take 1 packet (1 g total) by mouth 3 (three) times daily with meals. 01/29/19  Yes Shirley Friar, PA-C  milrinone [facility]) 20 MG/100 ML SOLN infusion Inject 0.0205 mg/min into the vein continuous. 01/29/19  Yes Shirley Friar, PA-C  mometasone-formoterol (DULERA) 200-5 MCG/ACT AERO Inhale 2 puffs into the lungs 2 (two) times daily.   Yes [provider]  Multiple Vitamin (MULTIVITAMIN WITH MINERALS) TABS tablet Take 1 tablet by mouth daily.   Yes [provider]  Nutritional Supplements (FEEDING SUPPLEMENT, BOOST BREEZE,) LIQD Take 1 Can by mouth 2 (two) times daily.   Yes  [provider]  OXYGEN Inhale 2 L into the lungs continuous.   Yes [provider]  pantoprazole (PROTONIX) 40 MG tablet Take 1 tablet (40 mg total) by mouth daily. 01/29/19  Yes Shirley Friar, PA-C  polyethylene glycol [facility] / GLYCOLAX) packet Take 17 g by mouth daily. 01/29/19  Yes Shirley Friar, PA-C  potassium chloride SA (K-DUR,KLOR-CON) 20 MEQ tablet Take 2 tablets (40 mEq total) by mouth daily. 01/30/19  Yes Shirley Friar, PA-C  spironolactone (ALDACTONE) 25 MG tablet Take 0.5 tablets (12.5 mg total) by mouth at bedtime. 01/29/19  Yes Shirley Friar, PA-C  sucralfate (CARAFATE) 1 g tablet Take 1 tablet (1 g total) by mouth 4 (four) times daily -  with meals and at bedtime. 06/11/18  Yes Julianne Rice, MD  tamsulosin (FLOMAX) 0.4 MG CAPS capsule Take 2 capsules (0.8 mg total) by mouth daily after supper. 01/29/19  Yes Shirley Friar, PA-C  torsemide (DEMADEX) 20 MG tablet Take 2 tablets (40 mg total) by mouth 2 (two) times daily. 01/29/19  Yes Shirley Friar, PA-C    Family History Family History  Problem Relation Age of Onset  .  Heart disease Father   . Heart attack Father 13  . Hypertension Father   . Hypertension Mother     Social History Social History   Tobacco Use  . Smoking status: Former Smoker    Packs/day: 0.75    Years: 17.00    Pack years: 12.75    Types: Cigarettes    Start date: 05/08/1959    Last attempt to quit: 06/20/1976    Years since quitting: 42.6  . Smokeless tobacco: Never Used  Substance Use Topics  . Alcohol use: No  . Drug use: No     Allergies   Patient has no known allergies.   Review of Systems Review of Systems  Constitutional: Positive for activity change.  Respiratory: Positive for shortness of breath.   Cardiovascular: Negative for chest pain.  Gastrointestinal: Negative for nausea and vomiting.  Allergic/Immunologic: Negative for immunocompromised state.    Hematological: Bruises/bleeds easily.  All other systems reviewed and are negative.    Physical Exam Updated Vital Signs BP 102/70   Pulse 70   Temp 97.7 F (36.5 C) (Oral)   Resp 12   Ht 6' (1.829 m)   Wt 78.6 kg   SpO2 100%   BMI 23.49 kg/m   Physical Exam Vitals signs and nursing note reviewed.  Constitutional:      Appearance: He is well-developed.  HENT:     Head: Atraumatic.  Neck:     Musculoskeletal: Neck supple.  Cardiovascular:     Rate and Rhythm: Normal rate.  Pulmonary:     Effort: Pulmonary effort is normal.     Breath sounds: Rales present.     Comments: Bibasilar rales and positive JVD Musculoskeletal:     Comments: Patient has bilateral lower extremity wrapped in Ace wrap  Skin:    General: Skin is warm.  Neurological:     Mental Status: He is alert and oriented to person, place, and time.      ED Treatments / Results  Labs (all labs ordered are listed, but only abnormal results are displayed) Labs Reviewed  BRAIN NATRIURETIC PEPTIDE - Abnormal; Notable for the following components:      Result Value   B Natriuretic Peptide 2,172.9 (*)    All other components within normal limits  CBC - Abnormal; Notable for the following components:   Hemoglobin 12.5 (*)    HCT 38.6 (*)    RDW 20.7 (*)    All other components within normal limits  COMPREHENSIVE METABOLIC PANEL - Abnormal; Notable for the following components:   Sodium 132 (*)    Glucose, Bld 125 (*)    BUN 39 (*)    Creatinine, Ser 1.66 (*)    Calcium 8.8 (*)    Albumin 3.0 (*)    AST 46 (*)    Total Bilirubin 2.4 (*)    GFR calc non Af Amer 39 (*)    GFR calc Af Amer 45 (*)    All other components within normal limits  PROTIME-INR - Abnormal; Notable for the following components:   Prothrombin Time 15.7 (*)    INR 1.3 (*)    All other components within normal limits  MAGNESIUM  APTT    EKG EKG Interpretation  Date/Time:  Thursday February 06 2019 18:52:49 EST Ventricular  Rate:  86 PR Interval:    QRS Duration: 142 QT Interval:  426 QTC Calculation: 510 R Axis:   -75 Text Interpretation:  Sinus rhythm Prolonged PR interval Left atrial enlargement Nonspecific  IVCD with LAD LVH with secondary repolarization abnormality Inferior infarct, acute (LCx) Anterior infarct, old No significant change since last tracing Confirmed by Varney Biles [phone]) on 02/06/2019 7:26:16 PM   Radiology Dg Chest Portable 1 View  Result Date: 02/06/2019 CLINICAL DATA:  Awoke with shortness of breath. History of CHF. EXAM: PORTABLE CHEST 1 VIEW COMPARISON:  Radiograph 01/11/2019. FINDINGS: Marked cardiomegaly, unchanged from prior exam. Left-sided pacemaker remains in place. Right internal jugular central venous catheter tip in the lower SVC. Improved pulmonary edema from prior exam. Patchy infrahilar opacities may be atelectasis or residual pulmonary edema. Small right pleural effusion. No pneumothorax. IMPRESSION: 1. Improved pulmonary edema from prior exam 1 month ago. Patchy infrahilar opacities may be residual pulmonary edema or atelectasis. Small right pleural effusion, similar or improved. 2. Stable marked cardiomegaly. Electronically Signed   By: Keith Rake M.D.   On: 02/06/2019 19:14    Procedures .Critical Care Performed by: Varney Biles, MD Authorized by: Varney Biles, MD   Critical care provider statement:    Critical care time (minutes):  45   Critical care was necessary to treat or prevent imminent or life-threatening deterioration of the following conditions:  Cardiac failure   Critical care was time spent personally by me on the following activities:  Discussions with consultants, evaluation of patient's response to treatment, examination of patient, ordering and performing treatments and interventions, ordering and review of laboratory studies, ordering and review of radiographic studies, pulse oximetry, re-evaluation of patient's condition, obtaining history  from patient or surrogate and review of old charts   (including critical care time)  Medications Ordered in ED Medications  milrinone (PRIMACOR) 20 MG/100 ML (0.2 mg/mL) infusion (0.25 mcg/kg/min  82.1 kg Intravenous New Bag/Given 02/06/19 1945)  amiodarone (PACERONE) tablet 200 mg (has no administration in time range)  benzonatate (TESSALON) capsule 100 mg (has no administration in time range)  pantoprazole (PROTONIX) EC tablet 40 mg (has no administration in time range)  spironolactone (ALDACTONE) tablet 12.5 mg (has no administration in time range)  potassium chloride SA (K-DUR,KLOR-CON) CR tablet 40 mEq (has no administration in time range)  sucralfate (CARAFATE) tablet 1 g (has no administration in time range)  torsemide (DEMADEX) tablet 40 mg (has no administration in time range)     Initial Impression / Assessment and Plan / ED Course  I have reviewed the triage vital signs and the nursing notes.  Pertinent labs & imaging results that were available during my care of the patient were reviewed by me and considered in my medical decision making (see chart for details).        78 year old male with history of advanced CHF was on milrinone infusion, CKD, AICD placement comes in with chief complaint of shortness of breath.  It appears that he was supposed to get milrinone shipped to him yesterday at his rehab facility but that did not occur.  Her having sudden shortness of breath earlier this afternoon.  Currently he is tachypneic and has bibasilar rales with JVD.  He is not in respiratory distress or hypoxic.  We have started patient on milrinone drip through IV in the ER.  On reassessment he is feeling better already.  Patient was able to ambulate to the bathroom without significant problems.  Chest x-ray does not show concerning findings and the labs are overall reassuring.  I spoke with case management.  They have discussed the case with home health agency and it appears that there  indeed was some miscommunication that led  to this issue.  [facility] will come and see the patient in the ER and he will be discharged when his pump is filled with milrinone.  Patient has been made aware of the plan.  Final Clinical Impressions(s) / ED Diagnoses   Final diagnoses:  Acute on chronic combined systolic and diastolic CHF (congestive heart failure) [facility])    ED Discharge Orders    None       Varney Biles, MD 02/06/19 2301

## 2019-02-06 NOTE — Care Management (Signed)
ED CM received call from Nursing Staff medication was located at the facility, and has called her DON to make arrangement to have the medications delivered to the ED. CM updated Carolynn Sayers at [facility]. Patient and ED Staff made aware that patient will have to remain in the ED until the am when medication is delivered and restarted to get him back to [facility]. Patient and family verbalized understanding.

## 2019-02-06 NOTE — ED Triage Notes (Signed)
Per [facility]: From [facility] - woke up from nap and was short of breath, hx of CHF and COPD. Possibly needs an LVAD. Does have milrinone that is supposed to be running and is completely out. 18 L FA. Always wears 2 L [place].

## 2019-02-06 NOTE — ED Notes (Signed)
Pt ambulated to the restroom and back without difficulty. Gait steady and even. Oxygen saturations maintained 98% and above.

## 2019-02-07 ENCOUNTER — Other Ambulatory Visit: Payer: Self-pay

## 2019-02-07 NOTE — ED Provider Notes (Signed)
Pt stable overnight Resting comfortably No new complaints Plan to obtain milrinone drip then discharge back to [facility] Endorsed to dr Zenia Resides at shift change   Ripley Fraise, MD 02/07/19 [phone]

## 2019-02-07 NOTE — ED Notes (Signed)
Breakfast ordered heart healthy  

## 2019-02-07 NOTE — Discharge Planning (Signed)
EDCM spoke with Spencer Rice liaison for Milrinone gtts.  Pam restarted pump and pt is ready to return to SNF.

## 2019-02-07 NOTE — Discharge Instructions (Signed)
Please see the information and instructions below regarding your visit.  Your diagnoses today include:  1. Acute on chronic combined systolic and diastolic CHF (congestive heart failure) ([place])   2. Encounter for medication administration     Tests performed today include: See side panel of your discharge paperwork for testing performed today. Vital signs are listed at the bottom of these instructions.   Medications prescribed:    Take any prescribed medications only as prescribed, and any over the counter medications only as directed on the packaging.  Home care instructions:  Please follow any educational materials contained in this packet.   Follow-up instructions:   Return instructions:  Please return to the Emergency Department if you experience worsening symptoms.  Please return the emergency department if you develop any chest pain, shortness of breath, or further complications with your milrinone.  Please return if you have any other emergent concerns.  Additional Information:   Your vital signs today were: BP 104/74    Pulse 74    Temp 97.7 F (36.5 C) (Oral)    Resp 20    Ht 6' (1.829 m)    Wt 78.6 kg    SpO2 100%    BMI 23.49 kg/m  If your blood pressure (BP) was elevated on multiple readings during this visit above 130 for the top number or above 80 for the bottom number, please have this repeated by your primary care provider within one month. --------------  Thank you for allowing [place] to participate in your care today.

## 2019-02-07 NOTE — ED Provider Notes (Signed)
Default provider note:   Patient is a 78 year old male with a history of advanced heart failure, AICD in place, CKD stage III presenting for running out of his milrinone drip.  He is at an [facility].  He [facility]one drip [facility], however there was a lapse in delivering the medication per the patient.  He has been medically cleared by previous providers.  Today he reports he has no chest pain, shortness of breath or other symptoms.  [facility] came out to the emergency department and set up patient's home milrinone drip.  He is ready for discharge back to his skilled nursing facility.  On my exam today, patient resting comfortably.  Coarse crackles in bilateral lung bases.  No increased work of breathing.  Case discussed with Dr. Vivi Martens who is following the case.  Agrees stable for d/c.   Case management has been following case, [facility] has been contacted, and they will continue to provide care.  Will call [facility].    Albesa Seen, PA-C 02/07/19 1424    Lacretia Leigh, MD 02/10/19 1309

## 2019-02-07 NOTE — ED Notes (Signed)
Lunch ordered 

## 2019-02-08 ENCOUNTER — Encounter: Payer: Self-pay | Admitting: Internal Medicine

## 2019-02-08 DIAGNOSIS — I5042 Chronic combined systolic (congestive) and diastolic (congestive) heart failure: Secondary | ICD-10-CM | POA: Insufficient documentation

## 2019-02-08 DIAGNOSIS — I5022 Chronic systolic (congestive) heart failure: Secondary | ICD-10-CM | POA: Insufficient documentation

## 2019-02-08 NOTE — Progress Notes (Signed)
Location:  [facility] of Service:  SNF [phone])  Provider: Hennie Duos MD  [name]., MD  Patient Care Team: [name]., MD as PCP - General (Internal Medicine)  Extended Emergency Contact Information Primary Emergency Contact: Ozimek,Mozelle Address: [address] [place], [place] [address] [place] Phone: [phone] Relation: Spouse Secondary Emergency Contact: Bonfield,Kohle D.  [place] [employer] Phone: [phone] Relation: Son    Allergies: Patient has no known allergies.  Chief Complaint  Patient presents with  . Acute Visit    HPI: Patient is 78 y.o. male who is being seen for shortness of breath.  Patient was admitted to SNF with an ejection fraction of 10% and on a chronic milrinone drip.  Patient's O2 saturation is 99% on 2L [place], patient has a rapid respiratory rate patient's lungs are clear, patient is drip is hooked up and the machine is not beeping.  Patient admits to shortness of breath but denies chest pain.  Onset of symptoms was while sitting, he says is worse when he gets up and walks had been ongoing for only a few minutes before nurses were called.  Past Medical History:  Diagnosis Date  . AICD (automatic cardioverter/defibrillator) present   . Cancer of left kidney ([place])    S/P OR 05/2014  . CHF (congestive heart failure) ([place])   . CKD (chronic kidney disease)   . 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 Coronary artery disease   . DVT (deep venous thrombosis) (HCC) 1983   LLE  . GERD (gastroesophageal reflux disease)   . History of hiatal hernia   . Hypertension     Past Surgical History:  Procedure Laterality Date  . CARDIAC CATHETERIZATION  1980's  . IMPLANTABLE CARDIOVERTER DEFIBRILLATOR IMPLANT  07/21/2010  . IR FLUORO GUIDE CV LINE RIGHT  01/23/2019  . IR GENERIC HISTORICAL  12/23/2014   IR RADIOLOGIST EVAL & MGMT  12/23/2014 [name], MD [facility] INTERV RAD  . IR GENERIC HISTORICAL  07/20/2016   IR RADIOLOGIST EVAL & MGMT 07/20/2016 [facility] INTERV RAD  . IR RADIOLOGIST EVAL & MGMT  07/04/2017  . IR US GUIDE VASC ACCESS RIGHT  01/23/2019  . RENAL CRYOABLATION Left 05/2014  . RIGHT/LEFT HEART CATH AND CORONARY ANGIOGRAPHY N/A 01/07/2019   Procedure: RIGHT/LEFT HEART CATH AND CORONARY ANGIOGRAPHY;  Surgeon: Jolaine Artist, MD;  Location: [facility];  Service: Cardiovascular;  Laterality: N/A;    Allergies as of 02/06/2019   No Known Allergies     Medication List       Accurate as of February 06, 2019 11:59 PM. Always use your most recent med list.        acetaminophen 325 MG tablet Commonly known as:  TYLENOL Take 2 tablets (650 mg total) by mouth every 4 (four) hours as needed for headache or mild pain.   amiodarone 200 MG tablet Commonly known as:  PACERONE Take 200 mg by mouth daily.   benzonatate 100 MG capsule Commonly known as:  TESSALON Take 1 capsule (100 mg total) by mouth every 8 (eight) hours.   Dulera 200-5 MCG/ACT Aero Generic drug:  mometasone-formoterol Inhale 2 puffs into the lungs 2 (two) times daily.   feeding supplement (BOOST BREEZE) Liqd Take 1 Can by mouth 2 (two) times daily.   lactobacillus Pack Take 1 packet (1  g total) by mouth 3 (three) times daily with meals.   milrinone 20 MG/100 ML Soln infusion Commonly known as:  PRIMACOR Inject 0.0205 mg/min into the vein continuous.   multivitamin with minerals Tabs tablet Take 1 tablet by mouth daily.   OXYGEN Inhale 2 L into the lungs continuous.   pantoprazole 40 MG tablet Commonly known as:  PROTONIX Take 1 tablet (40 mg total) by mouth daily.   polyethylene glycol packet Commonly known as:  MIRALAX / GLYCOLAX Take 17 g by mouth daily.   potassium chloride SA 20 MEQ tablet Commonly known as:  K-DUR,KLOR-CON Take 2 tablets (40 mEq total) by mouth daily.   spironolactone 25 MG tablet Commonly  known as:  ALDACTONE Take 0.5 tablets (12.5 mg total) by mouth at bedtime.   sucralfate 1 g tablet Commonly known as:  Carafate Take 1 tablet (1 g total) by mouth 4 (four) times daily -  with meals and at bedtime.   tamsulosin 0.4 MG Caps capsule Commonly known as:  FLOMAX Take 2 capsules (0.8 mg total) by mouth daily after supper.   torsemide 20 MG tablet Commonly known as:  DEMADEX Take 2 tablets (40 mg total) by mouth 2 (two) times daily.       No orders of the defined types were placed in this encounter.    There is no immunization history on file for this patient.  Social History   Tobacco Use  . Smoking status: Former Smoker    Packs/day: 0.75    Years: 17.00    Pack years: 12.75    Types: Cigarettes    Start date: 05/08/1959    Last attempt to quit: 06/20/1976    Years since quitting: 42.6  . Smokeless tobacco: Never Used  Substance Use Topics  . Alcohol use: No    Review of Systems  DATA OBTAINED: from patient, nurse GENERAL:  no fevers, fatigue, appetite changes SKIN: No itching, rash HEENT: No complaint RESPIRATORY: No cough, wheezing, +SOB CARDIAC: No chest pain, palpitations, lower extremity edema  GI: No abdominal pain, No N/V/D or constipation, No heartburn or reflux  GU: No dysuria, frequency or urgency, or incontinence  MUSCULOSKELETAL: No unrelieved bone/joint pain NEUROLOGIC: No headache, dizziness  PSYCHIATRIC: No overt anxiety or sadness  Vitals:   02/08/19 1654  BP: 117/80  Pulse: 89  Resp: 20  Temp: 98 F (36.7 C)   There is no height or weight on file to calculate BMI. Physical Exam  GENERAL APPEARANCE: Alert, conversant, No acute distress  SKIN: No diaphoresis rash HEENT: Unremarkable  RESPIRATORY: Breathing is even, unlabored.  Increased respiratory rate; lung sounds are clear   CARDIOVASCULAR: Heart RRR no murmurs, rubs or gallops. No peripheral edema  GASTROINTESTINAL: Abdomen is soft, non-tender, not distended w/ normal  bowel sounds.  GENITOURINARY: Bladder non tender, not distended  MUSCULOSKELETAL: No abnormal joints or musculature NEUROLOGIC: Cranial nerves 2-12 grossly intact. Moves all extremities PSYCHIATRIC: Mood and affect appropriate to situation, no behavioral issues  Patient Active Problem List   Diagnosis Date Noted  . Cardiogenic shock ([place]) 02/01/2019  . Volume overload 02/01/2019  . Retroperitoneal bleed 02/01/2019  . Urinary tract infection due to Klebsiella species 02/01/2019  . Weakness generalized   . Tachypnea   . Sinus tachycardia   . Hyponatremia   . Acute blood loss anemia   . DNR (do not resuscitate) discussion   . Palliative care by specialist   . Malnutrition of moderate degree 01/06/2019  . CKD (chronic  kidney disease) stage 3, GFR 30-59 ml/min (HCC) 12/30/2018  . PVCs (premature ventricular contractions) 12/30/2018  . Acute on chronic systolic congestive heart failure ([place]) 12/29/2018  . CHF (congestive heart failure) ([place]) 05/03/2018  . Acute on chronic systolic (congestive) heart failure ([place]) 05/03/2018  . Glucose intolerance (impaired glucose tolerance) 06/23/2016  . Vitreous floaters 12/08/2015  . TIA (transient ischemic attack) 12/08/2015  . SOB (shortness of breath) 12/08/2015  . Posterior vitreous detachment of both eyes 12/08/2015  . Pain due to dental caries 12/08/2015  . Other abnormal glucose 12/08/2015  . Osteoarthrosis 12/08/2015  . Long term (current) use of anticoagulants 12/08/2015  . Kidney mass 12/08/2015  . Hypersomnolence 12/08/2015  . Hyperopia with presbyopia of both eyes 12/08/2015  . Hyperlipidemia 12/08/2015  . History of orthopnea 12/08/2015  . COPD (chronic obstructive pulmonary disease) ([place]) 12/08/2015  . 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 AKI (acute kidney injury) ([place]) 12/08/2015  . Abnormal CT scan, kidney 12/08/2015  . Abdominal bloating 12/08/2015  . Biventricular ICD (implantable cardioverter-defibrillator) in  place 12/07/2015  . Chest pain at rest 11/29/2015  . Essential hypertension 11/29/2015  . Acute on chronic systolic CHF (congestive heart failure), NYHA class 1 ([place]) 11/29/2015  . Gastroesophageal reflux disease without esophagitis 11/29/2015  . Pain in the chest   . Renal cancer ([place]) 08/16/2015  . 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 Cancer of kidney ([place]) 01/19/2015  . Hematuria 09/04/2014  . Psychosexual dysfunction with inhibited sexual excitement 07/29/2014  . Pelvic pain in male 07/29/2014  . Increased urinary frequency 07/29/2014  . Heartburn 07/29/2014  . EKG, abnormal 07/29/2014  . Corneal scar 07/29/2014  . 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 Cardiomyopathy ([place]) 07/29/2014  . CAD (coronary artery disease) 07/29/2014  . Disorder of kidney and ureter 04/09/2014    CMP     Component Value Date/Time   NA 132 (L) 02/06/2019 1850   NA 137 02/05/2019   K 4.9 02/06/2019 1850   CL 102 02/06/2019 1850   CO2 25 02/06/2019 1850   GLUCOSE 125 (H) 02/06/2019 1850   BUN 39 (H) 02/06/2019 1850   BUN 38 (A) 02/05/2019   CREATININE 1.66 (H) 02/06/2019 1850   CALCIUM 8.8 (L) 02/06/2019 1850   PROT 7.8 02/06/2019 1850   ALBUMIN 3.0 (L) 02/06/2019 1850   AST 46 (H) 02/06/2019 1850   ALT 18 02/06/2019 1850   ALKPHOS 118 02/06/2019 1850   BILITOT 2.4 (H) 02/06/2019 1850   GFRNONAA 39 (L) 02/06/2019 1850   GFRAA 45 (L) 02/06/2019 1850   Recent Labs    01/28/19 0401 01/29/19 0530 02/04/19 1112 02/05/19 02/06/19 1850  NA 132* 134* 133* 137 132*  K 3.8 3.2* 4.3 4.1 4.9  CL 94* 96* 95*  --  102  CO2 26 28 32  --  25  GLUCOSE 117* 159* 88  --  125*  BUN 47* 43* 37* 38* 39*  CREATININE 1.41* 1.41* 1.41* 1.3 1.66*  CALCIUM 8.8* 8.4* 9.2  --  8.8*  MG 2.3 1.9  --   --  2.2   Recent Labs    01/14/19 0625 02/04/19 1112 02/06/19 1850  AST 54* 45* 46*  ALT 22 17 18   ALKPHOS 74 109 118  BILITOT 5.3* 2.9* 2.4*  PROT 6.3* 7.3 7.8  ALBUMIN 2.5* 2.8* 3.0*    Recent Labs    11/22/18 1509  12/29/18 1942  01/06/19 1940  01/29/19 0530 02/04/19 1112 02/05/19  02/06/19 1850  WBC 5.3   < > 5.7   < > 4.5   < > 7.2 5.9 5.2  5.2 7.3  NEUTROABS 3.6  --  4.0  --  2.9  --   --   --   --   --   HGB 13.5   < > 13.6   < > 12.0*   < > 9.5* 12.0* 11.4* 12.5*  HCT 43.0   < > 43.1   < > 36.9*   < > 30.2* 37.5* 35* 38.6*  MCV 80.1   < > 81.9   < > 80.9   < > 86.5 85.6  --  86.2  PLT 122*   < > 144*   < > 125*   < > 222 179 166 167   < > = values in this interval not displayed.   Recent Labs    01/06/19 1650  CHOL 98  LDLCALC 41  TRIG 57   No results found for: [facility] Lab Results  Component Value Date   TSH 0.906 01/06/2019   Lab Results  Component Value Date   HGBA1C 6.8 (H) 01/06/2019   Lab Results  Component Value Date   CHOL 98 01/06/2019   HDL 46 01/06/2019   LDLCALC 41 01/06/2019   TRIG 57 01/06/2019   CHOLHDL 2.1 01/06/2019    Significant Diagnostic Results in last 30 days:  Ct Abdomen Pelvis Wo Contrast  Result Date: 01/13/2019 CLINICAL DATA:  LEFT side abdominal pain EXAM: CT ABDOMEN AND PELVIS WITHOUT CONTRAST TECHNIQUE: Multidetector CT imaging of the abdomen and pelvis was performed following the standard protocol without IV contrast. COMPARISON:  CT 01/06/2019 FINDINGS: Lower chest: Mild ground-glass opacity in the RIGHT middle lobe suggest pulmonary edema. Small focus consolidation in the LEFT lower lobe represents atelectasis or infiltrate. Hepatobiliary: No focal hepatic lesion on noncontrast exam. Gallbladder normal. Pancreas: Pancreas is normal. No ductal dilatation. No pancreatic inflammation. Spleen: Normal spleen Adrenals/urinary tract: Adrenal glands normal. Ablation site in the LEFT mid kidney noted. Simple cyst in the LEFT kidney again noted. No hydronephrosis or hydroureter. Stomach/Bowel: Stomach, small bowel, appendix, and cecum are normal. The colon and rectosigmoid colon are normal. Vascular/Lymphatic: Abdominal  aorta is normal. There is intimal calcification. No lymphadenopathy Reproductive: Prostate normal Other: New large fluid collection with fluid fluid level within the LEFT retroperitoneal space extending along the psoas muscle and into the inguinal canal. Collection measures 9.4 x 8.6 by 16.0 cm (volume = 680 cm^3). Findings most consistent within acute/subacute retroperitoneal hematoma. Hematoma elevates the LEFT kidney. Dystrophic calcification again noted the LEFT psoas muscle. Musculoskeletal: No aggressive osseous lesion. No evidence of fracture. IMPRESSION: 1. Interval development of a large LEFT retroperitoneal hematoma new from CT 01/06/2019 with an estimated volume of 680 cubic cm. Hematoma extends deep to the LEFT pararenal space from the level of the superior pole of the LEFT kidney to the LEFT iliac fossa and into a LEFT inguinal hernia. Recommend physical examination of the LEFT hemiscrotum. 2. Mild ground-glass opacities in the RIGHT middle lobe suggest mild pulmonary edema. Small focus of atelectasis or infiltrate in the LEFT lower lobe. Findings conveyed toASHLEY SMITH on 01/13/2019  at12:26. Electronically Signed   By: Suzy Bouchard M.D.   On: 01/13/2019 12:29   Ir Fluoro Guide Cv Line Right  Result Date: 01/23/2019 CLINICAL DATA:  CHF with need of durable venous access for IV therapy EXAM: TUNNELED CENTRAL VENOUS CATHETER PLACEMENT WITH ULTRASOUND AND FLUOROSCOPIC GUIDANCE TECHNIQUE: The  procedure, risks, benefits, and alternatives were explained to the patient. Questions regarding the procedure were encouraged and answered. The patient understands and consents to the procedure. Patency of the right IJ vein was confirmed with ultrasound with image documentation. An appropriate skin site was determined. Region was prepped using maximum barrier technique including cap and mask, sterile gown, sterile gloves, large sterile sheet, and Chlorhexidine as cutaneous antisepsis. The region was  infiltrated locally with 1% lidocaine. Under real-time ultrasound guidance, the right IJ vein was accessed with a 21 gauge micropuncture needle; the needle tip within the vein was confirmed with ultrasound image documentation. 29F single-lumen cuffed powerPICC tunneled from a right anterior chest wall approach to the dermatotomy site. Needle exchanged over the 018 guidewire for transitional dilator, through which the catheter which had been cut to 27 cm was advanced under intermittent fluoroscopy, positioned with its tip at the cavoatrial junction. Spot chest radiograph confirms good catheter position. No pneumothorax. Catheter was flushed per protocol. Catheter secured externally with O Prolene suture. The right IJ dermatotomy site was closed with Dermabond. COMPLICATIONS: COMPLICATIONS None immediate FLUOROSCOPY TIME:  12 seconds; 1 mGy COMPARISON:  None IMPRESSION: 1. Technically successful placement of tunneled right IJ tunneled single-lumen power injectable catheter with ultrasound and fluoroscopic guidance. Ready for routine use. Electronically Signed   By: Lucrezia Europe M.D.   On: 01/23/2019 13:24   Ir US Guide Vasc Access Right  Result Date: 01/23/2019 CLINICAL DATA:  CHF with need of durable venous access for IV therapy EXAM: TUNNELED CENTRAL VENOUS CATHETER PLACEMENT WITH ULTRASOUND AND FLUOROSCOPIC GUIDANCE TECHNIQUE: The procedure, risks, benefits, and alternatives were explained to the patient. Questions regarding the procedure were encouraged and answered. The patient understands and consents to the procedure. Patency of the right IJ vein was confirmed with ultrasound with image documentation. An appropriate skin site was determined. Region was prepped using maximum barrier technique including cap and mask, sterile gown, sterile gloves, large sterile sheet, and Chlorhexidine as cutaneous antisepsis. The region was infiltrated locally with 1% lidocaine. Under real-time ultrasound guidance, the right IJ  vein was accessed with a 21 gauge micropuncture needle; the needle tip within the vein was confirmed with ultrasound image documentation. 29F single-lumen cuffed powerPICC tunneled from a right anterior chest wall approach to the dermatotomy site. Needle exchanged over the 018 guidewire for transitional dilator, through which the catheter which had been cut to 27 cm was advanced under intermittent fluoroscopy, positioned with its tip at the cavoatrial junction. Spot chest radiograph confirms good catheter position. No pneumothorax. Catheter was flushed per protocol. Catheter secured externally with O Prolene suture. The right IJ dermatotomy site was closed with Dermabond. COMPLICATIONS: COMPLICATIONS None immediate FLUOROSCOPY TIME:  12 seconds; 1 mGy COMPARISON:  None IMPRESSION: 1. Technically successful placement of tunneled right IJ tunneled single-lumen power injectable catheter with ultrasound and fluoroscopic guidance. Ready for routine use. Electronically Signed   By: Lucrezia Europe M.D.   On: 01/23/2019 13:24   Dg Chest Portable 1 View  Result Date: 02/06/2019 CLINICAL DATA:  Awoke with shortness of breath. History of CHF. EXAM: PORTABLE CHEST 1 VIEW COMPARISON:  Radiograph 01/11/2019. FINDINGS: Marked cardiomegaly, unchanged from prior exam. Left-sided pacemaker remains in place. Right internal jugular central venous catheter tip in the lower SVC. Improved pulmonary edema from prior exam. Patchy infrahilar opacities may be atelectasis or residual pulmonary edema. Small right pleural effusion. No pneumothorax. IMPRESSION: 1. Improved pulmonary edema from prior exam 1 month ago. Patchy infrahilar opacities may be  residual pulmonary edema or atelectasis. Small right pleural effusion, similar or improved. 2. Stable marked cardiomegaly. Electronically Signed   By: Keith Rake M.D.   On: 02/06/2019 19:14   Dg Chest Port 1 View  Result Date: 01/11/2019 CLINICAL DATA:  78 year old male with shortness of  breath EXAM: PORTABLE CHEST 1 VIEW COMPARISON:  Prior chest x-ray 01/06/2019 FINDINGS: Stable marked cardiomegaly. Left subclavian approach biventricular cardiac rhythm maintenance device with leads projecting over the right atrium, right ventricle and overlying the left ventricle. Right upper extremity PICC is present. Catheter tip well positioned at the superior cavoatrial junction. Atherosclerotic calcifications again noted in the transverse aorta. Slightly increased pulmonary vascular congestion with diffuse mild interstitial and airspace opacities consistent with mild pulmonary edema. Small right pleural effusion. No pneumothorax. IMPRESSION: 1. Mild CHF with interstitial pulmonary edema. 2. Stable massive cardiomegaly. 3. Small layering right pleural effusion. 4. Well-positioned right upper extremity PICC. Electronically Signed   By: Jacqulynn Cadet M.D.   On: 01/11/2019 10:00   Vas US Doppler Pre Vad  Result Date: 01/19/2019 PERIOPERATIVE VASCULAR EVALUATION Indications: Pre Op. Performing Technologist: June Leap Rdms, Rvt  Examination Guidelines: A complete evaluation includes B-mode imaging, spectral Doppler, color Doppler, and power Doppler as needed of all accessible portions of each vessel. Bilateral testing is considered an integral part of a complete examination. Limited examinations for reoccurring indications may be performed as noted.  Right Carotid Findings: +----------+--------+--------+--------+---------------------+--------+           PSV cm/sEDV cm/sStenosisDescribe             Comments +----------+--------+--------+--------+---------------------+--------+ CCA Prox  71      17                                            +----------+--------+--------+--------+---------------------+--------+ CCA Distal32      11              focal and hyperechoic         +----------+--------+--------+--------+---------------------+--------+ ICA Prox  39      12                                             +----------+--------+--------+--------+---------------------+--------+ ICA Mid   58      20                                            +----------+--------+--------+--------+---------------------+--------+ ICA Distal106     39                                            +----------+--------+--------+--------+---------------------+--------+ ECA       93      11                                            +----------+--------+--------+--------+---------------------+--------+ Portions of this table do not appear on this page. +----------+--------+-------+--------+------------+  PSV cm/sEDV cmsDescribeArm Pressure +----------+--------+-------+--------+------------+ Subclavian89                                  +----------+--------+-------+--------+------------+ +---------+--------+--+--------+--+---------+ VertebralPSV cm/s72EDV cm/s21Antegrade +---------+--------+--+--------+--+---------+ Left Carotid Findings: +----------+--------+--------+--------+---------------------+--------+           PSV cm/sEDV cm/sStenosisDescribe             Comments +----------+--------+--------+--------+---------------------+--------+ CCA Prox  67      14                                            +----------+--------+--------+--------+---------------------+--------+ CCA Distal48      9               focal and hyperechoic         +----------+--------+--------+--------+---------------------+--------+ ICA Prox  63      23                                            +----------+--------+--------+--------+---------------------+--------+ ICA Distal76      17                                            +----------+--------+--------+--------+---------------------+--------+ ECA       69      10                                            +----------+--------+--------+--------+---------------------+--------+  +----------+--------+--------+--------+------------+ SubclavianPSV cm/sEDV cm/sDescribeArm Pressure +----------+--------+--------+--------+------------+           89                      105          +----------+--------+--------+--------+------------+ +---------+--------+--+--------+--+---------+ VertebralPSV cm/s78EDV cm/s20Antegrade +---------+--------+--+--------+--+---------+  ABI Findings: +--------+------------------+-----+---------+----------------------------------+ Right   Rt Pressure (mmHg)IndexWaveform Comment                            +--------+------------------+-----+---------+----------------------------------+ Brachial                       triphasicBP unable to take due to PICC line +--------+------------------+-----+---------+----------------------------------+ ATA     123               1.17 triphasic                                   +--------+------------------+-----+---------+----------------------------------+ PTA     150               1.43 triphasic                                   +--------+------------------+-----+---------+----------------------------------+ +--------+------------------+-----+---------+-------+ Left    Lt Pressure (mmHg)IndexWaveform Comment +--------+------------------+-----+---------+-------+ [id number]                    triphasic        +--------+------------------+-----+---------+-------+  ATA     125               1.19 triphasic        +--------+------------------+-----+---------+-------+ PTA     111               1.06 triphasic        +--------+------------------+-----+---------+-------+  Summary: Right Carotid: Velocities in the right ICA are consistent with a 1-39% stenosis. Left Carotid: Velocities in the left ICA are consistent with a 1-39% stenosis. Vertebrals:  Bilateral vertebral arteries demonstrate antegrade flow. Subclavians: Normal flow hemodynamics were seen in bilateral subclavian               arteries.  *See table(s) above for measurements and observations. Right ABI: Resting right ankle-brachial index indicates noncompressible right lower extremity arteries. May be a false reading as waveforms normal. Left ABI: Resting left ankle-brachial index is within normal range. No evidence of significant left lower extremity arterial disease.  Electronically signed by Ruta Hinds MD on 01/19/2019 at 2:25:14 PM.    Final    Vas [place] Lower Extremity Venous (dvt)  Result Date: 01/19/2019  Lower Venous Study Indications: Pre-op.  Performing Technologist: Antonieta Pert RDMS, RVT  Examination Guidelines: A complete evaluation includes B-mode imaging, spectral Doppler, color Doppler, and power Doppler as needed of all accessible portions of each vessel. Bilateral testing is considered an integral part of a complete examination. Limited examinations for reoccurring indications may be performed as noted.  Right Venous Findings: +---------+---------------+---------+-----------+----------+-------+          CompressibilityPhasicitySpontaneityPropertiesSummary +---------+---------------+---------+-----------+----------+-------+ CFV      Full           Yes      Yes                          +---------+---------------+---------+-----------+----------+-------+ SFJ      Full                                                 +---------+---------------+---------+-----------+----------+-------+ FV Prox  Full                                                 +---------+---------------+---------+-----------+----------+-------+ FV Mid   Full                                                 +---------+---------------+---------+-----------+----------+-------+ FV DistalFull                                                 +---------+---------------+---------+-----------+----------+-------+ PFV      Full                                                  +---------+---------------+---------+-----------+----------+-------+ POP      Full  Yes      Yes                          +---------+---------------+---------+-----------+----------+-------+ PTV      Full                                                 +---------+---------------+---------+-----------+----------+-------+ PERO     Full                                                 +---------+---------------+---------+-----------+----------+-------+ GSV      Full                                                 +---------+---------------+---------+-----------+----------+-------+ Pitting edema in right calf.  Left Venous Findings: +---------+---------------+---------+-----------+----------+-------------------+          CompressibilityPhasicitySpontaneityPropertiesSummary             +---------+---------------+---------+-----------+----------+-------------------+ CFV                     Yes      Yes                  could not tolerate                                                        compression         +---------+---------------+---------+-----------+----------+-------------------+ SFJ                     Yes      Yes                                      +---------+---------------+---------+-----------+----------+-------------------+ FV Prox  Full                                                             +---------+---------------+---------+-----------+----------+-------------------+ FV Mid   Full                                                             +---------+---------------+---------+-----------+----------+-------------------+ FV DistalFull                                                             +---------+---------------+---------+-----------+----------+-------------------+  PFV      Full                                                              +---------+---------------+---------+-----------+----------+-------------------+ POP      Full           Yes      Yes                                      +---------+---------------+---------+-----------+----------+-------------------+ PTV      Full                                                             +---------+---------------+---------+-----------+----------+-------------------+ PERO     Full                                                             +---------+---------------+---------+-----------+----------+-------------------+ GSV      Partial                                                          +---------+---------------+---------+-----------+----------+-------------------+ Patient could not tolerate compression in the left groin area.    Summary: Right: There is no evidence of deep vein thrombosis in the lower extremity. No cystic structure found in the popliteal fossa. Left: There is no evidence of deep vein thrombosis in the lower extremity. A cystic structure is found in the popliteal fossa. Complex bakers cyst noted in left popliteal fossa.  *See table(s) above for measurements and observations. Electronically signed by Ruta Hinds MD on 01/19/2019 at 2:24:44 PM.    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

## 2019-02-10 ENCOUNTER — Encounter: Payer: Self-pay | Admitting: Internal Medicine

## 2019-02-10 NOTE — Progress Notes (Signed)
:   Location:  [place] Room Number: 761Y Place of Service:  SNF (31)  Anne D. Sheppard Coil, MD  Patient Care Team: [name]., MD as PCP - General (Internal Medicine)  Extended Emergency Contact Information Primary Emergency Contact: Hestand,Mozelle Address: [address] [place], [place] [address] [place] Phone: [phone] Relation: Spouse Secondary Emergency Contact: Barbara,Romero D.  [place] Phone: [phone] Relation: Son     Allergies: Patient has no known allergies.  Chief Complaint  Patient presents with  . Readmit To SNF    Admit to [facility]    HPI: Patient is 78 y.o. male who  Past Medical History:  Diagnosis Date  . AICD (automatic cardioverter/defibrillator) present   . Cancer of left kidney ([place])    S/P OR 05/2014  . CHF (congestive heart failure) ([place])   . CKD (chronic kidney disease)   . 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 Coronary artery disease   . DVT (deep venous thrombosis) (H[facility]) 1983   LLE  . GERD (gastroesophageal reflux disease)   . History of hiatal hernia   . Hypertension     Past Surgical History:  Procedure Laterality Date  . CARDIAC CATHETERIZATION  1980's  . IMPLANTABLE CARDIOVERTER DEFIBRILLATOR IMPLANT  07/21/2010  . IR FLUORO GUIDE CV LINE RIGHT  01/23/2019  . IR GENERIC HISTORICAL  12/23/2014   IR RADIOLOGIST EVAL & MGMT 12/23/2014 [name], MD [facility] INTERV RAD  . IR GENERIC HISTORICAL  07/20/2016   IR RADIOLOGIST EVAL & MGMT 07/20/2016 [facility] INTERV RAD  . IR RADIOLOGIST EVAL & MGMT  07/04/2017  . IR US GUIDE VASC ACCESS RIGHT  01/23/2019  . RENAL CRYOABLATION Left 05/2014  . RIGHT/LEFT HEART CATH AND CORONARY ANGIOGRAPHY N/A 01/07/2019   Procedure: RIGHT/LEFT HEART CATH AND CORONARY ANGIOGRAPHY;  Surgeon: Jolaine Artist, MD;  Location: [place] [facility];  Service: Cardiovascular;   Laterality: N/A;    Allergies as of 02/10/2019   No Known Allergies     Medication List       Accurate as of February 10, 2019 12:18 PM. Always use your most recent med list.        acetaminophen 325 MG tablet Commonly known as:  TYLENOL Take 2 tablets (650 mg total) by mouth every 4 (four) hours as needed for headache or mild pain.   amiodarone 200 MG tablet Commonly known as:  PACERONE Take 200 mg by mouth daily.   benzonatate 100 MG capsule Commonly known as:  TESSALON Take 1 capsule (100 mg total) by mouth every 8 (eight) hours.   Dulera 200-5 MCG/ACT Aero Generic drug:  mometasone-formoterol Inhale 2 puffs into the lungs 2 (two) times daily.   feeding supplement (BOOST BREEZE) Liqd Take 1 Can by mouth 2 (two) times daily.   lactobacillus Pack Take 1 packet (1 g total) by mouth 3 (three) times daily with meals.   milrinone 20 MG/100 ML Soln infusion Commonly known as:  PRIMACOR Inject 0.0205 mg/min into the vein continuous.   multivitamin with minerals Tabs tablet Take 1 tablet by mouth daily.   OXYGEN Inhale 2 L into the lungs continuous.   pantoprazole 40 MG tablet Commonly known as:  PROTONIX Take 1 tablet (40 mg total) by mouth daily.   polyethylene glycol packet Commonly known as:  MIRALAX / GLYCOLAX Take 17  g by mouth daily.   potassium chloride SA 20 MEQ tablet Commonly known as:  K-DUR,KLOR-CON Take 2 tablets (40 mEq total) by mouth daily.   spironolactone 25 MG tablet Commonly known as:  ALDACTONE Take 0.5 tablets (12.5 mg total) by mouth at bedtime.   sucralfate 1 g tablet Commonly known as:  Carafate Take 1 tablet (1 g total) by mouth 4 (four) times daily -  with meals and at bedtime.   tamsulosin 0.4 MG Caps capsule Commonly known as:  FLOMAX Take 2 capsules (0.8 mg total) by mouth daily after supper.   torsemide 20 MG tablet Commonly known as:  DEMADEX Take 2 tablets (40 mg total) by mouth 2 (two) times daily.       No orders of  the defined types were placed in this encounter.    There is no immunization history on file for this patient.  Social History   Tobacco Use  . Smoking status: Former Smoker    Packs/day: 0.75    Years: 17.00    Pack years: 12.75    Types: Cigarettes    Start date: 05/08/1959    Last attempt to quit: 06/20/1976    Years since quitting: 42.6  . Smokeless tobacco: Never Used  Substance Use Topics  . Alcohol use: No    Family history is   Family History  Problem Relation Age of Onset  . Heart disease Father   . Heart attack Father 30  . Hypertension Father   . Hypertension Mother       Review of Systems  DATA OBTAINED: from patient, nurse, medical record, family member GENERAL:  no fevers, fatigue, appetite changes SKIN: No itching, or rash EYES: No eye pain, redness, discharge EARS: No earache, tinnitus, change in hearing NOSE: No congestion, drainage or bleeding  MOUTH/THROAT: No mouth or tooth pain, No sore throat RESPIRATORY: No cough, wheezing, SOB CARDIAC: No chest pain, palpitations, lower extremity edema  GI: No abdominal pain, No N/V/D or constipation, No heartburn or reflux  GU: No dysuria, frequency or urgency, or incontinence  MUSCULOSKELETAL: No unrelieved bone/joint pain NEUROLOGIC: No headache, dizziness or focal weakness PSYCHIATRIC: No c/o anxiety or sadness   Vitals:   02/10/19 0954  BP: 114/69  Pulse: 78  Resp: 18  Temp: 98.5 F (36.9 C)    SpO2 Readings from Last 1 Encounters:  02/07/19 100%   Body mass index is 22.85 kg/m.     Physical Exam  GENERAL APPEARANCE: Alert, conversant,  No acute distress.  SKIN: No diaphoresis rash HEAD: Normocephalic, atraumatic  EYES: Conjunctiva/lids clear. Pupils round, reactive. EOMs intact.  EARS: External exam WNL, canals clear. Hearing grossly normal.  NOSE: No deformity or discharge.  MOUTH/THROAT: Lips w/o lesions  RESPIRATORY: Breathing is even, unlabored. Lung sounds are clear     CARDIOVASCULAR: Heart RRR no murmurs, rubs or gallops. No peripheral edema.   GASTROINTESTINAL: Abdomen is soft, non-tender, not distended w/ normal bowel sounds. GENITOURINARY: Bladder non tender, not distended  MUSCULOSKELETAL: No abnormal joints or musculature NEUROLOGIC:  Cranial nerves 2-12 grossly intact. Moves all extremities  PSYCHIATRIC: Mood and affect appropriate to situation, no behavioral issues  Patient Active Problem List   Diagnosis Date Noted  . Chronic systolic (congestive) heart failure ([place]) 02/08/2019  . Cardiogenic shock ([place]) 02/01/2019  . Volume overload 02/01/2019  . Retroperitoneal bleed 02/01/2019  . Urinary tract infection due to Klebsiella species 02/01/2019  . Weakness generalized   . Tachypnea   . Sinus tachycardia   .  Hyponatremia   . Acute blood loss anemia   . DNR (do not resuscitate) discussion   . Palliative care by specialist   . Malnutrition of moderate degree 01/06/2019  . CKD (chronic kidney disease) stage 3, GFR 30-59 ml/min (HCC) 12/30/2018  . PVCs (premature ventricular contractions) 12/30/2018  . Acute on chronic systolic congestive heart failure ([place]) 12/29/2018  . CHF (congestive heart failure) ([place]) 05/03/2018  . Acute on chronic systolic (congestive) heart failure ([place]) 05/03/2018  . Glucose intolerance (impaired glucose tolerance) 06/23/2016  . Vitreous floaters 12/08/2015  . TIA (transient ischemic attack) 12/08/2015  . SOB (shortness of breath) 12/08/2015  . Posterior vitreous detachment of both eyes 12/08/2015  . Pain due to dental caries 12/08/2015  . Other abnormal glucose 12/08/2015  . Osteoarthrosis 12/08/2015  . Long term (current) use of anticoagulants 12/08/2015  . Kidney mass 12/08/2015  . Hypersomnolence 12/08/2015  . Hyperopia with presbyopia of both eyes 12/08/2015  . Hyperlipidemia 12/08/2015  . History of orthopnea 12/08/2015  . COPD (chronic obstructive pulmonary disease) ([place]) 12/08/2015  . Apical mural  thrombus without MI 12/08/2015  . AKI (acute kidney injury) ([place]) 12/08/2015  . Abnormal CT scan, kidney 12/08/2015  . Abdominal bloating 12/08/2015  . Biventricular ICD (implantable cardioverter-defibrillator) in place 12/07/2015  . Chest pain at rest 11/29/2015  . Essential hypertension 11/29/2015  . Acute on chronic systolic CHF (congestive heart failure), NYHA class 1 ([place]) 11/29/2015  . Gastroesophageal reflux disease without esophagitis 11/29/2015  . Pain in the chest   . Renal cancer ([place]) 08/16/2015  . 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 Cancer of kidney ([place]) 01/19/2015  . Hematuria 09/04/2014  . Psychosexual dysfunction with inhibited sexual excitement 07/29/2014  . Pelvic pain in male 07/29/2014  . Increased urinary frequency 07/29/2014  . Heartburn 07/29/2014  . EKG, abnormal 07/29/2014  . Corneal scar 07/29/2014  . 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 Cardiomyopathy ([place]) 07/29/2014  . CAD (coronary artery disease) 07/29/2014  . Disorder of kidney and ureter 04/09/2014      Labs reviewed: Basic Metabolic Panel:    Component Value Date/Time   NA 132 (L) 02/06/2019 1850   NA 137 02/05/2019   K 4.9 02/06/2019 1850   CL 102 02/06/2019 1850   CO2 25 02/06/2019 1850   GLUCOSE 125 (H) 02/06/2019 1850   BUN 39 (H) 02/06/2019 1850   BUN 38 (A) 02/05/2019   CREATININE 1.66 (H) 02/06/2019 1850   CALCIUM 8.8 (L) 02/06/2019 1850   PROT 7.8 02/06/2019 1850   ALBUMIN 3.0 (L) 02/06/2019 1850   AST 46 (H) 02/06/2019 1850   ALT 18 02/06/2019 1850   ALKPHOS 118 02/06/2019 1850   BILITOT 2.4 (H) 02/06/2019 1850   GFRNONAA 39 (L) 02/06/2019 1850   GFRAA 45 (L) 02/06/2019 1850    Recent Labs    01/28/19 0401 01/29/19 0530 02/04/19 1112 02/05/19 02/06/19 1850  NA 132* 134* 133* 137 132*  K 3.8 3.2* 4.3 4.1 4.9  CL 94* 96* 95*  --  102  CO2 26 28 32  --  25  GLUCOSE 117* 159* 88  --  125*  BUN 47* 43* 37* 38* 39*  CREATININE 1.41* 1.41* 1.41*  1.3 1.66*  CALCIUM 8.8* 8.4* 9.2  --  8.8*  MG 2.3 1.9  --   --  2.2   Liver Function Tests: Recent Labs    01/14/19 0625 02/04/19 1112 02/06/19 1850  AST 54* 45* 46*  ALT  22 17 18   ALKPHOS 74 109 118  BILITOT 5.3* 2.9* 2.4*  PROT 6.3* 7.3 7.8  ALBUMIN 2.5* 2.8* 3.0*   Recent Labs    05/03/18 1523 06/11/18 1740  LIPASE 42 42   Recent Labs    01/04/19 0547  AMMONIA 41*   CBC: Recent Labs    11/22/18 1509  12/29/18 1942  01/06/19 1940  01/29/19 0530 02/04/19 1112 02/05/19 02/06/19 1850  WBC 5.3   < > 5.7   < > 4.5   < > 7.2 5.9 5.2  5.2 7.3  NEUTROABS 3.6  --  4.0  --  2.9  --   --   --   --   --   HGB 13.5   < > 13.6   < > 12.0*   < > 9.5* 12.0* 11.4* 12.5*  HCT 43.0   < > 43.1   < > 36.9*   < > 30.2* 37.5* 35* 38.6*  MCV 80.1   < > 81.9   < > 80.9   < > 86.5 85.6  --  86.2  PLT 122*   < > 144*   < > 125*   < > 222 179 166 167   < > = values in this interval not displayed.   Lipid Recent Labs    01/06/19 1650  CHOL 98  HDL 46  LDLCALC 41  TRIG 57    Cardiac Enzymes: Recent Labs    12/04/18 1743 12/28/18 1821 12/29/18 1942  TROPONINI 0.06* 0.05* 0.05*   BNP: Recent Labs    12/28/18 1821 12/29/18 1943 02/06/19 1850  BNP >4,925.0* >4,500.0* 2,172.9*   No results found for: [facility] Lab Results  Component Value Date   HGBA1C 6.8 (H) 01/06/2019   Lab Results  Component Value Date   TSH 0.906 01/06/2019   No results found for: VITAMINB12 No results found for: FOLATE No results found for: IRON, TIBC, FERRITIN  Imaging and Procedures obtained prior to SNF admission: Dg Chest Portable 1 View  Result Date: 02/06/2019 CLINICAL DATA:  Awoke with shortness of breath. History of CHF. EXAM: PORTABLE CHEST 1 VIEW COMPARISON:  Radiograph 01/11/2019. FINDINGS: Marked cardiomegaly, unchanged from prior exam. Left-sided pacemaker remains in place. Right internal jugular central venous catheter tip in the lower SVC. Improved pulmonary edema from  prior exam. Patchy infrahilar opacities may be atelectasis or residual pulmonary edema. Small right pleural effusion. No pneumothorax. IMPRESSION: 1. Improved pulmonary edema from prior exam 1 month ago. Patchy infrahilar opacities may be residual pulmonary edema or atelectasis. Small right pleural effusion, similar or improved. 2. Stable marked cardiomegaly. Electronically Signed   By: Keith Rake M.D.   On: 02/06/2019 19:14     Not all labs, radiology exams or other studies done during hospitalization come through on my EPIC note; however they are reviewed by me.    Assessment and Plan  No problem-specific Assessment & Plan notes found for this encounter.   Noah Delaine. Sheppard Coil, MD   This encounter was created in error - please disregard.

## 2019-02-11 ENCOUNTER — Emergency Department (HOSPITAL_COMMUNITY)
Admission: EM | Admit: 2019-02-11 | Discharge: 2019-02-12 | Disposition: A | Discharge status: Home / Self Care | Payer: Medicare Other | Attending: Emergency Medicine | Admitting: Emergency Medicine

## 2019-02-11 ENCOUNTER — Other Ambulatory Visit: Payer: Self-pay

## 2019-02-11 ENCOUNTER — Emergency Department (HOSPITAL_COMMUNITY): Payer: Medicare Other

## 2019-02-11 DIAGNOSIS — J449 Chronic obstructive pulmonary disease, unspecified: Secondary | ICD-10-CM | POA: Diagnosis not present

## 2019-02-11 DIAGNOSIS — N183 Chronic kidney disease, stage 3 (moderate): Secondary | ICD-10-CM | POA: Diagnosis not present

## 2019-02-11 DIAGNOSIS — E785 Hyperlipidemia, unspecified: Secondary | ICD-10-CM | POA: Insufficient documentation

## 2019-02-11 DIAGNOSIS — R109 Unspecified abdominal pain: Secondary | ICD-10-CM

## 2019-02-11 DIAGNOSIS — Z95810 Presence of automatic (implantable) cardiac defibrillator: Secondary | ICD-10-CM | POA: Insufficient documentation

## 2019-02-11 DIAGNOSIS — Z8673 Personal history of transient ischemic attack (TIA), and cerebral infarction without residual deficits: Secondary | ICD-10-CM | POA: Insufficient documentation

## 2019-02-11 DIAGNOSIS — Z79899 Other long term (current) drug therapy: Secondary | ICD-10-CM | POA: Diagnosis not present

## 2019-02-11 DIAGNOSIS — I5022 Chronic systolic (congestive) heart failure: Secondary | ICD-10-CM | POA: Diagnosis not present

## 2019-02-11 DIAGNOSIS — Z87891 Personal history of nicotine dependence: Secondary | ICD-10-CM | POA: Insufficient documentation

## 2019-02-11 DIAGNOSIS — R1032 Left lower quadrant pain: Secondary | ICD-10-CM | POA: Insufficient documentation

## 2019-02-11 DIAGNOSIS — I130 Hypertensive heart and chronic kidney disease with heart failure and stage 1 through stage 4 chronic kidney disease, or unspecified chronic kidney disease: Secondary | ICD-10-CM | POA: Insufficient documentation

## 2019-02-11 LAB — URINALYSIS, ROUTINE W REFLEX MICROSCOPIC
Bilirubin Urine: NEGATIVE
Glucose, UA: NEGATIVE mg/dL
Ketones, ur: NEGATIVE mg/dL
Nitrite: NEGATIVE
Protein, ur: NEGATIVE mg/dL
Specific Gravity, Urine: 1.008 (ref 1.005–1.030)
WBC, UA: >50 WBC/hpf — ABNORMAL HIGH (ref 0–5)
pH: 5 (ref 5.0–8.0)

## 2019-02-11 LAB — CBC WITH DIFFERENTIAL/PLATELET
Abs Immature Granulocytes: 0.02 10*3/uL (ref 0.00–0.07)
Basophils Absolute: 0 10*3/uL (ref 0.0–0.1)
Basophils Relative: 1 %
Eosinophils Absolute: 0.1 10*3/uL (ref 0.0–0.5)
Eosinophils Relative: 1 %
HEMATOCRIT: 35.5 % — AB (ref 39.0–52.0)
Hemoglobin: 11.6 g/dL — ABNORMAL LOW (ref 13.0–17.0)
Immature Granulocytes: 0 %
LYMPHS ABS: 0.8 10*3/uL (ref 0.7–4.0)
LYMPHS PCT: 14 %
MCH: 28.1 pg (ref 26.0–34.0)
MCHC: 32.7 g/dL (ref 30.0–36.0)
MCV: 86 fL (ref 80.0–100.0)
Monocytes Absolute: 0.6 10*3/uL (ref 0.1–1.0)
Monocytes Relative: 11 %
Neutro Abs: 4.3 10*3/uL (ref 1.7–7.7)
Neutrophils Relative %: 73 %
Platelets: 126 10*3/uL — ABNORMAL LOW (ref 150–400)
RBC: 4.13 MIL/uL — ABNORMAL LOW (ref 4.22–5.81)
RDW: 19.8 % — ABNORMAL HIGH (ref 11.5–15.5)
WBC: 5.8 10*3/uL (ref 4.0–10.5)
nRBC: 0 % (ref 0.0–0.2)

## 2019-02-11 LAB — COMPREHENSIVE METABOLIC PANEL
ALT: 14 U/L (ref 0–44)
AST: 32 U/L (ref 15–41)
Albumin: 2.9 g/dL — ABNORMAL LOW (ref 3.5–5.0)
Alkaline Phosphatase: 112 U/L (ref 38–126)
Anion gap: 11 (ref 5–15)
BUN: 30 mg/dL — ABNORMAL HIGH (ref 8–23)
CALCIUM: 8.9 mg/dL (ref 8.9–10.3)
CO2: 24 mmol/L (ref 22–32)
Chloride: 97 mmol/L — ABNORMAL LOW (ref 98–111)
Creatinine, Ser: 1.55 mg/dL — ABNORMAL HIGH (ref 0.61–1.24)
GFR calc Af Amer: 49 mL/min — ABNORMAL LOW (ref >60)
GFR calc non Af Amer: 43 mL/min — ABNORMAL LOW (ref >60)
GLUCOSE: 128 mg/dL — AB (ref 70–99)
Potassium: 4 mmol/L (ref 3.5–5.1)
Sodium: 132 mmol/L — ABNORMAL LOW (ref 135–145)
Total Bilirubin: 2.6 mg/dL — ABNORMAL HIGH (ref 0.3–1.2)
Total Protein: 7.2 g/dL (ref 6.5–8.1)

## 2019-02-11 LAB — PROTIME-INR
INR: 1.3 — ABNORMAL HIGH (ref 0.8–1.2)
Prothrombin Time: 15.8 seconds — ABNORMAL HIGH (ref 11.4–15.2)

## 2019-02-11 NOTE — ED Triage Notes (Signed)
Per [facility], pt from [facility], just started complaining of LLQ abd pain.  Pt is tender to palpatation.  Denies any symptoms including CP, N,V,D, SOB.  Facility reports decreased urine output, sent out culture today around 6pm. Vitals stable 118/74, heart rate 88, 98% on 2 L (home O2).

## 2019-02-11 NOTE — ED Notes (Signed)
Pt disinterested in answering questions, having to be asked repeatedly to answer the questions posed, the patient was more interested in talking to the person sitting with him, then on his phone, then the television.

## 2019-02-12 NOTE — ED Provider Notes (Signed)
[facility] EMERGENCY DEPARTMENT Provider Note   CSN: [id number] Arrival date & time: 02/11/19  2010    History   Chief Complaint Chief Complaint  Patient presents with  . Abdominal Pain    HPI Spencer Rice is a 78 y.o. male.     HPI Patient presents from the nursing home.  Recent admission to hospital and had left-sided retroperitoneal bleed.  Was at nursing home today and had pain in his left lower abdomen.  States that eating made it worse.  No nausea vomiting.  No fevers.  Not on anticoagulation.  Has a bad ejection fraction.  Pain is dull.  No blood in the stool.  No fevers.  No nausea or vomiting.  No chest pain.  States he just felt a little bad.  Nursing home reportedly sent out urine culture today. Past Medical History:  Diagnosis Date  . AICD (automatic cardioverter/defibrillator) present   . 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 CHF (congestive heart failure) ([place])   . CKD (chronic kidney disease)   . 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 Coronary artery disease   . DVT (deep venous thrombosis) (HCC) 1983   LLE  . GERD (gastroesophageal reflux disease)   . History of hiatal hernia   . Hypertension     Patient Active Problem List   Diagnosis Date Noted  . Chronic systolic (congestive) heart failure ([place]) 02/08/2019  . Cardiogenic shock ([place]) 02/01/2019  . Volume overload 02/01/2019  . Retroperitoneal bleed 02/01/2019  . Urinary tract infection due to Klebsiella species 02/01/2019  . Weakness generalized   . Tachypnea   . Sinus tachycardia   . Hyponatremia   . Acute blood loss anemia   . DNR (do not resuscitate) discussion   . Palliative care by specialist   . Malnutrition of moderate degree 01/06/2019  . CKD (chronic kidney disease) stage 3, GFR 30-59 ml/min (HCC) 12/30/2018  . PVCs (premature ventricular contractions) 12/30/2018  . Acute on chronic systolic congestive heart failure  ([place]) 12/29/2018  . CHF (congestive heart failure) ([place]) 05/03/2018  . Acute on chronic systolic (congestive) heart failure ([place]) 05/03/2018  . Glucose intolerance (impaired glucose tolerance) 06/23/2016  . Vitreous floaters 12/08/2015  . TIA (transient ischemic attack) 12/08/2015  . SOB (shortness of breath) 12/08/2015  . Posterior vitreous detachment of both eyes 12/08/2015  . Pain due to dental caries 12/08/2015  . Other abnormal glucose 12/08/2015  . Osteoarthrosis 12/08/2015  . Long term (current) use of anticoagulants 12/08/2015  . Kidney mass 12/08/2015  . Hypersomnolence 12/08/2015  . Hyperopia with presbyopia of both eyes 12/08/2015  . Hyperlipidemia 12/08/2015  . History of orthopnea 12/08/2015  . COPD (chronic obstructive pulmonary disease) ([place]) 12/08/2015  . 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 AKI (acute kidney injury) ([place]) 12/08/2015  . Abnormal CT scan, kidney 12/08/2015  . Abdominal bloating 12/08/2015  . Biventricular ICD (implantable cardioverter-defibrillator) in place 12/07/2015  . Chest pain at rest 11/29/2015  . Essential hypertension 11/29/2015  . Acute on chronic systolic CHF (congestive heart failure), NYHA class 1 ([place]) 11/29/2015  . Gastroesophageal reflux disease without esophagitis 11/29/2015  . Pain in the chest   . Renal cancer ([place]) 08/16/2015  . 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 Cancer of kidney ([place]) 01/19/2015  . Hematuria 09/04/2014  . Psychosexual dysfunction with inhibited sexual excitement 07/29/2014  . Pelvic pain in male  07/29/2014  . Increased urinary frequency 07/29/2014  . Heartburn 07/29/2014  . EKG, abnormal 07/29/2014  . Corneal scar 07/29/2014  . 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 Cardiomyopathy ([place]) 07/29/2014  . CAD (coronary artery disease) 07/29/2014  . Disorder of kidney and ureter 04/09/2014    Past Surgical History:  Procedure Laterality Date  . CARDIAC CATHETERIZATION  1980's  .  IMPLANTABLE CARDIOVERTER DEFIBRILLATOR IMPLANT  07/21/2010  . IR FLUORO GUIDE CV LINE RIGHT  01/23/2019  . IR GENERIC HISTORICAL  12/23/2014   IR RADIOLOGIST EVAL & MGMT 12/23/2014 [name], MD [facility] INTERV RAD  . IR GENERIC HISTORICAL  07/20/2016   IR RADIOLOGIST EVAL & MGMT 07/20/2016 [facility] INTERV RAD  . IR RADIOLOGIST EVAL & MGMT  07/04/2017  . IR US GUIDE VASC ACCESS RIGHT  01/23/2019  . RENAL CRYOABLATION Left 05/2014  . RIGHT/LEFT HEART CATH AND CORONARY ANGIOGRAPHY N/A 01/07/2019   Procedure: RIGHT/LEFT HEART CATH AND CORONARY ANGIOGRAPHY;  Surgeon: Jolaine Artist, MD;  Location: [facility] CV LAB;  Service: Cardiovascular;  Laterality: N/A;        Home Medications    Prior to Admission medications   Medication Sig Start Date End Date Taking? Authorizing Provider  acetaminophen (TYLENOL) 325 MG tablet Take 2 tablets (650 mg total) by mouth every 4 (four) hours as needed for headache or mild pain. Patient taking differently: Take 650 mg by mouth every 6 (six) hours as needed for mild pain or headache.  01/29/19  Yes Tillery, Satira Mccallum, PA-C  amiodarone (PACERONE) 200 MG tablet Take 200 mg by mouth daily.   Yes [provider]  benzonatate (TESSALON) 100 MG capsule Take 1 capsule (100 mg total) by mouth every 8 (eight) hours. 08/15/17  Yes Palumbo, April, MD  lactobacillus (FLORANEX/LACTINEX) PACK Take 1 packet (1 g total) by mouth 3 (three) times daily with meals. 01/29/19  Yes Shirley Friar, PA-C  milrinone [facility]) 20 MG/100 ML SOLN infusion Inject 0.0205 mg/min into the vein continuous. 01/29/19  Yes Shirley Friar, PA-C  mometasone-formoterol (DULERA) 200-5 MCG/ACT AERO Inhale 2 puffs into the lungs 2 (two) times daily.   Yes [provider]  Multiple Vitamin (MULTIVITAMIN WITH MINERALS) TABS tablet Take 1 tablet by mouth daily.   Yes [provider]  Nutritional Supplements (FEEDING SUPPLEMENT, BOOST BREEZE,) LIQD Take 1 Can by  mouth 2 (two) times daily.   Yes [provider]  OXYGEN Inhale 2 L into the lungs continuous.   Yes [provider]  pantoprazole (PROTONIX) 40 MG tablet Take 1 tablet (40 mg total) by mouth daily. 01/29/19  Yes Shirley Friar, PA-C  polyethylene glycol [facility] / GLYCOLAX) packet Take 17 g by mouth daily. 01/29/19  Yes Shirley Friar, PA-C  potassium chloride SA (K-DUR,KLOR-CON) 20 MEQ tablet Take 2 tablets (40 mEq total) by mouth daily. 01/30/19  Yes Shirley Friar, PA-C  spironolactone (ALDACTONE) 25 MG tablet Take 0.5 tablets (12.5 mg total) by mouth at bedtime. 01/29/19  Yes Shirley Friar, PA-C  sucralfate (CARAFATE) 1 g tablet Take 1 tablet (1 g total) by mouth 4 (four) times daily -  with meals and at bedtime. 06/11/18  Yes Julianne Rice, MD  tamsulosin (FLOMAX) 0.4 MG CAPS capsule Take 2 capsules (0.8 mg total) by mouth daily after supper. 01/29/19  Yes Shirley Friar, PA-C  torsemide (DEMADEX) 20 MG tablet Take 2 tablets (40 mg total) by mouth 2 (two) times daily. 01/29/19  Yes  Shirley Friar, PA-C    Family History Family History  Problem Relation Age of Onset  . Heart disease Father   . Heart attack Father 40  . Hypertension Father   . Hypertension Mother     Social History Social History   Tobacco Use  . Smoking status: Former Smoker    Packs/day: 0.75    Years: 17.00    Pack years: 12.75    Types: Cigarettes    Start date: 05/08/1959    Last attempt to quit: 06/20/1976    Years since quitting: 42.6  . Smokeless tobacco: Never Used  Substance Use Topics  . Alcohol use: No  . Drug use: No     Allergies   Patient has no known allergies.   Review of Systems Review of Systems  Constitutional: Positive for appetite change.  HENT: Negative for congestion.   Respiratory: Negative for shortness of breath.   Cardiovascular: Negative for chest pain.  Gastrointestinal: Positive for abdominal pain.  Negative for diarrhea and nausea.  Genitourinary: Negative for flank pain.  Musculoskeletal: Negative for back pain.  Neurological: Positive for weakness.  Psychiatric/Behavioral: Negative for confusion.     Physical Exam Updated Vital Signs BP 91/67 (BP Location: Left Arm)   Pulse 76   Resp 18   SpO2 100%   Physical Exam HENT:     Head: Normocephalic.  Cardiovascular:     Rate and Rhythm: Normal rate.  Pulmonary:     Comments: Milrinone infusion to right upper extremity. Abdominal:     Hernia: No hernia is present.     Comments: Some left lower and left inguinal tenderness.  No rebound or guarding.  Skin:    General: Skin is warm.  Neurological:     General: No focal deficit present.     Mental Status: He is alert.      ED Treatments / Results  Labs (all labs ordered are listed, but only abnormal results are displayed) Labs Reviewed  COMPREHENSIVE METABOLIC PANEL - Abnormal; Notable for the following components:      Result Value   Sodium 132 (*)    Chloride 97 (*)    Glucose, Bld 128 (*)    BUN 30 (*)    Creatinine, Ser 1.55 (*)    Albumin 2.9 (*)    Total Bilirubin 2.6 (*)    GFR calc non Af Amer 43 (*)    GFR calc Af Amer 49 (*)    All other components within normal limits  PROTIME-INR - Abnormal; Notable for the following components:   Prothrombin Time 15.8 (*)    INR 1.3 (*)    All other components within normal limits  CBC WITH DIFFERENTIAL/PLATELET - Abnormal; Notable for the following components:   RBC 4.13 (*)    Hemoglobin 11.6 (*)    HCT 35.5 (*)    RDW 19.8 (*)    Platelets 126 (*)    All other components within normal limits  URINALYSIS, ROUTINE W REFLEX MICROSCOPIC - Abnormal; Notable for the following components:   Hgb urine dipstick MODERATE (*)    Leukocytes,Ua LARGE (*)    WBC, UA >50 (*)    Bacteria, UA MANY (*)    All other components within normal limits    EKG None  Radiology Ct Abdomen Pelvis Wo Contrast  Result Date:  02/11/2019 CLINICAL DATA:  78 year old male with LEFT LOWER quadrant abdominal pain. History of recent retroperitoneal hematoma. EXAM: CT ABDOMEN AND PELVIS WITHOUT CONTRAST TECHNIQUE: Multidetector  CT imaging of the abdomen and pelvis was performed following the standard protocol without IV contrast. COMPARISON:  01/13/2019 CT FINDINGS: Please note that parenchymal abnormalities may be missed without intravenous contrast. Lower chest: Marked cardiomegaly again noted. A small RIGHT pleural effusion has slightly increased. No definite pericardial effusion. Hepatobiliary: The liver and gallbladder are unremarkable. No biliary dilatation. Pancreas: Unremarkable Spleen: Unremarkable Adrenals/Urinary Tract: No renal changes. LEFT renal ablation site again noted as well as a LEFT renal cyst. Stomach/Bowel: No bowel obstruction, definite bowel wall thickening or inflammatory changes. Vascular/Lymphatic: Aortic atherosclerosis. No enlarged abdominal or pelvic lymph nodes. Reproductive: Unremarkable Other: A LEFT retroperitoneal hematoma has decreased in size, now measuring 5.4 x 8.8 cm, previously 9.5 x 10 cm at the same level. Hematoma in the LEFT inguinal canal now measures 2.7 cm, previously 3.2 cm. No new hematoma identified.  No ascites or pneumoperitoneum. Musculoskeletal: No acute or suspicious bony abnormalities. IMPRESSION: 1. Decreased size of LEFT retroperitoneal and LEFT inguinal hematomas since 01/13/2019. 2. Slightly enlarging small RIGHT pleural effusion. 3. Marked cardiomegaly again noted. 4.  Aortic Atherosclerosis (ICD10-I70.0). Electronically Signed   By: Margarette Canada M.D.   On: 02/11/2019 21:22    Procedures Procedures (including critical care time)  Medications Ordered in ED Medications - No data to display   Initial Impression / Assessment and Plan / ED Course  I have reviewed the triage vital signs and the nursing notes.  Pertinent labs & imaging results that were available during my care  of the patient were reviewed by me and considered in my medical decision making (see chart for details).        Patient with some abdominal pain recent retroperitoneal bleed.  Now swelling.  Feels much better.  Tolerated orals.  Will discharge home.  Lab work is stable.  Final Clinical Impressions(s) / ED Diagnoses   Final diagnoses:  Abdominal pain, unspecified abdominal location    ED Discharge Orders    None       Davonna Belling, MD 02/12/19 0015

## 2019-02-14 ENCOUNTER — Ambulatory Visit (HOSPITAL_COMMUNITY)
Admission: RE | Admit: 2019-02-14 | Discharge: 2019-02-14 | Disposition: A | Discharge status: Home / Self Care | Payer: Medicare Other | Source: Ambulatory Visit | Attending: Internal Medicine | Admitting: Internal Medicine

## 2019-02-14 ENCOUNTER — Encounter: Payer: Self-pay | Admitting: Internal Medicine

## 2019-02-14 ENCOUNTER — Non-Acute Institutional Stay (SKILLED_NURSING_FACILITY): Payer: Medicare Other | Admitting: Internal Medicine

## 2019-02-14 ENCOUNTER — Other Ambulatory Visit (HOSPITAL_COMMUNITY): Payer: Self-pay | Admitting: Student

## 2019-02-14 ENCOUNTER — Encounter (HOSPITAL_COMMUNITY): Payer: Medicare Other | Admitting: Internal Medicine

## 2019-02-14 ENCOUNTER — Encounter (HOSPITAL_COMMUNITY): Payer: Self-pay | Admitting: Internal Medicine

## 2019-02-14 ENCOUNTER — Other Ambulatory Visit: Payer: Self-pay

## 2019-02-14 VITALS — BP 116/72 | HR 94 | Wt 179.0 lb

## 2019-02-14 DIAGNOSIS — R911 Solitary pulmonary nodule: Secondary | ICD-10-CM | POA: Insufficient documentation

## 2019-02-14 DIAGNOSIS — Z86718 Personal history of other venous thrombosis and embolism: Secondary | ICD-10-CM | POA: Insufficient documentation

## 2019-02-14 DIAGNOSIS — K219 Gastro-esophageal reflux disease without esophagitis: Secondary | ICD-10-CM

## 2019-02-14 DIAGNOSIS — E8779 Other fluid overload: Secondary | ICD-10-CM

## 2019-02-14 DIAGNOSIS — R570 Cardiogenic shock: Secondary | ICD-10-CM | POA: Diagnosis not present

## 2019-02-14 DIAGNOSIS — E1122 Type 2 diabetes mellitus with diabetic chronic kidney disease: Secondary | ICD-10-CM | POA: Diagnosis not present

## 2019-02-14 DIAGNOSIS — I493 Ventricular premature depolarization: Secondary | ICD-10-CM | POA: Diagnosis not present

## 2019-02-14 DIAGNOSIS — Z79899 Other long term (current) drug therapy: Secondary | ICD-10-CM | POA: Insufficient documentation

## 2019-02-14 DIAGNOSIS — I2510 Atherosclerotic heart disease of native coronary artery without angina pectoris: Secondary | ICD-10-CM | POA: Insufficient documentation

## 2019-02-14 DIAGNOSIS — Z9981 Dependence on supplemental oxygen: Secondary | ICD-10-CM | POA: Insufficient documentation

## 2019-02-14 DIAGNOSIS — Z95810 Presence of automatic (implantable) cardiac defibrillator: Secondary | ICD-10-CM | POA: Diagnosis not present

## 2019-02-14 DIAGNOSIS — Z87891 Personal history of nicotine dependence: Secondary | ICD-10-CM | POA: Insufficient documentation

## 2019-02-14 DIAGNOSIS — D62 Acute posthemorrhagic anemia: Secondary | ICD-10-CM

## 2019-02-14 DIAGNOSIS — I130 Hypertensive heart and chronic kidney disease with heart failure and stage 1 through stage 4 chronic kidney disease, or unspecified chronic kidney disease: Secondary | ICD-10-CM | POA: Diagnosis present

## 2019-02-14 DIAGNOSIS — I513 Intracardiac thrombosis, not elsewhere classified: Secondary | ICD-10-CM

## 2019-02-14 DIAGNOSIS — Z8249 Family history of ischemic heart disease and other diseases of the circulatory system: Secondary | ICD-10-CM | POA: Diagnosis not present

## 2019-02-14 DIAGNOSIS — R58 Hemorrhage, not elsewhere classified: Secondary | ICD-10-CM

## 2019-02-14 DIAGNOSIS — I10 Essential (primary) hypertension: Secondary | ICD-10-CM

## 2019-02-14 DIAGNOSIS — K683 Retroperitoneal hematoma: Secondary | ICD-10-CM

## 2019-02-14 DIAGNOSIS — N390 Urinary tract infection, site not specified: Secondary | ICD-10-CM

## 2019-02-14 DIAGNOSIS — I081 Rheumatic disorders of both mitral and tricuspid valves: Secondary | ICD-10-CM | POA: Insufficient documentation

## 2019-02-14 DIAGNOSIS — N183 Chronic kidney disease, stage 3 unspecified: Secondary | ICD-10-CM

## 2019-02-14 DIAGNOSIS — I447 Left bundle-branch block, unspecified: Secondary | ICD-10-CM | POA: Insufficient documentation

## 2019-02-14 DIAGNOSIS — I5022 Chronic systolic (congestive) heart failure: Secondary | ICD-10-CM | POA: Diagnosis not present

## 2019-02-14 DIAGNOSIS — B961 Klebsiella pneumoniae [K. pneumoniae] as the cause of diseases classified elsewhere: Secondary | ICD-10-CM

## 2019-02-14 DIAGNOSIS — I428 Other cardiomyopathies: Secondary | ICD-10-CM | POA: Insufficient documentation

## 2019-02-14 DIAGNOSIS — J449 Chronic obstructive pulmonary disease, unspecified: Secondary | ICD-10-CM

## 2019-02-14 DIAGNOSIS — I340 Nonrheumatic mitral (valve) insufficiency: Secondary | ICD-10-CM

## 2019-02-14 LAB — COMPREHENSIVE METABOLIC PANEL
ALT: 14 U/L (ref 0–44)
AST: 31 U/L (ref 15–41)
Albumin: 2.9 g/dL — ABNORMAL LOW (ref 3.5–5.0)
Alkaline Phosphatase: 100 U/L (ref 38–126)
Anion gap: 11 (ref 5–15)
BUN: 25 mg/dL — ABNORMAL HIGH (ref 8–23)
CO2: 23 mmol/L (ref 22–32)
Calcium: 9 mg/dL (ref 8.9–10.3)
Chloride: 101 mmol/L (ref 98–111)
Creatinine, Ser: 1.25 mg/dL — ABNORMAL HIGH (ref 0.61–1.24)
GFR calc Af Amer: >60 mL/min (ref >60)
GFR calc non Af Amer: 55 mL/min — ABNORMAL LOW (ref >60)
Glucose, Bld: 113 mg/dL — ABNORMAL HIGH (ref 70–99)
POTASSIUM: 3.9 mmol/L (ref 3.5–5.1)
SODIUM: 135 mmol/L (ref 135–145)
Total Bilirubin: 1.7 mg/dL — ABNORMAL HIGH (ref 0.3–1.2)
Total Protein: 7.2 g/dL (ref 6.5–8.1)

## 2019-02-14 LAB — PREALBUMIN: Prealbumin: 16.7 mg/dL — ABNORMAL LOW (ref 18–38)

## 2019-02-14 MED ORDER — PANTOPRAZOLE SODIUM 40 MG PO TBEC: 40.0000 mg | DELAYED_RELEASE_TABLET | Freq: Every day | ORAL | 5 refills | Status: DC

## 2019-02-14 MED ORDER — AMIODARONE HCL 200 MG PO TABS: 200.0000 mg | ORAL_TABLET | Freq: Every day | ORAL | 0 refills | Status: DC

## 2019-02-14 MED ORDER — METOLAZONE 2.5 MG PO TABS: 2.5000 mg | ORAL_TABLET | ORAL | 3 refills | Status: DC

## 2019-02-14 MED ORDER — POTASSIUM CHLORIDE CRYS ER 20 MEQ PO TBCR: 40.0000 meq | EXTENDED_RELEASE_TABLET | Freq: Every day | ORAL | 0 refills | Status: DC

## 2019-02-14 MED ORDER — POLYETHYLENE GLYCOL 3350 17 G PO PACK: 17.0000 g | PACK | Freq: Every day | ORAL | 0 refills | Status: AC

## 2019-02-14 MED ORDER — MILRINONE LACTATE IN DEXTROSE 20-5 MG/100ML-% IV SOLN: 0.2500 ug/kg/min | INTRAVENOUS | 0 refills | Status: DC

## 2019-02-14 MED ORDER — SPIRONOLACTONE 25 MG PO TABS: 12.5000 mg | ORAL_TABLET | Freq: Every day | ORAL | 0 refills | Status: DC

## 2019-02-14 MED ORDER — FLORANEX PO PACK: 1.0000 g | PACK | Freq: Three times a day (TID) | ORAL | 0 refills | Status: DC

## 2019-02-14 MED ORDER — BENZONATATE 100 MG PO CAPS: 100.0000 mg | ORAL_CAPSULE | Freq: Three times a day (TID) | ORAL | 0 refills | Status: DC

## 2019-02-14 MED ORDER — TAMSULOSIN HCL 0.4 MG PO CAPS: 0.8000 mg | ORAL_CAPSULE | Freq: Every day | ORAL | 0 refills | Status: AC

## 2019-02-14 MED ORDER — TORSEMIDE 20 MG PO TABS: 40.0000 mg | ORAL_TABLET | Freq: Two times a day (BID) | ORAL | 0 refills | Status: DC

## 2019-02-14 MED ORDER — MOMETASONE FURO-FORMOTEROL FUM 200-5 MCG/ACT IN AERO: 2.0000 | INHALATION_SPRAY | Freq: Two times a day (BID) | RESPIRATORY_TRACT | 0 refills | Status: DC

## 2019-02-14 MED ORDER — SUCRALFATE 1 G PO TABS: 1.0000 g | ORAL_TABLET | Freq: Three times a day (TID) | ORAL | 0 refills | Status: AC

## 2019-02-14 NOTE — Patient Instructions (Signed)
Labs were done today. We will call you with any ABNORMAL results. No news is good news!  Take Metolazone 2.5 mg (1 tab) weekly on Saturdays. Take additional 40mg  Potassium when taking this medication.  Take extra Metolazone 2.5 (1 tab) on 02/16/2019 with an extra 40mg  of potassium.

## 2019-02-14 NOTE — Progress Notes (Signed)
Location:  [facility] Room Number: 353I Place of Service:  SNF (31)  Spencer Delaine. Sheppard Coil, MD  Patient Care Team: [name]., MD as PCP - General (Internal Medicine)  Extended Emergency Contact Information Primary Emergency Contact: Narula,Mozelle Address: [address] [place], [place] [address] [place] Phone: [phone] Relation: Spouse Secondary Emergency Contact: Symmonds,Mehul D.  [place] [employer]: [phone] Relation: Son  No Known Allergies  Chief Complaint  Patient presents with   Discharge Note    Discharge from [facility]    HPI:  78 y.o. 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  Patient was admitted to [facility] from 1/26-2/26 for volume overload and cardiogenic shock.  Initially diuresed with IV Lasix but creatinine trended up.  PICC line was placed on 1/27 with CVP of 26.  Milrinone was started with improved diuresis and improve renal function.  Echo with ejection fraction of 10%.  Patient continued diuresis well and then developed a spontaneous retroperitoneal bleed with symptomatic anemia complicating his admission.  Patient had been considered for LVAD but this was put on hold for patient to be in rehab for several weeks on inotrope support and then reassess.  LV thrombus was not seen on TEE this admission so anticoagulation was held.  Hospital course was  initially complicated by Klebsiella pneumonia and UTI treated with Rocephin.  Patient was admitted for OT/PT and is now ready to be discharged home.    Past Medical History:  Diagnosis Date   AICD (automatic cardioverter/defibrillator) present    Cancer of left kidney ([place])    S/P OR 05/2014   CHF (congestive heart failure) (HCC)    CKD (chronic kidney disease)    COPD (chronic obstructive pulmonary disease) (HCC)    oxygen dependent   Coronary artery disease    DVT (deep venous thrombosis) (HCC) 1983   LLE   GERD (gastroesophageal reflux disease)    History of hiatal hernia    Hypertension     Past Surgical History:  Procedure Laterality Date   CARDIAC CATHETERIZATION  1980's   IMPLANTABLE CARDIOVERTER DEFIBRILLATOR IMPLANT  07/21/2010   IR FLUORO GUIDE CV LINE RIGHT  01/23/2019   IR GENERIC HISTORICAL  12/23/2014   IR RADIOLOGIST EVAL & MGMT 12/23/2014 [name], MD [facility] INTERV RAD   IR GENERIC HISTORICAL  07/20/2016   IR RADIOLOGIST EVAL & MGMT 07/20/2016 [facility] INTERV RAD   IR RADIOLOGIST EVAL & MGMT  07/04/2017   IR US GUIDE VASC ACCESS RIGHT  01/23/2019   RENAL CRYOABLATION Left 05/2014   RIGHT/LEFT HEART CATH AND CORONARY ANGIOGRAPHY N/A 01/07/2019   Procedure: RIGHT/LEFT HEART CATH AND CORONARY ANGIOGRAPHY;  Surgeon: Jolaine Artist, MD;  Location: [facility];  Service: Cardiovascular;  Laterality: N/A;     reports that he quit smoking about 42 years ago. His smoking use included cigarettes. He started smoking about 59 years  ago. He has a 12.75 pack-year smoking history. He has never used smokeless tobacco. He reports that he does not drink alcohol or use drugs. Social History   Socioeconomic History   Marital status: Married    Spouse name: Not on file   Number of children: Not on file   Years of education: Not on file   Highest education level: Not on file  Occupational History   Not on file  Social Needs   Financial resource  strain: Not on file   Food insecurity:    Worry: Not on file    Inability: Not on file   Transportation needs:    Medical: Not on file    Non-medical: Not on file  Tobacco Use   Smoking status: Former Smoker    Packs/day: 0.75    Years: 17.00    Pack years: 12.75    Types: Cigarettes    Start date: 05/08/1959    Last attempt to quit: 06/20/1976    Years since quitting: 42.6   Smokeless tobacco: Never Used  Substance and Sexual Activity   Alcohol use: No   Drug use: No   Sexual activity: Not on file  Lifestyle   Physical activity:    Days per week: Not on file    Minutes per session: Not on file   Stress: Not on file  Relationships   Social connections:    Talks on phone: Not on file    Gets together: Not on file    Attends religious service: Not on file    Active member of club or organization: Not on file    Attends meetings of clubs or organizations: Not on file    Relationship status: Not on file   Intimate partner violence:    Fear of current or ex partner: Not on file    Emotionally abused: Not on file    Physically abused: Not on file    Forced sexual activity: Not on file  Other Topics Concern   Not on file  Social History Narrative   Not on file    Pertinent  Health Maintenance Due  Topic Date Due   INFLUENZA VACCINE  02/21/2019 (Originally 07/04/2018)   URINE MICROALBUMIN  02/28/2019 (Originally 07/16/1951)   PNA vac Low Risk Adult (1 of 2 - PCV13) 02/28/2019 (Originally 07/15/2006)    Medications: Allergies as of 02/14/2019   No Known Allergies     Medication List       Accurate as of February 14, 2019  4:14 PM. Always use your most recent med list.        acetaminophen 325 MG tablet Commonly known as:  TYLENOL Take 2 tablets (650 mg total) by mouth every 4 (four) hours as needed for headache or mild pain.   amiodarone 200 MG tablet Commonly known as:  PACERONE Take 200 mg by mouth daily.   benzonatate 100 MG capsule Commonly  known as:  TESSALON Take 1 capsule (100 mg total) by mouth every 8 (eight) hours.   Dulera 200-5 MCG/ACT Aero Generic drug:  mometasone-formoterol Inhale 2 puffs into the lungs 2 (two) times daily.   lactobacillus Pack Take 1 packet (1 g total) by mouth 3 (three) times daily with meals.   milrinone 20 MG/100 ML Soln infusion Commonly known as:  PRIMACOR Inject 0.0205 mg/min into the vein continuous.   OXYGEN Inhale 2 L into the lungs continuous.   pantoprazole 40 MG tablet Commonly known as:  PROTONIX Take 1 tablet (40  mg total) by mouth daily.   polyethylene glycol packet Commonly known as:  MIRALAX / GLYCOLAX Take 17 g by mouth daily.   potassium chloride SA 20 MEQ tablet Commonly known as:  K-DUR,KLOR-CON Take 2 tablets (40 mEq total) by mouth daily.   spironolactone 25 MG tablet Commonly known as:  ALDACTONE Take 0.5 tablets (12.5 mg total) by mouth at bedtime.   sucralfate 1 g tablet Commonly known as:  Carafate Take 1 tablet (1 g total) by mouth 4 (four) times daily -  with meals and at bedtime.   tamsulosin 0.4 MG Caps capsule Commonly known as:  FLOMAX Take 2 capsules (0.8 mg total) by mouth daily after supper.   torsemide 20 MG tablet Commonly known as:  DEMADEX Take 2 tablets (40 mg total) by mouth 2 (two) times daily.        Vitals:   02/14/19 1604  BP: 131/66  Pulse: 70  Resp: 18  Temp: 98.3 F (36.8 C)  Weight: 179 lb (81.2 kg)  Height: 6\' 1"  (1.854 m)   Body mass index is 23.62 kg/m.  Physical Exam  GENERAL APPEARANCE: Alert, conversant. No acute distress.  HEENT: Unremarkable. RESPIRATORY: Breathing is even, unlabored. Lung sounds are clear   CARDIOVASCULAR: Heart RRR 2/6 low pitched murmur, no rubs or gallops. No peripheral edema.  GASTROINTESTINAL: Abdomen is soft, non-tender, not distended w/ normal bowel sounds.  NEUROLOGIC: Cranial nerves 2-12 grossly intact. Moves all extremities   Labs reviewed: Basic Metabolic  Panel: Recent Labs    01/28/19 0401 01/29/19 0530  02/06/19 1850 02/11/19 2141 02/14/19 1422  NA 132* 134*   < > 132* 132* 135  K 3.8 3.2*   < > 4.9 4.0 3.9  CL 94* 96*   < > 102 97* 101  CO2 26 28   < > 25 24 23   GLUCOSE 117* 159*   < > 125* 128* 113*  BUN 47* 43*   < > 39* 30* 25*  CREATININE 1.41* 1.41*   < > 1.66* 1.55* 1.25*  CALCIUM 8.8* 8.4*   < > 8.8* 8.9 9.0  MG 2.3 1.9  --  2.2  --   --    < > = values in this interval not displayed.   No results found for: [facility] Liver Function Tests: Recent Labs    02/06/19 1850 02/11/19 2141 02/14/19 1422  AST 46* 32 31  ALT 18 14 14   ALKPHOS 118 112 100  BILITOT 2.4* 2.6* 1.7*  PROT 7.8 7.2 7.2  ALBUMIN 3.0* 2.9* 2.9*   Recent Labs    05/03/18 1523 06/11/18 1740  LIPASE 42 42   Recent Labs    01/04/19 0547  AMMONIA 41*   CBC: Recent Labs    12/29/18 1942  01/06/19 1940  02/04/19 1112 02/05/19 02/06/19 1850 02/11/19 2141  WBC 5.7   < > 4.5   < > 5.9 5.2   5.2 7.3 5.8  NEUTROABS 4.0  --  2.9  --   --   --   --  4.3  HGB 13.6   < > 12.0*   < > 12.0* 11.4* 12.5* 11.6*  HCT 43.1   < > 36.9*   < > 37.5* 35* 38.6* 35.5*  MCV 81.9   < > 80.9   < > 85.6  --  86.2 86.0  PLT 144*   < > 125*   < > 179 166 167 126*   < > = values in this interval  not displayed.   Lipid Recent Labs    01/06/19 1650  CHOL 98  HDL 46  LDLCALC 41  TRIG 57   Cardiac Enzymes: Recent Labs    12/04/18 1743 12/28/18 1821 12/29/18 1942  TROPONINI 0.06* 0.05* 0.05*   BNP: Recent Labs    12/28/18 1821 12/29/18 1943 02/06/19 1850  BNP >4,925.0* >4,500.0* 2,172.9*   CBG: Recent Labs    05/04/18 2147 05/05/18 0706 01/17/19 1220  GLUCAP 157* 108* 183*    Procedures and Imaging Studies During Stay: Ct Abdomen Pelvis Wo Contrast  Result Date: 02/11/2019 CLINICAL DATA:  78 year old male with LEFT LOWER quadrant abdominal pain. History of recent retroperitoneal hematoma. EXAM: CT ABDOMEN AND PELVIS WITHOUT CONTRAST  TECHNIQUE: Multidetector CT imaging of the abdomen and pelvis was performed following the standard protocol without IV contrast. COMPARISON:  01/13/2019 CT FINDINGS: Please note that parenchymal abnormalities may be missed without intravenous contrast. Lower chest: Marked cardiomegaly again noted. A small RIGHT pleural effusion has slightly increased. No definite pericardial effusion. Hepatobiliary: The liver and gallbladder are unremarkable. No biliary dilatation. Pancreas: Unremarkable Spleen: Unremarkable Adrenals/Urinary Tract: No renal changes. LEFT renal ablation site again noted as well as a LEFT renal cyst. Stomach/Bowel: No bowel obstruction, definite bowel wall thickening or inflammatory changes. Vascular/Lymphatic: Aortic atherosclerosis. No enlarged abdominal or pelvic lymph nodes. Reproductive: Unremarkable Other: A LEFT retroperitoneal hematoma has decreased in size, now measuring 5.4 x 8.8 cm, previously 9.5 x 10 cm at the same level. Hematoma in the LEFT inguinal canal now measures 2.7 cm, previously 3.2 cm. No new hematoma identified.  No ascites or pneumoperitoneum. Musculoskeletal: No acute or suspicious bony abnormalities. IMPRESSION: 1. Decreased size of LEFT retroperitoneal and LEFT inguinal hematomas since 01/13/2019. 2. Slightly enlarging small RIGHT pleural effusion. 3. Marked cardiomegaly again noted. 4.  Aortic Atherosclerosis (ICD10-I70.0). Electronically Signed   By: Margarette Canada M.D.   On: 02/11/2019 21:22   Ir Fluoro Guide Cv Line Right  Result Date: 01/23/2019 CLINICAL DATA:  CHF with need of durable venous access for IV therapy EXAM: TUNNELED CENTRAL VENOUS CATHETER PLACEMENT WITH ULTRASOUND AND FLUOROSCOPIC GUIDANCE TECHNIQUE: The procedure, risks, benefits, and alternatives were explained to the patient. Questions regarding the procedure were encouraged and answered. The patient understands and consents to the procedure. Patency of the right IJ vein was confirmed with ultrasound  with image documentation. An appropriate skin site was determined. Region was prepped using maximum barrier technique including cap and mask, sterile gown, sterile gloves, large sterile sheet, and Chlorhexidine as cutaneous antisepsis. The region was infiltrated locally with 1% lidocaine. Under real-time ultrasound guidance, the right IJ vein was accessed with a 21 gauge micropuncture needle; the needle tip within the vein was confirmed with ultrasound image documentation. 50F single-lumen cuffed powerPICC tunneled from a right anterior chest wall approach to the dermatotomy site. Needle exchanged over the 018 guidewire for transitional dilator, through which the catheter which had been cut to 27 cm was advanced under intermittent fluoroscopy, positioned with its tip at the cavoatrial junction. Spot chest radiograph confirms good catheter position. No pneumothorax. Catheter was flushed per protocol. Catheter secured externally with O Prolene suture. The right IJ dermatotomy site was closed with Dermabond. COMPLICATIONS: COMPLICATIONS None immediate FLUOROSCOPY TIME:  12 seconds; 1 mGy COMPARISON:  None IMPRESSION: 1. Technically successful placement of tunneled right IJ tunneled single-lumen power injectable catheter with ultrasound and fluoroscopic guidance. Ready for routine use. Electronically Signed   By: Lucrezia Europe M.D.   On: 01/23/2019  13:24   Ir US Guide Vasc Access Right  Result Date: 01/23/2019 CLINICAL DATA:  CHF with need of durable venous access for IV therapy EXAM: TUNNELED CENTRAL VENOUS CATHETER PLACEMENT WITH ULTRASOUND AND FLUOROSCOPIC GUIDANCE TECHNIQUE: The procedure, risks, benefits, and alternatives were explained to the patient. Questions regarding the procedure were encouraged and answered. The patient understands and consents to the procedure. Patency of the right IJ vein was confirmed with ultrasound with image documentation. An appropriate skin site was determined. Region was prepped  using maximum barrier technique including cap and mask, sterile gown, sterile gloves, large sterile sheet, and Chlorhexidine as cutaneous antisepsis. The region was infiltrated locally with 1% lidocaine. Under real-time ultrasound guidance, the right IJ vein was accessed with a 21 gauge micropuncture needle; the needle tip within the vein was confirmed with ultrasound image documentation. 61F single-lumen cuffed powerPICC tunneled from a right anterior chest wall approach to the dermatotomy site. Needle exchanged over the 018 guidewire for transitional dilator, through which the catheter which had been cut to 27 cm was advanced under intermittent fluoroscopy, positioned with its tip at the cavoatrial junction. Spot chest radiograph confirms good catheter position. No pneumothorax. Catheter was flushed per protocol. Catheter secured externally with O Prolene suture. The right IJ dermatotomy site was closed with Dermabond. COMPLICATIONS: COMPLICATIONS None immediate FLUOROSCOPY TIME:  12 seconds; 1 mGy COMPARISON:  None IMPRESSION: 1. Technically successful placement of tunneled right IJ tunneled single-lumen power injectable catheter with ultrasound and fluoroscopic guidance. Ready for routine use. Electronically Signed   By: Lucrezia Europe M.D.   On: 01/23/2019 13:24   Dg Chest Portable 1 View  Result Date: 02/06/2019 CLINICAL DATA:  Awoke with shortness of breath. History of CHF. EXAM: PORTABLE CHEST 1 VIEW COMPARISON:  Radiograph 01/11/2019. FINDINGS: Marked cardiomegaly, unchanged from prior exam. Left-sided pacemaker remains in place. Right internal jugular central venous catheter tip in the lower SVC. Improved pulmonary edema from prior exam. Patchy infrahilar opacities may be atelectasis or residual pulmonary edema. Small right pleural effusion. No pneumothorax. IMPRESSION: 1. Improved pulmonary edema from prior exam 1 month ago. Patchy infrahilar opacities may be residual pulmonary edema or atelectasis. Small  right pleural effusion, similar or improved. 2. Stable marked cardiomegaly. Electronically Signed   By: Keith Rake M.D.   On: 02/06/2019 19:14   Vas US Doppler Pre Vad  Result Date: 01/19/2019 PERIOPERATIVE VASCULAR EVALUATION Indications: Pre Op. Performing Technologist: June Leap Rdms, Rvt  Examination Guidelines: A complete evaluation includes B-mode imaging, spectral Doppler, color Doppler, and power Doppler as needed of all accessible portions of each vessel. Bilateral testing is considered an integral part of a complete examination. Limited examinations for reoccurring indications may be performed as noted.  Right Carotid Findings: +----------+--------+--------+--------+---------------------+--------+             PSV cm/s EDV cm/s Stenosis Describe              Comments  +----------+--------+--------+--------+---------------------+--------+  CCA Prox   71       17                                                +----------+--------+--------+--------+---------------------+--------+  CCA Distal 32       11                focal and hyperechoic           +----------+--------+--------+--------+---------------------+--------+  ICA Prox   39       12                                                +----------+--------+--------+--------+---------------------+--------+  ICA Mid    58       20                                                +----------+--------+--------+--------+---------------------+--------+  ICA Distal 106      39                                                +----------+--------+--------+--------+---------------------+--------+  ECA        93       11                                                +----------+--------+--------+--------+---------------------+--------+ Portions of this table do not appear on this page. +----------+--------+-------+--------+------------+             PSV cm/s EDV cms Describe Arm Pressure  +----------+--------+-------+--------+------------+  Subclavian 89                                       +----------+--------+-------+--------+------------+ +---------+--------+--+--------+--+---------+  Vertebral PSV cm/s 72 EDV cm/s 21 Antegrade  +---------+--------+--+--------+--+---------+ Left Carotid Findings: +----------+--------+--------+--------+---------------------+--------+             PSV cm/s EDV cm/s Stenosis Describe              Comments  +----------+--------+--------+--------+---------------------+--------+  CCA Prox   67       14                                                +----------+--------+--------+--------+---------------------+--------+  CCA Distal 48       9                 focal and hyperechoic           +----------+--------+--------+--------+---------------------+--------+  ICA Prox   63       23                                                +----------+--------+--------+--------+---------------------+--------+  ICA Distal 76       17                                                +----------+--------+--------+--------+---------------------+--------+  ECA  69       10                                                +----------+--------+--------+--------+---------------------+--------+ +----------+--------+--------+--------+------------+  Subclavian PSV cm/s EDV cm/s Describe Arm Pressure  +----------+--------+--------+--------+------------+             89                         105           +----------+--------+--------+--------+------------+ +---------+--------+--+--------+--+---------+  Vertebral PSV cm/s 78 EDV cm/s 20 Antegrade  +---------+--------+--+--------+--+---------+  ABI Findings: +--------+------------------+-----+---------+----------------------------------+  Right    Rt Pressure (mmHg) Index Waveform  Comment                             +--------+------------------+-----+---------+----------------------------------+  Brachial                          triphasic BP unable to take due to PICC line   +--------+------------------+-----+---------+----------------------------------+  ATA      123                1.17  triphasic                                     +--------+------------------+-----+---------+----------------------------------+  PTA      150                1.43  triphasic                                     +--------+------------------+-----+---------+----------------------------------+ +--------+------------------+-----+---------+-------+  Left     Lt Pressure (mmHg) Index Waveform  Comment  +--------+------------------+-----+---------+-------+  Brachial 105                      triphasic          +--------+------------------+-----+---------+-------+  ATA      125                1.19  triphasic          +--------+------------------+-----+---------+-------+  PTA      111                1.06  triphasic          +--------+------------------+-----+---------+-------+  Summary: Right Carotid: Velocities in the right ICA are consistent with a 1-39% stenosis. Left Carotid: Velocities in the left ICA are consistent with a 1-39% stenosis. Vertebrals:  Bilateral vertebral arteries demonstrate antegrade flow. Subclavians: Normal flow hemodynamics were seen in bilateral subclavian              arteries.  *See table(s) above for measurements and observations. Right ABI: Resting right ankle-brachial index indicates noncompressible right lower extremity arteries. May be a false reading as waveforms normal. Left ABI: Resting left ankle-brachial index is within normal range. No evidence of significant left lower extremity arterial disease.  Electronically signed by Ruta Hinds MD on 01/19/2019 at 2:25:14 PM.    Final    Vas [place] Lower Extremity Venous (dvt)  Result Date: 01/19/2019  Lower Venous  Study Indications: Pre-op.  Performing Technologist: Antonieta Pert RDMS, RVT  Examination Guidelines: A complete evaluation includes B-mode imaging, spectral Doppler, color Doppler, and power Doppler as needed of all  accessible portions of each vessel. Bilateral testing is considered an integral part of a complete examination. Limited examinations for reoccurring indications may be performed as noted.  Right Venous Findings: +---------+---------------+---------+-----------+----------+-------+            Compressibility Phasicity Spontaneity Properties Summary  +---------+---------------+---------+-----------+----------+-------+  CFV       Full            Yes       Yes                             +---------+---------------+---------+-----------+----------+-------+  SFJ       Full                                                      +---------+---------------+---------+-----------+----------+-------+  FV Prox   Full                                                      +---------+---------------+---------+-----------+----------+-------+  FV Mid    Full                                                      +---------+---------------+---------+-----------+----------+-------+  FV Distal Full                                                      +---------+---------------+---------+-----------+----------+-------+  PFV       Full                                                      +---------+---------------+---------+-----------+----------+-------+  POP       Full            Yes       Yes                             +---------+---------------+---------+-----------+----------+-------+  PTV       Full                                                      +---------+---------------+---------+-----------+----------+-------+  PERO      Full                                                      +---------+---------------+---------+-----------+----------+-------+  GSV       Full                                                      +---------+---------------+---------+-----------+----------+-------+ Pitting edema in right calf.  Left Venous Findings: +---------+---------------+---------+-----------+----------+-------------------+             Compressibility Phasicity Spontaneity Properties Summary              +---------+---------------+---------+-----------+----------+-------------------+  CFV                       Yes       Yes                    could not tolerate                                                               compression          +---------+---------------+---------+-----------+----------+-------------------+  SFJ                       Yes       Yes                                         +---------+---------------+---------+-----------+----------+-------------------+  FV Prox   Full                                                                  +---------+---------------+---------+-----------+----------+-------------------+  FV Mid    Full                                                                  +---------+---------------+---------+-----------+----------+-------------------+  FV Distal Full                                                                  +---------+---------------+---------+-----------+----------+-------------------+  PFV       Full                                                                  +---------+---------------+---------+-----------+----------+-------------------+  POP  Full            Yes       Yes                                         +---------+---------------+---------+-----------+----------+-------------------+  PTV       Full                                                                  +---------+---------------+---------+-----------+----------+-------------------+  PERO      Full                                                                  +---------+---------------+---------+-----------+----------+-------------------+  GSV       Partial                                                               +---------+---------------+---------+-----------+----------+-------------------+ Patient could not tolerate compression in the left groin area.    Summary: Right: There is no  evidence of deep vein thrombosis in the lower extremity. No cystic structure found in the popliteal fossa. Left: There is no evidence of deep vein thrombosis in the lower extremity. A cystic structure is found in the popliteal fossa. Complex bakers cyst noted in left popliteal fossa.  *See table(s) above for measurements and observations. Electronically signed by Ruta Hinds MD on 01/19/2019 at 2:24:44 PM.    Final     Assessment/Plan:   No diagnosis found.   Patient is being discharged with the following home health services: OT/PT/nursing- nursing for IV milrinone administration teaching, catheter line care, disease and medication management  Patient is being discharged with the following durable medical equipment: None  Patient has been advised to f/u with their PCP in 1-2 weeks to bring them up to date on their rehab stay.  Social services at facility was responsible for arranging this appointment.  Pt was provided with a 30 day supply of prescriptions for medications and refills must be obtained from their PCP.  For controlled substances, a more limited supply may be provided adequate until PCP appointment only.   Medications have been reconciled.  Spent greater than 30 minutes;> 50% of time with patient was spent reviewing records, labs, tests and studies, counseling and developing plan of care  Spencer Delaine. Sheppard Coil, MD

## 2019-02-14 NOTE — Progress Notes (Signed)
Advanced Heart Failure Clinic Note   PCP: [name]., MD PCP-Cardiologist: Dr Haroldine Laws  HPI:  Spencer Rice is a 77 y.o. 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  Admitted 1/26-2/26/2020 with volume overload and cardiogenic shock. Initially diuresed with IV lasix, and Creatinine trended up. PICC placed 1/27 with initial CVP 26. Initial coox 38% -> Milrinone started with improved diuresis and renal function. Required lasix gtt. Echo 01/01/19 EF 10% with severe MR/TR, biatrial enlargement, RV with moderately reduced function. Taken [facility] 01/07/19 with Mild, non-obstructive CAD, Moderate PAH with normal PAPI, and normal CO on milrinone 0.25 mcg/kg/min.   PFTs 01/10/2001 FVC 2.28 (54%), FEV 2.0 (64%), DLCO 14.64 (53%). Discussed for LVAD consideration at [facility], and had planned to move forward, but patient had spontaneous RP bleed with symptomatic anemia complicating his admission. VAD team plan is to rehab for several weeks on Inotrope support and re-assess for VAD. LV thrombus not seen on TEE this admission, so anticoagulation held in setting of RP bleed. He was also treated for UTI. He was transitioned to torsemide 40 mg BID. Med titration limited by soft BP. Discharged to SNF. Followed by [facility] for home inotropes. DC weight 177 lbs.   He presents today for 1 week follow up. Last visit UNNA boots ordered. Labs have been stable. Seen in ED 3/10 with continued left groin pain. CT abdomen/pelvis showed Decreased size of LEFT retroperitoneal and LEFT inguinal hematomas since 01/13/2019. Walking 60-120 feet with PT. He goes home Sunday from the rehab facility. He reports good UOP on the torsemide. Appetite and energy are improving. Denies CP or dizziness.  Weight up 6 lbs on our scales. He says he "doesn't need help" at home. Says he will mostly be sitting, watching TV,  and eating.   Cardiac Studies: [facility] 01/07/19  Ost RCA lesion is 50% stenosed.  Mid RCA lesion is 40% stenosed.  Prox LAD lesion is 50% stenosed.  Ost 2nd Diag to 2nd Diag lesion is 100% stenosed. Findings: Done on milrinone 0.25 mcg/kg/min RA = 7  RV = 62/8 PA = 65/26 (41) PCW = 19 Fick cardiac output/index = 5.6/2.7 PVR = 3.9 WU FA sat = 95% PA sat = 65%, 66% PaPI = 5.6 Assessment: 1. Mild non-obstructive CAD 2. Moderate PAH with normal PAPI 3. Normal CO on milrinone  ECHO 01/01/2019  EF 10% with severe MR, severe TR, Biatrial enlargement, RV moderately reduced function.   Review of systems complete and found to be negative unless listed in HPI.    Past Medical History:  Diagnosis Date  . AICD (automatic cardioverter/defibrillator) present   . Cancer of left kidney ([place])    S/P OR 05/2014  . CHF (congestive heart failure) ([place])   . CKD (chronic kidney disease)   . 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 Coronary artery disease   . DVT (deep venous thrombosis) (HCC) 1983   LLE  . GERD (gastroesophageal reflux disease)   . History of hiatal hernia   . Hypertension     Current Outpatient Medications  Medication Sig Dispense Refill  . acetaminophen (TYLENOL) 325 MG tablet Take 2 tablets (650 mg total) by mouth every 4 (four) hours as needed for headache or mild pain. (Patient taking differently: Take 650 mg by mouth every 6 (six) hours as needed for mild pain or headache. )    . amiodarone (  PACERONE) 200 MG tablet Take 200 mg by mouth daily.    . benzonatate (TESSALON) 100 MG capsule Take 1 capsule (100 mg total) by mouth every 8 (eight) hours. 21 capsule 0  . lactobacillus (FLORANEX/LACTINEX) PACK Take 1 packet (1 g total) by mouth 3 (three) times daily with meals.    . milrinone (PRIMACOR) 20 MG/100 ML SOLN infusion Inject 0.0205 mg/min into the vein continuous. 100 mL 0  . mometasone-formoterol (DULERA) 200-5 MCG/ACT AERO  Inhale 2 puffs into the lungs 2 (two) times daily.    . OXYGEN Inhale 2 L into the lungs continuous.    . pantoprazole (PROTONIX) 40 MG tablet Take 1 tablet (40 mg total) by mouth daily. 30 tablet 5  . polyethylene glycol (MIRALAX / GLYCOLAX) packet Take 17 g by mouth daily. 14 each 0  . potassium chloride SA (K-DUR,KLOR-CON) 20 MEQ tablet Take 2 tablets (40 mEq total) by mouth daily. 60 tablet 5  . spironolactone (ALDACTONE) 25 MG tablet Take 0.5 tablets (12.5 mg total) by mouth at bedtime. 15 tablet 5  . sucralfate (CARAFATE) 1 g tablet Take 1 tablet (1 g total) by mouth 4 (four) times daily -  with meals and at bedtime. 28 tablet 0  . tamsulosin (FLOMAX) 0.4 MG CAPS capsule Take 2 capsules (0.8 mg total) by mouth daily after supper. 30 capsule 5  . torsemide (DEMADEX) 20 MG tablet Take 2 tablets (40 mg total) by mouth 2 (two) times daily. 120 tablet 5   No current facility-administered medications for this encounter.    No Known Allergies  Social History   Socioeconomic History  . Marital status: Married    Spouse name: Not on file  . Number of children: Not on file  . Years of education: Not on file  . Highest education level: Not on file  Occupational History  . Not on file  Social Needs  . Financial resource strain: Not on file  . Food insecurity:    Worry: Not on file    Inability: Not on file  . Transportation needs:    Medical: Not on file    Non-medical: Not on file  Tobacco Use  . Smoking status: Former Smoker    Packs/day: 0.75    Years: 17.00    Pack years: 12.75    Types: Cigarettes    Start date: 05/08/1959    Last attempt to quit: 06/20/1976    Years since quitting: 42.6  . Smokeless tobacco: Never Used  Substance and Sexual Activity  . Alcohol use: No  . Drug use: No  . Sexual activity: Not on file  Lifestyle  . Physical activity:    Days per week: Not on file    Minutes per session: Not on file  . Stress: Not on file  Relationships  . Social  connections:    Talks on phone: Not on file    Gets together: Not on file    Attends religious service: Not on file    Active member of club or organization: Not on file    Attends meetings of clubs or organizations: Not on file    Relationship status: Not on file  . Intimate partner violence:    Fear of current or ex partner: Not on file    Emotionally abused: Not on file    Physically abused: Not on file    Forced sexual activity: Not on file  Other Topics Concern  . Not on file  Social History Narrative  .  Not on file   Family History  Problem Relation Age of Onset  . Heart disease Father   . Heart attack Father 4  . Hypertension Father   . Hypertension Mother    Vitals:   02/14/19 1338  BP: 116/72  Pulse: 94  SpO2: 93%  Weight: 81.2 kg (179 lb)   Wt Readings from Last 3 Encounters:  02/14/19 81.2 kg (179 lb)  02/10/19 78.6 kg (173 lb 3.2 oz)  02/06/19 78.6 kg (173 lb 3.2 oz)   PHYSICAL EXAM: General: Fatigued appearing.  HEENT: Normal Neck: Supple. JVP 9-10 cm. Carotids 2+ bilat; no bruits. No thyromegaly or nodule noted. Cor: PMI nondisplaced. RRR, 2/6 MR/TR, +S3, Right chest tunneled PICC.  Lungs: CTAB, normal effort. Abdomen: Soft, non-tender, non-distended. + pulsatile liver. No bruits or masses. +BS  Extremities: No cyanosis, clubbing, or rash. 1-2+ edema, unna boots in place. + dependent edema in thighs.  Neuro: Alert & orientedx3, cranial nerves grossly intact. moves all 4 extremities w/o difficulty. Affect pleasant   ASSESSMENT & PLAN:  1. Chronic Systolic Heart Failure -> recent cardiogenic shock: Due to primarily to nonischemic cardiomyopathy (out of proportion to CAD). [place] 2015 with moderate disease. Has Biotronik BiV ICD.  Initial CO-OX 38% so milrinone started on 1/28.  - ECHO 01/01/19 EF 10% with severe MR/TR, biatrial enlargement, RV with moderately reduced function.  - NYHA III. Volume status okay. Has significant peripheral edema, but weight  unchanged and JVP looks okay.  - Continue milrinone 0.25 mcg/kg/min. Managed through [facility].  - Continue torsemide 40 mg BID.  - Add metolazone 2.5 mg once a week (Saturday) with extra 40 meq of K.  - Continue spiro 12.5 mg daily - Add unna boots.  - No BB with inotrope dependence - No BP room to increase spiro or add ARB.  - Discussed at [place] on 2/10 and would be possible candidate once RP bleed improves.  - Dr Haroldine Laws discussed possibility of VAD again today if he gets stronger. Pt is unsure if he would want to go through with it, but wants to reassess once he is stronger.   2. RP bleed with symptomatic anemia - Spontaneous while on heparin (? Traumatic component). Was on heparin for h/o LV clot (no longer present) - Off AC currently. Hgb stable at 11.6 02/11/19. - Still having left groin tenderness. Says he has a bulge when he lies flat. - CT 3/10 shows hematoma continues to improve.   3. CKD Stage III, Previously perceived creatinine baseline 1.8-2.4.   - CMET today.   4. COPD on chronic home oxygen: PFTs 01/10/19 with restriction and obstruction. DLCO 53% (14.64) - No change.   5. L Kidney Cancer: Had cryoablation in 2015. No change.   6. PVCs: Started on amiodarone 2019 to suppress PVCs.  - Continue amiodarone 200 mg daily. TSH stable 01/2019. AST mildly elevated 01/14/19. ALT normal.  - 02/11/19 LFTs stable.   7. 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  - No change.   8. Pulmonary nodule: 6 mm nodule in LUL noted on CT scan.  - Recommended 6 month repeat non contrast CT. Dr Haroldine Laws discussed with Dr Prescott Gum and okay to move forward with VAD workup. No change.   9. H/O [place] Thrombus - No thrombus on most recent echo - Off AC with RP hematoma. No change.    10. Deconditioning - Remains at SNF. Slowly improving. He goes home Sunday. He says he plans on "  watching TV and eating".  - Check pre-albumin.   Add metolazone  once weekly as above. Will also have him taken an extra x 1 on Sunday 3/15. Instructions written out to SNF  Shirley Friar, PA-C 02/14/19   Patient seen and examined with the above-signed Advanced Practice Provider and/or Housestaff. I personally reviewed laboratory data, imaging studies and relevant notes. I independently examined the patient and formulated the important aspects of the plan. I have edited the note to reflect any of my changes or salient points. I have personally discussed the plan with the patient and/or family.  He remains on milrinone. Volume overloaded on exam. Has improved slightly but still very weak. Prominent s3 on exam with pulsatile liver and 2+ edema. At this point I have told him and his family that he is not a candidate for VAD as he would not survive the surgery. Will continue milrinone and have HHRN and HHPT follow him to see if we can continue to strengthen him and improve his candidacy for VAD. Unfortunately, I aslo get the sense that he is not overly motivated to get to VAD. Will check labs including pre-albumin.   Glori Bickers, MD  3:00 PM

## 2019-02-18 ENCOUNTER — Other Ambulatory Visit (HOSPITAL_COMMUNITY): Payer: Self-pay | Admitting: Student

## 2019-02-18 NOTE — Telephone Encounter (Signed)
Refill routed to prescriber

## 2019-02-25 ENCOUNTER — Encounter (HOSPITAL_COMMUNITY): Payer: Medicare Other

## 2019-03-04 ENCOUNTER — Telehealth (HOSPITAL_COMMUNITY): Payer: Self-pay | Admitting: *Deleted

## 2019-03-04 NOTE — Telephone Encounter (Signed)
Anderson Malta previously managed pt's coumadin and INR levels and wanted to see if pt was going to be put back on Coumadin or if/when INR needed to be done, pt was just d/c'd from SNF so they have not seen him in a while.   Discussed w/Dr Bensimhon, he states pt is to remain off Coumadin for now and he is not sure if/when pt will be started on it.  Anderson Malta is aware and ask that if we restart it to let her know and she will arrange INR checks.

## 2019-03-06 ENCOUNTER — Encounter (HOSPITAL_COMMUNITY): Payer: Medicare Other | Admitting: Internal Medicine

## 2019-03-06 ENCOUNTER — Ambulatory Visit (HOSPITAL_COMMUNITY): Admission: RE | Admit: 2019-03-06 | Payer: Medicare Other | Source: Ambulatory Visit

## 2019-03-06 ENCOUNTER — Other Ambulatory Visit: Payer: Self-pay

## 2019-03-11 ENCOUNTER — Other Ambulatory Visit (HOSPITAL_COMMUNITY): Payer: Self-pay | Admitting: Internal Medicine

## 2019-03-12 ENCOUNTER — Ambulatory Visit (HOSPITAL_COMMUNITY)
Admission: RE | Admit: 2019-03-12 | Discharge: 2019-03-12 | Disposition: A | Discharge status: Home / Self Care | Payer: Medicare Other | Source: Ambulatory Visit | Attending: Internal Medicine | Admitting: Internal Medicine

## 2019-03-12 ENCOUNTER — Other Ambulatory Visit: Payer: Self-pay

## 2019-03-12 DIAGNOSIS — R58 Hemorrhage, not elsewhere classified: Secondary | ICD-10-CM

## 2019-03-12 DIAGNOSIS — I5022 Chronic systolic (congestive) heart failure: Secondary | ICD-10-CM

## 2019-03-12 DIAGNOSIS — N183 Chronic kidney disease, stage 3 unspecified: Secondary | ICD-10-CM

## 2019-03-12 NOTE — Progress Notes (Signed)
Heart Failure TeleHealth Note  Due to national recommendations of social distancing due to [place] 19, Audio/video telehealth visit is felt to be most appropriate for this patient at this time.  See MyChart message from today for patient consent regarding telehealth for [facility].  Date:  03/12/2019   ID:  Spencer Rice, DOB 08-11-1941, MRN [id number]  Location: Home  Provider location: [place] Advanced Heart Failure Type of Visit: Established patient  PCP:  [name]., MD  Cardiologist:  No primary care provider on file. Primary HF: Spencer Rice  Chief Complaint: HF follow-up   History of Present Illness:  Spencer Rice is a 78 y.o. 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    . Admitted 1/26-2/26/2020 with cardiogenic shock.PICC placed 1/27 with initial CVP 26 coox 38% -> Milrinone started with improved diuresis and renal function. Echo 01/01/19 EF 10% with severe MR/TR, biatrial enlargement, RV with moderately reduced function. Taken for [facility] 01/07/19 with Mild, non-obstructive CAD, Moderate PAH with normal PAPI, and normal CO on milrinone 0.25 mcg/kg/min. PFTs 01/10/2001 FVC 2.28 (54%), FEV 2.0 (64%), DLCO 14.64 (53%). Discussed for LVADconsideration at [facility], and had planned to move forward,but patient had spontaneous RP bleed with symptomatic anemia complicating his admission.Discharged to SNF for rehab,   Remains on milrinone 0.25. Has HHRN and HHPT come once a week. Able to walk around the house for several minutes at a time. Also doing light weights and stretches. He feels like he is getting stronger. Says swelling down best they have ever been. No orthopnea or PND. Taking torsemide 40 bid. Taking metolazone 2.5 every Saturday. Breathing ok. No  orthopnea or PND. No problems with medications. No problem with PICC line site.   He denies symptoms worrisome for COVID 19.   Past Medical History:  Diagnosis Date  . AICD (automatic cardioverter/defibrillator) present   . Cancer of left kidney ([place])    S/P OR 05/2014  . CHF (congestive heart failure) ([place])   . CKD (chronic kidney disease)   . 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 Coronary artery disease   . DVT (deep venous thrombosis) (HCC) 1983   LLE  . GERD (gastroesophageal reflux disease)   . History of hiatal hernia   . Hypertension    Past Surgical History:  Procedure Laterality Date  . CARDIAC CATHETERIZATION  1980's  . IMPLANTABLE CARDIOVERTER DEFIBRILLATOR IMPLANT  07/21/2010  . IR FLUORO GUIDE CV LINE RIGHT  01/23/2019  . IR GENERIC HISTORICAL  12/23/2014   IR RADIOLOGIST EVAL & MGMT 12/23/2014 [name], MD [facility] INTERV RAD  . IR GENERIC HISTORICAL  07/20/2016   IR RADIOLOGIST EVAL & MGMT 07/20/2016 [facility] INTERV RAD  . IR RADIOLOGIST EVAL & MGMT  07/04/2017  . IR US GUIDE VASC ACCESS RIGHT  01/23/2019  . RENAL CRYOABLATION Left 05/2014  . RIGHT/LEFT HEART CATH AND CORONARY ANGIOGRAPHY N/A 01/07/2019   Procedure: RIGHT/LEFT HEART CATH AND CORONARY ANGIOGRAPHY;  Surgeon: Jolaine Artist, MD;  Location: [facility] CV [facility];  Service: Cardiovascular;  Laterality: N/A;     Current Outpatient Medications  Medication Sig Dispense Refill  . acetaminophen (TYLENOL) 325 MG tablet Take 2 tablets (650 mg total) by mouth every 4 (four) hours as needed for  headache or mild pain. (Patient taking differently: Take 650 mg by mouth every 6 (six) hours as needed for mild pain or headache. )    . amiodarone (PACERONE) 200 MG tablet Take 1 tablet (200 mg total) by mouth daily. 30 tablet 0  . benzonatate (TESSALON) 100 MG capsule Take 1 capsule (100 mg total) by mouth every 8 (eight) hours. 30 capsule 0  . lactobacillus (FLORANEX/LACTINEX) PACK Take  1 packet (1 g total) by mouth 3 (three) times daily with meals. 90 packet 0  . milrinone (PRIMACOR) 20 MG/100 ML SOLN infusion Inject 0.0205 mg/min into the vein continuous. 100 mL 0  . mometasone-formoterol (DULERA) 200-5 MCG/ACT AERO Inhale 2 puffs into the lungs 2 (two) times daily. 8.8 g 0  . OXYGEN Inhale 2 L into the lungs continuous.    . pantoprazole (PROTONIX) 40 MG tablet Take 1 tablet (40 mg total) by mouth daily. 30 tablet 5  . polyethylene glycol (MIRALAX / GLYCOLAX) packet Take 17 g by mouth daily. 30 each 0  . potassium chloride SA (K-DUR,KLOR-CON) 20 MEQ tablet Take 2 tablets (40 mEq total) by mouth daily. 60 tablet 0  . spironolactone (ALDACTONE) 25 MG tablet TAKE 0.5 TABLETS (12.5 MG TOTAL) BY MOUTH AT BEDTIME. 45 tablet 1  . sucralfate (CARAFATE) 1 g tablet Take 1 tablet (1 g total) by mouth 4 (four) times daily -  with meals and at bedtime. 120 tablet 0  . tamsulosin (FLOMAX) 0.4 MG CAPS capsule Take 2 capsules (0.8 mg total) by mouth daily after supper. 60 capsule 0  . torsemide (DEMADEX) 20 MG tablet TAKE 2 TABLETS (40 MG TOTAL) BY MOUTH 2 (TWO) TIMES DAILY. 360 tablet 1   No current facility-administered medications for this encounter.     Allergies:   Patient has no known allergies.   Social History:  The patient  reports that he quit smoking about 42 years ago. His smoking use included cigarettes. He started smoking about 59 years ago. He has a 12.75 pack-year smoking history. He has never used smokeless tobacco. He reports that he does not drink alcohol or use drugs.   Family History:  The patient's family history includes Heart attack (age of onset: 41) in his father; Heart disease in his father; Hypertension in his father and mother.   ROS:  Please see the history of present illness.   All other systems are personally reviewed and negative.   Exam:  (Video/Tele Health Call; Exam is subjective and or/visual.) General:  Speaks in full sentences. No resp difficulty.  Lungs: Normal respiratory effort with conversation.  Abdomen: Non-distended per patient report Extremities: Pt denies edema. Neuro: Alert & oriented x 3.   Recent Labs: 01/06/2019: TSH 0.906 02/06/2019: B Natriuretic Peptide 2,172.9; Magnesium 2.2 02/11/2019: Hemoglobin 11.6; Platelets 126 02/14/2019: ALT 14; BUN 25; Creatinine, Ser 1.25; Potassium 3.9; Sodium 135  Personally reviewed   Wt Readings from Last 3 Encounters:  02/14/19 81.2 kg (179 lb)  02/14/19 81.2 kg (179 lb)  02/10/19 78.6 kg (173 lb 3.2 oz)      ASSESSMENT AND PLAN:  1. Chronic Systolic Heart Failure ->recent cardiogenic shock: Due to primarily to nonischemic cardiomyopathy (out of proportion to CAD). [place] 2015 with moderate disease. Has Biotronik BiV ICD. Initial CO-OX 38% so milrinone started on 1/28.  -ECHO 01/01/19 EF 10% with severe MR/TR, biatrial enlargement, RV with moderately reduced function.  - Improved with inotropic support NYHA III-IIIB. Volume status improved.Weight down 11 pounds 179 -> 168 -  Continue milrinone 0.25 mcg/kg/min. Managed through [facility]. Labs every Friday with [facility].  - Continue torsemide 40 mg BID and metolazone 2.5 mg once a week (Saturday) with extra 40 meq of K.  - Continue spiro 12.5 mg daily - No BB with inotrope dependence - Discussed at VAD MRB meeting on 2/10 and would be possible VAD candidate if he gets stronger enough.  - Continue HHPT  2. RP bleed with symptomatic anemia - Spontaneous while on heparin (? Traumatic component). Was on heparin for h/o LV clot (no longer present) - Off AC currently. Follow labs  3. CKD Stage III, Previously perceived creatinine baseline 1.8-2.4.  - Labs with [facility]   COVID screen The patient does not have any symptoms that suggest any further testing/ screening at this time.  Social distancing reinforced today.  Recommended follow-up:  As above  Relevant cardiac medications were reviewed at length with the patient today.   The patient does  not have concerns regarding their medications at this time.   The following changes were made today:  As above  Today, I have spent 22 minutes with the patient with telehealth technology discussing the above issues .    Signed, Glori Bickers, MD  03/12/2019 4:57 PM  [place] [address] [place] [address] [phone] (office) [phone] (fax)

## 2019-03-19 ENCOUNTER — Other Ambulatory Visit: Payer: Self-pay | Admitting: Internal Medicine

## 2019-03-21 ENCOUNTER — Other Ambulatory Visit (HOSPITAL_COMMUNITY): Payer: Self-pay | Admitting: Student

## 2019-03-21 ENCOUNTER — Other Ambulatory Visit (HOSPITAL_COMMUNITY): Payer: Self-pay | Admitting: *Deleted

## 2019-03-21 ENCOUNTER — Telehealth (HOSPITAL_COMMUNITY): Payer: Self-pay | Admitting: Surgery

## 2019-03-21 MED ORDER — POTASSIUM CHLORIDE CRYS ER 20 MEQ PO TBCR: 40.0000 meq | EXTENDED_RELEASE_TABLET | Freq: Every day | ORAL | 0 refills | Status: DC

## 2019-03-21 NOTE — Telephone Encounter (Signed)
Patient will be enrolled in the [place].  All appropriate paperwork has been send via secure email to paramedic team.

## 2019-03-25 ENCOUNTER — Other Ambulatory Visit (HOSPITAL_COMMUNITY): Payer: Self-pay | Admitting: Internal Medicine

## 2019-03-25 ENCOUNTER — Telehealth (HOSPITAL_COMMUNITY): Payer: Self-pay

## 2019-03-25 NOTE — Telephone Encounter (Signed)
I called Spencer Rice to schedule an initial appointment. He stated he would be home all day so I could come by at any time. I advised I would be there around 14:00 and he was agreeable.

## 2019-03-26 ENCOUNTER — Other Ambulatory Visit (HOSPITAL_COMMUNITY): Payer: Self-pay

## 2019-03-27 ENCOUNTER — Telehealth (HOSPITAL_COMMUNITY): Payer: Self-pay | Admitting: *Deleted

## 2019-03-27 NOTE — Progress Notes (Signed)
I arrived to see [name] at approximately 10 minutes to 15:00. He answered the door and immediately began shouting at me through the storm door about being late. I apologized for being last and explained that I had another patient who required a longer visit than anticipated but he stated he did not care and refused to meet with me today. He then stated that he was not going to pay for someone how is late to an appointment to see him. I explained that this service does not cost him any money but he still refused to see me. [name] was very disrespectful to me and said if I wanted to see him, I should try another time. I told him that I did not think it was a good idea for me to come back and he expressed he did not care if anybody comes or not. I reached out to [facility] and told explained the situation and advised that I don't think moving forward with [name] would be a good idea. She was understanding and advised we will remove him from the paramedicine program.

## 2019-03-27 NOTE — Telephone Encounter (Signed)
Pt had a virtual visit with PCP today. She called to give [place] an update pt has not been taking spironolactone they had him start it today. In the past patient had aki when ever they would start spironolactone. Pt does not currently have metolazone in the home and she recommended being very cautious with prescribing it to him as he has trouble with taking correct medications. Pt has refused paramedicine and home health. She wants pt to be seen in her clinic only as needed but pt has refused as he was told to stay indoors because of covid. She is afraid that he may make  medication dosage errors if he is does not see home health, paramedicine, or in office visits. She asks that we continue to educate patient on medication and the need for assistance with managing med box and vitals. I told her I would route this to [place] as an fyi.   Lauren,PA (PCP) call back #[phone]

## 2019-04-02 ENCOUNTER — Other Ambulatory Visit: Payer: Self-pay | Admitting: Internal Medicine

## 2019-04-11 ENCOUNTER — Ambulatory Visit (HOSPITAL_COMMUNITY)
Admission: RE | Admit: 2019-04-11 | Discharge: 2019-04-11 | Disposition: A | Discharge status: Home / Self Care | Payer: Medicare Other | Source: Ambulatory Visit | Attending: Internal Medicine | Admitting: Internal Medicine

## 2019-04-11 ENCOUNTER — Other Ambulatory Visit: Payer: Self-pay

## 2019-04-11 DIAGNOSIS — I5022 Chronic systolic (congestive) heart failure: Secondary | ICD-10-CM

## 2019-04-11 DIAGNOSIS — N183 Chronic kidney disease, stage 3 unspecified: Secondary | ICD-10-CM

## 2019-04-11 NOTE — Progress Notes (Signed)
Heart Failure TeleHealth Note  Due to national recommendations of social distancing due to [place] 19, Audio/video telehealth visit is felt to be most appropriate for this patient at this time.  See MyChart message from today for patient consent regarding telehealth for [facility].  Date:  04/11/2019   ID:  Spencer Rice, DOB 1941-04-29, MRN [id number]  Location: Home  Provider location: [place] [facility] Type of Visit: Established patient  PCP:  [name]., MD  Cardiologist:  No primary care provider on file. Primary HF: [name]  Chief Complaint: HF follow-up   History of Present Illness:  Spencer Rice is a 78 y.o. 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    . Admitted 1/26-2/26/2020 with cardiogenic shock.PICC placed 1/27 with initial CVP 26 coox 38% -> Milrinone started with improved diuresis and renal function. Echo 01/01/19 EF 10% with severe MR/TR, biatrial enlargement, RV with moderately reduced function. Taken [name] 01/07/19 with Mild, non-obstructive CAD, Moderate PAH with normal PAPI, and normal CO on milrinone 0.25 mcg/kg/min. PFTs 01/10/2001 FVC 2.28 (54%), FEV 2.0 (64%), DLCO 14.64 (53%). Discussed for LVADconsideration at [facility], and had planned to move forward,but patient had spontaneous RP bleed with symptomatic anemia complicating his admission.Discharged to SNF for rehab,   She presents via audio/video conferencing for a telehealth visit today. Remains on milrinone 0.25. Has HHRN and HHPT come once a week. Says he is doing pretty good. Walking around the house. Feels much stronger. Also doing light weights and stretches. No edema, orthopnea or PND.  Taking torsemide 40 daily (down from bid). Taking metolazone 2.5  every Saturday. No problems with medications. No problem with PICC line site.  Labs 03/14/19: Creatinine 1.79   He denies symptoms worrisome for COVID 19.   Past Medical History:  Diagnosis Date  . AICD (automatic cardioverter/defibrillator) present   . Cancer of left kidney ([place])    S/P OR 05/2014  . CHF (congestive heart failure) ([place])   . CKD (chronic kidney disease)   . 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 Coronary artery disease   . DVT (deep venous thrombosis) (H[facility]) 1983   LLE  . GERD (gastroesophageal reflux disease)   . History of hiatal hernia   . Hypertension    Past Surgical History:  Procedure Laterality Date  . CARDIAC CATHETERIZATION  1980's  . IMPLANTABLE CARDIOVERTER DEFIBRILLATOR IMPLANT  07/21/2010  . IR FLUORO GUIDE CV LINE RIGHT  01/23/2019  . IR GENERIC HISTORICAL  12/23/2014   IR RADIOLOGIST EVAL & MGMT 12/23/2014 [name], MD [facility] INTERV RAD  . IR GENERIC HISTORICAL  07/20/2016   IR RADIOLOGIST EVAL & MGMT 07/20/2016 [facility] INTERV RAD  . IR RADIOLOGIST EVAL & MGMT  07/04/2017  . IR US GUIDE VASC ACCESS RIGHT  01/23/2019  . RENAL CRYOABLATION Left 05/2014  . RIGHT/LEFT HEART CATH AND CORONARY ANGIOGRAPHY N/A 01/07/2019   Procedure: RIGHT/LEFT HEART CATH AND CORONARY ANGIOGRAPHY;  Surgeon: Jolaine Artist, MD;  Location: [place] CV LAB;  Service: Cardiovascular;  Laterality: N/A;     Current Outpatient Medications  Medication Sig Dispense Refill  . acetaminophen (TYLENOL) 325 MG tablet Take 2 tablets (650 mg total) by mouth every 4 (four) hours as needed for  headache or mild pain. (Patient taking differently: Take 650 mg by mouth every 6 (six) hours as needed for mild pain or headache. )    . amiodarone (PACERONE) 200 MG tablet Take 1 tablet (200 mg total) by mouth daily. 30 tablet 0  . benzonatate (TESSALON) 100 MG capsule Take 1 capsule (100 mg total) by mouth every 8 (eight) hours. 30 capsule 0  . KLOR-CON M20  20 MEQ tablet TAKE 2 TABLETS BY MOUTH DAILY 180 tablet 1  . lactobacillus (FLORANEX/LACTINEX) PACK Take 1 packet (1 g total) by mouth 3 (three) times daily with meals. 90 packet 0  . milrinone (PRIMACOR) 20 MG/100 ML SOLN infusion Inject 0.0205 mg/min into the vein continuous. 100 mL 0  . mometasone-formoterol (DULERA) 200-5 MCG/ACT AERO Inhale 2 puffs into the lungs 2 (two) times daily. 8.8 g 0  . OXYGEN Inhale 2 L into the lungs continuous.    . pantoprazole (PROTONIX) 40 MG tablet Take 1 tablet (40 mg total) by mouth daily. 30 tablet 5  . polyethylene glycol (MIRALAX / GLYCOLAX) packet Take 17 g by mouth daily. 30 each 0  . spironolactone (ALDACTONE) 25 MG tablet TAKE 0.5 TABLETS (12.5 MG TOTAL) BY MOUTH AT BEDTIME. 45 tablet 1  . sucralfate (CARAFATE) 1 g tablet Take 1 tablet (1 g total) by mouth 4 (four) times daily -  with meals and at bedtime. 120 tablet 0  . tamsulosin (FLOMAX) 0.4 MG CAPS capsule Take 2 capsules (0.8 mg total) by mouth daily after supper. 60 capsule 0  . torsemide (DEMADEX) 20 MG tablet TAKE 2 TABLETS (40 MG TOTAL) BY MOUTH 2 (TWO) TIMES DAILY. 360 tablet 1   No current facility-administered medications for this encounter.     Allergies:   Patient has no known allergies.   Social History:  The patient  reports that he quit smoking about 42 years ago. His smoking use included cigarettes. He started smoking about 59 years ago. He has a 12.75 pack-year smoking history. He has never used smokeless tobacco. He reports that he does not drink alcohol or use drugs.   Family History:  The patient's family history includes Heart attack (age of onset: 44) in his father; Heart disease in his father; Hypertension in his father and mother.   ROS:  Please see the history of present illness.   All other systems are personally reviewed and negative.   Exam:  (Video/Tele Health Call; Exam is subjective and or/visual.) General:  Speaks in full sentences. No resp difficulty. Lungs:  Normal respiratory effort with conversation.  Abdomen: Non-distended per patient report Extremities: Pt denies edema. Neuro: Alert & oriented x 3.   Recent Labs: 01/06/2019: TSH 0.906 02/06/2019: B Natriuretic Peptide 2,172.9; Magnesium 2.2 02/11/2019: Hemoglobin 11.6; Platelets 126 02/14/2019: ALT 14; BUN 25; Creatinine, Ser 1.25; Potassium 3.9; Sodium 135  Personally reviewed   Wt Readings from Last 3 Encounters:  02/14/19 81.2 kg (179 lb)  02/14/19 81.2 kg (179 lb)  02/10/19 78.6 kg (173 lb 3.2 oz)      ASSESSMENT AND PLAN:  1. Chronic Systolic Heart Failure ->recent cardiogenic shock: Due to primarily to nonischemic cardiomyopathy (out of proportion to CAD). [place] 2015 with moderate disease. Has Biotronik BiV ICD. Initial CO-OX 38% so milrinone started on 1/28.  -ECHO 01/01/19 EF 10% with severe MR/TR, biatrial enlargement, RV with moderately reduced function.  - Improved with inotropic support NYHA III-IIIB.  - Continue milrinone 0.25 mcg/kg/min. Managed through [facility]. Labs every Friday with [facility].  -  Continue torsemide 40 mg daily and metolazone 2.5 mg once a week (Saturday) with extra 40 meq of K. Labs stable per [facility].  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 He says he is feeling better but not ready for VAD yet.  - Continue HHPT  2. RP bleed with symptomatic anemia - Spontaneous while on heparin (? Traumatic component). Was on heparin for h/o LV clot (no longer present) - Off AC currently. Follow labs  3. CKD Stage III, Previously creatinine baseline 1.8-2.4.  - Continue labs with [facility]   COVID screen The patient does not have any symptoms that suggest any further testing/ screening at this time.  Social distancing reinforced today.  Recommended follow-up:  As above  Relevant cardiac medications were reviewed at length with the patient today.   The  patient does not have concerns regarding their medications at this time.   The following changes were made today:  As above  Today, I have spent 13 minutes with the patient with telehealth technology discussing the above issues .    Signed, Glori Bickers, MD  04/11/2019 11:51 AM  [facility] [place] [address] Heart and [place] [address] [phone] (office) [phone] (fax)

## 2019-04-11 NOTE — Addendum Note (Signed)
Encounter addended by: Scarlette Calico, RN on: 04/11/2019 12:42 PM  Actions taken: Clinical Note Signed

## 2019-04-11 NOTE — Progress Notes (Signed)
AVS mailed to pt.

## 2019-04-11 NOTE — Patient Instructions (Signed)
Continue current medications.  Your physician recommends that you schedule a follow-up appointment in: 3 weeks with telehealth visit with Dr Haroldine Laws, our office will call you to schedule this appointment  If you have any questions or concerns before your next appointment please send [place] a message through [facility] or call our office at [phone].

## 2019-04-14 ENCOUNTER — Other Ambulatory Visit (HOSPITAL_COMMUNITY): Payer: Self-pay | Admitting: Internal Medicine

## 2019-04-15 ENCOUNTER — Other Ambulatory Visit (HOSPITAL_COMMUNITY): Payer: Self-pay | Admitting: Internal Medicine

## 2019-04-16 ENCOUNTER — Telehealth (HOSPITAL_COMMUNITY): Payer: Self-pay | Admitting: Cardiology

## 2019-04-16 NOTE — Telephone Encounter (Signed)
Abnormal labs received Labs drawn 5/8 Resulted 5/11  Cr 2.17  Per Lillia Mountain, NP Will need repeat at the end of this week or early next  Order sent to [facility] for repeat labs

## 2019-04-18 ENCOUNTER — Other Ambulatory Visit (HOSPITAL_COMMUNITY): Payer: Self-pay | Admitting: Internal Medicine

## 2019-04-19 DIAGNOSIS — I2510 Atherosclerotic heart disease of native coronary artery without angina pectoris: Secondary | ICD-10-CM | POA: Diagnosis not present

## 2019-04-19 DIAGNOSIS — I5023 Acute on chronic systolic (congestive) heart failure: Secondary | ICD-10-CM | POA: Diagnosis not present

## 2019-04-19 DIAGNOSIS — I130 Hypertensive heart and chronic kidney disease with heart failure and stage 1 through stage 4 chronic kidney disease, or unspecified chronic kidney disease: Secondary | ICD-10-CM | POA: Diagnosis not present

## 2019-04-21 ENCOUNTER — Telehealth (HOSPITAL_COMMUNITY): Payer: Self-pay | Admitting: *Deleted

## 2019-04-21 MED ORDER — TORSEMIDE 20 MG PO TABS: 40.0000 mg | ORAL_TABLET | Freq: Every day | ORAL | 1 refills | Status: DC

## 2019-04-21 NOTE — Telephone Encounter (Signed)
Pt called c/o wt loss.  He states last week he weighed 171 which is his normal wt however it has been decreasing since then and today he is at 163 lbs.  He denies any dizziness, lightheadedness, sob or edema.  He states he has a good appetite and has been eating well.  He states he has been taking Torsemide 40 mg BID and metolazone every Saturday.  Upon review of chart last OV note from Dr Haroldine Laws states pt's Torsemide was decreased to 40 mg DAILY, pt states he doesn't know anything about that and has been taking 40 mg BID for as long as he remembers.  Advised pt to decrease Torsemide to 40 mg DAILY for now, if wt starts jumping up or develops other symptoms advised him to call us back, he is agreeable.

## 2019-04-23 NOTE — Telephone Encounter (Signed)
Left message to call back  

## 2019-04-23 NOTE — Telephone Encounter (Signed)
Let's hold metolazone for this week too.

## 2019-05-01 ENCOUNTER — Telehealth (HOSPITAL_COMMUNITY): Payer: Self-pay | Admitting: Cardiology

## 2019-05-01 ENCOUNTER — Other Ambulatory Visit: Payer: Self-pay

## 2019-05-01 ENCOUNTER — Ambulatory Visit (HOSPITAL_COMMUNITY): Admission: RE | Admit: 2019-05-01 | Payer: Medicare Other | Source: Ambulatory Visit | Admitting: Internal Medicine

## 2019-05-02 ENCOUNTER — Telehealth (HOSPITAL_COMMUNITY): Payer: Self-pay | Admitting: *Deleted

## 2019-05-02 ENCOUNTER — Ambulatory Visit (HOSPITAL_COMMUNITY)
Admission: RE | Admit: 2019-05-02 | Discharge: 2019-05-02 | Disposition: A | Discharge status: Home / Self Care | Payer: Medicare Other | Source: Ambulatory Visit | Attending: Internal Medicine | Admitting: Internal Medicine

## 2019-05-02 ENCOUNTER — Other Ambulatory Visit: Payer: Self-pay

## 2019-05-02 DIAGNOSIS — I5022 Chronic systolic (congestive) heart failure: Secondary | ICD-10-CM | POA: Diagnosis not present

## 2019-05-02 DIAGNOSIS — N183 Chronic kidney disease, stage 3 unspecified: Secondary | ICD-10-CM

## 2019-05-02 NOTE — Patient Instructions (Signed)
Continue current medications  Your physician recommends that you schedule a follow-up appointment in: 2 weeks with telehealth visit, our office will call you to schedule this appointment

## 2019-05-02 NOTE — Progress Notes (Addendum)
Message sent to schedulers, AVS mailed

## 2019-05-02 NOTE — Telephone Encounter (Signed)
HHRN called to inform us that she cant draw a bnp on patient because they require a frozen sample. In office clinical staff aware.

## 2019-05-02 NOTE — Telephone Encounter (Signed)
-   late entry Spencer Rice [phone]) with PCP called to inform office They will back off adjusting HF medications moving forward  Also patients weight was up x 10 lbs. Patient denied any other HF symptoms. advised to increase lasix to 40 mg BID   -order sent to [facility] for labs

## 2019-05-02 NOTE — Addendum Note (Signed)
Encounter addended by: Scarlette Calico, RN on: 05/02/2019 4:16 PM  Actions taken: Clinical Note Signed

## 2019-05-02 NOTE — Progress Notes (Signed)
Heart Failure TeleHealth Note  Due to national recommendations of social distancing due to [place] 19, Audio/video telehealth visit is felt to be most appropriate for this patient at this time.  See MyChart message from today for patient consent regarding telehealth for [facility].  Date:  05/02/2019   ID:  [name], DOB 1941-08-04, MRN [id number]  Location: Home  Provider location: [place] [facility] Type of Visit: Established patient  PCP:  [name]., MD  Cardiologist:  No primary care provider on file. Primary HF: [place]  Chief Complaint: HF follow-up   History of Present Illness:  Spencer Rice is a 78 y.o. 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    . Admitted 1/26-2/26/2020 with cardiogenic shock.PICC placed 1/27 with initial CVP 26 coox 38% -> Milrinone started with improved diuresis and renal function. Echo 01/01/19 EF 10% with severe MR/TR, biatrial enlargement, RV with moderately reduced function. Taken for [facility] 01/07/19 with Mild, non-obstructive CAD, Moderate PAH with normal PAPI, and normal CO on milrinone 0.25 mcg/kg/min. PFTs 01/10/2001 FVC 2.28 (54%), FEV 2.0 (64%), DLCO 14.64 (53%). Discussed for LVADconsideration at [facility], and had planned to move forward,but patient had spontaneous RP bleed with symptomatic anemia complicating his admission.Discharged to SNF for rehab,   He presents via audio/video conferencing for a telehealth visit today. Remains on milrinone 0.25. Has HHRN and HHPT come once a week. We had a telehealth visit several weeks ago and I recommended he cut torsemide from 40 bid to 40 daily - however he did not do this. When we checked in on him, his weight dropped 171 -> 163.  He denied any  dizziness, lightheadedness, sob or edema.  He states he has a good appetite and has been eating well.  He states he has been taking Torsemide 40 mg BID and metolazone every Saturday. At that point we cut torsemide to 40 daily and held metolazone. Yesterday patients weight was up 172 pounds per [facility]. Says he feels good. Has mild LE edema. No orthopnea or PND. Able to do all ADLs without problem. No problem with milrinone. Talked to his PCP and torsemide increase increased 40/20  Labs 04/11/19 Creatinine 2.17  Labs 03/14/19: Creatinine 1.79   He denies symptoms worrisome for COVID 19.   Past Medical History:  Diagnosis Date  . AICD (automatic cardioverter/defibrillator) present   . Cancer of left kidney ([place])    S/P OR 05/2014  . CHF (congestive heart failure) ([place])   . CKD (chronic kidney disease)   . 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 Coronary artery disease   . DVT (deep venous thrombosis) ([facility]) 1983   LLE  . GERD (gastroesophageal reflux disease)   . History of hiatal hernia   . Hypertension    Past Surgical History:  Procedure Laterality Date  . CARDIAC CATHETERIZATION  1980's  . IMPLANTABLE CARDIOVERTER DEFIBRILLATOR IMPLANT  07/21/2010  . IR FLUORO GUIDE CV LINE RIGHT  01/23/2019  . IR GENERIC HISTORICAL  12/23/2014   IR RADIOLOGIST EVAL & MGMT 12/23/2014 [name], MD [facility] INTERV RAD  . IR GENERIC HISTORICAL  07/20/2016   IR RADIOLOGIST EVAL & MGMT 07/20/2016 [facility] INTERV RAD  . IR RADIOLOGIST EVAL & MGMT  07/04/2017  .  IR US GUIDE VASC ACCESS RIGHT  01/23/2019  . RENAL CRYOABLATION Left 05/2014  . RIGHT/LEFT HEART CATH AND CORONARY ANGIOGRAPHY N/A 01/07/2019   Procedure: RIGHT/LEFT HEART CATH AND CORONARY ANGIOGRAPHY;  Surgeon: Jolaine Artist, MD;  Location: [place] CV LAB;  Service: Cardiovascular;  Laterality: N/A;     Current Outpatient Medications  Medication Sig Dispense Refill  . acetaminophen (TYLENOL) 325 MG tablet Take  2 tablets (650 mg total) by mouth every 4 (four) hours as needed for headache or mild pain. (Patient taking differently: Take 650 mg by mouth every 6 (six) hours as needed for mild pain or headache. )    . amiodarone (PACERONE) 200 MG tablet Take 1 tablet (200 mg total) by mouth daily. 30 tablet 0  . benzonatate (TESSALON) 100 MG capsule Take 1 capsule (100 mg total) by mouth every 8 (eight) hours. 30 capsule 0  . KLOR-CON M20 20 MEQ tablet TAKE 2 TABLETS BY MOUTH DAILY 180 tablet 1  . lactobacillus (FLORANEX/LACTINEX) PACK Take 1 packet (1 g total) by mouth 3 (three) times daily with meals. 90 packet 0  . milrinone (PRIMACOR) 20 MG/100 ML SOLN infusion Inject 0.0205 mg/min into the vein continuous. 100 mL 0  . mometasone-formoterol (DULERA) 200-5 MCG/ACT AERO Inhale 2 puffs into the lungs 2 (two) times daily. 8.8 g 0  . OXYGEN Inhale 2 L into the lungs continuous.    . pantoprazole (PROTONIX) 40 MG tablet Take 1 tablet (40 mg total) by mouth daily. 30 tablet 5  . polyethylene glycol (MIRALAX / GLYCOLAX) packet Take 17 g by mouth daily. 30 each 0  . spironolactone (ALDACTONE) 25 MG tablet TAKE 0.5 TABLETS (12.5 MG TOTAL) BY MOUTH AT BEDTIME. 45 tablet 1  . sucralfate (CARAFATE) 1 g tablet Take 1 tablet (1 g total) by mouth 4 (four) times daily -  with meals and at bedtime. 120 tablet 0  . tamsulosin (FLOMAX) 0.4 MG CAPS capsule Take 2 capsules (0.8 mg total) by mouth daily after supper. 60 capsule 0  . torsemide (DEMADEX) 20 MG tablet Take 2 tablets (40 mg total) by mouth daily. 360 tablet 1   No current facility-administered medications for this encounter.     Allergies:   Patient has no known allergies.   Social History:  The patient  reports that he quit smoking about 42 years ago. His smoking use included cigarettes. He started smoking about 60 years ago. He has a 12.75 pack-year smoking history. He has never used smokeless tobacco. He reports that he does not drink alcohol or use drugs.    Family History:  The patient's family history includes Heart attack (age of onset: 63) in his father; Heart disease in his father; Hypertension in his father and mother.   ROS:  Please see the history of present illness.   All other systems are personally reviewed and negative.   Exam:  (Video/Tele Health Call; Exam is subjective and or/visual.) General:  Speaks in full sentences. No resp difficulty. Lungs: Normal respiratory effort with conversation.  Abdomen: Non-distended per patient report Extremities: Pt endorses mild edema. Neuro: Alert & oriented x 3.   Recent Labs: 01/06/2019: TSH 0.906 02/06/2019: B Natriuretic Peptide 2,172.9; Magnesium 2.2 02/11/2019: Hemoglobin 11.6; Platelets 126 02/14/2019: ALT 14; BUN 25; Creatinine, Ser 1.25; Potassium 3.9; Sodium 135  Personally reviewed   Wt Readings from Last 3 Encounters:  02/14/19 81.2 kg (179 lb)  02/14/19 81.2 kg (179 lb)  02/10/19 78.6 kg (173 lb 3.2 oz)  ASSESSMENT AND PLAN:  1. Chronic Systolic Heart Failure ->recent cardiogenic shock: Due to primarily to nonischemic cardiomyopathy (out of proportion to CAD). [place] 2015 with moderate disease. Has Biotronik BiV ICD. Initial CO-OX 38% so milrinone started on 1/28.  -ECHO 01/01/19 EF 10% with severe MR/TR, biatrial enlargement, RV with moderately reduced function.  - Improved with inotropic support NYHA III  - Continue milrinone 0.25 mcg/kg/min. Managed through [facility]. Labs every Friday with [facility].  - Mildly volume overloaded after decreasing torsemide to 40 daily. Continue torsemide 40/20 mg daily and metolazone 2.5 mg once a week (Saturday) with extra 40 meq of K. 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 He continues to say he is feeling better but not ready for VAD yet.  - Continue HHPT  2. RP bleed with symptomatic anemia - Spontaneous  while on heparin (? Traumatic component). Was on heparin for h/o LV clot (no longer present) - Off AC currently. Follow labs  3. CKD Stage III, Previously creatinine baseline 1.8-2.4.  - Continue labs with [facility]. Recent creatinine 2.1   COVID screen The patient does not have any symptoms that suggest any further testing/ screening at this time.  Social distancing reinforced today.  Recommended follow-up:  As above  Relevant cardiac medications were reviewed at length with the patient today.   The patient does not have concerns regarding their medications at this time.   The following changes were made today:  As above  Today, I have spent 16 minutes with the patient with telehealth technology discussing the above issues .    Signed, Spencer Bickers, MD  05/02/2019 3:25 PM  [place] [address] Heart and [place] [address] [phone] (office) [phone] (fax)

## 2019-05-08 ENCOUNTER — Other Ambulatory Visit (HOSPITAL_COMMUNITY): Payer: Self-pay | Admitting: Internal Medicine

## 2019-05-15 ENCOUNTER — Ambulatory Visit (HOSPITAL_COMMUNITY)
Admission: RE | Admit: 2019-05-15 | Discharge: 2019-05-15 | Disposition: A | Discharge status: Home / Self Care | Payer: Medicare Other | Source: Ambulatory Visit | Attending: Internal Medicine | Admitting: Internal Medicine

## 2019-05-15 ENCOUNTER — Other Ambulatory Visit: Payer: Self-pay

## 2019-05-15 DIAGNOSIS — I5022 Chronic systolic (congestive) heart failure: Secondary | ICD-10-CM

## 2019-05-15 DIAGNOSIS — N183 Chronic kidney disease, stage 3 unspecified: Secondary | ICD-10-CM

## 2019-05-15 NOTE — Progress Notes (Signed)
AVS mailed to pt, message sent to schedulers to schedule

## 2019-05-15 NOTE — Progress Notes (Signed)
Heart Failure TeleHealth Note  Due to national recommendations of social distancing due to [name], Audio/video telehealth visit is felt to be most appropriate for this patient at this time.  See MyChart message from today for patient consent regarding telehealth for [name]  Date:  05/15/2019   ID:  Spencer Rice, DOB [birth year], MRN [id number]  Location: Home  Provider location: [place] [facility] Type of Visit: Established patient  PCP:  [name]., MD  Cardiologist:  No primary care provider on file. Primary HF: Bensimhon  Chief Complaint: HF follow-up   History of Present Illness:  Spencer Rice is a 78 y.o. 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    . Admitted 1/26-2/26/2020 with cardiogenic shock.PICC placed 1/27 with initial CVP 26 coox 38% -> Milrinone started with improved diuresis and renal function. Echo 01/01/19 EF 10% with severe MR/TR, biatrial enlargement, RV with moderately reduced function. Taken for [facility] 01/07/19 with Mild, non-obstructive CAD, Moderate PAH with normal PAPI, and normal CO on milrinone 0.25 mcg/kg/min. PFTs 01/10/2001 FVC 2.28 (54%), FEV 2.0 (64%), DLCO 14.64 (53%). Discussed for LVADconsideration at [facility], and had planned to move forward,but patient had spontaneous RP bleed with symptomatic anemia complicating his admission.Discharged to SNF for rehab,   He presents via audio/video conferencing for a telehealth visit today. Remains on milrinone 0.25. Has HHRN and HHPT come once a week. We had a telehealth visit several weeks ago and we changed him back to torsemide 40/20 with metolazone 2.5 weekly. Says weight is back down. Weight back down to 168-170. Denies SOB, orthopnea, PND, edema. HHRN  coming every Friday. PCP treated for Klebsiella UTI. Denies fevers or chills or dysuria.   Labs 05/09/19 Creatinine 2.03  Labs 04/18/19 Creatinine 2.25 K 4.0 Labs 04/11/19 Creatinine 2.17  Labs 03/14/19: Creatinine 1.79   He denies symptoms worrisome for COVID 19.   Past Medical History:  Diagnosis Date  . AICD (automatic cardioverter/defibrillator) present   . Cancer of left kidney ([name])    S/P OR 05/2014  . CHF (congestive heart failure) ([name])   . CKD (chronic kidney disease)   . 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 Coronary artery disease   . DVT (deep venous thrombosis) (HCC) 1983   LLE  . GERD (gastroesophageal reflux disease)   . History of hiatal hernia   . Hypertension    Past Surgical History:  Procedure Laterality Date  . CARDIAC CATHETERIZATION  1980's  . IMPLANTABLE CARDIOVERTER DEFIBRILLATOR IMPLANT  07/21/2010  . IR FLUORO GUIDE CV LINE RIGHT  01/23/2019  . IR GENERIC HISTORICAL  12/23/2014   IR RADIOLOGIST EVAL & MGMT 12/23/2014 [name], MD [facility] INTERV RAD  . IR GENERIC HISTORICAL  07/20/2016   IR RADIOLOGIST EVAL & MGMT 07/20/2016 [facility] INTERV RAD  . IR RADIOLOGIST EVAL & MGMT  07/04/2017  . IR US GUIDE VASC ACCESS RIGHT  01/23/2019  . RENAL CRYOABLATION Left 05/2014  . RIGHT/LEFT HEART CATH AND CORONARY ANGIOGRAPHY N/A 01/07/2019   Procedure: RIGHT/LEFT HEART CATH AND CORONARY ANGIOGRAPHY;  Surgeon: Jolaine Artist, MD;  Location: [place] [facility];  Service: Cardiovascular;  Laterality: N/A;     Current Outpatient Medications  Medication Sig Dispense Refill  .  acetaminophen (TYLENOL) 325 MG tablet Take 2 tablets (650 mg total) by mouth every 4 (four) hours as needed for headache or mild pain. (Patient taking differently: Take 650 mg by mouth every 6 (six) hours as needed for mild pain or headache. )    . amiodarone (PACERONE) 200 MG tablet Take 1 tablet (200 mg total) by mouth daily. 30 tablet 0  . benzonatate (TESSALON)  100 MG capsule Take 1 capsule (100 mg total) by mouth every 8 (eight) hours. 30 capsule 0  . KLOR-CON M20 20 MEQ tablet TAKE 2 TABLETS BY MOUTH DAILY 180 tablet 1  . lactobacillus (FLORANEX/LACTINEX) PACK Take 1 packet (1 g total) by mouth 3 (three) times daily with meals. 90 packet 0  . milrinone (PRIMACOR) 20 MG/100 ML SOLN infusion Inject 0.0205 mg/min into the vein continuous. 100 mL 0  . mometasone-formoterol (DULERA) 200-5 MCG/ACT AERO Inhale 2 puffs into the lungs 2 (two) times daily. 8.8 g 0  . OXYGEN Inhale 2 L into the lungs continuous.    . pantoprazole (PROTONIX) 40 MG tablet Take 1 tablet (40 mg total) by mouth daily. 30 tablet 5  . polyethylene glycol (MIRALAX / GLYCOLAX) packet Take 17 g by mouth daily. 30 each 0  . spironolactone (ALDACTONE) 25 MG tablet TAKE 0.5 TABLETS (12.5 MG TOTAL) BY MOUTH AT BEDTIME. 45 tablet 1  . sucralfate (CARAFATE) 1 g tablet Take 1 tablet (1 g total) by mouth 4 (four) times daily -  with meals and at bedtime. 120 tablet 0  . tamsulosin (FLOMAX) 0.4 MG CAPS capsule Take 2 capsules (0.8 mg total) by mouth daily after supper. 60 capsule 0  . torsemide (DEMADEX) 20 MG tablet Take 2 tablets (40 mg total) by mouth daily. 360 tablet 1   No current facility-administered medications for this encounter.     Allergies:   Patient has no known allergies.   Social History:  The patient  reports that he quit smoking about 42 years ago. His smoking use included cigarettes. He started smoking about 60 years ago. He has a 12.75 pack-year smoking history. He has never used smokeless tobacco. He reports that he does not drink alcohol or use drugs.   Family History:  The patient's family history includes Heart attack (age of onset: 75) in his father; Heart disease in his father; Hypertension in his father and mother.   ROS:  Please see the history of present illness.   All other systems are personally reviewed and negative.   Exam:  (Video/Tele Health Call; Exam is  subjective and or/visual.) General:  Speaks in full sentences. No resp difficulty. Lungs: Normal respiratory effort with conversation.  Abdomen: Non-distended per patient report Extremities: Pt endorses mild edema. Neuro: Alert & oriented x 3.   Recent Labs: 01/06/2019: TSH 0.906 02/06/2019: B Natriuretic Peptide 2,172.9; Magnesium 2.2 02/11/2019: Hemoglobin 11.6; Platelets 126 02/14/2019: ALT 14; BUN 25; Creatinine, Ser 1.25; Potassium 3.9; Sodium 135  Personally reviewed   Wt Readings from Last 3 Encounters:  02/14/19 81.2 kg (179 lb)  02/14/19 81.2 kg (179 lb)  02/10/19 78.6 kg (173 lb 3.2 oz)      ASSESSMENT AND PLAN:  1. Chronic Systolic Heart Failure ->recent cardiogenic shock: Due to primarily to nonischemic cardiomyopathy (out of proportion to CAD). [place] 2015 with moderate disease. Has Biotronik BiV ICD. Initial CO-OX 38% so milrinone started on 1/28.  -ECHO 01/01/19 EF 10% with severe MR/TR, biatrial enlargement, RV with moderately reduced function.  - Improved with inotropic  support NYHA III  - Continue milrinone 0.25 mcg/kg/min. Managed through [facility]. Labs every Friday with [facility].  - Volume status much improved. Continue torsemide 40/20 mg daily and metolazone 2.5 mg once a week (Saturday) with extra 40 meq of K. 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 However he continues to say he is feeling better but not ready for VAD yet.   - Continue HHPT  2. H/o RP bleed with symptomatic anemia - Was on [facility] for h/o LV clot (no longer present) - Off AC currently. Follow labs   3. CKD Stage III, Previously creatinine baseline 1.8-2.4.  - Continue labs with [facility]. Recent creatinine 2.0  4. Recurrent Klebsiella UTI - treatment per PCP  COVID screen The patient does not have any symptoms that suggest any further testing/ screening  at this time.  Social distancing reinforced today.  Recommended follow-up:  4 weeks   Relevant cardiac medications were reviewed at length with the patient today.   The patient does not have concerns regarding their medications at this time.   The following changes were made today:  As above  Today, I have spent 17 minutes with the patient with telehealth technology discussing the above issues .    Signed, Glori Bickers, MD  05/15/2019 1:58 PM  [place] [address] [place] [address] [phone] (office) [phone] (fax)

## 2019-05-15 NOTE — Addendum Note (Signed)
Encounter addended by: Scarlette Calico, RN on: 05/15/2019 2:40 PM  Actions taken: Clinical Note Signed

## 2019-05-15 NOTE — Patient Instructions (Signed)
Your physician recommends that you schedule a follow-up appointment in: 4 weeks with telephone visit, our office will call you to schedule this  If you have any questions or concerns before your next appointment please send [place] a message through [place] or call our office at [phone].

## 2019-05-16 ENCOUNTER — Telehealth (HOSPITAL_COMMUNITY): Payer: Self-pay

## 2019-05-16 NOTE — Telephone Encounter (Signed)
Pt called and stated that his line is "clamped off" for his milrinone. This rn told him that I would call [facility] to make sure that someone is able to come out to fix the problem. Called [facility] to notify them of the issue. They are aware. They state that the tubing/ line is working fine.

## 2019-05-20 ENCOUNTER — Other Ambulatory Visit (HOSPITAL_COMMUNITY): Payer: Self-pay | Admitting: Internal Medicine

## 2019-05-23 ENCOUNTER — Other Ambulatory Visit (HOSPITAL_COMMUNITY): Payer: Self-pay | Admitting: Internal Medicine

## 2019-05-27 ENCOUNTER — Other Ambulatory Visit (HOSPITAL_COMMUNITY): Payer: Self-pay | Admitting: Internal Medicine

## 2019-06-10 ENCOUNTER — Other Ambulatory Visit (HOSPITAL_COMMUNITY): Payer: Self-pay | Admitting: Internal Medicine

## 2019-06-11 ENCOUNTER — Telehealth (HOSPITAL_COMMUNITY): Payer: Self-pay

## 2019-06-11 NOTE — Telephone Encounter (Signed)
Received call from [place] with the results of a redraw BMET as the last BMET showed a critical value of Potassium of 8.9. Redraw of labs: K 3.3, Cr.:2.32, Ca: 8.2, BUN 65. AMY NP-C reviewed labs and is aware. She also wanted [place] to know that the patient's wife recently passed last week.   Next lab draw is 06/13/19.

## 2019-06-13 ENCOUNTER — Other Ambulatory Visit (HOSPITAL_COMMUNITY): Payer: Self-pay | Admitting: Internal Medicine

## 2019-06-16 ENCOUNTER — Other Ambulatory Visit: Payer: Self-pay

## 2019-06-16 ENCOUNTER — Ambulatory Visit (HOSPITAL_COMMUNITY)
Admission: RE | Admit: 2019-06-16 | Discharge: 2019-06-16 | Disposition: A | Discharge status: Home / Self Care | Payer: Medicare Other | Source: Ambulatory Visit | Attending: Internal Medicine | Admitting: Internal Medicine

## 2019-06-16 DIAGNOSIS — N183 Chronic kidney disease, stage 3 unspecified: Secondary | ICD-10-CM

## 2019-06-16 DIAGNOSIS — I5022 Chronic systolic (congestive) heart failure: Secondary | ICD-10-CM | POA: Diagnosis not present

## 2019-06-16 MED ORDER — METOLAZONE 2.5 MG PO TABS: 2.5000 mg | ORAL_TABLET | ORAL | 3 refills | Status: DC

## 2019-06-16 NOTE — Progress Notes (Signed)
Heart Failure TeleHealth Note  Due to national recommendations of social distancing due to [place] 19, Audio/video telehealth visit is felt to be most appropriate for this patient at this time.  See MyChart message from today for patient consent regarding telehealth for [facility].  Date:  06/16/2019   ID:  Spencer Rice, DOB 23-Feb-1941, MRN [id number]  Location: Home  Provider location: [place] [facility] Type of Visit: Established patient  PCP:  [name]., MD  Cardiologist:  No primary care provider on file. Primary HF: Katharin Schneider  Chief Complaint: HF follow-up   History of Present Illness:  Spencer Rice is a 78 y.o. 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    . Admitted 1/26-2/26/2020 with cardiogenic shock.PICC placed 1/27 with initial CVP 26 coox 38% -> Milrinone started with improved diuresis and renal function. Echo 01/01/19 EF 10% with severe MR/TR, biatrial enlargement, RV with moderately reduced function. Taken for [facility] 01/07/19 with Mild, non-obstructive CAD, Moderate PAH with normal PAPI, and normal CO on milrinone 0.25 mcg/kg/min. PFTs 01/10/2001 FVC 2.28 (54%), FEV 2.0 (64%), DLCO 14.64 (53%). Discussed for LVADconsideration at [facility], and had planned to move forward,but patient had spontaneous RP bleed with symptomatic anemia complicating his admission.Discharged to SNF for rehab,   He presents via audio/video conferencing for a telehealth visit today. Remains on milrinone 0.25. Has HHRN come once a week on Friday. Now walking around the house and doing all ADLs without problem. Now able to go out and get the mail. Now on torsemide 40 bid (we had cut him back to 40/20 and legs started swelling up again so he went back up)  with metolazone 2.5 weekly. Says weight is back down. Weight back down to 169-173. Denies orthopnea or PND. No fevers, chills. Appetite great. Taking all meds as prescribed. Wife died last week due to large CVA.  Labs 7/7 k 3.3 cr 2.3 (had redraw 7/10 - not resulted yet)   Labs 05/09/19 Creatinine 2.03  Labs 04/18/19 Creatinine 2.25 K 4.0 Labs 04/11/19 Creatinine 2.17  Labs 03/14/19: Creatinine 1.79   He denies symptoms worrisome for COVID 19.   Past Medical History:  Diagnosis Date  . AICD (automatic cardioverter/defibrillator) present   . Cancer of left kidney ([place])    S/P OR 05/2014  . CHF (congestive heart failure) ([place])   . CKD (chronic kidney disease)   . 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 Coronary artery disease   . DVT (deep venous thrombosis) (HCC) 1983   LLE  . GERD (gastroesophageal reflux disease)   . History of hiatal hernia   . Hypertension    Past Surgical History:  Procedure Laterality Date  . CARDIAC CATHETERIZATION  1980's  . IMPLANTABLE CARDIOVERTER DEFIBRILLATOR IMPLANT  07/21/2010  . IR FLUORO GUIDE CV LINE RIGHT  01/23/2019  . IR GENERIC HISTORICAL  12/23/2014   IR RADIOLOGIST EVAL & MGMT 12/23/2014 [name], MD [facility] INTERV RAD  . IR GENERIC HISTORICAL  07/20/2016   IR RADIOLOGIST EVAL & MGMT 07/20/2016 [facility] INTERV RAD  . IR RADIOLOGIST EVAL & MGMT  07/04/2017  . IR US GUIDE VASC ACCESS RIGHT  01/23/2019  . RENAL CRYOABLATION Left 05/2014  . RIGHT/LEFT HEART CATH AND CORONARY ANGIOGRAPHY  N/A 01/07/2019   Procedure: RIGHT/LEFT HEART CATH AND CORONARY ANGIOGRAPHY;  Surgeon: Jolaine Artist, MD;  Location: [place] CV LAB;  Service: Cardiovascular;  Laterality: N/A;     Current Outpatient Medications  Medication Sig Dispense Refill  . acetaminophen (TYLENOL) 325 MG tablet Take 2 tablets (650 mg total) by mouth every 4 (four) hours as needed for headache or mild pain. (Patient taking differently: Take 650 mg by  mouth every 6 (six) hours as needed for mild pain or headache. )    . amiodarone (PACERONE) 200 MG tablet Take 1 tablet (200 mg total) by mouth daily. 30 tablet 0  . benzonatate (TESSALON) 100 MG capsule Take 1 capsule (100 mg total) by mouth every 8 (eight) hours. 30 capsule 0  . KLOR-CON M20 20 MEQ tablet TAKE 2 TABLETS BY MOUTH DAILY 180 tablet 1  . lactobacillus (FLORANEX/LACTINEX) PACK Take 1 packet (1 g total) by mouth 3 (three) times daily with meals. 90 packet 0  . milrinone (PRIMACOR) 20 MG/100 ML SOLN infusion Inject 0.0205 mg/min into the vein continuous. 100 mL 0  . mometasone-formoterol (DULERA) 200-5 MCG/ACT AERO Inhale 2 puffs into the lungs 2 (two) times daily. 8.8 g 0  . OXYGEN Inhale 2 L into the lungs continuous.    . pantoprazole (PROTONIX) 40 MG tablet Take 1 tablet (40 mg total) by mouth daily. 30 tablet 5  . polyethylene glycol (MIRALAX / GLYCOLAX) packet Take 17 g by mouth daily. 30 each 0  . spironolactone (ALDACTONE) 25 MG tablet TAKE 0.5 TABLETS (12.5 MG TOTAL) BY MOUTH AT BEDTIME. 45 tablet 1  . sucralfate (CARAFATE) 1 g tablet Take 1 tablet (1 g total) by mouth 4 (four) times daily -  with meals and at bedtime. 120 tablet 0  . tamsulosin (FLOMAX) 0.4 MG CAPS capsule Take 2 capsules (0.8 mg total) by mouth daily after supper. 60 capsule 0  . torsemide (DEMADEX) 20 MG tablet Take 2 tablets (40 mg total) by mouth daily. 360 tablet 1   No current facility-administered medications for this encounter.     Allergies:   Patient has no known allergies.   Social History:  The patient  reports that he quit smoking about 43 years ago. His smoking use included cigarettes. He started smoking about 60 years ago. He has a 12.75 pack-year smoking history. He has never used smokeless tobacco. He reports that he does not drink alcohol or use drugs.   Family History:  The patient's family history includes Heart attack (age of onset: 59) in his father; Heart disease in his father;  Hypertension in his father and mother.   ROS:  Please see the history of present illness.   All other systems are personally reviewed and negative.   Exam:  (Video/Tele Health Call; Exam is subjective and or/visual.) General:  Speaks in full sentences. No resp difficulty. Lungs: Normal respiratory effort with conversation.  Abdomen: Non-distended per patient report Extremities: Pt endorses mild edema. Neuro: Alert & oriented x 3.   Recent Labs: 01/06/2019: TSH 0.906 02/06/2019: B Natriuretic Peptide 2,172.9; Magnesium 2.2 02/11/2019: Hemoglobin 11.6; Platelets 126 02/14/2019: ALT 14; BUN 25; Creatinine, Ser 1.25; Potassium 3.9; Sodium 135  Personally reviewed   Wt Readings from Last 3 Encounters:  02/14/19 81.2 kg (179 lb)  02/14/19 81.2 kg (179 lb)  02/10/19 78.6 kg (173 lb 3.2 oz)      ASSESSMENT AND PLAN:  1. Chronic Systolic Heart Failure ->recent cardiogenic shock: Due to primarily to nonischemic  cardiomyopathy (out of proportion to CAD). [place] 2015 with moderate disease. Has Biotronik BiV ICD. Initial CO-OX 38% so milrinone started on 1/28.  -ECHO 01/01/19 EF 10% with severe MR/TR, biatrial enlargement, RV with moderately reduced function.  - Improved with inotropic support NYHA III  - Continue milrinone 0.25 mcg/kg/min. Managed through [facility]. Labs every Friday with [facility].  - Volume status improved. Continue torsemide 40/40 mg daily and metolazone 2.5 mg once a week (Saturday) with extra 40 meq of K. Goal weight 169-172 - Recent labs with K 3.3 and creatinine 2.3. Redraw performed. Await results. I asked him to take extra 20 kcl today. 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 However he continues to say he is feeling better but not ready for VAD yet.   - Continue HHPT  2. H/o RP bleed with symptomatic anemia - Was on [facility] for h/o LV clot (no longer present) - Off  AC currently. Follow labs   3. CKD Stage III, Previously creatinine baseline 1.8-2.4.  - Continue labs with [facility]. Recent creatinine 2.3. await repeat results   4. Recurrent Klebsiella UTI - treatment per PCP  COVID screen The patient does not have any symptoms that suggest any further testing/ screening at this time.  Social distancing reinforced today.  Recommended follow-up:  3weeks   Relevant cardiac medications were reviewed at length with the patient today.   The patient does not have concerns regarding their medications at this time.   The following changes were made today:  As above  Today, I have spent 17 minutes with the patient with telehealth technology discussing the above issues .    Signed, Glori Bickers, MD  06/16/2019 3:10 PM  [place] [address] Heart [place] [address] [phone] (office) [phone] (fax)

## 2019-06-16 NOTE — Progress Notes (Signed)
-   Please renew metolazone 2.5 mg weekly. Every Friday.  - I asked him to take extra 20 kcl today.  - Televist 3 weeks    Follow up sent to scheduler.

## 2019-06-16 NOTE — Addendum Note (Signed)
Encounter addended by: Marlise Eves, RN on: 06/16/2019 3:50 PM  Actions taken: Order list changed, Clinical Note Signed

## 2019-06-16 NOTE — Patient Instructions (Addendum)
REFILLED Metolazone 2.5mg  (1 tab) every Friday.  Please take 1 tab (27meq) of Potassium today.  Please follow up with the [facility] in 3 weeks with a telehealth visit.   At the [facility], you and your health needs are our priority. As part of our continuing mission to provide you with exceptional heart care, we have created designated Provider Care Teams. These Care Teams include your primary Cardiologist (physician) and Advanced Practice Providers (APPs- Physician Assistants and Nurse Practitioners) who all work together to provide you with the care you need, when you need it.   You may see any of the following providers on your designated Care Team at your next follow up: Marland Kitchen Dr Glori Bickers . Dr [name] . Darrick Grinder, NP   f

## 2019-06-20 ENCOUNTER — Telehealth (HOSPITAL_COMMUNITY): Payer: Self-pay

## 2019-06-20 NOTE — Telephone Encounter (Signed)
Plan of care signed, to be picked up by Linna Hoff of [facility]

## 2019-06-23 ENCOUNTER — Telehealth (HOSPITAL_COMMUNITY): Payer: Self-pay | Admitting: *Deleted

## 2019-06-23 NOTE — Telephone Encounter (Signed)
Attempted to reach pt multiple times to discuss lab results. Chantel left VM in 06/19/2019 at 12:55pm. I called patient 06/20/2019 at 2:24pm no answer/left VM  and today 06/23/2019 at 9:17am no answer/left VM.  Per Dr.Bensimhon change torsemide to 40mg  twice daily alternating with 40mg  daily. Repeat bmet in 1 week.

## 2019-06-25 NOTE — Telephone Encounter (Signed)
Called pt again no answer. Mailed letter.

## 2019-07-02 ENCOUNTER — Other Ambulatory Visit (HOSPITAL_COMMUNITY): Payer: Self-pay | Admitting: Internal Medicine

## 2019-07-04 ENCOUNTER — Other Ambulatory Visit (HOSPITAL_COMMUNITY): Payer: Self-pay | Admitting: Interventional Radiology

## 2019-07-04 DIAGNOSIS — C642 Malignant neoplasm of left kidney, except renal pelvis: Secondary | ICD-10-CM

## 2019-07-07 ENCOUNTER — Other Ambulatory Visit: Payer: Self-pay

## 2019-07-07 ENCOUNTER — Ambulatory Visit (HOSPITAL_COMMUNITY)
Admission: RE | Admit: 2019-07-07 | Discharge: 2019-07-07 | Disposition: A | Discharge status: Home / Self Care | Payer: Medicare Other | Source: Ambulatory Visit | Attending: Internal Medicine | Admitting: Internal Medicine

## 2019-07-07 DIAGNOSIS — N183 Chronic kidney disease, stage 3 unspecified: Secondary | ICD-10-CM

## 2019-07-07 DIAGNOSIS — I5022 Chronic systolic (congestive) heart failure: Secondary | ICD-10-CM | POA: Diagnosis not present

## 2019-07-07 NOTE — Progress Notes (Signed)
Heart Failure TeleHealth Note  Due to national recommendations of social distancing due to [place] 19, Audio/video telehealth visit is felt to be most appropriate for this patient at this time.  See MyChart message from today for patient consent regarding telehealth for [facility].  Date:  07/07/2019   ID:  Spencer Rice, DOB 1941-01-03, MRN [id number]  Location: Home  Provider location: [place] [facility] Type of Visit: Established patient  PCP:  Spencer Rice., MD  Cardiologist:  No primary care provider on file. Primary HF: Spencer Rice  Chief Complaint: HF follow-up   History of Present Illness:  Spencer Rice is a 78 y.o. 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    . Admitted 1/26-2/26/2020 with cardiogenic shock.PICC placed 1/27 with initial CVP 26 coox 38% -> Milrinone started with improved diuresis and renal function. Echo 01/01/19 EF 10% with severe MR/TR, biatrial enlargement, RV with moderately reduced function. Taken for [facility] 01/07/19 with Mild, non-obstructive CAD, Moderate PAH with normal PAPI, and normal CO on milrinone 0.25 mcg/kg/min. PFTs 01/10/2001 FVC 2.28 (54%), FEV 2.0 (64%), DLCO 14.64 (53%). Discussed for LVADconsideration at [facility], and had planned to move forward,but patient had spontaneous RP bleed with symptomatic anemia complicating his admission.Discharged to SNF for rehab,   He presents via audio/video conferencing for a telehealth visit today. Remains on milrinone 0.25. Has HHRN come once a week on Friday. At last visit creatinine up to 2.3. Torsemide cut back to 40 bid alternating with 40 daily but he misunderstood and now he is back yo taking 40/20. (we had cut him back to 40/20 and legs started swelling up again so  he went back up to 40 bid). Says he feels good. Able to do ADLs without problem. Going to get mail. Weight stable around 172. No edema orthopnea or PND. Great appetite. Wife died recentlydue to large CVA.   He denies symptoms worrisome for COVID 19.   Past Medical History:  Diagnosis Date  . AICD (automatic cardioverter/defibrillator) present   . Cancer of left kidney ([place])    S/P OR 05/2014  . CHF (congestive heart failure) ([place])   . CKD (chronic kidney disease)   . 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 Coronary artery disease   . DVT (deep venous thrombosis) ([facility]) 1983   LLE  . GERD (gastroesophageal reflux disease)   . History of hiatal hernia   . Hypertension    Past Surgical History:  Procedure Laterality Date  . CARDIAC CATHETERIZATION  1980's  . IMPLANTABLE CARDIOVERTER DEFIBRILLATOR IMPLANT  07/21/2010  . IR FLUORO GUIDE CV LINE RIGHT  01/23/2019  . IR GENERIC HISTORICAL  12/23/2014   IR RADIOLOGIST EVAL & MGMT 12/23/2014 Spencer Edouard, MD [facility] INTERV RAD  . IR GENERIC HISTORICAL  07/20/2016   IR RADIOLOGIST EVAL & MGMT 07/20/2016 [facility] INTERV RAD  . IR RADIOLOGIST EVAL & MGMT  07/04/2017  . IR US GUIDE VASC ACCESS RIGHT  01/23/2019  . RENAL CRYOABLATION Left 05/2014  . RIGHT/LEFT HEART CATH AND CORONARY ANGIOGRAPHY N/A 01/07/2019   Procedure: RIGHT/LEFT HEART CATH AND CORONARY ANGIOGRAPHY;  Surgeon: Spencer Artist, MD;  Location: [place] [facility];  Service: Cardiovascular;  Laterality: N/A;     Current Outpatient  Medications  Medication Sig Dispense Refill  . acetaminophen (TYLENOL) 325 MG tablet Take 2 tablets (650 mg total) by mouth every 4 (four) hours as needed for headache or mild pain. (Patient taking differently: Take 650 mg by mouth every 6 (six) hours as needed for mild pain or headache. )    . amiodarone (PACERONE) 200 MG tablet Take 1 tablet (200 mg total) by mouth daily. 30 tablet 0  . benzonatate (TESSALON) 100 MG  capsule Take 1 capsule (100 mg total) by mouth every 8 (eight) hours. 30 capsule 0  . KLOR-CON M20 20 MEQ tablet TAKE 2 TABLETS BY MOUTH DAILY 180 tablet 1  . lactobacillus (FLORANEX/LACTINEX) PACK Take 1 packet (1 g total) by mouth 3 (three) times daily with meals. 90 packet 0  . metolazone (ZAROXOLYN) 2.5 MG tablet Take 1 tablet (2.5 mg total) by mouth once a week. Every Friday. 15 tablet 3  . milrinone (PRIMACOR) 20 MG/100 ML SOLN infusion Inject 0.0205 mg/min into the vein continuous. 100 mL 0  . mometasone-formoterol (DULERA) 200-5 MCG/ACT AERO Inhale 2 puffs into the lungs 2 (two) times daily. 8.8 g 0  . OXYGEN Inhale 2 L into the lungs continuous.    . pantoprazole (PROTONIX) 40 MG tablet Take 1 tablet (40 mg total) by mouth daily. 30 tablet 5  . polyethylene glycol (MIRALAX / GLYCOLAX) packet Take 17 g by mouth daily. 30 each 0  . spironolactone (ALDACTONE) 25 MG tablet TAKE 0.5 TABLETS (12.5 MG TOTAL) BY MOUTH AT BEDTIME. 45 tablet 1  . sucralfate (CARAFATE) 1 g tablet Take 1 tablet (1 g total) by mouth 4 (four) times daily -  with meals and at bedtime. 120 tablet 0  . tamsulosin (FLOMAX) 0.4 MG CAPS capsule Take 2 capsules (0.8 mg total) by mouth daily after supper. 60 capsule 0  . torsemide (DEMADEX) 20 MG tablet Take 2 tablets (40 mg total) by mouth daily. 360 tablet 1   No current facility-administered medications for this encounter.     Allergies:   Patient has no known allergies.   Social History:  The patient  reports that he quit smoking about 43 years ago. His smoking use included cigarettes. He started smoking about 60 years ago. He has a 12.75 pack-year smoking history. He has never used smokeless tobacco. He reports that he does not drink alcohol or use drugs.   Family History:  The patient's family history includes Heart attack (age of onset: 39) in his father; Heart disease in his father; Hypertension in his father and mother.   ROS:  Please see the history of present  illness.   All other systems are personally reviewed and negative.   Exam:  (Video/Tele Health Call; Exam is subjective and or/visual.) General:  Speaks in full sentences. No resp difficulty. Lungs: Normal respiratory effort with conversation.  Abdomen: Non-distended per patient report Extremities: Pt endorses mild edema. Neuro: Alert & oriented x 3.   Recent Labs: 01/06/2019: TSH 0.906 02/06/2019: B Natriuretic Peptide 2,172.9; Magnesium 2.2 02/11/2019: Hemoglobin 11.6; Platelets 126 02/14/2019: ALT 14; BUN 25; Creatinine, Ser 1.25; Potassium 3.9; Sodium 135  Personally reviewed   Wt Readings from Last 3 Encounters:  02/14/19 81.2 kg (179 lb)  02/14/19 81.2 kg (179 lb)  02/10/19 78.6 kg (173 lb 3.2 oz)      ASSESSMENT AND PLAN:  1. Chronic Systolic Heart Failure ->recent cardiogenic shock: Due to primarily to nonischemic cardiomyopathy (out of proportion to CAD). [place] 2015 with moderate disease. Has  Biotronik BiV ICD. Initial CO-OX 38% so milrinone started on 1/28.  -ECHO 01/01/19 EF 10% with severe MR/TR, biatrial enlargement, RV with moderately reduced function.  - Improved with inotropic support NYHA III  - Continue milrinone 0.25 mcg/kg/min. Managed through [facility]. Labs every Friday with [facility].  - Volume status improved but creatinine now bumped up to 2.3 and has been fairly stable there. Will continue torsemide 40/20 and recheck labs on Friday. If creatinine going back up can switch to 40 bid alternating with 40 daily - Continue spiro 12.5 mg daily - No BB with inotrope dependence - Discussed at VAD MRB meeting in 2/20  and would be possible VAD candidate if he gets stronger. However he continues to say he is feeling better but not ready for VAD yet.    2. H/o RP bleed with symptomatic anemia - Was on [facility] for h/o LV clot (no longer present) - Off AC currently. HGb stable  3. CKD Stage III, Previously creatinine baseline 1.8-2.4.  - Continue labs with [facility]. Recent creatinine 2.3.  await f/u on Friday  4. Recurrent Klebsiella UTI - treatment per PCP  COVID screen The patient does not have any symptoms that suggest any further testing/ screening at this time.  Social distancing reinforced today.  Recommended follow-up:  3weeks   Relevant cardiac medications were reviewed at length with the patient today.   The patient does not have concerns regarding their medications at this time.   The following changes were made today:  As above  Today, I have spent 15 minutes with the patient with telehealth technology discussing the above issues .    Signed, Glori Bickers, MD  07/07/2019 3:54 PM  [place] [address] [facility] [place] [address] [phone] (office) [phone] (fax)

## 2019-07-08 ENCOUNTER — Encounter: Payer: Self-pay | Admitting: *Deleted

## 2019-07-08 ENCOUNTER — Other Ambulatory Visit: Payer: Self-pay

## 2019-07-08 ENCOUNTER — Ambulatory Visit
Admission: RE | Admit: 2019-07-08 | Discharge: 2019-07-08 | Disposition: A | Discharge status: Home / Self Care | Payer: Medicare Other | Source: Ambulatory Visit | Attending: Interventional Radiology | Admitting: Interventional Radiology

## 2019-07-08 DIAGNOSIS — C642 Malignant neoplasm of left kidney, except renal pelvis: Secondary | ICD-10-CM

## 2019-07-08 HISTORY — PX: IR RADIOLOGIST EVAL & MGMT: IMG5224

## 2019-07-08 NOTE — Progress Notes (Signed)
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  History of Present Illness: Spencer Rice is a 78 y.o. 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  He did sustain a large spontaneous left retroperitoneal hemorrhage in February of this year requiring extended hospitalization.  His heart failure was exacerbated at that time as was renal insufficiency and chronic kidney disease.  Unenhanced CT on 02/11/2027 showed decrease in size of the retroperitoneal hemorrhage and stable unenhanced appearance of the ablation defect.  Spencer Rice has some chronic fatigue.  He denies any pain.  He is not having any urinary symptoms.  Past Medical History:  Diagnosis Date  . AICD (automatic cardioverter/defibrillator) present   . Cancer of left kidney ([place])    S/P OR 05/2014  . CHF (congestive heart failure) ([place])   . CKD (chronic kidney disease)   . 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 Coronary artery disease   . DVT (deep venous thrombosis) ([facility]) 1983   LLE  . GERD (gastroesophageal reflux disease)   . History of hiatal hernia   . Hypertension     Past Surgical History:  Procedure Laterality Date  . CARDIAC CATHETERIZATION  1980's  . IMPLANTABLE CARDIOVERTER DEFIBRILLATOR IMPLANT  07/21/2010  . IR FLUORO GUIDE CV LINE RIGHT  01/23/2019  . IR GENERIC HISTORICAL  12/23/2014   IR RADIOLOGIST EVAL & MGMT 12/23/2014 [name], MD [facility] INTERV RAD  . IR GENERIC HISTORICAL  07/20/2016   IR RADIOLOGIST EVAL & MGMT 07/20/2016 [facility] INTERV RAD  . IR RADIOLOGIST EVAL & MGMT  07/04/2017  . IR RADIOLOGIST EVAL & MGMT  07/08/2019  . IR US GUIDE VASC ACCESS RIGHT  01/23/2019  . RENAL CRYOABLATION Left 05/2014  . RIGHT/LEFT HEART CATH AND CORONARY ANGIOGRAPHY N/A 01/07/2019   Procedure: RIGHT/LEFT HEART CATH AND CORONARY ANGIOGRAPHY;   Surgeon: Jolaine Artist, MD;  Location: [place] [facility];  Service: Cardiovascular;  Laterality: N/A;    Allergies: Patient has no known allergies.  Medications: Prior to Admission medications   Medication Sig Start Date End Date Taking? Authorizing Provider  acetaminophen (TYLENOL) 325 MG tablet Take 2 tablets (650 mg total) by mouth every 4 (four) hours as needed for headache or mild pain. Patient taking differently: Take 650 mg by mouth every 6 (six) hours as needed for mild pain or headache.  01/29/19   Shirley Friar, PA-C  amiodarone (PACERONE) 200 MG tablet Take 1 tablet (200 mg total) by mouth daily. 02/14/19   Hennie Duos, MD  benzonatate (TESSALON) 100 MG capsule Take 1 capsule (100 mg total) by mouth every 8 (eight) hours. 02/14/19   Hennie Duos, MD  KLOR-CON M20 20 MEQ tablet TAKE 2 TABLETS BY MOUTH DAILY 03/21/19   Bensimhon, Shaune Pascal, MD  lactobacillus (FLORANEX/LACTINEX) PACK Take 1 packet (1 g total) by mouth 3 (three) times daily with meals. 02/14/19   Hennie Duos, MD  metolazone (ZAROXOLYN) 2.5 MG tablet Take 1 tablet (2.5 mg total) by mouth once a week. Every Friday. 06/16/19 09/14/19  Bensimhon, Shaune Pascal, MD  milrinone (PRIMACOR) 20 MG/100 ML SOLN infusion Inject 0.0205 mg/min into the vein continuous. 02/14/19   Hennie Duos, MD  mometasone-formoterol [facility]) 200-5 MCG/ACT AERO Inhale 2 puffs into the lungs 2 (two) times daily. 02/14/19   Hennie Duos, MD  OXYGEN Inhale 2 L into the lungs continuous.    [provider]  pantoprazole (PROTONIX) 40 MG tablet Take 1 tablet (40 mg total) by mouth daily. 02/14/19   Hennie Duos, MD  polyethylene glycol [facility] Floria Raveling) packet Take 17 g by mouth daily. 02/14/19   Hennie Duos, MD  spironolactone (ALDACTONE) 25 MG tablet TAKE 0.5 TABLETS (12.5 MG TOTAL) BY MOUTH AT BEDTIME. 02/18/19   Bensimhon, Shaune Pascal, MD  sucralfate (CARAFATE) 1 g tablet Take 1 tablet (1 g total) by  mouth 4 (four) times daily -  with meals and at bedtime. 02/14/19   Hennie Duos, MD  tamsulosin (FLOMAX) 0.4 MG CAPS capsule Take 2 capsules (0.8 mg total) by mouth daily after supper. 02/14/19   Hennie Duos, MD  torsemide (DEMADEX) 20 MG tablet Take 2 tablets (40 mg total) by mouth daily. 04/21/19   Bensimhon, Shaune Pascal, MD     Family History  Problem Relation Age of Onset  . Heart disease Father   . Heart attack Father 26  . Hypertension Father   . Hypertension Mother     Social History   Socioeconomic History  . Marital status: Married    Spouse name: Not on file  . Number of children: Not on file  . Years of education: Not on file  . Highest education level: Not on file  Occupational History  . Not on file  Social Needs  . Financial resource strain: Not on file  . Food insecurity    Worry: Not on file    Inability: Not on file  . Transportation needs    Medical: Not on file    Non-medical: Not on file  Tobacco Use  . Smoking status: Former Smoker    Packs/day: 0.75    Years: 17.00    Pack years: 12.75    Types: Cigarettes    Start date: 05/08/1959    Quit date: 06/20/1976    Years since quitting: 43.0  . Smokeless tobacco: Never Used  Substance and Sexual Activity  . Alcohol use: No  . Drug use: No  . Sexual activity: Not on file  Lifestyle  . Physical activity    Days per week: Not on file    Minutes per session: Not on file  . Stress: Not on file  Relationships  . Social Herbalist on phone: Not on file    Gets together: Not on file    Attends religious service: Not on file    Active member of club or organization: Not on file    Attends meetings of clubs or organizations: Not on file    Relationship status: Not on file  Other Topics Concern  . Not on file  Social History Narrative  . Not on file     Review of Systems  Constitutional: Positive for fatigue.  Respiratory: Negative.   Cardiovascular: Negative.   Gastrointestinal:  Negative.   Genitourinary: Negative.   Musculoskeletal: Negative.   Neurological: Negative.     Review of Systems: A 12 point ROS discussed and pertinent positives are indicated in the HPI above.  All other systems are negative.  Physical Exam No direct physical exam was performed (except for noted visual exam findings with Video Visits).   Vital Signs: There were no vitals taken for this visit.  Imaging: Ir Radiologist Eval & Mgmt  Result Date: 07/08/2019 Please refer to notes tab for details about interventional procedure. (Op Note)  Labs:  CBC: Recent Labs    02/04/19 1112 02/05/19 02/06/19 1850 02/11/19 2141  WBC 5.9 5.2  5.2 7.3 5.8  HGB 12.0* 11.4* 12.5* 11.6*  HCT 37.5* 35* 38.6* 35.5*  PLT 179 166 167 126*    COAGS: Recent Labs    01/06/19 1650  01/22/19 0435 01/23/19 0300 02/06/19 1850 02/11/19 2141  INR  --    < > 1.32 1.33 1.3* 1.3*  APTT 33  --   --   --  28  --    < > = values in this interval not displayed.    BMP: Recent Labs    02/04/19 1112 02/05/19 02/06/19 1850 02/11/19 2141 02/14/19 1422  NA 133* 137 132* 132* 135  K 4.3 4.1 4.9 4.0 3.9  CL 95*  --  102 97* 101  CO2 32  --  25 24 23   GLUCOSE 88  --  125* 128* 113*  BUN 37* 38* 39* 30* 25*  CALCIUM 9.2  --  8.8* 8.9 9.0  CREATININE 1.41* 1.3 1.66* 1.55* 1.25*  GFRNONAA 48*  --  39* 43* 55*  GFRAA 55*  --  45* 49* >60    LIVER FUNCTION TESTS: Recent Labs    02/04/19 1112 02/06/19 1850 02/11/19 2141 02/14/19 1422  BILITOT 2.9* 2.4* 2.6* 1.7*  AST 45* 46* 32 31  ALT 17 18 14 14   ALKPHOS 109 118 112 100  PROT 7.3 7.8 7.2 7.2  ALBUMIN 2.8* 3.0* 2.9* 2.9*     Assessment and Plan:  I spoke with Mr. Desroches over the phone.  His renal function has improved slightly since hospitalization in March but recent outside lab tests still show renal impairment with creatinine of 2.31 and estimated GFR of 30 mL/min on 06/27/2019.  Given that he should not receive IV contrast at  this time, a contrast-enhanced follow-up CT was not performed.  He has not had a CT since March and I recommended that we perform unenhanced CT of the abdomen and pelvis sometime in September or October to make sure that the retroperitoneal hemorrhage has resolved and the ablation site looks stable.  Since it has been 5 years since ablation, it is unlikely that we will have to assess the ablation site any further after that on a routine basis.   Electronically Signed: Azzie Roup 07/08/2019, 2:45 PM     I spent a total of 15 Minutes in remote  clinical consultation, greater than 50% of which was counseling/coordinating care post ablation of a left renal carcinoma.    Visit type: Audio only (telephone). Audio (no video) only due to patient's lack of internet/smartphone capability. Alternative for in-person consultation at [facility], [place] [address] [place], [place], [place]. This visit type was conducted due to national recommendations for restrictions regarding the COVID-19 Pandemic (e.g. social distancing).  This format is felt to be most appropriate for this patient at this time.  All issues noted in this document were discussed and addressed.

## 2019-07-09 ENCOUNTER — Other Ambulatory Visit: Payer: Self-pay | Admitting: *Deleted

## 2019-07-09 ENCOUNTER — Other Ambulatory Visit (HOSPITAL_COMMUNITY): Payer: Self-pay | Admitting: Interventional Radiology

## 2019-07-09 DIAGNOSIS — C642 Malignant neoplasm of left kidney, except renal pelvis: Secondary | ICD-10-CM

## 2019-07-17 ENCOUNTER — Other Ambulatory Visit (HOSPITAL_COMMUNITY): Payer: Self-pay | Admitting: Internal Medicine

## 2019-07-21 ENCOUNTER — Other Ambulatory Visit (HOSPITAL_COMMUNITY): Payer: Self-pay

## 2019-07-21 ENCOUNTER — Other Ambulatory Visit: Payer: Self-pay

## 2019-07-21 ENCOUNTER — Ambulatory Visit (HOSPITAL_COMMUNITY)
Admission: RE | Admit: 2019-07-21 | Discharge: 2019-07-21 | Disposition: A | Discharge status: Home / Self Care | Payer: Medicare Other | Source: Ambulatory Visit | Attending: Internal Medicine | Admitting: Internal Medicine

## 2019-07-21 DIAGNOSIS — I5022 Chronic systolic (congestive) heart failure: Secondary | ICD-10-CM | POA: Diagnosis not present

## 2019-07-21 DIAGNOSIS — N183 Chronic kidney disease, stage 3 unspecified: Secondary | ICD-10-CM

## 2019-07-21 NOTE — Progress Notes (Signed)
Heart Failure TeleHealth Note  Due to national recommendations of social distancing due to Opp 19, Audio/video telehealth visit is felt to be most appropriate for this patient at this time.  See MyChart message from today for patient consent regarding telehealth for [facility].  Date:  07/21/2019   ID:  Spencer Rice, DOB 1941/08/17, MRN [id number]  Location: Home  Provider location: [place] [facility] Type of Visit: Established patient  PCP:  [name]., MD  Primary HF: Spencer Rice  Chief Complaint: HF follow-up   History of Present Illness:  Spencer Rice is a 78 y.o. 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    . Admitted 1/26-2/26/2020 with cardiogenic shock.PICC placed 1/27 with initial CVP 26 coox 38% -> Milrinone started with improved diuresis and renal function. Echo 01/01/19 EF 10% with severe MR/TR, biatrial enlargement, RV with moderately reduced function. Taken for [facility] 01/07/19 with Mild, non-obstructive CAD, Moderate PAH with normal PAPI, and normal CO on milrinone 0.25 mcg/kg/min. PFTs 01/10/2001 FVC 2.28 (54%), FEV 2.0 (64%), DLCO 14.64 (53%). Discussed for LVADconsideration at [facility], and had planned to move forward,but patient had spontaneous RP bleed with symptomatic anemia complicating his admission.Discharged to SNF for rehab,   He presents via audio/video conferencing for a telehealth visit today. Remains on milrinone 0.25. Has HHRN come once a week on Friday. At last visit creatinine was rising and was up to 2.3.Creatinine rechecked on 8/7 and was back down to 1.97. Feels ok. Frustrated about driving restriction placed on him by PCP. Says he feels good. Able to do ADLs without problem.Walking around backyard without  problem. On torsemide 40/20. Weight stable around 171-172. No edema orthopnea or PND. Good appetite. No palpitations or ICD firings. Wife died recentlydue to large CVA.   He denies symptoms worrisome for COVID 19.   Past Medical History:  Diagnosis Date  . AICD (automatic cardioverter/defibrillator) present   . Cancer of left kidney ([employer])    S/P OR 05/2014  . CHF (congestive heart failure) ([place])   . CKD (chronic kidney disease)   . 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 Coronary artery disease   . DVT (deep venous thrombosis) (HCC) 1983   LLE  . GERD (gastroesophageal reflux disease)   . History of hiatal hernia   . Hypertension    Past Surgical History:  Procedure Laterality Date  . CARDIAC CATHETERIZATION  1980's  . IMPLANTABLE CARDIOVERTER DEFIBRILLATOR IMPLANT  07/21/2010  . IR FLUORO GUIDE CV LINE RIGHT  01/23/2019  . IR GENERIC HISTORICAL  12/23/2014   IR RADIOLOGIST EVAL & MGMT 12/23/2014 [name], MD [facility] INTERV RAD  . IR GENERIC HISTORICAL  07/20/2016   IR RADIOLOGIST EVAL & MGMT 07/20/2016 [facility] INTERV RAD  . IR RADIOLOGIST EVAL & MGMT  07/04/2017  . IR RADIOLOGIST EVAL & MGMT  07/08/2019  . IR US GUIDE VASC ACCESS RIGHT  01/23/2019  . RENAL CRYOABLATION Left 05/2014  . RIGHT/LEFT HEART CATH AND CORONARY ANGIOGRAPHY N/A 01/07/2019   Procedure: RIGHT/LEFT HEART CATH AND CORONARY ANGIOGRAPHY;  Surgeon: Jolaine Artist, MD;  Location: [place] [facility];  Service: Cardiovascular;  Laterality: N/A;     Current Outpatient Medications  Medication Sig Dispense Refill  . acetaminophen (  TYLENOL) 325 MG tablet Take 2 tablets (650 mg total) by mouth every 4 (four) hours as needed for headache or mild pain. (Patient taking differently: Take 650 mg by mouth every 6 (six) hours as needed for mild pain or headache. )    . amiodarone (PACERONE) 200 MG tablet Take 1 tablet (200 mg total) by mouth daily. 30 tablet 0  . benzonatate (TESSALON) 100 MG  capsule Take 1 capsule (100 mg total) by mouth every 8 (eight) hours. 30 capsule 0  . KLOR-CON M20 20 MEQ tablet TAKE 2 TABLETS BY MOUTH DAILY 180 tablet 1  . lactobacillus (FLORANEX/LACTINEX) PACK Take 1 packet (1 g total) by mouth 3 (three) times daily with meals. 90 packet 0  . metolazone (ZAROXOLYN) 2.5 MG tablet Take 1 tablet (2.5 mg total) by mouth once a week. Every Friday. 15 tablet 3  . milrinone (PRIMACOR) 20 MG/100 ML SOLN infusion Inject 0.0205 mg/min into the vein continuous. 100 mL 0  . mometasone-formoterol (DULERA) 200-5 MCG/ACT AERO Inhale 2 puffs into the lungs 2 (two) times daily. 8.8 g 0  . OXYGEN Inhale 2 L into the lungs continuous.    . pantoprazole (PROTONIX) 40 MG tablet Take 1 tablet (40 mg total) by mouth daily. 30 tablet 5  . polyethylene glycol (MIRALAX / GLYCOLAX) packet Take 17 g by mouth daily. 30 each 0  . spironolactone (ALDACTONE) 25 MG tablet TAKE 0.5 TABLETS (12.5 MG TOTAL) BY MOUTH AT BEDTIME. 45 tablet 1  . sucralfate (CARAFATE) 1 g tablet Take 1 tablet (1 g total) by mouth 4 (four) times daily -  with meals and at bedtime. 120 tablet 0  . tamsulosin (FLOMAX) 0.4 MG CAPS capsule Take 2 capsules (0.8 mg total) by mouth daily after supper. 60 capsule 0  . torsemide (DEMADEX) 20 MG tablet Take 2 tablets (40 mg total) by mouth daily. 360 tablet 1   No current facility-administered medications for this encounter.     Allergies:   Patient has no known allergies.   Social History:  The patient  reports that he quit smoking about 43 years ago. His smoking use included cigarettes. He started smoking about 60 years ago. He has a 12.75 pack-year smoking history. He has never used smokeless tobacco. He reports that he does not drink alcohol or use drugs.   Family History:  The patient's family history includes Heart attack (age of onset: 64) in his father; Heart disease in his father; Hypertension in his father and mother.   ROS:  Please see the history of present  illness.   All other systems are personally reviewed and negative.   Exam:  (Video/Tele Health Call; Exam is subjective and or/visual.) General:  Speaks in full sentences. No resp difficulty. Lungs: Normal respiratory effort with conversation.  Abdomen: Non-distended per patient report Extremities: Pt endorses occasional edema. But none today  Neuro: Alert & oriented x 3.   Recent Labs: 01/06/2019: TSH 0.906 02/06/2019: B Natriuretic Peptide 2,172.9; Magnesium 2.2 02/11/2019: Hemoglobin 11.6; Platelets 126 02/14/2019: ALT 14; BUN 25; Creatinine, Ser 1.25; Potassium 3.9; Sodium 135  Personally reviewed   Wt Readings from Last 3 Encounters:  02/14/19 81.2 kg (179 lb)  02/14/19 81.2 kg (179 lb)  02/10/19 78.6 kg (173 lb 3.2 oz)      ASSESSMENT AND PLAN:  1. Chronic Systolic Heart Failure ->recent cardiogenic shock: Due to primarily to nonischemic cardiomyopathy (out of proportion to CAD). [place] 2015 with moderate disease. Has Biotronik BiV ICD. Initial CO-OX  38% so milrinone started on 1/28.  -ECHO 01/01/19 EF 10% with severe MR/TR, biatrial enlargement, RV with moderately reduced function.  - Improved with inotropic support. Stable NYHA III  - Continue milrinone 0.25 mcg/kg/min. Managed through [facility]. Labs every Friday with [facility].  - Volume status stable on torsemide 40/20 but creatinine has been rising. However most recent labs from [facility] on 8/720 reviewed and creatinine improved to 1.97. Continue current dose - Continue spiro 12.5 mg daily - No BB with inotrope dependence - Discussed at VAD MRB meeting in 2/20  and would be possible VAD candidate if he gets stronger. However he continues to say he is feeling better but not ready for VAD yet.   - Repeat echo in next 1-2 months as COVID restrictions improve  2. H/o RP bleed with symptomatic anemia - Was on [facility] for h/o LV clot (no longer present) - Off AC currently. HGb stable. Will not restart.   3. CKD Stage III, Previously creatinine  baseline 1.8-2.4.  - Continue labs with [facility]. Recent creatinine improved 2.31 -> 1.97.  - Continue to follow closely  4. H/o Recurrent Klebsiella UTI - treatment per PCP  5. Driving restrictions - HF remains stable on milrinone.  - No recent events with ICD - No clear indication to restrict driving from my standpoint. Will d/w PCP.  No s/s of COVID infection. Time spent 22 mins discussing above.   Signed, Glori Bickers, MD  07/21/2019 12:21 PM  [facility] [address] [facility] [place] [address] [phone] (office) [phone] (fax)

## 2019-07-31 ENCOUNTER — Other Ambulatory Visit (HOSPITAL_COMMUNITY): Payer: Self-pay | Admitting: Internal Medicine

## 2019-08-07 ENCOUNTER — Other Ambulatory Visit (HOSPITAL_COMMUNITY): Payer: Self-pay | Admitting: *Deleted

## 2019-08-07 MED ORDER — AMIODARONE HCL 200 MG PO TABS: 200.0000 mg | ORAL_TABLET | Freq: Every day | ORAL | 0 refills | Status: DC

## 2019-08-13 ENCOUNTER — Other Ambulatory Visit: Payer: Self-pay | Admitting: Internal Medicine

## 2019-08-14 ENCOUNTER — Telehealth (HOSPITAL_COMMUNITY): Payer: Self-pay | Admitting: Cardiology

## 2019-08-14 NOTE — Telephone Encounter (Signed)
Abnormal labs received from [facility] drawn 08/08/19 K 4.4 Cr 2.34 BUN 54 NA 140  Per Amy Clegg,NP Hold metolazone 08/15/19   Pt aware and voiced understanding

## 2019-08-19 ENCOUNTER — Other Ambulatory Visit (HOSPITAL_COMMUNITY): Payer: Self-pay | Admitting: Internal Medicine

## 2019-08-21 ENCOUNTER — Encounter (HOSPITAL_BASED_OUTPATIENT_CLINIC_OR_DEPARTMENT_OTHER): Payer: Self-pay | Admitting: *Deleted

## 2019-08-21 ENCOUNTER — Emergency Department (HOSPITAL_BASED_OUTPATIENT_CLINIC_OR_DEPARTMENT_OTHER): Payer: Medicare Other

## 2019-08-21 ENCOUNTER — Other Ambulatory Visit: Payer: Self-pay

## 2019-08-21 ENCOUNTER — Emergency Department (HOSPITAL_BASED_OUTPATIENT_CLINIC_OR_DEPARTMENT_OTHER)
Admission: EM | Admit: 2019-08-21 | Discharge: 2019-08-22 | Discharge status: Against Medical Advice | Payer: Medicare Other | Attending: Emergency Medicine | Admitting: Emergency Medicine

## 2019-08-21 DIAGNOSIS — Z87891 Personal history of nicotine dependence: Secondary | ICD-10-CM | POA: Insufficient documentation

## 2019-08-21 DIAGNOSIS — I5023 Acute on chronic systolic (congestive) heart failure: Secondary | ICD-10-CM | POA: Diagnosis not present

## 2019-08-21 DIAGNOSIS — I509 Heart failure, unspecified: Secondary | ICD-10-CM

## 2019-08-21 DIAGNOSIS — N183 Chronic kidney disease, stage 3 (moderate): Secondary | ICD-10-CM | POA: Insufficient documentation

## 2019-08-21 DIAGNOSIS — I2510 Atherosclerotic heart disease of native coronary artery without angina pectoris: Secondary | ICD-10-CM | POA: Insufficient documentation

## 2019-08-21 DIAGNOSIS — J449 Chronic obstructive pulmonary disease, unspecified: Secondary | ICD-10-CM | POA: Insufficient documentation

## 2019-08-21 DIAGNOSIS — I130 Hypertensive heart and chronic kidney disease with heart failure and stage 1 through stage 4 chronic kidney disease, or unspecified chronic kidney disease: Secondary | ICD-10-CM | POA: Insufficient documentation

## 2019-08-21 DIAGNOSIS — Z8673 Personal history of transient ischemic attack (TIA), and cerebral infarction without residual deficits: Secondary | ICD-10-CM | POA: Diagnosis not present

## 2019-08-21 DIAGNOSIS — R0602 Shortness of breath: Secondary | ICD-10-CM | POA: Diagnosis present

## 2019-08-21 DIAGNOSIS — Z79899 Other long term (current) drug therapy: Secondary | ICD-10-CM | POA: Diagnosis not present

## 2019-08-21 LAB — COMPREHENSIVE METABOLIC PANEL
ALT: 27 U/L (ref 0–44)
AST: 44 U/L — ABNORMAL HIGH (ref 15–41)
Albumin: 3.8 g/dL (ref 3.5–5.0)
Alkaline Phosphatase: 77 U/L (ref 38–126)
Anion gap: 12 (ref 5–15)
BUN: 42 mg/dL — ABNORMAL HIGH (ref 8–23)
CO2: 23 mmol/L (ref 22–32)
Calcium: 9.3 mg/dL (ref 8.9–10.3)
Chloride: 100 mmol/L (ref 98–111)
Creatinine, Ser: 2 mg/dL — ABNORMAL HIGH (ref 0.61–1.24)
GFR calc Af Amer: 36 mL/min — ABNORMAL LOW (ref >60)
GFR calc non Af Amer: 31 mL/min — ABNORMAL LOW (ref >60)
Glucose, Bld: 123 mg/dL — ABNORMAL HIGH (ref 70–99)
Potassium: 5 mmol/L (ref 3.5–5.1)
Sodium: 135 mmol/L (ref 135–145)
Total Bilirubin: 0.9 mg/dL (ref 0.3–1.2)
Total Protein: 7.5 g/dL (ref 6.5–8.1)

## 2019-08-21 LAB — CBC WITH DIFFERENTIAL/PLATELET
Abs Immature Granulocytes: 0.02 10*3/uL (ref 0.00–0.07)
Basophils Absolute: 0 10*3/uL (ref 0.0–0.1)
Basophils Relative: 1 %
Eosinophils Absolute: 0.1 10*3/uL (ref 0.0–0.5)
Eosinophils Relative: 1 %
HCT: 36.9 % — ABNORMAL LOW (ref 39.0–52.0)
Hemoglobin: 11.8 g/dL — ABNORMAL LOW (ref 13.0–17.0)
Immature Granulocytes: 0 %
Lymphocytes Relative: 22 %
Lymphs Abs: 1.3 10*3/uL (ref 0.7–4.0)
MCH: 27.5 pg (ref 26.0–34.0)
MCHC: 32 g/dL (ref 30.0–36.0)
MCV: 86 fL (ref 80.0–100.0)
Monocytes Absolute: 0.6 10*3/uL (ref 0.1–1.0)
Monocytes Relative: 10 %
Neutro Abs: 4 10*3/uL (ref 1.7–7.7)
Neutrophils Relative %: 66 %
Platelets: 177 10*3/uL (ref 150–400)
RBC: 4.29 MIL/uL (ref 4.22–5.81)
RDW: 14.1 % (ref 11.5–15.5)
WBC: 6.1 10*3/uL (ref 4.0–10.5)
nRBC: 0 % (ref 0.0–0.2)

## 2019-08-21 LAB — URINALYSIS, ROUTINE W REFLEX MICROSCOPIC
Bilirubin Urine: NEGATIVE
Glucose, UA: NEGATIVE mg/dL
Hgb urine dipstick: NEGATIVE
Ketones, ur: NEGATIVE mg/dL
Leukocytes,Ua: NEGATIVE
Nitrite: NEGATIVE
Protein, ur: NEGATIVE mg/dL
Specific Gravity, Urine: 1.015 (ref 1.005–1.030)
pH: 5 (ref 5.0–8.0)

## 2019-08-21 LAB — BRAIN NATRIURETIC PEPTIDE: B Natriuretic Peptide: 2515 pg/mL — ABNORMAL HIGH (ref 0.0–100.0)

## 2019-08-21 NOTE — ED Provider Notes (Signed)
[facility]ENCY DEPARTMENT Provider Note   CSN: [id number] Arrival date & time: 08/21/19  1927     History   Chief Complaint Chief Complaint  Patient presents with   Shortness of Breath    HPI Spencer Rice is a 78 y.o. male.     The history is provided by the patient and medical records. No language interpreter was used.  Shortness of Breath  Spencer Rice is a 78 y.o. male who presents to the Emergency Department complaining of shortness of breath. He has a history of congestive heart failure and presents for evaluation of one week of shortness of breath. He complains of shortness of breath described as dyspnea on exertion as well as significant orthopnea when trying to sleep at night. He usually uses two pillows at night but for the last several days needed to sleep in the recliner. He still is waking up and having to pace because of difficulty breathing. He denies any fevers, chest pain, cough, abdominal pain, nausea, vomiting. He does complain of mild leg swelling as well as decreased urinary output, difficulty voiding and pain at the end of his urine stream. He states that he completed a course of antibiotics one week ago but thought that he was supposed to be on continued antibiotics but has not received a refill for this. He is unsure who prescribed antibiotic or what it was. He also states that he ran out of one of his home medications yesterday but he is unsure what it is. He weighs himself daily and today he was 176 pounds. He is on a military known drip and this is scheduled to be changed by his home health nurse tomorrow. No known COBIT 19 exposures. Past Medical History:  Diagnosis Date   AICD (automatic cardioverter/defibrillator) present    Cancer of left kidney ([place])    S/P OR 05/2014   CHF (congestive heart failure) (HCC)    CKD (chronic kidney disease)    COPD (chronic obstructive pulmonary disease) (HCC)    oxygen dependent   Coronary  artery disease    DVT (deep venous thrombosis) (HCC) 1983   LLE   GERD (gastroesophageal reflux disease)    History of hiatal hernia    Hypertension     Patient Active Problem List   Diagnosis Date Noted   Chronic systolic (congestive) heart failure ([place]) 02/08/2019   Cardiogenic shock ([place]) 02/01/2019   Volume overload 02/01/2019   Retroperitoneal bleed 02/01/2019   Urinary tract infection due to Klebsiella species 02/01/2019   Weakness generalized    Tachypnea    Sinus tachycardia    Hyponatremia    Acute blood loss anemia    DNR (do not resuscitate) discussion    Palliative care by specialist    Malnutrition of moderate degree 01/06/2019   CKD (chronic kidney disease) stage 3, GFR 30-59 ml/min (HCC) 12/30/2018   PVCs (premature ventricular contractions) 12/30/2018   Acute on chronic systolic congestive heart failure ([place]) 12/29/2018   CHF (congestive heart failure) ([place]) 05/03/2018   Acute on chronic systolic (congestive) heart failure ([place]) 05/03/2018   Glucose intolerance (impaired glucose tolerance) 06/23/2016   Vitreous floaters 12/08/2015   TIA (transient ischemic attack) 12/08/2015   SOB (shortness of breath) 12/08/2015   Posterior vitreous detachment of both eyes 12/08/2015   Pain due to dental caries 12/08/2015   Other abnormal glucose 12/08/2015   Osteoarthrosis 12/08/2015   Long term (current) use of anticoagulants 12/08/2015   Kidney mass 12/08/2015  Hypersomnolence 12/08/2015   Hyperopia with presbyopia of both eyes 12/08/2015   Hyperlipidemia 12/08/2015   History of orthopnea 12/08/2015   COPD (chronic obstructive pulmonary disease) ([place]) 12/08/2015   Apical mural thrombus without MI 12/08/2015   AKI (acute kidney injury) ([place]) 12/08/2015   Abnormal CT scan, kidney 12/08/2015   Abdominal bloating 12/08/2015   Biventricular ICD (implantable cardioverter-defibrillator) in place 12/07/2015   Chest pain at rest  11/29/2015   Essential hypertension 11/29/2015   Acute on chronic systolic CHF (congestive heart failure), NYHA class 1 ([place]) 11/29/2015   Gastroesophageal reflux disease without esophagitis 11/29/2015   Pain in the chest    Renal cancer ([place]) 08/16/2015   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[place])    Cancer of kidney ([place]) 01/19/2015   Hematuria 09/04/2014   Psychosexual dysfunction with inhibited sexual excitement 07/29/2014   Pelvic pain in male 07/29/2014   Increased urinary frequency 07/29/2014   Heartburn 07/29/2014   EKG, abnormal 07/29/2014   Corneal scar 07/29/2014   Combined form of senile cataract 07/29/2014   Cardiomyopathy ([place]) 07/29/2014   CAD (coronary artery disease) 07/29/2014   Disorder of kidney and ureter 04/09/2014    Past Surgical History:  Procedure Laterality Date   CARDIAC CATHETERIZATION  1980's   IMPLANTABLE CARDIOVERTER DEFIBRILLATOR IMPLANT  07/21/2010   IR FLUORO GUIDE CV LINE RIGHT  01/23/2019   IR GENERIC HISTORICAL  12/23/2014   IR RADIOLOGIST EVAL & MGMT 12/23/2014 [name], MD [facility] INTERV RAD   IR GENERIC HISTORICAL  07/20/2016   IR RADIOLOGIST EVAL & MGMT 07/20/2016 [facility] INTERV RAD   IR RADIOLOGIST EVAL & MGMT  07/04/2017   IR RADIOLOGIST EVAL & MGMT  07/08/2019   IR US GUIDE VASC ACCESS RIGHT  01/23/2019   RENAL CRYOABLATION Left 05/2014   RIGHT/LEFT HEART CATH AND CORONARY ANGIOGRAPHY N/A 01/07/2019   Procedure: RIGHT/LEFT HEART CATH AND CORONARY ANGIOGRAPHY;  Surgeon: Jolaine Artist, MD;  Location: [place] [facility];  Service: Cardiovascular;  Laterality: N/A;        Home Medications    Prior to Admission medications   Medication Sig Start Date End Date Taking? Authorizing Provider  milrinone (PRIMACOR) 20 MG/100 ML SOLN infusion Inject into the vein. 02/14/19  Yes [provider]  acetaminophen (TYLENOL) 325 MG tablet Take 2 tablets (650 mg total) by mouth every 4 (four) hours as needed for  headache or mild pain. Patient taking differently: Take 650 mg by mouth every 6 (six) hours as needed for mild pain or headache.  01/29/19   Shirley Friar, PA-C  amiodarone (PACERONE) 200 MG tablet Take 1 tablet (200 mg total) by mouth daily. 08/07/19   Bensimhon, Shaune Pascal, MD  benzonatate (TESSALON) 100 MG capsule Take 1 capsule (100 mg total) by mouth every 8 (eight) hours. 02/14/19   Hennie Duos, MD  KLOR-CON M20 20 MEQ tablet TAKE 2 TABLETS BY MOUTH DAILY 03/21/19   Bensimhon, Shaune Pascal, MD  lactobacillus (FLORANEX/LACTINEX) PACK Take 1 packet (1 g total) by mouth 3 (three) times daily with meals. 02/14/19   Hennie Duos, MD  metolazone (ZAROXOLYN) 2.5 MG tablet Take 1 tablet (2.5 mg total) by mouth once a week. Every Friday. 06/16/19 09/14/19  Bensimhon, Shaune Pascal, MD  milrinone (PRIMACOR) 20 MG/100 ML SOLN infusion Inject 0.0205 mg/min into the vein continuous. 02/14/19   Hennie Duos, MD  mometasone-formoterol [facility]) 200-5 MCG/ACT AERO Inhale 2 puffs into the lungs 2 (two) times daily. 02/14/19  Hennie Duos, MD  OXYGEN Inhale 2 L into the lungs continuous.    [provider]  pantoprazole (PROTONIX) 40 MG tablet Take 1 tablet (40 mg total) by mouth daily. 02/14/19   Hennie Duos, MD  polyethylene glycol [facility] Floria Raveling) packet Take 17 g by mouth daily. 02/14/19   Hennie Duos, MD  spironolactone (ALDACTONE) 25 MG tablet TAKE 0.5 TABLETS (12.5 MG TOTAL) BY MOUTH AT BEDTIME. 02/18/19   Bensimhon, Shaune Pascal, MD  sucralfate (CARAFATE) 1 g tablet Take 1 tablet (1 g total) by mouth 4 (four) times daily -  with meals and at bedtime. 02/14/19   Hennie Duos, MD  tamsulosin (FLOMAX) 0.4 MG CAPS capsule Take 2 capsules (0.8 mg total) by mouth daily after supper. 02/14/19   Hennie Duos, MD  torsemide (DEMADEX) 20 MG tablet Take 2 tablets (40 mg total) by mouth daily. 04/21/19   Bensimhon, Shaune Pascal, MD    Family History Family History  Problem  Relation Age of Onset   Heart disease Father    Heart attack Father 56   Hypertension Father    Hypertension Mother     Social History Social History   Tobacco Use   Smoking status: Former Smoker    Packs/day: 0.75    Years: 17.00    Pack years: 12.75    Types: Cigarettes    Start date: 05/08/1959    Quit date: 06/20/1976    Years since quitting: 43.2   Smokeless tobacco: Never Used  Substance Use Topics   Alcohol use: No   Drug use: No     Allergies   Patient has no known allergies.   Review of Systems Review of Systems  Respiratory: Positive for shortness of breath.   All other systems reviewed and are negative.    Physical Exam Updated Vital Signs BP 96/83    Pulse 88    Temp 98 F (36.7 C)    Resp (!) 21    Ht 6\' 1"  (1.854 m)    Wt 80.7 kg    SpO2 99%    BMI 23.48 kg/m   Physical Exam Vitals signs and nursing note reviewed.  Constitutional:      Appearance: He is well-developed.  HENT:     Head: Normocephalic and atraumatic.  Neck:     Comments: JVD to the angle of the mandible Cardiovascular:     Rate and Rhythm: Normal rate and regular rhythm.     Heart sounds: Murmur present.  Pulmonary:     Effort: Pulmonary effort is normal. No respiratory distress.     Breath sounds: Normal breath sounds.  Abdominal:     Palpations: Abdomen is soft.     Tenderness: There is no abdominal tenderness. There is no guarding or rebound.  Musculoskeletal:        General: No tenderness.     Comments: Trace edema to bilateral lower extremities. 2+ DP pulses  Skin:    General: Skin is warm and dry.  Neurological:     Mental Status: He is alert and oriented to person, place, and time.  Psychiatric:        Behavior: Behavior normal.      ED Treatments / Results  Labs (all labs ordered are listed, but only abnormal results are displayed) Labs Reviewed  COMPREHENSIVE METABOLIC PANEL - Abnormal; Notable for the following components:      Result Value    Glucose, Bld 123 (*)  BUN 42 (*)    Creatinine, Ser 2.00 (*)    AST 44 (*)    GFR calc non Af Amer 31 (*)    GFR calc Af Amer 36 (*)    All other components within normal limits  CBC WITH DIFFERENTIAL/PLATELET - Abnormal; Notable for the following components:   Hemoglobin 11.8 (*)    HCT 36.9 (*)    All other components within normal limits  BRAIN NATRIURETIC PEPTIDE - Abnormal; Notable for the following components:   B Natriuretic Peptide 2,515.0 (*)    All other components within normal limits  URINE CULTURE  URINALYSIS, ROUTINE W REFLEX MICROSCOPIC    EKG EKG Interpretation  Date/Time:  Thursday August 21 2019 20:23:18 EDT Ventricular Rate:  88 PR Interval:    QRS Duration: 133 QT Interval:  396 QTC Calculation: 474 R Axis:   -69 Text Interpretation:  Sinus rhythm Prolonged PR interval Nonspecific IVCD with LAD LVH with secondary repolarization abnormality Confirmed by Quintella Reichert [phone]) on 08/21/2019 8:31:43 PM   Radiology Dg Chest Port 1 View  Result Date: 08/21/2019 CLINICAL DATA:  78 year old male with increasing shortness of breath. EXAM: PORTABLE CHEST 1 VIEW COMPARISON:  02/06/2019 portable chest and earlier. FINDINGS: Portable AP upright view at 2018 hours. Stable cardiomegaly and mediastinal contours. Stable tunneled right chest catheter and left chest AICD. Stable lung volumes and ventilation. No overt pulmonary edema. No pneumothorax, pleural effusion or consolidation. Visualized tracheal air column is within normal limits. No acute osseous abnormality identified. IMPRESSION: Stable severe cardiomegaly.  No acute cardiopulmonary abnormality. Electronically Signed   By: Genevie Ann M.D.   On: 08/21/2019 20:25    Procedures Procedures (including critical care time)  Medications Ordered in ED Medications - No data to display   Initial Impression / Assessment and Plan / ED Course  I have reviewed the triage vital signs and the nursing notes.  Pertinent  labs & imaging results that were available during my care of the patient were reviewed by me and considered in my medical decision making (see chart for details).        Patient with CHF, EF of 10% on milrone therapy at home as well as CKD stage III here for evaluation of progressive orthopnea. He feels like he might be slightly more fluid overloaded than usual. He does have JVD but otherwise appears euvolemic. Chest x-ray with cardiomegaly but no evidence of pulmonary edema. BNP is elevated. Creatinine is elevated but stable when compared to outside lab on September 4. On September 4 he had a creatinine of 2.34, BUN 54, sodium 140 and potassium 4.4. It is unclear what medication he ran of yesterday. He does state that he is compliant with torsemide. Several weeks ago he had his torsemide decreased from two tablets BID to two tablets in the morning and one tablet at night. He decided yesterday to increase his dose back up to two tablets twice daily. His metazolone was held last Friday and is supposed to be held tomorrow as well.  Discuss patient with on-call cardiologist, who recommends admission for further evaluation, could potentially need increase in his milrinone. Discussed with patient recommendations for admission and patient declines. Patient states that he was in the hospital for two months in January followed by rehab. He also states that he lost his wife two months ago and does not want to be in the hospital for any reason. He understands the risks of leaving. Discussed importance of continuing his medications and close  cardiology follow-up. Return precautions discussed.  Final Clinical Impressions(s) / ED Diagnoses   Final diagnoses:  Acute on chronic congestive heart failure, unspecified heart failure type [facility])    ED Discharge Orders    None       Quintella Reichert, MD 08/22/19 [phone]

## 2019-08-21 NOTE — ED Triage Notes (Signed)
Pt c/o SOb x 1 week worse with lying

## 2019-08-22 ENCOUNTER — Telehealth (HOSPITAL_COMMUNITY): Payer: Self-pay

## 2019-08-22 ENCOUNTER — Telehealth: Payer: Self-pay | Admitting: Internal Medicine

## 2019-08-22 MED ORDER — TORSEMIDE 20 MG PO TABS: 40.0000 mg | ORAL_TABLET | Freq: Two times a day (BID) | ORAL | 3 refills | Status: DC

## 2019-08-22 MED ORDER — TORSEMIDE 20 MG PO TABS: 60.0000 mg | ORAL_TABLET | Freq: Every day | ORAL | 1 refills | Status: DC

## 2019-08-22 NOTE — Telephone Encounter (Addendum)
Received call from [place] with [facility] clinic stating that the pt's weight was up 4lbs since stopping metolazone last Friday. Per amy np-c  Pt to take one time dose of metolazone today and increase daily dose of Torsemide to 40mg  bid. Verbal order given to [employer].

## 2019-08-22 NOTE — ED Notes (Signed)
Bladder scan for 235mls

## 2019-08-22 NOTE — Telephone Encounter (Signed)
Called overnight by OSH where patient had presented with increasing orthopnea and concern for fluid retention. Patient self increased his home torsemide to 40mg  BID two days ago without notable increase in UOP. He had not trialed boost with Dan Maker as he had developed AKI with this strategy in the past.    I recommended transfer to [facility] for diuresis and optimization given home Milrinone and CKD but patient apparently refused admission and returned home.   Told OSH ER I would alert Dr. Sung Amabile to these events.   Kenitha Glendinning K. Marletta Lor, MD

## 2019-08-22 NOTE — Addendum Note (Signed)
Addended by: Lindley Magnus A on: 08/22/2019 09:54 AM   Modules accepted: Orders

## 2019-08-22 NOTE — ED Notes (Signed)
Discussed importance with patient to follow up with cardiology first this in the am. Patient verbalized understanding.

## 2019-08-23 ENCOUNTER — Other Ambulatory Visit: Payer: Self-pay | Admitting: Student

## 2019-08-23 ENCOUNTER — Other Ambulatory Visit (HOSPITAL_COMMUNITY): Payer: Self-pay | Admitting: Internal Medicine

## 2019-08-23 LAB — URINE CULTURE: Culture: NO GROWTH

## 2019-08-23 MED ORDER — PANTOPRAZOLE SODIUM 40 MG PO TBEC: 40.0000 mg | DELAYED_RELEASE_TABLET | Freq: Every day | ORAL | 3 refills | Status: AC

## 2019-08-25 ENCOUNTER — Ambulatory Visit (HOSPITAL_COMMUNITY)
Admission: RE | Admit: 2019-08-25 | Discharge: 2019-08-25 | Disposition: A | Discharge status: Home / Self Care | Payer: Medicare Other | Source: Ambulatory Visit | Attending: Internal Medicine | Admitting: Internal Medicine

## 2019-08-25 ENCOUNTER — Other Ambulatory Visit: Payer: Self-pay

## 2019-08-25 DIAGNOSIS — N183 Chronic kidney disease, stage 3 unspecified: Secondary | ICD-10-CM

## 2019-08-25 DIAGNOSIS — I5023 Acute on chronic systolic (congestive) heart failure: Secondary | ICD-10-CM

## 2019-08-25 NOTE — Progress Notes (Signed)
Heart Failure TeleHealth Note  Due to national recommendations of social distancing due to [place] 19, Audio/video telehealth visit is felt to be most appropriate for this patient at this time.  See MyChart message from today for patient consent regarding telehealth for [facility].  Date:  08/25/2019   ID:  Spencer Rice, DOB 1941/07/08, MRN [id number]  Location: Home  Provider location: [place] [facility] Type of Visit: Established patient  PCP:  [name]., MD  Primary HF: Spencer Rice  Chief Complaint: HF follow-up   History of Present Illness:  Spencer Rice is a 78 y.o. 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    . Admitted 1/26-2/26/2020 with cardiogenic shock.PICC placed 1/27 with initial CVP 26 coox 38% -> Milrinone started with improved diuresis and renal function. Echo 01/01/19 EF 10% with severe MR/TR, biatrial enlargement, RV with moderately reduced function. Taken for [facility] 01/07/19 with Mild, non-obstructive CAD, Moderate PAH with normal PAPI, and normal CO on milrinone 0.25 mcg/kg/min. PFTs 01/10/2001 FVC 2.28 (54%), FEV 2.0 (64%), DLCO 14.64 (53%). Discussed for LVADconsideration at [facility], and had planned to move forward,but patient had spontaneous RP bleed with symptomatic anemia complicating his admission.Discharged to SNF for rehab,   He presents via audio/video conferencing for a telehealth visit today. We had a call with him on 07/21/19 and was doing well. However presented to [facility] ED on 08/22/19 with worsening HF symptoms in setting of running out of his weekly metolazone. Found to be volume overloaded. (BNP 2,172 -> 2,515). CXR clear. Offered admission but refused. Went home and took a metolazone and says he woke up and  felt like a new man.  Remains on milrinone 0.25. Able to do all ADLs without problem. Leg edema resolved. No edema orthopnea or PND.  Has HHRN come once a week on Friday. Weight back down 180 -> 172 (baseline). Recent creatinine stable at 2.0.    He denies symptoms worrisome for COVID 19.   Past Medical History:  Diagnosis Date  . AICD (automatic cardioverter/defibrillator) present   . Cancer of left kidney ([place])    S/P OR 05/2014  . CHF (congestive heart failure) ([place])   . CKD (chronic kidney disease)   . 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 Coronary artery disease   . DVT (deep venous thrombosis) (H[facility]) 1983   LLE  . GERD (gastroesophageal reflux disease)   . History of hiatal hernia   . Hypertension    Past Surgical History:  Procedure Laterality Date  . CARDIAC CATHETERIZATION  1980's  . IMPLANTABLE CARDIOVERTER DEFIBRILLATOR IMPLANT  07/21/2010  . IR FLUORO GUIDE CV LINE RIGHT  01/23/2019  . IR GENERIC HISTORICAL  12/23/2014   IR RADIOLOGIST EVAL & MGMT 12/23/2014 [name], MD [facility] INTERV RAD  . IR GENERIC HISTORICAL  07/20/2016   IR RADIOLOGIST EVAL & MGMT 07/20/2016 [facility] INTERV RAD  . IR RADIOLOGIST EVAL & MGMT  07/04/2017  . IR RADIOLOGIST EVAL & MGMT  07/08/2019  . IR US GUIDE VASC ACCESS RIGHT  01/23/2019  . RENAL CRYOABLATION Left 05/2014  . RIGHT/LEFT HEART CATH AND CORONARY ANGIOGRAPHY N/A 01/07/2019   Procedure: RIGHT/LEFT HEART CATH AND CORONARY ANGIOGRAPHY;  Surgeon: Jolaine Artist, MD;  Location:  [place] INVASIVE CV LAB;  Service: Cardiovascular;  Laterality: N/A;     Current Outpatient Medications  Medication Sig Dispense Refill  . acetaminophen (TYLENOL) 325 MG tablet Take 2 tablets (650 mg total) by mouth every 4 (four) hours as needed for headache or mild pain. (Patient taking differently: Take 650 mg by mouth every 6 (six) hours as needed for mild pain or headache. )    . amiodarone (PACERONE) 200 MG tablet Take 1 tablet  (200 mg total) by mouth daily. 30 tablet 0  . benzonatate (TESSALON) 100 MG capsule Take 1 capsule (100 mg total) by mouth every 8 (eight) hours. 30 capsule 0  . KLOR-CON M20 20 MEQ tablet TAKE 2 TABLETS BY MOUTH EVERY DAY 180 tablet 1  . lactobacillus (FLORANEX/LACTINEX) PACK Take 1 packet (1 g total) by mouth 3 (three) times daily with meals. 90 packet 0  . metolazone (ZAROXOLYN) 2.5 MG tablet Take 1 tablet (2.5 mg total) by mouth once a week. Every Friday. 15 tablet 3  . milrinone (PRIMACOR) 20 MG/100 ML SOLN infusion Inject 0.0205 mg/min into the vein continuous. 100 mL 0  . milrinone (PRIMACOR) 20 MG/100 ML SOLN infusion Inject into the vein.    . mometasone-formoterol (DULERA) 200-5 MCG/ACT AERO Inhale 2 puffs into the lungs 2 (two) times daily. 8.8 g 0  . OXYGEN Inhale 2 L into the lungs continuous.    . pantoprazole (PROTONIX) 40 MG tablet Take 1 tablet (40 mg total) by mouth daily. 90 tablet 3  . polyethylene glycol (MIRALAX / GLYCOLAX) packet Take 17 g by mouth daily. 30 each 0  . spironolactone (ALDACTONE) 25 MG tablet TAKE 0.5 TABLETS (12.5 MG TOTAL) BY MOUTH AT BEDTIME. 45 tablet 1  . sucralfate (CARAFATE) 1 g tablet Take 1 tablet (1 g total) by mouth 4 (four) times daily -  with meals and at bedtime. 120 tablet 0  . tamsulosin (FLOMAX) 0.4 MG CAPS capsule Take 2 capsules (0.8 mg total) by mouth daily after supper. 60 capsule 0  . torsemide (DEMADEX) 20 MG tablet Take 2 tablets (40 mg total) by mouth 2 (two) times daily. 360 tablet 3   No current facility-administered medications for this encounter.     Allergies:   Patient has no known allergies.   Social History:  The patient  reports that he quit smoking about 43 years ago. His smoking use included cigarettes. He started smoking about 60 years ago. He has a 12.75 pack-year smoking history. He has never used smokeless tobacco. He reports that he does not drink alcohol or use drugs.   Family History:  The patient's family history  includes Heart attack (age of onset: 58) in his father; Heart disease in his father; Hypertension in his father and mother.   ROS:  Please see the history of present illness.   All other systems are personally reviewed and negative.   Exam:  (Video/Tele Health Call; Exam is subjective and or/visual.) General:  Speaks in full sentences. No resp difficulty. Lungs: Normal respiratory effort with conversation.  Abdomen: Non-distended per patient report Extremities: Pt endorses occasional edema. But none today  Neuro: Alert & oriented x 3.   Recent Labs: 01/06/2019: TSH 0.906 02/06/2019: Magnesium 2.2 08/21/2019: ALT 27; B Natriuretic Peptide 2,515.0; BUN 42; Creatinine, Ser 2.00; Hemoglobin 11.8; Platelets 177; Potassium 5.0; Sodium 135  Personally reviewed   Wt Readings from Last 3 Encounters:  08/21/19 80.7 kg (178 lb)  02/14/19 81.2 kg (179 lb)  02/14/19 81.2 kg (  179 lb)      ASSESSMENT AND PLAN:  1. Chronic Systolic Heart Failure ->recent cardiogenic shock: Due to primarily to nonischemic cardiomyopathy (out of proportion to CAD). [place] 2015 with moderate disease. Has Biotronik BiV ICD. Initial CO-OX 38% so milrinone started on 1/28.  -ECHO 01/01/19 EF 10% with severe MR/TR, biatrial enlargement, RV with moderately reduced function.  - Improved with inotropic support. Stable NYHA III  - Continue milrinone 0.25 mcg/kg/min. Managed through [facility]. Labs every Friday with [facility].  - Volume status recently elevated after running out of his weekly metolazone but now back to baseline after restarting. He is stable on torsemide 40/20 and metolazone every Friday. Creatinine stable at 2.0 but K up to 5.0. Will recheck labs every Friday with [facility] - Continue spiro 12.5 mg daily. Follow potassium closely.  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 However he continues to say he is feeling better but not ready for VAD  yet.   - Repeat echo in next 1-2 months  2. H/o RP bleed with symptomatic anemia - Was on [facility] for h/o LV clot (no longer present) - Off AC currently. HGb stable. Will not restart.   3. CKD Stage III, Previously creatinine baseline 1.8-2.4.  - Continue labs with [facility]. Recent creatinine improved 2.31 -> 1.97. -> 2.0 - Continue to follow closely  4. H/o Recurrent Klebsiella UTI - treatment per PCP  5. Driving restrictions - HF remains stable on milrinone.  - No recent events with ICD - No clear indication to restrict driving from my standpoint. Will d/w PCP.  No s/s of COVID  infection. Time spent 18 mins discussing above.   Signed, Glori Bickers, MD  08/25/2019 3:25 PM  [place] [address] Heart and [place] [address] [phone] (office) [phone] (fax)

## 2019-08-25 NOTE — Telephone Encounter (Signed)
Worked in today 08/25/19.  Virtual visit at 10:40am. Pt verbalized understanding. Pt stated that he will be available at both numbers on his chart.

## 2019-08-26 NOTE — Addendum Note (Signed)
Encounter addended by: Valeda Malm, RN on: 08/26/2019 9:57 AM  Actions taken: Clinical Note Signed

## 2019-08-26 NOTE — Progress Notes (Signed)
LATE ENTRY, message sent to [facility] RN to have labs (bmet) drawn on Friday at regular home visit. AVS mailed.

## 2019-08-26 NOTE — Patient Instructions (Addendum)
Labs to be done on 9/25 by Home Health RN We will only contact you if something comes back abnormal or we need to make some changes. Otherwise no news is good news!  Your physician recommends that you schedule a follow-up appointment in: 2 weeks TELE-HEALTH, scheduled for 09/10/19 at 1020am  At the [facility], you and your health needs are our priority. As part of our continuing mission to provide you with exceptional heart care, we have created designated Provider Care Teams. These Care Teams include your primary Cardiologist (physician) and Advanced Practice Providers (APPs- Physician Assistants and Nurse Practitioners) who all work together to provide you with the care you need, when you need it.   You may see any of the following providers on your designated Care Team at your next follow up: Marland Kitchen Dr Glori Bickers . Dr [name] . Darrick Grinder, NP   Please be sure to bring in all your medications bottles to every appointment.

## 2019-08-28 ENCOUNTER — Other Ambulatory Visit (HOSPITAL_COMMUNITY): Payer: Self-pay | Admitting: Internal Medicine

## 2019-08-29 ENCOUNTER — Other Ambulatory Visit (HOSPITAL_COMMUNITY): Payer: Self-pay

## 2019-08-29 MED ORDER — POTASSIUM CHLORIDE CRYS ER 20 MEQ PO TBCR: 40.0000 meq | EXTENDED_RELEASE_TABLET | Freq: Every day | ORAL | 1 refills | Status: DC

## 2019-09-01 ENCOUNTER — Telehealth (HOSPITAL_COMMUNITY): Payer: Self-pay | Admitting: Cardiology

## 2019-09-01 NOTE — Telephone Encounter (Signed)
Abnormal labs received from [facility] drawn 08/29/19 K 3.5 Cr 2.52 BUN 56   Per Amy Clegg,NP Stop spiro Repeat BMET in 7 days  No answer on both numbers and unable to leave message

## 2019-09-04 ENCOUNTER — Other Ambulatory Visit: Payer: Self-pay

## 2019-09-04 ENCOUNTER — Ambulatory Visit
Admission: RE | Admit: 2019-09-04 | Discharge: 2019-09-04 | Disposition: A | Discharge status: Home / Self Care | Payer: Medicare Other | Source: Ambulatory Visit | Attending: Interventional Radiology | Admitting: Interventional Radiology

## 2019-09-04 DIAGNOSIS — C642 Malignant neoplasm of left kidney, except renal pelvis: Secondary | ICD-10-CM

## 2019-09-05 NOTE — Telephone Encounter (Signed)
Spoke with patient. He was made aware of his recent lab result.  With elevated creatinine, advised to stop Arlyce Harman.  Will have labs rechecked by [facility] in 1 week. Community message sent to [facility].  Verbalized understanding.

## 2019-09-06 ENCOUNTER — Other Ambulatory Visit (HOSPITAL_COMMUNITY): Payer: Self-pay | Admitting: Internal Medicine

## 2019-09-08 ENCOUNTER — Other Ambulatory Visit (HOSPITAL_COMMUNITY): Payer: Self-pay | Admitting: Internal Medicine

## 2019-09-10 ENCOUNTER — Other Ambulatory Visit: Payer: Self-pay

## 2019-09-10 ENCOUNTER — Ambulatory Visit (HOSPITAL_COMMUNITY)
Admission: RE | Admit: 2019-09-10 | Discharge: 2019-09-10 | Disposition: A | Discharge status: Home / Self Care | Payer: Medicare Other | Source: Ambulatory Visit | Attending: Internal Medicine | Admitting: Internal Medicine

## 2019-09-10 DIAGNOSIS — I5022 Chronic systolic (congestive) heart failure: Secondary | ICD-10-CM

## 2019-09-10 DIAGNOSIS — N179 Acute kidney failure, unspecified: Secondary | ICD-10-CM

## 2019-09-10 NOTE — Progress Notes (Signed)
Heart Failure TeleHealth Note  Due to national recommendations of social distancing due to [place] 19, Audio/video telehealth visit is felt to be most appropriate for this patient at this time.  See MyChart message from today for patient consent regarding telehealth for [facility].  Date:  09/10/2019   ID:  Spencer Rice, DOB 1941-04-05, MRN [id number]  Location: Home  Provider location: [place] [facility] Type of Visit: Established patient  PCP:  Spencer Rice., MD  Primary HF: [place]  Chief Complaint: HF follow-up   History of Present Illness:  Spencer Rice is a 78 y.o. 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    . Admitted 1/26-2/26/2020 with cardiogenic shock.PICC placed 1/27 with initial CVP 26 coox 38% -> Milrinone started with improved diuresis and renal function. Echo 01/01/19 EF 10% with severe MR/TR, biatrial enlargement, RV with moderately reduced function. Taken for [facility] 01/07/19 with Mild, non-obstructive CAD, Moderate PAH with normal PAPI, and normal CO on milrinone 0.25 mcg/kg/min. PFTs 01/10/2001 FVC 2.28 (54%), FEV 2.0 (64%), DLCO 14.64 (53%). Discussed for LVADconsideration at [facility], and had planned to move forward,but patient had spontaneous RP bleed with symptomatic anemia complicating his admission.Discharged to SNF for rehab,   He presents via audio/video conferencing for a telehealth visit today. We had a call with him on 07/21/19 and was doing well. However presented to [facility] ED on 08/22/19 with worsening HF symptoms in setting of running out of his weekly metolazone. Found to be volume overloaded. (BNP 2,172 -> 2,515). CXR clear. He says he feels good. Taking torsemide 40/30. On metolazone 1x/week. On milrinone. Weight  stable at 171. No edema, orthopnea or PND. No fevers or chills. Doing all ADLs without problem. On 08/29/19 creatinine up to 2.5 and spiro stopped. Has repeat BMET tomorrow.    He denies symptoms worrisome for COVID 19.   Past Medical History:  Diagnosis Date  . AICD (automatic cardioverter/defibrillator) present   . Cancer of left kidney ([place])    S/P OR 05/2014  . CHF (congestive heart failure) ([place])   . CKD (chronic kidney disease)   . 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 Coronary artery disease   . DVT (deep venous thrombosis) ([facility]) 1983   LLE  . GERD (gastroesophageal reflux disease)   . History of hiatal hernia   . Hypertension    Past Surgical History:  Procedure Laterality Date  . CARDIAC CATHETERIZATION  1980's  . IMPLANTABLE CARDIOVERTER DEFIBRILLATOR IMPLANT  07/21/2010  . IR FLUORO GUIDE CV LINE RIGHT  01/23/2019  . IR GENERIC HISTORICAL  12/23/2014   IR RADIOLOGIST EVAL & MGMT 12/23/2014 Spencer Edouard, MD [facility] INTERV RAD  . IR GENERIC HISTORICAL  07/20/2016   IR RADIOLOGIST EVAL & MGMT 07/20/2016 [facility] INTERV RAD  . IR RADIOLOGIST EVAL & MGMT  07/04/2017  . IR RADIOLOGIST EVAL & MGMT  07/08/2019  . IR US GUIDE VASC ACCESS RIGHT  01/23/2019  . RENAL CRYOABLATION Left 05/2014  . RIGHT/LEFT HEART CATH AND CORONARY ANGIOGRAPHY N/A 01/07/2019   Procedure: RIGHT/LEFT HEART CATH AND CORONARY ANGIOGRAPHY;  Surgeon: Jolaine Artist, MD;  Location: [place] CV LAB;  Service: Cardiovascular;  Laterality: N/A;     Current Outpatient Medications  Medication Sig Dispense Refill  . acetaminophen (TYLENOL) 325 MG tablet Take 2 tablets (650 mg total) by mouth every 4 (four) hours as needed for headache or mild pain. (Patient taking differently: Take 650 mg by mouth every 6 (six) hours as needed for mild pain or headache. )    . amiodarone (PACERONE) 200 MG tablet TAKE 1 TABLET BY MOUTH EVERY DAY 30 tablet 0  . benzonatate (TESSALON) 100 MG capsule  Take 1 capsule (100 mg total) by mouth every 8 (eight) hours. 30 capsule 0  . lactobacillus (FLORANEX/LACTINEX) PACK Take 1 packet (1 g total) by mouth 3 (three) times daily with meals. 90 packet 0  . metolazone (ZAROXOLYN) 2.5 MG tablet Take 1 tablet (2.5 mg total) by mouth once a week. Every Friday. 15 tablet 3  . milrinone (PRIMACOR) 20 MG/100 ML SOLN infusion Inject 0.0205 mg/min into the vein continuous. 100 mL 0  . milrinone (PRIMACOR) 20 MG/100 ML SOLN infusion Inject into the vein.    . mometasone-formoterol (DULERA) 200-5 MCG/ACT AERO Inhale 2 puffs into the lungs 2 (two) times daily. 8.8 g 0  . OXYGEN Inhale 2 L into the lungs continuous.    . pantoprazole (PROTONIX) 40 MG tablet Take 1 tablet (40 mg total) by mouth daily. 90 tablet 3  . polyethylene glycol (MIRALAX / GLYCOLAX) packet Take 17 g by mouth daily. 30 each 0  . potassium chloride SA (KLOR-CON M20) 20 MEQ tablet Take 2 tablets (40 mEq total) by mouth daily. 180 tablet 1  . spironolactone (ALDACTONE) 25 MG tablet TAKE 0.5 TABLETS (12.5 MG TOTAL) BY MOUTH AT BEDTIME. 45 tablet 1  . sucralfate (CARAFATE) 1 g tablet Take 1 tablet (1 g total) by mouth 4 (four) times daily -  with meals and at bedtime. 120 tablet 0  . tamsulosin (FLOMAX) 0.4 MG CAPS capsule Take 2 capsules (0.8 mg total) by mouth daily after supper. 60 capsule 0  . torsemide (DEMADEX) 20 MG tablet Take 2 tablets (40 mg total) by mouth 2 (two) times daily. 360 tablet 3   No current facility-administered medications for this encounter.     Allergies:   Patient has no known allergies.   Social History:  The patient  reports that he quit smoking about 43 years ago. His smoking use included cigarettes. He started smoking about 60 years ago. He has a 12.75 pack-year smoking history. He has never used smokeless tobacco. He reports that he does not drink alcohol or use drugs.   Family History:  The patient's family history includes Heart attack (age of onset: 75) in his  father; Heart disease in his father; Hypertension in his father and mother.   ROS:  Please see the history of present illness.   All other systems are personally reviewed and negative.   Exam:  (Video/Tele Health Call; Exam is subjective and or/visual.) General:  Speaks in full sentences. No resp difficulty. Lungs: Normal respiratory effort with conversation.  Abdomen: Non-distended per patient report Extremities: Pt endorses occasional edema. But none today  Neuro: Alert & oriented x 3.   Recent Labs: 01/06/2019: TSH 0.906 02/06/2019: Magnesium 2.2 08/21/2019: ALT 27; B Natriuretic Peptide 2,515.0; BUN 42; Creatinine, Ser 2.00; Hemoglobin 11.8; Platelets 177; Potassium 5.0; Sodium 135  Personally reviewed   Wt Readings from Last 3 Encounters:  08/21/19 80.7 kg (178 lb)  02/14/19 81.2 kg (179 lb)  02/14/19 81.2 kg (179 lb)      ASSESSMENT AND PLAN:  1. Chronic Systolic Heart  Failure ->recent cardiogenic shock: Due to primarily to nonischemic cardiomyopathy (out of proportion to CAD). [place] 2015 with moderate disease. Has Biotronik BiV ICD. Initial CO-OX 38% so milrinone started on 1/28.  -ECHO 01/01/19 EF 10% with severe MR/TR, biatrial enlargement, RV with moderately reduced function.  - Improved with inotropic support. Stable NYHA III  - Continue milrinone 0.25 mcg/kg/min. Managed through [facility]. Labs every Friday with [facility].  - Volume status recently elevated after running out of his weekly metolazone but now back to baseline after restarting. He is stable on torsemide 40/30 and metolazone every Friday. Creatinine recently up to 2.5 so spiro stopped. Recheck planned for tomorrow.  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 However he continues to say he is feeling better but not ready for VAD yet.   - Repeat echo in next 1-2 months  2. H/o RP bleed with symptomatic anemia - Was on [facility] for h/o LV clot  (no longer present) - Off AC currently. HGb stable. Will not restart   3. CKD Stage III, Previously creatinine baseline 1.8-2.4.  - Continue labs with [facility]. Recent creatinine improved 2.31 -> 1.97. -> 2.0 -> 2.5 (08/29/19) - Spiro stopped. Has repeat tomorrow  4. H/o Recurrent Klebsiella UTI - treatment per PCP  5. Driving restrictions - HF remains stable on milrinone.  - No recent events with ICD - No clear indication to restrict driving from my standpoint.   No s/s of COVID  infection. Time spent 14 mins discussing above.   Signed, Glori Bickers, MD  09/10/2019 1:03 PM  [place] [address] Heart and [place] [address] [phone] (office) [phone] (fax)

## 2019-09-11 NOTE — Addendum Note (Signed)
Encounter addended by: Marlise Eves, RN on: 09/11/2019 4:36 PM  Actions taken: Clinical Note Signed

## 2019-09-11 NOTE — Progress Notes (Signed)
No new orders. F/u sent to scheduler.

## 2019-09-16 ENCOUNTER — Ambulatory Visit
Admission: RE | Admit: 2019-09-16 | Discharge: 2019-09-16 | Disposition: A | Discharge status: Home / Self Care | Payer: Medicare Other | Source: Ambulatory Visit | Attending: Interventional Radiology | Admitting: Interventional Radiology

## 2019-09-16 ENCOUNTER — Other Ambulatory Visit: Payer: Self-pay

## 2019-09-16 ENCOUNTER — Encounter: Payer: Self-pay | Admitting: *Deleted

## 2019-09-16 HISTORY — PX: IR RADIOLOGIST EVAL & MGMT: IMG5224

## 2019-09-16 NOTE — Progress Notes (Signed)
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  History of Present Illness: Spencer Rice is a 78 y.o. 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  He did sustain a large spontaneous left retroperitoneal hemorrhage in February of this year.  Since CT had not been performed since March, it was decided to perform a follow-up CT to assess for resolution of hemorrhage and check the ablation site one more time prior to probable cessation of routine post ablation imaging.  He recently followed up with Dr. Haroldine Laws for management of his heart failure.  Most recent creatinine was 2.0 with estimated GFR of 36 mL/min.  A follow-up CT of the abdomen and pelvis was performed without contrast on 09/08/2019.  He has no complaints of abdominal or pelvic pain.  He recently was treated with antibiotics for a bladder infection and completed antibiotics about 1 week ago.  Past Medical History:  Diagnosis Date  . AICD (automatic cardioverter/defibrillator) present   . Cancer of left kidney ([place])    S/P OR 05/2014  . CHF (congestive heart failure) ([place])   . CKD (chronic kidney disease)   . 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 Coronary artery disease   . DVT (deep venous thrombosis) ([facility]) 1983   LLE  . GERD (gastroesophageal reflux disease)   . History of hiatal hernia   . Hypertension     Past Surgical History:  Procedure Laterality Date  . CARDIAC CATHETERIZATION  1980's  . IMPLANTABLE CARDIOVERTER DEFIBRILLATOR IMPLANT  07/21/2010  . IR FLUORO GUIDE CV LINE RIGHT  01/23/2019  . IR GENERIC HISTORICAL  12/23/2014   IR RADIOLOGIST EVAL & MGMT 12/23/2014 [name], MD [facility] INTERV RAD  . IR GENERIC HISTORICAL  07/20/2016   IR RADIOLOGIST EVAL & MGMT 07/20/2016 [facility] INTERV RAD  .  IR RADIOLOGIST EVAL & MGMT  07/04/2017  . IR RADIOLOGIST EVAL & MGMT  07/08/2019  . IR US GUIDE VASC ACCESS RIGHT  01/23/2019  . RENAL CRYOABLATION Left 05/2014  . RIGHT/LEFT HEART CATH AND CORONARY ANGIOGRAPHY N/A 01/07/2019   Procedure: RIGHT/LEFT HEART CATH AND CORONARY ANGIOGRAPHY;  Surgeon: Jolaine Artist, MD;  Location: [facility];  Service: Cardiovascular;  Laterality: N/A;    Allergies: Patient has no known allergies.  Medications: Prior to Admission medications   Medication Sig Start Date End Date Taking? Authorizing Provider  acetaminophen (TYLENOL) 325 MG tablet Take 2 tablets (650 mg total) by mouth every 4 (four) hours as needed for headache or mild pain. Patient taking differently: Take 650 mg by mouth every 6 (six) hours as needed for mild pain or headache.  01/29/19   Shirley Friar, PA-C  amiodarone (PACERONE) 200 MG tablet TAKE 1 TABLET BY MOUTH EVERY DAY 09/08/19   Bensimhon, Shaune Pascal, MD  benzonatate (TESSALON) 100 MG capsule Take 1 capsule (100 mg total) by mouth every 8 (eight) hours. 02/14/19   Hennie Duos, MD  lactobacillus (FLORANEX/LACTINEX) PACK Take 1 packet (1 g total) by mouth 3 (three) times daily with meals. 02/14/19   Hennie Duos, MD  metolazone (ZAROXOLYN) 2.5 MG tablet Take 1 tablet (2.5 mg total) by mouth once a week. Every Friday. 06/16/19 09/14/19  Bensimhon, Shaune Pascal, MD  milrinone (PRIMACOR) 20 MG/100 ML SOLN infusion Inject 0.0205 mg/min into the vein  continuous. 02/14/19   Hennie Duos, MD  milrinone [facility]) 20 MG/100 ML SOLN infusion Inject into the vein. 02/14/19   [provider]  mometasone-formoterol (DULERA) 200-5 MCG/ACT AERO Inhale 2 puffs into the lungs 2 (two) times daily. 02/14/19   Hennie Duos, MD  OXYGEN Inhale 2 L into the lungs continuous.    [provider]  pantoprazole (PROTONIX) 40 MG tablet Take 1 tablet (40 mg total) by mouth daily. 08/23/19   Strader, Fransisco Hertz, PA-C   polyethylene glycol (MIRALAX / GLYCOLAX) packet Take 17 g by mouth daily. 02/14/19   Hennie Duos, MD  potassium chloride SA (KLOR-CON M20) 20 MEQ tablet Take 2 tablets (40 mEq total) by mouth daily. 08/29/19   Bensimhon, Shaune Pascal, MD  spironolactone (ALDACTONE) 25 MG tablet TAKE 0.5 TABLETS (12.5 MG TOTAL) BY MOUTH AT BEDTIME. 02/18/19   Bensimhon, Shaune Pascal, MD  sucralfate (CARAFATE) 1 g tablet Take 1 tablet (1 g total) by mouth 4 (four) times daily -  with meals and at bedtime. 02/14/19   Hennie Duos, MD  tamsulosin (FLOMAX) 0.4 MG CAPS capsule Take 2 capsules (0.8 mg total) by mouth daily after supper. 02/14/19   Hennie Duos, MD  torsemide (DEMADEX) 20 MG tablet Take 2 tablets (40 mg total) by mouth 2 (two) times daily. 08/22/19   Darrick Grinder D, NP     Family History  Problem Relation Age of Onset  . Heart disease Father   . Heart attack Father 73  . Hypertension Father   . Hypertension Mother     Social History   Socioeconomic History  . Marital status: Married    Spouse name: Not on file  . Number of children: Not on file  . Years of education: Not on file  . Highest education level: Not on file  Occupational History  . Not on file  Social Needs  . Financial resource strain: Not on file  . Food insecurity    Worry: Not on file    Inability: Not on file  . Transportation needs    Medical: Not on file    Non-medical: Not on file  Tobacco Use  . Smoking status: Former Smoker    Packs/day: 0.75    Years: 17.00    Pack years: 12.75    Types: Cigarettes    Start date: 05/08/1959    Quit date: 06/20/1976    Years since quitting: 43.2  . Smokeless tobacco: Never Used  Substance and Sexual Activity  . Alcohol use: No  . Drug use: No  . Sexual activity: Not on file  Lifestyle  . Physical activity    Days per week: Not on file    Minutes per session: Not on file  . Stress: Not on file  Relationships  . Social Herbalist on phone: Not on file    Gets  together: Not on file    Attends religious service: Not on file    Active member of club or organization: Not on file    Attends meetings of clubs or organizations: Not on file    Relationship status: Not on file  Other Topics Concern  . Not on file  Social History Narrative  . Not on file    Review of Systems  Constitutional: Negative.   Respiratory: Negative.   Cardiovascular: Negative.   Gastrointestinal: Negative.   Genitourinary: Negative.   Musculoskeletal: Negative.   Neurological: Negative.     Review  of Systems: A 12 point ROS discussed and pertinent positives are indicated in the HPI above.  All other systems are negative.  Physical Exam No direct physical exam was performed (except for noted visual exam findings with Video Visits).   Vital Signs: There were no vitals taken for this visit.  Imaging: Dg Chest Port 1 View  Result Date: 08/21/2019 CLINICAL DATA:  78 year old male with increasing shortness of breath. EXAM: PORTABLE CHEST 1 VIEW COMPARISON:  02/06/2019 portable chest and earlier. FINDINGS: Portable AP upright view at 2018 hours. Stable cardiomegaly and mediastinal contours. Stable tunneled right chest catheter and left chest AICD. Stable lung volumes and ventilation. No overt pulmonary edema. No pneumothorax, pleural effusion or consolidation. Visualized tracheal air column is within normal limits. No acute osseous abnormality identified. IMPRESSION: Stable severe cardiomegaly.  No acute cardiopulmonary abnormality. Electronically Signed   By: Genevie Ann M.D.   On: 08/21/2019 20:25    Labs:  CBC: Recent Labs    02/05/19 02/06/19 1850 02/11/19 2141 08/21/19 2032  WBC 5.2  5.2 7.3 5.8 6.1  HGB 11.4* 12.5* 11.6* 11.8*  HCT 35* 38.6* 35.5* 36.9*  PLT 166 167 126* 177    COAGS: Recent Labs    01/06/19 1650  01/22/19 0435 01/23/19 0300 02/06/19 1850 02/11/19 2141  INR  --    < > 1.32 1.33 1.3* 1.3*  APTT 33  --   --   --  28  --    < > = values  in this interval not displayed.    BMP: Recent Labs    02/06/19 1850 02/11/19 2141 02/14/19 1422 08/21/19 2032  NA 132* 132* 135 135  K 4.9 4.0 3.9 5.0  CL 102 97* 101 100  CO2 25 24 23 23   GLUCOSE 125* 128* 113* 123*  BUN 39* 30* 25* 42*  CALCIUM 8.8* 8.9 9.0 9.3  CREATININE 1.66* 1.55* 1.25* 2.00*  GFRNONAA 39* 43* 55* 31*  GFRAA 45* 49* >60 36*    LIVER FUNCTION TESTS: Recent Labs    02/06/19 1850 02/11/19 2141 02/14/19 1422 08/21/19 2032  BILITOT 2.4* 2.6* 1.7* 0.9  AST 46* 32 31 44*  ALT 18 14 14 27   ALKPHOS 118 112 100 77  PROT 7.8 7.2 7.2 7.5  ALBUMIN 3.0* 2.9* 2.9* 3.8    Assessment and Plan:  I spoke with Mr. Westman by phone and reviewed findings of the recent CT with him.  The posterior left renal ablation defect appears stable without evidence of visible tumor recurrence.  There is no evidence of metastatic disease in the abdomen or pelvis.  No obvious new renal lesions are identified on the unenhanced scan.  Left posterior retroperitoneal hemorrhage has completely resolved with some minimal stranding remaining in the retroperitoneal fat.  I told Mr. Gadson that no further routine imaging follow-up will be necessary given that there is no evidence of tumor recurrence more than 5 years after ablation of the left renal carcinoma.   Electronically Signed: Azzie Roup 09/16/2019, 2:46 PM     I spent a total of 10 Minutes in remote  clinical consultation, greater than 50% of which was counseling/coordinating care post ablation of a left renal carcinoma.    Visit type: Audio only (telephone). Audio (no video) only due to patient's lack of internet/smartphone capability. Alternative for in-person consultation at [facility], [address] [place], [place], [place]. This visit type was conducted due to national recommendations for restrictions regarding the COVID-19 Pandemic (e.g. social distancing).  This format is felt to be most appropriate  for this patient at this time.  All issues noted in this document were discussed and addressed.

## 2019-09-23 ENCOUNTER — Other Ambulatory Visit (HOSPITAL_COMMUNITY): Payer: Self-pay | Admitting: Internal Medicine

## 2019-09-23 ENCOUNTER — Telehealth (HOSPITAL_COMMUNITY): Payer: Self-pay | Admitting: Cardiology

## 2019-09-23 MED ORDER — POTASSIUM CHLORIDE CRYS ER 20 MEQ PO TBCR: 40.0000 meq | EXTENDED_RELEASE_TABLET | Freq: Two times a day (BID) | ORAL | 3 refills | Status: AC

## 2019-09-23 NOTE — Telephone Encounter (Signed)
Abnormal labs received from [facility] Cr 2.43 BUN 74 Na 138 K 3.4   Per  Brittiany Simmons,PA Increase potassium to 40 MEQ bid and repeat labs in one week  Patient aware and voiced understanding [place] to repeat labs Patient states ok to leave detailed message with instructions on VM in the future.

## 2019-09-23 NOTE — Addendum Note (Signed)
Addended by: Lauralye Kinn, Sharlot Gowda on: 09/23/2019 11:52 AM   Modules accepted: Orders

## 2019-09-25 ENCOUNTER — Other Ambulatory Visit: Payer: Self-pay

## 2019-09-25 ENCOUNTER — Emergency Department (HOSPITAL_BASED_OUTPATIENT_CLINIC_OR_DEPARTMENT_OTHER)
Admission: EM | Admit: 2019-09-25 | Discharge: 2019-09-25 | Discharge status: Against Medical Advice | Payer: Medicare Other | Attending: Emergency Medicine | Admitting: Emergency Medicine

## 2019-09-25 ENCOUNTER — Encounter (HOSPITAL_BASED_OUTPATIENT_CLINIC_OR_DEPARTMENT_OTHER): Payer: Self-pay

## 2019-09-25 ENCOUNTER — Emergency Department (HOSPITAL_BASED_OUTPATIENT_CLINIC_OR_DEPARTMENT_OTHER): Payer: Medicare Other

## 2019-09-25 DIAGNOSIS — R635 Abnormal weight gain: Secondary | ICD-10-CM | POA: Insufficient documentation

## 2019-09-25 DIAGNOSIS — Z95810 Presence of automatic (implantable) cardiac defibrillator: Secondary | ICD-10-CM | POA: Insufficient documentation

## 2019-09-25 DIAGNOSIS — R0601 Orthopnea: Secondary | ICD-10-CM | POA: Insufficient documentation

## 2019-09-25 DIAGNOSIS — J449 Chronic obstructive pulmonary disease, unspecified: Secondary | ICD-10-CM | POA: Insufficient documentation

## 2019-09-25 DIAGNOSIS — I2510 Atherosclerotic heart disease of native coronary artery without angina pectoris: Secondary | ICD-10-CM | POA: Insufficient documentation

## 2019-09-25 DIAGNOSIS — Z87891 Personal history of nicotine dependence: Secondary | ICD-10-CM | POA: Diagnosis not present

## 2019-09-25 DIAGNOSIS — Z86718 Personal history of other venous thrombosis and embolism: Secondary | ICD-10-CM | POA: Insufficient documentation

## 2019-09-25 DIAGNOSIS — R3912 Poor urinary stream: Secondary | ICD-10-CM | POA: Diagnosis not present

## 2019-09-25 DIAGNOSIS — R339 Retention of urine, unspecified: Secondary | ICD-10-CM | POA: Diagnosis present

## 2019-09-25 DIAGNOSIS — N183 Chronic kidney disease, stage 3 unspecified: Secondary | ICD-10-CM | POA: Diagnosis not present

## 2019-09-25 DIAGNOSIS — I130 Hypertensive heart and chronic kidney disease with heart failure and stage 1 through stage 4 chronic kidney disease, or unspecified chronic kidney disease: Secondary | ICD-10-CM | POA: Insufficient documentation

## 2019-09-25 DIAGNOSIS — I5022 Chronic systolic (congestive) heart failure: Secondary | ICD-10-CM | POA: Insufficient documentation

## 2019-09-25 DIAGNOSIS — Z79899 Other long term (current) drug therapy: Secondary | ICD-10-CM | POA: Diagnosis not present

## 2019-09-25 DIAGNOSIS — R34 Anuria and oliguria: Secondary | ICD-10-CM

## 2019-09-25 LAB — URINALYSIS, ROUTINE W REFLEX MICROSCOPIC
Bilirubin Urine: NEGATIVE
Glucose, UA: NEGATIVE mg/dL
Hgb urine dipstick: NEGATIVE
Ketones, ur: NEGATIVE mg/dL
Nitrite: NEGATIVE
Protein, ur: NEGATIVE mg/dL
Specific Gravity, Urine: 1.015 (ref 1.005–1.030)
pH: 5.5 (ref 5.0–8.0)

## 2019-09-25 LAB — URINALYSIS, MICROSCOPIC (REFLEX): RBC / HPF: NONE SEEN RBC/hpf (ref 0–5)

## 2019-09-25 NOTE — ED Provider Notes (Signed)
[facility] EMERGENCY DEPARTMENT Provider Note   CSN: [id number] Arrival date & time: 09/25/19  1314     History   Chief Complaint Chief Complaint  Patient presents with  . Urinary Retention    HPI Spencer Rice is a 78 y.o. male.     78yo M w/ PMH including AICD, CHF, COPD on home O2, CKD, CAD who p/w urinary problems. Pt reports that for the past several days, he has been having increased urinary frequency, burning with urination, and only urinating small amounts at a time. He reports that he has had orthopnea, increased LE edema, 6lb weight gain, and increased DOE over the past week. He denies any chest pain. Occasional cough but no persistent cough or fevers. Reports compliance with medications.   Chart review shows that patient was recently started on cipro for a + urine culture and his potassium supplementation was increased.  The history is provided by the patient and medical records.    Past Medical History:  Diagnosis Date  . AICD (automatic cardioverter/defibrillator) present   . Cancer of left kidney ([place])    S/P OR 05/2014  . CHF (congestive heart failure) ([place])   . CKD (chronic kidney disease)   . 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 Coronary artery disease   . DVT (deep venous thrombosis) ([facility]) 1983   LLE  . GERD (gastroesophageal reflux disease)   . History of hiatal hernia   . Hypertension     Patient Active Problem List   Diagnosis Date Noted  . Chronic systolic (congestive) heart failure ([place]) 02/08/2019  . Cardiogenic shock ([place]) 02/01/2019  . Volume overload 02/01/2019  . Retroperitoneal bleed 02/01/2019  . Urinary tract infection due to Klebsiella species 02/01/2019  . Weakness generalized   . Tachypnea   . Sinus tachycardia   . Hyponatremia   . Acute blood loss anemia   . DNR (do not resuscitate) discussion   . Palliative care by specialist   . Malnutrition of moderate degree 01/06/2019   . CKD (chronic kidney disease) stage 3, GFR 30-59 ml/min 12/30/2018  . PVCs (premature ventricular contractions) 12/30/2018  . Acute on chronic systolic congestive heart failure ([place]) 12/29/2018  . CHF (congestive heart failure) ([place]) 05/03/2018  . Acute on chronic systolic (congestive) heart failure ([place]) 05/03/2018  . Glucose intolerance (impaired glucose tolerance) 06/23/2016  . Vitreous floaters 12/08/2015  . TIA (transient ischemic attack) 12/08/2015  . SOB (shortness of breath) 12/08/2015  . Posterior vitreous detachment of both eyes 12/08/2015  . Pain due to dental caries 12/08/2015  . Other abnormal glucose 12/08/2015  . Osteoarthrosis 12/08/2015  . Long term (current) use of anticoagulants 12/08/2015  . Kidney mass 12/08/2015  . Hypersomnolence 12/08/2015  . Hyperopia with presbyopia of both eyes 12/08/2015  . Hyperlipidemia 12/08/2015  . History of orthopnea 12/08/2015  . COPD (chronic obstructive pulmonary disease) ([place]) 12/08/2015  . 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 AKI (acute kidney injury) ([place]) 12/08/2015  . Abnormal CT scan, kidney 12/08/2015  . Abdominal bloating 12/08/2015  . Biventricular ICD (implantable cardioverter-defibrillator) in place 12/07/2015  . Chest pain at rest 11/29/2015  . Essential hypertension 11/29/2015  . Acute on chronic systolic CHF (congestive heart failure), NYHA class 1 ([place]) 11/29/2015  . Gastroesophageal reflux disease without esophagitis 11/29/2015  . Pain in the chest   . Renal cancer ([place]) 08/16/2015  . 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[place])   . Cancer of  kidney ([place]) 01/19/2015  . Hematuria 09/04/2014  . Psychosexual dysfunction with inhibited sexual excitement 07/29/2014  . Pelvic pain in male 07/29/2014  . Increased urinary frequency 07/29/2014  . Heartburn 07/29/2014  . EKG, abnormal 07/29/2014  . Corneal scar 07/29/2014  . 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 Cardiomyopathy ([place]) 07/29/2014  .  CAD (coronary artery disease) 07/29/2014  . Disorder of kidney and ureter 04/09/2014    Past Surgical History:  Procedure Laterality Date  . CARDIAC CATHETERIZATION  1980's  . IMPLANTABLE CARDIOVERTER DEFIBRILLATOR IMPLANT  07/21/2010  . IR FLUORO GUIDE CV LINE RIGHT  01/23/2019  . IR GENERIC HISTORICAL  12/23/2014   IR RADIOLOGIST EVAL & MGMT 12/23/2014 [name], MD [facility] INTERV RAD  . IR GENERIC HISTORICAL  07/20/2016   IR RADIOLOGIST EVAL & MGMT 07/20/2016 [facility] INTERV RAD  . IR RADIOLOGIST EVAL & MGMT  07/04/2017  . IR RADIOLOGIST EVAL & MGMT  07/08/2019  . IR RADIOLOGIST EVAL & MGMT  09/16/2019  . IR US GUIDE VASC ACCESS RIGHT  01/23/2019  . RENAL CRYOABLATION Left 05/2014  . RIGHT/LEFT HEART CATH AND CORONARY ANGIOGRAPHY N/A 01/07/2019   Procedure: RIGHT/LEFT HEART CATH AND CORONARY ANGIOGRAPHY;  Surgeon: Jolaine Artist, MD;  Location: [facility];  Service: Cardiovascular;  Laterality: N/A;        Home Medications    Prior to Admission medications   Medication Sig Start Date End Date Taking? Authorizing Provider  acetaminophen (TYLENOL) 325 MG tablet Take 2 tablets (650 mg total) by mouth every 4 (four) hours as needed for headache or mild pain. Patient taking differently: Take 650 mg by mouth every 6 (six) hours as needed for mild pain or headache.  01/29/19   Shirley Friar, PA-C  amiodarone (PACERONE) 200 MG tablet TAKE 1 TABLET BY MOUTH EVERY DAY 09/08/19   Bensimhon, Shaune Pascal, MD  benzonatate (TESSALON) 100 MG capsule Take 1 capsule (100 mg total) by mouth every 8 (eight) hours. 02/14/19   Hennie Duos, MD  lactobacillus (FLORANEX/LACTINEX) PACK Take 1 packet (1 g total) by mouth 3 (three) times daily with meals. 02/14/19   Hennie Duos, MD  metolazone (ZAROXOLYN) 2.5 MG tablet Take 1 tablet (2.5 mg total) by mouth once a week. Every Friday. 06/16/19 09/14/19  Bensimhon, Shaune Pascal, MD  milrinone (PRIMACOR) 20 MG/100 ML SOLN infusion Inject 0.0205  mg/min into the vein continuous. 02/14/19   Hennie Duos, MD  milrinone [facility]) 20 MG/100 ML SOLN infusion Inject into the vein. 02/14/19   [provider]  mometasone-formoterol (DULERA) 200-5 MCG/ACT AERO Inhale 2 puffs into the lungs 2 (two) times daily. 02/14/19   Hennie Duos, MD  OXYGEN Inhale 2 L into the lungs continuous.    [provider]  pantoprazole (PROTONIX) 40 MG tablet Take 1 tablet (40 mg total) by mouth daily. 08/23/19   Strader, Fransisco Hertz, PA-C  polyethylene glycol (MIRALAX / GLYCOLAX) packet Take 17 g by mouth daily. 02/14/19   Hennie Duos, MD  potassium chloride SA (KLOR-CON M20) 20 MEQ tablet Take 2 tablets (40 mEq total) by mouth 2 (two) times daily. 09/23/19   Bensimhon, Shaune Pascal, MD  spironolactone (ALDACTONE) 25 MG tablet TAKE 0.5 TABLETS (12.5 MG TOTAL) BY MOUTH AT BEDTIME. 02/18/19   Bensimhon, Shaune Pascal, MD  sucralfate (CARAFATE) 1 g tablet Take 1 tablet (1 g total) by mouth 4 (four) times daily -  with meals and at bedtime. 02/14/19  Hennie Duos, MD  tamsulosin (FLOMAX) 0.4 MG CAPS capsule Take 2 capsules (0.8 mg total) by mouth daily after supper. 02/14/19   Hennie Duos, MD  torsemide (DEMADEX) 20 MG tablet Take 2 tablets (40 mg total) by mouth 2 (two) times daily. 08/22/19   Conrad Delta, NP    Family History Family History  Problem Relation Age of Onset  . Heart disease Father   . Heart attack Father 33  . Hypertension Father   . Hypertension Mother     Social History Social History   Tobacco Use  . Smoking status: Former Smoker    Packs/day: 0.75    Years: 17.00    Pack years: 12.75    Types: Cigarettes    Start date: 05/08/1959    Quit date: 06/20/1976    Years since quitting: 43.2  . Smokeless tobacco: Never Used  Substance Use Topics  . Alcohol use: No  . Drug use: No     Allergies   Patient has no known allergies.   Review of Systems Review of Systems All other systems reviewed and are negative  except that which was mentioned in HPI   Physical Exam Updated Vital Signs BP 90/62 (BP Location: Left Arm)   Pulse 77   Temp 98.5 F (36.9 C) (Oral)   Resp 20   SpO2 100%   Physical Exam Vitals signs and nursing note reviewed.  Constitutional:      General: He is not in acute distress.    Appearance: He is well-developed.  HENT:     Head: Normocephalic and atraumatic.  Eyes:     Conjunctiva/sclera: Conjunctivae normal.  Neck:     Musculoskeletal: Neck supple.     Vascular: JVD present.  Cardiovascular:     Rate and Rhythm: Normal rate and regular rhythm.     Heart sounds: Murmur present.  Pulmonary:     Effort: Pulmonary effort is normal.     Comments: Diminished breath sounds b/l Abdominal:     General: Bowel sounds are normal. There is no distension.     Palpations: Abdomen is soft.     Tenderness: There is no abdominal tenderness.  Musculoskeletal:     Right lower leg: Edema present.     Left lower leg: Edema present.  Skin:    General: Skin is warm and dry.  Neurological:     Mental Status: He is alert and oriented to person, place, and time.     Comments: Fluent speech  Psychiatric:        Judgment: Judgment normal.      ED Treatments / Results  Labs (all labs ordered are listed, but only abnormal results are displayed) Labs Reviewed  URINALYSIS, ROUTINE W REFLEX MICROSCOPIC  BASIC METABOLIC PANEL  CBC WITH DIFFERENTIAL/PLATELET  BRAIN NATRIURETIC PEPTIDE    EKG None  Radiology Dg Chest 2 View  Result Date: 09/25/2019 CLINICAL DATA:  The patient is unable to urinate today. EXAM: CHEST - 2 VIEW COMPARISON:  CT chest 01/06/2019. Single-view of the chest 08/21/2019. FINDINGS: Right IJ catheter and pacing device/AICD are unchanged. Lungs are clear. There is marked cardiomegaly. Atherosclerosis noted. IMPRESSION: No acute disease. Marked cardiomegaly. Atherosclerosis. Electronically Signed   By: Inge Rise M.D.   On: 09/25/2019 14:42     Procedures Procedures (including critical care time) EMERGENCY DEPARTMENT ULTRASOUND  Study: Limited Ultrasound of Bladder  INDICATIONS: to assess for urinary retention and/or bladder volume prior to urinary catheter Multiple views of the  bladder were obtained in real-time in the transverse and longitudinal planes with a multi-frequency probe.  PERFORMED BY: Myself IMAGES ARCHIVED?: Yes LIMITATIONS:  none INTERPRETATION: Moderate Volume ~150 cc urinary retention       Medications Ordered in ED Medications - No data to display   Initial Impression / Assessment and Plan / ED Course  I have reviewed the triage vital signs and the nursing notes.  Pertinent labs & imaging results that were available during my care of the patient were reviewed by me and considered in my medical decision making (see chart for details).        Stable on home O2 level on arrival. Exam and history suggest volume overload. Bedside US shows only 150cc urine in bladder.  I have ordered lab work including BNP to evaluate for evidence of volume overload or worsening renal failure.  CXR w/ cardiomegaly but otherwise clear. I am signing out to oncoming provider pending completion of w/u.   Final Clinical Impressions(s) / ED Diagnoses   Final diagnoses:  None    ED Discharge Orders    None       Little, Wenda Overland, MD 09/25/19 1506

## 2019-09-25 NOTE — ED Provider Notes (Signed)
Assumed care at change of shift.  H&P detailed by Dr. Rex Kras.  In brief he is presenting with both urinary and respiratory symptoms.  Suspect 2 different processes.  He probably has a UTI.  UA today is not overly impressive but he was discharged on antibiotics.  He is also complaining of increasing edema, weight gain and orthopnea.  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  He reports compliance with all his medications.  On exam he does not appear to be distressed.  Chest x-ray without overt edema.  O2 sats are normal.  Unfortunately, his labs hemolyzed.  Patient is unwilling to have them redrawn.  I would be particularly interested to see his renal function.  I discussed with him my concerns, but he continues to decline further attempts for redraw.  He will be discharged per his request.  Advised to continue his antibiotics as prescribed.  Advised him to call his cardiologist on Monday.  Emergent ER return precautions in the interim detailed.   Virgel Manifold, MD 09/25/19 1623

## 2019-09-25 NOTE — ED Notes (Signed)
Pt refuses further tx; EDP has discussed risks of this with pt and pt verbalizes understanding.

## 2019-09-25 NOTE — ED Notes (Signed)
Pt refused redraw of blood.

## 2019-09-25 NOTE — ED Triage Notes (Signed)
Pt to triage in w/c with home O2-c/o urinary retention-last urinated 10/18-"drops" daily since then-NAD

## 2019-09-25 NOTE — Discharge Instructions (Signed)
Continue take your antibiotics as previously prescribed.  Continue weigh her self daily and take your other medications.  I would like you to call her cardiologist on Monday.  Return to the emergency room in the meantime if you feel like your symptoms are significantly worsening.

## 2019-09-26 ENCOUNTER — Telehealth (HOSPITAL_COMMUNITY): Payer: Self-pay | Admitting: *Deleted

## 2019-09-26 LAB — URINE CULTURE

## 2019-09-26 NOTE — Telephone Encounter (Signed)
Pt's HHRN called to report pt's wt is up 6 lbs since last Fri (168-->174) she states pt does c/o increased SOB when he lays flat, slight ankle edema and abd distention.  She reports he has been having trouble urinating and they did a UA on Wed which was + for UTI and he was started on Cipro.  Since then he feels like he is starting to urinated more frequently.  Today is his day to take Metolazone so he will do this, if symptoms and wt not improving he will call us or HH.

## 2019-09-29 ENCOUNTER — Telehealth (HOSPITAL_COMMUNITY): Payer: Self-pay | Admitting: *Deleted

## 2019-09-29 ENCOUNTER — Other Ambulatory Visit: Payer: Self-pay

## 2019-09-29 ENCOUNTER — Ambulatory Visit (HOSPITAL_COMMUNITY)
Admission: RE | Admit: 2019-09-29 | Discharge: 2019-09-29 | Disposition: A | Discharge status: Home / Self Care | Payer: Medicare Other | Source: Ambulatory Visit | Attending: Internal Medicine | Admitting: Internal Medicine

## 2019-09-29 DIAGNOSIS — N1832 Chronic kidney disease, stage 3b: Secondary | ICD-10-CM

## 2019-09-29 DIAGNOSIS — I5022 Chronic systolic (congestive) heart failure: Secondary | ICD-10-CM

## 2019-09-29 NOTE — Telephone Encounter (Signed)
Sure I will place him on the schedule and check him in. I will let him know you will call this afternoon.   Thank you

## 2019-09-29 NOTE — Progress Notes (Signed)
Heart Failure TeleHealth Note  Due to national recommendations of social distancing due to [place] 19, Audio/video telehealth visit is felt to be most appropriate for this patient at this time.  See MyChart message from today for patient consent regarding telehealth for [facility].  Date:  09/29/2019   ID:  Spencer Rice, DOB 1941/09/13, MRN [id number]  Location: Home  Provider location: [place] [facility] Type of Visit: Established patient  PCP:  [name]., MD  Primary HF: Avelyn Touch  Chief Complaint: HF follow-up   History of Present Illness:  Spencer Rice is a 78 y.o. 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    . Admitted 1/26-2/26/2020 with cardiogenic shock.PICC placed 1/27 with initial CVP 26 coox 38% -> Milrinone started with improved diuresis and renal function. Echo 01/01/19 EF 10% with severe MR/TR, biatrial enlargement, RV with moderately reduced function. Taken for [facility] 01/07/19 with Mild, non-obstructive CAD, Moderate PAH with normal PAPI, and normal CO on milrinone 0.25 mcg/kg/min. PFTs 01/10/2001 FVC 2.28 (54%), FEV 2.0 (64%), DLCO 14.64 (53%). Discussed for LVADconsideration at [facility] ([place] and had planned to move forward,but patient had spontaneous RP bleed with symptomatic anemia complicating his admission.Discharged to SNF for rehab,   He presents via audio/video conferencing for a telehealth visit today. Remains on home milrinone. Presented to [facility] ED on 08/22/19 with worsening HF symptoms in setting of running out of his weekly metolazone. Found to be volume overloaded. Says he feels good but has some leg swelling during the day which goes down at night. Weight stable at 171 but HHRN measured his abdomen last week and he was  up from 98 cm to 106cm. Denies SOB, orthopnea or PND. Taking torsemide 40 bid + metolazone 1x/week on Friday.  He denies symptoms worrisome for COVID 19.   Past Medical History:  Diagnosis Date  . AICD (automatic cardioverter/defibrillator) present   . Cancer of left kidney ([place])    S/P OR 05/2014  . CHF (congestive heart failure) ([place])   . CKD (chronic kidney disease)   . 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 Coronary artery disease   . DVT (deep venous thrombosis) (HCC) 1983   LLE  . GERD (gastroesophageal reflux disease)   . History of hiatal hernia   . Hypertension    Past Surgical History:  Procedure Laterality Date  . CARDIAC CATHETERIZATION  1980's  . IMPLANTABLE CARDIOVERTER DEFIBRILLATOR IMPLANT  07/21/2010  . IR FLUORO GUIDE CV LINE RIGHT  01/23/2019  . IR GENERIC HISTORICAL  12/23/2014   IR RADIOLOGIST EVAL & MGMT 12/23/2014 [name], MD [facility] INTERV RAD  . IR GENERIC HISTORICAL  07/20/2016   IR RADIOLOGIST EVAL & MGMT 07/20/2016 [facility] INTERV RAD  . IR RADIOLOGIST EVAL & MGMT  07/04/2017  . IR RADIOLOGIST EVAL & MGMT  07/08/2019  . IR RADIOLOGIST EVAL & MGMT  09/16/2019  . IR US GUIDE VASC ACCESS RIGHT  01/23/2019  . RENAL CRYOABLATION Left 05/2014  . RIGHT/LEFT HEART CATH AND CORONARY ANGIOGRAPHY N/A 01/07/2019   Procedure: RIGHT/LEFT HEART CATH AND CORONARY ANGIOGRAPHY;  Surgeon: Jolaine Artist, MD;  Location: [facility];  Service: Cardiovascular;  Laterality: N/A;     Current Outpatient Medications  Medication  Sig Dispense Refill  . acetaminophen (TYLENOL) 325 MG tablet Take 2 tablets (650 mg total) by mouth every 4 (four) hours as needed for headache or mild pain. (Patient taking differently: Take 650 mg by mouth every 6 (six) hours as needed for mild pain or headache. )    . amiodarone (PACERONE) 200 MG tablet TAKE 1 TABLET BY MOUTH EVERY DAY 30 tablet 0  . benzonatate (TESSALON) 100 MG capsule Take 1 capsule (100 mg  total) by mouth every 8 (eight) hours. 30 capsule 0  . lactobacillus (FLORANEX/LACTINEX) PACK Take 1 packet (1 g total) by mouth 3 (three) times daily with meals. 90 packet 0  . metolazone (ZAROXOLYN) 2.5 MG tablet Take 1 tablet (2.5 mg total) by mouth once a week. Every Friday. 15 tablet 3  . milrinone (PRIMACOR) 20 MG/100 ML SOLN infusion Inject 0.0205 mg/min into the vein continuous. 100 mL 0  . milrinone (PRIMACOR) 20 MG/100 ML SOLN infusion Inject into the vein.    . mometasone-formoterol (DULERA) 200-5 MCG/ACT AERO Inhale 2 puffs into the lungs 2 (two) times daily. 8.8 g 0  . OXYGEN Inhale 2 L into the lungs continuous.    . pantoprazole (PROTONIX) 40 MG tablet Take 1 tablet (40 mg total) by mouth daily. 90 tablet 3  . polyethylene glycol (MIRALAX / GLYCOLAX) packet Take 17 g by mouth daily. 30 each 0  . potassium chloride SA (KLOR-CON M20) 20 MEQ tablet Take 2 tablets (40 mEq total) by mouth 2 (two) times daily. 120 tablet 3  . spironolactone (ALDACTONE) 25 MG tablet TAKE 0.5 TABLETS (12.5 MG TOTAL) BY MOUTH AT BEDTIME. 45 tablet 1  . sucralfate (CARAFATE) 1 g tablet Take 1 tablet (1 g total) by mouth 4 (four) times daily -  with meals and at bedtime. 120 tablet 0  . tamsulosin (FLOMAX) 0.4 MG CAPS capsule Take 2 capsules (0.8 mg total) by mouth daily after supper. 60 capsule 0  . torsemide (DEMADEX) 20 MG tablet Take 2 tablets (40 mg total) by mouth 2 (two) times daily. 360 tablet 3   No current facility-administered medications for this encounter.     Allergies:   Patient has no known allergies.   Social History:  The patient  reports that he quit smoking about 43 years ago. His smoking use included cigarettes. He started smoking about 60 years ago. He has a 12.75 pack-year smoking history. He has never used smokeless tobacco. He reports that he does not drink alcohol or use drugs.   Family History:  The patient's family history includes Heart attack (age of onset: 55) in his father;  Heart disease in his father; Hypertension in his father and mother.   ROS:  Please see the history of present illness.   All other systems are personally reviewed and negative.   Exam:  (Video/Tele Health Call; Exam is subjective and or/visual.) General:  Speaks in full sentences. No resp difficulty. Lungs: Normal respiratory effort with conversation.  Abdomen: Non-distended per patient report Extremities: Pt endorses mild edema Neuro: Alert & oriented x 3.   Recent Labs: 01/06/2019: TSH 0.906 02/06/2019: Magnesium 2.2 08/21/2019: ALT 27; B Natriuretic Peptide 2,515.0; BUN 42; Creatinine, Ser 2.00; Hemoglobin 11.8; Platelets 177; Potassium 5.0; Sodium 135  Personally reviewed   Wt Readings from Last 3 Encounters:  08/21/19 80.7 kg (178 lb)  02/14/19 81.2 kg (179 lb)  02/14/19 81.2 kg (179 lb)      ASSESSMENT AND PLAN:  1. Chronic Systolic Heart Failure ->recent  cardiogenic shock: Due to primarily to nonischemic cardiomyopathy (out of proportion to CAD). [place] 2015 with moderate disease. Has Biotronik BiV ICD. Initial CO-OX 38% so milrinone started on 1/28.  -ECHO 01/01/19 EF 10% with severe MR/TR, biatrial enlargement, RV with moderately reduced function.  - Stable NYHA III despite extra volume.  - Continue milrinone 0.25 mcg/kg/min. Managed through [facility]. Labs every Friday with [facility].  - Currently on torsemide 40 bid +metolazone every Friday. Volume seems up today but weight stable. Wil have him take a dose of metolazone today. - Creatinine recently up to 2.5 so spiro stopped. Recheck planned for  Friday with HHRN - No BB with inotrope dependence - Discussed at VAD MRB meeting in 2/20  and would be possible VAD candidate if he gets stronger. However he continues to say he is feeling better but not ready for VAD yet.   - Repeat echo in next 1-2 months  2. H/o RP bleed with symptomatic anemia - Was on [facility] for h/o LV clot (no longer present) - Off AC currently. HGb stable. Will not restart    3. CKD Stage III, Previously creatinine baseline 1.8-2.4.  - Continue labs with [facility]. Recent creatinine improved 2.31 -> 1.97. -> 2.0 -> 2.5-> 2.4 - Spiro stopped.  - Recheck labs this Friday  4. H/o Recurrent Klebsiella UTI - treatment per PCP  5. Driving restrictions - HF remains stable on milrinone.  - No recent events with ICD - No clear indication to restrict driving from my standpoint.   No s/s of COVID  infection. Time spent 13 mins discussing above.   Signed, Glori Bickers, MD  09/29/2019 2:18 PM  [place] [address] [facility] [place] [address] [phone] (office) [phone] (fax)

## 2019-09-29 NOTE — Telephone Encounter (Signed)
Dr. Clayborne Dana patient will forward

## 2019-09-29 NOTE — Telephone Encounter (Signed)
Can we arrange for televisit this afternoon?

## 2019-09-29 NOTE — Telephone Encounter (Signed)
Pt left VM stating he did not want to speak to a nurse. He was told to call Dr.McLean and that's who he wants to speak directly to.   Message routed to [place]

## 2019-10-02 ENCOUNTER — Other Ambulatory Visit (HOSPITAL_COMMUNITY): Payer: Self-pay | Admitting: Internal Medicine

## 2019-10-08 ENCOUNTER — Other Ambulatory Visit (HOSPITAL_COMMUNITY): Payer: Self-pay | Admitting: Internal Medicine

## 2019-10-10 ENCOUNTER — Other Ambulatory Visit (HOSPITAL_COMMUNITY): Payer: Self-pay | Admitting: Internal Medicine

## 2019-10-11 ENCOUNTER — Other Ambulatory Visit: Payer: Self-pay

## 2019-10-11 ENCOUNTER — Emergency Department (HOSPITAL_BASED_OUTPATIENT_CLINIC_OR_DEPARTMENT_OTHER): Payer: Medicare Other

## 2019-10-11 ENCOUNTER — Emergency Department (HOSPITAL_BASED_OUTPATIENT_CLINIC_OR_DEPARTMENT_OTHER)
Admission: EM | Admit: 2019-10-11 | Discharge: 2019-10-11 | Disposition: A | Discharge status: Home / Self Care | Payer: Medicare Other | Attending: Emergency Medicine | Admitting: Emergency Medicine

## 2019-10-11 ENCOUNTER — Encounter (HOSPITAL_BASED_OUTPATIENT_CLINIC_OR_DEPARTMENT_OTHER): Payer: Self-pay | Admitting: Emergency Medicine

## 2019-10-11 DIAGNOSIS — I509 Heart failure, unspecified: Secondary | ICD-10-CM | POA: Insufficient documentation

## 2019-10-11 DIAGNOSIS — N189 Chronic kidney disease, unspecified: Secondary | ICD-10-CM | POA: Diagnosis not present

## 2019-10-11 DIAGNOSIS — Z79899 Other long term (current) drug therapy: Secondary | ICD-10-CM | POA: Diagnosis not present

## 2019-10-11 DIAGNOSIS — Z7984 Long term (current) use of oral hypoglycemic drugs: Secondary | ICD-10-CM | POA: Diagnosis not present

## 2019-10-11 DIAGNOSIS — Z86718 Personal history of other venous thrombosis and embolism: Secondary | ICD-10-CM | POA: Insufficient documentation

## 2019-10-11 DIAGNOSIS — I130 Hypertensive heart and chronic kidney disease with heart failure and stage 1 through stage 4 chronic kidney disease, or unspecified chronic kidney disease: Secondary | ICD-10-CM | POA: Diagnosis not present

## 2019-10-11 DIAGNOSIS — I2510 Atherosclerotic heart disease of native coronary artery without angina pectoris: Secondary | ICD-10-CM | POA: Diagnosis not present

## 2019-10-11 DIAGNOSIS — R300 Dysuria: Secondary | ICD-10-CM | POA: Diagnosis not present

## 2019-10-11 DIAGNOSIS — Z85528 Personal history of other malignant neoplasm of kidney: Secondary | ICD-10-CM | POA: Insufficient documentation

## 2019-10-11 DIAGNOSIS — R339 Retention of urine, unspecified: Secondary | ICD-10-CM | POA: Diagnosis present

## 2019-10-11 DIAGNOSIS — Z87891 Personal history of nicotine dependence: Secondary | ICD-10-CM | POA: Diagnosis not present

## 2019-10-11 LAB — URINALYSIS, MICROSCOPIC (REFLEX)

## 2019-10-11 LAB — URINALYSIS, ROUTINE W REFLEX MICROSCOPIC
Bilirubin Urine: NEGATIVE
Glucose, UA: NEGATIVE mg/dL
Ketones, ur: NEGATIVE mg/dL
Leukocytes,Ua: NEGATIVE
Nitrite: NEGATIVE
Protein, ur: NEGATIVE mg/dL
Specific Gravity, Urine: 1.01 (ref 1.005–1.030)
pH: 6 (ref 5.0–8.0)

## 2019-10-11 MED ORDER — ALUM & MAG HYDROXIDE-SIMETH 200-200-20 MG/5ML PO SUSP
15.0000 mL | Freq: Once | ORAL | Status: AC
  Administered 2019-10-11: 15 mL via ORAL
  Filled 2019-10-11: qty 30

## 2019-10-11 NOTE — ED Triage Notes (Signed)
Pt states he is unable to urinate and is constipated.

## 2019-10-11 NOTE — ED Provider Notes (Signed)
[place] DEPT [facility] Provider Note: Georgena Spurling, MD, FACEP  CSN: [id number] MRN: [id number] ARRIVAL: 10/11/19 at [place]: [place]  Urinary Retention   HISTORY OF PRESENT ILLNESS  10/11/19 1:24 AM Spencer Rice is a 78 y.o. male with a history of BPH previously on tamsulosin.  He was seen by urology at [facility] on 10/09/2019 for worsening difficulty urinating and was switched to silodosin 8 mg daily.  He was recently treated with ciprofloxacin for a urinary tract infection with transient improvement in his symptoms; urine culture grew out multiple bacterial morphotypes with none predominant.  He presents now with frequent urination and voiding of small amounts for the past day or 2.  He also complains of moderate pain with urination.  He states his abdomen feels distended and he has constipated although he has had a recent bowel movement.  He also feels his shortness of breath is worsened as a result of these other symptoms.  Bedside bladder scan shows only about 150 mL of postvoid residual.  He denies chest pain or abdominal pain.  He has  continuous milrinone infusing through a catheter in his right upper chest and is on continuous oxygen 2LNC.   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   Coronary artery disease    DVT (deep venous thrombosis) (HCC) 1983   LLE   GERD (gastroesophageal reflux disease)    History of hiatal hernia    Hypertension     Past Surgical History:  Procedure Laterality Date   CARDIAC CATHETERIZATION  1980's   IMPLANTABLE CARDIOVERTER DEFIBRILLATOR IMPLANT  07/21/2010   IR FLUORO GUIDE CV LINE RIGHT  01/23/2019   IR GENERIC HISTORICAL  12/23/2014   IR RADIOLOGIST EVAL & MGMT 12/23/2014 [name], MD [facility] INTERV RAD   IR GENERIC HISTORICAL  07/20/2016   IR RADIOLOGIST EVAL & MGMT 07/20/2016 [facility] INTERV RAD   IR RADIOLOGIST EVAL & MGMT  07/04/2017   IR RADIOLOGIST EVAL & MGMT  07/08/2019   IR RADIOLOGIST EVAL & MGMT  09/16/2019   IR US GUIDE VASC ACCESS RIGHT  01/23/2019   RENAL CRYOABLATION Left 05/2014   RIGHT/LEFT HEART CATH AND CORONARY ANGIOGRAPHY N/A 01/07/2019   Procedure: RIGHT/LEFT HEART CATH AND CORONARY ANGIOGRAPHY;  Surgeon: Jolaine Artist, MD;  Location: [place] [facility];  Service: Cardiovascular;  Laterality: N/A;    Family History  Problem Relation Age of Onset   Heart disease Father    Heart attack Father 17   Hypertension Father    Hypertension Mother     Social History   Tobacco Use   Smoking status: Former Smoker    Packs/day: 0.75    Years: 17.00    Pack years: 12.75    Types: Cigarettes    Start date: 05/08/1959    Quit date: 06/20/1976    Years since quitting: 43.3   Smokeless tobacco: Never Used  Substance Use Topics   Alcohol use: No   Drug use: No    Prior to Admission medications   Medication Sig Start Date End Date Taking? Authorizing Provider  acetaminophen (TYLENOL) 325 MG tablet Take 2 tablets (650 mg total) by mouth every 4 (four) hours  as needed for headache or mild pain. Patient taking differently: Take 650 mg by mouth every 6 (six) hours as needed for mild pain or headache.  01/29/19   Shirley Friar, PA-C  amiodarone (PACERONE) 200 MG tablet TAKE 1 TABLET BY MOUTH EVERY DAY 10/02/19   Bensimhon, Shaune Pascal, MD  benzonatate (TESSALON) 100 MG capsule Take 1 capsule (100 mg total) by mouth every 8 (eight) hours. 02/14/19   Hennie Duos, MD  lactobacillus (FLORANEX/LACTINEX) PACK Take 1 packet (1 g total) by mouth 3 (three) times daily with meals. 02/14/19   Hennie Duos, MD  metolazone (ZAROXOLYN) 2.5 MG tablet Take 1 tablet (2.5 mg total) by mouth once a  week. Every Friday. 06/16/19 09/14/19  Bensimhon, Shaune Pascal, MD  milrinone (PRIMACOR) 20 MG/100 ML SOLN infusion Inject 0.0205 mg/min into the vein continuous. 02/14/19   Hennie Duos, MD  milrinone [facility]) 20 MG/100 ML SOLN infusion Inject into the vein. 02/14/19   [provider]  mometasone-formoterol (DULERA) 200-5 MCG/ACT AERO Inhale 2 puffs into the lungs 2 (two) times daily. 02/14/19   Hennie Duos, MD  OXYGEN Inhale 2 L into the lungs continuous.    [provider]  pantoprazole (PROTONIX) 40 MG tablet Take 1 tablet (40 mg total) by mouth daily. 08/23/19   Strader, Fransisco Hertz, PA-C  polyethylene glycol (MIRALAX / GLYCOLAX) packet Take 17 g by mouth daily. 02/14/19   Hennie Duos, MD  potassium chloride SA (KLOR-CON M20) 20 MEQ tablet Take 2 tablets (40 mEq total) by mouth 2 (two) times daily. 09/23/19   Bensimhon, Shaune Pascal, MD  spironolactone (ALDACTONE) 25 MG tablet TAKE 0.5 TABLETS (12.5 MG TOTAL) BY MOUTH AT BEDTIME. 02/18/19   Bensimhon, Shaune Pascal, MD  sucralfate (CARAFATE) 1 g tablet Take 1 tablet (1 g total) by mouth 4 (four) times daily -  with meals and at bedtime. 02/14/19   Hennie Duos, MD  tamsulosin (FLOMAX) 0.4 MG CAPS capsule Take 2 capsules (0.8 mg total) by mouth daily after supper. 02/14/19   Hennie Duos, MD  torsemide (DEMADEX) 20 MG tablet Take 2 tablets (40 mg total) by mouth 2 (two) times daily. 08/22/19   Darrick Grinder D, NP    Allergies Patient has no known allergies.   REVIEW OF SYSTEMS  Negative except as noted here or in the History of Present Illness.   PHYSICAL EXAMINATION  Initial Vital Signs Blood pressure 101/75, pulse 82, temperature (!) 97.5 F (36.4 C), temperature source Oral, resp. rate 20, height 6\' 1"  (1.854 m), weight 78.5 kg, SpO2 100 %.  Examination General: Well-developed, well-nourished male in no acute distress; appearance consistent with age of record HENT: normocephalic; atraumatic Eyes: pupils equal,  round and reactive to light; extraocular muscles intact; arcus senilis bilaterally Neck: supple Heart: regular rate and rhythm Lungs: clear to auscultation bilaterally Chest: Infusion catheter in right upper chest Abdomen: soft; nondistended; nontender; bowel sounds present Rectal: Nontender external hemorrhoids; normal sphincter tone; no stool in vault; prostate nontender Extremities: No deformity; full range of motion; pulses normal; trace edema of lower legs Neurologic: Awake, alert; motor function intact in all extremities and symmetric; no facial droop Skin: Warm and dry Psychiatric: Normal mood and affect   RESULTS  Summary of this visit's results, reviewed and interpreted by myself:   EKG Interpretation  Date/Time:  Saturday October 11 2019 03:18:52 EST Ventricular Rate:  78 PR Interval:    QRS Duration: 155 QT Interval:  474  QTC Calculation: 540 R Axis:   -75 Text Interpretation: Sinus rhythm Prolonged PR interval Left atrial enlargement Nonspecific IVCD with LAD LVH with secondary repolarization abnormality No significant change was found Confirmed by Shanon Rosser [phone]) on 10/11/2019 3:22:44 AM      Laboratory Studies: Results for orders placed or performed during the hospital encounter of 10/11/19 (from the past 24 hour(s))  Urinalysis, Routine w reflex microscopic     Status: Abnormal   Collection Time: 10/11/19  1:42 AM  Result Value Ref Range   Color, Urine YELLOW YELLOW   APPearance CLEAR CLEAR   Specific Gravity, Urine 1.010 1.005 - 1.030   pH 6.0 5.0 - 8.0   Glucose, UA NEGATIVE NEGATIVE mg/dL   Hgb urine dipstick TRACE (A) NEGATIVE   Bilirubin Urine NEGATIVE NEGATIVE   Ketones, ur NEGATIVE NEGATIVE mg/dL   Protein, ur NEGATIVE NEGATIVE mg/dL   Nitrite NEGATIVE NEGATIVE   Leukocytes,Ua NEGATIVE NEGATIVE  Urinalysis, Microscopic (reflex)     Status: Abnormal   Collection Time: 10/11/19  1:42 AM  Result Value Ref Range   RBC / HPF 0-5 0 - 5 RBC/hpf    WBC, UA 0-5 0 - 5 WBC/hpf   Bacteria, UA FEW (A) NONE SEEN   Squamous Epithelial / LPF 0-5 0 - 5   Hyaline Casts, UA PRESENT    Ca Oxalate Crys, UA PRESENT    Imaging Studies: Acute Abd Series  Result Date: 10/11/2019 CLINICAL DATA:  Constipation and shortness of breath EXAM: DG ABDOMEN ACUTE W/ 1V CHEST COMPARISON:  He September 25, 2019. FINDINGS: There is no evidence of dilated bowel loops or free intraperitoneal air. No radiopaque calculi or other significant radiographic abnormality is seen. There is unchanged cardiomegaly. A left-sided pacemaker seen with the lead tips in the right atrium right ventricle. Both lungs are clear. Calcifications seen within the left upper quadrant. IMPRESSION: Negative abdominal radiographs.  No acute cardiopulmonary disease. Electronically Signed   By: Prudencio Pair M.D.   On: 10/11/2019 02:50    ED COURSE and MDM  Nursing notes, initial and subsequent vitals signs, including pulse oximetry, reviewed and interpreted by myself.  Vitals:   10/11/19 0122  BP: 101/75  Pulse: 82  Resp: 20  Temp: (!) 97.5 F (36.4 C)  TempSrc: Oral  SpO2: 100%  Weight: 78.5 kg  Height: 6\' 1"  (1.854 m)   Medications  alum & mag hydroxide-simeth (MAALOX/MYLANTA) 200-200-20 MG/5ML suspension 15 mL (15 mLs Oral Given 10/11/19 0320)   4:04 AM Patient complained of heartburn which was relieved with Maalox.  EKG is unchanged.  There is no evidence of urinary tract infection on urinalysis but urine was sent for culture.  His prostate was nontender.  I suspect his symptoms are due to recent change in prostate medication.  He was advised to contact his urologist on Monday (the day after tomorrow).  His complaint of constipation without radiographic evidence and with recent bowel movement may be due to referred discomfort from his urinary tract.   PROCEDURES  Procedures   ED DIAGNOSES     ICD-10-CM   1. Dysuria  R30.0        Denham Mose, Jenny Reichmann, MD 10/11/19 [phone]

## 2019-10-12 LAB — URINE CULTURE: Culture: <10000 — AB

## 2019-10-21 ENCOUNTER — Telehealth (HOSPITAL_COMMUNITY): Payer: Self-pay | Admitting: *Deleted

## 2019-10-21 NOTE — Telephone Encounter (Signed)
Per Amy hold torsemide for 2 days... restart torsemide Friday at 40mg  daily. Labs in  7 days with home health.

## 2019-10-21 NOTE — Telephone Encounter (Signed)
Creatinine 2.70 BUN 61  labs drawn on 10/17/2019

## 2019-10-22 ENCOUNTER — Other Ambulatory Visit: Payer: Self-pay

## 2019-10-22 ENCOUNTER — Ambulatory Visit (HOSPITAL_BASED_OUTPATIENT_CLINIC_OR_DEPARTMENT_OTHER)
Admission: RE | Admit: 2019-10-22 | Discharge: 2019-10-22 | Disposition: A | Discharge status: Home / Self Care | Payer: Medicare Other | Source: Ambulatory Visit

## 2019-10-22 ENCOUNTER — Encounter (HOSPITAL_COMMUNITY): Payer: Self-pay | Admitting: Internal Medicine

## 2019-10-22 ENCOUNTER — Ambulatory Visit (HOSPITAL_BASED_OUTPATIENT_CLINIC_OR_DEPARTMENT_OTHER)
Admission: RE | Admit: 2019-10-22 | Discharge: 2019-10-22 | Disposition: A | Discharge status: Home / Self Care | Payer: Medicare Other | Source: Ambulatory Visit | Attending: Internal Medicine | Admitting: Internal Medicine

## 2019-10-22 ENCOUNTER — Other Ambulatory Visit (HOSPITAL_COMMUNITY): Payer: Self-pay | Admitting: Internal Medicine

## 2019-10-22 VITALS — BP 110/63 | HR 76 | Wt 176.4 lb

## 2019-10-22 DIAGNOSIS — I340 Nonrheumatic mitral (valve) insufficiency: Secondary | ICD-10-CM

## 2019-10-22 DIAGNOSIS — I2510 Atherosclerotic heart disease of native coronary artery without angina pectoris: Secondary | ICD-10-CM | POA: Insufficient documentation

## 2019-10-22 DIAGNOSIS — Z7951 Long term (current) use of inhaled steroids: Secondary | ICD-10-CM | POA: Insufficient documentation

## 2019-10-22 DIAGNOSIS — J449 Chronic obstructive pulmonary disease, unspecified: Secondary | ICD-10-CM | POA: Insufficient documentation

## 2019-10-22 DIAGNOSIS — N1832 Chronic kidney disease, stage 3b: Secondary | ICD-10-CM

## 2019-10-22 DIAGNOSIS — Z8249 Family history of ischemic heart disease and other diseases of the circulatory system: Secondary | ICD-10-CM | POA: Insufficient documentation

## 2019-10-22 DIAGNOSIS — I428 Other cardiomyopathies: Secondary | ICD-10-CM | POA: Insufficient documentation

## 2019-10-22 DIAGNOSIS — I130 Hypertensive heart and chronic kidney disease with heart failure and stage 1 through stage 4 chronic kidney disease, or unspecified chronic kidney disease: Secondary | ICD-10-CM | POA: Insufficient documentation

## 2019-10-22 DIAGNOSIS — Z9981 Dependence on supplemental oxygen: Secondary | ICD-10-CM | POA: Insufficient documentation

## 2019-10-22 DIAGNOSIS — Z87440 Personal history of urinary (tract) infections: Secondary | ICD-10-CM | POA: Insufficient documentation

## 2019-10-22 DIAGNOSIS — Z79899 Other long term (current) drug therapy: Secondary | ICD-10-CM | POA: Insufficient documentation

## 2019-10-22 DIAGNOSIS — I447 Left bundle-branch block, unspecified: Secondary | ICD-10-CM | POA: Insufficient documentation

## 2019-10-22 DIAGNOSIS — I5023 Acute on chronic systolic (congestive) heart failure: Secondary | ICD-10-CM | POA: Diagnosis not present

## 2019-10-22 DIAGNOSIS — K219 Gastro-esophageal reflux disease without esophagitis: Secondary | ICD-10-CM | POA: Insufficient documentation

## 2019-10-22 DIAGNOSIS — I5022 Chronic systolic (congestive) heart failure: Secondary | ICD-10-CM

## 2019-10-22 DIAGNOSIS — Z85528 Personal history of other malignant neoplasm of kidney: Secondary | ICD-10-CM | POA: Insufficient documentation

## 2019-10-22 DIAGNOSIS — Z86718 Personal history of other venous thrombosis and embolism: Secondary | ICD-10-CM | POA: Insufficient documentation

## 2019-10-22 DIAGNOSIS — D649 Anemia, unspecified: Secondary | ICD-10-CM | POA: Insufficient documentation

## 2019-10-22 DIAGNOSIS — Z95810 Presence of automatic (implantable) cardiac defibrillator: Secondary | ICD-10-CM | POA: Insufficient documentation

## 2019-10-22 DIAGNOSIS — Z87891 Personal history of nicotine dependence: Secondary | ICD-10-CM | POA: Insufficient documentation

## 2019-10-22 DIAGNOSIS — I083 Combined rheumatic disorders of mitral, aortic and tricuspid valves: Secondary | ICD-10-CM | POA: Insufficient documentation

## 2019-10-22 DIAGNOSIS — E1122 Type 2 diabetes mellitus with diabetic chronic kidney disease: Secondary | ICD-10-CM | POA: Insufficient documentation

## 2019-10-22 DIAGNOSIS — N183 Chronic kidney disease, stage 3 unspecified: Secondary | ICD-10-CM | POA: Insufficient documentation

## 2019-10-22 NOTE — Progress Notes (Signed)
Heart Failure TeleHealth Note  Due to national recommendations of social distancing due to [place] 19, Audio/video telehealth visit is felt to be most appropriate for this patient at this time.  See MyChart message from today for patient consent regarding telehealth for [facility].  Date:  10/22/2019   ID:  Spencer Rice, DOB 1941-04-14, MRN [id number]  Location: Home  Provider location: [facility] Type of Visit: Established patient  PCP:  [name]., MD  Primary HF: [place]  Chief Complaint: HF follow-up   History of Present Illness:  Spencer Rice is a 78 y.o. 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    . Admitted 1/26-2/26/2020 with cardiogenic shock.PICC placed 1/27 with initial CVP 26 coox 38% -> Milrinone started with improved diuresis and renal function. Echo 01/01/19 EF 10% with severe MR/TR, biatrial enlargement, RV with moderately reduced function. Taken for [facility] 01/07/19 with Mild, non-obstructive CAD, Moderate PAH with normal PAPI, and normal CO on milrinone 0.25 mcg/kg/min. PFTs 01/10/2001 FVC 2.28 (54%), FEV 2.0 (64%), DLCO 14.64 (53%). Discussed for LVADconsideration at [facility], and had planned to move forward,but patient had spontaneous RP bleed with symptomatic anemia complicating his admission.Discharged to SNF for rehab,   He presents today for f/u/ Remains on home milrinone. Having ups and down.  Weight 168. Struggles with ADLs. Says he can't do a whole lot. Feels weak. Recently cut back torsemide from 40 bid to 40 daily due to rising creatinine. Denies orthopnea or PND.   Echo today LVEF 5-10% RV mod reduced. Severe MR/TR.   Past Medical History:  Diagnosis Date  . AICD (automatic  cardioverter/defibrillator) present   . Cancer of left kidney ([place])    S/P OR 05/2014  . CHF (congestive heart failure) ([place])   . CKD (chronic kidney disease)   . 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 Coronary artery disease   . DVT (deep venous thrombosis) ([facility]) 1983   LLE  . GERD (gastroesophageal reflux disease)   . History of hiatal hernia   . Hypertension    Past Surgical History:  Procedure Laterality Date  . CARDIAC CATHETERIZATION  1980's  . IMPLANTABLE CARDIOVERTER DEFIBRILLATOR IMPLANT  07/21/2010  . IR FLUORO GUIDE CV LINE RIGHT  01/23/2019  . IR GENERIC HISTORICAL  12/23/2014   IR RADIOLOGIST EVAL & MGMT 12/23/2014 [name], MD [facility] INTERV RAD  . IR GENERIC HISTORICAL  07/20/2016   IR RADIOLOGIST EVAL & MGMT 07/20/2016 [facility] INTERV RAD  . IR RADIOLOGIST EVAL & MGMT  07/04/2017  . IR RADIOLOGIST EVAL & MGMT  07/08/2019  . IR RADIOLOGIST EVAL & MGMT  09/16/2019  . IR US GUIDE VASC ACCESS RIGHT  01/23/2019  . RENAL CRYOABLATION Left 05/2014  . RIGHT/LEFT HEART CATH AND CORONARY ANGIOGRAPHY N/A 01/07/2019   Procedure: RIGHT/LEFT HEART CATH AND CORONARY ANGIOGRAPHY;  Surgeon: Jolaine Artist, MD;  Location: [facility];  Service: Cardiovascular;  Laterality: N/A;     Current Outpatient Medications  Medication Sig Dispense Refill  . acetaminophen (TYLENOL) 325 MG tablet Take 2 tablets (650 mg total) by mouth every 4 (four) hours as needed for headache or mild pain.    Marland Kitchen amiodarone (PACERONE) 200 MG tablet TAKE 1 TABLET  BY MOUTH EVERY DAY 30 tablet 0  . benzonatate (TESSALON) 100 MG capsule Take 1 capsule (100 mg total) by mouth every 8 (eight) hours. 30 capsule 0  . lactobacillus (FLORANEX/LACTINEX) PACK Take 1 packet (1 g total) by mouth 3 (three) times daily with meals. 90 packet 0  . metolazone (ZAROXOLYN) 2.5 MG tablet Take 1 tablet (2.5 mg total) by mouth once a week. Every Friday. 15 tablet 3  . milrinone (PRIMACOR) 20  MG/100 ML SOLN infusion Inject 0.0205 mg/min into the vein continuous. 100 mL 0  . milrinone (PRIMACOR) 20 MG/100 ML SOLN infusion Inject into the vein.    . mometasone-formoterol (DULERA) 200-5 MCG/ACT AERO Inhale 2 puffs into the lungs 2 (two) times daily. 8.8 g 0  . OXYGEN Inhale 2 L into the lungs continuous.    . pantoprazole (PROTONIX) 40 MG tablet Take 1 tablet (40 mg total) by mouth daily. 90 tablet 3  . polyethylene glycol (MIRALAX / GLYCOLAX) packet Take 17 g by mouth daily. 30 each 0  . potassium chloride SA (KLOR-CON M20) 20 MEQ tablet Take 2 tablets (40 mEq total) by mouth 2 (two) times daily. 120 tablet 3  . spironolactone (ALDACTONE) 25 MG tablet TAKE 0.5 TABLETS (12.5 MG TOTAL) BY MOUTH AT BEDTIME. 45 tablet 1  . sucralfate (CARAFATE) 1 g tablet Take 1 tablet (1 g total) by mouth 4 (four) times daily -  with meals and at bedtime. 120 tablet 0  . tamsulosin (FLOMAX) 0.4 MG CAPS capsule Take 2 capsules (0.8 mg total) by mouth daily after supper. 60 capsule 0  . torsemide (DEMADEX) 20 MG tablet Take 2 tablets (40 mg total) by mouth 2 (two) times daily. 360 tablet 3   No current facility-administered medications for this encounter.     Allergies:   Patient has no known allergies.   Social History:  The patient  reports that he quit smoking about 43 years ago. His smoking use included cigarettes. He started smoking about 60 years ago. He has a 12.75 pack-year smoking history. He has never used smokeless tobacco. He reports that he does not drink alcohol or use drugs.   Family History:  The patient's family history includes Heart attack (age of onset: 58) in his father; Heart disease in his father; Hypertension in his father and mother.   ROS:  Please see the history of present illness.   All other systems are personally reviewed and negative.   Exam:   General:  Weak appearing. No resp difficulty HEENT: normal Neck: supple. JVP to jaw . Carotids 2+ bilat; no bruits. No  lymphadenopathy or thryomegaly appreciated. Cor: PMI laterally displaced. Regular rate & rhythm. 3/6 MR/TR +s3 + right chest PICC Lungs: clear Abdomen: soft, nontender, nondistended. No hepatosplenomegaly. No bruits or masses. Good bowel sounds. Extremities: no cyanosis, clubbing, rash, trace edema  Neuro: alert & orientedx3, cranial nerves grossly intact. moves all 4 extremities w/o difficulty. Affect pleasant   Recent Labs: 01/06/2019: TSH 0.906 02/06/2019: Magnesium 2.2 08/21/2019: ALT 27; B Natriuretic Peptide 2,515.0; BUN 42; Creatinine, Ser 2.00; Hemoglobin 11.8; Platelets 177; Potassium 5.0; Sodium 135  Personally reviewed   Wt Readings from Last 3 Encounters:  10/22/19 80 kg (176 lb 6.4 oz)  10/11/19 78.5 kg (173 lb)  08/21/19 80.7 kg (178 lb)      ASSESSMENT AND PLAN:  1. Chronic Systolic Heart Failure ->recent cardiogenic shock: Due to primarily to nonischemic cardiomyopathy (out of proportion to CAD). [place] 2015 with moderate disease. Has  Biotronik BiV ICD. Initial CO-OX 38% so milrinone started on 1/28.  -ECHO 01/01/19 EF 10% with severe MR/TR, biatrial enlargement, RV with moderately reduced function. - Echo today EF 5-10% with severe MR/TR, biatrial enlargement, RV with moderately reduced function.  - NYHA IV. Volume status mildly elevated.  - He is truly end-stage. We discussed options he feels he is too weak to considier VAD any more and renal dysfunction likely prohibitive. We also discussed Hospice but he says he is not ready for that  - Will ontinue milrinone 0.25 mcg/kg/min. Managed through [facility]. Labs every Friday with [facility].  - Check labs today and if renal function stable (creatinine running 20.-2.4) will resume torsemide 40 bid +metolazone every Friday. - No off spiro - No BB with inotrope dependence  2. H/o RP bleed with symptomatic anemia - Was on [facility] for h/o LV clot (no longer present) - Off AC currently. HGb stable. Will not restart   3. CKD Stage III,  Previously creatinine baseline 1.8-2.4.  - Continue labs with [facility]. Recheck today - Arlyce Harman stopped.   4. H/o Recurrent Klebsiella UTI - treatment per PCP  Total time spent 35 minutes. Over half that time spent discussing above.    Signed, Glori Bickers, MD  10/22/2019 12:09 PM  [facility] [address] [facility] [place] [address] [phone] (office) [phone] (fax)

## 2019-10-22 NOTE — Progress Notes (Signed)
  Echocardiogram 2D Echocardiogram has been performed.  Cidney Kirkwood G Elisandra Deshmukh 10/22/2019, 11:55 AM

## 2019-10-22 NOTE — Patient Instructions (Signed)
No medication changes today!  Your physician recommends that you schedule a follow-up appointment in: 1 month with Dr Haroldine Laws.  This is a telehealth visit.    At the [facility], you and your health needs are our priority. As part of our continuing mission to provide you with exceptional heart care, we have created designated Provider Care Teams. These Care Teams include your primary Cardiologist (physician) and Advanced Practice Providers (APPs- Physician Assistants and Nurse Practitioners) who all work together to provide you with the care you need, when you need it.   You may see any of the following providers on your designated Care Team at your next follow up: Marland Kitchen Dr Glori Bickers . Dr [name] . Darrick Grinder, NP . Lyda Jester, PA   Please be sure to bring in all your medications bottles to every appointment.

## 2019-10-23 ENCOUNTER — Emergency Department (HOSPITAL_BASED_OUTPATIENT_CLINIC_OR_DEPARTMENT_OTHER): Payer: Medicare Other

## 2019-10-23 ENCOUNTER — Other Ambulatory Visit: Payer: Self-pay

## 2019-10-23 ENCOUNTER — Encounter (HOSPITAL_BASED_OUTPATIENT_CLINIC_OR_DEPARTMENT_OTHER): Payer: Self-pay | Admitting: Emergency Medicine

## 2019-10-23 ENCOUNTER — Inpatient Hospital Stay (HOSPITAL_BASED_OUTPATIENT_CLINIC_OR_DEPARTMENT_OTHER)
Admission: EM | Admit: 2019-10-23 | Discharge: 2019-10-29 | DRG: 291 | Disposition: A | Discharge status: Hospice | Payer: Medicare Other | Attending: Internal Medicine | Admitting: Internal Medicine

## 2019-10-23 DIAGNOSIS — J9611 Chronic respiratory failure with hypoxia: Secondary | ICD-10-CM | POA: Diagnosis present

## 2019-10-23 DIAGNOSIS — N183 Chronic kidney disease, stage 3 unspecified: Secondary | ICD-10-CM

## 2019-10-23 DIAGNOSIS — I82441 Acute embolism and thrombosis of right tibial vein: Secondary | ICD-10-CM | POA: Diagnosis present

## 2019-10-23 DIAGNOSIS — I472 Ventricular tachycardia: Secondary | ICD-10-CM | POA: Diagnosis not present

## 2019-10-23 DIAGNOSIS — I824Y1 Acute embolism and thrombosis of unspecified deep veins of right proximal lower extremity: Secondary | ICD-10-CM | POA: Diagnosis not present

## 2019-10-23 DIAGNOSIS — K219 Gastro-esophageal reflux disease without esophagitis: Secondary | ICD-10-CM | POA: Diagnosis present

## 2019-10-23 DIAGNOSIS — I82431 Acute embolism and thrombosis of right popliteal vein: Secondary | ICD-10-CM | POA: Diagnosis present

## 2019-10-23 DIAGNOSIS — Z8249 Family history of ischemic heart disease and other diseases of the circulatory system: Secondary | ICD-10-CM

## 2019-10-23 DIAGNOSIS — Z87891 Personal history of nicotine dependence: Secondary | ICD-10-CM | POA: Diagnosis not present

## 2019-10-23 DIAGNOSIS — J449 Chronic obstructive pulmonary disease, unspecified: Secondary | ICD-10-CM | POA: Diagnosis present

## 2019-10-23 DIAGNOSIS — I5043 Acute on chronic combined systolic (congestive) and diastolic (congestive) heart failure: Secondary | ICD-10-CM | POA: Diagnosis present

## 2019-10-23 DIAGNOSIS — I82451 Acute embolism and thrombosis of right peroneal vein: Secondary | ICD-10-CM | POA: Diagnosis present

## 2019-10-23 DIAGNOSIS — I5042 Chronic combined systolic (congestive) and diastolic (congestive) heart failure: Secondary | ICD-10-CM | POA: Diagnosis present

## 2019-10-23 DIAGNOSIS — Z85528 Personal history of other malignant neoplasm of kidney: Secondary | ICD-10-CM

## 2019-10-23 DIAGNOSIS — N189 Chronic kidney disease, unspecified: Secondary | ICD-10-CM

## 2019-10-23 DIAGNOSIS — Z515 Encounter for palliative care: Secondary | ICD-10-CM | POA: Diagnosis not present

## 2019-10-23 DIAGNOSIS — N170 Acute kidney failure with tubular necrosis: Secondary | ICD-10-CM | POA: Diagnosis not present

## 2019-10-23 DIAGNOSIS — I5082 Biventricular heart failure: Secondary | ICD-10-CM | POA: Diagnosis present

## 2019-10-23 DIAGNOSIS — N179 Acute kidney failure, unspecified: Secondary | ICD-10-CM | POA: Diagnosis present

## 2019-10-23 DIAGNOSIS — Z9981 Dependence on supplemental oxygen: Secondary | ICD-10-CM

## 2019-10-23 DIAGNOSIS — Z87440 Personal history of urinary (tract) infections: Secondary | ICD-10-CM

## 2019-10-23 DIAGNOSIS — E876 Hypokalemia: Secondary | ICD-10-CM | POA: Diagnosis not present

## 2019-10-23 DIAGNOSIS — Z79899 Other long term (current) drug therapy: Secondary | ICD-10-CM | POA: Diagnosis not present

## 2019-10-23 DIAGNOSIS — Z20828 Contact with and (suspected) exposure to other viral communicable diseases: Secondary | ICD-10-CM | POA: Diagnosis present

## 2019-10-23 DIAGNOSIS — I428 Other cardiomyopathies: Secondary | ICD-10-CM | POA: Diagnosis present

## 2019-10-23 DIAGNOSIS — I081 Rheumatic disorders of both mitral and tricuspid valves: Secondary | ICD-10-CM | POA: Diagnosis present

## 2019-10-23 DIAGNOSIS — I82401 Acute embolism and thrombosis of unspecified deep veins of right lower extremity: Secondary | ICD-10-CM | POA: Diagnosis present

## 2019-10-23 DIAGNOSIS — I447 Left bundle-branch block, unspecified: Secondary | ICD-10-CM | POA: Diagnosis present

## 2019-10-23 DIAGNOSIS — I2510 Atherosclerotic heart disease of native coronary artery without angina pectoris: Secondary | ICD-10-CM | POA: Diagnosis present

## 2019-10-23 DIAGNOSIS — K117 Disturbances of salivary secretion: Secondary | ICD-10-CM

## 2019-10-23 DIAGNOSIS — I4729 Other ventricular tachycardia: Secondary | ICD-10-CM

## 2019-10-23 DIAGNOSIS — I824Z1 Acute embolism and thrombosis of unspecified deep veins of right distal lower extremity: Secondary | ICD-10-CM

## 2019-10-23 DIAGNOSIS — Z95810 Presence of automatic (implantable) cardiac defibrillator: Secondary | ICD-10-CM

## 2019-10-23 DIAGNOSIS — Z66 Do not resuscitate: Secondary | ICD-10-CM | POA: Diagnosis not present

## 2019-10-23 DIAGNOSIS — I130 Hypertensive heart and chronic kidney disease with heart failure and stage 1 through stage 4 chronic kidney disease, or unspecified chronic kidney disease: Secondary | ICD-10-CM | POA: Diagnosis present

## 2019-10-23 DIAGNOSIS — N1831 Chronic kidney disease, stage 3a: Secondary | ICD-10-CM | POA: Diagnosis not present

## 2019-10-23 DIAGNOSIS — I5084 End stage heart failure: Secondary | ICD-10-CM | POA: Diagnosis present

## 2019-10-23 DIAGNOSIS — I440 Atrioventricular block, first degree: Secondary | ICD-10-CM | POA: Diagnosis present

## 2019-10-23 DIAGNOSIS — Z86718 Personal history of other venous thrombosis and embolism: Secondary | ICD-10-CM

## 2019-10-23 DIAGNOSIS — I5023 Acute on chronic systolic (congestive) heart failure: Secondary | ICD-10-CM | POA: Diagnosis not present

## 2019-10-23 DIAGNOSIS — Z7951 Long term (current) use of inhaled steroids: Secondary | ICD-10-CM | POA: Diagnosis not present

## 2019-10-23 DIAGNOSIS — I509 Heart failure, unspecified: Secondary | ICD-10-CM | POA: Diagnosis not present

## 2019-10-23 DIAGNOSIS — D509 Iron deficiency anemia, unspecified: Secondary | ICD-10-CM | POA: Diagnosis present

## 2019-10-23 DIAGNOSIS — R570 Cardiogenic shock: Secondary | ICD-10-CM | POA: Diagnosis not present

## 2019-10-23 DIAGNOSIS — E1122 Type 2 diabetes mellitus with diabetic chronic kidney disease: Secondary | ICD-10-CM | POA: Diagnosis present

## 2019-10-23 DIAGNOSIS — Z7189 Other specified counseling: Secondary | ICD-10-CM

## 2019-10-23 LAB — CBC WITH DIFFERENTIAL/PLATELET
Abs Immature Granulocytes: 0.04 10*3/uL (ref 0.00–0.07)
Basophils Absolute: 0 10*3/uL (ref 0.0–0.1)
Basophils Relative: 0 %
Eosinophils Absolute: 0.1 10*3/uL (ref 0.0–0.5)
Eosinophils Relative: 1 %
HCT: 36.5 % — ABNORMAL LOW (ref 39.0–52.0)
Hemoglobin: 11.1 g/dL — ABNORMAL LOW (ref 13.0–17.0)
Immature Granulocytes: 1 %
Lymphocytes Relative: 12 %
Lymphs Abs: 0.8 10*3/uL (ref 0.7–4.0)
MCH: 22.3 pg — ABNORMAL LOW (ref 26.0–34.0)
MCHC: 30.4 g/dL (ref 30.0–36.0)
MCV: 73.4 fL — ABNORMAL LOW (ref 80.0–100.0)
Monocytes Absolute: 0.8 10*3/uL (ref 0.1–1.0)
Monocytes Relative: 13 %
Neutro Abs: 4.8 10*3/uL (ref 1.7–7.7)
Neutrophils Relative %: 73 %
Platelets: 140 10*3/uL — ABNORMAL LOW (ref 150–400)
RBC: 4.97 MIL/uL (ref 4.22–5.81)
RDW: 19.3 % — ABNORMAL HIGH (ref 11.5–15.5)
WBC: 6.5 10*3/uL (ref 4.0–10.5)
nRBC: 0.8 % — ABNORMAL HIGH (ref 0.0–0.2)

## 2019-10-23 LAB — COMPREHENSIVE METABOLIC PANEL
ALT: 21 U/L (ref 0–44)
AST: 45 U/L — ABNORMAL HIGH (ref 15–41)
Albumin: 3.7 g/dL (ref 3.5–5.0)
Alkaline Phosphatase: 110 U/L (ref 38–126)
Anion gap: 11 (ref 5–15)
BUN: 71 mg/dL — ABNORMAL HIGH (ref 8–23)
CO2: 25 mmol/L (ref 22–32)
Calcium: 9.6 mg/dL (ref 8.9–10.3)
Chloride: 99 mmol/L (ref 98–111)
Creatinine, Ser: 3.07 mg/dL — ABNORMAL HIGH (ref 0.61–1.24)
GFR calc Af Amer: 21 mL/min — ABNORMAL LOW (ref >60)
GFR calc non Af Amer: 18 mL/min — ABNORMAL LOW (ref >60)
Glucose, Bld: 113 mg/dL — ABNORMAL HIGH (ref 70–99)
Potassium: 5.1 mmol/L (ref 3.5–5.1)
Sodium: 135 mmol/L (ref 135–145)
Total Bilirubin: 2.7 mg/dL — ABNORMAL HIGH (ref 0.3–1.2)
Total Protein: 7.7 g/dL (ref 6.5–8.1)

## 2019-10-23 LAB — BRAIN NATRIURETIC PEPTIDE: B Natriuretic Peptide: 3200.7 pg/mL — ABNORMAL HIGH (ref 0.0–100.0)

## 2019-10-23 LAB — PROTIME-INR
INR: 1.4 — ABNORMAL HIGH (ref 0.8–1.2)
Prothrombin Time: 17.3 seconds — ABNORMAL HIGH (ref 11.4–15.2)

## 2019-10-23 LAB — SARS CORONAVIRUS 2 (TAT 6-24 HRS): SARS Coronavirus 2: NEGATIVE

## 2019-10-23 LAB — HEPARIN LEVEL (UNFRACTIONATED): Heparin Unfractionated: 0.41 IU/mL (ref 0.30–0.70)

## 2019-10-23 LAB — APTT: aPTT: 28 seconds (ref 24–36)

## 2019-10-23 LAB — TROPONIN I (HIGH SENSITIVITY)
Troponin I (High Sensitivity): 96 ng/L — ABNORMAL HIGH (ref <18)
Troponin I (High Sensitivity): 90 ng/L — ABNORMAL HIGH (ref <18)

## 2019-10-23 MED ORDER — HEPARIN BOLUS VIA INFUSION
3000.0000 [IU] | Freq: Once | INTRAVENOUS | Status: AC
  Administered 2019-10-23: 3000 [IU] via INTRAVENOUS

## 2019-10-23 MED ORDER — FUROSEMIDE 10 MG/ML IJ SOLN
80.0000 mg | Freq: Once | INTRAMUSCULAR | Status: AC
  Administered 2019-10-23: 13:00:00 80 mg via INTRAVENOUS
  Filled 2019-10-23: qty 8

## 2019-10-23 MED ORDER — SODIUM CHLORIDE 0.9% FLUSH
3.0000 mL | Freq: Two times a day (BID) | INTRAVENOUS | Status: DC
  Administered 2019-10-24 – 2019-10-25 (×3): 3 mL via INTRAVENOUS

## 2019-10-23 MED ORDER — HYDROCODONE-ACETAMINOPHEN 5-325 MG PO TABS: 1.0000 | ORAL_TABLET | Freq: Four times a day (QID) | ORAL | Status: DC | PRN

## 2019-10-23 MED ORDER — MILRINONE LACTATE IN DEXTROSE 20-5 MG/100ML-% IV SOLN: 0.2500 ug/kg/min | INTRAVENOUS | Status: DC

## 2019-10-23 MED ORDER — SODIUM CHLORIDE 0.9 % IV SOLN: 250.0000 mL | INTRAVENOUS | Status: DC | PRN

## 2019-10-23 MED ORDER — ACETAMINOPHEN 325 MG PO TABS: 650.0000 mg | ORAL_TABLET | Freq: Four times a day (QID) | ORAL | Status: DC | PRN

## 2019-10-23 MED ORDER — SODIUM CHLORIDE 0.9% FLUSH
3.0000 mL | Freq: Two times a day (BID) | INTRAVENOUS | Status: DC
  Administered 2019-10-24: 3 mL via INTRAVENOUS

## 2019-10-23 MED ORDER — SODIUM CHLORIDE 0.9% FLUSH: 3.0000 mL | INTRAVENOUS | Status: DC | PRN

## 2019-10-23 MED ORDER — AMIODARONE HCL 200 MG PO TABS
200.0000 mg | ORAL_TABLET | Freq: Every day | ORAL | Status: DC
  Administered 2019-10-24 – 2019-10-29 (×6): 200 mg via ORAL
  Filled 2019-10-23 (×6): qty 1

## 2019-10-23 MED ORDER — ONDANSETRON HCL 4 MG/2ML IJ SOLN: 4.0000 mg | Freq: Four times a day (QID) | INTRAMUSCULAR | Status: DC | PRN

## 2019-10-23 MED ORDER — PANTOPRAZOLE SODIUM 40 MG PO TBEC
40.0000 mg | DELAYED_RELEASE_TABLET | Freq: Every day | ORAL | Status: DC
  Administered 2019-10-24 – 2019-10-29 (×6): 40 mg via ORAL
  Filled 2019-10-23 (×6): qty 1

## 2019-10-23 MED ORDER — POLYETHYLENE GLYCOL 3350 17 G PO PACK
17.0000 g | PACK | Freq: Every day | ORAL | Status: DC | PRN
  Administered 2019-10-29: 17 g via ORAL
  Filled 2019-10-23: qty 1

## 2019-10-23 MED ORDER — HEPARIN (PORCINE) 25000 UT/250ML-% IV SOLN
1200.0000 [IU]/h | INTRAVENOUS | Status: DC
  Administered 2019-10-23 – 2019-10-24 (×2): 1300 [IU]/h via INTRAVENOUS
  Filled 2019-10-23 (×3): qty 250

## 2019-10-23 MED ORDER — MILRINONE LACTATE IN DEXTROSE 20-5 MG/100ML-% IV SOLN
0.2500 ug/kg/min | INTRAVENOUS | Status: DC
  Administered 2019-10-24: 0.25 ug/kg/min via INTRAVENOUS
  Filled 2019-10-23: qty 100

## 2019-10-23 MED ORDER — MOMETASONE FURO-FORMOTEROL FUM 200-5 MCG/ACT IN AERO
2.0000 | INHALATION_SPRAY | Freq: Two times a day (BID) | RESPIRATORY_TRACT | Status: DC
  Administered 2019-10-24 – 2019-10-29 (×9): 2 via RESPIRATORY_TRACT
  Filled 2019-10-23 (×2): qty 8.8

## 2019-10-23 MED ORDER — TAMSULOSIN HCL 0.4 MG PO CAPS
0.4000 mg | ORAL_CAPSULE | Freq: Every day | ORAL | Status: DC
  Administered 2019-10-24 – 2019-10-28 (×5): 0.4 mg via ORAL
  Filled 2019-10-23 (×5): qty 1

## 2019-10-23 MED ORDER — ONDANSETRON HCL 4 MG PO TABS: 4.0000 mg | ORAL_TABLET | Freq: Four times a day (QID) | ORAL | Status: DC | PRN

## 2019-10-23 MED ORDER — ACETAMINOPHEN 650 MG RE SUPP: 650.0000 mg | Freq: Four times a day (QID) | RECTAL | Status: DC | PRN

## 2019-10-23 NOTE — Progress Notes (Signed)
ANTICOAGULATION CONSULT NOTE   Pharmacy Consult for Heparin  Indication: DVT  No Known Allergies  Patient Measurements: Height: 6\' 1"  (185.4 cm) Weight: 176 lb 11.2 oz (80.2 kg) IBW/kg (Calculated) : 79.9  Vital Signs: Temp: 97.7 F (36.5 C) (11/19 2203) Temp Source: Oral (11/19 2203) BP: 102/69 (11/19 2203) Pulse Rate: 87 (11/19 2203)  Labs: Recent Labs    10/23/19 1048 10/23/19 1115 10/23/19 1257 10/23/19 1917  HGB 11.1*  --   --   --   HCT 36.5*  --   --   --   PLT 140*  --   --   --   APTT  --  28  --   --   LABPROT  --  17.3*  --   --   INR  --  1.4*  --   --   HEPARINUNFRC  --   --   --  0.41  CREATININE 3.07*  --   --   --   TROPONINIHS 96*  --  90*  --     Estimated Creatinine Clearance: 22.4 mL/min (A) (by C-G formula based on SCr of 3.07 mg/dL (H)).   Medical History: Past Medical History:  Diagnosis Date  . AICD (automatic cardioverter/defibrillator) present   . Cancer of left kidney ([place])    S/P OR 05/2014  . CHF (congestive heart failure) ([place])   . CKD (chronic kidney disease)   . 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 Coronary artery disease   . DVT (deep venous thrombosis) ([facility]) 1983   LLE  . GERD (gastroesophageal reflux disease)   . History of hiatal hernia   . Hypertension     Assessment: Heparin for DVT, history of RP bleed, Hgb 11.1 (monitor), heparin level therapeutic tonight  Goal of Therapy:  Heparin level 0.3-0.7 units/ml Monitor platelets by anticoagulation protocol: Yes   Plan:  Cont heparin at 1300 units/hr Confirmatory heparin level with AM labs  Narda Bonds 10/23/2019,11:06 PM

## 2019-10-23 NOTE — ED Notes (Signed)
ECG repeated per Dr. Tamera Punt.

## 2019-10-23 NOTE — Care Management (Signed)
78 year old male with AICD in place with milrinone drip at home who presents with a BNP of greater than 3000 and worsening shortness of breath.  He is also found to have an acute DVT in his right leg.  Unable to do CTA of chest due to creatinine of 3.  Heparin drip started at [facility] ED.  Patient has had runs of V. tach while at [facility], ED.  Uses 2 L of oxygen at baseline and was tachypneic on arrival.  ED started heparin drip and gave him 80 of IV Lasix.  Nasser from cardiology has been consulted.  Did not take the patient on his service due to some worsening renal failure and new diagnosis of DVT.  Given milrinone drip and heparin drip patient sent to stepdown unit.

## 2019-10-23 NOTE — ED Provider Notes (Signed)
[facility] EMERGENCY DEPARTMENT Provider Note   CSN: [id number] Arrival date & time: 10/23/19  1011     History   Chief Complaint Chief Complaint  Patient presents with  . Shortness of Breath    HPI Spencer Rice is a 78 y.o. male.     Patient is a 78 year old male who presents with shortness of breath.  He has a history of CHF, chronic kidney disease, COPD, hypertension.  He states he is chronically short of breath but has been getting worse since yesterday.  He is on home oxygen at 2 L/min.  He states he has not put on any weight.  His weight this morning was 168 which is where his baseline is.  He denies any increased leg swelling.  On my exam I noted that the right leg was little more swollen than the left and he says he has noticed this to but is not sure how long this has been going on.  No fevers.  He has a cough which is nonproductive.  No associated chest pain.  He has an AICD in place and is on a milrinone infusion.  He was seen at the CHF clinic yesterday for a follow-up and he said he had some mild shortness of breath but it got worse through the night.  No changes were made at yesterday's appointment.     Past Medical History:  Diagnosis Date  . AICD (automatic cardioverter/defibrillator) present   . Cancer of left kidney ([place])    S/P OR 05/2014  . CHF (congestive heart failure) ([place])   . CKD (chronic kidney disease)   . 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 Coronary artery disease   . DVT (deep venous thrombosis) (HCC) 1983   LLE  . GERD (gastroesophageal reflux disease)   . History of hiatal hernia   . Hypertension     Patient Active Problem List   Diagnosis Date Noted  . Chronic systolic (congestive) heart failure ([place]) 02/08/2019  . Cardiogenic shock ([place]) 02/01/2019  . Volume overload 02/01/2019  . Retroperitoneal bleed 02/01/2019  . Urinary tract infection due to Klebsiella species 02/01/2019  .  Weakness generalized   . Tachypnea   . Sinus tachycardia   . Hyponatremia   . Acute blood loss anemia   . DNR (do not resuscitate) discussion   . Palliative care by specialist   . Malnutrition of moderate degree 01/06/2019  . CKD (chronic kidney disease) stage 3, GFR 30-59 ml/min 12/30/2018  . PVCs (premature ventricular contractions) 12/30/2018  . Acute on chronic systolic congestive heart failure ([place]) 12/29/2018  . CHF (congestive heart failure) ([place]) 05/03/2018  . Acute on chronic systolic (congestive) heart failure ([place]) 05/03/2018  . Glucose intolerance (impaired glucose tolerance) 06/23/2016  . Vitreous floaters 12/08/2015  . TIA (transient ischemic attack) 12/08/2015  . SOB (shortness of breath) 12/08/2015  . Posterior vitreous detachment of both eyes 12/08/2015  . Pain due to dental caries 12/08/2015  . Other abnormal glucose 12/08/2015  . Osteoarthrosis 12/08/2015  . Long term (current) use of anticoagulants 12/08/2015  . Kidney mass 12/08/2015  . Hypersomnolence 12/08/2015  . Hyperopia with presbyopia of both eyes 12/08/2015  . Hyperlipidemia 12/08/2015  . History of orthopnea 12/08/2015  . COPD (chronic obstructive pulmonary disease) ([place]) 12/08/2015  . 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 AKI (acute kidney injury) ([place]) 12/08/2015  . Abnormal CT scan, kidney 12/08/2015  . Abdominal bloating 12/08/2015  .  Biventricular ICD (implantable cardioverter-defibrillator) in place 12/07/2015  . Chest pain at rest 11/29/2015  . Essential hypertension 11/29/2015  . Acute on chronic systolic CHF (congestive heart failure), NYHA class 1 ([place]) 11/29/2015  . Gastroesophageal reflux disease without esophagitis 11/29/2015  . Pain in the chest   . Renal cancer ([place]) 08/16/2015  . 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 Cancer of kidney ([place]) 01/19/2015  . Hematuria 09/04/2014  . Psychosexual dysfunction with inhibited sexual excitement 07/29/2014  . Pelvic pain in  male 07/29/2014  . Increased urinary frequency 07/29/2014  . Heartburn 07/29/2014  . EKG, abnormal 07/29/2014  . Corneal scar 07/29/2014  . 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 Cardiomyopathy ([place]) 07/29/2014  . CAD (coronary artery disease) 07/29/2014  . Disorder of kidney and ureter 04/09/2014    Past Surgical History:  Procedure Laterality Date  . CARDIAC CATHETERIZATION  1980's  . IMPLANTABLE CARDIOVERTER DEFIBRILLATOR IMPLANT  07/21/2010  . IR FLUORO GUIDE CV LINE RIGHT  01/23/2019  . IR GENERIC HISTORICAL  12/23/2014   IR RADIOLOGIST EVAL & MGMT 12/23/2014 [name], MD [facility] INTERV RAD  . IR GENERIC HISTORICAL  07/20/2016   IR RADIOLOGIST EVAL & MGMT 07/20/2016 [facility] INTERV RAD  . IR RADIOLOGIST EVAL & MGMT  07/04/2017  . IR RADIOLOGIST EVAL & MGMT  07/08/2019  . IR RADIOLOGIST EVAL & MGMT  09/16/2019  . IR US GUIDE VASC ACCESS RIGHT  01/23/2019  . RENAL CRYOABLATION Left 05/2014  . RIGHT/LEFT HEART CATH AND CORONARY ANGIOGRAPHY N/A 01/07/2019   Procedure: RIGHT/LEFT HEART CATH AND CORONARY ANGIOGRAPHY;  Surgeon: Jolaine Artist, MD;  Location: [facility];  Service: Cardiovascular;  Laterality: N/A;        Home Medications    Prior to Admission medications   Medication Sig Start Date End Date Taking? Authorizing Provider  acetaminophen (TYLENOL) 325 MG tablet Take 2 tablets (650 mg total) by mouth every 4 (four) hours as needed for headache or mild pain. 01/29/19   Shirley Friar, PA-C  amiodarone (PACERONE) 200 MG tablet TAKE 1 TABLET BY MOUTH EVERY DAY 10/02/19   Bensimhon, Shaune Pascal, MD  benzonatate (TESSALON) 100 MG capsule Take 1 capsule (100 mg total) by mouth every 8 (eight) hours. 02/14/19   Hennie Duos, MD  lactobacillus (FLORANEX/LACTINEX) PACK Take 1 packet (1 g total) by mouth 3 (three) times daily with meals. 02/14/19   Hennie Duos, MD  metolazone (ZAROXOLYN) 2.5 MG tablet Take 1 tablet (2.5 mg total) by mouth once a week.  Every Friday. 06/16/19 10/22/19  Bensimhon, Shaune Pascal, MD  milrinone (PRIMACOR) 20 MG/100 ML SOLN infusion Inject 0.0205 mg/min into the vein continuous. 02/14/19   Hennie Duos, MD  milrinone [facility]) 20 MG/100 ML SOLN infusion Inject into the vein. 02/14/19   [provider]  mometasone-formoterol (DULERA) 200-5 MCG/ACT AERO Inhale 2 puffs into the lungs 2 (two) times daily. 02/14/19   Hennie Duos, MD  OXYGEN Inhale 2 L into the lungs continuous.    [provider]  pantoprazole (PROTONIX) 40 MG tablet Take 1 tablet (40 mg total) by mouth daily. 08/23/19   Strader, Fransisco Hertz, PA-C  polyethylene glycol (MIRALAX / GLYCOLAX) packet Take 17 g by mouth daily. 02/14/19   Hennie Duos, MD  potassium chloride SA (KLOR-CON M20) 20 MEQ tablet Take 2 tablets (40 mEq total) by mouth 2 (two) times daily. 09/23/19   Bensimhon, Shaune Pascal, MD  spironolactone (ALDACTONE)  25 MG tablet TAKE 0.5 TABLETS (12.5 MG TOTAL) BY MOUTH AT BEDTIME. 02/18/19   Bensimhon, Shaune Pascal, MD  sucralfate (CARAFATE) 1 g tablet Take 1 tablet (1 g total) by mouth 4 (four) times daily -  with meals and at bedtime. 02/14/19   Hennie Duos, MD  tamsulosin (FLOMAX) 0.4 MG CAPS capsule Take 2 capsules (0.8 mg total) by mouth daily after supper. 02/14/19   Hennie Duos, MD  torsemide (DEMADEX) 20 MG tablet Take 2 tablets (40 mg total) by mouth 2 (two) times daily. 08/22/19   Conrad Starbuck, NP    Family History Family History  Problem Relation Age of Onset  . Heart disease Father   . Heart attack Father 19  . Hypertension Father   . Hypertension Mother     Social History Social History   Tobacco Use  . Smoking status: Former Smoker    Packs/day: 0.75    Years: 17.00    Pack years: 12.75    Types: Cigarettes    Start date: 05/08/1959    Quit date: 06/20/1976    Years since quitting: 43.3  . Smokeless tobacco: Never Used  Substance Use Topics  . Alcohol use: No  . Drug use: No     Allergies    Patient has no known allergies.   Review of Systems Review of Systems  Constitutional: Positive for fatigue. Negative for chills, diaphoresis and fever.  HENT: Negative for congestion, rhinorrhea and sneezing.   Eyes: Negative.   Respiratory: Positive for cough and shortness of breath. Negative for chest tightness.   Cardiovascular: Positive for leg swelling. Negative for chest pain.  Gastrointestinal: Negative for abdominal pain, blood in stool, diarrhea, nausea and vomiting.  Genitourinary: Negative for difficulty urinating, flank pain, frequency and hematuria.  Musculoskeletal: Negative for arthralgias and back pain.  Skin: Negative for rash.  Neurological: Negative for dizziness, speech difficulty, weakness, numbness and headaches.     Physical Exam Updated Vital Signs BP 101/73   Pulse 88   Temp 97.9 F (36.6 C) (Oral)   Resp 17   Ht 6\' 1"  (1.854 m)   Wt 80.2 kg   SpO2 100%   BMI 23.31 kg/m   Physical Exam Constitutional:      Appearance: He is well-developed.  HENT:     Head: Normocephalic and atraumatic.  Eyes:     Pupils: Pupils are equal, round, and reactive to light.  Neck:     Musculoskeletal: Normal range of motion and neck supple.  Cardiovascular:     Rate and Rhythm: Normal rate and regular rhythm.     Heart sounds: Normal heart sounds.  Pulmonary:     Effort: Pulmonary effort is normal. No respiratory distress.     Breath sounds: Normal breath sounds. No wheezing or rales.  Chest:     Chest wall: No tenderness.  Abdominal:     General: Bowel sounds are normal.     Palpations: Abdomen is soft.     Tenderness: There is no abdominal tenderness. There is no guarding or rebound.  Musculoskeletal: Normal range of motion.     Right lower leg: Edema present.     Left lower leg: Edema present.     Comments: 2+ pitting edema to the bilateral lower extremities, the right leg is a little more swollen than the left leg.  He states his prior DVT was in the  left leg.  Lymphadenopathy:     Cervical: No cervical adenopathy.  Skin:  General: Skin is warm and dry.     Findings: No rash.  Neurological:     Mental Status: He is alert and oriented to person, place, and time.      ED Treatments / Results  Labs (all labs ordered are listed, but only abnormal results are displayed) Labs Reviewed  COMPREHENSIVE METABOLIC PANEL - Abnormal; Notable for the following components:      Result Value   Glucose, Bld 113 (*)    BUN 71 (*)    Creatinine, Ser 3.07 (*)    AST 45 (*)    Total Bilirubin 2.7 (*)    GFR calc non Af Amer 18 (*)    GFR calc Af Amer 21 (*)    All other components within normal limits  CBC WITH DIFFERENTIAL/PLATELET - Abnormal; Notable for the following components:   Hemoglobin 11.1 (*)    HCT 36.5 (*)    MCV 73.4 (*)    MCH 22.3 (*)    RDW 19.3 (*)    Platelets 140 (*)    nRBC 0.8 (*)    All other components within normal limits  BRAIN NATRIURETIC PEPTIDE - Abnormal; Notable for the following components:   B Natriuretic Peptide 3,200.7 (*)    All other components within normal limits  PROTIME-INR - Abnormal; Notable for the following components:   Prothrombin Time 17.3 (*)    INR 1.4 (*)    All other components within normal limits  TROPONIN I (HIGH SENSITIVITY) - Abnormal; Notable for the following components:   Troponin I (High Sensitivity) 96 (*)    All other components within normal limits  TROPONIN I (HIGH SENSITIVITY) - Abnormal; Notable for the following components:   Troponin I (High Sensitivity) 90 (*)    All other components within normal limits  SARS CORONAVIRUS 2 (TAT 6-24 HRS)  APTT      EKG EKG Interpretation  Date/Time:  Thursday October 23 2019 10:28:12 EST Ventricular Rate:  88 PR Interval:    QRS Duration: 158 QT Interval:  438 QTC Calculation: 530 R Axis:   -77 Text Interpretation: Sinus rhythm Prolonged PR interval Probable left atrial enlargement Nonspecific IVCD with LAD Left  ventricular hypertrophy Inferior infarct, acute (RCA) Anterior Q waves, possibly due to LVH Lateral leads are also involved Probable RV involvement, suggest recording right precordial leads probable paced rhythm Confirmed by Malvin Johns [phone]) on 10/23/2019 10:36:54 AM   Radiology US Venous Img Lower Right (dvt Study)  Result Date: 10/23/2019 CLINICAL DATA:  Leg swelling EXAM: RIGHT LOWER EXTREMITY VENOUS DOPPLER ULTRASOUND TECHNIQUE: Gray-scale sonography with compression, as well as color and duplex ultrasound, were performed to evaluate the deep venous system from the level of the common femoral vein through the popliteal and proximal calf veins. COMPARISON:  None FINDINGS: Echogenic occlusive thrombus in the popliteal vein, extending into the peroneal and posterior tibial veins. Normal compressibility of the common femoral and superficial femoral veins . Visualized segments of the saphenous venous system normal in caliber and compressibility. Survey views of the contralateral common femoral vein are unremarkable. IMPRESSION: 1. POSITIVE for occlusive DVT in the right posterior tibial, peroneal, and popliteal veins. Electronically Signed   By: Lucrezia Europe M.D.   On: 10/23/2019 13:20   Dg Chest Port 1 View  Result Date: 10/23/2019 CLINICAL DATA:  Shortness of breath and dry cough. EXAM: PORTABLE CHEST 1 VIEW COMPARISON:  09/25/2019 FINDINGS: Cardiomegaly. Biventricular pacer/ICD from the left. Central line on the right with tip at the  SVC. There is no edema, consolidation, effusion, or pneumothorax. IMPRESSION: No evidence of active disease. Cardiomegaly. Electronically Signed   By: Monte Fantasia M.D.   On: 10/23/2019 11:11    Procedures Procedures (including critical care time)  Medications Ordered in ED Medications  heparin ADULT infusion 100 units/mL (25000 units/267mL sodium chloride 0.45%) (1,300 Units/hr Intravenous New Bag/Given 10/23/19 1404)  furosemide (LASIX) injection 80 mg  (80 mg Intravenous Given 10/23/19 1258)  heparin bolus via infusion 3,000 Units (3,000 Units Intravenous Bolus from Bag 10/23/19 1405)     Initial Impression / Assessment and Plan / ED Course  I have reviewed the triage vital signs and the nursing notes.  Pertinent labs & imaging results that were available during my care of the patient were reviewed by me and considered in my medical decision making (see chart for details).        Patient is a 78 year old male who presents with increased shortness of breath.  He has advanced CHF as well as COPD.  His lung sounds do not really seem consistent with COPD exacerbation.  Is maintaining his normal oxygen saturation on his baseline oxygen.  He does have an elevated BNP so he could have a component of congestive heart failure although he says his weight is similar to baseline and his leg swelling is not really any more than normal.  His chest x-ray does not show evidence of pneumonia or pulmonary edema.  Given his unilateral leg swelling, an ultrasound was performed and this showed positive acute DVT in his right leg.  I did review his records and he was previously on anticoagulants which were stopped during his hospitalization in January/February due to a spontaneous retroperitoneal bleed.  He currently is not on anticoagulants and was started on heparin in the ED.  His shortness of breath could potentially be related to pulmonary emboli however I am unable to do a CT scan given that his creatinine is elevated.  However he is on anticoagulants now.  He did have a few short runs of V. tach.  He has an ICD in place but has not noticed any firing.  His troponins are elevated but not rising.  I spoke with Dr. Acie Fredrickson with cardiology who felt with his DVT and renal worsening, it may be better to admit him to the medicine team.  I spoke with Dr. Evangeline Gula who will admit the patient for further treatment.  CRITICAL CARE Performed by: Malvin Johns Total critical  care time: 60 minutes Critical care time was exclusive of separately billable procedures and treating other patients. Critical care was necessary to treat or prevent imminent or life-threatening deterioration. Critical care was time spent personally by me on the following activities: development of treatment plan with patient and/or surrogate as well as nursing, discussions with consultants, evaluation of patient's response to treatment, examination of patient, obtaining history from patient or surrogate, ordering and performing treatments and interventions, ordering and review of laboratory studies, ordering and review of radiographic studies, pulse oximetry and re-evaluation of patient's condition.  Final Clinical Impressions(s) / ED Diagnoses   Final diagnoses:  Acute on chronic congestive heart failure, unspecified heart failure type (HCC)  Acute deep vein thrombosis (DVT) of proximal vein of right lower extremity (HCC)  Ventricular tachycardia (paroxysmal) (HCC)  Acute renal failure superimposed on chronic kidney disease, unspecified CKD stage, unspecified acute renal failure type [facility])    ED Discharge Orders    None       Malvin Johns, MD  10/23/19 1432  

## 2019-10-23 NOTE — H&P (Signed)
History and Physical    Spencer Rice QMV:[id number] DOB: Dec 08, 1940 DOA: 10/23/2019  PCP: [name]., MD   Patient coming from: Home  Chief Complaint: SOB   HPI: Spencer Rice is a 78 y.o. 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  Patient reports that he felt slightly more short of breath than usual for the past couple days, but then worsened significantly overnight last night.  His dyspnea persisted through the morning and he went to the ED for evaluation.  Reports that his weight has been stable, denies fevers, reports increased leg swelling on the right, but denies any leg tenderness or chest pain.  He denies any dietary indiscretion.  Diuretic was reduced recently due to worsening renal function.  [facility] ED Course: Upon arrival to the ED, patient is found to be afebrile, saturating 100% on 2 L/min of supplemental oxygen, tachypneic in the mid 20s, and with blood pressure 96/66.  EKG features a sinus rhythm with first-degree AV nodal block, nonspecific IVCD with LAD, and LVH.  Chest x-ray is negative for acute cardiopulmonary disease.  Venous Doppler is notable for DVT involving the right lower extremity.  Chemistry panel features a BUN of 71 and creatinine of 3.07, up from 2.0 in September.  CBC is notable for mild microcytic anemia and slight thrombocytopenia.  BNP is elevated to 3200 and troponin is elevated to 96, down to 90 on repeat.  Patient was started on IV heparin infusion in the emergency department and treated with 80 mg IV Lasix.  Cardiology was consulted by the ED physician and recommended medical  admission.  COVID-19 screening test was negative in the ED.  Review of Systems:  All other systems reviewed and apart from HPI, are negative.  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   Coronary artery disease    DVT (deep venous thrombosis) (HCC) 1983   LLE   GERD (gastroesophageal reflux disease)    History of hiatal hernia    Hypertension     Past Surgical History:  Procedure Laterality Date   CARDIAC CATHETERIZATION  1980's   IMPLANTABLE CARDIOVERTER DEFIBRILLATOR IMPLANT  07/21/2010   IR FLUORO GUIDE CV LINE RIGHT  01/23/2019   IR GENERIC HISTORICAL  12/23/2014   IR RADIOLOGIST EVAL & MGMT 12/23/2014 [name], MD [facility] INTER[facility] RAD   IR GENERIC HISTORICAL  07/20/2016   IR RADIOLOGIST EVAL & MGMT 07/20/2016 [facility] INTERV RAD   IR RADIOLOGIST EVAL & MGMT  07/04/2017   IR RADIOLOGIST EVAL & MGMT  07/08/2019   IR RADIOLOGIST EVAL & MGMT  09/16/2019   IR US GUIDE VASC ACCESS RIGHT  01/23/2019   RENAL CRYOABLATION Left 05/2014   RIGHT/LEFT HEART CATH AND CORONARY ANGIOGRAPHY N/A 01/07/2019   Procedure: RIGHT/LEFT HEART CATH AND CORONARY ANGIOGRAPHY;  Surgeon: Jolaine Artist, MD;  Location: [facility];  Service: Cardiovascular;  Laterality: N/A;  reports that he quit smoking about 43 years ago. His smoking use included cigarettes. He started smoking about 60 years ago. He has a 12.75 pack-year smoking history. He has never used smokeless tobacco. He reports that he does not drink alcohol or use drugs.  No Known Allergies  Family History  Problem Relation Age of Onset   Heart disease Father    Heart attack Father 88   Hypertension Father    Hypertension Mother      Prior to Admission  medications   Medication Sig Start Date End Date Taking? Authorizing Provider  acetaminophen (TYLENOL) 325 MG tablet Take 2 tablets (650 mg total) by mouth every 4 (four) hours as needed for headache or mild pain. 01/29/19   Shirley Friar, PA-C  amiodarone (PACERONE) 200 MG tablet TAKE 1 TABLET BY MOUTH EVERY DAY 10/02/19   Bensimhon, Shaune Pascal, MD  benzonatate (TESSALON) 100 MG capsule Take 1 capsule (100 mg total) by mouth every 8 (eight) hours. 02/14/19   Hennie Duos, MD  lactobacillus (FLORANEX/LACTINEX) PACK Take 1 packet (1 g total) by mouth 3 (three) times daily with meals. 02/14/19   Hennie Duos, MD  metolazone (ZAROXOLYN) 2.5 MG tablet Take 1 tablet (2.5 mg total) by mouth once a week. Every Friday. 06/16/19 10/22/19  Bensimhon, Shaune Pascal, MD  milrinone (PRIMACOR) 20 MG/100 ML SOLN infusion Inject 0.0205 mg/min into the vein continuous. 02/14/19   Hennie Duos, MD  milrinone [facility]) 20 MG/100 ML SOLN infusion Inject into the vein. 02/14/19   [provider]  mometasone-formoterol (DULERA) 200-5 MCG/ACT AERO Inhale 2 puffs into the lungs 2 (two) times daily. 02/14/19   Hennie Duos, MD  OXYGEN Inhale 2 L into the lungs continuous.    [provider]  pantoprazole (PROTONIX) 40 MG tablet Take 1 tablet (40 mg total) by mouth daily. 08/23/19   Strader, Fransisco Hertz, PA-C  polyethylene glycol (MIRALAX / GLYCOLAX) packet Take 17 g by mouth daily. 02/14/19   Hennie Duos, MD  potassium chloride SA (KLOR-CON M20) 20 MEQ tablet Take 2 tablets (40 mEq total) by mouth 2 (two) times daily. 09/23/19   Bensimhon, Shaune Pascal, MD  spironolactone (ALDACTONE) 25 MG tablet TAKE 0.5 TABLETS (12.5 MG TOTAL) BY MOUTH AT BEDTIME. 02/18/19   Bensimhon, Shaune Pascal, MD  sucralfate (CARAFATE) 1 g tablet Take 1 tablet (1 g total) by mouth 4 (four) times daily -  with meals and at bedtime. 02/14/19   Hennie Duos, MD  tamsulosin (FLOMAX) 0.4 MG CAPS capsule Take 2 capsules  (0.8 mg total) by mouth daily after supper. 02/14/19   Hennie Duos, MD  torsemide (DEMADEX) 20 MG tablet Take 2 tablets (40 mg total) by mouth 2 (two) times daily. 08/22/19   Darrick Grinder D, NP    Physical Exam: Vitals:   10/23/19 1930 10/23/19 2000 10/23/19 2030 10/23/19 2203  BP: 104/76 103/76 104/70 102/69  Pulse: 83  85 87  Resp: 19 (!) 24 (!) 21 20  Temp:    97.7 F (36.5 C)  TempSrc:    Oral  SpO2: 100%  100% 100%  Weight:      Height:        Constitutional: NAD, calm, frail  Eyes: PERTLA, lids and conjunctivae normal ENMT: Mucous membranes are moist. Posterior pharynx clear of any exudate or lesions.   Neck: normal, supple, no masses, no thyromegaly Respiratory: no wheezing, no crackles. Mild tachypnea. No accessory muscle use.  Cardiovascular:  S1 & S2 heard, regular rate and rhythm. Pretibial pitting edema bilaterally Rt>Lt.   Abdomen: No distension, no tenderness, soft. Bowel sounds active.  Musculoskeletal: no clubbing / cyanosis. No joint deformity upper and lower extremities.   Skin: no significant rashes, lesions, ulcers. Warm, dry, well-perfused. Neurologic: No gross facial asymmetry. Sensation intact. Moving all extremities.  Psychiatric: Alert and oriented x 3. Very pleasant, cooperative.      Labs on Admission: I have personally reviewed following labs and imaging studies  CBC: Recent Labs  Lab 10/23/19 1048  WBC 6.5  NEUTROABS 4.8  HGB 11.1*  HCT 36.5*  MCV 73.4*  PLT 299*   Basic Metabolic Panel: Recent Labs  Lab 10/23/19 1048  NA 135  K 5.1  CL 99  CO2 25  GLUCOSE 113*  BUN 71*  CREATININE 3.07*  CALCIUM 9.6   GFR: Estimated Creatinine Clearance: 22.4 mL/min (A) (by C-G formula based on SCr of 3.07 mg/dL (H)). Liver Function Tests: Recent Labs  Lab 10/23/19 1048  AST 45*  ALT 21  ALKPHOS 110  BILITOT 2.7*  PROT 7.7  ALBUMIN 3.7   No results for input(s): LIPASE, AMYLASE in the last 168 hours. No results for input(s): AMMONIA  in the last 168 hours. Coagulation Profile: Recent Labs  Lab 10/23/19 1115  INR 1.4*   Cardiac Enzymes: No results for input(s): CKTOTAL, CKMB, CKMBINDEX, TROPONINI in the last 168 hours. BNP (last 3 results) No results for input(s): PROBNP in the last 8760 hours. HbA1C: No results for input(s): HGBA1C in the last 72 hours. CBG: No results for input(s): GLUCAP in the last 168 hours. Lipid Profile: No results for input(s): CHOL, HDL, LDLCALC, TRIG, CHOLHDL, LDLDIRECT in the last 72 hours. Thyroid Function Tests: No results for input(s): TSH, T4TOTAL, FREET4, T3FREE, THYROIDAB in the last 72 hours. Anemia Panel: No results for input(s): VITAMINB12, FOLATE, FERRITIN, TIBC, IRON, RETICCTPCT in the last 72 hours. Urine analysis:    Component Value Date/Time   COLORURINE YELLOW 10/11/2019 0142   APPEARANCEUR CLEAR 10/11/2019 0142   LABSPEC 1.010 10/11/2019 0142   PHURINE 6.0 10/11/2019 0142   GLUCOSEU NEGATIVE 10/11/2019 0142   HGBUR TRACE (A) 10/11/2019 0142   BILIRUBINUR NEGATIVE 10/11/2019 0142   KETONESUR NEGATIVE 10/11/2019 0142   PROTEINUR NEGATIVE 10/11/2019 0142   NITRITE NEGATIVE 10/11/2019 0142   LEUKOCYTESUR NEGATIVE 10/11/2019 0142   Sepsis Labs: @LABRCNTIP (procalcitonin:4,lacticidven:4) ) Recent Results (from the past 240 hour(s))  SARS CORONAVIRUS 2 (TAT 6-24 HRS) Nasopharyngeal Nasopharyngeal Swab     Status: None   Collection Time: 10/23/19  2:06 PM   Specimen: Nasopharyngeal Swab  Result Value Ref Range Status   SARS Coronavirus 2 NEGATIVE NEGATIVE Final    Comment: (NOTE) SARS-CoV-2 target nucleic acids are NOT DETECTED. The SARS-CoV-2 RNA is generally detectable in upper and lower respiratory specimens during the acute phase of infection. Negative results do not preclude SARS-CoV-2 infection, do not rule out co-infections with other pathogens, and should not be used as the sole basis for treatment or other patient management decisions. Negative results  must be combined with clinical observations, patient history, and epidemiological information. The expected result is Negative. Fact Sheet for Patients: SugarRoll.be Fact Sheet for Healthcare Providers: https://www.woods-mathews.com/ This test is not yet approved or cleared by the [place] FDA and  has been authorized for detection and/or diagnosis of SARS-CoV-2 by FDA under an Emergency Use Authorization (EUA). This EUA will remain  in effect (meaning this test can be used) for the  duration of the COVID-19 declaration under Section 56 4(b)(1) of the Act, 21 U.S.C. section 360bbb-3(b)(1), unless the authorization is terminated or revoked sooner. Performed at [facility], [place] [address] [place], [place] [address]      Radiological Exams on Admission: US Venous Img Lower Right (dvt Study)  Result Date: 10/23/2019 CLINICAL DATA:  Leg swelling EXAM: RIGHT LOWER EXTREMITY VENOUS DOPPLER ULTRASOUND TECHNIQUE: Gray-scale sonography with compression, as well as color and duplex ultrasound, were performed to evaluate the deep venous system from the level of the common femoral vein through the popliteal and proximal calf veins. COMPARISON:  None FINDINGS: Echogenic occlusive thrombus in the popliteal vein, extending into the peroneal and posterior tibial veins. Normal compressibility of the common femoral and superficial femoral veins . Visualized segments of the saphenous venous system normal in caliber and compressibility. Survey views of the contralateral common femoral vein are unremarkable. IMPRESSION: 1. POSITIVE for occlusive DVT in the right posterior tibial, peroneal, and popliteal veins. Electronically Signed   By: Lucrezia Europe M.D.   On: 10/23/2019 13:20   Dg Chest Port 1 View  Result Date: 10/23/2019 CLINICAL DATA:  Shortness of breath and dry cough. EXAM: PORTABLE CHEST 1 VIEW COMPARISON:  09/25/2019 FINDINGS: Cardiomegaly.  Biventricular pacer/ICD from the left. Central line on the right with tip at the [facility]. There is no edema, consolidation, effusion, or pneumothorax. IMPRESSION: No evidence of active disease. Cardiomegaly. Electronically Signed   By: Monte Fantasia M.D.   On: 10/23/2019 11:11    EKG: Independently reviewed. Sinus rhythm, 1st degree AV block, non-specific IVCD with LAD, LVH.   Assessment/Plan   1. Right leg DVT  - Presents with SOB and increased leg swelling Rt>Lt, and is found to have RLE DVT  - He has hx of LLE DVT and mural thrombus, and had been anticoagulated until retroperitoneal bleed early this year - He was started on IV heparin in ED  - Continue IV heparin infusion, monitor for bleeding, follow daily CBC   2. Acute kidney injury superimposed on CKD III  - SCr is 3.07 on admission, up from 2.0 in September 2020  - Check renal US, check FEUrea, renally-dose medications, repeat chem panel in am   3. Chronic combined systolic & diastolic CHF; NSVT  - Presents with increased SOB, denies wt gain, no edema noted on CXR, ?SOB secondary to PE in light of his DVT and clear CXR but he reports improvement after Lasix 80 mg IV in ED  - EF was only 5-10% on 10/22/19 and he is dependent on milrinone infusion  - He has NSVT, on amiodarone, ICD in place  - He was given 80 mg IV Lasix in ED  - Cardiology consulting and much appreciated  - Defer further diuretic dosing to cardiology, SLIV, fluid-restrict diet, follow daily wt and I/O's   4. COPD  - No cough or wheezing on admission  - Continue Dulera     PPE: Mask, face shield  DVT prophylaxis: IV heparin  Code Status: Full  Family Communication: Discussed with patient  Consults called: Cardiology consulted by ED physician  Admission status: Inpatient     Vianne Bulls, MD [facility] Pager [phone]  If 7PM-7AM, please contact night-coverage [email or web address] Password [email or web address]  10/23/2019, 11:01 PM

## 2019-10-23 NOTE — Progress Notes (Signed)
ANTICOAGULATION CONSULT NOTE - Initial Consult  Pharmacy Consult for heparkin Indication: DVT and DVT/possible PE  No Known Allergies  Patient Measurements: Height: 6\' 1"  (185.4 cm) Weight: 176 lb 11.2 oz (80.2 kg) IBW/kg (Calculated) : 79.9 Heparin Dosing Weight: 80 kg  Vital Signs: Temp: 97.9 F (36.6 C) (11/19 1030) Temp Source: Oral (11/19 1030) BP: 102/68 (11/19 1300) Pulse Rate: 80 (11/19 1300)  Labs: Recent Labs    10/23/19 1048 10/23/19 1257  HGB 11.1*  --   HCT 36.5*  --   PLT 140*  --   CREATININE 3.07*  --   TROPONINIHS 96* 90*    Estimated Creatinine Clearance: 22.4 mL/min (A) (by C-G formula based on SCr of 3.07 mg/dL (H)).   Medical History: Past Medical History:  Diagnosis Date  . AICD (automatic cardioverter/defibrillator) present   . Cancer of left kidney ([place])    S/P OR 05/2014  . CHF (congestive heart failure) ([place])   . CKD (chronic kidney disease)   . 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 Coronary artery disease   . DVT (deep venous thrombosis) (HCC) 1983   LLE  . GERD (gastroesophageal reflux disease)   . History of hiatal hernia   . Hypertension     Medications:  (Not in a hospital admission)   Assessment:  Patient with recent history of retro-peritoneal bleed, as well as CKD and HF, presents with confirmed DVT as well as breathing problems.  Goal of Therapy:  Heparin level 0.3-0.7 units/ml Monitor platelets by anticoagulation protocol: Yes   Plan:  Give 3000 units bolus x 1 Start heparin infusion at 1300 units/hr Check anti-Xa level in 8 hours and daily while on heparin Continue to monitor H&H and platelets  Mallie Mussel A Nancylee Gaines 10/23/2019,1:42 PM

## 2019-10-23 NOTE — ED Notes (Signed)
Pt on monitor 

## 2019-10-23 NOTE — ED Triage Notes (Signed)
Pt states sob all night long.  Denies fever.  No changes in medications.  More sob with exertion.  Dry cough.  No known illness exposures

## 2019-10-23 NOTE — ED Notes (Signed)
Attempted to call report; this RN contact info provided for callback 

## 2019-10-24 ENCOUNTER — Inpatient Hospital Stay (HOSPITAL_COMMUNITY): Payer: Medicare Other

## 2019-10-24 DIAGNOSIS — I82401 Acute embolism and thrombosis of unspecified deep veins of right lower extremity: Secondary | ICD-10-CM

## 2019-10-24 DIAGNOSIS — N179 Acute kidney failure, unspecified: Secondary | ICD-10-CM

## 2019-10-24 DIAGNOSIS — N189 Chronic kidney disease, unspecified: Secondary | ICD-10-CM

## 2019-10-24 DIAGNOSIS — I428 Other cardiomyopathies: Secondary | ICD-10-CM

## 2019-10-24 LAB — HEPATIC FUNCTION PANEL
ALT: 18 U/L (ref 0–44)
AST: 33 U/L (ref 15–41)
Albumin: 3.1 g/dL — ABNORMAL LOW (ref 3.5–5.0)
Alkaline Phosphatase: 103 U/L (ref 38–126)
Bilirubin, Direct: 1 mg/dL — ABNORMAL HIGH (ref 0.0–0.2)
Indirect Bilirubin: 1.4 mg/dL — ABNORMAL HIGH (ref 0.3–0.9)
Total Bilirubin: 2.4 mg/dL — ABNORMAL HIGH (ref 0.3–1.2)
Total Protein: 6.5 g/dL (ref 6.5–8.1)

## 2019-10-24 LAB — BASIC METABOLIC PANEL
Anion gap: 14 (ref 5–15)
BUN: 67 mg/dL — ABNORMAL HIGH (ref 8–23)
CO2: 25 mmol/L (ref 22–32)
Calcium: 9.2 mg/dL (ref 8.9–10.3)
Chloride: 99 mmol/L (ref 98–111)
Creatinine, Ser: 2.89 mg/dL — ABNORMAL HIGH (ref 0.61–1.24)
GFR calc Af Amer: 23 mL/min — ABNORMAL LOW (ref >60)
GFR calc non Af Amer: 20 mL/min — ABNORMAL LOW (ref >60)
Glucose, Bld: 110 mg/dL — ABNORMAL HIGH (ref 70–99)
Potassium: 4.1 mmol/L (ref 3.5–5.1)
Sodium: 138 mmol/L (ref 135–145)

## 2019-10-24 LAB — COOXEMETRY PANEL
Carboxyhemoglobin: 1.5 % (ref 0.5–1.5)
Carboxyhemoglobin: 1.2 % (ref 0.5–1.5)
Carboxyhemoglobin: 1.8 % — ABNORMAL HIGH (ref 0.5–1.5)
Methemoglobin: 1.2 % (ref 0.0–1.5)
Methemoglobin: 0.8 % (ref 0.0–1.5)
Methemoglobin: 1.2 % (ref 0.0–1.5)
O2 Saturation: 42.7 %
O2 Saturation: 43.3 %
O2 Saturation: 52.1 %
Total hemoglobin: 10.4 g/dL — ABNORMAL LOW (ref 12.0–16.0)
Total hemoglobin: 10.2 g/dL — ABNORMAL LOW (ref 12.0–16.0)
Total hemoglobin: 10.1 g/dL — ABNORMAL LOW (ref 12.0–16.0)

## 2019-10-24 LAB — CBC
HCT: 32.2 % — ABNORMAL LOW (ref 39.0–52.0)
Hemoglobin: 10 g/dL — ABNORMAL LOW (ref 13.0–17.0)
MCH: 22.3 pg — ABNORMAL LOW (ref 26.0–34.0)
MCHC: 31.1 g/dL (ref 30.0–36.0)
MCV: 71.9 fL — ABNORMAL LOW (ref 80.0–100.0)
Platelets: 120 10*3/uL — ABNORMAL LOW (ref 150–400)
RBC: 4.48 MIL/uL (ref 4.22–5.81)
RDW: 18.8 % — ABNORMAL HIGH (ref 11.5–15.5)
WBC: 7.4 10*3/uL (ref 4.0–10.5)
nRBC: 0.7 % — ABNORMAL HIGH (ref 0.0–0.2)

## 2019-10-24 LAB — MAGNESIUM: Magnesium: 2.5 mg/dL — ABNORMAL HIGH (ref 1.7–2.4)

## 2019-10-24 LAB — HEPARIN LEVEL (UNFRACTIONATED): Heparin Unfractionated: 0.5 IU/mL (ref 0.30–0.70)

## 2019-10-24 MED ORDER — MILRINONE LACTATE IN DEXTROSE 20-5 MG/100ML-% IV SOLN
0.5000 ug/kg/min | INTRAVENOUS | Status: DC
  Administered 2019-10-24 – 2019-10-25 (×2): 0.375 ug/kg/min via INTRAVENOUS
  Administered 2019-10-26 – 2019-10-29 (×5): 0.5 ug/kg/min via INTRAVENOUS
  Filled 2019-10-24 (×11): qty 100

## 2019-10-24 MED ORDER — FUROSEMIDE 10 MG/ML IJ SOLN
80.0000 mg | Freq: Two times a day (BID) | INTRAMUSCULAR | Status: DC
  Administered 2019-10-24 – 2019-10-26 (×4): 80 mg via INTRAVENOUS
  Filled 2019-10-24 (×4): qty 8

## 2019-10-24 MED ORDER — CHLORHEXIDINE GLUCONATE CLOTH 2 % EX PADS
6.0000 | MEDICATED_PAD | Freq: Every day | CUTANEOUS | Status: DC
  Administered 2019-10-24 – 2019-10-29 (×5): 6 via TOPICAL

## 2019-10-24 MED ORDER — SODIUM CHLORIDE 0.9% FLUSH: 10.0000 mL | INTRAVENOUS | Status: DC | PRN

## 2019-10-24 MED ORDER — SODIUM CHLORIDE 0.9% FLUSH
10.0000 mL | Freq: Two times a day (BID) | INTRAVENOUS | Status: DC
  Administered 2019-10-24 – 2019-10-28 (×5): 10 mL

## 2019-10-24 MED ORDER — FUROSEMIDE 10 MG/ML IJ SOLN
80.0000 mg | Freq: Once | INTRAMUSCULAR | Status: AC
  Administered 2019-10-24: 80 mg via INTRAVENOUS
  Filled 2019-10-24: qty 8

## 2019-10-24 NOTE — Progress Notes (Addendum)
ANTICOAGULATION CONSULT NOTE   Pharmacy Consult for Heparin  Indication: DVT  No Known Allergies  Patient Measurements: Height: 6\' 1"  (185.4 cm) Weight: 171 lb (77.6 kg) IBW/kg (Calculated) : 79.9  Vital Signs: Temp: 98.4 F (36.9 C) (11/20 0517) Temp Source: Oral (11/20 0517) BP: 97/73 (11/20 0517) Pulse Rate: 87 (11/20 0517)  Labs: Recent Labs    10/23/19 1048 10/23/19 1115 10/23/19 1257 10/23/19 1917 10/24/19 0420  HGB 11.1*  --   --   --  10.0*  HCT 36.5*  --   --   --  32.2*  PLT 140*  --   --   --  120*  APTT  --  28  --   --   --   LABPROT  --  17.3*  --   --   --   INR  --  1.4*  --   --   --   HEPARINUNFRC  --   --   --  0.41 0.50  CREATININE 3.07*  --   --   --  2.89*  TROPONINIHS 96*  --  90*  --   --     Estimated Creatinine Clearance: 23.1 mL/min (A) (by C-G formula based on SCr of 2.89 mg/dL (H)).   Medical History: Past Medical History:  Diagnosis Date  . AICD (automatic cardioverter/defibrillator) present   . Cancer of left kidney ([place])    S/P OR 05/2014  . CHF (congestive heart failure) ([place])   . CKD (chronic kidney disease)   . 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 Coronary artery disease   . DVT (deep venous thrombosis) (HCC) 1983   LLE  . GERD (gastroesophageal reflux disease)   . History of hiatal hernia   . Hypertension     Assessment:  10 YOM presenting with SOB and worsening HF and found to have RLE DVT. Patient has a history of LLE DVT and LV thrombus. Was not on anticoagulation PTA given recent retroperitoneal bleed in Feb 2020. Pharmacy asked to dose heparin.  Heparin level (0.5) at high end of therapeutic on drip rate 1300 units/hr.  Hgb trending down from 11.1 to 10 today, platelets low 120 trending down. No overt bleeding or infusion issues noted.  Goal of Therapy:  Heparin level 0.3-0.5 units/ml Monitor platelets by anticoagulation protocol: Yes   Plan:  Decrease heparin to 1250  units/hr Daily heparin level, CBC Monitor for signs of bleeding  Richardine Service, PharmD PGY1 Pharmacy Resident Phone: [phone] 10/24/2019  11:40 AM  Please check [email or web address] for unit-specific pharmacy phone numbers.

## 2019-10-24 NOTE — Progress Notes (Signed)
ANTICOAGULATION CONSULT NOTE   Pharmacy Consult for Heparin  Indication: DVT  No Known Allergies  Patient Measurements: Height: 6\' 1"  (185.4 cm) Weight: 171 lb (77.6 kg) IBW/kg (Calculated) : 79.9  Vital Signs: Temp: 98.4 F (36.9 C) (11/20 1530) Temp Source: Oral (11/20 1530) BP: 95/62 (11/20 1530) Pulse Rate: 80 (11/20 1530)  Labs: Recent Labs    10/23/19 1048 10/23/19 1115 10/23/19 1257 10/23/19 1917 10/24/19 0420  HGB 11.1*  --   --   --  10.0*  HCT 36.5*  --   --   --  32.2*  PLT 140*  --   --   --  120*  APTT  --  28  --   --   --   LABPROT  --  17.3*  --   --   --   INR  --  1.4*  --   --   --   HEPARINUNFRC  --   --   --  0.41 0.50  CREATININE 3.07*  --   --   --  2.89*  TROPONINIHS 96*  --  90*  --   --     Estimated Creatinine Clearance: 23.1 mL/min (A) (by C-G formula based on SCr of 2.89 mg/dL (H)).   Medical History: Past Medical History:  Diagnosis Date  . AICD (automatic cardioverter/defibrillator) present   . Cancer of left kidney ([place])    S/P OR 05/2014  . CHF (congestive heart failure) ([place])   . CKD (chronic kidney disease)   . 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 Coronary artery disease   . DVT (deep venous thrombosis) (HCC) 1983   LLE  . GERD (gastroesophageal reflux disease)   . History of hiatal hernia   . Hypertension     Assessment:  86 YOM presenting with SOB and worsening HF and found to have RLE DVT. Patient has a history of LLE DVT and LV thrombus. Was not on anticoagulation PTA given recent retroperitoneal bleed in Feb 2020. Pharmacy asked to dose heparin.  -RN called tonight to report the IV line was leaking and patient needed a new line. Upon removing the old line the RN reported profuse bleeding that ha snow resolved.    Goal of Therapy:  Heparin level 0.3-0.5 units/ml Monitor platelets by anticoagulation protocol: Yes   Plan:  -Decrease heparin to 1100 units/hr -Recheck heparin  level in am  Hildred Laser, PharmD Clinical Pharmacist **Pharmacist phone directory can now be found on [email or web address] (PW [email or web address]).  Listed under [place].

## 2019-10-24 NOTE — Consult Note (Signed)
Cardiology Consultation:   Patient ID: Spencer Rice MRN: [id number]; DOB: 28-Feb-1941  Admit date: 10/23/2019 Date of Consult: 10/24/2019  Primary Care Provider: [name]., MD Primary Cardiologist: No primary care provider on file.  Primary Electrophysiologist:  None    Patient Profile:   Spencer Rice is a 78 y.o. 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 Incidental finding of acute RLE DVT.   History of Present Illness:   Spencer Rice was admitted with cardiogenic shock in 12/2018 and milrinone initiated at that time. Initial plan was for DT LVAD, but patient developed spontaneous RP bleed which called into question whether he could tolerate long term anticoagulation. Since then, he has been followed closely on home milrinone and has generally done well. He is independently in his ADLs, has been able to manage his volume status with changes in torsemide dosing and PRN metolazone. He thinks his dry weight during this time is around 168 lbs.   [place] 01/2019: CVP 7, mPA 41, PCWP 19, CI 2.7, PVR 3.9, PAPi 5.6.   The patient had a video visit with Dr. Sung Amabile last week after which he reduced his torsemide to 40mg  daily (from BID) with plans to restart BID dosing today. During this time, he noted rapid increase in LE edema and SOB for which he presented to [facility] earlier today. Labs on admission notable for:   Scr 3.07 (from 2-2.4 baseline) BUN 71 AST 45 TBili 2.7 BNP 3200 (up from 2500) COVID negative.   He was given 80mg  IV lasix with excellent UOP ("I feel like a new man").   Of note, weight at home on day of admission reportedly close to presumed dry weight of 168/169 lbs.    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   Coronary artery disease    DVT (deep venous thrombosis) (HCC) 1983   LLE   GERD (gastroesophageal reflux disease)    History of hiatal hernia    Hypertension     Past Surgical History:  Procedure Laterality Date   CARDIAC CATHETERIZATION  1980's   IMPLANTABLE CARDIOVERTER DEFIBRILLATOR IMPLANT  07/21/2010   IR FLUORO GUIDE CV LINE RIGHT  01/23/2019   IR GENERIC HISTORICAL  12/23/2014   IR RADIOLOGIST EVAL & MGMT 12/23/2014 [name], MD [facility] RAD   IR GENERIC HISTORICAL  07/20/2016   IR RADIOLOGIST EVAL & MGMT 07/20/2016 [facility]   IR RADIOLOGIST EVAL & MGMT  07/04/2017   IR RADIOLOGIST EVAL & MGMT  07/08/2019   IR RADIOLOGIST EVAL & MGMT  09/16/2019   IR US GUIDE VASC ACCESS RIGHT  01/23/2019   RENAL CRYOABLATION Left 05/2014   RIGHT/LEFT HEART CATH AND CORONARY ANGIOGRAPHY N/A 01/07/2019   Procedure: RIGHT/LEFT HEART CATH AND CORONARY ANGIOGRAPHY;  Surgeon: Jolaine Artist, MD;  Location: [facility];  Service: Cardiovascular;  Laterality: N/A;     Home Medications:  Prior to Admission medications   Medication Sig Start Date End Date Taking? Authorizing Provider  acetaminophen (TYLENOL) 325 MG tablet Take  2 tablets (650 mg total) by mouth every 4 (four) hours as needed for headache or mild pain. 01/29/19   Shirley Friar, PA-C  amiodarone (PACERONE) 200 MG tablet TAKE 1 TABLET BY MOUTH EVERY DAY 10/02/19   Bensimhon, Shaune Pascal, MD  benzonatate (TESSALON) 100 MG capsule Take 1 capsule (100 mg total) by mouth every 8 (eight) hours. 02/14/19   Hennie Duos, MD  lactobacillus (FLORANEX/LACTINEX) PACK Take 1 packet (1 g total) by mouth 3 (three) times daily with meals. 02/14/19   Hennie Duos, MD  metolazone (ZAROXOLYN) 2.5 MG tablet Take 1 tablet (2.5 mg  total) by mouth once a week. Every Friday. 06/16/19 10/22/19  Bensimhon, Shaune Pascal, MD  milrinone (PRIMACOR) 20 MG/100 ML SOLN infusion Inject 0.0205 mg/min into the vein continuous. 02/14/19   Hennie Duos, MD  milrinone [facility]) 20 MG/100 ML SOLN infusion Inject into the vein. 02/14/19   [provider]  mometasone-formoterol (DULERA) 200-5 MCG/ACT AERO Inhale 2 puffs into the lungs 2 (two) times daily. 02/14/19   Hennie Duos, MD  OXYGEN Inhale 2 L into the lungs continuous.    [provider]  pantoprazole (PROTONIX) 40 MG tablet Take 1 tablet (40 mg total) by mouth daily. 08/23/19   Strader, Fransisco Hertz, PA-C  polyethylene glycol (MIRALAX / GLYCOLAX) packet Take 17 g by mouth daily. 02/14/19   Hennie Duos, MD  potassium chloride SA (KLOR-CON M20) 20 MEQ tablet Take 2 tablets (40 mEq total) by mouth 2 (two) times daily. 09/23/19   Bensimhon, Shaune Pascal, MD  spironolactone (ALDACTONE) 25 MG tablet TAKE 0.5 TABLETS (12.5 MG TOTAL) BY MOUTH AT BEDTIME. 02/18/19   Bensimhon, Shaune Pascal, MD  sucralfate (CARAFATE) 1 g tablet Take 1 tablet (1 g total) by mouth 4 (four) times daily -  with meals and at bedtime. 02/14/19   Hennie Duos, MD  tamsulosin (FLOMAX) 0.4 MG CAPS capsule Take 2 capsules (0.8 mg total) by mouth daily after supper. 02/14/19   Hennie Duos, MD  torsemide (DEMADEX) 20 MG tablet Take 2 tablets (40 mg total) by mouth 2 (two) times daily. 08/22/19   Darrick Grinder D, NP    Inpatient Medications: Scheduled Meds:  amiodarone  200 mg Oral Daily   mometasone-formoterol  2 puff Inhalation BID   pantoprazole  40 mg Oral Daily   sodium chloride flush  3 mL Intravenous Q12H   sodium chloride flush  3 mL Intravenous Q12H   tamsulosin  0.4 mg Oral QPC supper   Continuous Infusions:  sodium chloride     heparin 1,300 Units/hr (10/23/19 1404)   milrinone 0.25 mcg/kg/min (10/24/19 0047)   PRN Meds: sodium chloride, acetaminophen **OR** acetaminophen,  HYDROcodone-acetaminophen, ondansetron **OR** ondansetron (ZOFRAN) IV, polyethylene glycol, sodium chloride flush  Allergies:   No Known Allergies  Social History:   Social History   Socioeconomic History   Marital status: Married    Spouse name: Not on file   Number of children: Not on file   Years of education: Not on file   Highest education level: Not on file  Occupational History   Not on file  Social Needs   Financial resource strain: Not on file   Food insecurity    Worry: Not on file    Inability: Not on file   Transportation needs    Medical: Not on file    Non-medical: Not on file  Tobacco Use   Smoking status: Former Smoker  Packs/day: 0.75    Years: 17.00    Pack years: 12.75    Types: Cigarettes    Start date: 05/08/1959    Quit date: 06/20/1976    Years since quitting: 43.3   Smokeless tobacco: Never Used  Substance and Sexual Activity   Alcohol use: No   Drug use: No   Sexual activity: Not on file  Lifestyle   Physical activity    Days per week: Not on file    Minutes per session: Not on file   Stress: Not on file  Relationships   Social connections    Talks on phone: Not on file    Gets together: Not on file    Attends religious service: Not on file    Active member of club or organization: Not on file    Attends meetings of clubs or organizations: Not on file    Relationship status: Not on file   Intimate partner violence    Fear of current or ex partner: Not on file    Emotionally abused: Not on file    Physically abused: Not on file    Forced sexual activity: Not on file  Other Topics Concern   Not on file  Social History Narrative   Not on file    Family History:    Family History  Problem Relation Age of Onset   Heart disease Father    Heart attack Father 4   Hypertension Father    Hypertension Mother      Review of Systems: [y] = yes, [ ]  = no     General: Weight gain [ ] ; Weight loss [ ] ; Anorexia [  ]; Fatigue [ ] ; Fever [ ] ; Chills [ ] ; Weakness [ ]    Cardiac: Chest pain/pressure [ ] ; Resting SOB [ ] ; Exertional SOB [ ] ; Orthopnea [ ] ; Pedal Edema [ ] ; Palpitations [ ] ; Syncope [ ] ; Presyncope [ ] ; Paroxysmal nocturnal dyspnea[ ]    Pulmonary: Cough [ ] ; Wheezing[ ] ; Hemoptysis[ ] ; Sputum [ ] ; Snoring [ ]    GI: Vomiting[ ] ; Dysphagia[ ] ; Melena[ ] ; Hematochezia [ ] ; Heartburn[ ] ; Abdominal pain [ ] ; Constipation [ ] ; Diarrhea [ ] ; BRBPR [ ]    GU: Hematuria[ ] ; Dysuria [ ] ; Nocturia[ ]    Vascular: Pain in legs with walking [ ] ; Pain in feet with lying flat [ ] ; Non-healing sores [ ] ; Stroke [ ] ; TIA [ ] ; Slurred speech [ ] ;   Neuro: Headaches[ ] ; Vertigo[ ] ; Seizures[ ] ; Paresthesias[ ] ;Blurred vision [ ] ; Diplopia [ ] ; Vision changes [ ]    Ortho/Skin: Arthritis [ ] ; Joint pain [ ] ; Muscle pain [ ] ; Joint swelling [ ] ; Back Pain [ ] ; Rash [ ]    Psych: Depression[ ] ; Anxiety[ ]    Heme: Bleeding problems [ ] ; Clotting disorders [ ] ; Anemia [ ]    Endocrine: Diabetes [ ] ; Thyroid dysfunction[ ]   Physical Exam/Data:   Vitals:   10/23/19 2000 10/23/19 2030 10/23/19 2203 10/24/19 0116  BP: 103/76 104/70 102/69 95/65  Pulse:  85 87 85  Resp: (!) 24 (!) 21 20 20   Temp:   97.7 F (36.5 C) 98.1 F (36.7 C)  TempSrc:   Oral Oral  SpO2:  100% 100% 100%  Weight:      Height:        Intake/Output Summary (Last 24 hours) at 10/24/2019 0129 Last data filed at 10/24/2019 0100 Gross per 24 hour  Intake 462 ml  Output 575 ml  Net -113  ml   Filed Weights   10/23/19 1031  Weight: 80.2 kg   Body mass index is 23.31 kg/m.  General:  Well nourished, well developed, in no acute distress HEENT: normal Lymph: no adenopathy Neck: JVD to earlobe with engorged EJ. ++ TR.  Endocrine:  No thryomegaly Vascular: RA pulses 1+ bilaterally.  Cardiac:  normal S1, S2; RRR; III/IV systolic murmur at apex. R chest PICC.  Lungs:  clear to auscultation bilaterally, no wheezing, rhonchi or rales    Abd: soft, nontender, no hepatomegaly  Ext: edema 1+ R > trace L.  Musculoskeletal:  No deformities, BUE and BLE strength normal and equal Skin: warm and dry  Neuro:  CNs 2-12 intact, no focal abnormalities noted Psych:  Normal affect   EKG:  The EKG was personally reviewed and demonstrates:  ST with IVCD (similar to prior).   Relevant CV Studies: TTE 10/22/19: IMPRESSIONS    1. Left ventricular ejection fraction, by visual estimation, is 5-10%. The left ventricle has severely decreased function. There is no left ventricular hypertrophy.  2. Left ventricular diastolic parameters are consistent with Grade III diastolic dysfunction (restrictive).  3. Severely dilated left ventricular internal cavity size.  4. Global right ventricle has moderately reduced systolic function.The right ventricular size is mildly enlarged. No increase in right ventricular wall thickness.  5. Left atrial size was severely dilated.  6. Right atrial size was severely dilated.  7. The mitral valve is abnormal. Severe mitral valve regurgitation.  8. The tricuspid valve is normal in structure. Tricuspid valve regurgitation is severe.  9. The aortic valve is tricuspid. Aortic valve regurgitation is not visualized. Mild aortic valve sclerosis without stenosis. 10. The pulmonic valve was grossly normal. Pulmonic valve regurgitation is trivial. 11. Moderately elevated pulmonary artery systolic pressure. 12. A pacer wire is visualized. 13. The inferior vena cava is dilated in size with >50% respiratory variability, suggesting right atrial pressure of 8 mmHg.  [place] 01/2019: Done on milrinone 0.25 mcg/kg/min  RA = 7  RV = 62/8 PA = 65/26 (41) PCW = 19 Fick cardiac output/index = 5.6/2.7 PVR = 3.9 WU FA sat = 95% PA sat = 65%, 66% PaPI = 5.6  ABG on 2L:  7.50/40/67/95%  Assessment: 1. Mild non-obstructive CAD 2. Moderate PAH with normal PAPI 3. Normal CO on milrinone  Laboratory  Data:  Chemistry Recent Labs  Lab 10/23/19 1048  NA 135  K 5.1  CL 99  CO2 25  GLUCOSE 113*  BUN 71*  CREATININE 3.07*  CALCIUM 9.6  GFRNONAA 18*  GFRAA 21*  ANIONGAP 11    Recent Labs  Lab 10/23/19 1048  PROT 7.7  ALBUMIN 3.7  AST 45*  ALT 21  ALKPHOS 110  BILITOT 2.7*   Hematology Recent Labs  Lab 10/23/19 1048  WBC 6.5  RBC 4.97  HGB 11.1*  HCT 36.5*  MCV 73.4*  MCH 22.3*  MCHC 30.4  RDW 19.3*  PLT 140*   Cardiac EnzymesNo results for input(s): TROPONINI in the last 168 hours. No results for input(s): TROPIPOC in the last 168 hours.  BNP Recent Labs  Lab 10/23/19 1048  BNP 3,200.7*    DDimer No results for input(s): DDIMER in the last 168 hours.  Radiology/Studies:  US Venous Img Lower Right (dvt Study)  Result Date: 10/23/2019 CLINICAL DATA:  Leg swelling EXAM: RIGHT LOWER EXTREMITY VENOUS DOPPLER ULTRASOUND TECHNIQUE: Gray-scale sonography with compression, as well as color and duplex ultrasound, were performed to evaluate the deep venous  system from the level of the common femoral vein through the popliteal and proximal calf veins. COMPARISON:  None FINDINGS: Echogenic occlusive thrombus in the popliteal vein, extending into the peroneal and posterior tibial veins. Normal compressibility of the common femoral and superficial femoral veins . Visualized segments of the saphenous venous system normal in caliber and compressibility. Survey views of the contralateral common femoral vein are unremarkable. IMPRESSION: 1. POSITIVE for occlusive DVT in the right posterior tibial, peroneal, and popliteal veins. Electronically Signed   By: Lucrezia Europe M.D.   On: 10/23/2019 13:20   Dg Chest Port 1 View  Result Date: 10/23/2019 CLINICAL DATA:  Shortness of breath and dry cough. EXAM: PORTABLE CHEST 1 VIEW COMPARISON:  09/25/2019 FINDINGS: Cardiomegaly. Biventricular pacer/ICD from the left. Central line on the right with tip at the [facility]. There is no edema,  consolidation, effusion, or pneumothorax. IMPRESSION: No evidence of active disease. Cardiomegaly. Electronically Signed   By: Monte Fantasia M.D.   On: 10/23/2019 11:11    Assessment and Plan:   Spencer Rice is a 78 year old man with end-stage NICM who presents with increasing SOB and LE edema found to have new AKI and abnormal Tbili suggestive of worsening renal and hepatic congestion. He received 80mg  IV lasix in the ER with excellent UOP and marked improvement in his symptoms. Incidentally, he was also found to have a RLE DVT for which IV heparin has been initiated.   NYHA IV/ ACC D NICM - Continue home dose of Milrinone @ 0.25 - Continue amiodarone at current dose.  - Would await results of repeat Scr to determine diuretic dosing. If Cr improving with decongestion, would plan for 80mg  IV in the AM with goal net negative 1-2 L over the next 24 hours.  - If Scr is worsening despite symptom improvement and diuresis, we may need to consider further increasing his inotropic support to provide adequate renal perfusion while we diurese.  - Checking co-ox from PICC will be of great utility here.  - Please page cardiology with AM creatinine results and Co-ox to determine diuretic dosing and whether adjustments to his milrinone dosing are needed.   - Please keep NPO at midnight in case of possible RHC - Ensure K > 2, MG > 4 while we are diuresing. Needs BID BMP for electrolytes and renal function.  - Daily weights; suspect he has some cardiac cachexia and his actual dry weight is lower than previous.   Heart failure team to see in the AM.     For questions or updates, please contact [place] Please consult [email or web address] for contact info under    Signed, Milus Banister, MD  10/24/2019 1:29 AM

## 2019-10-24 NOTE — Progress Notes (Signed)
PROGRESS NOTE    Spencer Rice  FSE:[id number] DOB: 1941-01-07 DOA: 10/23/2019 PCP: [name]., MD    Brief Narrative:  78 y.o. 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  Patient reports that he felt slightly more short of breath than usual for the past couple days, but then worsened significantly overnight last night.  His dyspnea persisted through the morning and he went to the ED for evaluation.  Reports that his weight has been stable, denies fevers, reports increased leg swelling on the right, but denies any leg tenderness or chest pain.  He denies any dietary indiscretion.  Diuretic was reduced recently due to worsening renal function.  Assessment & Plan:   Principal Problem:   Deep vein thrombosis (DVT) of right lower extremity (HCC) Active Problems:   Chronic combined systolic and diastolic CHF (congestive heart failure) (HCC)   Acute renal failure superimposed on stage 3a chronic kidney disease ([place])   1. Right leg DVT  - Presents with SOB and increased leg swelling Rt>Lt, and is found to have RLE DVT  - He has hx of LLE DVT and mural thrombus, and had been anticoagulated until retroperitoneal bleed early this year - Pt was started on IV heparin in ED  - Pt is continued on IV heparin infusion, monitor for bleeding, follow daily CBC   2. Acute kidney injury superimposed on CKD III  - SCr is 3.07 on admission, up from 2.0 in September 2020  - Renal US reviewed, no evidence of obstructive disease -Repeat bmet in AM  3. Chronic combined systolic & diastolic CHF; NSVT  - Presents with increased SOB, denies wt gain, no edema  noted on CXR, ?SOB secondary to PE in light of his DVT and clear CXR but he reports improvement after Lasix 80 mg IV in ED  - EF was 5-10% on 10/22/19 and he is dependent on milrinone infusion  - He has NSVT, on amiodarone, ICD in place  - Cardiology following. Milrinone increased per Heart failure team -Palliative Care is recommended by Cardiology, I agree given poor prognosis. Have discussed case with Palliative Care - Defer further diuretic dosing to cardiology, SLIV, fluid-restrict diet, follow daily wt and I/O's   4. COPD  - No cough or wheezing on admission  - Continue Dulera   -Currently on minimal O2 support   DVT prophylaxis: heparin gtt Code Status: Full Family Communication: Pt in room, family not at bedside Disposition Plan: Uncertain at this time  Consultants:   Cardiology  Palliative Care  Procedures:     Antimicrobials: Anti-infectives (From admission, onward)   None       Subjective: Without active complaints as pt was eating lunch  Objective: Vitals:   10/24/19 0517 10/24/19 0627 10/24/19 0753 10/24/19 1041  BP: 97/73  98/60 106/72  Pulse: 87  83 86  Resp: 20  20   Temp: 98.4 F (36.9 C)  99 F (37.2 C)   TempSrc: Oral  Oral   SpO2: 100%  100%   Weight:  77.6 kg    Height:        Intake/Output Summary (Last 24 hours) at 10/24/2019 1509 Last data filed at 10/24/2019 1300  Gross per 24 hour  Intake 1203.45 ml  Output 1350 ml  Net -146.55 ml   Filed Weights   10/23/19 1031 10/23/19 2203 10/24/19 0627  Weight: 80.2 kg 77.7 kg 77.6 kg    Examination:  General exam: Appears calm and comfortable  Respiratory system: Clear to auscultation. Slightly increased resp effort Cardiovascular system: S1 & S2 heard, RRR Gastrointestinal system: Abdomen is nondistended, soft and nontender. No organomegaly or masses felt. Normal bowel sounds heard. Central nervous system: Alert and oriented. No focal neurological deficits. Extremities: Symmetric  5 x 5 power. Skin: No rashes, lesions Psychiatry: Judgement and insight appear normal. Mood & affect appropriate.   Data Reviewed: I have personally reviewed following labs and imaging studies  CBC: Recent Labs  Lab 10/23/19 1048 10/24/19 0420  WBC 6.5 7.4  NEUTROABS 4.8  --   HGB 11.1* 10.0*  HCT 36.5* 32.2*  MCV 73.4* 71.9*  PLT 140* 161*   Basic Metabolic Panel: Recent Labs  Lab 10/23/19 1048 10/24/19 0420  NA 135 138  K 5.1 4.1  CL 99 99  CO2 25 25  GLUCOSE 113* 110*  BUN 71* 67*  CREATININE 3.07* 2.89*  CALCIUM 9.6 9.2  MG  --  2.5*   GFR: Estimated Creatinine Clearance: 23.1 mL/min (A) (by C-G formula based on SCr of 2.89 mg/dL (H)). Liver Function Tests: Recent Labs  Lab 10/23/19 1048 10/24/19 0420  AST 45* 33  ALT 21 18  ALKPHOS 110 103  BILITOT 2.7* 2.4*  PROT 7.7 6.5  ALBUMIN 3.7 3.1*   No results for input(s): LIPASE, AMYLASE in the last 168 hours. No results for input(s): AMMONIA in the last 168 hours. Coagulation Profile: Recent Labs  Lab 10/23/19 1115  INR 1.4*   Cardiac Enzymes: No results for input(s): CKTOTAL, CKMB, CKMBINDEX, TROPONINI in the last 168 hours. BNP (last 3 results) No results for input(s): PROBNP in the last 8760 hours. HbA1C: No results for input(s): HGBA1C in the last 72 hours. CBG: No results for input(s): GLUCAP in the last 168 hours. Lipid Profile: No results for input(s): CHOL, HDL, LDLCALC, TRIG, CHOLHDL, LDLDIRECT in the last 72 hours. Thyroid Function Tests: No results for input(s): TSH, T4TOTAL, FREET4, T3FREE, THYROIDAB in the last 72 hours. Anemia Panel: No results for input(s): VITAMINB12, FOLATE, FERRITIN, TIBC, IRON, RETICCTPCT in the last 72 hours. Sepsis Labs: No results for input(s): PROCALCITON, LATICACIDVEN in the last 168 hours.  Recent Results (from the past 240 hour(s))  SARS CORONAVIRUS 2 (TAT 6-24 HRS) Nasopharyngeal Nasopharyngeal Swab     Status: None   Collection Time: 10/23/19  2:06  PM   Specimen: Nasopharyngeal Swab  Result Value Ref Range Status   SARS Coronavirus 2 NEGATIVE NEGATIVE Final    Comment: (NOTE) SARS-CoV-2 target nucleic acids are NOT DETECTED. The SARS-CoV-2 RNA is generally detectable in upper and lower respiratory specimens during the acute phase of infection. Negative results do not preclude SARS-CoV-2 infection, do not rule out co-infections with other pathogens, and should not be used as the sole basis for treatment or other patient management decisions. Negative results must be combined with clinical observations, patient history, and epidemiological information. The expected result is Negative. Fact Sheet for Patients: SugarRoll.be Fact Sheet for Healthcare Providers: https://www.woods-mathews.com/ This test is not yet approved or cleared by the [place] FDA and  has been authorized for detection and/or diagnosis of SARS-CoV-2 by FDA under an Emergency Use Authorization (EUA). This EUA will remain  in effect (meaning this  test can be used) for the duration of the COVID-19 declaration under Section 56 4(b)(1) of the Act, 21 U.S.C. section 360bbb-3(b)(1), unless the authorization is terminated or revoked sooner. Performed at [facility], [address], [place], [place] [address]      Radiology Studies: US Renal  Result Date: 10/24/2019 CLINICAL DATA:  Acute renal failure superimposed on stage 3 chronic kidney disease. 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 EXAM: RENAL / URINARY TRACT ULTRASOUND COMPLETE COMPARISON:  Noncontrast abdominal CT 09/08/2019 FINDINGS: Right Kidney: Renal measurements: 9.6 x 4.2 x 4.7 cm = volume: 100 mL. Cyst in the central upper kidney on prior CT is not well demonstrated sonographically. Mild increased renal echogenicity. No hydronephrosis. No solid lesion visualized. Left Kidney: Renal measurements: 10 x 5.2 x 4.5 cm =  volume: 122 mL. Simple cyst in the lower kidney measuring 2.3 cm. The ablation defect is not well visualized sonographically. No hydronephrosis. Bladder: Partially distended.  Wall thickening of 1 cm. Other: None. IMPRESSION: 1. No hydronephrosis or obstructive uropathy. 2. Left renal cyst. Ablation site left kidney on prior CT is not well demonstrated sonographically. 3. Bladder wall thickening may be due to nondistention. Electronically Signed   By: Keith Rake M.D.   On: 10/24/2019 05:17   US Venous Img Lower Right (dvt Study)  Result Date: 10/23/2019 CLINICAL DATA:  Leg swelling EXAM: RIGHT LOWER EXTREMITY VENOUS DOPPLER ULTRASOUND TECHNIQUE: Gray-scale sonography with compression, as well as color and duplex ultrasound, were performed to evaluate the deep venous system from the level of the common femoral vein through the popliteal and proximal calf veins. COMPARISON:  None FINDINGS: Echogenic occlusive thrombus in the popliteal vein, extending into the peroneal and posterior tibial veins. Normal compressibility of the common femoral and superficial femoral veins . Visualized segments of the saphenous venous system normal in caliber and compressibility. Survey views of the contralateral common femoral vein are unremarkable. IMPRESSION: 1. POSITIVE for occlusive DVT in the right posterior tibial, peroneal, and popliteal veins. Electronically Signed   By: Lucrezia Europe M.D.   On: 10/23/2019 13:20   Dg Chest Port 1 View  Result Date: 10/23/2019 CLINICAL DATA:  Shortness of breath and dry cough. EXAM: PORTABLE CHEST 1 VIEW COMPARISON:  09/25/2019 FINDINGS: Cardiomegaly. Biventricular pacer/ICD from the left. Central line on the right with tip at the [facility]. There is no edema, consolidation, effusion, or pneumothorax. IMPRESSION: No evidence of active disease. Cardiomegaly. Electronically Signed   By: Monte Fantasia M.D.   On: 10/23/2019 11:11    Scheduled Meds:  amiodarone  200 mg Oral Daily    Chlorhexidine Gluconate Cloth  6 each Topical Daily   furosemide  80 mg Intravenous BID   mometasone-formoterol  2 puff Inhalation BID   pantoprazole  40 mg Oral Daily   sodium chloride flush  10-40 mL Intracatheter Q12H   sodium chloride flush  3 mL Intravenous Q12H   sodium chloride flush  3 mL Intravenous Q12H   tamsulosin  0.4 mg Oral QPC supper   Continuous Infusions:  sodium chloride     heparin 1,300 Units/hr (10/24/19 0634)   milrinone 0.375 mcg/kg/min (10/24/19 1246)     LOS: 1 day   Marylu Lund, MD [facility] Pager On Amion  If 7PM-7AM, please contact night-coverage 10/24/2019, 3:09 PM

## 2019-10-24 NOTE — Progress Notes (Addendum)
Advanced Heart Failure Rounding Note  PCP-Cardiologist: No primary care provider on file.   Subjective:    Admitted with A/C systolic HF with recurrent low output heart failure despite milrinone 0.25 mcg.  Given 80 mg x1.   CO-OX 43%--repeat CO-OX 43%.  CVP 12 (checked personally)  Feeling a little better today. Denies SOB.    Objective:   Weight Range: 77.6 kg Body mass index is 22.56 kg/m.   Vital Signs:   Temp:  [97.7 F (36.5 C)-99 F (37.2 C)] 99 F (37.2 C) (11/20 0753) Pulse Rate:  [79-88] 83 (11/20 0753) Resp:  [13-26] 20 (11/20 0753) BP: (95-108)/(60-93) 98/60 (11/20 0753) SpO2:  [100 %] 100 % (11/20 0753) Weight:  [77.6 kg-80.2 kg] 77.6 kg (11/20 0627)    Weight change: Filed Weights   10/23/19 1031 10/23/19 2203 10/24/19 0627  Weight: 80.2 kg 77.7 kg 77.6 kg    Intake/Output:   Intake/Output Summary (Last 24 hours) at 10/24/2019 0804 Last data filed at 10/24/2019 0758 Gross per 24 hour  Intake 1203.45 ml  Output 1050 ml  Net 153.45 ml      Physical Exam    General:  Weak appearing. Sitting in the chair.  No resp difficulty HEENT: Normal Anicteric Neck: Supple. JVP to jaw.  Carotids 2+ bilat; no bruits. No lymphadenopathy or thyromegaly appreciated. Cor: PMI nondisplaced. Regular rate & rhythm. No rubs, gallops. 3/6 MR/TR  + S3  R upper chest tunneled catheter.  Lungs: Clear No wheeze Abdomen: Soft, nontender, nondistended. No hepatosplenomegaly. No bruits or masses. Good bowel sounds. Extremities: warm, no cyanosis, clubbing, rash, L>R LLE 1+ edema RLE trace  Neuro: Alert & orientedx3, cranial nerves grossly intact. moves all 4 extremities w/o difficulty. Affect pleasant   Telemetry   SR 80-90 Personally reviewed  EKG    N/A  Labs    CBC Recent Labs    10/23/19 1048 10/24/19 0420  WBC 6.5 7.4  NEUTROABS 4.8  --   HGB 11.1* 10.0*  HCT 36.5* 32.2*  MCV 73.4* 71.9*  PLT 140* 326*   Basic Metabolic Panel Recent Labs   10/23/19 1048 10/24/19 0420  NA 135 138  K 5.1 4.1  CL 99 99  CO2 25 25  GLUCOSE 113* 110*  BUN 71* 67*  CREATININE 3.07* 2.89*  CALCIUM 9.6 9.2  MG  --  2.5*   Liver Function Tests Recent Labs    10/23/19 1048 10/24/19 0420  AST 45* 33  ALT 21 18  ALKPHOS 110 103  BILITOT 2.7* 2.4*  PROT 7.7 6.5  ALBUMIN 3.7 3.1*   No results for input(s): LIPASE, AMYLASE in the last 72 hours. Cardiac Enzymes No results for input(s): CKTOTAL, CKMB, CKMBINDEX, TROPONINI in the last 72 hours.  BNP: BNP (last 3 results) Recent Labs    02/06/19 1850 08/21/19 2013 10/23/19 1048  BNP 2,172.9* 2,515.0* 3,200.7*    ProBNP (last 3 results) No results for input(s): PROBNP in the last 8760 hours.   D-Dimer No results for input(s): DDIMER in the last 72 hours. Hemoglobin A1C No results for input(s): HGBA1C in the last 72 hours. Fasting Lipid Panel No results for input(s): CHOL, HDL, LDLCALC, TRIG, CHOLHDL, LDLDIRECT in the last 72 hours. Thyroid Function Tests No results for input(s): TSH, T4TOTAL, T3FREE, THYROIDAB in the last 72 hours.  Invalid input(s): FREET3  Other results:   Imaging    US Renal  Result Date: 10/24/2019 CLINICAL DATA:  Acute renal failure superimposed on stage 3  chronic kidney disease. 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 EXAM: RENAL / URINARY TRACT ULTRASOUND COMPLETE COMPARISON:  Noncontrast abdominal CT 09/08/2019 FINDINGS: Right Kidney: Renal measurements: 9.6 x 4.2 x 4.7 cm = volume: 100 mL. Cyst in the central upper kidney on prior CT is not well demonstrated sonographically. Mild increased renal echogenicity. No hydronephrosis. No solid lesion visualized. Left Kidney: Renal measurements: 10 x 5.2 x 4.5 cm = volume: 122 mL. Simple cyst in the lower kidney measuring 2.3 cm. The ablation defect is not well visualized sonographically. No hydronephrosis. Bladder: Partially distended.  Wall thickening of 1 cm. Other: None.  IMPRESSION: 1. No hydronephrosis or obstructive uropathy. 2. Left renal cyst. Ablation site left kidney on prior CT is not well demonstrated sonographically. 3. Bladder wall thickening may be due to nondistention. Electronically Signed   By: Keith Rake M.D.   On: 10/24/2019 05:17   US Venous Img Lower Right (dvt Study)  Result Date: 10/23/2019 CLINICAL DATA:  Leg swelling EXAM: RIGHT LOWER EXTREMITY VENOUS DOPPLER ULTRASOUND TECHNIQUE: Gray-scale sonography with compression, as well as color and duplex ultrasound, were performed to evaluate the deep venous system from the level of the common femoral vein through the popliteal and proximal calf veins. COMPARISON:  None FINDINGS: Echogenic occlusive thrombus in the popliteal vein, extending into the peroneal and posterior tibial veins. Normal compressibility of the common femoral and superficial femoral veins . Visualized segments of the saphenous venous system normal in caliber and compressibility. Survey views of the contralateral common femoral vein are unremarkable. IMPRESSION: 1. POSITIVE for occlusive DVT in the right posterior tibial, peroneal, and popliteal veins. Electronically Signed   By: Lucrezia Europe M.D.   On: 10/23/2019 13:20   Dg Chest Port 1 View  Result Date: 10/23/2019 CLINICAL DATA:  Shortness of breath and dry cough. EXAM: PORTABLE CHEST 1 VIEW COMPARISON:  09/25/2019 FINDINGS: Cardiomegaly. Biventricular pacer/ICD from the left. Central line on the right with tip at the [facility]. There is no edema, consolidation, effusion, or pneumothorax. IMPRESSION: No evidence of active disease. Cardiomegaly. Electronically Signed   By: Monte Fantasia M.D.   On: 10/23/2019 11:11      Medications:     Scheduled Medications:  amiodarone  200 mg Oral Daily   Chlorhexidine Gluconate Cloth  6 each Topical Daily   mometasone-formoterol  2 puff Inhalation BID   pantoprazole  40 mg Oral Daily   sodium chloride flush  10-40 mL Intracatheter  Q12H   sodium chloride flush  3 mL Intravenous Q12H   sodium chloride flush  3 mL Intravenous Q12H   tamsulosin  0.4 mg Oral QPC supper     Infusions:  sodium chloride     heparin 1,300 Units/hr (10/24/19 0634)   milrinone       PRN Medications:  sodium chloride, acetaminophen **OR** acetaminophen, HYDROcodone-acetaminophen, ondansetron **OR** ondansetron (ZOFRAN) IV, polyethylene glycol, sodium chloride flush, sodium chloride flush    Assessment/Plan   1. 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 [place] 2015 with moderate disease. Has [employer] BiV ICD. - ECHO 10/22/19 EF severely EF 5-10% with severe MR/TR, biatrial enlargement, RV with moderately reduced function - Admitted with volume overload and recurrent low output HF despite home milrinone at 0.25 mcg.   - CO-OX today 43% on milrinone 0.25 mcg. Repeat CO-OX 43%. Increase milrinone 0.375 mcg.  - Set up CVP. Increase lasix to 80 mg IV twice daily.  SBP low. No room for hydralazine/imdur.  - No bb with low output - No sprio, dig, arb with AKI - Follow renal function closely.   2. AKI on CKD Stage IIIb Creatinine baseline 1.8-2.2 Todays creatinine is 2.8  Follow creatinine daily.   3. H/O RP bleed  - was on [facility] due to LV clot but stopped.   4. Acute RLE DVT - continue heparin. Can switch to Eliquis   Needs palliative care for goals of care.   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 On home milrinone. Had w/u for VAD last year but deferred duet o large RP bleed. Now on home milrinone at 0.25.   Readmitted with low output symptoms, AKI and RLE DVT.   Co-ox 43% CVP 12.   Agree with increasing  milrinone to 0.375. He is not candidate for advanced therapies at this point. We discussed end-of-life issues. He is willing to talk to Palliative Care. We will consult. Agree with IV lasix today.   On heparin for DVT. Can switch to Eliquis.   Glori Bickers, MD  11:50 AM

## 2019-10-25 DIAGNOSIS — I5042 Chronic combined systolic (congestive) and diastolic (congestive) heart failure: Secondary | ICD-10-CM

## 2019-10-25 DIAGNOSIS — I5043 Acute on chronic combined systolic (congestive) and diastolic (congestive) heart failure: Secondary | ICD-10-CM

## 2019-10-25 LAB — CBC
HCT: 30.6 % — ABNORMAL LOW (ref 39.0–52.0)
Hemoglobin: 9.5 g/dL — ABNORMAL LOW (ref 13.0–17.0)
MCH: 22.2 pg — ABNORMAL LOW (ref 26.0–34.0)
MCHC: 31 g/dL (ref 30.0–36.0)
MCV: 71.5 fL — ABNORMAL LOW (ref 80.0–100.0)
Platelets: 120 10*3/uL — ABNORMAL LOW (ref 150–400)
RBC: 4.28 MIL/uL (ref 4.22–5.81)
RDW: 18.9 % — ABNORMAL HIGH (ref 11.5–15.5)
WBC: 6.3 10*3/uL (ref 4.0–10.5)
nRBC: 0.3 % — ABNORMAL HIGH (ref 0.0–0.2)

## 2019-10-25 LAB — COMPREHENSIVE METABOLIC PANEL
ALT: 19 U/L (ref 0–44)
AST: 32 U/L (ref 15–41)
Albumin: 2.9 g/dL — ABNORMAL LOW (ref 3.5–5.0)
Alkaline Phosphatase: 100 U/L (ref 38–126)
Anion gap: 12 (ref 5–15)
BUN: 54 mg/dL — ABNORMAL HIGH (ref 8–23)
CO2: 27 mmol/L (ref 22–32)
Calcium: 8.9 mg/dL (ref 8.9–10.3)
Chloride: 101 mmol/L (ref 98–111)
Creatinine, Ser: 2.69 mg/dL — ABNORMAL HIGH (ref 0.61–1.24)
GFR calc Af Amer: 25 mL/min — ABNORMAL LOW (ref >60)
GFR calc non Af Amer: 22 mL/min — ABNORMAL LOW (ref >60)
Glucose, Bld: 159 mg/dL — ABNORMAL HIGH (ref 70–99)
Potassium: 3.1 mmol/L — ABNORMAL LOW (ref 3.5–5.1)
Sodium: 140 mmol/L (ref 135–145)
Total Bilirubin: 2.1 mg/dL — ABNORMAL HIGH (ref 0.3–1.2)
Total Protein: 6.3 g/dL — ABNORMAL LOW (ref 6.5–8.1)

## 2019-10-25 LAB — HEPARIN LEVEL (UNFRACTIONATED): Heparin Unfractionated: 0.25 IU/mL — ABNORMAL LOW (ref 0.30–0.70)

## 2019-10-25 LAB — COOXEMETRY PANEL
Carboxyhemoglobin: 1.8 % — ABNORMAL HIGH (ref 0.5–1.5)
Methemoglobin: 1.3 % (ref 0.0–1.5)
O2 Saturation: 52.4 %
Total hemoglobin: 10.2 g/dL — ABNORMAL LOW (ref 12.0–16.0)

## 2019-10-25 LAB — MAGNESIUM: Magnesium: 2.5 mg/dL — ABNORMAL HIGH (ref 1.7–2.4)

## 2019-10-25 MED ORDER — POTASSIUM CHLORIDE CRYS ER 20 MEQ PO TBCR
60.0000 meq | EXTENDED_RELEASE_TABLET | Freq: Once | ORAL | Status: AC
  Administered 2019-10-25: 60 meq via ORAL
  Filled 2019-10-25: qty 3

## 2019-10-25 MED ORDER — APIXABAN 5 MG PO TABS
10.0000 mg | ORAL_TABLET | Freq: Two times a day (BID) | ORAL | Status: DC
  Administered 2019-10-25 – 2019-10-29 (×9): 10 mg via ORAL
  Filled 2019-10-25 (×9): qty 2

## 2019-10-25 MED ORDER — METOLAZONE 2.5 MG PO TABS
2.5000 mg | ORAL_TABLET | Freq: Once | ORAL | Status: AC
  Administered 2019-10-25: 2.5 mg via ORAL
  Filled 2019-10-25: qty 1

## 2019-10-25 MED ORDER — POTASSIUM CHLORIDE CRYS ER 20 MEQ PO TBCR
40.0000 meq | EXTENDED_RELEASE_TABLET | Freq: Once | ORAL | Status: AC
  Administered 2019-10-25: 40 meq via ORAL
  Filled 2019-10-25: qty 2

## 2019-10-25 MED ORDER — APIXABAN 5 MG PO TABS: 5.0000 mg | ORAL_TABLET | Freq: Two times a day (BID) | ORAL | Status: DC

## 2019-10-25 NOTE — Progress Notes (Signed)
ANTICOAGULATION CONSULT NOTE   Pharmacy Consult for Apixaban Indication: DVT  No Known Allergies  Patient Measurements: Height: 6\' 1"  (185.4 cm) Weight: 169 lb (76.7 kg) IBW/kg (Calculated) : 79.9  Vital Signs: Temp: 97.5 F (36.4 C) (11/21 0901) Temp Source: Oral (11/21 0901) BP: 105/68 (11/21 0901) Pulse Rate: 81 (11/21 0901)  Labs: Recent Labs    10/23/19 1048 10/23/19 1115 10/23/19 1257 10/23/19 1917 10/24/19 0420 10/25/19 0517  HGB 11.1*  --   --   --  10.0* 9.5*  HCT 36.5*  --   --   --  32.2* 30.6*  PLT 140*  --   --   --  120* 120*  APTT  --  28  --   --   --   --   LABPROT  --  17.3*  --   --   --   --   INR  --  1.4*  --   --   --   --   HEPARINUNFRC  --   --   --  0.41 0.50 0.25*  CREATININE 3.07*  --   --   --  2.89* 2.69*  TROPONINIHS 96*  --  90*  --   --   --     Estimated Creatinine Clearance: 24.6 mL/min (A) (by C-G formula based on SCr of 2.69 mg/dL (H)).   Medical History: Past Medical History:  Diagnosis Date  . AICD (automatic cardioverter/defibrillator) present   . Cancer of left kidney ([place])    S/P OR 05/2014  . CHF (congestive heart failure) ([place])   . CKD (chronic kidney disease)   . 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 Coronary artery disease   . DVT (deep venous thrombosis) (HCC) 1983   LLE  . GERD (gastroesophageal reflux disease)   . History of hiatal hernia   . Hypertension     Assessment:  51 YOM presenting with SOB and worsening HF and found to have RLE DVT. Patient has a history of LLE DVT and LV thrombus. Was not on anticoagulation PTA given recent retroperitoneal bleed in Feb 2020. Pt was started on heparin gtt but transitioned to apixaban. Pharmacy asked to dose apixaban.  - RN reports resolution of bleeding. Hg/Hct is stable at 9.5/30.6. Pls are low at 120.  Goal of Therapy:  Monitor platelets by anticoagulation protocol: Yes   Plan:  - Stop heparin gtt  - Apixaban 10 mg BID x  6 days (pt was therapeutic on heparin for 1 day), followed by Apixaban 5 mg BID - Monitor signs/symptoms of bleeding.  - Monitor CBC daily for now  Sherren Kerns, PharmD PGY1 [place] Resident **Pharmacist phone directory can now be found on [email or web address] ([email or web address]).  Listed under [place].

## 2019-10-25 NOTE — Progress Notes (Signed)
PROGRESS NOTE    Spencer Rice  NKN:[id number] DOB: [birth year] DOA: 10/23/2019 PCP: [name]., MD    Brief Narrative:  78 y.o. 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  Patient reports that he felt slightly more short of breath than usual for the past couple days, but then worsened significantly overnight last night.  His dyspnea persisted through the morning and he went to the ED for evaluation.  Reports that his weight has been stable, denies fevers, reports increased leg swelling on the right, but denies any leg tenderness or chest pain.  He denies any dietary indiscretion.  Diuretic was reduced recently due to worsening renal function.  Assessment & Plan:   Principal Problem:   Deep vein thrombosis (DVT) of right lower extremity (HCC) Active Problems:   Chronic combined systolic and diastolic CHF (congestive heart failure) (HCC)   Acute renal failure superimposed on stage 3a chronic kidney disease ([place])   1. Right leg DVT  - Presents with SOB and increased leg swelling Rt>Lt, and is found to have RLE DVT  - He has hx of LLE DVT and mural thrombus, and had been anticoagulated until retroperitoneal bleed early this year - Pt was started on IV heparin in ED  - Pt transitioned to eliquis per Cardiology  2. Acute kidney injury superimposed on CKD III  - SCr is 3.07 on admission, up from 2.0 in September 2020  - Renal US reviewed, no evidence of obstructive disease - Stable. Will repeat bmet in AM  3. Chronic combined systolic & diastolic CHF; NSVT  - Presents with increased SOB, denies wt gain, no edema noted on CXR, ?SOB  secondary to PE in light of his DVT and clear CXR but he reports improvement after Lasix 80 mg IV in ED  - EF was 5-10% on 10/22/19 and he is dependent on milrinone infusion  - He has NSVT, on amiodarone, ICD in place  - Cardiology following. Milrinone increased per Heart failure team -Palliative Care is recommended by Cardiology. Have discussed with Palliative Care team for [place]. Will follow - Defer further diuretic dosing to cardiology, SLIV, fluid-restrict diet, follow daily wt and I/O's   4. COPD  - No cough or wheezing on admission  - Continue Dulera   -Currently on minimal O2 support at this time  DVT prophylaxis: eliquis Code Status: Full Family Communication: Pt in room, family not at bedside Disposition Plan: Uncertain at this time  Consultants:   Cardiology  Palliative Care  Procedures:     Antimicrobials: Anti-infectives (From admission, onward)   None      Subjective: No complaints this AM  Objective: Vitals:   10/25/19 0841 10/25/19 0901 10/25/19 1122 10/25/19 1132  BP: 105/68 105/68  99/84  Pulse:  81 82 86  Resp:  18 18 18   Temp:  (!) 97.5 F (36.4 C)  98.2 F (36.8 C)  TempSrc:  Oral  Oral  SpO2:  100% 99% 100%  Weight:      Height:        Intake/Output Summary (Last 24 hours) at 10/25/2019 1344 Last data filed at 10/25/2019 1135 Gross per 24 hour  Intake 1406.35 ml  Output  2210 ml  Net -803.65 ml   Filed Weights   10/23/19 2203 10/24/19 0627 10/25/19 0348  Weight: 77.7 kg 77.6 kg 76.7 kg    Examination: General exam: Asleep, arousable, laying in bed, in nad Respiratory system: Normal respiratory effort, no wheezing Cardiovascular system: regular rate, s1, s2 Gastrointestinal system: Soft, nondistended, positive BS Central nervous system: CN2-12 grossly intact, strength intact Extremities: Perfused, no clubbing Skin: Normal skin turgor, no notable skin lesions seen Psychiatry: Mood normal // no visual hallucinations   Data  Reviewed: I have personally reviewed following labs and imaging studies  CBC: Recent Labs  Lab 10/23/19 1048 10/24/19 0420 10/25/19 0517  WBC 6.5 7.4 6.3  NEUTROABS 4.8  --   --   HGB 11.1* 10.0* 9.5*  HCT 36.5* 32.2* 30.6*  MCV 73.4* 71.9* 71.5*  PLT 140* 120* 161*   Basic Metabolic Panel: Recent Labs  Lab 10/23/19 1048 10/24/19 0420 10/25/19 0517  NA 135 138 140  K 5.1 4.1 3.1*  CL 99 99 101  CO2 25 25 27   GLUCOSE 113* 110* 159*  BUN 71* 67* 54*  CREATININE 3.07* 2.89* 2.69*  CALCIUM 9.6 9.2 8.9  MG  --  2.5* 2.5*   GFR: Estimated Creatinine Clearance: 24.6 mL/min (A) (by C-G formula based on SCr of 2.69 mg/dL (H)). Liver Function Tests: Recent Labs  Lab 10/23/19 1048 10/24/19 0420 10/25/19 0517  AST 45* 33 32  ALT 21 18 19   ALKPHOS 110 103 100  BILITOT 2.7* 2.4* 2.1*  PROT 7.7 6.5 6.3*  ALBUMIN 3.7 3.1* 2.9*   No results for input(s): LIPASE, AMYLASE in the last 168 hours. No results for input(s): AMMONIA in the last 168 hours. Coagulation Profile: Recent Labs  Lab 10/23/19 1115  INR 1.4*   Cardiac Enzymes: No results for input(s): CKTOTAL, CKMB, CKMBINDEX, TROPONINI in the last 168 hours. BNP (last 3 results) No results for input(s): PROBNP in the last 8760 hours. HbA1C: No results for input(s): HGBA1C in the last 72 hours. CBG: No results for input(s): GLUCAP in the last 168 hours. Lipid Profile: No results for input(s): CHOL, HDL, LDLCALC, TRIG, CHOLHDL, LDLDIRECT in the last 72 hours. Thyroid Function Tests: No results for input(s): TSH, T4TOTAL, FREET4, T3FREE, THYROIDAB in the last 72 hours. Anemia Panel: No results for input(s): VITAMINB12, FOLATE, FERRITIN, TIBC, IRON, RETICCTPCT in the last 72 hours. Sepsis Labs: No results for input(s): PROCALCITON, LATICACIDVEN in the last 168 hours.  Recent Results (from the past 240 hour(s))  SARS CORONAVIRUS 2 (TAT 6-24 HRS) Nasopharyngeal Nasopharyngeal Swab     Status: None   Collection Time:  10/23/19  2:06 PM   Specimen: Nasopharyngeal Swab  Result Value Ref Range Status   SARS Coronavirus 2 NEGATIVE NEGATIVE Final    Comment: (NOTE) SARS-CoV-2 target nucleic acids are NOT DETECTED. The SARS-CoV-2 RNA is generally detectable in upper and lower respiratory specimens during the acute phase of infection. Negative results do not preclude SARS-CoV-2 infection, do not rule out co-infections with other pathogens, and should not be used as the sole basis for treatment or other patient management decisions. Negative results must be combined with clinical observations, patient history, and epidemiological information. The expected result is Negative. Fact Sheet for Patients: SugarRoll.be Fact Sheet for Healthcare Providers: https://www.woods-mathews.com/ This test is not yet approved or cleared by the [place] FDA and  has been authorized for detection and/or diagnosis of SARS-CoV-2 by FDA under an Emergency Use Authorization (EUA). This EUA will remain  in effect (meaning this test can be used) for the duration of the COVID-19 declaration under Section 56 4(b)(1) of the Act, 21 U.S.C. section 360bbb-3(b)(1), unless the authorization is terminated or revoked sooner. Performed at [facility], [place] [address] [place], [place] [address]      Radiology Studies: US Renal  Result Date: 10/24/2019 CLINICAL DATA:  Acute renal failure superimposed on stage 3 chronic kidney disease. 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 EXAM: RENAL / URINARY TRACT ULTRASOUND COMPLETE COMPARISON:  Noncontrast abdominal CT 09/08/2019 FINDINGS: Right Kidney: Renal measurements: 9.6 x 4.2 x 4.7 cm = volume: 100 mL. Cyst in the central upper kidney on prior CT is not well demonstrated sonographically. Mild increased renal echogenicity. No hydronephrosis. No solid lesion visualized. Left Kidney: Renal measurements: 10 x 5.2 x  4.5 cm = volume: 122 mL. Simple cyst in the lower kidney measuring 2.3 cm. The ablation defect is not well visualized sonographically. No hydronephrosis. Bladder: Partially distended.  Wall thickening of 1 cm. Other: None. IMPRESSION: 1. No hydronephrosis or obstructive uropathy. 2. Left renal cyst. Ablation site left kidney on prior CT is not well demonstrated sonographically. 3. Bladder wall thickening may be due to nondistention. Electronically Signed   By: Keith Rake M.D.   On: 10/24/2019 05:17    Scheduled Meds: . amiodarone  200 mg Oral Daily  . apixaban  10 mg Oral BID   Followed by  . [START ON 10/31/2019] apixaban  5 mg Oral BID  . Chlorhexidine Gluconate Cloth  6 each Topical Daily  . furosemide  80 mg Intravenous BID  . metolazone  2.5 mg Oral Once  . mometasone-formoterol  2 puff Inhalation BID  . pantoprazole  40 mg Oral Daily  . potassium chloride  40 mEq Oral Once  . sodium chloride flush  10-40 mL Intracatheter Q12H  . sodium chloride flush  3 mL Intravenous Q12H  . sodium chloride flush  3 mL Intravenous Q12H  . tamsulosin  0.4 mg Oral QPC supper   Continuous Infusions: . sodium chloride    . milrinone 0.375 mcg/kg/min (10/24/19 1246)     LOS: 2 days   Marylu Lund, MD [facility] Pager On Amion  If 7PM-7AM, please contact night-coverage 10/25/2019, 1:44 PM

## 2019-10-25 NOTE — Progress Notes (Signed)
ANTICOAGULATION CONSULT NOTE  Pharmacy Consult for Heparin  Indication: DVT  No Known Allergies  Patient Measurements: Height: 6\' 1"  (185.4 cm) Weight: 171 lb (77.6 kg) IBW/kg (Calculated) : 79.9  Vital Signs: Temp: 97.5 F (36.4 C) (11/21 0348) Temp Source: Oral (11/21 0348) BP: 97/87 (11/21 0348) Pulse Rate: 79 (11/21 0348)  Labs: Recent Labs    10/23/19 1048 10/23/19 1115 10/23/19 1257 10/23/19 1917 10/24/19 0420 10/25/19 0517  HGB 11.1*  --   --   --  10.0* 9.5*  HCT 36.5*  --   --   --  32.2* 30.6*  PLT 140*  --   --   --  120* 120*  APTT  --  28  --   --   --   --   LABPROT  --  17.3*  --   --   --   --   INR  --  1.4*  --   --   --   --   HEPARINUNFRC  --   --   --  0.41 0.50 0.25*  CREATININE 3.07*  --   --   --  2.89* 2.69*  TROPONINIHS 96*  --  90*  --   --   --     Estimated Creatinine Clearance: 24.8 mL/min (A) (by C-G formula based on SCr of 2.69 mg/dL (H)).  Assessment:  78 y.o. male with DVT for heparin  Goal of Therapy:  Heparin level 0.3-0.5 units/ml Monitor platelets by anticoagulation protocol: Yes   Plan:  Increase Heparin 1200 units/hr  Phillis Knack, PharmD, BCPS

## 2019-10-25 NOTE — Progress Notes (Signed)
Patient ID: Spencer Rice, male   DOB: 1941-01-14, 78 y.o.   MRN: [id number]     Advanced Heart Failure Rounding Note  PCP-Cardiologist: No primary care provider on file.   Subjective:    Admitted with A/C systolic HF with recurrent low output heart failure despite milrinone 0.25 mcg.  Milrinone increased to 0.375, co-ox 52% this morning up from 43%.  CVP 14 today.  He had some diuresis yesterday, creatinine lower at 2.69.   Has RLE DVT, on IV heparin gtt.   Walked to bathroom without dyspnea.    Objective:   Weight Range: 76.7 kg Body mass index is 22.3 kg/m.   Vital Signs:   Temp:  [97.5 F (36.4 C)-98.5 F (36.9 C)] 97.5 F (36.4 C) (11/21 0901) Pulse Rate:  [79-86] 81 (11/21 0901) Resp:  [18-20] 18 (11/21 0901) BP: (92-106)/(57-87) 105/68 (11/21 0901) SpO2:  [99 %-100 %] 100 % (11/21 0901) Weight:  [76.7 kg] 76.7 kg (11/21 0348) Last BM Date: 10/23/19  Weight change: Filed Weights   10/23/19 2203 10/24/19 0627 10/25/19 0348  Weight: 77.7 kg 77.6 kg 76.7 kg    Intake/Output:   Intake/Output Summary (Last 24 hours) at 10/25/2019 0947 Last data filed at 10/25/2019 0907 Gross per 24 hour  Intake 1406.35 ml  Output 2210 ml  Net -803.65 ml      Physical Exam    General: NAD Neck: JVP 14, no thyromegaly or thyroid nodule.  Lungs: Clear to auscultation bilaterally with normal respiratory effort. CV: Lateral PMI.  Heart regular S1/S2, +S3, 3/6 HSM apex/LLSB.  1+ right ankle edema.   Abdomen: Soft, nontender, no hepatosplenomegaly, no distention.  Skin: Intact without lesions or rashes.  Neurologic: Alert and oriented x 3.  Psych: Normal affect. Extremities: No clubbing or cyanosis.  HEENT: Normal.    Telemetry   SR 80-90 Personally reviewed  EKG    N/A  Labs    CBC Recent Labs    10/23/19 1048 10/24/19 0420 10/25/19 0517  WBC 6.5 7.4 6.3  NEUTROABS 4.8  --   --   HGB 11.1* 10.0* 9.5*  HCT 36.5* 32.2* 30.6*  MCV 73.4* 71.9* 71.5*  PLT  140* 120* 784*   Basic Metabolic Panel Recent Labs    10/24/19 0420 10/25/19 0517  NA 138 140  K 4.1 3.1*  CL 99 101  CO2 25 27  GLUCOSE 110* 159*  BUN 67* 54*  CREATININE 2.89* 2.69*  CALCIUM 9.2 8.9  MG 2.5* 2.5*   Liver Function Tests Recent Labs    10/24/19 0420 10/25/19 0517  AST 33 32  ALT 18 19  ALKPHOS 103 100  BILITOT 2.4* 2.1*  PROT 6.5 6.3*  ALBUMIN 3.1* 2.9*   No results for input(s): LIPASE, AMYLASE in the last 72 hours. Cardiac Enzymes No results for input(s): CKTOTAL, CKMB, CKMBINDEX, TROPONINI in the last 72 hours.  BNP: BNP (last 3 results) Recent Labs    02/06/19 1850 08/21/19 2013 10/23/19 1048  BNP 2,172.9* 2,515.0* 3,200.7*    ProBNP (last 3 results) No results for input(s): PROBNP in the last 8760 hours.   D-Dimer No results for input(s): DDIMER in the last 72 hours. Hemoglobin A1C No results for input(s): HGBA1C in the last 72 hours. Fasting Lipid Panel No results for input(s): CHOL, HDL, LDLCALC, TRIG, CHOLHDL, LDLDIRECT in the last 72 hours. Thyroid Function Tests No results for input(s): TSH, T4TOTAL, T3FREE, THYROIDAB in the last 72 hours.  Invalid input(s): FREET3  Other results:  Imaging    No results found.   Medications:     Scheduled Medications: . amiodarone  200 mg Oral Daily  . Chlorhexidine Gluconate Cloth  6 each Topical Daily  . furosemide  80 mg Intravenous BID  . metolazone  2.5 mg Oral Once  . mometasone-formoterol  2 puff Inhalation BID  . pantoprazole  40 mg Oral Daily  . potassium chloride  40 mEq Oral Once  . sodium chloride flush  10-40 mL Intracatheter Q12H  . sodium chloride flush  3 mL Intravenous Q12H  . sodium chloride flush  3 mL Intravenous Q12H  . tamsulosin  0.4 mg Oral QPC supper    Infusions: . sodium chloride    . milrinone 0.375 mcg/kg/min (10/24/19 1246)    PRN Medications: sodium chloride, acetaminophen **OR** acetaminophen, HYDROcodone-acetaminophen, ondansetron  **OR** ondansetron (ZOFRAN) IV, polyethylene glycol, sodium chloride flush, sodium chloride flush    Assessment/Plan   1. 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 [place] 2015 with moderate disease. Has [employer] BiV ICD. - ECHO 10/22/19 EF severely EF 5-10% with severe MR/TR, biatrial enlargement, RV with moderately reduced function - Admitted with volume overload and recurrent low output HF despite home milrinone at 0.25 mcg.   - Milrinone increased to 0.375 mcg/kg/min.  Co-ox still low but up 43% => 52%.  - CVP 14, some diuresis yesterday with creatinine lower at 2.69.  Will give Lasix 80 mg IV bid today with a dose of metolazone 2.5 x 1 with next Lasix dose.  - SBP low. No room for hydralazine/imdur.  - No bb with low output - No spironolactone, dig, arb with AKI - Follow renal function closely.   2. AKI on CKD Stage IIIb: Creatinine baseline 1.8-2.2 - Creatinine better today, 2.89 => 2.69.   3. H/O RP bleed  - was on AC due to LV clot but stopped.   4. Acute RLE DVT - Anticoagulation, restarted, currently on heparin gtt but will switch to Eliquis today.    Waiting on palliative care for goals of care. Home when stabilized, tomorrow or Monday.  Will continue home milrinone, may be possible to get him on hospice with continued milrinone ([facility]).   Length of Stay: 2  [name], MD  10/25/2019, 9:47 AM  Advanced Heart Failure Team Pager [phone] (M-F; 7a - 4p)  Please contact [facility] for night-coverage after hours (4p -7a ) and weekends on [email or web address]

## 2019-10-26 DIAGNOSIS — Z515 Encounter for palliative care: Secondary | ICD-10-CM

## 2019-10-26 DIAGNOSIS — Z7189 Other specified counseling: Secondary | ICD-10-CM

## 2019-10-26 DIAGNOSIS — K117 Disturbances of salivary secretion: Secondary | ICD-10-CM

## 2019-10-26 DIAGNOSIS — N170 Acute kidney failure with tubular necrosis: Secondary | ICD-10-CM

## 2019-10-26 DIAGNOSIS — N1831 Chronic kidney disease, stage 3a: Secondary | ICD-10-CM

## 2019-10-26 LAB — CBC
HCT: 32.1 % — ABNORMAL LOW (ref 39.0–52.0)
Hemoglobin: 10.1 g/dL — ABNORMAL LOW (ref 13.0–17.0)
MCH: 22.5 pg — ABNORMAL LOW (ref 26.0–34.0)
MCHC: 31.5 g/dL (ref 30.0–36.0)
MCV: 71.5 fL — ABNORMAL LOW (ref 80.0–100.0)
Platelets: 128 10*3/uL — ABNORMAL LOW (ref 150–400)
RBC: 4.49 MIL/uL (ref 4.22–5.81)
RDW: 19.3 % — ABNORMAL HIGH (ref 11.5–15.5)
WBC: 6.1 10*3/uL (ref 4.0–10.5)
nRBC: 0.5 % — ABNORMAL HIGH (ref 0.0–0.2)

## 2019-10-26 LAB — COOXEMETRY PANEL
Carboxyhemoglobin: 1.7 % — ABNORMAL HIGH (ref 0.5–1.5)
Methemoglobin: 1.2 % (ref 0.0–1.5)
O2 Saturation: 46.3 %
Total hemoglobin: 10.4 g/dL — ABNORMAL LOW (ref 12.0–16.0)

## 2019-10-26 LAB — GLUCOSE, CAPILLARY: Glucose-Capillary: 121 mg/dL — ABNORMAL HIGH (ref 70–99)

## 2019-10-26 LAB — BASIC METABOLIC PANEL
Anion gap: 11 (ref 5–15)
BUN: 44 mg/dL — ABNORMAL HIGH (ref 8–23)
CO2: 26 mmol/L (ref 22–32)
Calcium: 8.9 mg/dL (ref 8.9–10.3)
Chloride: 100 mmol/L (ref 98–111)
Creatinine, Ser: 2.21 mg/dL — ABNORMAL HIGH (ref 0.61–1.24)
GFR calc Af Amer: 32 mL/min — ABNORMAL LOW (ref >60)
GFR calc non Af Amer: 28 mL/min — ABNORMAL LOW (ref >60)
Glucose, Bld: 127 mg/dL — ABNORMAL HIGH (ref 70–99)
Potassium: 3.7 mmol/L (ref 3.5–5.1)
Sodium: 137 mmol/L (ref 135–145)

## 2019-10-26 MED ORDER — BIOTENE DRY MOUTH MT LIQD: 15.0000 mL | OROMUCOSAL | Status: DC | PRN

## 2019-10-26 MED ORDER — FUROSEMIDE 10 MG/ML IJ SOLN
15.0000 mg/h | INTRAVENOUS | Status: DC
  Administered 2019-10-26 – 2019-10-27 (×2): 12 mg/h via INTRAVENOUS
  Administered 2019-10-28: 15 mg/h via INTRAVENOUS
  Filled 2019-10-26: qty 21
  Filled 2019-10-26 (×2): qty 25
  Filled 2019-10-26: qty 21

## 2019-10-26 MED ORDER — POTASSIUM CHLORIDE CRYS ER 20 MEQ PO TBCR
40.0000 meq | EXTENDED_RELEASE_TABLET | Freq: Once | ORAL | Status: AC
  Administered 2019-10-26: 09:00:00 40 meq via ORAL
  Filled 2019-10-26: qty 2

## 2019-10-26 NOTE — Consult Note (Signed)
Consultation Note Date: 10/26/2019   Patient Name: Spencer Rice  DOB: 1941-01-13  MRN: [id number]  Age / Sex: 78 y.o., male  PCP: [name]., MD Referring Physician: Donne Hazel, MD  Reason for Consultation: Establishing goals of care  HPI/Patient Profile: 78 y.o. 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  Workup reveals DVT R extremity, CHF exacerbation, acute on chronic renal failure.  Clinical Assessment and Goals of Care:  I have reviewed medical records including EPIC notes, labs and imaging, assessed the patient and then met at the bedside along with the patient to discuss diagnosis prognosis, GOC, EOL wishes, disposition and options.  I introduced Palliative Medicine as specialized medical care for people living with serious illness. It focuses on providing relief from the symptoms and stress of a serious illness. The goal is to improve quality of life for both the patient and the family.  We discussed a brief life review of the patient. His wife recently died in 06/28/23- he's been grieving her since. Lives at home. His son helps.   As far as functional and nutritional status- he notes increasing difficulty with ADL's. He gets SOB with ambulation. He spends most days bed to chair. He doesn't have much appetite. He c/o dry mouth.   We discussed his current illness and what it means in the larger context of his on-going co-morbidities.  Natural disease trajectory and expectations at EOL were discussed. He frankly tells me he is "living in my last days". He recounted his med history to me and states he knows, "my heart is close to giving out".   I attempted to elicit values and goals of care important to the patient. Bathing every day is very important  to him. Being at home, and not living in a SNF is most important.    The difference between aggressive medical intervention and comfort care was considered in light of the patient's goals of care.   Advanced directives, concepts specific to code status, artifical feeding and hydration, and rehospitalization were considered and discussed. Patient agrees that he would not want CPR, or artificial life support to sustain his life. He says if is heart stops and doesn't restart with the ICD, he does not want chest compressions or to be put on a ventilator. He agrees to DNR status.   Hospice and Palliative Care services outpatient were explained and offered. He declines hospice and palliative at this time- but requests that I visit him "everyday" while he is in the hospital to reassess his feelings.   Questions and concerns were addressed. He was encouraged to call with questions or concerns.   Primary Decision Maker PATIENT    SUMMARY OF RECOMMENDATIONS -DNR -Continue full scope care- pt is hopeful to stabilize and discharge home- would not want SNF -Xerostomia- biotene mouthwash ordered prn- encouraged not to use scope -Declines Hospice or Palliative at home-  but requests that PMT provider continue to visit while he is in the hospital    Code Status/Advance Care Planning:  DNR  Palliative Prophylaxis:   Oral Care  Prognosis:    < 6 months likely due to advanced HF with increasing needs for IV diuresis and inotrope dependent  Discharge Planning: To Be Determined  Primary Diagnoses: Present on Admission: . Deep vein thrombosis (DVT) of right lower extremity ([place]) . Acute renal failure superimposed on stage 3a chronic kidney disease ([place]) . Chronic combined systolic and diastolic CHF (congestive heart failure) ([place])   I have reviewed the medical record, interviewed the patient and family, and examined the patient. The following aspects are pertinent.  Past Medical History:   Diagnosis Date  . AICD (automatic cardioverter/defibrillator) present   . Cancer of left kidney ([place])    S/P OR 05/2014  . CHF (congestive heart failure) ([place])   . CKD (chronic kidney disease)   . 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 Coronary artery disease   . DVT (deep venous thrombosis) ([facility]) 1983   LLE  . GERD (gastroesophageal reflux disease)   . History of hiatal hernia   . Hypertension    Social History   Socioeconomic History  . Marital status: Married    Spouse name: Not on file  . Number of children: Not on file  . Years of education: Not on file  . Highest education level: Not on file  Occupational History  . Not on file  Social Needs  . Financial resource strain: Not on file  . Food insecurity    Worry: Not on file    Inability: Not on file  . Transportation needs    Medical: Not on file    Non-medical: Not on file  Tobacco Use  . Smoking status: Former Smoker    Packs/day: 0.75    Years: 17.00    Pack years: 12.75    Types: Cigarettes    Start date: 05/08/1959    Quit date: 06/20/1976    Years since quitting: 43.3  . Smokeless tobacco: Never Used  Substance and Sexual Activity  . Alcohol use: No  . Drug use: No  . Sexual activity: Not on file  Lifestyle  . Physical activity    Days per week: Not on file    Minutes per session: Not on file  . Stress: Not on file  Relationships  . Social Herbalist on phone: Not on file    Gets together: Not on file    Attends religious service: Not on file    Active member of club or organization: Not on file    Attends meetings of clubs or organizations: Not on file    Relationship status: Not on file  Other Topics Concern  . Not on file  Social History Narrative  . Not on file   Family History  Problem Relation Age of Onset  . Heart disease Father   . Heart attack Father 81  . Hypertension Father   . Hypertension Mother    Scheduled Meds: . amiodarone   200 mg Oral Daily  . apixaban  10 mg Oral BID   Followed by  . [START ON 10/31/2019] apixaban  5 mg Oral BID  . Chlorhexidine Gluconate Cloth  6 each Topical Daily  . mometasone-formoterol  2 puff Inhalation BID  . pantoprazole  40 mg Oral Daily  . sodium chloride flush  10-40 mL  Intracatheter Q12H  . sodium chloride flush  3 mL Intravenous Q12H  . sodium chloride flush  3 mL Intravenous Q12H  . tamsulosin  0.4 mg Oral QPC supper   Continuous Infusions: . sodium chloride    . furosemide (LASIX) infusion 12 mg/hr (10/26/19 1128)  . milrinone 0.5 mcg/kg/min (10/26/19 1129)   PRN Meds:.sodium chloride, acetaminophen **OR** acetaminophen, antiseptic oral rinse, HYDROcodone-acetaminophen, ondansetron **OR** ondansetron (ZOFRAN) IV, polyethylene glycol, sodium chloride flush, sodium chloride flush Medications Prior to Admission:  Prior to Admission medications   Medication Sig Start Date End Date Taking? Authorizing Provider  acetaminophen (TYLENOL) 325 MG tablet Take 2 tablets (650 mg total) by mouth every 4 (four) hours as needed for headache or mild pain. 01/29/19  Yes Shirley Friar, PA-C  amiodarone (PACERONE) 200 MG tablet TAKE 1 TABLET BY MOUTH EVERY DAY 10/02/19  Yes Bensimhon, Shaune Pascal, MD  ciprofloxacin (CIPRO) 500 MG tablet Take 500 mg by mouth daily. 09/24/19  Yes [provider]  docusate sodium (COLACE) 100 MG capsule Take 100 mg by mouth every other day.   Yes [provider]  metolazone (ZAROXOLYN) 2.5 MG tablet Take 1 tablet (2.5 mg total) by mouth once a week. Every Friday. 06/16/19 10/24/19 Yes Bensimhon, Shaune Pascal, MD  milrinone [facility]) 20 MG/100 ML SOLN infusion Inject 0.0205 mg/min into the vein continuous. 02/14/19  Yes Hennie Duos, MD  mometasone-formoterol [facility]) 200-5 MCG/ACT AERO Inhale 2 puffs into the lungs 2 (two) times daily. 02/14/19  Yes Hennie Duos, MD  OXYGEN Inhale 2 L into the lungs continuous.   Yes [provider]  pantoprazole (PROTONIX) 40 MG tablet Take 1 tablet (40 mg total) by mouth daily. 08/23/19  Yes Strader, [place] M, PA-C  potassium chloride SA (KLOR-CON M20) 20 MEQ tablet Take 2 tablets (40 mEq total) by mouth 2 (two) times daily. Patient taking differently: Take 40 mEq by mouth daily.  09/23/19  Yes Bensimhon, Shaune Pascal, MD  silodosin (RAPAFLO) 8 MG CAPS capsule Take 8 mg by mouth daily. 10/09/19  Yes [provider]  sucralfate (CARAFATE) 1 g tablet Take 1 tablet (1 g total) by mouth 4 (four) times daily -  with meals and at bedtime. Patient taking differently: Take 2 g by mouth 2 (two) times daily.  02/14/19  Yes Hennie Duos, MD  torsemide (DEMADEX) 20 MG tablet Take 2 tablets (40 mg total) by mouth 2 (two) times daily. 08/22/19  Yes Clegg, Amy D, NP  lactobacillus (FLORANEX/LACTINEX) PACK Take 1 packet (1 g total) by mouth 3 (three) times daily with meals. Patient not taking: Reported on 10/24/2019 02/14/19   Hennie Duos, MD  polyethylene glycol [facility]aveling) packet Take 17 g by mouth daily. Patient not taking: Reported on 10/24/2019 02/14/19   Hennie Duos, MD  spironolactone (ALDACTONE) 25 MG tablet TAKE 0.5 TABLETS (12.5 MG TOTAL) BY MOUTH AT BEDTIME. Patient not taking: Reported on 10/24/2019 02/18/19   Bensimhon, Shaune Pascal, MD  tamsulosin (FLOMAX) 0.4 MG CAPS capsule Take 2 capsules (0.8 mg total) by mouth daily after supper. Patient not taking: Reported on 10/24/2019 02/14/19   Hennie Duos, MD   No Known Allergies Review of Systems  Constitutional: Positive for appetite change and fatigue.    Physical Exam Vitals signs and nursing note reviewed.  Cardiovascular:     Rate and Rhythm: Normal rate.  Pulmonary:     Effort: Pulmonary effort is normal.  Neurological:  Mental Status: He is alert and oriented to person, place, and time.  Psychiatric:        Mood and Affect: Mood normal.     Vital Signs: BP 108/67 (BP Location:  Right Arm)   Pulse 85   Temp 98.4 F (36.9 C) (Oral)   Resp 18   Ht 6' 1" (1.854 m)   Wt 77.7 kg   SpO2 100%   BMI 22.60 kg/m  Pain Scale: 0-10   Pain Score: 0-No pain   SpO2: SpO2: 100 % O2 Device:SpO2: 100 % O2 Flow Rate: .O2 Flow Rate (L/min): 2 L/min  IO: Intake/output summary:   Intake/Output Summary (Last 24 hours) at 10/26/2019 1140 Last data filed at 10/26/2019 0900 Gross per 24 hour  Intake 240 ml  Output 750 ml  Net -510 ml    LBM: Last BM Date: 10/26/19 Baseline Weight: Weight: 80.2 kg Most recent weight: Weight: 77.7 kg     Palliative Assessment/Data: PPS: 50%     Thank you for this consult. Palliative medicine will continue to follow and assist as needed.   Time In: 1000 Time Out: 1155 Time Total: 1115 mins Prolonged services billed- Yes Greater than 50%  of this time was spent counseling and coordinating care related to the above assessment and plan.  Signed by: Mariana Kaufman, AGNP-C Palliative Medicine    Please contact Palliative Medicine Team phone at [phone] for questions and concerns.  For individual provider: See Shea Evans

## 2019-10-26 NOTE — Discharge Instructions (Addendum)
Information on my medicine - ELIQUIS (apixaban)  This medication education was reviewed with me or my healthcare representative as part of my discharge preparation.  The pharmacist that spoke with me during my hospital stay was:  Donnamae Jude, [facility]  Why was Eliquis prescribed for you? Eliquis was prescribed to treat blood clots that may have been found in the veins of your legs (deep vein thrombosis) or in your lungs (pulmonary embolism) and to reduce the risk of them occurring again.  What do You need to know about Eliquis ? The starting dose is 10 mg (two 5 mg tablets) taken TWICE daily for the FIRST SEVEN (7) DAYS, then on (enter date)  11/26  the dose is reduced to ONE 5 mg tablet taken TWICE daily.  Eliquis may be taken with or without food.   Try to take the dose about the same time in the morning and in the evening. If you have difficulty swallowing the tablet whole please discuss with your pharmacist how to take the medication safely.  Take Eliquis exactly as prescribed and DO NOT stop taking Eliquis without talking to the doctor who prescribed the medication.  Stopping may increase your risk of developing a new blood clot.  Refill your prescription before you run out.  After discharge, you should have regular check-up appointments with your healthcare provider that is prescribing your Eliquis.    What do you do if you miss a dose? If a dose of ELIQUIS is not taken at the scheduled time, take it as soon as possible on the same day and twice-daily administration should be resumed. The dose should not be doubled to make up for a missed dose.  Important Safety Information A possible side effect of Eliquis is bleeding. You should call your healthcare provider right away if you experience any of the following: ? Bleeding from an injury or your nose that does not stop. ? Unusual colored urine (red or dark brown) or unusual colored stools (red or black). ? Unusual bruising for  unknown reasons. ? A serious fall or if you hit your head (even if there is no bleeding).  Some medicines may interact with Eliquis and might increase your risk of bleeding or clotting while on Eliquis. To help avoid this, consult your healthcare provider or pharmacist prior to using any new prescription or non-prescription medications, including herbals, vitamins, non-steroidal anti-inflammatory drugs (NSAIDs) and supplements.  This website has more information on Eliquis (apixaban): http://www.eliquis.com/eliquis/home

## 2019-10-26 NOTE — Progress Notes (Signed)
Patient ID: Spencer Rice, male   DOB: 08-Mar-1941, 78 y.o.   MRN: [id number]     Advanced Heart Failure Rounding Note  PCP-Cardiologist: No primary care provider on file.   Subjective:    Admitted with A/C systolic HF with recurrent low output heart failure despite milrinone 0.25 mcg.  Milrinone increased to 0.375, but co-ox has still been low, 46% this morning.  CVP 22 today.  Poor diuresis yesterday, but creatinine lower at 2.69 => 2.21.   Has RLE DVT, now on Eliquis.  Walked to bathroom without much problem, has not walked in hall.  Comfortable at rest.    Objective:   Weight Range: 77.7 kg Body mass index is 22.6 kg/m.   Vital Signs:   Temp:  [97.5 F (36.4 C)-98.7 F (37.1 C)] 98.4 F (36.9 C) (11/22 0752) Pulse Rate:  [80-90] 85 (11/22 0752) Resp:  [18-20] 18 (11/22 0752) BP: (94-108)/(66-84) 108/67 (11/22 0752) SpO2:  [99 %-100 %] 100 % (11/22 0752) FiO2 (%):  [28 %] 28 % (11/21 1122) Weight:  [77.7 kg] 77.7 kg (11/22 0424) Last BM Date: 10/23/19  Weight change: Filed Weights   10/24/19 0627 10/25/19 0348 10/26/19 0424  Weight: 77.6 kg 76.7 kg 77.7 kg    Intake/Output:   Intake/Output Summary (Last 24 hours) at 10/26/2019 0857 Last data filed at 10/25/2019 2207 Gross per 24 hour  Intake -  Output 950 ml  Net -950 ml      Physical Exam    General: NAD Neck: JVP 16+ cm, no thyromegaly or thyroid nodule.  Lungs: Decreased at bases. CV: Lateral PMI.  Heart regular S1/S2, +S3, 3/6 HSM apex LLSB.  1+ edema ankles.  No carotid bruit.  Normal pedal pulses.  Abdomen: Soft, nontender, no hepatosplenomegaly, no distention.  Skin: Intact without lesions or rashes.  Neurologic: Alert and oriented x 3.  Psych: Normal affect. Extremities: No clubbing or cyanosis.  HEENT: Normal.    Telemetry   NSR with V-pacing. Personally reviewed  EKG    N/A  Labs    CBC Recent Labs    10/23/19 1048  10/25/19 0517 10/26/19 0532  WBC 6.5   < > 6.3 6.1   NEUTROABS 4.8  --   --   --   HGB 11.1*   < > 9.5* 10.1*  HCT 36.5*   < > 30.6* 32.1*  MCV 73.4*   < > 71.5* 71.5*  PLT 140*   < > 120* 128*   < > = values in this interval not displayed.   Basic Metabolic Panel Recent Labs    10/24/19 0420 10/25/19 0517 10/26/19 0532  NA 138 140 137  K 4.1 3.1* 3.7  CL 99 101 100  CO2 25 27 26   GLUCOSE 110* 159* 127*  BUN 67* 54* 44*  CREATININE 2.89* 2.69* 2.21*  CALCIUM 9.2 8.9 8.9  MG 2.5* 2.5*  --    Liver Function Tests Recent Labs    10/24/19 0420 10/25/19 0517  AST 33 32  ALT 18 19  ALKPHOS 103 100  BILITOT 2.4* 2.1*  PROT 6.5 6.3*  ALBUMIN 3.1* 2.9*   No results for input(s): LIPASE, AMYLASE in the last 72 hours. Cardiac Enzymes No results for input(s): CKTOTAL, CKMB, CKMBINDEX, TROPONINI in the last 72 hours.  BNP: BNP (last 3 results) Recent Labs    02/06/19 1850 08/21/19 2013 10/23/19 1048  BNP 2,172.9* 2,515.0* 3,200.7*    ProBNP (last 3 results) No results for input(s): PROBNP in the  last 8760 hours.   D-Dimer No results for input(s): DDIMER in the last 72 hours. Hemoglobin A1C No results for input(s): HGBA1C in the last 72 hours. Fasting Lipid Panel No results for input(s): CHOL, HDL, LDLCALC, TRIG, CHOLHDL, LDLDIRECT in the last 72 hours. Thyroid Function Tests No results for input(s): TSH, T4TOTAL, T3FREE, THYROIDAB in the last 72 hours.  Invalid input(s): FREET3  Other results:   Imaging    No results found.   Medications:     Scheduled Medications: . amiodarone  200 mg Oral Daily  . apixaban  10 mg Oral BID   Followed by  . [START ON 10/31/2019] apixaban  5 mg Oral BID  . Chlorhexidine Gluconate Cloth  6 each Topical Daily  . mometasone-formoterol  2 puff Inhalation BID  . pantoprazole  40 mg Oral Daily  . sodium chloride flush  10-40 mL Intracatheter Q12H  . sodium chloride flush  3 mL Intravenous Q12H  . sodium chloride flush  3 mL Intravenous Q12H  . tamsulosin  0.4 mg Oral  QPC supper    Infusions: . sodium chloride    . furosemide (LASIX) infusion    . milrinone 0.375 mcg/kg/min (10/25/19 1416)    PRN Medications: sodium chloride, acetaminophen **OR** acetaminophen, HYDROcodone-acetaminophen, ondansetron **OR** ondansetron (ZOFRAN) IV, polyethylene glycol, sodium chloride flush, sodium chloride flush    Assessment/Plan   1. 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 [place] 2015 with moderate disease. Has [employer] BiV ICD. - ECHO 10/22/19 EF severely EF 5-10% with severe MR/TR, biatrial enlargement, RV with moderately reduced function - Admitted with volume overload and recurrent low output HF despite home milrinone at 0.25 mcg.   - Milrinone increased to 0.375 mcg/kg/min.  Co-ox still low 46%.  I will increase milrinone to 0.5 today.  - CVP now >20, poor diuresis yesterday though creatinine lower at 2.21.  He got Lasix 80 mg IV this morning, will start infusion of Lasix at 12 mg/hr.  - SBP soft, probably no room for hydralazine/imdur.  - No bb with low output - No spironolactone, dig, arb with AKI - Follow renal function closely.   2. AKI on CKD Stage IIIb: Creatinine baseline 1.8-2.2 - Creatinine better today, 2.89 => 2.69 => 2.21.   3. H/O RP bleed  - was on AC due to LV clot but stopped.   4. Acute RLE DVT - Anticoagulation, restarted, currently on heparin gtt but will switch to Eliquis today.    Waiting on palliative care for goals of care, hopefully they will come by today. Home when stabilized.  Will continue home milrinone, may be possible to get him on hospice with continued milrinone ([facility]).   Length of Stay: 3  [name], MD  10/26/2019, 8:57 AM  Advanced Heart Failure Team Pager [phone] (M-F; 7a - 4p)  Please contact [facility] for night-coverage after hours (4p -7a ) and weekends on [email or web address]

## 2019-10-26 NOTE — Progress Notes (Signed)
PROGRESS NOTE    Spencer Rice  DDU:[id number] DOB: 14-Dec-1940 DOA: 10/23/2019 PCP: [name]., MD    Brief Narrative:  78 y.o. 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  Patient reports that he felt slightly more short of breath than usual for the past couple days, but then worsened significantly overnight last night.  His dyspnea persisted through the morning and he went to the ED for evaluation.  Reports that his weight has been stable, denies fevers, reports increased leg swelling on the right, but denies any leg tenderness or chest pain.  He denies any dietary indiscretion.  Diuretic was reduced recently due to worsening renal function.  Assessment & Plan:   Principal Problem:   Deep vein thrombosis (DVT) of right lower extremity (HCC) Active Problems:   Chronic combined systolic and diastolic CHF (congestive heart failure) (HCC)   Acute renal failure superimposed on stage 3a chronic kidney disease ([place])   Advanced care planning/counseling discussion   Goals of care, counseling/discussion   Xerostomia   1. Right leg DVT  - Presents with SOB and increased leg swelling Rt>Lt, and is found to have RLE DVT  - He has hx of LLE DVT and mural thrombus, and had been anticoagulated until retroperitoneal bleed early this year -Initially started on IV heparin in ED, now on eliquis  2. Acute kidney injury superimposed on CKD III  - SCr is 3.07 on admission, up from 2.0 in September 2020  - Renal US reviewed, no evidence of obstructive disease - Stable. Will repeat bmet in AM  3. Chronic combined systolic & diastolic CHF; NSVT  -  Presents with increased SOB, denies wt gain, no edema noted on CXR, ?SOB secondary to PE in light of his DVT and clear CXR but he reports improvement after Lasix 80 mg IV in ED  - EF was 5-10% on 10/22/19 and he is dependent on milrinone infusion  - He has NSVT, on amiodarone, ICD in place  - Cardiology following -Palliative Care is recommended by Cardiology. Continue full scope of treatment per pt wishes following Palliative Care meeting - Cr somewhat improved, however less urine outpt. Milrinone increased per Cardiology  4. COPD  - No cough or wheezing on admission  - Continue Dulera   -Currently on minimal O2 support at this time  DVT prophylaxis: eliquis Code Status: Full Family Communication: Pt in room, family not at bedside Disposition Plan: Uncertain at this time  Consultants:   Cardiology  Palliative Care  Procedures:     Antimicrobials: Anti-infectives (From admission, onward)   None      Subjective: Without complaints at this time  Objective: Vitals:   10/26/19 0424 10/26/19 0749 10/26/19 0752 10/26/19 1141  BP: 101/71  108/67 92/64  Pulse: 83  85 84  Resp: 20  18 20   Temp: 98.3 F (36.8 C)  98.4 F (36.9 C) 98.3 F (36.8 C)  TempSrc: Oral  Oral Oral  SpO2: 100% 100% 100% 100%  Weight: 77.7 kg     Height:        Intake/Output Summary (Last 24 hours) at 10/26/2019 1416 Last data filed at 10/26/2019 0900 Gross per 24 hour  Intake  240 ml  Output 750 ml  Net -510 ml   Filed Weights   10/24/19 7017 10/25/19 0348 10/26/19 0424  Weight: 77.6 kg 76.7 kg 77.7 kg    Examination: General exam: Conversant, in no acute distress Respiratory system: normal chest rise, clear, no audible wheezing Cardiovascular system: regular rhythm, s1-s2 Gastrointestinal system: Nondistended, nontender, pos BS Central nervous system: No seizures, no tremors Extremities: No cyanosis, no joint deformities Skin: No rashes, no pallor Psychiatry: Affect normal // no  auditory hallucinations   Data Reviewed: I have personally reviewed following labs and imaging studies  CBC: Recent Labs  Lab 10/23/19 1048 10/24/19 0420 10/25/19 0517 10/26/19 0532  WBC 6.5 7.4 6.3 6.1  NEUTROABS 4.8  --   --   --   HGB 11.1* 10.0* 9.5* 10.1*  HCT 36.5* 32.2* 30.6* 32.1*  MCV 73.4* 71.9* 71.5* 71.5*  PLT 140* 120* 120* 793*   Basic Metabolic Panel: Recent Labs  Lab 10/23/19 1048 10/24/19 0420 10/25/19 0517 10/26/19 0532  NA 135 138 140 137  K 5.1 4.1 3.1* 3.7  CL 99 99 101 100  CO2 25 25 27 26   GLUCOSE 113* 110* 159* 127*  BUN 71* 67* 54* 44*  CREATININE 3.07* 2.89* 2.69* 2.21*  CALCIUM 9.6 9.2 8.9 8.9  MG  --  2.5* 2.5*  --    GFR: Estimated Creatinine Clearance: 30.3 mL/min (A) (by C-G formula based on SCr of 2.21 mg/dL (H)). Liver Function Tests: Recent Labs  Lab 10/23/19 1048 10/24/19 0420 10/25/19 0517  AST 45* 33 32  ALT 21 18 19   ALKPHOS 110 103 100  BILITOT 2.7* 2.4* 2.1*  PROT 7.7 6.5 6.3*  ALBUMIN 3.7 3.1* 2.9*   No results for input(s): LIPASE, AMYLASE in the last 168 hours. No results for input(s): AMMONIA in the last 168 hours. Coagulation Profile: Recent Labs  Lab 10/23/19 1115  INR 1.4*   Cardiac Enzymes: No results for input(s): CKTOTAL, CKMB, CKMBINDEX, TROPONINI in the last 168 hours. BNP (last 3 results) No results for input(s): PROBNP in the last 8760 hours. HbA1C: No results for input(s): HGBA1C in the last 72 hours. CBG: Recent Labs  Lab 10/26/19 0634  GLUCAP 121*   Lipid Profile: No results for input(s): CHOL, HDL, LDLCALC, TRIG, CHOLHDL, LDLDIRECT in the last 72 hours. Thyroid Function Tests: No results for input(s): TSH, T4TOTAL, FREET4, T3FREE, THYROIDAB in the last 72 hours. Anemia Panel: No results for input(s): VITAMINB12, FOLATE, FERRITIN, TIBC, IRON, RETICCTPCT in the last 72 hours. Sepsis Labs: No results for input(s): PROCALCITON, LATICACIDVEN in the last 168 hours.  Recent Results (from  the past 240 hour(s))  SARS CORONAVIRUS 2 (TAT 6-24 HRS) Nasopharyngeal Nasopharyngeal Swab     Status: None   Collection Time: 10/23/19  2:06 PM   Specimen: Nasopharyngeal Swab  Result Value Ref Range Status   SARS Coronavirus 2 NEGATIVE NEGATIVE Final    Comment: (NOTE) SARS-CoV-2 target nucleic acids are NOT DETECTED. The SARS-CoV-2 RNA is generally detectable in upper and lower respiratory specimens during the acute phase of infection. Negative results do not preclude SARS-CoV-2 infection, do not rule out co-infections with other pathogens, and should not be used as the sole basis for treatment or other patient management decisions. Negative results must be combined with clinical observations, patient history, and epidemiological information. The expected result is Negative. Fact Sheet for Patients: SugarRoll.be Fact Sheet for Healthcare Providers: https://www.woods-mathews.com/ This test is not yet approved or cleared by the [place] FDA  and  has been authorized for detection and/or diagnosis of SARS-CoV-2 by FDA under an Emergency Use Authorization (EUA). This EUA will remain  in effect (meaning this test can be used) for the duration of the COVID-19 declaration under Section 56 4(b)(1) of the Act, 21 U.S.C. section 360bbb-3(b)(1), unless the authorization is terminated or revoked sooner. Performed at [facility], [place] [address], [place], [place] [address]      Radiology Studies: No results found.  Scheduled Meds: . amiodarone  200 mg Oral Daily  . apixaban  10 mg Oral BID   Followed by  . [START ON 10/30/2019] apixaban  5 mg Oral BID  . Chlorhexidine Gluconate Cloth  6 each Topical Daily  . mometasone-formoterol  2 puff Inhalation BID  . pantoprazole  40 mg Oral Daily  . sodium chloride flush  10-40 mL Intracatheter Q12H  . sodium chloride flush  3 mL Intravenous Q12H  . sodium chloride flush  3 mL Intravenous  Q12H  . tamsulosin  0.4 mg Oral QPC supper   Continuous Infusions: . sodium chloride    . furosemide (LASIX) infusion 12 mg/hr (10/26/19 1128)  . milrinone 0.5 mcg/kg/min (10/26/19 1129)     LOS: 3 days   Marylu Lund, MD [facility] Pager On Amion  If 7PM-7AM, please contact night-coverage 10/26/2019, 2:16 PM

## 2019-10-27 DIAGNOSIS — I824Y1 Acute embolism and thrombosis of unspecified deep veins of right proximal lower extremity: Secondary | ICD-10-CM

## 2019-10-27 DIAGNOSIS — R570 Cardiogenic shock: Secondary | ICD-10-CM

## 2019-10-27 DIAGNOSIS — I509 Heart failure, unspecified: Secondary | ICD-10-CM

## 2019-10-27 LAB — CBC
HCT: 29.2 % — ABNORMAL LOW (ref 39.0–52.0)
Hemoglobin: 9.1 g/dL — ABNORMAL LOW (ref 13.0–17.0)
MCH: 22.2 pg — ABNORMAL LOW (ref 26.0–34.0)
MCHC: 31.2 g/dL (ref 30.0–36.0)
MCV: 71.4 fL — ABNORMAL LOW (ref 80.0–100.0)
Platelets: 121 10*3/uL — ABNORMAL LOW (ref 150–400)
RBC: 4.09 MIL/uL — ABNORMAL LOW (ref 4.22–5.81)
RDW: 18.7 % — ABNORMAL HIGH (ref 11.5–15.5)
WBC: 5.3 10*3/uL (ref 4.0–10.5)
nRBC: 0.4 % — ABNORMAL HIGH (ref 0.0–0.2)

## 2019-10-27 LAB — COOXEMETRY PANEL
Carboxyhemoglobin: 1.4 % (ref 0.5–1.5)
Methemoglobin: 0.6 % (ref 0.0–1.5)
O2 Saturation: 53.4 %
Total hemoglobin: 9.3 g/dL — ABNORMAL LOW (ref 12.0–16.0)

## 2019-10-27 LAB — BASIC METABOLIC PANEL
Anion gap: 8 (ref 5–15)
Anion gap: 11 (ref 5–15)
BUN: 43 mg/dL — ABNORMAL HIGH (ref 8–23)
BUN: 42 mg/dL — ABNORMAL HIGH (ref 8–23)
CO2: 29 mmol/L (ref 22–32)
CO2: 28 mmol/L (ref 22–32)
Calcium: 8.5 mg/dL — ABNORMAL LOW (ref 8.9–10.3)
Calcium: 8.7 mg/dL — ABNORMAL LOW (ref 8.9–10.3)
Chloride: 95 mmol/L — ABNORMAL LOW (ref 98–111)
Chloride: 91 mmol/L — ABNORMAL LOW (ref 98–111)
Creatinine, Ser: 2.08 mg/dL — ABNORMAL HIGH (ref 0.61–1.24)
Creatinine, Ser: 2.19 mg/dL — ABNORMAL HIGH (ref 0.61–1.24)
GFR calc Af Amer: 34 mL/min — ABNORMAL LOW (ref >60)
GFR calc Af Amer: 32 mL/min — ABNORMAL LOW (ref >60)
GFR calc non Af Amer: 30 mL/min — ABNORMAL LOW (ref >60)
GFR calc non Af Amer: 28 mL/min — ABNORMAL LOW (ref >60)
Glucose, Bld: 112 mg/dL — ABNORMAL HIGH (ref 70–99)
Glucose, Bld: 179 mg/dL — ABNORMAL HIGH (ref 70–99)
Potassium: 2.9 mmol/L — ABNORMAL LOW (ref 3.5–5.1)
Potassium: 3.6 mmol/L (ref 3.5–5.1)
Sodium: 132 mmol/L — ABNORMAL LOW (ref 135–145)
Sodium: 130 mmol/L — ABNORMAL LOW (ref 135–145)

## 2019-10-27 LAB — MAGNESIUM: Magnesium: 1.6 mg/dL — ABNORMAL LOW (ref 1.7–2.4)

## 2019-10-27 MED ORDER — POTASSIUM CHLORIDE 10 MEQ/100ML IV SOLN
10.0000 meq | INTRAVENOUS | Status: DC
  Administered 2019-10-27: 10 meq via INTRAVENOUS
  Filled 2019-10-27: qty 100

## 2019-10-27 MED ORDER — POTASSIUM CHLORIDE CRYS ER 20 MEQ PO TBCR
40.0000 meq | EXTENDED_RELEASE_TABLET | ORAL | Status: AC
  Administered 2019-10-27 (×2): 40 meq via ORAL
  Filled 2019-10-27 (×2): qty 2

## 2019-10-27 MED ORDER — POTASSIUM CHLORIDE CRYS ER 20 MEQ PO TBCR
40.0000 meq | EXTENDED_RELEASE_TABLET | Freq: Once | ORAL | Status: AC
  Administered 2019-10-27: 40 meq via ORAL
  Filled 2019-10-27: qty 4

## 2019-10-27 MED ORDER — MAGNESIUM SULFATE 4 GM/100ML IV SOLN
4.0000 g | Freq: Once | INTRAVENOUS | Status: AC
  Administered 2019-10-27: 12:00:00 4 g via INTRAVENOUS
  Filled 2019-10-27: qty 100

## 2019-10-27 NOTE — Progress Notes (Addendum)
Patient ID: Spencer Rice, male   DOB: 1941/11/20, 78 y.o.   MRN: [id number]     Advanced Heart Failure Rounding Note  PCP-Cardiologist: No primary care provider on file.   Subjective:    Yesterday milrinone increased to 0.5 mcg and switched to lasix drip at 12 mg per hour. Improved urine output. Weight unchanged. Co-ox 53%  Has RLE DVT, now on Eliquis.  Denies SOB. Hungry this morning.    Objective:   Weight Range: 77.9 kg Body mass index is 22.67 kg/m.   Vital Signs:   Temp:  [97.8 F (36.6 C)-98.9 F (37.2 C)] 98.9 F (37.2 C) (11/23 0534) Pulse Rate:  [79-84] 79 (11/23 0534) Resp:  [16-20] 18 (11/23 0534) BP: (91-96)/(64-72) 96/72 (11/23 0534) SpO2:  [99 %-100 %] 99 % (11/23 0534) Weight:  [77.9 kg] 77.9 kg (11/23 0534) Last BM Date: 10/26/19  Weight change: Filed Weights   10/25/19 0348 10/26/19 0424 10/27/19 0534  Weight: 76.7 kg 77.7 kg 77.9 kg    Intake/Output:   Intake/Output Summary (Last 24 hours) at 10/27/2019 0813 Last data filed at 10/27/2019 0800 Gross per 24 hour  Intake 1091.49 ml  Output 2400 ml  Net -1308.51 ml      Physical Exam    CVP 16 personally reviewed.  General:. No resp difficulty HEENT: normal Neck: supple. JVP to jaw . Carotids 2+ bilat; no bruits. No lymphadenopathy or thryomegaly appreciated. Cor: PMI nondisplaced. Regular rate & rhythm. No rubs. 3/6 HSM LLSB. + S3  Lungs: clear on 2 liters  Abdomen: soft, nontender, nondistended. No hepatosplenomegaly. No bruits or masses. Good bowel sounds. Extremities: no cyanosis, clubbing, rash,  R and LLE 1+ edema Neuro: alert & orientedx3, cranial nerves grossly intact. moves all 4 extremities w/o difficulty. Affect pleasant   Telemetry  SR Vpaced 80s personally reviewed.   EKG    N/A  Labs    CBC Recent Labs    10/26/19 0532 10/27/19 0500  WBC 6.1 5.3  HGB 10.1* 9.1*  HCT 32.1* 29.2*  MCV 71.5* 71.4*  PLT 128* 789*   Basic Metabolic Panel Recent Labs   10/25/19 0517 10/26/19 0532 10/27/19 0500  NA 140 137 132*  K 3.1* 3.7 2.9*  CL 101 100 95*  CO2 '27 26 29  ' GLUCOSE 159* 127* 112*  BUN 54* 44* 43*  CREATININE 2.69* 2.21* 2.08*  CALCIUM 8.9 8.9 8.5*  MG 2.5*  --   --    Liver Function Tests Recent Labs    10/25/19 0517  AST 32  ALT 19  ALKPHOS 100  BILITOT 2.1*  PROT 6.3*  ALBUMIN 2.9*   No results for input(s): LIPASE, AMYLASE in the last 72 hours. Cardiac Enzymes No results for input(s): CKTOTAL, CKMB, CKMBINDEX, TROPONINI in the last 72 hours.  BNP: BNP (last 3 results) Recent Labs    02/06/19 1850 08/21/19 2013 10/23/19 1048  BNP 2,172.9* 2,515.0* 3,200.7*    ProBNP (last 3 results) No results for input(s): PROBNP in the last 8760 hours.   D-Dimer No results for input(s): DDIMER in the last 72 hours. Hemoglobin A1C No results for input(s): HGBA1C in the last 72 hours. Fasting Lipid Panel No results for input(s): CHOL, HDL, LDLCALC, TRIG, CHOLHDL, LDLDIRECT in the last 72 hours. Thyroid Function Tests No results for input(s): TSH, T4TOTAL, T3FREE, THYROIDAB in the last 72 hours.  Invalid input(s): FREET3  Other results:   Imaging    No results found.   Medications:     Scheduled  Medications: . amiodarone  200 mg Oral Daily  . apixaban  10 mg Oral BID   Followed by  . [START ON 10/30/2019] apixaban  5 mg Oral BID  . Chlorhexidine Gluconate Cloth  6 each Topical Daily  . mometasone-formoterol  2 puff Inhalation BID  . pantoprazole  40 mg Oral Daily  . sodium chloride flush  10-40 mL Intracatheter Q12H  . sodium chloride flush  3 mL Intravenous Q12H  . sodium chloride flush  3 mL Intravenous Q12H  . tamsulosin  0.4 mg Oral QPC supper    Infusions: . sodium chloride    . furosemide (LASIX) infusion 12 mg/hr (10/26/19 1128)  . milrinone 0.5 mcg/kg/min (10/26/19 1129)  . potassium chloride 10 mEq (10/27/19 0753)    PRN Medications: sodium chloride, acetaminophen **OR**  acetaminophen, antiseptic oral rinse, HYDROcodone-acetaminophen, ondansetron **OR** ondansetron (ZOFRAN) IV, polyethylene glycol, sodium chloride flush, sodium chloride flush    Assessment/Plan   1. 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 [place] 2015 with moderate disease. Has Biotronik BiV ICD. - ECHO 10/22/19 EF severely EF 5-10% with severe MR/TR, biatrial enlargement, RV with moderately reduced function - Admitted with volume overload and recurrent low output HF despite home milrinone at 0.25 mcg.   - Milrinone increased to 0.5 mcg/kg/min.  Co-ox still remains marginal 53%.   CVP 16. Continue lasix drip 12 mg per hour. Add ted hose.  - - SBP soft, probably no room for hydralazine/imdur.  - No bb with low output - No spironolactone, dig, arb with AKI - Follow renal function closely.   2. AKI on CKD Stage IIIb: Creatinine baseline 1.8-2.2 - Creatinine better today, 2.89 => 2.69 => 2.21=>2.1  Follow BMET daily.   3. H/O RP bleed  - was on [facility] due to LV clot but stopped.   4. Acute RLE DVT -Initially on heparin but switched to Eliquis  10 mg bid until 11/25 followed by eliquis 5 mg twice a day.  Hgb trending down 10.1>9.1. CBC in am.   5. Hypokalemia  K 2.9 Supp K and check BMET at 1300 today.   Palliative Care appreciated.  Ambulate today.   Length of Stay: 4  Amy Clegg, NP  10/27/2019, 8:13 AM  Advanced Heart Failure Team Pager [phone] (M-F; 7a - 4p)  Please contact [facility] for night-coverage after hours (4p -7a ) and weekends on [email or web address]  Patient seen and examined with the above-signed Advanced Practice Provider and/or Housestaff. I personally reviewed laboratory data, imaging studies and relevant notes. I independently examined the patient and formulated the important aspects of the plan. I have edited the note to reflect any of my changes or salient points. I have personally discussed the plan  with the patient and/or family.  Milrinone now at 0.5. Feeling a bit better but remains very tenuous Co-ox only 53% c/w ongoing cardiogenic shock. Volume status remains elevated. Renal function coming back to baseline. Agree with continuing IV lasix. Needs aggressive K & Mg supp.  On Eliquis for acute DVT. Hgd dropping but no overt bleeding.   Has met with hospice team and willing to accept Hospice if he can continue home milrinone. I doubt milrinone will be effective for him much longer however.   Glori Bickers, MD  12:32 PM

## 2019-10-27 NOTE — Progress Notes (Deleted)
Patient ID: Spencer Rice, male   DOB: 07/10/1941, 78 y.o.   MRN: [id number]     Advanced Heart Failure Rounding Note  PCP-Cardiologist: No primary care provider on file.   Subjective:    Admitted with A/C systolic HF with recurrent low output heart failure despite milrinone 0.25 mcg.  Milrinone increased to 0.375, but co-ox remained low at 46% yesterday.  Milrinone further increased to 0.5. Co-ox improved 54% today. Diuresis also improved w/ 2L out yesterday. SCr improved, 2.69>>2.21>>2.08. K 2.9.   Has RLE DVT, now on Eliquis.  Feels better. Breathing improved.    Objective:   Weight Range: 77.9 kg Body mass index is 22.67 kg/m.   Vital Signs:   Temp:  [97.8 F (36.6 C)-98.9 F (37.2 C)] 98.9 F (37.2 C) (11/23 0534) Pulse Rate:  [79-84] 79 (11/23 0534) Resp:  [16-20] 18 (11/23 0534) BP: (91-96)/(64-72) 96/72 (11/23 0534) SpO2:  [99 %-100 %] 99 % (11/23 0534) Weight:  [77.9 kg] 77.9 kg (11/23 0534) Last BM Date: 10/26/19  Weight change: Filed Weights   10/25/19 0348 10/26/19 0424 10/27/19 0534  Weight: 76.7 kg 77.7 kg 77.9 kg    Intake/Output:   Intake/Output Summary (Last 24 hours) at 10/27/2019 0820 Last data filed at 10/27/2019 0800 Gross per 24 hour  Intake 1091.49 ml  Output 2400 ml  Net -1308.51 ml      Physical Exam    PHYSICAL EXAM: General:  Elderly AAM. No respiratory difficulty, on 2L [place] HEENT: normal Neck: supple. elevated JVD to ear. Carotids 2+ bilat; no bruits. No lymphadenopathy or thyromegaly appreciated. Cor: PMI nondisplaced. Regular rate & rhythm. No rubs, gallops or murmurs. Lungs: clear Abdomen: soft, nontender, nondistended. No hepatosplenomegaly. No bruits or masses. Good bowel sounds. Extremities: no cyanosis, clubbing, rash, 2+ bilateral LE pitting edema Neuro: alert & oriented x 3, cranial nerves grossly intact. moves all 4 extremities w/o difficulty. Affect pleasant.    Telemetry   NSR with V-pacing, 70s w/ 18 beat run  of NSVR. Personally reviewed  EKG    N/A  Labs    CBC Recent Labs    10/26/19 0532 10/27/19 0500  WBC 6.1 5.3  HGB 10.1* 9.1*  HCT 32.1* 29.2*  MCV 71.5* 71.4*  PLT 128* 191*   Basic Metabolic Panel Recent Labs    10/25/19 0517 10/26/19 0532 10/27/19 0500  NA 140 137 132*  K 3.1* 3.7 2.9*  CL 101 100 95*  CO2 27 26 29   GLUCOSE 159* 127* 112*  BUN 54* 44* 43*  CREATININE 2.69* 2.21* 2.08*  CALCIUM 8.9 8.9 8.5*  MG 2.5*  --   --    Liver Function Tests Recent Labs    10/25/19 0517  AST 32  ALT 19  ALKPHOS 100  BILITOT 2.1*  PROT 6.3*  ALBUMIN 2.9*   No results for input(s): LIPASE, AMYLASE in the last 72 hours. Cardiac Enzymes No results for input(s): CKTOTAL, CKMB, CKMBINDEX, TROPONINI in the last 72 hours.  BNP: BNP (last 3 results) Recent Labs    02/06/19 1850 08/21/19 2013 10/23/19 1048  BNP 2,172.9* 2,515.0* 3,200.7*    ProBNP (last 3 results) No results for input(s): PROBNP in the last 8760 hours.   D-Dimer No results for input(s): DDIMER in the last 72 hours. Hemoglobin A1C No results for input(s): HGBA1C in the last 72 hours. Fasting Lipid Panel No results for input(s): CHOL, HDL, LDLCALC, TRIG, CHOLHDL, LDLDIRECT in the last 72 hours. Thyroid Function Tests No results for input(s):  TSH, T4TOTAL, T3FREE, THYROIDAB in the last 72 hours.  Invalid input(s): FREET3  Other results:   Imaging    No results found.   Medications:     Scheduled Medications: . amiodarone  200 mg Oral Daily  . apixaban  10 mg Oral BID   Followed by  . [START ON 10/30/2019] apixaban  5 mg Oral BID  . Chlorhexidine Gluconate Cloth  6 each Topical Daily  . mometasone-formoterol  2 puff Inhalation BID  . pantoprazole  40 mg Oral Daily  . sodium chloride flush  10-40 mL Intracatheter Q12H  . sodium chloride flush  3 mL Intravenous Q12H  . sodium chloride flush  3 mL Intravenous Q12H  . tamsulosin  0.4 mg Oral QPC supper    Infusions: . sodium  chloride    . furosemide (LASIX) infusion 12 mg/hr (10/26/19 1128)  . milrinone 0.5 mcg/kg/min (10/26/19 1129)  . potassium chloride 10 mEq (10/27/19 0753)    PRN Medications: sodium chloride, acetaminophen **OR** acetaminophen, antiseptic oral rinse, HYDROcodone-acetaminophen, ondansetron **OR** ondansetron (ZOFRAN) IV, polyethylene glycol, sodium chloride flush, sodium chloride flush    Assessment/Plan   1. 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 [place] 2015 with moderate disease. Has Biotronik BiV ICD. - ECHO 10/22/19 EF severely EF 5-10% with severe MR/TR, biatrial enlargement, RV with moderately reduced function - Admitted with volume overload and recurrent low output HF despite home milrinone at 0.25 mcg.   - Milrinone increased to 0.5 mcg/kg/min this admit.  Co-ox improved from 46>>54%.   - Remains volume overloaded but diuresis improving w/ up titration of milrinone, -2L out yesterday. SCr improving.  - Continue Lasix gtt at 12 mg/hr.  - SBP soft, no room for hydralazine/imdur.  - No bb with low output - No spironolactone, dig, arb with AKI - Follow renal function closely.   2. AKI on CKD Stage IIIb: Creatinine baseline 1.8-2.2 - Creatinine better today, 2.89 => 2.69 => 2.21=>2.08.   3. H/O RP bleed  - was on AC due to LV clot but stopped.   4. Acute RLE DVT - Anticoagulation has been restarted, was on heparin gtt but switched to Eliquis yesterday. Hgb 9.1. Monitor closely.   5. Hypokalemia: K 2.9, in the setting of IV Lasix.  - supp IV K ordered - Will check Mg lab - repeat Labs @1300  and given additional supp if needed.   6. NSVT: in the setting of low EF, milrinone and hypokalemia - supp electrolytes. Keep K > 4 and Mg > 2. - Monitor on tele   7. Goals of Care: palliative care consult yesterday. Pt is DNR but declines hospice and palliative care at this time.   Length of Stay: [address] PA-C  10/27/2019, 8:20 AM  Advanced Heart Failure Team Pager [phone] (M-F; 7a - 4p)  Please contact [facility] for night-coverage after hours (4p -7a ) and weekends on [email or web address]

## 2019-10-27 NOTE — Plan of Care (Signed)
  Problem: Education: Goal: Knowledge of General Education information will improve Description: Including pain rating scale, medication(s)/side effects and non-pharmacologic comfort measures Outcome: Progressing   Problem: Health Behavior/Discharge Planning: Goal: Ability to manage health-related needs will improve Outcome: Progressing   Problem: Clinical Measurements: Goal: Ability to maintain clinical measurements within normal limits will improve Outcome: Progressing Goal: Will remain free from infection Outcome: Progressing Goal: Diagnostic test results will improve Outcome: Progressing Goal: Respiratory complications will improve Outcome: Progressing Goal: Cardiovascular complication will be avoided Outcome: Progressing   Problem: Activity: Goal: Risk for activity intolerance will decrease Outcome: Progressing   Problem: Nutrition: Goal: Adequate nutrition will be maintained Outcome: Progressing   Problem: Coping: Goal: Level of anxiety will decrease Outcome: Progressing   Problem: Elimination: Goal: Will not experience complications related to bowel motility Outcome: Progressing Goal: Will not experience complications related to urinary retention Outcome: Progressing   Problem: Pain Managment: Goal: General experience of comfort will improve Outcome: Progressing   Problem: Safety: Goal: Ability to remain free from injury will improve Outcome: Progressing   Problem: Skin Integrity: Goal: Risk for impaired skin integrity will decrease Outcome: Progressing   Problem: Education: Goal: Ability to demonstrate management of disease process will improve Outcome: Progressing Goal: Ability to verbalize understanding of medication therapies will improve Outcome: Progressing Goal: Individualized Educational Video(s) Outcome: Progressing   Problem: Activity: Goal: Capacity to carry out activities will improve Outcome: Progressing   Problem: Cardiac: Goal:  Ability to achieve and maintain adequate cardiopulmonary perfusion will improve Outcome: Progressing Note: CVP monitoring with improvement

## 2019-10-27 NOTE — Progress Notes (Signed)
Daily Progress Note   Patient Name: Spencer Rice       Date: 10/27/2019 DOB: Jan 09, 1941  Age: 78 y.o. MRN#: [id number] Attending Physician: Donne Hazel, MD Primary Care Physician: [name]., MD Admit Date: 10/23/2019  Reason for Consultation/Follow-up: Establishing goals of care  Subjective: Patient sitting up this morning in chair. Says he feels better. Legs swelling-RN is placing TED hose.  We discussed his discharge plans. He feels he does need some help at home. He is agreeable to Hospice.   Review of Systems  Constitutional: Positive for malaise/fatigue and weight loss.  Respiratory: Negative for cough and shortness of breath.   Cardiovascular: Negative for chest pain.    Length of Stay: 4  Current Medications: Scheduled Meds:  . amiodarone  200 mg Oral Daily  . apixaban  10 mg Oral BID   Followed by  . [START ON 10/30/2019] apixaban  5 mg Oral BID  . Chlorhexidine Gluconate Cloth  6 each Topical Daily  . mometasone-formoterol  2 puff Inhalation BID  . pantoprazole  40 mg Oral Daily  . potassium chloride  40 mEq Oral Q4H  . sodium chloride flush  10-40 mL Intracatheter Q12H  . sodium chloride flush  3 mL Intravenous Q12H  . sodium chloride flush  3 mL Intravenous Q12H  . tamsulosin  0.4 mg Oral QPC supper    Continuous Infusions: . sodium chloride    . furosemide (LASIX) infusion 12 mg/hr (10/27/19 1110)  . magnesium sulfate bolus IVPB    . milrinone 0.5 mcg/kg/min (10/26/19 1129)    PRN Meds: sodium chloride, acetaminophen **OR** acetaminophen, antiseptic oral rinse, HYDROcodone-acetaminophen, ondansetron **OR** ondansetron (ZOFRAN) IV, polyethylene glycol, sodium chloride flush, sodium chloride flush  Physical Exam Vitals signs and nursing note  reviewed.  Constitutional:      Appearance: Normal appearance.  Skin:    General: Skin is warm and dry.  Neurological:     General: No focal deficit present.     Mental Status: He is alert and oriented to person, place, and time.  Psychiatric:        Mood and Affect: Mood normal.             Vital Signs: BP 98/65 (BP Location: Right Arm)   Pulse 85   Temp 98.4 F (36.9 C) (Oral)  Resp 18   Ht 6\' 1"  (1.854 m)   Wt 77.9 kg Comment: scale c  SpO2 97%   BMI 22.67 kg/m  SpO2: SpO2: 97 % O2 Device: O2 Device: Nasal Cannula O2 Flow Rate: O2 Flow Rate (L/min): 2 L/min  Intake/output summary:   Intake/Output Summary (Last 24 hours) at 10/27/2019 1121 Last data filed at 10/27/2019 0955 Gross per 24 hour  Intake 1073.49 ml  Output 2500 ml  Net -1426.51 ml   LBM: Last BM Date: 10/27/19 Baseline Weight: Weight: 80.2 kg Most recent weight: Weight: 77.9 kg(scale c)       Palliative Assessment/Data: PPS: 60%      Patient Active Problem List   Diagnosis Date Noted  . Advanced care planning/counseling discussion   . Goals of care, counseling/discussion   . Xerostomia   . Acute on chronic systolic congestive heart failure, NYHA class 4 ([place]) 10/23/2019  . Deep vein thrombosis (DVT) of right lower extremity ([place]) 10/23/2019  . Acute renal failure superimposed on stage 3a chronic kidney disease ([place]) 10/23/2019  . Chronic combined systolic and diastolic CHF (congestive heart failure) ([place]) 02/08/2019  . Cardiogenic shock ([place]) 02/01/2019  . Volume overload 02/01/2019  . Retroperitoneal bleed 02/01/2019  . Urinary tract infection due to Klebsiella species 02/01/2019  . Weakness generalized   . Tachypnea   . Sinus tachycardia   . Hyponatremia   . Acute blood loss anemia   . DNR (do not resuscitate) discussion   . Palliative care by specialist   . Malnutrition of moderate degree 01/06/2019  . CKD (chronic kidney disease) stage 3, GFR 30-59 ml/min 12/30/2018  . PVCs  (premature ventricular contractions) 12/30/2018  . Acute on chronic systolic congestive heart failure ([place]) 12/29/2018  . CHF (congestive heart failure) ([place]) 05/03/2018  . Acute on chronic systolic (congestive) heart failure ([place]) 05/03/2018  . Glucose intolerance (impaired glucose tolerance) 06/23/2016  . Vitreous floaters 12/08/2015  . TIA (transient ischemic attack) 12/08/2015  . SOB (shortness of breath) 12/08/2015  . Posterior vitreous detachment of both eyes 12/08/2015  . Pain due to dental caries 12/08/2015  . Other abnormal glucose 12/08/2015  . Osteoarthrosis 12/08/2015  . Long term (current) use of anticoagulants 12/08/2015  . Kidney mass 12/08/2015  . Hypersomnolence 12/08/2015  . Hyperopia with presbyopia of both eyes 12/08/2015  . Hyperlipidemia 12/08/2015  . History of orthopnea 12/08/2015  . COPD (chronic obstructive pulmonary disease) ([place]) 12/08/2015  . 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 AKI (acute kidney injury) ([place]) 12/08/2015  . Abnormal CT scan, kidney 12/08/2015  . Abdominal bloating 12/08/2015  . Biventricular ICD (implantable cardioverter-defibrillator) in place 12/07/2015  . Chest pain at rest 11/29/2015  . Essential hypertension 11/29/2015  . Acute on chronic systolic CHF (congestive heart failure), NYHA class 1 ([place]) 11/29/2015  . Gastroesophageal reflux disease without esophagitis 11/29/2015  . Pain in the chest   . Renal cancer ([place]) 08/16/2015  . 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 Cancer of kidney ([place]) 01/19/2015  . Hematuria 09/04/2014  . Psychosexual dysfunction with inhibited sexual excitement 07/29/2014  . Pelvic pain in male 07/29/2014  . Increased urinary frequency 07/29/2014  . Heartburn 07/29/2014  . EKG, abnormal 07/29/2014  . Corneal scar 07/29/2014  . 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 Cardiomyopathy ([place]) 07/29/2014  . CAD (coronary artery disease) 07/29/2014  . Disorder of kidney and ureter  04/09/2014    Palliative Care Assessment & Plan   Patient Profile: 78  y.o. 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  Workup reveals DVT R extremity, CHF exacerbation, acute on chronic renal failure.  Assessment/Recommendations/Plan   GOC- stabilize and discharge home with milrinone with Hospice support  Limited options for Hospice companies who will take patient on milrinone- Dr. Aundra Dubin previously indicated in former notes that [facility] may be able to take him with milrionone- care manager consulted  Goals of Care and Additional Recommendations:  Limitations on Scope of Treatment: Avoid Hospitalization  Code Status:  DNR  Prognosis:   < 6 months due to advanced heart failure, requiring increasing IV diuresis, inotrope dependent   Discharge Planning:  Home with Hospice  Care plan was discussed with patient.   Thank you for allowing the Palliative Medicine Team to assist in the care of this patient.   Time In: 1055 Time Out: 1130 Total Time 35 mins Prolonged Time Billed no      Greater than 50%  of this time was spent counseling and coordinating care related to the above assessment and plan.  Mariana Kaufman, AGNP-C Palliative Medicine   Please contact Palliative Medicine Team phone at [phone] for questions and concerns.

## 2019-10-27 NOTE — Progress Notes (Signed)
PROGRESS NOTE    Spencer Rice  OXB:[id number] DOB: 07/25/1941 DOA: 10/23/2019 PCP: [name]., MD    Brief Narrative:  78 y.o. 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  Patient reports that he felt slightly more short of breath than usual for the past couple days, but then worsened significantly overnight last night.  His dyspnea persisted through the morning and he went to the ED for evaluation.  Reports that his weight has been stable, denies fevers, reports increased leg swelling on the right, but denies any leg tenderness or chest pain.  He denies any dietary indiscretion.  Diuretic was reduced recently due to worsening renal function.  Assessment & Plan:   Principal Problem:   Deep vein thrombosis (DVT) of right lower extremity (HCC) Active Problems:   Chronic combined systolic and diastolic CHF (congestive heart failure) (HCC)   Acute renal failure superimposed on stage 3a chronic kidney disease ([place])   Advanced care planning/counseling discussion   Goals of care, counseling/discussion   Xerostomia   1. Right leg DVT  - Presents with SOB and increased leg swelling Rt>Lt, and is found to have RLE DVT  - He has hx of LLE DVT and mural thrombus, and had been anticoagulated until retroperitoneal bleed early this year -Initially started on IV heparin in ED, currently on eliquis and tolerated  2. Acute kidney injury superimposed on CKD III  - SCr is 3.07 on admission, up from 2.0 in September 2020  - Renal US reviewed, no evidence of obstructive disease - Thus far remains stable  3. Chronic combined systolic & diastolic CHF;  NSVT  - Presents with increased SOB, denies wt gain, no edema noted on CXR, ?SOB secondary to PE in light of his DVT and clear CXR but he reports improvement after Lasix 80 mg IV in ED  - EF was 5-10% on 10/22/19 and he is dependent on milrinone infusion  - He has NSVT, on amiodarone, ICD in place  - Cardiology following -Palliative Care is recommended by Cardiology. Continue full scope of treatment per pt wishes following Palliative Care meeting. Plans for home with hospice per Palliative Care  4. COPD  - No cough or wheezing on admission  - Continue Dulera as tolerated - remains on minimal O2 support  DVT prophylaxis: eliquis Code Status: Full Family Communication: Pt in room, family not at bedside Disposition Plan: Uncertain at this time  Consultants:   Cardiology  Palliative Care  Procedures:     Antimicrobials: Anti-infectives (From admission, onward)   None      Subjective: No complaints at this time  Objective: Vitals:   10/27/19 0841 10/27/19 0909 10/27/19 1100 10/27/19 1315  BP: 98/65   93/66  Pulse: 85   81  Resp: 18  19 20   Temp: 98.4 F (36.9 C)   98.4 F (36.9 C)  TempSrc: Oral   Oral  SpO2: 99% 97%  100%  Weight:      Height:        Intake/Output Summary (Last 24 hours) at 10/27/2019 1554 Last data filed at 10/27/2019 1401 Gross per 24 hour  Intake 662 ml  Output 2300 ml  Net -1638 ml  Filed Weights   10/25/19 0348 10/26/19 0424 10/27/19 0534  Weight: 76.7 kg 77.7 kg 77.9 kg    Examination: General exam: Awake, laying in bed, in nad Respiratory system: Normal respiratory effort, no wheezing Cardiovascular system: regular rate, s1, s2 Gastrointestinal system: Soft, nondistended, positive BS Central nervous system: CN2-12 grossly intact, strength intact Extremities: Perfused, no clubbing Skin: Normal skin turgor, no notable skin lesions seen Psychiatry: Mood normal // no visual hallucinations   Data Reviewed: I have personally  reviewed following labs and imaging studies  CBC: Recent Labs  Lab 10/23/19 1048 10/24/19 0420 10/25/19 0517 10/26/19 0532 10/27/19 0500  WBC 6.5 7.4 6.3 6.1 5.3  NEUTROABS 4.8  --   --   --   --   HGB 11.1* 10.0* 9.5* 10.1* 9.1*  HCT 36.5* 32.2* 30.6* 32.1* 29.2*  MCV 73.4* 71.9* 71.5* 71.5* 71.4*  PLT 140* 120* 120* 128* 564*   Basic Metabolic Panel: Recent Labs  Lab 10/24/19 0420 10/25/19 0517 10/26/19 0532 10/27/19 0500 10/27/19 0830 10/27/19 1422  NA 138 140 137 132*  --  130*  K 4.1 3.1* 3.7 2.9*  --  3.6  CL 99 101 100 95*  --  91*  CO2 25 27 26 29   --  28  GLUCOSE 110* 159* 127* 112*  --  179*  BUN 67* 54* 44* 43*  --  42*  CREATININE 2.89* 2.69* 2.21* 2.08*  --  2.19*  CALCIUM 9.2 8.9 8.9 8.5*  --  8.7*  MG 2.5* 2.5*  --   --  1.6*  --    GFR: Estimated Creatinine Clearance: 30.6 mL/min (A) (by C-G formula based on SCr of 2.19 mg/dL (H)). Liver Function Tests: Recent Labs  Lab 10/23/19 1048 10/24/19 0420 10/25/19 0517  AST 45* 33 32  ALT 21 18 19   ALKPHOS 110 103 100  BILITOT 2.7* 2.4* 2.1*  PROT 7.7 6.5 6.3*  ALBUMIN 3.7 3.1* 2.9*   No results for input(s): LIPASE, AMYLASE in the last 168 hours. No results for input(s): AMMONIA in the last 168 hours. Coagulation Profile: Recent Labs  Lab 10/23/19 1115  INR 1.4*   Cardiac Enzymes: No results for input(s): CKTOTAL, CKMB, CKMBINDEX, TROPONINI in the last 168 hours. BNP (last 3 results) No results for input(s): PROBNP in the last 8760 hours. HbA1C: No results for input(s): HGBA1C in the last 72 hours. CBG: Recent Labs  Lab 10/26/19 0634  GLUCAP 121*   Lipid Profile: No results for input(s): CHOL, HDL, LDLCALC, TRIG, CHOLHDL, LDLDIRECT in the last 72 hours. Thyroid Function Tests: No results for input(s): TSH, T4TOTAL, FREET4, T3FREE, THYROIDAB in the last 72 hours. Anemia Panel: No results for input(s): VITAMINB12, FOLATE, FERRITIN, TIBC, IRON, RETICCTPCT in the last 72 hours. Sepsis  Labs: No results for input(s): PROCALCITON, LATICACIDVEN in the last 168 hours.  Recent Results (from the past 240 hour(s))  SARS CORONAVIRUS 2 (TAT 6-24 HRS) Nasopharyngeal Nasopharyngeal Swab     Status: None   Collection Time: 10/23/19  2:06 PM   Specimen: Nasopharyngeal Swab  Result Value Ref Range Status   SARS Coronavirus 2 NEGATIVE NEGATIVE Final    Comment: (NOTE) SARS-CoV-2 target nucleic acids are NOT DETECTED. The SARS-CoV-2 RNA is generally detectable in upper and lower respiratory specimens during the acute phase of infection. Negative results do not preclude SARS-CoV-2 infection, do not rule out co-infections with other pathogens, and should not be used as the sole basis for treatment or other patient management decisions. Negative  results must be combined with clinical observations, patient history, and epidemiological information. The expected result is Negative. Fact Sheet for Patients: SugarRoll.be Fact Sheet for Healthcare Providers: https://www.woods-mathews.com/ This test is not yet approved or cleared by the [place] FDA and  has been authorized for detection and/or diagnosis of SARS-CoV-2 by FDA under an Emergency Use Authorization (EUA). This EUA will remain  in effect (meaning this test can be used) for the duration of the COVID-19 declaration under Section 56 4(b)(1) of the Act, 21 U.S.C. section 360bbb-3(b)(1), unless the authorization is terminated or revoked sooner. Performed at [facility], [place] [address] [place], [place] [address]      Radiology Studies: No results found.  Scheduled Meds: . amiodarone  200 mg Oral Daily  . apixaban  10 mg Oral BID   Followed by  . [START ON 10/30/2019] apixaban  5 mg Oral BID  . Chlorhexidine Gluconate Cloth  6 each Topical Daily  . mometasone-formoterol  2 puff Inhalation BID  . pantoprazole  40 mg Oral Daily  . sodium chloride flush  10-40 mL  Intracatheter Q12H  . sodium chloride flush  3 mL Intravenous Q12H  . sodium chloride flush  3 mL Intravenous Q12H  . tamsulosin  0.4 mg Oral QPC supper   Continuous Infusions: . sodium chloride    . furosemide (LASIX) infusion 12 mg/hr (10/27/19 1110)  . milrinone 0.5 mcg/kg/min (10/26/19 1129)     LOS: 4 days   Marylu Lund, MD [facility] Pager On Amion  If 7PM-7AM, please contact night-coverage 10/27/2019, 3:54 PM

## 2019-10-27 NOTE — TOC Initial Note (Addendum)
Transition of Care [facility]) - Initial/Assessment Note    Patient Details  Name: Spencer Rice MRN: [id number] Date of Birth: 1941-08-04  Transition of Care [facility]) CM/SW Contact:    Zenon Mayo, RN Phone Number: 10/27/2019, 1:29 PM  Clinical Narrative:                 NCM spoke with patient, offered choice for home with Hospice, informed patient that he will need home milrinone , [facility] hosp[facility] can do this per patient's MD.  Patient states that he would like to work with [facility].  Referral given to [facility] with [facility].  Patient states he has walker and a cane at home and home oxygen 2 liters with [employer], states his son lives with him and his son will be his transport home.  [facility] will be in contact with patient.  Expected Discharge Plan: Home w Hospice Care Barriers to Discharge: No Barriers Identified   Patient Goals and CMS Choice Patient states their goals for this hospitalization and ongoing recovery are:: home with hospice CMS Medicare.gov Compare Post Acute Care list provided to:: Patient Choice offered to / list presented to : Patient  Expected Discharge Plan and Services Expected Discharge Plan: Home w Hospice Care In-house Referral: Hospice / Palliative Care Discharge Planning Services: CM Consult Post Acute Care Choice: Hospice Living arrangements for the past 2 months: Single Family Home                 DME Arranged: (NA)         HH Arranged: RN(IV milrinone drip) HH Agency: ([facility]) Date [facility] Agency Contacted: 10/27/19 Time HH Agency Contacted: [place] Representative spoke with at [place]: [facility]  Prior Living Arrangements/Services Living arrangements for the past 2 months: [place] with:: Adult Children Patient language and need for interpreter reviewed:: Yes Do you feel safe going back to the place where you live?: Yes      Need for Family Participation in Patient Care: Yes (Comment) Care giver support system in place?:  Yes (comment) Current home services: DME(has walker , cane) Criminal Activity/Legal Involvement Pertinent to Current Situation/Hospitalization: No - Comment as needed  Activities of Daily Living Home Assistive Devices/Equipment: Oxygen, Walker (specify type), Cane (specify quad or straight) ADL Screening (condition at time of admission) Patient's cognitive ability adequate to safely complete daily activities?: Yes Is the patient deaf or have difficulty hearing?: No Does the patient have difficulty seeing, even when wearing glasses/contacts?: No Does the patient have difficulty concentrating, remembering, or making decisions?: No Patient able to express need for assistance with ADLs?: Yes Does the patient have difficulty dressing or bathing?: No Independently performs ADLs?: Yes (appropriate for developmental age) Does the patient have difficulty walking or climbing stairs?: Yes Weakness of Legs: None Weakness of Arms/Hands: None  Permission Sought/Granted                  Emotional Assessment Appearance:: Appears stated age Attitude/Demeanor/Rapport: Engaged Affect (typically observed): Appropriate Orientation: : Oriented to Self, Oriented to Place, Oriented to  Time, Oriented to Situation Alcohol / Substance Use: Not Applicable Psych Involvement: No (comment)  Admission diagnosis:  Ventricular tachycardia (paroxysmal) (HCC) [I47.2] Acute deep vein thrombosis (DVT) of proximal vein of right lower extremity (HCC) [I82.4Y1] Acute renal failure superimposed on chronic kidney disease, unspecified CKD stage, unspecified acute renal failure type ([place]) [N17.9, N18.9] Acute on chronic congestive heart failure, unspecified heart failure type ([place]) [I50.9] Patient Active Problem List   Diagnosis  Date Noted  . Advanced care planning/counseling discussion   . Goals of care, counseling/discussion   . Xerostomia   . Acute on chronic systolic congestive heart failure, NYHA class 4 ([place])  10/23/2019  . Deep vein thrombosis (DVT) of right lower extremity ([place]) 10/23/2019  . Acute renal failure superimposed on stage 3a chronic kidney disease ([place]) 10/23/2019  . Chronic combined systolic and diastolic CHF (congestive heart failure) ([place]) 02/08/2019  . Cardiogenic shock ([place]) 02/01/2019  . Volume overload 02/01/2019  . Retroperitoneal bleed 02/01/2019  . Urinary tract infection due to Klebsiella species 02/01/2019  . Weakness generalized   . Tachypnea   . Sinus tachycardia   . Hyponatremia   . Acute blood loss anemia   . DNR (do not resuscitate) discussion   . Palliative care by specialist   . Malnutrition of moderate degree 01/06/2019  . CKD (chronic kidney disease) stage 3, GFR 30-59 ml/min 12/30/2018  . PVCs (premature ventricular contractions) 12/30/2018  . Acute on chronic systolic congestive heart failure ([place]) 12/29/2018  . CHF (congestive heart failure) ([place]) 05/03/2018  . Acute on chronic systolic (congestive) heart failure ([place]) 05/03/2018  . Glucose intolerance (impaired glucose tolerance) 06/23/2016  . Vitreous floaters 12/08/2015  . TIA (transient ischemic attack) 12/08/2015  . SOB (shortness of breath) 12/08/2015  . Posterior vitreous detachment of both eyes 12/08/2015  . Pain due to dental caries 12/08/2015  . Other abnormal glucose 12/08/2015  . Osteoarthrosis 12/08/2015  . Long term (current) use of anticoagulants 12/08/2015  . Kidney mass 12/08/2015  . Hypersomnolence 12/08/2015  . Hyperopia with presbyopia of both eyes 12/08/2015  . Hyperlipidemia 12/08/2015  . History of orthopnea 12/08/2015  . COPD (chronic obstructive pulmonary disease) ([place]) 12/08/2015  . 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 AKI (acute kidney injury) ([place]) 12/08/2015  . Abnormal CT scan, kidney 12/08/2015  . Abdominal bloating 12/08/2015  . Biventricular ICD (implantable cardioverter-defibrillator) in place 12/07/2015  . Chest pain at rest 11/29/2015  . Essential  hypertension 11/29/2015  . Acute on chronic systolic CHF (congestive heart failure), NYHA class 1 ([place]) 11/29/2015  . Gastroesophageal reflux disease without esophagitis 11/29/2015  . Pain in the chest   . Renal cancer ([place]) 08/16/2015  . 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 Cancer of kidney ([place]) 01/19/2015  . Hematuria 09/04/2014  . Psychosexual dysfunction with inhibited sexual excitement 07/29/2014  . Pelvic pain in male 07/29/2014  . Increased urinary frequency 07/29/2014  . Heartburn 07/29/2014  . EKG, abnormal 07/29/2014  . Corneal scar 07/29/2014  . 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 Cardiomyopathy ([place]) 07/29/2014  . CAD (coronary artery disease) 07/29/2014  . Disorder of kidney and ureter 04/09/2014   PCP:  [name]., MD Pharmacy:   [id number] - [place], [place]. AT [address] [place] [address] Phone: [phone] Fax: [phone]     Social Determinants of Health (SDOH) Interventions    Readmission Risk Interventions Readmission Risk Prevention Plan 10/27/2019  Transportation Screening Complete  Medication Review Press photographer) Complete  PCP or Specialist appointment within 3-5 days of discharge Complete  HRI or [place] Complete  SW Recovery Care/Counseling Consult Complete  Palliative Care Screening Complete  [place] Not Applicable  Some recent data might be hidden

## 2019-10-27 NOTE — Care Management Important Message (Signed)
Important Message  Patient Details  Name: Spencer Rice MRN: [id number] Date of Birth: 10/28/1941   Medicare Important Message Given:  Yes     Shelda Altes 10/27/2019, 11:50 AM

## 2019-10-27 NOTE — Progress Notes (Signed)
Patient is experiencing cramps with low potasium 2.9 Magnesium 1.6 MD and with orders to replace Via oral and IV. [facility] ordered

## 2019-10-28 ENCOUNTER — Other Ambulatory Visit (HOSPITAL_COMMUNITY): Payer: Self-pay | Admitting: Internal Medicine

## 2019-10-28 DIAGNOSIS — I5023 Acute on chronic systolic (congestive) heart failure: Secondary | ICD-10-CM

## 2019-10-28 LAB — CBC
HCT: 30.5 % — ABNORMAL LOW (ref 39.0–52.0)
Hemoglobin: 9.6 g/dL — ABNORMAL LOW (ref 13.0–17.0)
MCH: 22.4 pg — ABNORMAL LOW (ref 26.0–34.0)
MCHC: 31.5 g/dL (ref 30.0–36.0)
MCV: 71.3 fL — ABNORMAL LOW (ref 80.0–100.0)
Platelets: 118 10*3/uL — ABNORMAL LOW (ref 150–400)
RBC: 4.28 MIL/uL (ref 4.22–5.81)
RDW: 19.1 % — ABNORMAL HIGH (ref 11.5–15.5)
WBC: 5.4 10*3/uL (ref 4.0–10.5)
nRBC: 0.7 % — ABNORMAL HIGH (ref 0.0–0.2)

## 2019-10-28 LAB — COOXEMETRY PANEL
Carboxyhemoglobin: 1.2 % (ref 0.5–1.5)
Carboxyhemoglobin: 1.5 % (ref 0.5–1.5)
Methemoglobin: 0.8 % (ref 0.0–1.5)
Methemoglobin: 1.2 % (ref 0.0–1.5)
O2 Saturation: 44 %
O2 Saturation: 38.4 %
Total hemoglobin: 9.7 g/dL — ABNORMAL LOW (ref 12.0–16.0)
Total hemoglobin: 9.8 g/dL — ABNORMAL LOW (ref 12.0–16.0)

## 2019-10-28 LAB — BASIC METABOLIC PANEL
Anion gap: 13 (ref 5–15)
BUN: 45 mg/dL — ABNORMAL HIGH (ref 8–23)
CO2: 27 mmol/L (ref 22–32)
Calcium: 9 mg/dL (ref 8.9–10.3)
Chloride: 93 mmol/L — ABNORMAL LOW (ref 98–111)
Creatinine, Ser: 2.47 mg/dL — ABNORMAL HIGH (ref 0.61–1.24)
GFR calc Af Amer: 28 mL/min — ABNORMAL LOW (ref >60)
GFR calc non Af Amer: 24 mL/min — ABNORMAL LOW (ref >60)
Glucose, Bld: 132 mg/dL — ABNORMAL HIGH (ref 70–99)
Potassium: 3.5 mmol/L (ref 3.5–5.1)
Sodium: 133 mmol/L — ABNORMAL LOW (ref 135–145)

## 2019-10-28 LAB — MAGNESIUM: Magnesium: 2.7 mg/dL — ABNORMAL HIGH (ref 1.7–2.4)

## 2019-10-28 MED ORDER — POTASSIUM CHLORIDE CRYS ER 20 MEQ PO TBCR
40.0000 meq | EXTENDED_RELEASE_TABLET | ORAL | Status: AC
  Administered 2019-10-28 (×4): 40 meq via ORAL
  Filled 2019-10-28 (×4): qty 2

## 2019-10-28 MED ORDER — GUAIFENESIN-DM 100-10 MG/5ML PO SYRP
10.0000 mL | ORAL_SOLUTION | ORAL | Status: DC | PRN
  Administered 2019-10-28: 10 mL via ORAL
  Filled 2019-10-28: qty 10

## 2019-10-28 MED ORDER — METOLAZONE 2.5 MG PO TABS
2.5000 mg | ORAL_TABLET | Freq: Once | ORAL | Status: AC
  Administered 2019-10-28: 2.5 mg via ORAL
  Filled 2019-10-28: qty 1

## 2019-10-28 NOTE — Progress Notes (Addendum)
Patient ID: Spencer Rice, male   DOB: 06-Jan-1941, 78 y.o.   MRN: [id number]     Advanced Heart Failure Rounding Note  PCP-Cardiologist: No primary care provider on file.   Subjective:    Remains on milrinone 0.5 mcg. Co-ox down to 44%. Creatinine trending up to 2.2>2.5.   Has RLE DVT, now on Eliquis.   Denies SOB. Says he is not making much urine. Unable to wear ted hose at night.     Objective:   Weight Range: 78.4 kg Body mass index is 22.8 kg/m.   Vital Signs:   Temp:  [98 F (36.7 C)-98.4 F (36.9 C)] 98.2 F (36.8 C) (11/24 0542) Pulse Rate:  [81-89] 87 (11/24 0542) Resp:  [16-22] 18 (11/24 0600) BP: (89-101)/(65-71) 99/66 (11/24 0542) SpO2:  [97 %-100 %] 98 % (11/24 0542) Weight:  [78.4 kg] 78.4 kg (11/24 0500) Last BM Date: 10/27/19  Weight change: Filed Weights   10/26/19 0424 10/27/19 0534 10/28/19 0500  Weight: 77.7 kg 77.9 kg 78.4 kg    Intake/Output:   Intake/Output Summary (Last 24 hours) at 10/28/2019 0821 Last data filed at 10/28/2019 0500 Gross per 24 hour  Intake 1022 ml  Output 950 ml  Net 72 ml      Physical Exam   CVP 25-26 General:  No resp difficulty HEENT: normal Neck: supple. JVP to jaw.  Carotids 2+ bilat; no bruits. No lymphadenopathy or thryomegaly appreciated. Cor: PMI nondisplaced. Regular rate & rhythm. No rubs, 3/6 HSM LLSB  +S3  Lungs: clear Abdomen: soft, nontender, nondistended. No hepatosplenomegaly. No bruits or masses. Good bowel sounds. Extremities: no cyanosis, clubbing, rash, R and LLE 2+ edema Neuro: alert & orientedx3, cranial nerves grossly intact. moves all 4 extremities w/o difficulty. Affect pleasant   Telemetry  SR with PVCs NSVT  EKG    N/A  Labs    CBC Recent Labs    10/27/19 0500 10/28/19 0435  WBC 5.3 5.4  HGB 9.1* 9.6*  HCT 29.2* 30.5*  MCV 71.4* 71.3*  PLT 121* 366*   Basic Metabolic Panel Recent Labs    10/27/19 0830 10/27/19 1422 10/28/19 0435  NA  --  130* 133*  K  --   3.6 3.5  CL  --  91* 93*  CO2  --  28 27  GLUCOSE  --  179* 132*  BUN  --  42* 45*  CREATININE  --  2.19* 2.47*  CALCIUM  --  8.7* 9.0  MG 1.6*  --  2.7*   Liver Function Tests No results for input(s): AST, ALT, ALKPHOS, BILITOT, PROT, ALBUMIN in the last 72 hours. No results for input(s): LIPASE, AMYLASE in the last 72 hours. Cardiac Enzymes No results for input(s): CKTOTAL, CKMB, CKMBINDEX, TROPONINI in the last 72 hours.  BNP: BNP (last 3 results) Recent Labs    02/06/19 1850 08/21/19 2013 10/23/19 1048  BNP 2,172.9* 2,515.0* 3,200.7*    ProBNP (last 3 results) No results for input(s): PROBNP in the last 8760 hours.   D-Dimer No results for input(s): DDIMER in the last 72 hours. Hemoglobin A1C No results for input(s): HGBA1C in the last 72 hours. Fasting Lipid Panel No results for input(s): CHOL, HDL, LDLCALC, TRIG, CHOLHDL, LDLDIRECT in the last 72 hours. Thyroid Function Tests No results for input(s): TSH, T4TOTAL, T3FREE, THYROIDAB in the last 72 hours.  Invalid input(s): FREET3  Other results:   Imaging    No results found.   Medications:     Scheduled Medications: .  amiodarone  200 mg Oral Daily  . apixaban  10 mg Oral BID   Followed by  . [START ON 10/30/2019] apixaban  5 mg Oral BID  . Chlorhexidine Gluconate Cloth  6 each Topical Daily  . mometasone-formoterol  2 puff Inhalation BID  . pantoprazole  40 mg Oral Daily  . sodium chloride flush  10-40 mL Intracatheter Q12H  . sodium chloride flush  3 mL Intravenous Q12H  . sodium chloride flush  3 mL Intravenous Q12H  . tamsulosin  0.4 mg Oral QPC supper    Infusions: . sodium chloride    . furosemide (LASIX) infusion 12 mg/hr (10/27/19 1110)  . milrinone 0.5 mcg/kg/min (10/28/19 0036)    PRN Medications: sodium chloride, acetaminophen **OR** acetaminophen, antiseptic oral rinse, guaiFENesin-dextromethorphan, HYDROcodone-acetaminophen, ondansetron **OR** ondansetron (ZOFRAN) IV,  polyethylene glycol, sodium chloride flush, sodium chloride flush    Assessment/Plan   1. 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 [place] 2015 with moderate disease. Has Bi[employer] BiV ICD. - ECHO 10/22/19 EF severely EF 5-10% with severe MR/TR, biatrial enlargement, RV with moderately reduced function - Admitted with volume overload and recurrent low output HF despite home milrinone at 0.25 mcg.   - Milrinone increased to 0.5 mcg/kg/min.  Co-ox back down to 44%. Repeat now. If still low will consider dobutamine.   CVP 26. Increase lasix to 15 mg per hour and give 2.5 mg metolazone now.  - Continue ted hose as tolerated.   - - SBP soft, probably no room for hydralazine/imdur.  - No bb with low output - No spironolactone, dig, arb with AKI - Follow renal function closely.   2. AKI on CKD Stage IIIb: Creatinine baseline 1.8-2.2 - Creatinine better today, 2.89 => 2.69 => 2.21=>2.1=>2.5  Follow BMET daily.   3. H/O RP bleed  - was on [facility] due to LV clot but stopped.   4. Acute RLE DVT -Initially on heparin but switched to Eliquis  10 mg bid until 11/25 followed by eliquis 5 mg twice a day.  Hgb stable 9.6 .   5. Hypokalemia  K 3.5.  - Increase potassium 40 meq every 4 x4   6. NSVT On amio 200 mg po daily.  - Would like to keep K 4 Mag 2.7 today.    7.DNR  Now ok with Hospice - I discussed deactivating ICD. Dr Haroldine Laws discussed with him. He would like to deactivate his ICD. [employer] contacted.   He wants to go home tomorrow with Hospice.   Length of Stay: 5  Amy Clegg, NP  10/28/2019, 8:21 AM  Advanced Heart Failure Team Pager [phone] (M-F; 7a - 4p)  Please contact [facility] for night-coverage after hours (4p -7a ) and weekends on [email or web address]  Patient seen and examined with the above-signed Advanced Practice Provider and/or Housestaff. I personally reviewed laboratory data, imaging studies and  relevant notes. I independently examined the patient and formulated the important aspects of the plan. I have edited the note to reflect any of my changes or salient points. I have personally discussed the plan with the patient and/or family.  Co-ox remains very low despite full-dose full dose milrinone. I think he is actively dying from HF. I had long talk with him and his family about this. He has met with Palliative Care team and wants to proceed with home Hospice. Plan d/c in am. We will continue milrinone for palliative purposes. Suspect he may only have weeks  to live. ICD deactivated. Continue IV diuresis today. On Eliquis for DVT.   Glori Bickers, MD  4:16 PM

## 2019-10-28 NOTE — Progress Notes (Signed)
PROGRESS NOTE    Spencer Rice  JIR:[id number] DOB: 11-22-1941 DOA: 10/23/2019 PCP: [name]., MD    Brief Narrative:  78 y.o. 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  Patient reports that he felt slightly more short of breath than usual for the past couple days, but then worsened significantly overnight last night.  His dyspnea persisted through the morning and he went to the ED for evaluation.  Reports that his weight has been stable, denies fevers, reports increased leg swelling on the right, but denies any leg tenderness or chest pain.  He denies any dietary indiscretion.  Diuretic was reduced recently due to worsening renal function.  Assessment & Plan:   Principal Problem:   Deep vein thrombosis (DVT) of right lower extremity (HCC) Active Problems:   Chronic combined systolic and diastolic CHF (congestive heart failure) (HCC)   Acute renal failure superimposed on stage 3a chronic kidney disease ([place])   Advanced care planning/counseling discussion   Goals of care, counseling/discussion   Xerostomia   1. Right leg DVT  - Presents with SOB and increased leg swelling Rt>Lt, and is found to have RLE DVT  - He has hx of LLE DVT and mural thrombus, and had been anticoagulated until retroperitoneal bleed early this year -Initially started on IV heparin in ED, now on eliquis and tolerating  2. Acute kidney injury superimposed on CKD III  - SCr is 3.07 on admission, up from 2.0 in September 2020  - Renal US reviewed, no evidence of obstructive disease - Cr had been trending down, now back up to 2.47  3. Chronic combined systolic &  diastolic CHF; NSVT  - Presents with increased SOB, denies wt gain, no edema noted on CXR, ?SOB secondary to PE in light of his DVT and clear CXR but he reports improvement after Lasix 80 mg IV in ED  - EF was 5-10% on 10/22/19 and he is dependent on milrinone infusion  - He has NSVT, on amiodarone, ICD in place  - Cardiology following -Planning for home with hospice in 24hrs with plan to deactivate ICD. Case manager following  4. COPD  - No cough or wheezing on admission  - Continue Dulera as tolerated - remains on minimal O2 support  DVT prophylaxis: eliquis Code Status: Full Family Communication: Pt in room, family not at bedside Disposition Plan: Uncertain at this time  Consultants:   Cardiology  Palliative Care  Procedures:     Antimicrobials: Anti-infectives (From admission, onward)   None      Subjective: Without complaints at this time  Objective: Vitals:   10/28/19 0845 10/28/19 0922 10/28/19 1300 10/28/19 1411  BP: 97/74  90/66 94/67  Pulse:    80  Resp:   19 18  Temp:   98.3 F (36.8 C) 97.7 F (36.5 C)  TempSrc:   Oral Oral  SpO2: 97% 98% 96% 100%  Weight:      Height:        Intake/Output Summary (Last 24 hours) at 10/28/2019 1456 Last data filed at 10/28/2019 1254 Gross per 24 hour  Intake 1060 ml  Output 950 ml  Net 110 ml   [employer]  10/26/19 0424 10/27/19 0534 10/28/19 0500  Weight: 77.7 kg 77.9 kg 78.4 kg    Examination: General exam: Conversant, in no acute distress Respiratory system: normal chest rise, clear, no audible wheezing Cardiovascular system: regular rhythm, s1-s2 Gastrointestinal system: Nondistended, nontender, pos BS Central nervous system: No seizures, no tremors Extremities: No cyanosis, no joint deformities Skin: No rashes, no pallor Psychiatry: Affect normal // no auditory hallucinations   Data Reviewed: I have personally reviewed following labs and imaging studies  CBC: Recent Labs  Lab 10/23/19  1048 10/24/19 0420 10/25/19 0517 10/26/19 0532 10/27/19 0500 10/28/19 0435  WBC 6.5 7.4 6.3 6.1 5.3 5.4  NEUTROABS 4.8  --   --   --   --   --   HGB 11.1* 10.0* 9.5* 10.1* 9.1* 9.6*  HCT 36.5* 32.2* 30.6* 32.1* 29.2* 30.5*  MCV 73.4* 71.9* 71.5* 71.5* 71.4* 71.3*  PLT 140* 120* 120* 128* 121* 403*   Basic Metabolic Panel: Recent Labs  Lab 10/24/19 0420 10/25/19 0517 10/26/19 0532 10/27/19 0500 10/27/19 0830 10/27/19 1422 10/28/19 0435  NA 138 140 137 132*  --  130* 133*  K 4.1 3.1* 3.7 2.9*  --  3.6 3.5  CL 99 101 100 95*  --  91* 93*  CO2 25 27 26 29   --  28 27  GLUCOSE 110* 159* 127* 112*  --  179* 132*  BUN 67* 54* 44* 43*  --  42* 45*  CREATININE 2.89* 2.69* 2.21* 2.08*  --  2.19* 2.47*  CALCIUM 9.2 8.9 8.9 8.5*  --  8.7* 9.0  MG 2.5* 2.5*  --   --  1.6*  --  2.7*   GFR: Estimated Creatinine Clearance: 27.3 mL/min (A) (by C-G formula based on SCr of 2.47 mg/dL (H)). Liver Function Tests: Recent Labs  Lab 10/23/19 1048 10/24/19 0420 10/25/19 0517  AST 45* 33 32  ALT 21 18 19   ALKPHOS 110 103 100  BILITOT 2.7* 2.4* 2.1*  PROT 7.7 6.5 6.3*  ALBUMIN 3.7 3.1* 2.9*   No results for input(s): LIPASE, AMYLASE in the last 168 hours. No results for input(s): AMMONIA in the last 168 hours. Coagulation Profile: Recent Labs  Lab 10/23/19 1115  INR 1.4*   Cardiac Enzymes: No results for input(s): CKTOTAL, CKMB, CKMBINDEX, TROPONINI in the last 168 hours. BNP (last 3 results) No results for input(s): PROBNP in the last 8760 hours. HbA1C: No results for input(s): HGBA1C in the last 72 hours. CBG: Recent Labs  Lab 10/26/19 0634  GLUCAP 121*   Lipid Profile: No results for input(s): CHOL, HDL, LDLCALC, TRIG, CHOLHDL, LDLDIRECT in the last 72 hours. Thyroid Function Tests: No results for input(s): TSH, T4TOTAL, FREET4, T3FREE, THYROIDAB in the last 72 hours. Anemia Panel: No results for input(s): VITAMINB12, FOLATE, FERRITIN, TIBC, IRON, RETICCTPCT in the  last 72 hours. Sepsis Labs: No results for input(s): PROCALCITON, LATICACIDVEN in the last 168 hours.  Recent Results (from the past 240 hour(s))  SARS CORONAVIRUS 2 (TAT 6-24 HRS) Nasopharyngeal Nasopharyngeal Swab     Status: None   Collection Time: 10/23/19  2:06 PM   Specimen: Nasopharyngeal Swab  Result Value Ref Range Status   SARS Coronavirus 2 NEGATIVE NEGATIVE Final    Comment: (NOTE) SARS-CoV-2 target nucleic acids are NOT DETECTED. The SARS-CoV-2 RNA is generally detectable in upper and lower respiratory specimens during the acute phase of infection. Negative results do not preclude SARS-CoV-2 infection, do not rule out co-infections with other pathogens, and should not  be used as the sole basis for treatment or other patient management decisions. Negative results must be combined with clinical observations, patient history, and epidemiological information. The expected result is Negative. Fact Sheet for Patients: SugarRoll.be Fact Sheet for Healthcare Providers: https://www.woods-mathews.com/ This test is not yet approved or cleared by the [place] FDA and  has been authorized for detection and/or diagnosis of SARS-CoV-2 by FDA under an Emergency Use Authorization (EUA). This EUA will remain  in effect (meaning this test can be used) for the duration of the COVID-19 declaration under Section 56 4(b)(1) of the Act, 21 U.S.C. section 360bbb-3(b)(1), unless the authorization is terminated or revoked sooner. Performed at [facility], [place] [address] [place], [place] [address]      Radiology Studies: No results found.  Scheduled Meds: . amiodarone  200 mg Oral Daily  . apixaban  10 mg Oral BID   Followed by  . [START ON 10/30/2019] apixaban  5 mg Oral BID  . Chlorhexidine Gluconate Cloth  6 each Topical Daily  . mometasone-formoterol  2 puff Inhalation BID  . pantoprazole  40 mg Oral Daily  . potassium chloride   40 mEq Oral Q4H  . sodium chloride flush  10-40 mL Intracatheter Q12H  . sodium chloride flush  3 mL Intravenous Q12H  . sodium chloride flush  3 mL Intravenous Q12H  . tamsulosin  0.4 mg Oral QPC supper   Continuous Infusions: . sodium chloride    . furosemide (LASIX) infusion 15 mg/hr (10/28/19 1252)  . milrinone 0.5 mcg/kg/min (10/28/19 0841)     LOS: 5 days   Marylu Lund, MD [facility] Pager On Amion  If 7PM-7AM, please contact night-coverage 10/28/2019, 2:56 PM

## 2019-10-28 NOTE — Progress Notes (Addendum)
Patient refusing chair alarm pad at this time but states he will call when he is ready to get into bed.   2213-pt refuses bed alarm

## 2019-10-28 NOTE — TOC Transition Note (Signed)
Transition of Care [facility]) - CM/SW Discharge Note   Patient Details  Name: Spencer Rice MRN: [id number] Date of Birth: 25-Dec-1940  Transition of Care [facility]) CM/SW Contact:  Zenon Mayo, RN Phone Number: 10/28/2019, 3:23 PM   Clinical Narrative:    NCM informed Dorian Pod with [facility] that orders are in for milriinone, she states she will pull the orders , informed her that patient is for dc tomorrow.  NCM also informed Pam with [facility].  The patient brought the bag of milrinone to the hospital and the RN locked it up, will need to be sure he gets this at discharge. Amy with HF is aware to do orders for milrinone for [employer].   Final next level of care: Home w Hospice Care Barriers to Discharge: No Barriers Identified   Patient Goals and CMS Choice Patient states their goals for this hospitalization and ongoing recovery are:: home with hospice CMS Medicare.gov Compare Post Acute Care list provided to:: Patient Choice offered to / list presented to : Patient  Discharge Placement                       Discharge Plan and Services In-house Referral: Hospice / Palliative Care Discharge Planning Services: CM Consult Post Acute Care Choice: Hospice          DME Arranged: (NA)         HH Arranged: RN [place] Agency: ([place]) Date [place]: 10/27/19 Time [place]: [id number] Representative spoke with at [place]: [place] ([place]) Interventions     Readmission Risk Interventions Readmission Risk Prevention Plan 10/27/2019  Transportation Screening Complete  Medication Review Press photographer) Complete  PCP or Specialist appointment within 3-5 days of discharge Complete  HRI or [place] Complete  SW Recovery Care/Counseling Consult Complete  [place] Not Applicable  Some recent data might be hidden

## 2019-10-29 DIAGNOSIS — I472 Ventricular tachycardia: Secondary | ICD-10-CM

## 2019-10-29 LAB — COOXEMETRY PANEL
Carboxyhemoglobin: 1.3 % (ref 0.5–1.5)
Methemoglobin: 0.6 % (ref 0.0–1.5)
O2 Saturation: 43.3 %
Total hemoglobin: 9.7 g/dL — ABNORMAL LOW (ref 12.0–16.0)

## 2019-10-29 LAB — BASIC METABOLIC PANEL
Anion gap: 12 (ref 5–15)
BUN: 43 mg/dL — ABNORMAL HIGH (ref 8–23)
CO2: 27 mmol/L (ref 22–32)
Calcium: 8.7 mg/dL — ABNORMAL LOW (ref 8.9–10.3)
Chloride: 95 mmol/L — ABNORMAL LOW (ref 98–111)
Creatinine, Ser: 2.18 mg/dL — ABNORMAL HIGH (ref 0.61–1.24)
GFR calc Af Amer: 32 mL/min — ABNORMAL LOW (ref >60)
GFR calc non Af Amer: 28 mL/min — ABNORMAL LOW (ref >60)
Glucose, Bld: 132 mg/dL — ABNORMAL HIGH (ref 70–99)
Potassium: 3.6 mmol/L (ref 3.5–5.1)
Sodium: 134 mmol/L — ABNORMAL LOW (ref 135–145)

## 2019-10-29 LAB — GLUCOSE, CAPILLARY: Glucose-Capillary: 194 mg/dL — ABNORMAL HIGH (ref 70–99)

## 2019-10-29 MED ORDER — MILRINONE LACTATE IN DEXTROSE 20-5 MG/100ML-% IV SOLN: 0.5000 ug/kg/min | INTRAVENOUS | 6 refills | Status: DC

## 2019-10-29 MED ORDER — TORSEMIDE 20 MG PO TABS
60.0000 mg | ORAL_TABLET | Freq: Two times a day (BID) | ORAL | Status: DC
  Administered 2019-10-29: 60 mg via ORAL
  Filled 2019-10-29: qty 3

## 2019-10-29 MED ORDER — APIXABAN 5 MG PO TABS: 5.0000 mg | ORAL_TABLET | Freq: Two times a day (BID) | ORAL | 6 refills | Status: AC

## 2019-10-29 MED ORDER — METOLAZONE 2.5 MG PO TABS: 2.5000 mg | ORAL_TABLET | ORAL | 3 refills | Status: DC

## 2019-10-29 MED ORDER — POTASSIUM CHLORIDE CRYS ER 20 MEQ PO TBCR
40.0000 meq | EXTENDED_RELEASE_TABLET | ORAL | Status: DC
  Administered 2019-10-29: 40 meq via ORAL
  Filled 2019-10-29: qty 2

## 2019-10-29 MED ORDER — TORSEMIDE 20 MG PO TABS: 60.0000 mg | ORAL_TABLET | Freq: Two times a day (BID) | ORAL | 3 refills | Status: DC

## 2019-10-29 MED ORDER — METOLAZONE 2.5 MG PO TABS: 2.5000 mg | ORAL_TABLET | ORAL | Status: DC

## 2019-10-29 NOTE — Progress Notes (Signed)
BMP and Coox sent to lab

## 2019-10-29 NOTE — Progress Notes (Signed)
CCMD notified of 12 beats of VT.  Patient asymptomatic.  TRH notified.

## 2019-10-29 NOTE — Progress Notes (Addendum)
Patient ID: Spencer Rice, male   DOB: Feb 04, 1941, 78 y.o.   MRN: [id number]     Advanced Heart Failure Rounding Note  PCP-Cardiologist: No primary care provider on file.   Subjective:   Yesterday lasix drip was increased to 15 mg per hour and given dose of metolazone. Improved diuresis noted.   Remains on milrinone 0.5 mcg. Co-ox 43%  Wants to go home. Denies SOB.    Objective:   Weight Range: 78.5 kg Body mass index is 22.82 kg/m.   Vital Signs:   Temp:  [97.7 F (36.5 C)-98.7 F (37.1 C)] 98.7 F (37.1 C) (11/25 0805) Pulse Rate:  [80-86] 86 (11/25 0805) Resp:  [18-20] 20 (11/25 0805) BP: (89-100)/(58-71) 89/58 (11/25 0805) SpO2:  [96 %-100 %] 98 % (11/25 0805) Weight:  [78.5 kg] 78.5 kg (11/25 0454) Last BM Date: 10/28/19  Weight change: Filed Weights   10/27/19 0534 10/28/19 0500 10/29/19 0454  Weight: 77.9 kg 78.4 kg 78.5 kg    Intake/Output:   Intake/Output Summary (Last 24 hours) at 10/29/2019 0920 Last data filed at 10/29/2019 0809 Gross per 24 hour  Intake 480 ml  Output 2075 ml  Net -1595 ml      Physical Exam   CVP 15 General:  Sitting in the chair. No resp difficulty HEENT: normal Neck: supple. JVp to jaw  Carotids 2+ bilat; no bruits. No lymphadenopathy or thryomegaly appreciated. Cor: PMI nondisplaced. Regular rate & rhythm. No rubs. + S3  3/6 HSM LLSB. R upper chest tunneled catheter.  Lungs: clear Abdomen: soft, nontender, nondistended. No hepatosplenomegaly. No bruits or masses. Good bowel sounds. Extremities: no cyanosis, clubbing, rash, R and LLE 2+ edema Neuro: alert & orientedx3, cranial nerves grossly intact. moves all 4 extremities w/o difficulty. Affect pleasant    Telemetry  SR PVCs NSVT   EKG    N/A  Labs    CBC Recent Labs    10/27/19 0500 10/28/19 0435  WBC 5.3 5.4  HGB 9.1* 9.6*  HCT 29.2* 30.5*  MCV 71.4* 71.3*  PLT 121* 962*   Basic Metabolic Panel Recent Labs    10/27/19 0830  10/28/19 0435  10/29/19 0511  NA  --    < > 133* 134*  K  --    < > 3.5 3.6  CL  --    < > 93* 95*  CO2  --    < > 27 27  GLUCOSE  --    < > 132* 132*  BUN  --    < > 45* 43*  CREATININE  --    < > 2.47* 2.18*  CALCIUM  --    < > 9.0 8.7*  MG 1.6*  --  2.7*  --    < > = values in this interval not displayed.   Liver Function Tests No results for input(s): AST, ALT, ALKPHOS, BILITOT, PROT, ALBUMIN in the last 72 hours. No results for input(s): LIPASE, AMYLASE in the last 72 hours. Cardiac Enzymes No results for input(s): CKTOTAL, CKMB, CKMBINDEX, TROPONINI in the last 72 hours.  BNP: BNP (last 3 results) Recent Labs    02/06/19 1850 08/21/19 2013 10/23/19 1048  BNP 2,172.9* 2,515.0* 3,200.7*    ProBNP (last 3 results) No results for input(s): PROBNP in the last 8760 hours.   D-Dimer No results for input(s): DDIMER in the last 72 hours. Hemoglobin A1C No results for input(s): HGBA1C in the last 72 hours. Fasting Lipid Panel No results for input(s): CHOL, HDL,  LDLCALC, TRIG, CHOLHDL, LDLDIRECT in the last 72 hours. Thyroid Function Tests No results for input(s): TSH, T4TOTAL, T3FREE, THYROIDAB in the last 72 hours.  Invalid input(s): FREET3  Other results:   Imaging    No results found.   Medications:     Scheduled Medications: . amiodarone  200 mg Oral Daily  . apixaban  10 mg Oral BID   Followed by  . [START ON 10/30/2019] apixaban  5 mg Oral BID  . Chlorhexidine Gluconate Cloth  6 each Topical Daily  . mometasone-formoterol  2 puff Inhalation BID  . pantoprazole  40 mg Oral Daily  . sodium chloride flush  10-40 mL Intracatheter Q12H  . sodium chloride flush  3 mL Intravenous Q12H  . sodium chloride flush  3 mL Intravenous Q12H  . tamsulosin  0.4 mg Oral QPC supper    Infusions: . sodium chloride    . furosemide (LASIX) infusion 15 mg/hr (10/28/19 1252)  . milrinone 0.5 mcg/kg/min (10/29/19 0219)    PRN Medications: sodium chloride, acetaminophen **OR**  acetaminophen, antiseptic oral rinse, guaiFENesin-dextromethorphan, HYDROcodone-acetaminophen, ondansetron **OR** ondansetron (ZOFRAN) IV, polyethylene glycol, sodium chloride flush, sodium chloride flush    Assessment/Plan   1. 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 [place] 2015 with moderate disease. Has [employer] BiV ICD. - ECHO 10/22/19 EF severely EF 5-10% with severe MR/TR, biatrial enlargement, RV with moderately reduced function - Admitted with volume overload and recurrent low output HF despite home milrinone at 0.25 mcg.   - Milrinone increased to 0.5 mcg/kg/min.  Co-ox back 43% CVP down to 15 today.  - Stop lasix drip and start torsemide 60 mg twice a day and will give 2.5 mg metolazone twice weekly.    - Continue ted hose as tolerated.   - - SBP soft, probably no room for hydralazine/imdur.  - No bb with low output - No spironolactone, dig, arb with AKI - Follow renal function closely.   2. AKI on CKD Stage IIIb: Creatinine baseline 1.8-2.2 - Creatinine better today, 2.89 => 2.69 => 2.21=>2.1=>2.5=>2.2   3. H/O RP bleed  - was on [facility] due to LV clot but stopped.   4. Acute RLE DVT -Initially on heparin but switched to Eliquis  10 mg bid until 11/25 followed by eliquis 5 mg twice a day.  Hgb followed. .   5. Hypokalemia  K replaced.   6. NSVT On amio 200 mg po daily.  - Would like to keep K 4    7.DNR  Now ok with Hospice - I discussed deactivating ICD. Dr Haroldine Laws discussed with him. He would like to deactivate his ICD. [employer] contacted. Deactivated ICd 11/24//20    He wants to go home with Hospice.  I spoke with Carolynn Sayers. Set up for d/c with milrinone 0. 65mcg for home. She will set up today at 11.   Torsemide 60 mg twice a day  Metolazone 2.5 mg weekly with first dose Thursday/ Sunday Continue Kdur 40 meq twice a day  He has follow up next week with Dr Haroldine Laws.   Length of Stay: [place], NP  10/29/2019, 9:20 AM  Advanced Heart Failure Team Pager [phone] (M-F; 7a - 4p)  Please contact [facility] for night-coverage after hours (4p -7a ) and weekends on [email or web address]   Patient seen and examined with Darrick Grinder, NP. We discussed all aspects of the encounter. I agree with the assessment and plan as  stated above.   He is at the end-of-life. Co-ox remains low on high-dose milrinone. Volume status improved. We have made arrangements for him to go home today with Hospice and palliative milrinone. Continue meds as above. Over 45 mins spent coordinating Hospice arrangements and home inotropes. ICD deactivated.   Glori Bickers, MD  6:16 PM

## 2019-10-29 NOTE — Discharge Summary (Signed)
Physician Discharge Summary  Spencer Rice OIZ:[id number] DOB: 1941/08/26 DOA: 10/23/2019  PCP: [name]., MD  Admit date: 10/23/2019 Discharge date: 10/29/2019  Admitted From: Home Disposition:  Home  Recommendations for Outpatient Follow-up:  1. Follow up with PCP in as scheduled 2. 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 Patient reports that he felt slightly more short of breath than usual for the past couple days, but then worsened significantly overnight last night. His dyspnea persisted through the morning and he went to the ED for evaluation. Reports that his weight has been stable, denies fevers, reports increased leg swelling on the right, but denies any leg tenderness or chest pain. He denies any dietary indiscretion. Diuretic was reduced recently due to worsening renal function.  Discharge Diagnoses:  Principal Problem:   Deep vein thrombosis (DVT) of right lower extremity (HCC) Active Problems:   Chronic combined systolic and diastolic CHF (congestive heart failure) (HCC)   Acute on chronic systolic heart failure (HCC)   Acute renal failure superimposed on stage 3a chronic kidney disease ([place])   Advanced care planning/counseling discussion   Goals of care, counseling/discussion   Xerostomia  1.Right leg DVT -Presents with SOB and increased leg swelling Rt>Lt, and is found to have RLE DVT  - He has hx of LLE DVT and mural thrombus, and had been anticoagulated until  retroperitoneal bleed early this year -Initially started on IV heparin in ED, now on eliquis and tolerating  2.Acute kidney injury superimposed on CKD III -SCr is 3.07 on admission, up from 2.0 in September 2020 -Renal US reviewed, no evidence of obstructive disease - Cr had remained stable  3.Chronic combined systolic & diastolic CHF; NSVT -Presents with increased SOB, denies wt gain, no edema noted on CXR, ?SOB secondary to PE in light of his DVT and clear CXR but he reports improvement after Lasix 80 mg IV in ED -EF was 5-10% on 10/22/19 and he is dependent on milrinone infusion -He has NSVT, on amiodarone, ICD in place -Cardiology following -Planning for home with hospice today. Deactivate ICD per Cardiology. Case manager and hopsice following  4.COPD -No cough or wheezing on admission -Continue Dulera as tolerated - remains on minimal O2 support   Discharge Instructions  Discharge Instructions    (HEART FAILURE PATIENTS) Call MD:  Anytime you have any of the following symptoms: 1) 3 pound weight gain in 24 hours or 5 pounds in 1 week 2) shortness of breath, with or without a dry hacking cough 3) swelling in the hands, feet or stomach 4) if you have to sleep on extra pillows at night in order to breathe.   Complete by: As directed    Diet - low sodium heart healthy   Complete by: As directed    Heart Failure patients record your daily weight using the same scale at the same time of day   Complete by: As directed    Increase activity slowly   Complete by: As directed      Allergies as of 10/29/2019   No Known Allergies     Medication  List    STOP taking these medications   ciprofloxacin 500 MG tablet Commonly known as: CIPRO   spironolactone 25 MG tablet Commonly known as: ALDACTONE     TAKE these medications   acetaminophen 325 MG tablet Commonly known as: TYLENOL Take 2 tablets (650 mg total) by mouth every 4 (four) hours as needed for  headache or mild pain.   amiodarone 200 MG tablet Commonly known as: PACERONE TAKE 1 TABLET BY MOUTH EVERY DAY   apixaban 5 MG Tabs tablet Commonly known as: ELIQUIS Take 1 tablet (5 mg total) by mouth 2 (two) times daily. Start taking on: October 30, 2019   docusate sodium 100 MG capsule Commonly known as: COLACE Take 100 mg by mouth every other day.   lactobacillus Pack Take 1 packet (1 g total) by mouth 3 (three) times daily with meals.   metolazone 2.5 MG tablet Commonly known as: ZAROXOLYN Take 1 tablet (2.5 mg total) by mouth 2 (two) times a week. Every Thursay and Sunday Start taking on: October 30, 2019 What changed:   when to take this  additional instructions   milrinone 20 MG/100 ML Soln infusion Commonly known as: PRIMACOR Inject 0.0388 mg/min into the vein continuous. PER [facility] What changed:   how much to take  additional instructions  how fast to infuse this   mometasone-formoterol 200-5 MCG/ACT Aero Commonly known as: Dulera Inhale 2 puffs into the lungs 2 (two) times daily.   OXYGEN Inhale 2 L into the lungs continuous.   pantoprazole 40 MG tablet Commonly known as: PROTONIX Take 1 tablet (40 mg total) by mouth daily.   polyethylene glycol 17 g packet Commonly known as: MIRALAX / GLYCOLAX Take 17 g by mouth daily.   potassium chloride SA 20 MEQ tablet Commonly known as: Klor-Con M20 Take 2 tablets (40 mEq total) by mouth 2 (two) times daily. What changed: when to take this   silodosin 8 MG Caps capsule Commonly known as: RAPAFLO Take 8 mg by mouth daily.   sucralfate 1 g tablet Commonly known as: Carafate Take 1 tablet (1 g total) by mouth 4 (four) times daily -  with meals and at bedtime. What changed:   how much to take  when to take this   tamsulosin 0.4 MG Caps capsule Commonly known as: FLOMAX Take 2 capsules (0.8 mg total) by mouth daily after supper.   torsemide 20 MG tablet Commonly known as: DEMADEX Take 3  tablets (60 mg total) by mouth 2 (two) times daily. What changed: how much to take            Durable Medical Equipment  (From admission, onward)         Start     Ordered   10/28/19 1433  Heart failure home health orders  (Heart failure home health orders / Face to face)  Once    Comments: Heart Failure Follow-up Care:  Verify follow-up appointments per Patient Discharge Instructions. Confirm transportation arranged. Reconcile home medications with discharge medication list. Remove discontinued medications from use. Assist patient/caregiver to manage medications using pill box. Reinforce low sodium food selection Assessments: Vital signs and oxygen saturation at each visit. Assess home environment for safety concerns, caregiver support and availability of low-sodium foods. Consult Education officer, museum, PT/OT, Dietitian, and CNA based on assessments. Perform comprehensive cardiopulmonary assessment. Notify MD for any change in condition or weight gain of 3 pounds in one day or 5 pounds in one week with symptoms. Daily Weights  and Symptom Monitoring: Ensure patient has access to scales. Teach patient/caregiver to weigh daily before breakfast and after voiding using same scale and record.    Teach patient/caregiver to track weight and symptoms and when to notify Provider. Activity: Develop individualized activity plan with patient/caregiver.   Home Paraenteral Inotropic Therapy : Data Collection Form  Patients name: Spencer Rice   Date: 10/28/19  Information below may not be completed by the supplier nor anyone in a Financial relationship with the supplier.  1. Results of invasive hemodynamic monitoring  Cardiac Index Before Inotrope infusion:           1.3        On Inotrope infusion:            1.7           Drug and dose:   Milrinone 0.5 mcg   2. Cardiac medications immediately prior to inotrope infusion (List name, dose, and frequency)  N/A Milrinone dependent  3. Dose  this represent maximum tolerated doses of these medications? Yes.   4. Breathing status Prior to inotrope infusion: Dyspnea at rest  At time of discharge: Dyspnea on moderate exertion.   5. Initial home prescription Drug and Dose:   Milrinone 0.5 mcg  for continuous infusion 24/hr day and 7 days/week  6. If continuous infusion is prescribed, have attempts to discontinue inotrope infusion in the hospital failed?   Yes.  7. If intermittent infusion is prescribed, have there been repeated hospitalizations for heart failure which Parenteral inotrope were required? Not applicable.   8. Is patient capable of going to the physician for outpatient evaluation? Yes.   9. Is routine electrocardiographic monitoring required in the Home?  No.   The above statements and any additional explanations included separately are true and accurate and there is documentation present in the patients medical record to support these statements.   Completed by Darrick Grinder, NP   In instances where this form was completed by an Advanced Practice Provider, please see EMR for physician Co-Signature.   [facility] to provide  Labs every other week to include BMET, Mg, and CBC with Diff. Additional as needed. Should be drawn via PERIPHERAL stick. NOT PICC line.   X9147 Milrinone 0.25  mcg/kg/min X 52 weeks  A4221 Supplies for maintenance of drug infusion catheter A4222 Supplies for the external drug infusion per cassette or bag [id number] Ambulatory Infusion pump  Question Answer Comment  Heart Failure Follow-up Care [facility] at [phone]   Obtain the following labs Other see comments   Lab frequency Weekly   Fax lab results to [facility] Clinic at [phone]   Diet Low Sodium Heart Healthy   Fluid restrictions: 1800 mL Fluid   Initiate Heart Failure Clinic Diuretic Protocol to be used by [facility] only ( to be ordered by Heart Failure Team Providers Only) No      10/28/19 1437          Follow-up Information    [facility] Follow up.   Why: Home Hospice Contact information: [phone]       [facility] Follow up on 11/06/2019.   Specialty: Cardiology Why: at 2:20 Virtual Visit with Dr Casper Harrison information: [address] [id number] [place] [phone]         No Known Allergies  Consultations:  Cardiology  Palliative Care  Procedures/Studies: US Renal  Result Date: 10/24/2019 CLINICAL  DATA:  Acute renal failure superimposed on stage 3 chronic kidney disease. 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 EXAM: RENAL / URINARY TRACT ULTRASOUND COMPLETE COMPARISON:  Noncontrast abdominal CT 09/08/2019 FINDINGS: Right Kidney: Renal measurements: 9.6 x 4.2 x 4.7 cm = volume: 100 mL. Cyst in the central upper kidney on prior CT is not well demonstrated sonographically. Mild increased renal echogenicity. No hydronephrosis. No solid lesion visualized. Left Kidney: Renal measurements: 10 x 5.2 x 4.5 cm = volume: 122 mL. Simple cyst in the lower kidney measuring 2.3 cm. The ablation defect is not well visualized sonographically. No hydronephrosis. Bladder: Partially distended.  Wall thickening of 1 cm. Other: None. IMPRESSION: 1. No hydronephrosis or obstructive uropathy. 2. Left renal cyst. Ablation site left kidney on prior CT is not well demonstrated sonographically. 3. Bladder wall thickening may be due to nondistention. Electronically Signed   By: Keith Rake M.D.   On: 10/24/2019 05:17   US Venous Img Lower Right (dvt Study)  Result Date: 10/23/2019 CLINICAL DATA:  Leg swelling EXAM: RIGHT LOWER EXTREMITY VENOUS DOPPLER ULTRASOUND TECHNIQUE: Gray-scale sonography with compression, as well as color and duplex ultrasound, were performed to evaluate the deep venous system from the level of the common femoral vein through the popliteal  and proximal calf veins. COMPARISON:  None FINDINGS: Echogenic occlusive thrombus in the popliteal vein, extending into the peroneal and posterior tibial veins. Normal compressibility of the common femoral and superficial femoral veins . Visualized segments of the saphenous venous system normal in caliber and compressibility. Survey views of the contralateral common femoral vein are unremarkable. IMPRESSION: 1. POSITIVE for occlusive DVT in the right posterior tibial, peroneal, and popliteal veins. Electronically Signed   By: Lucrezia Europe M.D.   On: 10/23/2019 13:20   Dg Chest Port 1 View  Result Date: 10/23/2019 CLINICAL DATA:  Shortness of breath and dry cough. EXAM: PORTABLE CHEST 1 VIEW COMPARISON:  09/25/2019 FINDINGS: Cardiomegaly. Biventricular pacer/ICD from the left. Central line on the right with tip at the [facility]. There is no edema, consolidation, effusion, or pneumothorax. IMPRESSION: No evidence of active disease. Cardiomegaly. Electronically Signed   By: Monte Fantasia M.D.   On: 10/23/2019 11:11   Acute Abd Series  Result Date: 10/11/2019 CLINICAL DATA:  Constipation and shortness of breath EXAM: DG ABDOMEN ACUTE W/ 1V CHEST COMPARISON:  He September 25, 2019. FINDINGS: There is no evidence of dilated bowel loops or free intraperitoneal air. No radiopaque calculi or other significant radiographic abnormality is seen. There is unchanged cardiomegaly. A left-sided pacemaker seen with the lead tips in the right atrium right ventricle. Both lungs are clear. Calcifications seen within the left upper quadrant. IMPRESSION: Negative abdominal radiographs.  No acute cardiopulmonary disease. Electronically Signed   By: Prudencio Pair M.D.   On: 10/11/2019 02:50     Subjective: Eager to go home  Discharge Exam: Vitals:   10/29/19 0715 10/29/19 0805  BP:  (!) 89/58  Pulse:  86  Resp:  20  Temp:  98.7 F (37.1 C)  SpO2: 98% 98%   Vitals:   10/29/19 0042 10/29/19 0454 10/29/19 0715 10/29/19 0805   BP: 100/69 96/71  (!) 89/58  Pulse: 86 85  86  Resp: 20 20  20   Temp: 98.7 F (37.1 C) 98.3 F (36.8 C)  98.7 F (37.1 C)  TempSrc: Oral Oral  Oral  SpO2: 100% 100% 98% 98%  Weight:  78.5 kg    Height:  General: Pt is alert, awake, not in acute distress Cardiovascular: RRR, S1/S2 +, no rubs, no gallops Respiratory: CTA bilaterally, no wheezing, no rhonchi Abdominal: Soft, NT, ND, bowel sounds + Extremities: no edema, no cyanosis   The results of significant diagnostics from this hospitalization (including imaging, microbiology, ancillary and laboratory) are listed below for reference.     Microbiology: Recent Results (from the past 240 hour(s))  SARS CORONAVIRUS 2 (TAT 6-24 HRS) Nasopharyngeal Nasopharyngeal Swab     Status: None   Collection Time: 10/23/19  2:06 PM   Specimen: Nasopharyngeal Swab  Result Value Ref Range Status   SARS Coronavirus 2 NEGATIVE NEGATIVE Final    Comment: (NOTE) SARS-CoV-2 target nucleic acids are NOT DETECTED. The SARS-CoV-2 RNA is generally detectable in upper and lower respiratory specimens during the acute phase of infection. Negative results do not preclude SARS-CoV-2 infection, do not rule out co-infections with other pathogens, and should not be used as the sole basis for treatment or other patient management decisions. Negative results must be combined with clinical observations, patient history, and epidemiological information. The expected result is Negative. Fact Sheet for Patients: SugarRoll.be Fact Sheet for Healthcare Providers: https://www.woods-mathews.com/ This test is not yet approved or cleared by the [place] FDA and  has been authorized for detection and/or diagnosis of SARS-CoV-2 by FDA under an Emergency Use Authorization (EUA). This EUA will remain  in effect (meaning this test can be used) for the duration of the COVID-19 declaration under Section 56 4(b)(1) of  the Act, 21 U.S.C. section 360bbb-3(b)(1), unless the authorization is terminated or revoked sooner. Performed at [facility], [place] [address], [place], [place] [address]      Labs: BNP (last 3 results) Recent Labs    02/06/19 1850 08/21/19 2013 10/23/19 1048  BNP 2,172.9* 2,515.0* 1,245.8*   Basic Metabolic Panel: Recent Labs  Lab 10/24/19 0420 10/25/19 0517 10/26/19 0532 10/27/19 0500 10/27/19 0830 10/27/19 1422 10/28/19 0435 10/29/19 0511  NA 138 140 137 132*  --  130* 133* 134*  K 4.1 3.1* 3.7 2.9*  --  3.6 3.5 3.6  CL 99 101 100 95*  --  91* 93* 95*  CO2 25 27 26 29   --  28 27 27   GLUCOSE 110* 159* 127* 112*  --  179* 132* 132*  BUN 67* 54* 44* 43*  --  42* 45* 43*  CREATININE 2.89* 2.69* 2.21* 2.08*  --  2.19* 2.47* 2.18*  CALCIUM 9.2 8.9 8.9 8.5*  --  8.7* 9.0 8.7*  MG 2.5* 2.5*  --   --  1.6*  --  2.7*  --    Liver Function Tests: Recent Labs  Lab 10/23/19 1048 10/24/19 0420 10/25/19 0517  AST 45* 33 32  ALT 21 18 19   ALKPHOS 110 103 100  BILITOT 2.7* 2.4* 2.1*  PROT 7.7 6.5 6.3*  ALBUMIN 3.7 3.1* 2.9*   No results for input(s): LIPASE, AMYLASE in the last 168 hours. No results for input(s): AMMONIA in the last 168 hours. CBC: Recent Labs  Lab 10/23/19 1048 10/24/19 0420 10/25/19 0517 10/26/19 0532 10/27/19 0500 10/28/19 0435  WBC 6.5 7.4 6.3 6.1 5.3 5.4  NEUTROABS 4.8  --   --   --   --   --   HGB 11.1* 10.0* 9.5* 10.1* 9.1* 9.6*  HCT 36.5* 32.2* 30.6* 32.1* 29.2* 30.5*  MCV 73.4* 71.9* 71.5* 71.5* 71.4* 71.3*  PLT 140* 120* 120* 128* 121* 118*   Cardiac Enzymes: No  results for input(s): CKTOTAL, CKMB, CKMBINDEX, TROPONINI in the last 168 hours. BNP: Invalid input(s): POCBNP CBG: Recent Labs  Lab 10/26/19 0634 10/29/19 0801  GLUCAP 121* 194*   D-Dimer No results for input(s): DDIMER in the last 72 hours. Hgb A1c No results for input(s): HGBA1C in the last 72 hours. Lipid Profile No results for input(s): CHOL, HDL,  LDLCALC, TRIG, CHOLHDL, LDLDIRECT in the last 72 hours. Thyroid function studies No results for input(s): TSH, T4TOTAL, T3FREE, THYROIDAB in the last 72 hours.  Invalid input(s): FREET3 Anemia work up No results for input(s): VITAMINB12, FOLATE, FERRITIN, TIBC, IRON, RETICCTPCT in the last 72 hours. Urinalysis    Component Value Date/Time   COLORURINE YELLOW 10/11/2019 0142   APPEARANCEUR CLEAR 10/11/2019 0142   LABSPEC 1.010 10/11/2019 0142   PHURINE 6.0 10/11/2019 0142   GLUCOSEU NEGATIVE 10/11/2019 0142   HGBUR TRACE (A) 10/11/2019 0142   BILIRUBINUR NEGATIVE 10/11/2019 0142   KETONESUR NEGATIVE 10/11/2019 0142   PROTEINUR NEGATIVE 10/11/2019 0142   NITRITE NEGATIVE 10/11/2019 0142   LEUKOCYTESUR NEGATIVE 10/11/2019 0142   Sepsis Labs Invalid input(s): PROCALCITONIN,  WBC,  LACTICIDVEN Microbiology Recent Results (from the past 240 hour(s))  SARS CORONAVIRUS 2 (TAT 6-24 HRS) Nasopharyngeal Nasopharyngeal Swab     Status: None   Collection Time: 10/23/19  2:06 PM   Specimen: Nasopharyngeal Swab  Result Value Ref Range Status   SARS Coronavirus 2 NEGATIVE NEGATIVE Final    Comment: (NOTE) SARS-CoV-2 target nucleic acids are NOT DETECTED. The SARS-CoV-2 RNA is generally detectable in upper and lower respiratory specimens during the acute phase of infection. Negative results do not preclude SARS-CoV-2 infection, do not rule out co-infections with other pathogens, and should not be used as the sole basis for treatment or other patient management decisions. Negative results must be combined with clinical observations, patient history, and epidemiological information. The expected result is Negative. Fact Sheet for Patients: SugarRoll.be Fact Sheet for Healthcare Providers: https://www.woods-mathews.com/ This test is not yet approved or cleared by the [place] FDA and  has been authorized for detection and/or diagnosis of  SARS-CoV-2 by FDA under an Emergency Use Authorization (EUA). This EUA will remain  in effect (meaning this test can be used) for the duration of the COVID-19 declaration under Section 56 4(b)(1) of the Act, 21 U.S.C. section 360bbb-3(b)(1), unless the authorization is terminated or revoked sooner. Performed at [facility], [place] [address], [place], [place] [address]    Time spent: 30 min  SIGNED:   Marylu Lund, MD  [facility] 10/29/2019, 1:03 PM  If 7PM-7AM, please contact night-coverage

## 2019-10-29 NOTE — Progress Notes (Signed)
   10/29/19 0524  MEWS Assessment  Is this an acute change? No  Vital signs transferred to wrong patient.  Correct VS gives this pt a green mews

## 2019-11-05 ENCOUNTER — Telehealth (HOSPITAL_COMMUNITY): Payer: Self-pay

## 2019-11-05 NOTE — Telephone Encounter (Signed)
Pt left message wanting to cancel appt for tomorrow. He reports that he does not have any HF symptoms however having issues with his stomach and he does not feel well.  He feels a burning sensation and when he belches he gets some relief.  Advised him to go to ED if pain severe and/or call primary MD to see if he can be evaluated. He reminded me that it was not his heart he was calling about and he wanted to cancel appt.  Appt canceled for tomorrow.  He had a previously arranged virtual appt for next week.

## 2019-11-06 ENCOUNTER — Telehealth (HOSPITAL_COMMUNITY): Payer: Medicare Other | Admitting: Internal Medicine

## 2019-11-12 ENCOUNTER — Telehealth (HOSPITAL_COMMUNITY): Payer: Medicare Other | Admitting: Internal Medicine

## 2019-11-20 ENCOUNTER — Other Ambulatory Visit (HOSPITAL_COMMUNITY): Payer: Self-pay | Admitting: Internal Medicine

## 2019-12-06 ENCOUNTER — Other Ambulatory Visit (HOSPITAL_COMMUNITY): Payer: Self-pay | Admitting: Internal Medicine

## 2019-12-21 ENCOUNTER — Other Ambulatory Visit (HOSPITAL_COMMUNITY): Payer: Self-pay | Admitting: Internal Medicine

## 2019-12-24 ENCOUNTER — Emergency Department (HOSPITAL_COMMUNITY)

## 2019-12-24 ENCOUNTER — Inpatient Hospital Stay (HOSPITAL_COMMUNITY)
Admission: EM | Admit: 2019-12-24 | Discharge: 2020-01-05 | DRG: 291 | Disposition: A | Discharge status: Hospice | Attending: Internal Medicine | Admitting: Internal Medicine

## 2019-12-24 ENCOUNTER — Encounter (HOSPITAL_COMMUNITY): Payer: Self-pay | Admitting: Emergency Medicine

## 2019-12-24 DIAGNOSIS — Z8249 Family history of ischemic heart disease and other diseases of the circulatory system: Secondary | ICD-10-CM

## 2019-12-24 DIAGNOSIS — E1122 Type 2 diabetes mellitus with diabetic chronic kidney disease: Secondary | ICD-10-CM | POA: Diagnosis present

## 2019-12-24 DIAGNOSIS — I130 Hypertensive heart and chronic kidney disease with heart failure and stage 1 through stage 4 chronic kidney disease, or unspecified chronic kidney disease: Secondary | ICD-10-CM | POA: Diagnosis not present

## 2019-12-24 DIAGNOSIS — E785 Hyperlipidemia, unspecified: Secondary | ICD-10-CM | POA: Diagnosis present

## 2019-12-24 DIAGNOSIS — Z85528 Personal history of other malignant neoplasm of kidney: Secondary | ICD-10-CM | POA: Diagnosis not present

## 2019-12-24 DIAGNOSIS — Z86718 Personal history of other venous thrombosis and embolism: Secondary | ICD-10-CM

## 2019-12-24 DIAGNOSIS — J449 Chronic obstructive pulmonary disease, unspecified: Secondary | ICD-10-CM | POA: Diagnosis present

## 2019-12-24 DIAGNOSIS — I447 Left bundle-branch block, unspecified: Secondary | ICD-10-CM | POA: Diagnosis present

## 2019-12-24 DIAGNOSIS — N1832 Chronic kidney disease, stage 3b: Secondary | ICD-10-CM | POA: Diagnosis present

## 2019-12-24 DIAGNOSIS — Z20822 Contact with and (suspected) exposure to covid-19: Secondary | ICD-10-CM | POA: Diagnosis present

## 2019-12-24 DIAGNOSIS — Z9981 Dependence on supplemental oxygen: Secondary | ICD-10-CM

## 2019-12-24 DIAGNOSIS — K449 Diaphragmatic hernia without obstruction or gangrene: Secondary | ICD-10-CM | POA: Diagnosis present

## 2019-12-24 DIAGNOSIS — I509 Heart failure, unspecified: Secondary | ICD-10-CM | POA: Diagnosis not present

## 2019-12-24 DIAGNOSIS — R609 Edema, unspecified: Secondary | ICD-10-CM

## 2019-12-24 DIAGNOSIS — I428 Other cardiomyopathies: Secondary | ICD-10-CM | POA: Diagnosis present

## 2019-12-24 DIAGNOSIS — Z87891 Personal history of nicotine dependence: Secondary | ICD-10-CM

## 2019-12-24 DIAGNOSIS — M1990 Unspecified osteoarthritis, unspecified site: Secondary | ICD-10-CM | POA: Diagnosis present

## 2019-12-24 DIAGNOSIS — Z95810 Presence of automatic (implantable) cardiac defibrillator: Secondary | ICD-10-CM

## 2019-12-24 DIAGNOSIS — R0602 Shortness of breath: Secondary | ICD-10-CM | POA: Diagnosis present

## 2019-12-24 DIAGNOSIS — Z515 Encounter for palliative care: Secondary | ICD-10-CM | POA: Diagnosis not present

## 2019-12-24 DIAGNOSIS — K219 Gastro-esophageal reflux disease without esophagitis: Secondary | ICD-10-CM | POA: Diagnosis present

## 2019-12-24 DIAGNOSIS — I5082 Biventricular heart failure: Secondary | ICD-10-CM | POA: Diagnosis present

## 2019-12-24 DIAGNOSIS — I5023 Acute on chronic systolic (congestive) heart failure: Secondary | ICD-10-CM | POA: Diagnosis present

## 2019-12-24 DIAGNOSIS — Z7951 Long term (current) use of inhaled steroids: Secondary | ICD-10-CM

## 2019-12-24 DIAGNOSIS — Z66 Do not resuscitate: Secondary | ICD-10-CM | POA: Diagnosis present

## 2019-12-24 DIAGNOSIS — Z7189 Other specified counseling: Secondary | ICD-10-CM | POA: Diagnosis not present

## 2019-12-24 DIAGNOSIS — I2510 Atherosclerotic heart disease of native coronary artery without angina pectoris: Secondary | ICD-10-CM | POA: Diagnosis present

## 2019-12-24 DIAGNOSIS — E871 Hypo-osmolality and hyponatremia: Secondary | ICD-10-CM | POA: Diagnosis not present

## 2019-12-24 DIAGNOSIS — E876 Hypokalemia: Secondary | ICD-10-CM | POA: Diagnosis not present

## 2019-12-24 DIAGNOSIS — I5084 End stage heart failure: Secondary | ICD-10-CM | POA: Diagnosis present

## 2019-12-24 DIAGNOSIS — R339 Retention of urine, unspecified: Secondary | ICD-10-CM | POA: Diagnosis present

## 2019-12-24 DIAGNOSIS — K5900 Constipation, unspecified: Secondary | ICD-10-CM | POA: Diagnosis present

## 2019-12-24 DIAGNOSIS — Z7901 Long term (current) use of anticoagulants: Secondary | ICD-10-CM

## 2019-12-24 DIAGNOSIS — I5042 Chronic combined systolic (congestive) and diastolic (congestive) heart failure: Secondary | ICD-10-CM

## 2019-12-24 LAB — COOXEMETRY PANEL
Carboxyhemoglobin: 2.1 % — ABNORMAL HIGH (ref 0.5–1.5)
Methemoglobin: 0.9 % (ref 0.0–1.5)
O2 Saturation: 61.5 %
Total hemoglobin: 9.9 g/dL — ABNORMAL LOW (ref 12.0–16.0)

## 2019-12-24 LAB — COMPREHENSIVE METABOLIC PANEL
ALT: 17 U/L (ref 0–44)
AST: 38 U/L (ref 15–41)
Albumin: 2.8 g/dL — ABNORMAL LOW (ref 3.5–5.0)
Alkaline Phosphatase: 104 U/L (ref 38–126)
Anion gap: 13 (ref 5–15)
BUN: 47 mg/dL — ABNORMAL HIGH (ref 8–23)
CO2: 23 mmol/L (ref 22–32)
Calcium: 9.2 mg/dL (ref 8.9–10.3)
Chloride: 96 mmol/L — ABNORMAL LOW (ref 98–111)
Creatinine, Ser: 2.4 mg/dL — ABNORMAL HIGH (ref 0.61–1.24)
GFR calc Af Amer: 29 mL/min — ABNORMAL LOW (ref >60)
GFR calc non Af Amer: 25 mL/min — ABNORMAL LOW (ref >60)
Glucose, Bld: 90 mg/dL (ref 70–99)
Potassium: 4.4 mmol/L (ref 3.5–5.1)
Sodium: 132 mmol/L — ABNORMAL LOW (ref 135–145)
Total Bilirubin: 3.8 mg/dL — ABNORMAL HIGH (ref 0.3–1.2)
Total Protein: 7.1 g/dL (ref 6.5–8.1)

## 2019-12-24 LAB — CBC WITH DIFFERENTIAL/PLATELET
Abs Immature Granulocytes: 0.01 10*3/uL (ref 0.00–0.07)
Basophils Absolute: 0 10*3/uL (ref 0.0–0.1)
Basophils Relative: 0 %
Eosinophils Absolute: 0.1 10*3/uL (ref 0.0–0.5)
Eosinophils Relative: 2 %
HCT: 31.7 % — ABNORMAL LOW (ref 39.0–52.0)
Hemoglobin: 9.7 g/dL — ABNORMAL LOW (ref 13.0–17.0)
Immature Granulocytes: 0 %
Lymphocytes Relative: 11 %
Lymphs Abs: 0.5 10*3/uL — ABNORMAL LOW (ref 0.7–4.0)
MCH: 19.9 pg — ABNORMAL LOW (ref 26.0–34.0)
MCHC: 30.6 g/dL (ref 30.0–36.0)
MCV: 65 fL — ABNORMAL LOW (ref 80.0–100.0)
Monocytes Absolute: 0.5 10*3/uL (ref 0.1–1.0)
Monocytes Relative: 11 %
Neutro Abs: 3.4 10*3/uL (ref 1.7–7.7)
Neutrophils Relative %: 76 %
Platelets: 155 10*3/uL (ref 150–400)
RBC: 4.88 MIL/uL (ref 4.22–5.81)
RDW: 23.5 % — ABNORMAL HIGH (ref 11.5–15.5)
WBC: 4.5 10*3/uL (ref 4.0–10.5)
nRBC: 0.9 % — ABNORMAL HIGH (ref 0.0–0.2)

## 2019-12-24 LAB — POC SARS CORONAVIRUS 2 AG -  ED: SARS Coronavirus 2 Ag: NEGATIVE

## 2019-12-24 LAB — BRAIN NATRIURETIC PEPTIDE: B Natriuretic Peptide: 2727.7 pg/mL — ABNORMAL HIGH (ref 0.0–100.0)

## 2019-12-24 MED ORDER — SODIUM CHLORIDE 0.9% FLUSH
10.0000 mL | Freq: Two times a day (BID) | INTRAVENOUS | Status: DC
  Administered 2019-12-25 – 2020-01-03 (×14): 10 mL

## 2019-12-24 MED ORDER — FUROSEMIDE 10 MG/ML IJ SOLN
20.0000 mg/h | INTRAVENOUS | Status: DC
  Administered 2019-12-24: 10 mg/h via INTRAVENOUS
  Administered 2019-12-25 – 2019-12-29 (×10): 20 mg/h via INTRAVENOUS
  Filled 2019-12-24 (×12): qty 25
  Filled 2019-12-24: qty 21
  Filled 2019-12-24 (×2): qty 25

## 2019-12-24 MED ORDER — POTASSIUM CHLORIDE CRYS ER 20 MEQ PO TBCR
40.0000 meq | EXTENDED_RELEASE_TABLET | Freq: Every day | ORAL | Status: DC
  Administered 2019-12-24: 40 meq via ORAL
  Filled 2019-12-24: qty 2

## 2019-12-24 MED ORDER — DOCUSATE SODIUM 100 MG PO CAPS
100.0000 mg | ORAL_CAPSULE | ORAL | Status: DC
  Administered 2019-12-24 – 2019-12-26 (×2): 100 mg via ORAL
  Filled 2019-12-24 (×2): qty 1

## 2019-12-24 MED ORDER — SODIUM CHLORIDE 0.9% FLUSH
10.0000 mL | INTRAVENOUS | Status: DC | PRN
  Administered 2019-12-27: 10 mL

## 2019-12-24 MED ORDER — FUROSEMIDE 10 MG/ML IJ SOLN
80.0000 mg | Freq: Once | INTRAMUSCULAR | Status: AC
  Administered 2019-12-24: 80 mg via INTRAVENOUS
  Filled 2019-12-24: qty 8

## 2019-12-24 MED ORDER — LACTULOSE 10 GM/15ML PO SOLN
30.0000 g | Freq: Once | ORAL | Status: AC
  Administered 2019-12-24: 30 g via ORAL
  Filled 2019-12-24: qty 45

## 2019-12-24 MED ORDER — POLYETHYLENE GLYCOL 3350 17 G PO PACK: 17.0000 g | PACK | Freq: Every day | ORAL | Status: DC

## 2019-12-24 MED ORDER — TAMSULOSIN HCL 0.4 MG PO CAPS
0.4000 mg | ORAL_CAPSULE | Freq: Every day | ORAL | Status: DC
  Administered 2019-12-24 – 2020-01-04 (×12): 0.4 mg via ORAL
  Filled 2019-12-24 (×12): qty 1

## 2019-12-24 MED ORDER — MILRINONE LACTATE IN DEXTROSE 20-5 MG/100ML-% IV SOLN
0.5000 ug/kg/min | INTRAVENOUS | Status: DC
  Administered 2019-12-24 – 2019-12-25 (×3): 0.5 ug/kg/min via INTRAVENOUS
  Filled 2019-12-24 (×4): qty 100

## 2019-12-24 MED ORDER — SUCRALFATE 1 G PO TABS
1.0000 g | ORAL_TABLET | Freq: Three times a day (TID) | ORAL | Status: DC
  Administered 2019-12-24 – 2020-01-05 (×47): 1 g via ORAL
  Filled 2019-12-24 (×45): qty 1

## 2019-12-24 MED ORDER — APIXABAN 5 MG PO TABS
5.0000 mg | ORAL_TABLET | Freq: Two times a day (BID) | ORAL | Status: DC
  Administered 2019-12-24 – 2020-01-05 (×24): 5 mg via ORAL
  Filled 2019-12-24 (×25): qty 1

## 2019-12-24 MED ORDER — CHLORHEXIDINE GLUCONATE CLOTH 2 % EX PADS
6.0000 | MEDICATED_PAD | Freq: Every day | CUTANEOUS | Status: DC
  Administered 2019-12-25 – 2020-01-05 (×6): 6 via TOPICAL

## 2019-12-24 MED ORDER — ACETAMINOPHEN 325 MG PO TABS
650.0000 mg | ORAL_TABLET | ORAL | Status: DC | PRN
  Administered 2019-12-27 (×2): 650 mg via ORAL
  Filled 2019-12-24 (×2): qty 2

## 2019-12-24 MED ORDER — POLYETHYLENE GLYCOL 3350 17 G PO PACK
17.0000 g | PACK | Freq: Every day | ORAL | Status: DC
  Administered 2019-12-24 – 2020-01-02 (×10): 17 g via ORAL
  Filled 2019-12-24 (×12): qty 1

## 2019-12-24 MED ORDER — FLORANEX PO PACK
1.0000 g | PACK | Freq: Three times a day (TID) | ORAL | Status: DC
  Administered 2019-12-25 – 2020-01-05 (×33): 1 g via ORAL
  Filled 2019-12-24 (×37): qty 1

## 2019-12-24 MED ORDER — PANTOPRAZOLE SODIUM 40 MG PO TBEC
40.0000 mg | DELAYED_RELEASE_TABLET | Freq: Every day | ORAL | Status: DC
  Administered 2019-12-24 – 2020-01-05 (×12): 40 mg via ORAL
  Filled 2019-12-24 (×12): qty 1

## 2019-12-24 MED ORDER — AMIODARONE HCL 200 MG PO TABS
200.0000 mg | ORAL_TABLET | Freq: Every day | ORAL | Status: DC
  Administered 2019-12-24 – 2020-01-05 (×13): 200 mg via ORAL
  Filled 2019-12-24 (×13): qty 1

## 2019-12-24 NOTE — ED Provider Notes (Signed)
[facility] EMERGENCY DEPARTMENT Provider Note   CSN: [id number] Arrival date & time: 12/24/19  1250     History No chief complaint on file.   Spencer Rice is a 79 y.o. male.  79 year old male with extensive past medical history below including CHF on milrinone, COPD, CKD, AICD who presents with scrotal swelling.  Patient states that over the past few days he has had progressively worsening swelling in his scrotum.  He denies any problems with urination.  He has leg edema but he does not feel it is worse than usual.  His chronic shortness of breath is variable, sometimes he feels short of breath and sometimes he feels better.  He has been stable on his home O2 of 2 L.  He reports cough for the past few weeks.  No associated fevers or vomiting.  He has been taking all of his medications.  The history is provided by the patient.       Past Medical History:  Diagnosis Date  . AICD (automatic cardioverter/defibrillator) present   . 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 CHF (congestive heart failure) ([place])   . CKD (chronic kidney disease)   . 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 Coronary artery disease   . DVT (deep venous thrombosis) (H[facility]) 1983   LLE  . GERD (gastroesophageal reflux disease)   . History of hiatal hernia   . Hypertension     Patient Active Problem List   Diagnosis Date Noted  . Advanced care planning/counseling discussion   . Goals of care, counseling/discussion   . Xerostomia   . Acute on chronic systolic heart failure ([place]) 10/23/2019  . Deep vein thrombosis (DVT) of right lower extremity ([place]) 10/23/2019  . Acute renal failure superimposed on stage 3a chronic kidney disease ([place]) 10/23/2019  . Chronic combined systolic and diastolic CHF (congestive heart failure) ([place]) 02/08/2019  . Cardiogenic shock ([place]) 02/01/2019  . Volume overload 02/01/2019  . Retroperitoneal bleed 02/01/2019  .  Urinary tract infection due to Klebsiella species 02/01/2019  . Weakness generalized   . Tachypnea   . Sinus tachycardia   . Hyponatremia   . Acute blood loss anemia   . DNR (do not resuscitate) discussion   . Palliative care by specialist   . Malnutrition of moderate degree 01/06/2019  . CKD (chronic kidney disease) stage 3, GFR 30-59 ml/min 12/30/2018  . PVCs (premature ventricular contractions) 12/30/2018  . Acute on chronic systolic congestive heart failure ([place]) 12/29/2018  . CHF (congestive heart failure) ([place]) 05/03/2018  . Acute on chronic systolic (congestive) heart failure ([place]) 05/03/2018  . Glucose intolerance (impaired glucose tolerance) 06/23/2016  . Vitreous floaters 12/08/2015  . TIA (transient ischemic attack) 12/08/2015  . SOB (shortness of breath) 12/08/2015  . Posterior vitreous detachment of both eyes 12/08/2015  . Pain due to dental caries 12/08/2015  . Other abnormal glucose 12/08/2015  . Osteoarthrosis 12/08/2015  . Long term (current) use of anticoagulants 12/08/2015  . Kidney mass 12/08/2015  . Hypersomnolence 12/08/2015  . Hyperopia with presbyopia of both eyes 12/08/2015  . Hyperlipidemia 12/08/2015  . History of orthopnea 12/08/2015  . COPD (chronic obstructive pulmonary disease) ([place]) 12/08/2015  . 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 AKI (acute kidney injury) ([place]) 12/08/2015  . Abnormal CT scan, kidney 12/08/2015  . Abdominal bloating 12/08/2015  . Biventricular ICD (implantable cardioverter-defibrillator) in place 12/07/2015  . Chest pain  at rest 11/29/2015  . Essential hypertension 11/29/2015  . Acute on chronic systolic CHF (congestive heart failure), NYHA class 1 ([place]) 11/29/2015  . Gastroesophageal reflux disease without esophagitis 11/29/2015  . Pain in the chest   . Renal cancer ([place]) 08/16/2015  . 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 Cancer of kidney ([place]) 01/19/2015  . Hematuria 09/04/2014  . Psychosexual  dysfunction with inhibited sexual excitement 07/29/2014  . Pelvic pain in male 07/29/2014  . Increased urinary frequency 07/29/2014  . Heartburn 07/29/2014  . EKG, abnormal 07/29/2014  . Corneal scar 07/29/2014  . 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 Cardiomyopathy ([place]) 07/29/2014  . CAD (coronary artery disease) 07/29/2014  . Disorder of kidney and ureter 04/09/2014    Past Surgical History:  Procedure Laterality Date  . CARDIAC CATHETERIZATION  1980's  . IMPLANTABLE CARDIOVERTER DEFIBRILLATOR IMPLANT  07/21/2010  . IR FLUORO GUIDE CV LINE RIGHT  01/23/2019  . IR GENERIC HISTORICAL  12/23/2014   IR RADIOLOGIST EVAL & MGMT 12/23/2014 [name], MD [facility] INTERV RAD  . IR GENERIC HISTORICAL  07/20/2016   IR RADIOLOGIST EVAL & MGMT 07/20/2016 [facility] INTERV RAD  . IR RADIOLOGIST EVAL & MGMT  07/04/2017  . IR RADIOLOGIST EVAL & MGMT  07/08/2019  . IR RADIOLOGIST EVAL & MGMT  09/16/2019  . IR US GUIDE VASC ACCESS RIGHT  01/23/2019  . RENAL CRYOABLATION Left 05/2014  . RIGHT/LEFT HEART CATH AND CORONARY ANGIOGRAPHY N/A 01/07/2019   Procedure: RIGHT/LEFT HEART CATH AND CORONARY ANGIOGRAPHY;  Surgeon: Jolaine Artist, MD;  Location: [facility];  Service: Cardiovascular;  Laterality: N/A;       Family History  Problem Relation Age of Onset  . Heart disease Father   . Heart attack Father 35  . Hypertension Father   . Hypertension Mother     Social History   Tobacco Use  . Smoking status: Former Smoker    Packs/day: 0.75    Years: 17.00    Pack years: 12.75    Types: Cigarettes    Start date: 05/08/1959    Quit date: 06/20/1976    Years since quitting: 43.5  . Smokeless tobacco: Never Used  Substance Use Topics  . Alcohol use: No  . Drug use: No    Home Medications Prior to Admission medications   Medication Sig Start Date End Date Taking? Authorizing Provider  acetaminophen (TYLENOL) 325 MG tablet Take 2 tablets (650 mg total) by mouth every 4 (four) hours  as needed for headache or mild pain. 01/29/19   Shirley Friar, PA-C  amiodarone (PACERONE) 200 MG tablet TAKE 1 TABLET BY MOUTH EVERY DAY 12/22/19   Bensimhon, Shaune Pascal, MD  apixaban (ELIQUIS) 5 MG TABS tablet Take 1 tablet (5 mg total) by mouth 2 (two) times daily. 10/30/19   Donne Hazel, MD  docusate sodium (COLACE) 100 MG capsule Take 100 mg by mouth every other day.    [provider]  lactobacillus (FLORANEX/LACTINEX) PACK Take 1 packet (1 g total) by mouth 3 (three) times daily with meals. Patient not taking: Reported on 10/24/2019 02/14/19   Hennie Duos, MD  metolazone (ZAROXOLYN) 2.5 MG tablet Take 1 tablet (2.5 mg total) by mouth 2 (two) times a week. Every Thursay and Sunday 10/30/19 01/28/20  Donne Hazel, MD  milrinone [facility]) 20 MG/100 ML SOLN infusion Inject 0.0388 mg/min into the vein continuous. PER [facility] 10/29/19   Donne Hazel, MD  mometasone-formoterol (DULERA) 200-5 MCG/ACT AERO Inhale 2 puffs into the lungs 2 (two) times daily. 02/14/19   Hennie Duos, MD  OXYGEN Inhale 2 L into the lungs continuous.    [provider]  pantoprazole (PROTONIX) 40 MG tablet Take 1 tablet (40 mg total) by mouth daily. 08/23/19   Strader, Fransisco Hertz, PA-C  polyethylene glycol (MIRALAX / GLYCOLAX) packet Take 17 g by mouth daily. Patient not taking: Reported on 10/24/2019 02/14/19   Hennie Duos, MD  potassium chloride SA (KLOR-CON M20) 20 MEQ tablet Take 2 tablets (40 mEq total) by mouth 2 (two) times daily. Patient taking differently: Take 40 mEq by mouth daily.  09/23/19   Bensimhon, Shaune Pascal, MD  silodosin (RAPAFLO) 8 MG CAPS capsule Take 8 mg by mouth daily. 10/09/19   [provider]  sucralfate (CARAFATE) 1 g tablet Take 1 tablet (1 g total) by mouth 4 (four) times daily -  with meals and at bedtime. Patient taking differently: Take 2 g by mouth 2 (two) times daily.  02/14/19   Hennie Duos, MD  tamsulosin (FLOMAX) 0.4 MG  CAPS capsule Take 2 capsules (0.8 mg total) by mouth daily after supper. Patient not taking: Reported on 10/24/2019 02/14/19   Hennie Duos, MD  torsemide (DEMADEX) 20 MG tablet Take 3 tablets (60 mg total) by mouth 2 (two) times daily. 10/29/19   Donne Hazel, MD    Allergies    Patient has no known allergies.  Review of Systems   Review of Systems All other systems reviewed and are negative except that which was mentioned in HPI  Physical Exam Updated Vital Signs BP 101/60   Pulse 79   Temp 98 F (36.7 C) (Oral)   Resp 14   SpO2 100%   Physical Exam Vitals and nursing note reviewed. Exam conducted with a chaperone present.  Constitutional:      General: He is not in acute distress.    Appearance: He is well-developed.  HENT:     Head: Normocephalic and atraumatic.  Eyes:     Conjunctiva/sclera: Conjunctivae normal.     Pupils: Pupils are equal, round, and reactive to light.  Cardiovascular:     Rate and Rhythm: Normal rate and regular rhythm.     Heart sounds: Murmur present.  Pulmonary:     Effort: Pulmonary effort is normal.     Comments: Diminished BS b/l bases Abdominal:     General: Bowel sounds are normal. There is no distension.     Palpations: Abdomen is soft.     Tenderness: There is no abdominal tenderness.  Genitourinary:    Comments: Edema of scrotum w/o induration, skin changes, or focal tenderness Musculoskeletal:     Cervical back: Neck supple.     Right lower leg: Edema present.     Left lower leg: Edema present.     Comments: 2-3+ pitting edema BLE  Skin:    General: Skin is warm and dry.     Comments: CVC in R upper chest, no erythema or drainage  Neurological:     Mental Status: He is alert and oriented to person, place, and time.     Comments: Fluent speech  Psychiatric:        Judgment: Judgment normal.     ED Results / Procedures / Treatments   Labs (all labs ordered are listed, but only abnormal results are displayed) Labs  Reviewed  COMPREHENSIVE METABOLIC PANEL  BRAIN NATRIURETIC PEPTIDE  CBC  WITH DIFFERENTIAL/PLATELET  COOXEMETRY PANEL  POC SARS CORONAVIRUS 2 AG -  ED    EKG None  Radiology DG Chest 2 View  Result Date: 12/24/2019 CLINICAL DATA:  Lower extremity edema.  Heart failure. EXAM: CHEST - 2 VIEW COMPARISON:  October 23, 2019. FINDINGS: Stable severe cardiomegaly. Left-sided pacemaker is unchanged in position. No pneumothorax is noted. Right-sided PICC line is noted. Mild bibasilar atelectasis and small pleural effusions are noted. Bony thorax is unremarkable. IMPRESSION: Mild bibasilar subsegmental atelectasis and small pleural effusions are noted. Stable severe cardiomegaly. Electronically Signed   By: Marijo Conception M.D.   On: 12/24/2019 15:39    Procedures Procedures (including critical care time)  Medications Ordered in ED Medications  furosemide (LASIX) injection 80 mg (has no administration in time range)  lactulose (CHRONULAC) 10 GM/15ML solution 30 g (has no administration in time range)  polyethylene glycol (MIRALAX / GLYCOLAX) packet 17 g (has no administration in time range)  milrinone (PRIMACOR) 20 MG/100 ML (0.2 mg/mL) infusion (has no administration in time range)    ED Course  I have reviewed the triage vital signs and the nursing notes.  Pertinent labs & imaging results that were available during my care of the patient were reviewed by me and considered in my medical decision making (see chart for details).    MDM Rules/Calculators/A&P                      No respiratory distress on exam, stable vital signs, suspect scrotal edema is due to volume overload rather than acute testicular process.  Chest x-ray shows small bilateral pleural effusions.  Heart failure team has evaluated the patient and I have ordered screening labs including BMP, BNP, CBC.  They will admit the patient for diuresis and further care. Final Clinical Impression(s) / ED Diagnoses Final diagnoses:   None    Rx / DC Orders ED Discharge Orders    None       Cedricka Sackrider, Wenda Overland, MD 12/24/19 1620

## 2019-12-24 NOTE — Discharge Planning (Signed)
Pt currently active with [facility] for hospice services.  Resumption of care requested. Glyn Ade of [facility] notified.  No DME needs identified at this time.

## 2019-12-24 NOTE — ED Notes (Signed)
Per MD Bensimhon, Medications including Lasix Infusion and 80 mg are appropriate to administer despite low blood pressure. Will continue to monitor

## 2019-12-24 NOTE — ED Triage Notes (Signed)
Pt here from home with c/o testicle swelling and constipation , pt has chf and is hospice . Pt is on otc  O2

## 2019-12-24 NOTE — ED Notes (Signed)
Please call Colin Broach (patient's son) [phone] when patient gets a room

## 2019-12-24 NOTE — H&P (Addendum)
Advanced Heart Failure Team H&P   Primary Physician: [name]., MD HF -Cardiologist:  Dr Haroldine Laws Reason for Consultation: Heart Failure   HPI:    Spencer Rice is seen today for evaluation of heart failure at the request of Dr Rex Kras.   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   Admitted 1/26-2/26/2020 with cardiogenic shock.PICC placed 1/27 with initial CVP 26 coox 38% -> Milrinone started with improved diuresis and renal function. Echo 01/01/19 EF 10% with severe Spencer/TR, biatrial enlargement, RV with moderately reduced function. Taken for [facility] 01/07/19 with Mild, non-obstructive CAD, Moderate PAH with normal PAPI, and normal CO on milrinone 0.25 mcg/kg/min. PFTs 01/10/2001 FVC 2.28 (54%), FEV 2.0 (64%), DLCO 14.64 (53%). He was considered for  LVADbut had spontaneous RP bleed with symptomatic anemia complicating his admission. He was discharged on milrinone.   Readmitted in 11/19 -10/29/19 with A/C biventricular heart failure. He was admitted on milrinone 0.25 mcg but due to persistently low mixed venous saturations milrinone was increased to 0.5 mcg. He was diuresed with lasix drip at 15 mg per hour  and transitioned to torsemide 60 mg twice a day + metolazone 2.5 mg twice a week. CO-OX at the time discharge was 43% and CVP was 15. He met with Palliative Care and did not want to turn off ICD. He was adamant he wanted to go home. He was discharged on 10/29/19 with Hospice services provided by [facility].   Presented to [facility] via EMS complaining shortness of breath, constipation, and thigh/scrotal swelling. Says he has been taking his medications but over the last 2 weeks he has had  increased leg edema. He has only been taking torsemide 40 mg twice a day instead of 60 mg twice a day.  Says he has been taking  metolazone but not having much urine output. He had a foley last week but it was removed?  Now having difficulty urinating. No BM in 3 days. No fever or chills. SOB with exertion. Appetite has been fair. No issues with home milrinone.   CXR- small pleural effusions. Labs pending.    Review of Systems: [y] = yes, _0  = no   . General: Weight gain [Y ]; Weight loss _1 ; Anorexia _2 ; Fatigue [Y ]; Fever _3 ; Chills _4 ; Weakness _5   . Cardiac: Chest pain/pressure _6 ; Resting SOB _7 ; Exertional SOB [Y ]; Orthopnea _8 ; Pedal Edema [Y ]; Palpitations _9 ; Syncope _10 ; Presyncope _11 ; Paroxysmal nocturnal dyspnea_12   . Pulmonary: Cough _13 ; Wheezing_14 ; Hemoptysis_15 ; Sputum _16 ; Snoring _17   . GI: Vomiting_18 ; Dysphagia_19 ; Melena_20 ; Hematochezia _21 ; Heartburn_22 ; Abdominal pain _23 ; Constipation [Y ]; Diarrhea _24 ; BRBPR _25   . GU: Hematuria_26 ; Dysuria _27 ; Nocturia_28   . Vascular: Pain in legs with walking _29 ; Pain in feet with lying flat _30 ; Non-healing sores _31 ; Stroke _32 ; TIA _33 ; Slurred speech _34 ;  . Neuro: Headaches_35 ; Vertigo_36 ; Seizures_37 ; Paresthesias_38 ;Blurred vision _39 ; Diplopia _40 ; Vision changes _41   . Ortho/Skin: Arthritis _42 ; Joint pain [Y ]; Muscle pain _43 ; Joint swelling _44 ; Back Pain [ Y]; Rash _45   .  Psych: Depression_0 ; Anxiety_1   . Heme: Bleeding problems _2 ; Clotting disorders _3 ; Anemia _4   . Endocrine: Diabetes _5 ; Thyroid dysfunction_6   Home Medications Prior to Admission medications   Medication Sig Start Date End Date Taking? Authorizing Provider  acetaminophen (TYLENOL) 325 MG tablet Take 2 tablets (650 mg total) by mouth every 4 (four) hours as needed for headache or mild pain. 01/29/19   Shirley Friar, PA-C  amiodarone (PACERONE) 200 MG tablet TAKE 1 TABLET BY MOUTH EVERY DAY 12/22/19   Lianah Peed, Shaune Pascal, MD  apixaban (ELIQUIS) 5 MG TABS tablet Take 1 tablet (5 mg total) by mouth 2 (two) times daily. 10/30/19   Donne Hazel,  MD  docusate sodium (COLACE) 100 MG capsule Take 100 mg by mouth every other day.    [provider]  lactobacillus (FLORANEX/LACTINEX) PACK Take 1 packet (1 g total) by mouth 3 (three) times daily with meals. Patient not taking: Reported on 10/24/2019 02/14/19   Hennie Duos, MD  metolazone (ZAROXOLYN) 2.5 MG tablet Take 1 tablet (2.5 mg total) by mouth 2 (two) times a week. Every Thursay and Sunday 10/30/19 01/28/20  Donne Hazel, MD  milrinone [facility]) 20 MG/100 ML SOLN infusion Inject 0.0388 mg/min into the vein continuous. PER [facility] 10/29/19   Donne Hazel, MD  mometasone-formoterol [facility]) 200-5 MCG/ACT AERO Inhale 2 puffs into the lungs 2 (two) times daily. 02/14/19   Hennie Duos, MD  OXYGEN Inhale 2 L into the lungs continuous.    [provider]  pantoprazole (PROTONIX) 40 MG tablet Take 1 tablet (40 mg total) by mouth daily. 08/23/19   Strader, Fransisco Hertz, PA-C  polyethylene glycol (MIRALAX / GLYCOLAX) packet Take 17 g by mouth daily. Patient not taking: Reported on 10/24/2019 02/14/19   Hennie Duos, MD  potassium chloride SA (KLOR-CON M20) 20 MEQ tablet Take 2 tablets (40 mEq total) by mouth 2 (two) times daily. Patient taking differently: Take 40 mEq by mouth daily.  09/23/19   Aliyana Dlugosz, Shaune Pascal, MD  silodosin (RAPAFLO) 8 MG CAPS capsule Take 8 mg by mouth daily. 10/09/19   [provider]  sucralfate (CARAFATE) 1 g tablet Take 1 tablet (1 g total) by mouth 4 (four) times daily -  with meals and at bedtime. Patient taking differently: Take 2 g by mouth 2 (two) times daily.  02/14/19   Hennie Duos, MD  tamsulosin (FLOMAX) 0.4 MG CAPS capsule Take 2 capsules (0.8 mg total) by mouth daily after supper. Patient not taking: Reported on 10/24/2019 02/14/19   Hennie Duos, MD  torsemide (DEMADEX) 20 MG tablet Take 3 tablets (60 mg total) by mouth 2 (two) times daily. 10/29/19   Donne Hazel, MD    Past Medical  History: Past Medical History:  Diagnosis Date  . AICD (automatic cardioverter/defibrillator) present   . Cancer of left kidney ([place])    S/P OR 05/2014  . CHF (congestive heart failure) ([place])   . CKD (chronic kidney disease)   . 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 Coronary artery disease   . DVT (deep venous thrombosis) ([facility]) 1983   LLE  . GERD (gastroesophageal reflux disease)   . History of hiatal hernia   . Hypertension     Past Surgical History: Past Surgical History:  Procedure Laterality Date  . CARDIAC CATHETERIZATION  1980's  . IMPLANTABLE CARDIOVERTER DEFIBRILLATOR IMPLANT  07/21/2010  .  IR FLUORO GUIDE CV LINE RIGHT  01/23/2019  . IR GENERIC HISTORICAL  12/23/2014   IR RADIOLOGIST EVAL & MGMT 12/23/2014 [name], MD [facility] INTERV RAD  . IR GENERIC HISTORICAL  07/20/2016   IR RADIOLOGIST EVAL & MGMT 07/20/2016 [facility] INTERV RAD  . IR RADIOLOGIST EVAL & MGMT  07/04/2017  . IR RADIOLOGIST EVAL & MGMT  07/08/2019  . IR RADIOLOGIST EVAL & MGMT  09/16/2019  . IR US GUIDE VASC ACCESS RIGHT  01/23/2019  . RENAL CRYOABLATION Left 05/2014  . RIGHT/LEFT HEART CATH AND CORONARY ANGIOGRAPHY N/A 01/07/2019   Procedure: RIGHT/LEFT HEART CATH AND CORONARY ANGIOGRAPHY;  Surgeon: Jolaine Artist, MD;  Location: [facility];  Service: Cardiovascular;  Laterality: N/A;    Family History: Family History  Problem Relation Age of Onset  . Heart disease Father   . Heart attack Father 46  . Hypertension Father   . Hypertension Mother     Social History: Social History   Socioeconomic History  . Marital status: Married    Spouse name: Not on file  . Number of children: Not on file  . Years of education: Not on file  . Highest education level: Not on file  Occupational History  . Not on file  Tobacco Use  . Smoking status: Former Smoker    Packs/day: 0.75    Years: 17.00    Pack years: 12.75    Types: Cigarettes    Start date:  05/08/1959    Quit date: 06/20/1976    Years since quitting: 43.5  . Smokeless tobacco: Never Used  Substance and Sexual Activity  . Alcohol use: No  . Drug use: No  . Sexual activity: Not on file  Other Topics Concern  . Not on file  Social History Narrative  . Not on file   Social Determinants of Health   Financial Resource Strain:   . Difficulty of Paying Living Expenses: Not on file  Food Insecurity:   . Worried About Charity fundraiser in the Last Year: Not on file  . Ran Out of Food in the Last Year: Not on file  Transportation Needs:   . Lack of Transportation (Medical): Not on file  . Lack of Transportation (Non-Medical): Not on file  Physical Activity:   . Days of Exercise per Week: Not on file  . Minutes of Exercise per Session: Not on file  Stress:   . Feeling of Stress : Not on file  Social Connections:   . Frequency of Communication with Friends and Family: Not on file  . Frequency of Social Gatherings with Friends and Family: Not on file  . Attends Religious Services: Not on file  . Active Member of Clubs or Organizations: Not on file  . Attends Archivist Meetings: Not on file  . Marital Status: Not on file    Allergies:  No Known Allergies  Objective:    Vital Signs:   Temp:  [98 F (36.7 C)] 98 F (36.7 C) (01/20 1300) Pulse Rate:  [77-82] 77 (01/20 1730) Resp:  [14-19] 17 (01/20 1730) BP: (88-126)/(49-71) 88/59 (01/20 1730) SpO2:  [100 %] 100 % (01/20 1730) Weight:  [78.5 kg] 78.5 kg (01/20 1600)    Weight change: Filed Weights   12/24/19 1600  Weight: 78.5 kg    Intake/Output:  No intake or output data in the 24 hours ending 12/24/19 1344    Physical Exam    General:  . No resp difficulty  HEENT: normal Neck: supple. JVP to jaw.  Carotids 2+ bilat; no bruits. No lymphadenopathy or thyromegaly appreciated. Cor: PMI laterally displaced. Regular rate & rhythm. No rubs, or murmurs. +S3 . R upper chest tunneled catheter.   Lungs: clear Abdomen: soft, nontender, ++ distended. No hepatosplenomegaly. No bruits or masses. Good bowel sounds. Extremities: no cyanosis, clubbing, rash, R and LLE 2-3+ edema extending to thighs.  + scrotal edema Neuro: alert & orientedx3, cranial nerves grossly intact. moves all 4 extremities w/o difficulty. Affect pleasant   Telemetry   SR 70s with PVCs Personally reviewed    EKG   n/a   Labs   Basic Metabolic Panel: No results for input(s): NA, K, CL, CO2, GLUCOSE, BUN, CREATININE, CALCIUM, MG, PHOS in the last 168 hours.  Liver Function Tests: No results for input(s): AST, ALT, ALKPHOS, BILITOT, PROT, ALBUMIN in the last 168 hours. No results for input(s): LIPASE, AMYLASE in the last 168 hours. No results for input(s): AMMONIA in the last 168 hours.  CBC: No results for input(s): WBC, NEUTROABS, HGB, HCT, MCV, PLT in the last 168 hours.  Cardiac Enzymes: No results for input(s): CKTOTAL, CKMB, CKMBINDEX, TROPONINI in the last 168 hours.  BNP: BNP (last 3 results) Recent Labs    02/06/19 1850 08/21/19 2013 10/23/19 1048  BNP 2,172.9* 2,515.0* 3,200.7*    ProBNP (last 3 results) No results for input(s): PROBNP in the last 8760 hours.   CBG: No results for input(s): GLUCAP in the last 168 hours.  Coagulation Studies: No results for input(s): LABPROT, INR in the last 72 hours.   Imaging    No results found.   Medications:     Current Medications:    Infusions:       Assessment/Plan   1. 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 [place] 2015 with moderate disease. Has Biotronik BiV ICD. - ECHO 10/22/19 EF severely EF 5-10% with severe Spencer/TR, biatrial enlargement, RV with moderately reduced function - Continue milrinone 0.5 mcg.  - Volume status elevated. He does not appear to be in distress.  Given 80 mg IV lasix and will start lasix drip at 10 mg per  hour.   - Check BMET/BNP - CXR- small pleural effusions.  2. Urinary Retention  -He had foley catheter until last week.  -Check bladder scan and may need to reinsert foley.  3. CKD Stage IIIb - Recent creatinine baseline 2.5-2.8  - Check BMET  4.  H/O RLE DVT -Continue eliquis 5 mg twice a day.  - Check CBC now.  5.  H/O RP Bleed  6. Constipation -Give lactulose and add stool softner.  7. DNR/DNI - Will need to discuss [place] with him.  -Active with Hospice services provided by [facility].    Admit to 3 [address] and try to diurese with lasix drip. Labs pending. Continue milrinone 0.5 mcg .   Length of Stay: 0  Darrick Grinder, NP  12/24/2019, 1:44 PM  Advanced Heart Failure Team Pager [phone] (M-F; 7a - 4p)  Please contact [facility] for night-coverage after hours (4p -7a ) and weekends on [email or web address]  Patient seen and examined with the above-signed Advanced Practice Provider and/or Housestaff. I personally reviewed laboratory data, imaging studies and relevant notes. I independently examined the patient and formulated the important aspects of the plan. I have edited the note to reflect any of my changes or salient points. I have personally discussed  the plan with the patient and/or family.  79 y/o male with end-stage systolic HF duet o iCM currently on home milrinone with hospice support.   Presents to ER today with marked volume overload not responding to increasing doses of torsemide and addition of metolazone. Recently had Foley catheter removed.   In ER co-ox stable at 62%. Creatinine stable.   General:  Weak appearing. No resp difficulty HEENT: normal Neck: supple. JVP to jaw Carotids 2+ bilat; no bruits. No lymphadenopathy or thryomegaly appreciated. Cor: PMI nondisplaced. Regular rate & rhythm. No rubs, gallops or murmurs. Lungs: clear Abdomen: soft, nontender, nondistended. No hepatosplenomegaly. No bruits or masses. Good bowel sounds. Extremities: no cyanosis, clubbing,  rash, 3+ edema into thighs.  Neuro: alert & orientedx3, cranial nerves grossly intact. moves all 4 extremities w/o difficulty. Affect pleasant  He is markedly volume overloaded and not responding to increased oral diuretics. Co-ox ok. Will continue milrinone. Admit for IV diuresis.   Glori Bickers, MD  5:39 PM

## 2019-12-24 NOTE — ED Notes (Signed)
Pt was able to urinate; no need to bladder scan at this moment; will reassess and monitor

## 2019-12-25 ENCOUNTER — Other Ambulatory Visit: Payer: Self-pay

## 2019-12-25 ENCOUNTER — Encounter (HOSPITAL_COMMUNITY): Payer: Self-pay | Admitting: Internal Medicine

## 2019-12-25 LAB — RESPIRATORY PANEL BY RT PCR (FLU A&B, COVID)
Influenza A by PCR: NEGATIVE
Influenza B by PCR: NEGATIVE
SARS Coronavirus 2 by RT PCR: NEGATIVE

## 2019-12-25 LAB — COOXEMETRY PANEL
Carboxyhemoglobin: 2.4 % — ABNORMAL HIGH (ref 0.5–1.5)
Methemoglobin: 1.1 % (ref 0.0–1.5)
O2 Saturation: 79.6 %
Total hemoglobin: 8.9 g/dL — ABNORMAL LOW (ref 12.0–16.0)

## 2019-12-25 LAB — SARS CORONAVIRUS 2 (TAT 6-24 HRS): SARS Coronavirus 2: NEGATIVE

## 2019-12-25 LAB — BASIC METABOLIC PANEL
Anion gap: 12 (ref 5–15)
BUN: 47 mg/dL — ABNORMAL HIGH (ref 8–23)
CO2: 22 mmol/L (ref 22–32)
Calcium: 9 mg/dL (ref 8.9–10.3)
Chloride: 98 mmol/L (ref 98–111)
Creatinine, Ser: 2.27 mg/dL — ABNORMAL HIGH (ref 0.61–1.24)
GFR calc Af Amer: 31 mL/min — ABNORMAL LOW (ref >60)
GFR calc non Af Amer: 27 mL/min — ABNORMAL LOW (ref >60)
Glucose, Bld: 108 mg/dL — ABNORMAL HIGH (ref 70–99)
Potassium: 4.6 mmol/L (ref 3.5–5.1)
Sodium: 132 mmol/L — ABNORMAL LOW (ref 135–145)

## 2019-12-25 LAB — MRSA PCR SCREENING: MRSA by PCR: NEGATIVE

## 2019-12-25 MED ORDER — METOLAZONE 5 MG PO TABS: 5.0000 mg | ORAL_TABLET | Freq: Once | ORAL | Status: DC

## 2019-12-25 MED ORDER — MILRINONE LACTATE IN DEXTROSE 20-5 MG/100ML-% IV SOLN
0.5000 ug/kg/min | INTRAVENOUS | Status: DC
  Administered 2019-12-25 – 2019-12-26 (×3): 0.5 ug/kg/min via INTRAVENOUS
  Filled 2019-12-25 (×2): qty 100

## 2019-12-25 MED ORDER — METOLAZONE 5 MG PO TABS
5.0000 mg | ORAL_TABLET | Freq: Every day | ORAL | Status: DC
  Administered 2019-12-25 – 2019-12-30 (×6): 5 mg via ORAL
  Filled 2019-12-25 (×6): qty 1

## 2019-12-25 NOTE — Progress Notes (Signed)
Orthopedic Tech Progress Note Patient Details:  Spencer Rice [birth year] [id number]  Ortho Devices Type of Ortho Device: Ace wrap, Unna boot Ortho Device/Splint Location: Bilateral unna boots Ortho Device/Splint Interventions: Application   Post Interventions Patient Tolerated: Well Instructions Provided: Care of device   Spencer Rice 12/25/2019, 11:33 AM

## 2019-12-25 NOTE — Progress Notes (Addendum)
Advanced Heart Failure Rounding Note  PCP-Cardiologist: No primary care provider on file.   Subjective:    Admitted with volume overload. Given 80 mg IV lasix and started on lasix drip at 10 mg per hour.   Remains on milrinone 0.5 mcg. Todays CO-OX is 80%. Creatinine stable 2.3 .   Feeling a little better today. Denies SOB. Wants to eat.     Objective:   Weight Range: 95.1 kg Body mass index is 27.65 kg/m.   Vital Signs:   Temp:  [98 F (36.7 C)] 98 F (36.7 C) (01/20 1300) Pulse Rate:  [74-85] 79 (01/21 0825) Resp:  [13-24] 19 (01/21 0825) BP: (80-126)/(41-80) 92/41 (01/21 0825) SpO2:  [100 %] 100 % (01/21 0825) Weight:  [78.5 kg-95.1 kg] 95.1 kg (01/20 1824)    Weight change: Filed Weights   12/24/19 1600 12/24/19 1824  Weight: 78.5 kg 95.1 kg    Intake/Output:   Intake/Output Summary (Last 24 hours) at 12/25/2019 0845 Last data filed at 12/24/2019 1706 Gross per 24 hour  Intake --  Output 420 ml  Net -420 ml      Physical Exam    General:  In bed.  No resp difficulty HEENT: Normal Neck: Supple. JVP to ear . Carotids 2+ bilat; no bruits. No lymphadenopathy or thyromegaly appreciated. Cor: PMI nondisplaced. Regular rate & rhythm. No rubs, gallops or murmurs. Lungs: Clear Abdomen: Soft, nontender, nondistended. No hepatosplenomegaly. No bruits or masses. Good bowel sounds. Extremities: No cyanosis, clubbing, rash, R and LE 2+ edema Neuro: Alert & orientedx3, cranial nerves grossly intact. moves all 4 extremities w/o difficulty. Affect pleasant   Telemetry   SR 70-80s personally checked.   EKG   n/a   Labs    CBC Recent Labs    12/24/19 1657  WBC 4.5  NEUTROABS 3.4  HGB 9.7*  HCT 31.7*  MCV 65.0*  PLT 295   Basic Metabolic Panel Recent Labs    12/24/19 1657 12/25/19 0449  NA 132* 132*  K 4.4 4.6  CL 96* 98  CO2 23 22  GLUCOSE 90 108*  BUN 47* 47*  CREATININE 2.40* 2.27*  CALCIUM 9.2 9.0   Liver Function Tests Recent  Labs    12/24/19 1657  AST 38  ALT 17  ALKPHOS 104  BILITOT 3.8*  PROT 7.1  ALBUMIN 2.8*   No results for input(s): LIPASE, AMYLASE in the last 72 hours. Cardiac Enzymes No results for input(s): CKTOTAL, CKMB, CKMBINDEX, TROPONINI in the last 72 hours.  BNP: BNP (last 3 results) Recent Labs    08/21/19 2013 10/23/19 1048 12/24/19 1657  BNP 2,515.0* 3,200.7* 2,727.7*    ProBNP (last 3 results) No results for input(s): PROBNP in the last 8760 hours.   D-Dimer No results for input(s): DDIMER in the last 72 hours. Hemoglobin A1C No results for input(s): HGBA1C in the last 72 hours. Fasting Lipid Panel No results for input(s): CHOL, HDL, LDLCALC, TRIG, CHOLHDL, LDLDIRECT in the last 72 hours. Thyroid Function Tests No results for input(s): TSH, T4TOTAL, T3FREE, THYROIDAB in the last 72 hours.  Invalid input(s): FREET3  Other results:   Imaging    DG Chest 2 View  Result Date: 12/24/2019 CLINICAL DATA:  Lower extremity edema.  Heart failure. EXAM: CHEST - 2 VIEW COMPARISON:  October 23, 2019. FINDINGS: Stable severe cardiomegaly. Left-sided pacemaker is unchanged in position. No pneumothorax is noted. Right-sided PICC line is noted. Mild bibasilar atelectasis and small pleural effusions are noted. Bony thorax  is unremarkable. IMPRESSION: Mild bibasilar subsegmental atelectasis and small pleural effusions are noted. Stable severe cardiomegaly. Electronically Signed   By: Marijo Conception M.D.   On: 12/24/2019 15:39      Medications:     Scheduled Medications: . amiodarone  200 mg Oral Daily  . apixaban  5 mg Oral BID  . Chlorhexidine Gluconate Cloth  6 each Topical Daily  . docusate sodium  100 mg Oral QODAY  . lactobacillus  1 g Oral TID WC  . pantoprazole  40 mg Oral Daily  . polyethylene glycol  17 g Oral Daily  . potassium chloride SA  40 mEq Oral Daily  . sodium chloride flush  10-40 mL Intracatheter Q12H  . sucralfate  1 g Oral TID WC & HS  .  tamsulosin  0.4 mg Oral QPC supper     Infusions: . furosemide (LASIX) infusion 10 mg/hr (12/24/19 1825)  . milrinone 0.5 mcg/kg/min (12/25/19 0825)     PRN Medications:  acetaminophen, sodium chloride flush     Assessment/Plan   1. 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 [place] 2015 with moderate disease. Has Biotronik BiV ICD. - ECHO 10/22/19 EF severely EF 5-10% with severe MR/TR, biatrial enlargement, RV with moderately reduced function - Continue milrinone 0.5 mcg.  - Volume status remains elevated. Continue lasix drip 10 mg per hour.   -No bb with low output. No Arb/spiro/dig with elevated creatinine.  - Replace unna boots.  - Renal function stable.  - CXR- small pleural effusions.  2. Urinary Retention  -He had foley catheter until last week.  - Urine output adequate. On flomax.  3. CKD Stage IIIb - Recent creatinine baseline 2.5-2.8  - Creatinine 2.3 today. Stable.  4.  H/O RLE DVT -Continue eliquis 5 mg twice a day.  Hgb stable 5.  H/O RP Bleed  6. Constipation -Give lactulose and add stool softner.  7. DNR/DNI - Discussed code status. He confirms DNR/DNI. He wants to keep ICD on. He had GOLD DNR form with him.  -Active with Hospice services provided by [facility].     Length of Stay: 1  Amy Clegg, NP  12/25/2019, 8:45 AM  Advanced Heart Failure Team Pager [phone] (M-F; 7a - 4p)  Please contact [facility] for night-coverage after hours (4p -7a ) and weekends on [email or web address]  Patient seen and examined with the above-signed Advanced Practice Provider and/or Housestaff. I personally reviewed laboratory data, imaging studies and relevant notes. I independently examined the patient and formulated the important aspects of the plan. I have edited the note to reflect any of my changes or salient points. I have personally discussed the plan with the patient and/or  family.  Co-ox stable on milrinone. CVP high. Not diuresing very well on lasix gtt despite massive volume overload. Renal function slightly improved.   General:  Weak appearing. No resp difficulty HEENT: normal Neck: supple. JVP to jaw . Carotids 2+ bilat; no bruits. No lymphadenopathy or thryomegaly appreciated. Cor: PMI laterally displaced. Regular rate & rhythm. +s3 Lungs: clear Abdomen: soft, nontender, + distended. No hepatosplenomegaly. No bruits or masses. Good bowel sounds. Extremities: no cyanosis, clubbing, rash, 3+ edema Neuro: alert & orientedx3, cranial nerves grossly intact. moves all 4 extremities w/o difficulty. Affect pleasant  Remains tenuous. Marked volume overload. Continue milrinone. Increase lasix to 20/hr add metolazone. He confirms DNR/DNI. Continue Eliquis for DVT.   Glori Bickers, MD  10:49 AM

## 2019-12-25 NOTE — ED Notes (Signed)
Contacted lab to get an update on when the Covid test would result; lab tech on lunch and will call me when he gets back.

## 2019-12-25 NOTE — ED Notes (Signed)
Tele PUI Breakfast ordered  

## 2019-12-25 NOTE — TOC Initial Note (Signed)
Transition of Care [facility]) - Initial/Assessment Note    Patient Details  Name: Spencer Rice MRN: [id number] Date of Birth: 10-20-1941  Transition of Care [facility]) CM/SW Contact:    Alberteen Sam,  Phone Number: [phone] 12/25/2019, 11:58 AM  Clinical Narrative:                  CSW acknowledges in chart that patient is active with [facility]. CSW lvm with patient's son to ensure dc plan is to dc back home with hospice. CSW spoke with Dorian Pod with [facility] to confirm he's currently active with their home hospice services and plan at discharge would be for patient to return home with continued home hospice.   CSW to continue to follow for discharge planning needs.   Expected Discharge Plan: Home w Hospice Care Barriers to Discharge: Continued Medical Work up   Patient Goals and CMS Choice   CMS Medicare.gov Compare Post Acute Care list provided to:: Patient Represenative (must comment)(son Myriam Jacobson) Choice offered to / list presented to : Adult Children  Expected Discharge Plan and Services Expected Discharge Plan: [place] Acute Care Choice: Hospice Living arrangements for the past 2 months: Single Family Home                                      Prior Living Arrangements/Services Living arrangements for the past 2 months: Single Family Home Lives with:: Adult Children Patient language and need for interpreter reviewed:: Yes Do you feel safe going back to the place where you live?: Yes      Need for Family Participation in Patient Care: Yes (Comment) Care giver support system in place?: Yes (comment)   Criminal Activity/Legal Involvement Pertinent to Current Situation/Hospitalization: No - Comment as needed  Activities of Daily Living Home Assistive Devices/Equipment: Walker (specify type), Crutches ADL Screening (condition at time of admission) Patient's cognitive ability adequate to safely complete daily activities?: Yes Is  the patient deaf or have difficulty hearing?: No Does the patient have difficulty seeing, even when wearing glasses/contacts?: No Does the patient have difficulty concentrating, remembering, or making decisions?: No Patient able to express need for assistance with ADLs?: Yes Does the patient have difficulty dressing or bathing?: No Independently performs ADLs?: Yes (appropriate for developmental age) Does the patient have difficulty walking or climbing stairs?: Yes Weakness of Legs: Both Weakness of Arms/Hands: None  Permission Sought/Granted Permission sought to share information with : Case Manager, Customer service manager, Family Supports Permission granted to share information with : Yes, Verbal Permission Granted  Share Information with NAME: Chao     Permission granted to share info w Relationship: son  Permission granted to share info w Contact Information: [phone]  Emotional Assessment Appearance:: Appears stated age Attitude/Demeanor/Rapport: Unable to Assess Affect (typically observed): Unable to Assess Orientation: : Oriented to Self, Oriented to Place, Oriented to  Time, Oriented to Situation Alcohol / Substance Use: Not Applicable Psych Involvement: No (comment)  Admission diagnosis:  Acute on chronic systolic (congestive) heart failure (HCC) [I50.23] Patient Active Problem List   Diagnosis Date Noted  . Advanced care planning/counseling discussion   . Goals of care, counseling/discussion   . Xerostomia   . Acute on chronic systolic heart failure ([place]) 10/23/2019  . Deep vein thrombosis (DVT) of right lower extremity ([place]) 10/23/2019  . Acute renal failure superimposed on stage 3a chronic  kidney disease ([place]) 10/23/2019  . Chronic combined systolic and diastolic CHF (congestive heart failure) ([place]) 02/08/2019  . Cardiogenic shock ([place]) 02/01/2019  . Volume overload 02/01/2019  . Retroperitoneal bleed 02/01/2019  . Urinary tract infection due to  Klebsiella species 02/01/2019  . Weakness generalized   . Tachypnea   . Sinus tachycardia   . Hyponatremia   . Acute blood loss anemia   . DNR (do not resuscitate) discussion   . Palliative care by specialist   . Malnutrition of moderate degree 01/06/2019  . CKD (chronic kidney disease) stage 3, GFR 30-59 ml/min 12/30/2018  . PVCs (premature ventricular contractions) 12/30/2018  . Acute on chronic systolic congestive heart failure ([place]) 12/29/2018  . CHF (congestive heart failure) ([place]) 05/03/2018  . Acute on chronic systolic (congestive) heart failure ([place]) 05/03/2018  . Glucose intolerance (impaired glucose tolerance) 06/23/2016  . Vitreous floaters 12/08/2015  . TIA (transient ischemic attack) 12/08/2015  . SOB (shortness of breath) 12/08/2015  . Posterior vitreous detachment of both eyes 12/08/2015  . Pain due to dental caries 12/08/2015  . Other abnormal glucose 12/08/2015  . Osteoarthrosis 12/08/2015  . Long term (current) use of anticoagulants 12/08/2015  . Kidney mass 12/08/2015  . Hypersomnolence 12/08/2015  . Hyperopia with presbyopia of both eyes 12/08/2015  . Hyperlipidemia 12/08/2015  . History of orthopnea 12/08/2015  . COPD (chronic obstructive pulmonary disease) ([place]) 12/08/2015  . 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 AKI (acute kidney injury) ([place]) 12/08/2015  . Abnormal CT scan, kidney 12/08/2015  . Abdominal bloating 12/08/2015  . Biventricular ICD (implantable cardioverter-defibrillator) in place 12/07/2015  . Chest pain at rest 11/29/2015  . Essential hypertension 11/29/2015  . Acute on chronic systolic CHF (congestive heart failure), NYHA class 1 ([place]) 11/29/2015  . Gastroesophageal reflux disease without esophagitis 11/29/2015  . Pain in the chest   . Renal cancer ([place]) 08/16/2015  . 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 Cancer of kidney ([place]) 01/19/2015  . Hematuria 09/04/2014  . Psychosexual dysfunction with inhibited sexual  excitement 07/29/2014  . Pelvic pain in male 07/29/2014  . Increased urinary frequency 07/29/2014  . Heartburn 07/29/2014  . EKG, abnormal 07/29/2014  . Corneal scar 07/29/2014  . 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 Cardiomyopathy ([place]) 07/29/2014  . CAD (coronary artery disease) 07/29/2014  . Disorder of kidney and ureter 04/09/2014   PCP:  [name]., MD Pharmacy:   [id number] [place], [place] [address] [place] [address] Phone: [phone] Fax: [phone]  [facility] [place], [place] [address] [place] [address] Phone: [phone] Fax: [phone]     Social Determinants of Health (SDOH) Interventions    Readmission Risk Interventions Readmission Risk Prevention Plan 10/27/2019  Transportation Screening Complete  Medication Review ([place]) Complete  PCP or Specialist appointment within 3-5 days of discharge Complete  HRI or [place] Complete  SW Recovery Care/Counseling Consult Complete  [place] Not Applicable  Some recent data might be hidden

## 2019-12-25 NOTE — ED Notes (Signed)
Per lab tech, Messan, Covid test returned "invalid" and must be recollected.

## 2019-12-25 NOTE — ED Notes (Signed)
Admitting paged to the RN per her request

## 2019-12-25 NOTE — Plan of Care (Signed)

## 2019-12-26 LAB — COOXEMETRY PANEL
Carboxyhemoglobin: 2 % — ABNORMAL HIGH (ref 0.5–1.5)
Carboxyhemoglobin: 2 % — ABNORMAL HIGH (ref 0.5–1.5)
Carboxyhemoglobin: 2 % — ABNORMAL HIGH (ref 0.5–1.5)
Methemoglobin: 1 % (ref 0.0–1.5)
Methemoglobin: 1.1 % (ref 0.0–1.5)
Methemoglobin: 1.2 % (ref 0.0–1.5)
O2 Saturation: 53.6 %
O2 Saturation: 48.3 %
O2 Saturation: 51.4 %
Total hemoglobin: 8.3 g/dL — ABNORMAL LOW (ref 12.0–16.0)
Total hemoglobin: 8.7 g/dL — ABNORMAL LOW (ref 12.0–16.0)
Total hemoglobin: 9 g/dL — ABNORMAL LOW (ref 12.0–16.0)

## 2019-12-26 LAB — CBC
HCT: 27 % — ABNORMAL LOW (ref 39.0–52.0)
Hemoglobin: 8.4 g/dL — ABNORMAL LOW (ref 13.0–17.0)
MCH: 20 pg — ABNORMAL LOW (ref 26.0–34.0)
MCHC: 31.1 g/dL (ref 30.0–36.0)
MCV: 64.3 fL — ABNORMAL LOW (ref 80.0–100.0)
Platelets: 137 10*3/uL — ABNORMAL LOW (ref 150–400)
RBC: 4.2 MIL/uL — ABNORMAL LOW (ref 4.22–5.81)
RDW: 22.8 % — ABNORMAL HIGH (ref 11.5–15.5)
WBC: 5 10*3/uL (ref 4.0–10.5)
nRBC: 1 % — ABNORMAL HIGH (ref 0.0–0.2)

## 2019-12-26 LAB — BASIC METABOLIC PANEL
Anion gap: 12 (ref 5–15)
BUN: 47 mg/dL — ABNORMAL HIGH (ref 8–23)
CO2: 23 mmol/L (ref 22–32)
Calcium: 8.7 mg/dL — ABNORMAL LOW (ref 8.9–10.3)
Chloride: 93 mmol/L — ABNORMAL LOW (ref 98–111)
Creatinine, Ser: 2.37 mg/dL — ABNORMAL HIGH (ref 0.61–1.24)
GFR calc Af Amer: 29 mL/min — ABNORMAL LOW (ref >60)
GFR calc non Af Amer: 25 mL/min — ABNORMAL LOW (ref >60)
Glucose, Bld: 112 mg/dL — ABNORMAL HIGH (ref 70–99)
Potassium: 4.3 mmol/L (ref 3.5–5.1)
Sodium: 128 mmol/L — ABNORMAL LOW (ref 135–145)

## 2019-12-26 LAB — GLUCOSE, CAPILLARY: Glucose-Capillary: 122 mg/dL — ABNORMAL HIGH (ref 70–99)

## 2019-12-26 MED ORDER — SORBITOL 70 % SOLN
30.0000 mL | Freq: Once | Status: AC
  Administered 2019-12-26: 30 mL via ORAL
  Filled 2019-12-26: qty 30

## 2019-12-26 MED ORDER — DOBUTAMINE IN D5W 4-5 MG/ML-% IV SOLN
2.5000 ug/kg/min | INTRAVENOUS | Status: DC
  Administered 2019-12-26 – 2019-12-29 (×2): 2.5 ug/kg/min via INTRAVENOUS
  Filled 2019-12-26 (×2): qty 250

## 2019-12-26 MED ORDER — DOCUSATE SODIUM 100 MG PO CAPS
100.0000 mg | ORAL_CAPSULE | Freq: Every day | ORAL | Status: DC
  Administered 2019-12-27 – 2020-01-05 (×9): 100 mg via ORAL
  Filled 2019-12-26 (×10): qty 1

## 2019-12-26 MED ORDER — MILRINONE LACTATE IN DEXTROSE 20-5 MG/100ML-% IV SOLN
0.5000 ug/kg/min | INTRAVENOUS | Status: DC
  Administered 2019-12-26 – 2020-01-05 (×34): 0.5 ug/kg/min via INTRAVENOUS
  Filled 2019-12-26 (×18): qty 100
  Filled 2019-12-26: qty 200
  Filled 2019-12-26 (×13): qty 100

## 2019-12-26 NOTE — Progress Notes (Addendum)
Advanced Heart Failure Rounding Note  PCP-Cardiologist: No primary care provider on file.   Subjective:    Admitted with volume overload. Given 80 mg IV lasix on admit and started on lasix drip. Rate increased yesterday to 20 mg/hr.   Diuresing but sluggish. Only -2.2L out yesterday on lasix gtt, + 900 cc out thus far today. Wt down 3 lb. SCr fairly stable at 2.37 (2.27 yesterday)  Remains on milrinone 0.5 mcg. Initial Co-ox this am 54%. Repeat Co-ox 48%. BP low 82/56. MAP 65  Na down to 128. Mentating ok.   Overall, feels ok but remains markedly volume overloaded w/ 3+ bilateral LEE up to thighs. CVP 16      Objective:   Weight Range: 93.7 kg Body mass index is 27.26 kg/m.   Vital Signs:   Temp:  [97.6 F (36.4 C)-98.5 F (36.9 C)] 98.1 F (36.7 C) (01/22 0743) Pulse Rate:  [74-82] 74 (01/22 0807) Resp:  [14-18] 16 (01/22 0807) BP: (84-92)/(55-68) 84/60 (01/22 0807) SpO2:  [98 %-100 %] 100 % (01/22 0807) Weight:  [93.7 kg] 93.7 kg (01/22 0517)    Weight change: Filed Weights   12/24/19 1600 12/24/19 1824 12/26/19 0517  Weight: 78.5 kg 95.1 kg 93.7 kg    Intake/Output:   Intake/Output Summary (Last 24 hours) at 12/26/2019 0846 Last data filed at 12/26/2019 8546 Gross per 24 hour  Intake 1806.72 ml  Output 3175 ml  Net -1368.28 ml      Physical Exam    CVP 16  PHYSICAL EXAM: General:  Weak appearing. No respiratory difficulty HEENT: normal anicteric  Neck: supple. elevated JVD. Carotids 2+ bilat; no bruits. No lymphadenopathy or thyromegaly appreciated. Cor: PMI nondisplaced. Regular rate & rhythm 2/6 TR + prominent S3 Lungs: decreased BS at the bases  Abdomen:  nontender, + distended. No hepatosplenomegaly. No bruits or masses. Good bowel sounds. Extremities: no cyanosis, clubbing, rash, 3+ bilateral LE edema up to thighs Neuro: alert & oriented x 3, cranial nerves grossly intact. moves all 4 extremities w/o difficulty. Affect pleasant   Telemetry    SR 70-80s personally checked.   EKG   n/a   Labs    CBC Recent Labs    12/24/19 1657 12/26/19 0233  WBC 4.5 5.0  NEUTROABS 3.4  --   HGB 9.7* 8.4*  HCT 31.7* 27.0*  MCV 65.0* 64.3*  PLT 155 270*   Basic Metabolic Panel Recent Labs    12/25/19 0449 12/26/19 0233  NA 132* 128*  K 4.6 4.3  CL 98 93*  CO2 22 23  GLUCOSE 108* 112*  BUN 47* 47*  CREATININE 2.27* 2.37*  CALCIUM 9.0 8.7*   Liver Function Tests Recent Labs    12/24/19 1657  AST 38  ALT 17  ALKPHOS 104  BILITOT 3.8*  PROT 7.1  ALBUMIN 2.8*   No results for input(s): LIPASE, AMYLASE in the last 72 hours. Cardiac Enzymes No results for input(s): CKTOTAL, CKMB, CKMBINDEX, TROPONINI in the last 72 hours.  BNP: BNP (last 3 results) Recent Labs    08/21/19 2013 10/23/19 1048 12/24/19 1657  BNP 2,515.0* 3,200.7* 2,727.7*    ProBNP (last 3 results) No results for input(s): PROBNP in the last 8760 hours.   D-Dimer No results for input(s): DDIMER in the last 72 hours. Hemoglobin A1C No results for input(s): HGBA1C in the last 72 hours. Fasting Lipid Panel No results for input(s): CHOL, HDL, LDLCALC, TRIG, CHOLHDL, LDLDIRECT in the last 72 hours. Thyroid Function  Tests No results for input(s): TSH, T4TOTAL, T3FREE, THYROIDAB in the last 72 hours.  Invalid input(s): FREET3  Other results:   Imaging    No results found.   Medications:     Scheduled Medications: . amiodarone  200 mg Oral Daily  . apixaban  5 mg Oral BID  . Chlorhexidine Gluconate Cloth  6 each Topical Daily  . docusate sodium  100 mg Oral QODAY  . lactobacillus  1 g Oral TID WC  . metolazone  5 mg Oral Daily  . pantoprazole  40 mg Oral Daily  . polyethylene glycol  17 g Oral Daily  . sodium chloride flush  10-40 mL Intracatheter Q12H  . sucralfate  1 g Oral TID WC & HS  . tamsulosin  0.4 mg Oral QPC supper    Infusions: . furosemide (LASIX) infusion 20 mg/hr (12/26/19 7096)  . milrinone 0.5 mcg/kg/min  (12/26/19 0803)    PRN Medications: acetaminophen, sodium chloride flush     Assessment/Plan   1. 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 Halfway 2015 with moderate disease. Has Biotronik BiV ICD. - ECHO 10/22/19 EF severely EF 5-10% with severe MR/TR, biatrial enlargement, RV with moderately reduced function - Volume status remains elevated. CVP 16. Responding to IV lasix but diuresis sluggish. Only -2.2L out. Wt down 3 lb. Continue lasix drip @ 20 mg per hour and add dose of metolazone 2.5 mg x 1/ - On milrinone 0.5 mcg. BP soft. Initial Co-ox 54%. Repeat Co-ox 48%. Will add Dobutamine 2.5 mcg to milrinone to help w/ CO and diuresis. Repeat Co-ox this afternoon.   -No bb with low output. No Arb/spiro/dig with elevated creatinine.  - continue w/ unna boots.  - Renal function fairly stable, 2.37 today - CXR- small pleural effusions.  2. Urinary Retention  -He had foley catheter until last week.  - Urine output adequate. On flomax.  3. CKD Stage IIIb - Recent creatinine baseline 2.5-2.8  - Creatinine 2.37 today. Continue to monitor.  4.  H/O RLE DVT -Continue eliquis 5 mg twice a day.  Hgb down slightly, 9.7>>8.4. Will monitor  5.  H/O RP Bleed  6. Constipation -give stool softner.  7. DNR/DNI - Discussed code status. He confirms DNR/DNI. He wants to keep ICD on. He had GOLD DNR form with him.  -Active with Hospice services provided by [facility].   8. Deconditioning - consult PT   Length of Stay: 2  Lyda Jester, PA-C  12/26/2019, 8:46 AM  Advanced Heart Failure Team Pager [phone] (M-F; 7a - 4p)  Please contact [facility] for night-coverage after hours (4p -7a ) and weekends on [email or web address]   Patient seen and examined with the above-signed Advanced Practice Provider and/or Housestaff. I personally reviewed laboratory data, imaging studies and relevant notes. I independently  examined the patient and formulated the important aspects of the plan. I have edited the note to reflect any of my changes or salient points. I have personally discussed the plan with the patient and/or family.  Remains very tenuous despite milrinone. Co-ox 54%. Massively volume overloaded with only modest response to lasix gtt and metolazone.  Creatinine up slightly.  Will continue lasix gtt and metolazone.   Recheck co-ox. If < 55% add dobutamine 2.5. Continue eliquis for DVT.   Glori Bickers, MD  12:56 PM

## 2019-12-26 NOTE — Evaluation (Signed)
Physical Therapy Evaluation Patient Details Name: Spencer Rice MRN: [id number] DOB: 1940/12/08 Today's Date: 12/26/2019   History of Present Illness  79 year old with a history of end-stage systolic HF due to NICM EF 10% s/p Biotronik CRT-D,  severe MR/TR, LBBB, CKD stage 3 s/p previous ablation of kidney C (1.8-2.4), COPD, DMII and recent RP bleed. Presented [facility] via EMS complaining shortness of breath, constipation, and thigh/scrotal swelling due to volume overload 2/2 CHF exacerbation.  Clinical Impression  Pt presents to PT with deficits in functional mobility, gait, balance, endurance, strength, power. Pt currently requires physical assistance to perform all functional mobility and has limited gait tolerance at this time due to generalized weakness. Pt will benefit from continued acute PT services to aide in a return to independent mobility.    Follow Up Recommendations CIR;Supervision/Assistance - 24 hour    Equipment Recommendations  (defer to post-acute setting)    Recommendations for Other Services Rehab consult     Precautions / Restrictions Precautions Precautions: Fall Restrictions Weight Bearing Restrictions: No      Mobility  Bed Mobility Overal bed mobility: Needs Assistance Bed Mobility: Supine to Sit     Supine to sit: Mod assist     General bed mobility comments: pt reports weakness in L arm, limiting use of extremity during session  Transfers Overall transfer level: Needs assistance Equipment used: Rolling walker (2 wheeled) Transfers: Sit to/from Stand Sit to Stand: Min assist;From elevated surface            Ambulation/Gait Ambulation/Gait assistance: Herbalist (Feet): 3 Feet Assistive device: Rolling walker (2 wheeled) Gait Pattern/deviations: Shuffle;Trunk flexed Gait velocity: reduced Gait velocity interpretation: <1.31 ft/sec, indicative of household ambulator General Gait Details: pt with short step to gait with  reduced gait speed and foot clearance bilaterally  Stairs            Wheelchair Mobility    Modified Rankin (Stroke Patients Only)       Balance Overall balance assessment: Needs assistance Sitting-balance support: Single extremity supported;Feet supported Sitting balance-Leahy Scale: Good Sitting balance - Comments: close supervision   Standing balance support: Bilateral upper extremity supported Standing balance-Leahy Scale: Fair Standing balance comment: minG with BUE support of RW                             Pertinent Vitals/Pain Pain Assessment: No/denies pain    Home Living Family/patient expects to be discharged to:: Private residence Living Arrangements: Children Available Help at Discharge: Family;Available PRN/intermittently Type of Home: House Home Access: Level entry     Home Layout: One level Home Equipment: Walker - 2 wheels;Cane - single point;Other (comment) Additional Comments: pt lives with son, wife passed within the last year    Prior Function Level of Independence: Independent with assistive device(s)         Comments: pt requiring assistance for all mobility and ADLs over the last 3 weeks but was previously independent with use of cane or RW     Hand Dominance   Dominant Hand: Right    Extremity/Trunk Assessment   Upper Extremity Assessment Upper Extremity Assessment: Generalized weakness    Lower Extremity Assessment Lower Extremity Assessment: Generalized weakness    Cervical / Trunk Assessment Cervical / Trunk Assessment: Normal  Communication   Communication: No difficulties  Cognition Arousal/Alertness: Awake/alert Behavior During Therapy: WFL for tasks assessed/performed Overall Cognitive Status: Within Functional Limits for tasks assessed  General Comments General comments (skin integrity, edema, etc.): VSS on 2L [place]    Exercises      Assessment/Plan    PT Assessment Patient needs continued PT services  PT Problem List Decreased strength;Decreased activity tolerance;Decreased balance;Decreased mobility;Decreased knowledge of use of DME;Decreased safety awareness;Decreased knowledge of precautions;Cardiopulmonary status limiting activity       PT Treatment Interventions DME instruction;Gait training;Functional mobility training;Therapeutic activities;Therapeutic exercise;Balance training;Neuromuscular re-education;Patient/family education    PT Goals (Current goals can be found in the Care Plan section)  Acute Rehab PT Goals Patient Stated Goal: To return to independent mobility PT Goal Formulation: With patient Time For Goal Achievement: 01/09/20 Potential to Achieve Goals: Good    Frequency Min 3X/week   Barriers to discharge        Co-evaluation               AM-PAC PT "6 Clicks" Mobility  Outcome Measure Help needed turning from your back to your side while in a flat bed without using bedrails?: A Little Help needed moving from lying on your back to sitting on the side of a flat bed without using bedrails?: A Lot Help needed moving to and from a bed to a chair (including a wheelchair)?: A Little Help needed standing up from a chair using your arms (e.g., wheelchair or bedside chair)?: A Little Help needed to walk in hospital room?: A Little Help needed climbing 3-5 steps with a railing? : Total 6 Click Score: 15    End of Session Equipment Utilized During Treatment: Oxygen Activity Tolerance: Patient tolerated treatment well Patient left: in chair;with call bell/phone within reach Nurse Communication: Mobility status PT Visit Diagnosis: Muscle weakness (generalized) (M62.81);Unsteadiness on feet (R26.81)    Time: 0177-9390 PT Time Calculation (min) (ACUTE ONLY): 28 min   Charges:   PT Evaluation $PT Eval Moderate Complexity: 1 Mod PT Treatments $Therapeutic Activity: 8-22 mins         Zenaida Niece, PT, DPT Acute Rehabilitation Pager: [phone]   Zenaida Niece 12/26/2019, 12:11 PM

## 2019-12-26 NOTE — Progress Notes (Addendum)
Rehab Admissions Coordinator Note:  Per PT recommendation, this patient was screened by Raechel Ache for appropriateness for an Inpatient Acute Rehab Consult.   Noted pt is end-stage HF with an EF of 10%. He is active with Home Hospice care. At this time, pt is not an appropriate candidate for CIR. AC will not pursue consult order.   Raechel Ache 12/26/2019, 12:50 PM  I can be reached at [phone].

## 2019-12-27 LAB — BASIC METABOLIC PANEL
Anion gap: 11 (ref 5–15)
BUN: 46 mg/dL — ABNORMAL HIGH (ref 8–23)
CO2: 26 mmol/L (ref 22–32)
Calcium: 9 mg/dL (ref 8.9–10.3)
Chloride: 89 mmol/L — ABNORMAL LOW (ref 98–111)
Creatinine, Ser: 2.4 mg/dL — ABNORMAL HIGH (ref 0.61–1.24)
GFR calc Af Amer: 29 mL/min — ABNORMAL LOW (ref >60)
GFR calc non Af Amer: 25 mL/min — ABNORMAL LOW (ref >60)
Glucose, Bld: 121 mg/dL — ABNORMAL HIGH (ref 70–99)
Potassium: 3.8 mmol/L (ref 3.5–5.1)
Sodium: 126 mmol/L — ABNORMAL LOW (ref 135–145)

## 2019-12-27 LAB — COOXEMETRY PANEL
Carboxyhemoglobin: 2.1 % — ABNORMAL HIGH (ref 0.5–1.5)
Carboxyhemoglobin: 2.1 % — ABNORMAL HIGH (ref 0.5–1.5)
Methemoglobin: 0.5 % (ref 0.0–1.5)
Methemoglobin: 1 % (ref 0.0–1.5)
O2 Saturation: 81.6 %
O2 Saturation: 59.3 %
Total hemoglobin: 7.3 g/dL — ABNORMAL LOW (ref 12.0–16.0)
Total hemoglobin: 8.8 g/dL — ABNORMAL LOW (ref 12.0–16.0)

## 2019-12-27 LAB — CBC
HCT: 27.6 % — ABNORMAL LOW (ref 39.0–52.0)
Hemoglobin: 8.5 g/dL — ABNORMAL LOW (ref 13.0–17.0)
MCH: 19.5 pg — ABNORMAL LOW (ref 26.0–34.0)
MCHC: 30.8 g/dL (ref 30.0–36.0)
MCV: 63.3 fL — ABNORMAL LOW (ref 80.0–100.0)
Platelets: 142 10*3/uL — ABNORMAL LOW (ref 150–400)
RBC: 4.36 MIL/uL (ref 4.22–5.81)
RDW: 23.2 % — ABNORMAL HIGH (ref 11.5–15.5)
WBC: 4.9 10*3/uL (ref 4.0–10.5)
nRBC: 0.4 % — ABNORMAL HIGH (ref 0.0–0.2)

## 2019-12-27 LAB — MAGNESIUM: Magnesium: 2.4 mg/dL (ref 1.7–2.4)

## 2019-12-27 MED ORDER — METOLAZONE 5 MG PO TABS
5.0000 mg | ORAL_TABLET | Freq: Once | ORAL | Status: AC
  Administered 2019-12-27: 5 mg via ORAL
  Filled 2019-12-27: qty 1

## 2019-12-27 MED ORDER — POTASSIUM CHLORIDE CRYS ER 20 MEQ PO TBCR
20.0000 meq | EXTENDED_RELEASE_TABLET | Freq: Once | ORAL | Status: AC
  Administered 2019-12-27: 20 meq via ORAL
  Filled 2019-12-27: qty 1

## 2019-12-27 MED ORDER — ONDANSETRON HCL 4 MG/2ML IJ SOLN
4.0000 mg | Freq: Once | INTRAMUSCULAR | Status: AC | PRN
  Administered 2019-12-27: 4 mg via INTRAVENOUS
  Filled 2019-12-27: qty 2

## 2019-12-27 NOTE — Plan of Care (Signed)

## 2019-12-27 NOTE — Progress Notes (Signed)
Advanced Heart Failure Rounding Note  PCP-Cardiologist: No primary care provider on file.   Subjective:    Dobutamine added to milrinone for persistently low co-ox yesterday. Co-ox 51%-> 59%   Remains on lasix gtt at 20. Also getting metolazone 5mg  daily. Weight down 7 pounds  Remains bloated and edematous. No CP, sob,orthopena or PND.   Creatinine stable at 2.4  Objective:   Weight Range: 90.3 kg Body mass index is 26.26 kg/m.   Vital Signs:   Temp:  [97.7 F (36.5 C)-98.1 F (36.7 C)] 97.7 F (36.5 C) (01/23 0843) Pulse Rate:  [75-84] 78 (01/23 0843) Resp:  [15-21] 18 (01/23 0843) BP: (80-94)/(51-64) 82/51 (01/23 0843) SpO2:  [98 %-100 %] 100 % (01/23 0843) Weight:  [90.3 kg] 90.3 kg (01/23 0558)    Weight change: Filed Weights   12/24/19 1824 12/26/19 0517 12/27/19 0558  Weight: 95.1 kg 93.7 kg 90.3 kg    Intake/Output:   Intake/Output Summary (Last 24 hours) at 12/27/2019 0910 Last data filed at 12/27/2019 0800 Gross per 24 hour  Intake 1027.69 ml  Output 4325 ml  Net -3297.31 ml      Physical Exam    General:  Weak appearing. No resp difficulty HEENT: normal Neck: supple. no JVP to jaw. Carotids 2+ bilat; no bruits. No lymphadenopathy or thryomegaly appreciated. Cor: PMI laterally displaced. Regular rate & rhythm. 2/6 TR + s3 Lungs: clear Abdomen: soft, nontender, nondistended. No hepatosplenomegaly. No bruits or masses. Good bowel sounds. Extremities: no cyanosis, clubbing, rash, 3+ edema + UNNA boots Neuro: alert & orientedx3, cranial nerves grossly intact. moves all 4 extremities w/o difficulty. Affect pleasant  Telemetry   SR 70-80s personally checked.   EKG   n/a   Labs    CBC Recent Labs    12/24/19 1657 12/24/19 1657 12/26/19 0233 12/27/19 0414  WBC 4.5   < > 5.0 4.9  NEUTROABS 3.4  --   --   --   HGB 9.7*   < > 8.4* 8.5*  HCT 31.7*   < > 27.0* 27.6*  MCV 65.0*   < > 64.3* 63.3*  PLT 155   < > 137* 142*   < > = values  in this interval not displayed.   Basic Metabolic Panel Recent Labs    12/26/19 0233 12/27/19 0414  NA 128* 126*  K 4.3 3.8  CL 93* 89*  CO2 23 26  GLUCOSE 112* 121*  BUN 47* 46*  CREATININE 2.37* 2.40*  CALCIUM 8.7* 9.0  MG  --  2.4   Liver Function Tests Recent Labs    12/24/19 1657  AST 38  ALT 17  ALKPHOS 104  BILITOT 3.8*  PROT 7.1  ALBUMIN 2.8*   No results for input(s): LIPASE, AMYLASE in the last 72 hours. Cardiac Enzymes No results for input(s): CKTOTAL, CKMB, CKMBINDEX, TROPONINI in the last 72 hours.  BNP: BNP (last 3 results) Recent Labs    08/21/19 2013 10/23/19 1048 12/24/19 1657  BNP 2,515.0* 3,200.7* 2,727.7*    ProBNP (last 3 results) No results for input(s): PROBNP in the last 8760 hours.   D-Dimer No results for input(s): DDIMER in the last 72 hours. Hemoglobin A1C No results for input(s): HGBA1C in the last 72 hours. Fasting Lipid Panel No results for input(s): CHOL, HDL, LDLCALC, TRIG, CHOLHDL, LDLDIRECT in the last 72 hours. Thyroid Function Tests No results for input(s): TSH, T4TOTAL, T3FREE, THYROIDAB in the last 72 hours.  Invalid input(s): FREET3  Other  results:   Imaging    No results found.   Medications:     Scheduled Medications: . amiodarone  200 mg Oral Daily  . apixaban  5 mg Oral BID  . Chlorhexidine Gluconate Cloth  6 each Topical Daily  . docusate sodium  100 mg Oral Daily  . lactobacillus  1 g Oral TID WC  . metolazone  5 mg Oral Daily  . pantoprazole  40 mg Oral Daily  . polyethylene glycol  17 g Oral Daily  . sodium chloride flush  10-40 mL Intracatheter Q12H  . sucralfate  1 g Oral TID WC & HS  . tamsulosin  0.4 mg Oral QPC supper    Infusions: . DOBUTamine 2.5 mcg/kg/min (12/27/19 0400)  . furosemide (LASIX) infusion 20 mg/hr (12/27/19 0553)  . milrinone 0.5 mcg/kg/min (12/27/19 0551)    PRN Medications: acetaminophen, sodium chloride flush     Assessment/Plan   1. 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 [place] 2015 with moderate disease. Has Biotronik BiV ICD. - ECHO 10/22/19 EF severely EF 5-10% with severe MR/TR, biatrial enlargement, RV with moderately reduced function - On milrinone 0.5 mcg. Dobutamine 2.5 added 1/22 due to persistently low co-ox. Co-ox improved to 59% - Still markedly volume overloaded. Creatine elevated but stable.  - Continue lasix gtt and daily metolazone  - No bb with low output. No Arb/spiro/dig with elevated creatinine.  - continue w/ unna boots.   2. Urinary Retention  -He had foley catheter until last week.  - Urine output adequate. On flomax.   3. CKD Stage IIIb - Recent creatinine baseline 2.5-2.8  - Creatinine 2.40 today. Continue to monitor.   4.  H/O RLE DVT -Continue eliquis 5 mg twice a day.  Hgb down slightly, 9.7>>8.4 > 8.5 Will monitor   5. Hyponatremia - Na 126 - may need tolvaptan  5. DNR/DNI - Discussed code status. He confirms DNR/DNI. He wants to keep ICD on. He had GOLD DNR form with him.  -Active with Hospice services provided by [facility].     Length of Stay: 3  Glori Bickers, MD  12/27/2019, 9:10 AM  Advanced Heart Failure Team Pager [phone] (M-F; [place])  Please contact [facility] for night-coverage after hours (4p -7a ) and weekends on [email or web address]

## 2019-12-28 LAB — CBC
HCT: 27.7 % — ABNORMAL LOW (ref 39.0–52.0)
Hemoglobin: 8.5 g/dL — ABNORMAL LOW (ref 13.0–17.0)
MCH: 19.5 pg — ABNORMAL LOW (ref 26.0–34.0)
MCHC: 30.7 g/dL (ref 30.0–36.0)
MCV: 63.5 fL — ABNORMAL LOW (ref 80.0–100.0)
Platelets: 155 10*3/uL (ref 150–400)
RBC: 4.36 MIL/uL (ref 4.22–5.81)
RDW: 23 % — ABNORMAL HIGH (ref 11.5–15.5)
WBC: 5 10*3/uL (ref 4.0–10.5)
nRBC: 0.4 % — ABNORMAL HIGH (ref 0.0–0.2)

## 2019-12-28 LAB — BASIC METABOLIC PANEL
Anion gap: 13 (ref 5–15)
BUN: 46 mg/dL — ABNORMAL HIGH (ref 8–23)
CO2: 29 mmol/L (ref 22–32)
Calcium: 8.7 mg/dL — ABNORMAL LOW (ref 8.9–10.3)
Chloride: 85 mmol/L — ABNORMAL LOW (ref 98–111)
Creatinine, Ser: 2.33 mg/dL — ABNORMAL HIGH (ref 0.61–1.24)
GFR calc Af Amer: 30 mL/min — ABNORMAL LOW (ref >60)
GFR calc non Af Amer: 26 mL/min — ABNORMAL LOW (ref >60)
Glucose, Bld: 108 mg/dL — ABNORMAL HIGH (ref 70–99)
Potassium: 3.7 mmol/L (ref 3.5–5.1)
Sodium: 127 mmol/L — ABNORMAL LOW (ref 135–145)

## 2019-12-28 LAB — COOXEMETRY PANEL
Carboxyhemoglobin: 2.2 % — ABNORMAL HIGH (ref 0.5–1.5)
Carboxyhemoglobin: 1.7 % — ABNORMAL HIGH (ref 0.5–1.5)
Methemoglobin: 1 % (ref 0.0–1.5)
Methemoglobin: 0.7 % (ref 0.0–1.5)
O2 Saturation: 73.5 %
O2 Saturation: 54.4 %
Total hemoglobin: 10 g/dL — ABNORMAL LOW (ref 12.0–16.0)
Total hemoglobin: 8.6 g/dL — ABNORMAL LOW (ref 12.0–16.0)

## 2019-12-28 MED ORDER — MAGNESIUM HYDROXIDE 400 MG/5ML PO SUSP: 30.0000 mL | Freq: Every day | ORAL | Status: DC | PRN

## 2019-12-28 MED ORDER — GUAIFENESIN-DM 100-10 MG/5ML PO SYRP: 15.0000 mL | ORAL_SOLUTION | ORAL | Status: DC | PRN

## 2019-12-28 MED ORDER — ALUM & MAG HYDROXIDE-SIMETH 200-200-20 MG/5ML PO SUSP
30.0000 mL | ORAL | Status: DC | PRN
  Administered 2019-12-30: 30 mL via ORAL
  Filled 2019-12-28: qty 30

## 2019-12-28 MED ORDER — LOPERAMIDE HCL 2 MG PO CAPS: 2.0000 mg | ORAL_CAPSULE | ORAL | Status: DC | PRN

## 2019-12-28 MED ORDER — CALCIUM CARBONATE ANTACID 500 MG PO CHEW
1.0000 | CHEWABLE_TABLET | ORAL | Status: DC | PRN
  Administered 2019-12-29: 200 mg via ORAL
  Filled 2019-12-28 (×2): qty 1

## 2019-12-28 MED ORDER — POTASSIUM CHLORIDE CRYS ER 20 MEQ PO TBCR
40.0000 meq | EXTENDED_RELEASE_TABLET | Freq: Once | ORAL | Status: AC
  Administered 2019-12-28: 40 meq via ORAL
  Filled 2019-12-28: qty 2

## 2019-12-28 MED ORDER — HYDROCORTISONE 1 % EX CREA: 1.0000 "application " | TOPICAL_CREAM | Freq: Three times a day (TID) | CUTANEOUS | Status: DC | PRN

## 2019-12-28 NOTE — Progress Notes (Signed)
Advanced Heart Failure Rounding Note  PCP-Cardiologist: No primary care provider on file.   Subjective:    Dobutamine added to milrinone for persistently low output. Co-ox 51%-> 59 -> 54%%   Remains on lasix gtt at 20. Also getting metolazone 5mg  daily.   Diuresis now markedly improved. Weight down 6 pounds overnight. Had nausea overnight. Now resolved. Denies orthopnea, CP or PND. + weak   Creatinine stable at 2.3-2.4  Objective:   Weight Range: 87.6 kg Body mass index is 25.48 kg/m.   Vital Signs:   Temp:  [97.7 F (36.5 C)-98.7 F (37.1 C)] 98.2 F (36.8 C) (01/24 0820) Pulse Rate:  [76-86] 81 (01/24 0820) Resp:  [14-22] 18 (01/24 0820) BP: (87-100)/(55-68) 91/59 (01/24 0820) SpO2:  [98 %-100 %] 99 % (01/24 0820) Weight:  [87.6 kg] 87.6 kg (01/24 0208)    Weight change: Filed Weights   12/26/19 0517 12/27/19 0558 12/28/19 0208  Weight: 93.7 kg 90.3 kg 87.6 kg    Intake/Output:   Intake/Output Summary (Last 24 hours) at 12/28/2019 1000 Last data filed at 12/28/2019 9622 Gross per 24 hour  Intake 1595.37 ml  Output 4100 ml  Net -2504.63 ml      Physical Exam    General:  Weak appearing. No resp difficulty HEENT: normal Neck: supple. JVP to jaw. Carotids 2+ bilat; no bruits. No lymphadenopathy or thryomegaly appreciated. Cor: PMI nondisplaced. Regular rate & rhythm. + s3. 2/6 MR & TR Lungs: clear Abdomen: soft, nontender, nondistended. No hepatosplenomegaly. No bruits or masses. Good bowel sounds. Extremities: no cyanosis, clubbing, rash, 3+ edema + UNNA boots Neuro: alert & orientedx3, cranial nerves grossly intact. moves all 4 extremities w/o difficulty. Affect pleasant   Telemetry   SR 80s personally checked.   EKG   n/a   Labs    CBC Recent Labs    12/27/19 0414 12/28/19 0403  WBC 4.9 5.0  HGB 8.5* 8.5*  HCT 27.6* 27.7*  MCV 63.3* 63.5*  PLT 142* 297   Basic Metabolic Panel Recent Labs    12/27/19 0414 12/28/19 0403  NA  126* 127*  K 3.8 3.7  CL 89* 85*  CO2 26 29  GLUCOSE 121* 108*  BUN 46* 46*  CREATININE 2.40* 2.33*  CALCIUM 9.0 8.7*  MG 2.4  --    Liver Function Tests No results for input(s): AST, ALT, ALKPHOS, BILITOT, PROT, ALBUMIN in the last 72 hours. No results for input(s): LIPASE, AMYLASE in the last 72 hours. Cardiac Enzymes No results for input(s): CKTOTAL, CKMB, CKMBINDEX, TROPONINI in the last 72 hours.  BNP: BNP (last 3 results) Recent Labs    08/21/19 2013 10/23/19 1048 12/24/19 1657  BNP 2,515.0* 3,200.7* 2,727.7*    ProBNP (last 3 results) No results for input(s): PROBNP in the last 8760 hours.   D-Dimer No results for input(s): DDIMER in the last 72 hours. Hemoglobin A1C No results for input(s): HGBA1C in the last 72 hours. Fasting Lipid Panel No results for input(s): CHOL, HDL, LDLCALC, TRIG, CHOLHDL, LDLDIRECT in the last 72 hours. Thyroid Function Tests No results for input(s): TSH, T4TOTAL, T3FREE, THYROIDAB in the last 72 hours.  Invalid input(s): FREET3  Other results:   Imaging    No results found.   Medications:     Scheduled Medications: . amiodarone  200 mg Oral Daily  . apixaban  5 mg Oral BID  . Chlorhexidine Gluconate Cloth  6 each Topical Daily  . docusate sodium  100 mg Oral Daily  .  lactobacillus  1 g Oral TID WC  . metolazone  5 mg Oral Daily  . pantoprazole  40 mg Oral Daily  . polyethylene glycol  17 g Oral Daily  . sodium chloride flush  10-40 mL Intracatheter Q12H  . sucralfate  1 g Oral TID WC & HS  . tamsulosin  0.4 mg Oral QPC supper    Infusions: . DOBUTamine 2.5 mcg/kg/min (12/28/19 0400)  . furosemide (LASIX) infusion 20 mg/hr (12/28/19 0819)  . milrinone 0.5 mcg/kg/min (12/28/19 0959)    PRN Medications: acetaminophen, sodium chloride flush     Assessment/Plan   1. 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 [place] 2015 with moderate disease. Has Biotronik BiV ICD. - ECHO 10/22/19 EF severely EF 5-10% with severe MR/TR, biatrial enlargement, RV with moderately reduced function - On milrinone 0.5 mcg. Dobutamine 2.5 added 1/22 due to persistently low co-ox. Co-ox now 54%  - Still markedly volume overloaded but now finally starting to respond to high-dose diuretics. Urine clearing. - Continue lasix gtt and daily metolazone  - No bb with low output. No Arb/spiro/dig with elevated creatinine.  - continue w/ unna boots.   2. Urinary Retention  -He had foley catheter until last week.  - Urine output adequate. On flomax.   3. CKD Stage IIIb - Recent creatinine baseline 2.5-2.8  - Creatinine 2.33 today. Continue to monitor.   4.  H/O RLE DVT -Continue eliquis 5 mg twice a day.  - hgb stable at 8.5  5. Hyponatremia - Na 126 -> 127 - Daily BMETs - may need tolvaptan  5. DNR/DNI - Discussed code status. He confirms DNR/DNI. He wants to keep ICD on. He had GOLD DNR form with him.  -Active with Hospice services provided by [facility].     Length of Stay: [place], MD  12/28/2019, 10:00 AM  Advanced Heart Failure Team Pager [phone] (M-F; 7a - 4p)  Please contact [facility] for night-coverage after hours (4p -7a ) and weekends on [email or web address]

## 2019-12-28 NOTE — Evaluation (Signed)
Occupational Therapy Evaluation Patient Details Name: Spencer Rice MRN: [id number] DOB: [birth year] Today's Date: 12/28/2019    History of Present Illness 79 year old with a history of end-stage systolic HF due to NICM EF 10% s/p Biotronik CRT-D,  severe MR/TR, LBBB, CKD stage 3 s/p previous ablation of kidney C (1.8-2.4), COPD, DMII and recent RP bleed. Presented to [facility] via EMS complaining shortness of breath, constipation, and thigh/scrotal swelling due to volume overload 2/2 CHF exacerbation.   Clinical Impression   PTA, pt was living with his son and was independent until recently where he has needed increased assistance with ADLs and mobility. Pt currently requiring Min A for UB ADLs, Max A for LB ADLs, and Min A for functional mobility with RW. Pt presenting with decreased balance, strength, and activity tolerance. Pt highly motivated to participate in therapy. VSS on 2L. Pt would benefit from further acute OT to facilitate safe dc. Recommend dc to home with HHOT for further OT to optimize safety, independence with ADLs, and return to PLOF.       Follow Up Recommendations  Home health OT;Supervision/Assistance - 24 hour;SNF(Pending hospice plan. )    Equipment Recommendations  None recommended by OT    Recommendations for Other Services PT consult     Precautions / Restrictions Precautions Precautions: Fall Restrictions Weight Bearing Restrictions: No      Mobility Bed Mobility Overal bed mobility: Needs Assistance Bed Mobility: Supine to Sit     Supine to sit: Mod assist     General bed mobility comments: Mod A to pull into upright posture  Transfers Overall transfer level: Needs assistance Equipment used: Rolling walker (2 wheeled) Transfers: Sit to/from Stand Sit to Stand: Min assist         General transfer comment: Min A to power up into standing    Balance Overall balance assessment: Needs assistance Sitting-balance support: Single extremity  supported;Feet supported Sitting balance-Leahy Scale: Good Sitting balance - Comments: close supervision   Standing balance support: Bilateral upper extremity supported Standing balance-Leahy Scale: Fair                             ADL either performed or assessed with clinical judgement   ADL Overall ADL's : Needs assistance/impaired Eating/Feeding: Set up;Sitting   Grooming: Minimal assistance;Oral care;Standing Grooming Details (indicate cue type and reason): Min A for slight posterior lean without UE support. When pt has UE support, he is able to maintain standing wiht Min Guard A. Upper Body Bathing: Minimal assistance;Sitting   Lower Body Bathing: Maximal assistance;Sit to/from stand   Upper Body Dressing : Minimal assistance;Sitting Upper Body Dressing Details (indicate cue type and reason): Min A for donning new gown Lower Body Dressing: Maximal assistance;Sit to/from stand Lower Body Dressing Details (indicate cue type and reason): Max A to don socks and depends.  Toilet Transfer: Minimal assistance;Ambulation;RW(simulated to recliner)           Functional mobility during ADLs: Minimal assistance;Min guard;Rolling walker General ADL Comments: Pt presenting with decreased balance, strength, and activity tolerance. Pt very motivated to participate in therapy. pt performing dressing at EOB, grooming at sink, and fucntional mobiltiy in room     Vision         Perception     Praxis      Pertinent Vitals/Pain Pain Assessment: No/denies pain     Hand Dominance Right   Extremity/Trunk Assessment Upper Extremity Assessment Upper Extremity Assessment:  Generalized weakness   Lower Extremity Assessment Lower Extremity Assessment: Defer to PT evaluation   Cervical / Trunk Assessment Cervical / Trunk Assessment: Normal   Communication Communication Communication: No difficulties   Cognition Arousal/Alertness: Awake/alert Behavior During Therapy: WFL  for tasks assessed/performed Overall Cognitive Status: Within Functional Limits for tasks assessed                                 General Comments: Very motivated to participate in therapy   General Comments  SpO2 maintained >90% on 2L O2.     Exercises     Shoulder Instructions      Home Living Family/patient expects to be discharged to:: Private residence Living Arrangements: Children Available Help at Discharge: Family;Available PRN/intermittently Type of Home: House Home Access: Level entry     Home Layout: One level     Bathroom Shower/Tub: Teacher, early years/pre: Standard     Home Equipment: Environmental consultant - 2 wheels;Cane - single point;Other (comment)   Additional Comments: 2L home O2      Prior Functioning/Environment Level of Independence: Independent with assistive device(s)        Comments: pt requiring assistance for all mobility and ADLs over the last 3 weeks but was previously independent with use of cane or RW        OT Problem List: Decreased strength;Decreased range of motion;Decreased activity tolerance;Impaired balance (sitting and/or standing);Decreased knowledge of use of DME or AE;Decreased knowledge of precautions;Cardiopulmonary status limiting activity;Increased edema      OT Treatment/Interventions: Self-care/ADL training;Therapeutic exercise;Energy conservation;DME and/or AE instruction;Therapeutic activities;Patient/family education    OT Goals(Current goals can be found in the care plan section) Acute Rehab OT Goals Patient Stated Goal: To return to independent mobility OT Goal Formulation: With patient Time For Goal Achievement: 01/11/20 Potential to Achieve Goals: Good  OT Frequency: Min 2X/week   Barriers to D/C:            Co-evaluation              AM-PAC OT "6 Clicks" Daily Activity     Outcome Measure Help from another person eating meals?: None Help from another person taking care of personal  grooming?: A Little Help from another person toileting, which includes using toliet, bedpan, or urinal?: A Little Help from another person bathing (including washing, rinsing, drying)?: A Lot Help from another person to put on and taking off regular upper body clothing?: A Little Help from another person to put on and taking off regular lower body clothing?: A Lot 6 Click Score: 17   End of Session Equipment Utilized During Treatment: Rolling walker;Oxygen;Gait belt(2L) Nurse Communication: Mobility status  Activity Tolerance: Patient tolerated treatment well Patient left: in chair;with call bell/phone within reach;with chair alarm set  OT Visit Diagnosis: Unsteadiness on feet (R26.81);Other abnormalities of gait and mobility (R26.89);Muscle weakness (generalized) (M62.81)                Time: 3329-5188 OT Time Calculation (min): 47 min Charges:  OT General Charges $OT Visit: 1 Visit OT Evaluation $OT Eval Moderate Complexity: 1 Mod OT Treatments $Self Care/Home Management : 23-37 mins  [place], OTR/L Acute Rehab Pager: [phone] Office: [place] 12/28/2019, 5:55 PM

## 2019-12-29 ENCOUNTER — Other Ambulatory Visit (HOSPITAL_COMMUNITY): Payer: Self-pay | Admitting: Internal Medicine

## 2019-12-29 LAB — BASIC METABOLIC PANEL
Anion gap: 14 (ref 5–15)
BUN: 45 mg/dL — ABNORMAL HIGH (ref 8–23)
CO2: 32 mmol/L (ref 22–32)
Calcium: 9 mg/dL (ref 8.9–10.3)
Chloride: 81 mmol/L — ABNORMAL LOW (ref 98–111)
Creatinine, Ser: 2.45 mg/dL — ABNORMAL HIGH (ref 0.61–1.24)
GFR calc Af Amer: 28 mL/min — ABNORMAL LOW (ref >60)
GFR calc non Af Amer: 24 mL/min — ABNORMAL LOW (ref >60)
Glucose, Bld: 107 mg/dL — ABNORMAL HIGH (ref 70–99)
Potassium: 3.7 mmol/L (ref 3.5–5.1)
Sodium: 127 mmol/L — ABNORMAL LOW (ref 135–145)

## 2019-12-29 LAB — CBC
HCT: 29.9 % — ABNORMAL LOW (ref 39.0–52.0)
Hemoglobin: 9.3 g/dL — ABNORMAL LOW (ref 13.0–17.0)
MCH: 20 pg — ABNORMAL LOW (ref 26.0–34.0)
MCHC: 31.1 g/dL (ref 30.0–36.0)
MCV: 64.2 fL — ABNORMAL LOW (ref 80.0–100.0)
Platelets: 173 10*3/uL (ref 150–400)
RBC: 4.66 MIL/uL (ref 4.22–5.81)
RDW: 23.5 % — ABNORMAL HIGH (ref 11.5–15.5)
WBC: 5.5 10*3/uL (ref 4.0–10.5)
nRBC: 0.4 % — ABNORMAL HIGH (ref 0.0–0.2)

## 2019-12-29 LAB — COOXEMETRY PANEL
Carboxyhemoglobin: 1.6 % — ABNORMAL HIGH (ref 0.5–1.5)
Methemoglobin: 0.7 % (ref 0.0–1.5)
O2 Saturation: 57.7 %
Total hemoglobin: 9.3 g/dL — ABNORMAL LOW (ref 12.0–16.0)

## 2019-12-29 MED ORDER — POTASSIUM CHLORIDE CRYS ER 20 MEQ PO TBCR
40.0000 meq | EXTENDED_RELEASE_TABLET | Freq: Once | ORAL | Status: AC
  Administered 2019-12-29: 40 meq via ORAL
  Filled 2019-12-29: qty 2

## 2019-12-29 NOTE — Progress Notes (Addendum)
Advanced Heart Failure Rounding Note  PCP-Cardiologist: No primary care provider on file.   Subjective:    Dobutamine added to milrinone for persistently low output. Co-ox 58%   Remains on lasix gtt at 20. Also getting metolazone 5mg  daily. Brisk diuresis noted. Weight trending down another 12 pounds.   Feeling a little better. Denies SOB.    Objective:   Weight Range: 82.4 kg Body mass index is 23.97 kg/m.   Vital Signs:   Temp:  [98.1 F (36.7 C)-98.3 F (36.8 C)] 98.2 F (36.8 C) (01/25 0309) Pulse Rate:  [79-83] 80 (01/25 0317) Resp:  [16-22] 17 (01/25 0317) BP: (85-99)/(54-65) 89/65 (01/25 0317) SpO2:  [98 %-100 %] 98 % (01/25 0317) Weight:  [82.4 kg] 82.4 kg (01/25 0309) Last BM Date: 12/28/19  Weight change: Filed Weights   12/27/19 0558 12/28/19 0208 12/29/19 0309  Weight: 90.3 kg 87.6 kg 82.4 kg    Intake/Output:   Intake/Output Summary (Last 24 hours) at 12/29/2019 0804 Last data filed at 12/29/2019 0500 Gross per 24 hour  Intake 1607.8 ml  Output 6250 ml  Net -4642.2 ml      Physical Exam   CVP 9 in the chair.  General:  In the chair.  No resp difficulty HEENT: normal Neck: supple. JVP 9-10 . Carotids 2+ bilat; no bruits. No lymphadenopathy or thryomegaly appreciated. Cor: PMI laterally displaced. Regular rate & rhythm. No rubs, or murmurs. +s3 .2/6 TR R upper chest tunneled cather Lungs: clear Abdomen: soft, nontender, nondistended. No hepatosplenomegaly. No bruits or masses. Good bowel sounds. Extremities: no cyanosis, clubbing, rash, R and LLE unna boots. 1-2+ edema into thighs Neuro: alert & oriented x 3, cranial nerves grossly intact. moves all 4 extremities w/o difficulty. Affect pleasant   Telemetry   Sinus V paced 80s   EKG   n/a   Labs    CBC Recent Labs    12/28/19 0403 12/29/19 0344  WBC 5.0 5.5  HGB 8.5* 9.3*  HCT 27.7* 29.9*  MCV 63.5* 64.2*  PLT 155 341   Basic Metabolic Panel Recent Labs    12/27/19 0414  12/27/19 0414 12/28/19 0403 12/29/19 0344  NA 126*   < > 127* 127*  K 3.8   < > 3.7 3.7  CL 89*   < > 85* 81*  CO2 26   < > 29 32  GLUCOSE 121*   < > 108* 107*  BUN 46*   < > 46* 45*  CREATININE 2.40*   < > 2.33* 2.45*  CALCIUM 9.0   < > 8.7* 9.0  MG 2.4  --   --   --    < > = values in this interval not displayed.   Liver Function Tests No results for input(s): AST, ALT, ALKPHOS, BILITOT, PROT, ALBUMIN in the last 72 hours. No results for input(s): LIPASE, AMYLASE in the last 72 hours. Cardiac Enzymes No results for input(s): CKTOTAL, CKMB, CKMBINDEX, TROPONINI in the last 72 hours.  BNP: BNP (last 3 results) Recent Labs    08/21/19 2013 10/23/19 1048 12/24/19 1657  BNP 2,515.0* 3,200.7* 2,727.7*    ProBNP (last 3 results) No results for input(s): PROBNP in the last 8760 hours.   D-Dimer No results for input(s): DDIMER in the last 72 hours. Hemoglobin A1C No results for input(s): HGBA1C in the last 72 hours. Fasting Lipid Panel No results for input(s): CHOL, HDL, LDLCALC, TRIG, CHOLHDL, LDLDIRECT in the last 72 hours. Thyroid Function Tests No results  for input(s): TSH, T4TOTAL, T3FREE, THYROIDAB in the last 72 hours.  Invalid input(s): FREET3  Other results:   Imaging    No results found.   Medications:     Scheduled Medications: . amiodarone  200 mg Oral Daily  . apixaban  5 mg Oral BID  . Chlorhexidine Gluconate Cloth  6 each Topical Daily  . docusate sodium  100 mg Oral Daily  . lactobacillus  1 g Oral TID WC  . metolazone  5 mg Oral Daily  . pantoprazole  40 mg Oral Daily  . polyethylene glycol  17 g Oral Daily  . sodium chloride flush  10-40 mL Intracatheter Q12H  . sucralfate  1 g Oral TID WC & HS  . tamsulosin  0.4 mg Oral QPC supper    Infusions: . DOBUTamine 2.5 mcg/kg/min (12/29/19 0743)  . furosemide (LASIX) infusion 20 mg/hr (12/29/19 0400)  . milrinone 0.5 mcg/kg/min (12/29/19 0740)    PRN Medications: acetaminophen, alum &  mag hydroxide-simeth, calcium carbonate, guaiFENesin-dextromethorphan, hydrocortisone cream, loperamide, magnesium hydroxide, sodium chloride flush     Assessment/Plan   1. 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 [place] 2015 with moderate disease. Has Biotronik BiV ICD. - ECHO 10/22/19 EF severely EF 5-10% with severe MR/TR, biatrial enlargement, RV with moderately reduced function - On milrinone 0.5 mcg. Dobutamine 2.5 added 1/22 due to persistently low co-ox. Co-ox stable 58%. Cut  Dobutamine back once diuresed.  - Continue lasix gtt and daily metolazone . Renal function trending up. Hopefully we can cut back diuretics tomorrow.  - No bb with low output. No Arb/spiro/dig with elevated creatinine.  - continue w/ unna boots.   2. Urinary Retention  -He had foley catheter until last week.  - Urine output adequate. On flomax.   3. CKD Stage IIIb - Recent creatinine baseline 2.5-2.8  - Creatinine trending up 2.3>2.45 .   4.  H/O RLE DVT -Continue eliquis 5 mg twice a day.  -Hgb stable 9.3   5. Hyponatremia - Na 126 -> 127->127  - may need tolvaptan  5. DNR/DNI - Discussed code status. He confirms DNR/DNI. He wants to keep ICD on. He had GOLD DNR form with him.  -Active with Hospice services provided by [facility].    Length of Stay: 5  Amy Clegg, NP  12/29/2019, 8:04 AM  Advanced Heart Failure Team Pager [phone] (M-F; 7a - 4p)  Please contact [facility] for night-coverage after hours (4p -7a ) and weekends on [email or web address]  Patient seen and examined with the above-signed Advanced Practice Provider and/or Housestaff. I personally reviewed laboratory data, imaging studies and relevant notes. I independently examined the patient and formulated the important aspects of the plan. I have edited the note to reflect any of my changes or salient points. I have personally discussed the  plan with the patient and/or family.  Now down 28 pounds on dual inotropes and high-dose IV lasix and metolazone.  Co-ox marginal. Creatinine up slightly. Would continue IV diuresis and dual inotropes at least one more day. Hgb stable on eliquis.   Glori Bickers, MD  8:40 AM

## 2019-12-29 NOTE — Plan of Care (Signed)

## 2019-12-29 NOTE — Progress Notes (Signed)
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 Presented to [facility] via EMS complaining shortness of breath, constipation, and thigh/scrotal swelling due to volume overload 2/2 CHF exacerbation.    PT Comments    Patient continues to make progress toward PT goals and tolerated increased gait distance. Noted pt is active with home hospice and d/c plan is to return with hospice care and assistance from son. PT will continue to follow acutely and progress as tolerated.     Follow Up Recommendations  Supervision/Assistance - 24 hour; Per CM/SW home with hospice care     Equipment Recommendations  None recommended by PT    Recommendations for Other Services       Precautions / Restrictions Precautions Precautions: Fall Restrictions Weight Bearing Restrictions: No    Mobility  Bed Mobility               General bed mobility comments: pt OOB in chair upon arrival  Transfers Overall transfer level: Needs assistance Equipment used: Rolling walker (2 wheeled) Transfers: Sit to/from Stand Sit to Stand: Min assist         General transfer comment: assist to power up into standing with cues for hand placement  Ambulation/Gait Ambulation/Gait assistance: Min guard;Min assist Gait Distance (Feet): 70 Feet Assistive device: Rolling walker (2 wheeled) Gait Pattern/deviations: Step-to pattern;Step-through pattern;Decreased stride length;Decreased dorsiflexion - right;Trunk flexed Gait velocity: decreased   General Gait Details: pt with flexed posture and bilat LE; when fatigued began shuffling gait with poor R foot clearance in particular; cues for upright posture     Stairs             Wheelchair Mobility    Modified Rankin (Stroke Patients Only)       Balance Overall balance assessment: Needs assistance Sitting-balance support: Single extremity supported;Feet supported Sitting balance-Leahy Scale: Good     Standing balance support: Bilateral upper extremity supported Standing balance-Leahy Scale: Poor                              Cognition Arousal/Alertness: Awake/alert Behavior During Therapy: WFL for tasks assessed/performed Overall Cognitive Status: Within Functional Limits for tasks assessed                                 General Comments: Very motivated to participate in therapy      Exercises      General Comments General comments (skin integrity, edema, etc.): VSS on 2L O2 via [place]      Pertinent Vitals/Pain Pain Assessment: No/denies pain    Home Living                      Prior Function            PT Goals (current goals can now be found in the care plan section) Acute Rehab PT Goals Patient Stated Goal: To return to independent mobility Progress towards PT goals: Progressing toward goals    Frequency    Min 3X/week      PT Plan Discharge plan needs to be updated  Co-evaluation              AM-PAC PT "6 Clicks" Mobility   Outcome Measure  Help needed turning from your back to your side while in a flat bed without using bedrails?: A Little Help needed moving from lying on your back to sitting on the side of a flat bed without using bedrails?: A Little Help needed moving to and from a bed to a chair (including a wheelchair)?: A Little Help needed standing up from a chair using your arms (e.g., wheelchair or bedside chair)?: A Little Help needed to walk in hospital room?: A Little Help needed climbing 3-5 steps with a railing? : A Lot 6 Click Score: 17    End of Session Equipment Utilized During Treatment: Oxygen;Gait belt Activity Tolerance:  Patient tolerated treatment well Patient left: in chair;with call bell/phone within reach Nurse Communication: Mobility status PT Visit Diagnosis: Muscle weakness (generalized) (M62.81);Unsteadiness on feet (R26.81)     Time: 0721-8288 PT Time Calculation (min) (ACUTE ONLY): 28 min  Charges:  $Gait Training: 23-37 mins                     Earney Navy, PTA Acute Rehabilitation Services Pager: [phone] Office: [phone]     Darliss Cheney 12/29/2019, 3:50 PM

## 2019-12-30 LAB — BASIC METABOLIC PANEL
Anion gap: 12 (ref 5–15)
BUN: 44 mg/dL — ABNORMAL HIGH (ref 8–23)
CO2: 37 mmol/L — ABNORMAL HIGH (ref 22–32)
Calcium: 9 mg/dL (ref 8.9–10.3)
Chloride: 78 mmol/L — ABNORMAL LOW (ref 98–111)
Creatinine, Ser: 2.21 mg/dL — ABNORMAL HIGH (ref 0.61–1.24)
GFR calc Af Amer: 32 mL/min — ABNORMAL LOW (ref >60)
GFR calc non Af Amer: 28 mL/min — ABNORMAL LOW (ref >60)
Glucose, Bld: 116 mg/dL — ABNORMAL HIGH (ref 70–99)
Potassium: 3 mmol/L — ABNORMAL LOW (ref 3.5–5.1)
Sodium: 127 mmol/L — ABNORMAL LOW (ref 135–145)

## 2019-12-30 LAB — COOXEMETRY PANEL
Carboxyhemoglobin: 1.9 % — ABNORMAL HIGH (ref 0.5–1.5)
Methemoglobin: 0.6 % (ref 0.0–1.5)
O2 Saturation: 60.5 %
Total hemoglobin: 9.3 g/dL — ABNORMAL LOW (ref 12.0–16.0)

## 2019-12-30 MED ORDER — TORSEMIDE 20 MG PO TABS
60.0000 mg | ORAL_TABLET | Freq: Two times a day (BID) | ORAL | Status: DC
  Administered 2019-12-30 – 2019-12-31 (×2): 60 mg via ORAL
  Filled 2019-12-30 (×2): qty 3

## 2019-12-30 MED ORDER — POTASSIUM CHLORIDE CRYS ER 20 MEQ PO TBCR
40.0000 meq | EXTENDED_RELEASE_TABLET | Freq: Four times a day (QID) | ORAL | Status: AC
  Administered 2019-12-30 (×2): 40 meq via ORAL
  Filled 2019-12-30 (×2): qty 2

## 2019-12-30 NOTE — Progress Notes (Addendum)
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 Presented to [facility] via EMS complaining shortness of breath, constipation, and thigh/scrotal swelling due to volume overload 2/2 CHF exacerbation.   OT comments  Pt progressing towards established OT goals. Pt performing functional mobility in hallway with Min guard A and RW. VSS on 2L. Pt presenting with decreased ST memory this session. Continue to recommend dc to home with HHOT and will continue to follow acutely as admitted.    Follow Up Recommendations  Home health OT;Supervision/Assistance - 24 hour(Hospice)    Equipment Recommendations  None recommended by OT    Recommendations for Other Services PT consult    Precautions / Restrictions Precautions Precautions: Fall Restrictions Weight Bearing Restrictions: No       Mobility Bed Mobility               General bed mobility comments: pt OOB in chair upon arrival  Transfers Overall transfer level: Needs assistance Equipment used: Rolling walker (2 wheeled) Transfers: Sit to/from Stand Sit to Stand: Min guard         General transfer comment: min guard for safety; no physical assist required to stand this session    Balance Overall balance assessment: Needs assistance Sitting-balance support: Single extremity supported;Feet supported Sitting balance-Leahy Scale: Good     Standing balance support: Bilateral upper extremity supported Standing balance-Leahy Scale: Poor                             ADL either performed or assessed with clinical judgement   ADL Overall ADL's : Needs assistance/impaired                     Lower Body  Dressing: Min guard;Sit to/from stand Lower Body Dressing Details (indicate cue type and reason): Pt bending forward to adjsut socks while sitting at recliner Toilet Transfer: Min guard;Ambulation;RW(Simulated to recliner) [employer] forces technical officer Details (indicate cue type and reason): Min guard A for safety         Functional mobility during ADLs: Min guard;Rolling walker(fucntional mobility in hallway) General ADL Comments: Pt agreeable to therapy and performing functional mobility in hallway with Min guard A and RW.      Vision       Perception     Praxis      Cognition Arousal/Alertness: Awake/alert Behavior During Therapy: WFL for tasks assessed/performed Overall Cognitive Status: Within Functional Limits for tasks assessed                                 General Comments: Very motivated to participate in therapy. Noting pt with decreased ST memory. Unsure if this is baseline.        Exercises     Shoulder Instructions       General Comments VSS throughout on 2L    Pertinent Vitals/ Pain       Pain Assessment: No/denies pain  Home Living  Prior Functioning/Environment              Frequency  Min 2X/week        Progress Toward Goals  OT Goals(current goals can now be found in the care plan section)  Progress towards OT goals: Progressing toward goals  Acute Rehab OT Goals Patient Stated Goal: To return to independent mobility OT Goal Formulation: With patient Time For Goal Achievement: 01/11/20 Potential to Achieve Goals: Good ADL Goals Pt Will Perform Grooming: with modified independence;standing Pt Will Perform Lower Body Dressing: with modified independence;with adaptive equipment;sit to/from stand Pt Will Transfer to Toilet: with modified independence;ambulating;bedside commode Pt Will Perform Toileting - Clothing Manipulation and hygiene: with modified independence;sit  to/from stand;sitting/lateral leans  Plan Discharge plan remains appropriate    Co-evaluation                 AM-PAC OT "6 Clicks" Daily Activity     Outcome Measure   Help from another person eating meals?: None Help from another person taking care of personal grooming?: A Little Help from another person toileting, which includes using toliet, bedpan, or urinal?: A Little Help from another person bathing (including washing, rinsing, drying)?: A Lot Help from another person to put on and taking off regular upper body clothing?: A Little Help from another person to put on and taking off regular lower body clothing?: A Lot 6 Click Score: 17    End of Session Equipment Utilized During Treatment: Rolling walker;Oxygen(2L)  OT Visit Diagnosis: Unsteadiness on feet (R26.81);Other abnormalities of gait and mobility (R26.89);Muscle weakness (generalized) (M62.81)   Activity Tolerance Patient tolerated treatment well   Patient Left in chair;with call bell/phone within reach   Nurse Communication Mobility status        Time: 8850-2774 OT Time Calculation (min): 20 min  Charges: OT General Charges $OT Visit: 1 Visit OT Treatments $Self Care/Home Management : 8-22 mins  Brice, OTR/L Acute Rehab Pager: [phone] Office: [place] 12/30/2019, 4:33 PM

## 2019-12-30 NOTE — Plan of Care (Signed)

## 2019-12-30 NOTE — Progress Notes (Addendum)
Advanced Heart Failure Rounding Note  PCP-Cardiologist: No primary care provider on file.   Subjective:    Dobutamine added to milrinone for persistently low output. On Dobutamine 2.5 + Milrinone 0.5. Co-ox 61%.    Remains on lasix gtt at 20. Also getting metolazone 5mg  daily. -2.5L out yesterday. Net Negative -13 L since admit. Wt down 38 lb overall. BMP pending.   Symptoms much improved. No dyspnea at rest.    Objective:   Weight Range: 82.4 kg Body mass index is 23.97 kg/m.   Vital Signs:   Temp:  [98.1 F (36.7 C)-98.3 F (36.8 C)] 98.1 F (36.7 C) (01/26 0630) Pulse Rate:  [75-81] 81 (01/26 0630) Resp:  [17-18] 17 (01/26 0630) BP: (86-143)/(60-62) 143/60 (01/26 0630) SpO2:  [99 %] 99 % (01/26 0630) Last BM Date: 12/28/19  Weight change: Filed Weights   12/27/19 0558 12/28/19 0208 12/29/19 0309  Weight: 90.3 kg 87.6 kg 82.4 kg    Intake/Output:   Intake/Output Summary (Last 24 hours) at 12/30/2019 0952 Last data filed at 12/29/2019 1832 Gross per 24 hour  Intake 792.94 ml  Output 1750 ml  Net -957.06 ml      Physical Exam   CVP 7  General:  Weak appearing AAM, sitting up In the chair.  No resp difficulty HEENT: normal Neck: supple. JVP 7 . Carotids 2+ bilat; no bruits. No lymphadenopathy or thryomegaly appreciated. Cor: PMI laterally displaced. Regular rate & rhythm. No rubs, or murmurs. +s3 .2/6 TR R upper chest tunneled cather Lungs: clear no wheeze Abdomen: soft, nontender, nondistended. No hepatosplenomegaly. No bruits or masses. Good bowel sounds. Extremities: no cyanosis, clubbing, rash, bilateral unna boots. Trace bilateral LEE Neuro: alert & oriented x 3, cranial nerves grossly intact. moves all 4 extremities w/o difficulty. Affect pleasant   Telemetry   Sinus V paced 70s-80s Personally reviewed  EKG   n/a   Labs    CBC Recent Labs    12/28/19 0403 12/29/19 0344  WBC 5.0 5.5  HGB 8.5* 9.3*  HCT 27.7* 29.9*  MCV 63.5* 64.2*    PLT 155 494   Basic Metabolic Panel Recent Labs    12/28/19 0403 12/29/19 0344  NA 127* 127*  K 3.7 3.7  CL 85* 81*  CO2 29 32  GLUCOSE 108* 107*  BUN 46* 45*  CREATININE 2.33* 2.45*  CALCIUM 8.7* 9.0   Liver Function Tests No results for input(s): AST, ALT, ALKPHOS, BILITOT, PROT, ALBUMIN in the last 72 hours. No results for input(s): LIPASE, AMYLASE in the last 72 hours. Cardiac Enzymes No results for input(s): CKTOTAL, CKMB, CKMBINDEX, TROPONINI in the last 72 hours.  BNP: BNP (last 3 results) Recent Labs    08/21/19 2013 10/23/19 1048 12/24/19 1657  BNP 2,515.0* 3,200.7* 2,727.7*    ProBNP (last 3 results) No results for input(s): PROBNP in the last 8760 hours.   D-Dimer No results for input(s): DDIMER in the last 72 hours. Hemoglobin A1C No results for input(s): HGBA1C in the last 72 hours. Fasting Lipid Panel No results for input(s): CHOL, HDL, LDLCALC, TRIG, CHOLHDL, LDLDIRECT in the last 72 hours. Thyroid Function Tests No results for input(s): TSH, T4TOTAL, T3FREE, THYROIDAB in the last 72 hours.  Invalid input(s): FREET3  Other results:   Imaging    No results found.   Medications:     Scheduled Medications: . amiodarone  200 mg Oral Daily  . apixaban  5 mg Oral BID  . Chlorhexidine Gluconate Cloth  6  each Topical Daily  . docusate sodium  100 mg Oral Daily  . lactobacillus  1 g Oral TID WC  . metolazone  5 mg Oral Daily  . pantoprazole  40 mg Oral Daily  . polyethylene glycol  17 g Oral Daily  . sodium chloride flush  10-40 mL Intracatheter Q12H  . sucralfate  1 g Oral TID WC & HS  . tamsulosin  0.4 mg Oral QPC supper    Infusions: . DOBUTamine 2.5 mcg/kg/min (12/29/19 0743)  . furosemide (LASIX) infusion 20 mg/hr (12/29/19 2345)  . milrinone 0.5 mcg/kg/min (12/30/19 0438)    PRN Medications: acetaminophen, alum & mag hydroxide-simeth, calcium carbonate, guaiFENesin-dextromethorphan, hydrocortisone cream, loperamide,  magnesium hydroxide, sodium chloride flush     Assessment/Plan   1. 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 [place] 2015 with moderate disease. Has Biotronik BiV ICD. - ECHO 10/22/19 EF severely EF 5-10% with severe MR/TR, biatrial enlargement, RV with moderately reduced function - On milrinone 0.5 mcg. Dobutamine 2.5 added 1/22 due to persistently low co-ox. Co-ox stable at 61%. - Volume status much improved, down 38 lb total. CVP 7. Wt back to baseline at 171 lb.  - Stop Lasix gtt today and monitor volume status closely. Resume PO torsemide tomorrow.  - Discontinue Dobutamine and continue milrinone. Follow co-ox.  - No bb with low output. No Arb/spiro/dig with elevated creatinine.  - continue w/ unna boots.   2. Urinary Retention  -He had foley catheter until last week.  - Urine output adequate. On flomax.   3. CKD Stage IIIb - Recent creatinine baseline 2.5-2.8  - Repeat BMP pending today  4.  H/O RLE DVT -Continue eliquis 5 mg twice a day.  -Hgb stable 9.3   5. Hyponatremia - Na 126 -> 127->127  - may need tolvaptan  5. DNR/DNI - Discussed code status. He confirms DNR/DNI. He wants to keep ICD on. He had GOLD DNR form with him.  -Active with Hospice services provided by [facility].    Length of Stay: [address] [place]  12/30/2019, 9:52 AM  Advanced Heart Failure Team Pager [phone] (M-F; 7a - 4p)  Please contact [facility] for night-coverage after hours (4p -7a ) and weekends on [email or web address]   Patient seen and examined with the above-signed Advanced Practice Provider and/or Housestaff. I personally reviewed laboratory data, imaging studies and relevant notes. I independently examined the patient and formulated the important aspects of the plan. I have edited the note to reflect any of my changes or salient points. I have personally discussed the plan with  the patient and/or family.  Remains on milrinone and dobutamine and high-dose IV lasix. Volume status now much improved. Weight down 38 pounds. CVP 7. Co-ox ok.  Will stop lasix gtt and dobutamine. Continue milrinone. The question will be if he will be able to maintain euvolemia on single inotrope therapy or if he will develop recurrent shock and volume overload. Will follow closely.  Glori Bickers, MD  2:29 PM

## 2019-12-30 NOTE — Progress Notes (Signed)
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 Presented to [facility] via EMS complaining shortness of breath, constipation, and thigh/scrotal swelling due to volume overload 2/2 CHF exacerbation.    PT Comments    Patient seen for mobility progression. Pt tolerated gait distance of 70 ft with and overall min guard for mobility this session. VSS on 2L O2 via [place]. Continue to progress as tolerated.     Follow Up Recommendations  Supervision/Assistance - 24 hour; plan is home with resumed hospice care      Equipment Recommendations  None recommended by PT    Recommendations for Other Services       Precautions / Restrictions Precautions Precautions: Fall Restrictions Weight Bearing Restrictions: No    Mobility  Bed Mobility               General bed mobility comments: pt OOB in chair upon arrival  Transfers Overall transfer level: Needs assistance Equipment used: Rolling walker (2 wheeled) Transfers: Sit to/from Stand Sit to Stand: Min guard         General transfer comment: min guard for safety; no physical assist required to stand this session  Ambulation/Gait Ambulation/Gait assistance: Min guard Gait Distance (Feet): 70 Feet Assistive device: Rolling walker (2 wheeled) Gait Pattern/deviations: Step-through pattern;Decreased stride length;Decreased dorsiflexion - right;Trunk flexed;Decreased step length - right;Decreased step length - left;Decreased dorsiflexion - left;Shuffle Gait velocity: decreased   General Gait Details: pt with decreased bilat step lengths and height; shuffling when fatigued; cues for upright posture   Stairs              Wheelchair Mobility    Modified Rankin (Stroke Patients Only)       Balance Overall balance assessment: Needs assistance Sitting-balance support: Single extremity supported;Feet supported Sitting balance-Leahy Scale: Good     Standing balance support: Bilateral upper extremity supported Standing balance-Leahy Scale: Poor                              Cognition Arousal/Alertness: Awake/alert Behavior During Therapy: WFL for tasks assessed/performed Overall Cognitive Status: Within Functional Limits for tasks assessed                                 General Comments: Very motivated to participate in therapy      Exercises      General Comments        Pertinent Vitals/Pain Pain Assessment: No/denies pain    Home Living                      Prior Function            PT Goals (current goals can now be found in the care plan section) Acute Rehab PT Goals Patient Stated Goal: To return to independent mobility Progress towards PT goals: Progressing toward goals    Frequency    Min 3X/week      PT Plan Current plan remains appropriate    Co-evaluation              AM-PAC PT "6  Clicks" Mobility   Outcome Measure  Help needed turning from your back to your side while in a flat bed without using bedrails?: A Little Help needed moving from lying on your back to sitting on the side of a flat bed without using bedrails?: A Little Help needed moving to and from a bed to a chair (including a wheelchair)?: A Little Help needed standing up from a chair using your arms (e.g., wheelchair or bedside chair)?: A Little Help needed to walk in hospital room?: A Little Help needed climbing 3-5 steps with a railing? : A Lot 6 Click Score: 17    End of Session Equipment Utilized During Treatment: Oxygen;Gait belt Activity Tolerance: Patient tolerated treatment well Patient left: in chair;with call bell/phone within reach;with  family/visitor present Nurse Communication: Mobility status PT Visit Diagnosis: Muscle weakness (generalized) (M62.81);Unsteadiness on feet (R26.81)     Time: 8828-0034 PT Time Calculation (min) (ACUTE ONLY): 37 min  Charges:  $Gait Training: 23-37 mins                     Earney Navy, PTA Acute Rehabilitation Services Pager: [phone] Office: [phone]     Darliss Cheney 12/30/2019, 1:14 PM

## 2019-12-31 LAB — BASIC METABOLIC PANEL
Anion gap: 11 (ref 5–15)
BUN: 47 mg/dL — ABNORMAL HIGH (ref 8–23)
CO2: 36 mmol/L — ABNORMAL HIGH (ref 22–32)
Calcium: 8.6 mg/dL — ABNORMAL LOW (ref 8.9–10.3)
Chloride: 78 mmol/L — ABNORMAL LOW (ref 98–111)
Creatinine, Ser: 2.19 mg/dL — ABNORMAL HIGH (ref 0.61–1.24)
GFR calc Af Amer: 32 mL/min — ABNORMAL LOW (ref >60)
GFR calc non Af Amer: 28 mL/min — ABNORMAL LOW (ref >60)
Glucose, Bld: 185 mg/dL — ABNORMAL HIGH (ref 70–99)
Potassium: 3.2 mmol/L — ABNORMAL LOW (ref 3.5–5.1)
Sodium: 125 mmol/L — ABNORMAL LOW (ref 135–145)

## 2019-12-31 LAB — COOXEMETRY PANEL
Carboxyhemoglobin: 2.3 % — ABNORMAL HIGH (ref 0.5–1.5)
Methemoglobin: 1.1 % (ref 0.0–1.5)
O2 Saturation: 59.8 %
Total hemoglobin: 8.7 g/dL — ABNORMAL LOW (ref 12.0–16.0)

## 2019-12-31 MED ORDER — FUROSEMIDE 10 MG/ML IJ SOLN
30.0000 mg/h | INTRAVENOUS | Status: DC
  Administered 2019-12-31 – 2020-01-01 (×2): 20 mg/h via INTRAVENOUS
  Administered 2020-01-01 – 2020-01-03 (×4): 30 mg/h via INTRAVENOUS
  Filled 2019-12-31: qty 25
  Filled 2019-12-31: qty 21
  Filled 2019-12-31 (×7): qty 25
  Filled 2019-12-31: qty 21

## 2019-12-31 MED ORDER — POTASSIUM CHLORIDE CRYS ER 20 MEQ PO TBCR
40.0000 meq | EXTENDED_RELEASE_TABLET | Freq: Once | ORAL | Status: AC
  Administered 2019-12-31: 16:00:00 40 meq via ORAL
  Filled 2019-12-31: qty 2

## 2019-12-31 MED ORDER — POTASSIUM CHLORIDE CRYS ER 20 MEQ PO TBCR
40.0000 meq | EXTENDED_RELEASE_TABLET | Freq: Two times a day (BID) | ORAL | Status: AC
  Administered 2019-12-31 (×2): 40 meq via ORAL
  Filled 2019-12-31 (×2): qty 2

## 2019-12-31 MED ORDER — FUROSEMIDE 10 MG/ML IJ SOLN
120.0000 mg | Freq: Once | INTRAVENOUS | Status: AC
  Administered 2019-12-31: 120 mg via INTRAVENOUS
  Filled 2019-12-31: qty 2

## 2019-12-31 NOTE — Progress Notes (Signed)
  Evening round. - volume trending back up after discontinuation of lasix gtt 1/26 - poor urinary response after 1 x dose of 120 IV lasix earlier today. Only 200 cc in UOP. CVP trending up at 18.  - discussed w/ Dr. Haroldine Laws. Will resume lasix gtt at 20 mg/hr.

## 2019-12-31 NOTE — Plan of Care (Signed)

## 2019-12-31 NOTE — Progress Notes (Addendum)
Advanced Heart Failure Rounding Note  PCP-Cardiologist: No primary care provider on file.   Subjective:    Dobutamine discontinued yesterday. Remains on milrinone 0.5 mcg. Co-ox stable  60%.   Lasix gtt discontinued yesterday. Back on torsemide 60 bid. -1.4L out yesterday. Wt stable today, down 1 lb at 170 lb but CVP reading higher today ~14.   Scr continues to improve, 2.45>>2.21>>2.19   BP low this am upper 11B systolic but asymptomatic. Already received am dose of torsemide    Objective:   Weight Range: 77.2 kg Body mass index is 22.45 kg/m.   Vital Signs:   Temp:  [98.1 F (36.7 C)-98.6 F (37 C)] 98.4 F (36.9 C) (01/27 0439) Pulse Rate:  [80-82] 81 (01/26 2343) Resp:  [15-18] 15 (01/27 0439) BP: (86-161)/(57-82) 86/57 (01/27 0439) SpO2:  [95 %-99 %] 97 % (01/27 0439) Weight:  [77.2 kg-78 kg] 77.2 kg (01/27 0622) Last BM Date: 12/30/19(per patient RN did not witness)  Weight change: Filed Weights   12/29/19 0309 12/30/19 1032 12/31/19 0622  Weight: 82.4 kg 78 kg 77.2 kg    Intake/Output:   Intake/Output Summary (Last 24 hours) at 12/31/2019 0903 Last data filed at 12/31/2019 0700 Gross per 24 hour  Intake 504 ml  Output 1400 ml  Net -896 ml      Physical Exam   CVP 14 General:  Weak appearing AAM. In bed.  No resp difficulty HEENT: normal anicteric  Neck: supple. Elevated JVD . Carotids 2+ bilat; no bruits. No lymphadenopathy or thryomegaly appreciated. Cor: PMI laterally displaced. Regular rate & rhythm. No rubs, or murmurs. +s3 .2/6 TR R upper chest tunneled cather Lungs: clear no wheeze Abdomen: soft, nontender, nondistended. No hepatosplenomegaly. No bruits or masses. Good bowel sounds. Extremities: no cyanosis, clubbing, rash. trace edema. + unna boots  Neuro: alert & oriented x 3, cranial nerves grossly intact. moves all 4 extremities w/o difficulty. Affect pleasant  Telemetry   Sinus V paced mid 70s Personally reviewed  EKG   n/a    Labs    CBC Recent Labs    12/29/19 0344  WBC 5.5  HGB 9.3*  HCT 29.9*  MCV 64.2*  PLT 147   Basic Metabolic Panel Recent Labs    12/30/19 0956 12/31/19 0452  NA 127* 125*  K 3.0* 3.2*  CL 78* 78*  CO2 37* 36*  GLUCOSE 116* 185*  BUN 44* 47*  CREATININE 2.21* 2.19*  CALCIUM 9.0 8.6*   Liver Function Tests No results for input(s): AST, ALT, ALKPHOS, BILITOT, PROT, ALBUMIN in the last 72 hours. No results for input(s): LIPASE, AMYLASE in the last 72 hours. Cardiac Enzymes No results for input(s): CKTOTAL, CKMB, CKMBINDEX, TROPONINI in the last 72 hours.  BNP: BNP (last 3 results) Recent Labs    08/21/19 2013 10/23/19 1048 12/24/19 1657  BNP 2,515.0* 3,200.7* 2,727.7*    ProBNP (last 3 results) No results for input(s): PROBNP in the last 8760 hours.   D-Dimer No results for input(s): DDIMER in the last 72 hours. Hemoglobin A1C No results for input(s): HGBA1C in the last 72 hours. Fasting Lipid Panel No results for input(s): CHOL, HDL, LDLCALC, TRIG, CHOLHDL, LDLDIRECT in the last 72 hours. Thyroid Function Tests No results for input(s): TSH, T4TOTAL, T3FREE, THYROIDAB in the last 72 hours.  Invalid input(s): FREET3  Other results:   Imaging    No results found.   Medications:     Scheduled Medications: . amiodarone  200 mg Oral Daily  .  apixaban  5 mg Oral BID  . Chlorhexidine Gluconate Cloth  6 each Topical Daily  . docusate sodium  100 mg Oral Daily  . lactobacillus  1 g Oral TID WC  . pantoprazole  40 mg Oral Daily  . polyethylene glycol  17 g Oral Daily  . sodium chloride flush  10-40 mL Intracatheter Q12H  . sucralfate  1 g Oral TID WC & HS  . tamsulosin  0.4 mg Oral QPC supper  . torsemide  60 mg Oral BID    Infusions: . milrinone 0.5 mcg/kg/min (12/31/19 0803)    PRN Medications: acetaminophen, alum & mag hydroxide-simeth, calcium carbonate, guaiFENesin-dextromethorphan, hydrocortisone cream, loperamide, magnesium  hydroxide, sodium chloride flush     Assessment/Plan   1. 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 [place] 2015 with moderate disease. Has Biotronik BiV ICD. - ECHO 10/22/19 EF severely EF 5-10% with severe MR/TR, biatrial enlargement, RV with moderately reduced function  - On milrinone 0.5 mcg. Dobutamine 2.5 added 1/22 due to persistently low co-ox. Dobutamine discontinued 1/26 - Co-ox stable at 60% today.  - Volume status much improved, down 39 lb total since admit. Changed back to PO diuretics yesterday, torsemide 60 mg bid. CVP reading higher today at 14 after lasix gtt discontinuation. Discussed w/ Dr. Haroldine Laws, Plan to give dose of IV Lasix @1200 .  - Monitor CVP closely  - No bb with low output. No Arb/spiro/dig with elevated creatinine.  - continue w/ unna boots.   2. Urinary Retention  -He had foley catheter until last week.  - Urine output adequate. On flomax.   3. CKD Stage IIIb - Recent creatinine baseline 2.5-2.8  - SCr continues to improve, down to 2.2 today   4.  H/O RLE DVT -Continue eliquis 5 mg twice a day.  -Hgb stable 9.3   5. Hyponatremia - Na 126 -> 127->127-->125 - may need tolvaptan  6. Hypokalemia - K 3.2 - supp w/ Kdur 40 mEq tid today   7. DNR/DNI - Discussed code status. He confirms DNR/DNI. He wants to keep ICD on. He had GOLD DNR form with him.  -Active with Hospice services provided by [facility].    Length of Stay: [address] PA-C  12/31/2019, 9:03 AM  Advanced Heart Failure Team Pager [phone] (M-F; 7a - 4p)  Please contact [facility] for night-coverage after hours (4p -7a ) and weekends on [email or web address]  Patient seen and examined with the above-signed Advanced Practice Provider and/or Housestaff. I personally reviewed laboratory data, imaging studies and relevant notes. I independently examined the patient and formulated the  important aspects of the plan. I have edited the note to reflect any of my changes or salient points. I have personally discussed the plan with the patient and/or family.  Now off dobutamine. Co-ox stable on milrinone alone. CVP climbing. Renal function ok. K low.   Will give extra dose of IV lasix today. Follow co-ox and renal function closely.   Glori Bickers, MD  2:30 PM

## 2020-01-01 LAB — BASIC METABOLIC PANEL
Anion gap: 13 (ref 5–15)
BUN: 52 mg/dL — ABNORMAL HIGH (ref 8–23)
CO2: 34 mmol/L — ABNORMAL HIGH (ref 22–32)
Calcium: 8.7 mg/dL — ABNORMAL LOW (ref 8.9–10.3)
Chloride: 82 mmol/L — ABNORMAL LOW (ref 98–111)
Creatinine, Ser: 2.25 mg/dL — ABNORMAL HIGH (ref 0.61–1.24)
GFR calc Af Amer: 31 mL/min — ABNORMAL LOW (ref >60)
GFR calc non Af Amer: 27 mL/min — ABNORMAL LOW (ref >60)
Glucose, Bld: 115 mg/dL — ABNORMAL HIGH (ref 70–99)
Potassium: 3.9 mmol/L (ref 3.5–5.1)
Sodium: 129 mmol/L — ABNORMAL LOW (ref 135–145)

## 2020-01-01 LAB — COOXEMETRY PANEL
Carboxyhemoglobin: 2 % — ABNORMAL HIGH (ref 0.5–1.5)
Methemoglobin: 1.2 % (ref 0.0–1.5)
O2 Saturation: 52.4 %
Total hemoglobin: 9 g/dL — ABNORMAL LOW (ref 12.0–16.0)

## 2020-01-01 MED ORDER — POTASSIUM CHLORIDE CRYS ER 20 MEQ PO TBCR
40.0000 meq | EXTENDED_RELEASE_TABLET | Freq: Two times a day (BID) | ORAL | Status: DC
  Administered 2020-01-02 – 2020-01-05 (×7): 40 meq via ORAL
  Filled 2020-01-01 (×7): qty 2

## 2020-01-01 MED ORDER — METOLAZONE 5 MG PO TABS
5.0000 mg | ORAL_TABLET | Freq: Every day | ORAL | Status: DC
  Administered 2020-01-01 – 2020-01-02 (×2): 5 mg via ORAL
  Filled 2020-01-01 (×3): qty 1

## 2020-01-01 MED ORDER — POTASSIUM CHLORIDE CRYS ER 20 MEQ PO TBCR
40.0000 meq | EXTENDED_RELEASE_TABLET | Freq: Two times a day (BID) | ORAL | Status: DC
  Administered 2020-01-01: 40 meq via ORAL
  Filled 2020-01-01: qty 2

## 2020-01-01 NOTE — Progress Notes (Addendum)
Advanced Heart Failure Rounding Note  PCP-Cardiologist: No primary care provider on file.   Subjective:    Dobutamine discontinued 1/26. Remains on milrinone 0.5 mcg. Co-ox declining off of dobutamine, 61>>60>>52%.     Did not do well off of lasix gtt. CVP increased back up to 18 last PM and lasix gtt resumed at 20 mg/hr.   UOP poor, only 750 cc out despite lasix gtt.  CVP remains elevated at 18 today. Scr 2.25 c/w baseline.   Asymptomatic at rest.   Objective:   Weight Range: 76.6 kg Body mass index is 22.28 kg/m.   Vital Signs:   Temp:  [97.7 F (36.5 C)-98.2 F (36.8 C)] 97.7 F (36.5 C) (01/28 0815) Pulse Rate:  [82-112] 112 (01/28 0815) Resp:  [13-16] 16 (01/28 0815) BP: (83-87)/(57-60) 87/57 (01/28 0815) SpO2:  [100 %] 100 % (01/28 0815) Weight:  [76.6 kg] 76.6 kg (01/28 0446) Last BM Date: 12/31/19(per pt)  Weight change: Filed Weights   12/30/19 1032 12/31/19 0622 01/01/20 0446  Weight: 78 kg 77.2 kg 76.6 kg    Intake/Output:   Intake/Output Summary (Last 24 hours) at 01/01/2020 0853 Last data filed at 01/01/2020 0518 Gross per 24 hour  Intake 861.42 ml  Output 750 ml  Net 111.42 ml      Physical Exam  CVP 18 General:  Weak appearing AAM.  In bed. No distress.   HEENT: normal anicteric  Neck: supple. Elevated JVD to ear . Carotids 2+ bilat; no bruits. No lymphadenopathy or thryomegaly appreciated. Cor: PMI laterally displaced. Regular rate & rhythm. No rubs, or murmurs. +s3 .2/6 TR R upper chest tunneled cather Lungs: clear no whezze  Abdomen: soft, nontender, mildly distended. No hepatosplenomegaly. No bruits or masses. Good bowel sounds. Extremities: no cyanosis, clubbing, rash. 1+ bilateral LEE to knees . + unna boots Cool  Neuro: alert & oriented x 3, cranial nerves grossly intact. moves all 4 extremities w/o difficulty. Affect pleasant   Telemetry   Sinus V paced mid 70s Personally reviewed  EKG   n/a   Labs    CBC No results for  input(s): WBC, NEUTROABS, HGB, HCT, MCV, PLT in the last 72 hours. Basic Metabolic Panel Recent Labs    12/31/19 0452 01/01/20 0354  NA 125* 129*  K 3.2* 3.9  CL 78* 82*  CO2 36* 34*  GLUCOSE 185* 115*  BUN 47* 52*  CREATININE 2.19* 2.25*  CALCIUM 8.6* 8.7*   Liver Function Tests No results for input(s): AST, ALT, ALKPHOS, BILITOT, PROT, ALBUMIN in the last 72 hours. No results for input(s): LIPASE, AMYLASE in the last 72 hours. Cardiac Enzymes No results for input(s): CKTOTAL, CKMB, CKMBINDEX, TROPONINI in the last 72 hours.  BNP: BNP (last 3 results) Recent Labs    08/21/19 2013 10/23/19 1048 12/24/19 1657  BNP 2,515.0* 3,200.7* 2,727.7*    ProBNP (last 3 results) No results for input(s): PROBNP in the last 8760 hours.   D-Dimer No results for input(s): DDIMER in the last 72 hours. Hemoglobin A1C No results for input(s): HGBA1C in the last 72 hours. Fasting Lipid Panel No results for input(s): CHOL, HDL, LDLCALC, TRIG, CHOLHDL, LDLDIRECT in the last 72 hours. Thyroid Function Tests No results for input(s): TSH, T4TOTAL, T3FREE, THYROIDAB in the last 72 hours.  Invalid input(s): FREET3  Other results:   Imaging    No results found.   Medications:     Scheduled Medications: . amiodarone  200 mg Oral Daily  . apixaban  5 mg Oral BID  . Chlorhexidine Gluconate Cloth  6 each Topical Daily  . docusate sodium  100 mg Oral Daily  . lactobacillus  1 g Oral TID WC  . pantoprazole  40 mg Oral Daily  . polyethylene glycol  17 g Oral Daily  . sodium chloride flush  10-40 mL Intracatheter Q12H  . sucralfate  1 g Oral TID WC & HS  . tamsulosin  0.4 mg Oral QPC supper    Infusions: . furosemide (LASIX) infusion 20 mg/hr (01/01/20 0400)  . milrinone 0.5 mcg/kg/min (01/01/20 0704)    PRN Medications: acetaminophen, alum & mag hydroxide-simeth, calcium carbonate, guaiFENesin-dextromethorphan, hydrocortisone cream, loperamide, magnesium hydroxide, sodium  chloride flush     Assessment/Plan   1. 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 [place] 2015 with moderate disease. Has Biotronik BiV ICD. - ECHO 10/22/19 EF severely EF 5-10% with severe MR/TR, biatrial enlargement, RV with moderately reduced function  - On milrinone 0.5 mcg. Dobutamine 2.5 added 1/22 due to persistently low co-ox (30s-40s). Dobutamine discontinued 1/26 - Co-ox drifting down 61>>60>>52% - Volume up. CVP 18. Poor UOP despite high dose lasix gtt. SCr stable at 2.25.  - Will add metolazone 5 mg daily to help w/ diuresis.  - Continue lasix gtt 20 mg/hr. - Monitor CVP closely  - No bb with low output. No Arb/spiro/dig with elevated creatinine.  - continue w/ unna boots.   2. Urinary Retention  -He had foley catheter until last week.  - Urine output adequate. On flomax.   3. CKD Stage IIIb - Recent creatinine baseline 2.5-2.8  - SCr stable at 2.25  4.  H/O RLE DVT -Continue eliquis 5 mg twice a day.   5. Hyponatremia - Na 126 -> 127->127-->125-->129 - likely hypervolemic hyponatremia  - may need tolvaptan  6. Hypokalemia - K 3.9 - supp w/ Kdur 40 mEq tid today   7. DNR/DNI - Discussed code status. He confirms DNR/DNI. He wants to keep ICD on. He had GOLD DNR form with him.  -Active with Hospice services provided by [facility].    Length of Stay: [address] [place]  01/01/2020, 8:53 AM  Advanced Heart Failure Team Pager [phone] (M-F; 7a - 4p)  Please contact [facility] for night-coverage after hours (4p -7a ) and weekends on [email or web address]   Patient seen and examined with the above-signed Advanced Practice Provider and/or Housestaff. I personally reviewed laboratory data, imaging studies and relevant notes. I independently examined the patient and formulated the important aspects of the plan. I have edited the note to reflect any of my changes or  salient points. I have personally discussed the plan with the patient and/or family.  Co-ox and urine output down after stopping dobutamine. Remains on milrinone 0.5. Suspect he has reached end-stage. Will increase lasix gtt to 30 and add metolazone. If volume status continues to trend up will need to switch to comfort care. Be careful with K supp in setting of poor urine output and low cardiac output.   Glori Bickers, MD  2:56 PM

## 2020-01-01 NOTE — Plan of Care (Signed)
  Problem: Education: Goal: Knowledge of General Education information will improve Description: Including pain rating scale, medication(s)/side effects and non-pharmacologic comfort measures Outcome: Progressing   Problem: Clinical Measurements: Goal: Ability to maintain clinical measurements within normal limits will improve Outcome: Progressing Goal: Diagnostic test results will improve Outcome: Progressing   Problem: Elimination: Goal: Will not experience complications related to bowel motility Outcome: Progressing Goal: Will not experience complications related to urinary retention Outcome: Progressing

## 2020-01-01 NOTE — Plan of Care (Signed)

## 2020-01-01 NOTE — Progress Notes (Signed)
Physical Therapy Treatment Patient Details Name: Spencer Rice MRN: [id number] DOB: 1941-07-02 Today's Date: 01/01/2020    History of Present Illness 79 year old with a history of end-stage systolic HF due to NICM EF 10% s/p Biotronik CRT-D,  severe MR/TR, LBBB, CKD stage 3 s/p previous ablation of kidney C (1.8-2.4), COPD, DMII and recent RP bleed. Presented to [facility] via EMS complaining shortness of breath, constipation, and thigh/scrotal swelling due to volume overload 2/2 CHF exacerbation.    PT Comments    Patient continues to make gradual progress with mobility. PT will continue to follow acutely.    Follow Up Recommendations  Supervision/Assistance - 24 hour; Plan is for home with continued hospice care services     Equipment Recommendations  None recommended by PT    Recommendations for Other Services       Precautions / Restrictions Precautions Precautions: Fall Restrictions Weight Bearing Restrictions: No    Mobility  Bed Mobility               General bed mobility comments: pt OOB in chair upon arrival  Transfers Overall transfer level: Needs assistance Equipment used: Rolling walker (2 wheeled) Transfers: Sit to/from Stand Sit to Stand: Min guard         General transfer comment: min guard for safety; no physical assist required to stand this session  Ambulation/Gait Ambulation/Gait assistance: Min guard Gait Distance (Feet): 100 Feet Assistive device: Rolling walker (2 wheeled) Gait Pattern/deviations: Step-through pattern;Decreased stride length;Trunk flexed Gait velocity: decreased   General Gait Details: slight trunk flexion; pt with impoved bilat step lengths this session   Stairs             Wheelchair Mobility    Modified Rankin (Stroke Patients Only)       Balance Overall balance assessment: Needs assistance   Sitting balance-Leahy Scale: Good     Standing balance support: Bilateral upper extremity  supported Standing balance-Leahy Scale: Poor                              Cognition Arousal/Alertness: Awake/alert Behavior During Therapy: WFL for tasks assessed/performed Overall Cognitive Status: Within Functional Limits for tasks assessed                                 General Comments: some confusion noted but [facility] for simple mobility tasks      Exercises      General Comments        Pertinent Vitals/Pain Pain Assessment: No/denies pain    Home Living                      Prior Function            PT Goals (current goals can now be found in the care plan section) Progress towards PT goals: Progressing toward goals    Frequency    Min 3X/week      PT Plan Current plan remains appropriate    Co-evaluation              AM-PAC PT "6 Clicks" Mobility   Outcome Measure  Help needed turning from your back to your side while in a flat bed without using bedrails?: A Little Help needed moving from lying on your back to sitting on the side of a flat bed without using bedrails?: A Little Help  needed moving to and from a bed to a chair (including a wheelchair)?: A Little Help needed standing up from a chair using your arms (e.g., wheelchair or bedside chair)?: A Little Help needed to walk in hospital room?: A Little Help needed climbing 3-5 steps with a railing? : A Little 6 Click Score: 18    End of Session Equipment Utilized During Treatment: Oxygen;Gait belt Activity Tolerance: Patient tolerated treatment well Patient left: in chair;with call bell/phone within reach Nurse Communication: Mobility status PT Visit Diagnosis: Muscle weakness (generalized) (M62.81);Unsteadiness on feet (R26.81)     Time: 6812-7517 PT Time Calculation (min) (ACUTE ONLY): 30 min  Charges:  $Gait Training: 23-37 mins                     Earney Navy, PTA Acute Rehabilitation Services Pager: [phone] Office: [phone]      Darliss Cheney 01/01/2020, 1:10 PM

## 2020-01-02 LAB — BASIC METABOLIC PANEL
Anion gap: 13 (ref 5–15)
BUN: 53 mg/dL — ABNORMAL HIGH (ref 8–23)
CO2: 36 mmol/L — ABNORMAL HIGH (ref 22–32)
Calcium: 8.8 mg/dL — ABNORMAL LOW (ref 8.9–10.3)
Chloride: 80 mmol/L — ABNORMAL LOW (ref 98–111)
Creatinine, Ser: 2.41 mg/dL — ABNORMAL HIGH (ref 0.61–1.24)
GFR calc Af Amer: 29 mL/min — ABNORMAL LOW (ref >60)
GFR calc non Af Amer: 25 mL/min — ABNORMAL LOW (ref >60)
Glucose, Bld: 113 mg/dL — ABNORMAL HIGH (ref 70–99)
Potassium: 3 mmol/L — ABNORMAL LOW (ref 3.5–5.1)
Sodium: 129 mmol/L — ABNORMAL LOW (ref 135–145)

## 2020-01-02 LAB — COOXEMETRY PANEL
Carboxyhemoglobin: 1.3 % (ref 0.5–1.5)
Methemoglobin: 0.6 % (ref 0.0–1.5)
O2 Saturation: 49.9 %
Total hemoglobin: 8.7 g/dL — ABNORMAL LOW (ref 12.0–16.0)

## 2020-01-02 LAB — MAGNESIUM: Magnesium: 2.3 mg/dL (ref 1.7–2.4)

## 2020-01-02 MED ORDER — POTASSIUM CHLORIDE CRYS ER 20 MEQ PO TBCR
40.0000 meq | EXTENDED_RELEASE_TABLET | Freq: Once | ORAL | Status: AC
  Administered 2020-01-02: 40 meq via ORAL
  Filled 2020-01-02: qty 2

## 2020-01-02 NOTE — TOC Initial Note (Signed)
Transition of Care [facility]) - Initial/Assessment Note    Patient Details  Name: Spencer Rice MRN: [id number] Date of Birth: [birth year]  [facility]) CM/SW Contact:    Alberteen Sam, [place] Phone Number: [phone] 01/02/2020, 4:05 PM  Clinical Narrative:                  CSW made referral to [facility] for residential hospice placement with preference for [facility], Audrea Muscat with Gwen Pounds will follow up.   Expected Discharge Plan: [place] Barriers to Discharge: Continued Medical Work up   Patient Goals and CMS Choice   CMS Medicare.gov Compare Post Acute Care list provided to:: Patient Choice offered to / list presented to : Patient  Expected Discharge Plan and Services Expected Discharge Plan: [place]     Post Acute Care Choice: Hospice Living arrangements for the past 2 months: Single Family Home                                      Prior Living Arrangements/Services Living arrangements for the past 2 months: Single Family Home Lives with:: Self Patient language and need for interpreter reviewed:: Yes Do you feel safe going back to the place where you live?: No   needs residential hospice  Need for Family Participation in Patient Care: Yes (Comment) Care giver support system in place?: Yes (comment)   Criminal Activity/Legal Involvement Pertinent to Current Situation/Hospitalization: No - Comment as needed  Activities of Daily Living Home Assistive Devices/Equipment: Walker (specify type), Crutches ADL Screening (condition at time of admission) Patient's cognitive ability adequate to safely complete daily activities?: Yes Is the patient deaf or have difficulty hearing?: No Does the patient have difficulty seeing, even when wearing glasses/contacts?: No Does the patient have difficulty concentrating, remembering, or making decisions?: No Patient able to express need for assistance with ADLs?: Yes Does the  patient have difficulty dressing or bathing?: No Independently performs ADLs?: Yes (appropriate for developmental age) Does the patient have difficulty walking or climbing stairs?: Yes Weakness of Legs: Both Weakness of Arms/Hands: None  Permission Sought/Granted Permission sought to share information with : Case Manager, Customer service manager, Family Supports Permission granted to share information with : Yes, Verbal Permission Granted  Share Information with NAME: Kendal     Permission granted to share info w Relationship: son  Permission granted to share info w Contact Information: [phone]  Emotional Assessment Appearance:: Appears stated age Attitude/Demeanor/Rapport: Unable to Assess Affect (typically observed): Unable to Assess Orientation: : Oriented to Self, Oriented to Place, Oriented to  Time, Oriented to Situation Alcohol / Substance Use: Not Applicable Psych Involvement: No (comment)  Admission diagnosis:  Acute on chronic systolic (congestive) heart failure (HCC) [I50.23] Patient Active Problem List   Diagnosis Date Noted  . Advanced care planning/counseling discussion   . Goals of care, counseling/discussion   . Xerostomia   . Acute on chronic systolic heart failure ([place]) 10/23/2019  . Deep vein thrombosis (DVT) of right lower extremity ([place]) 10/23/2019  . Acute renal failure superimposed on stage 3a chronic kidney disease ([place]) 10/23/2019  . Chronic combined systolic and diastolic CHF (congestive heart failure) ([place]) 02/08/2019  . Cardiogenic shock ([place]) 02/01/2019  . Volume overload 02/01/2019  . Retroperitoneal bleed 02/01/2019  . Urinary tract infection due to Klebsiella species 02/01/2019  . Weakness generalized   . Tachypnea   . Sinus tachycardia   .  Hyponatremia   . Acute blood loss anemia   . DNR (do not resuscitate) discussion   . Palliative care by specialist   . Malnutrition of moderate degree 01/06/2019  . CKD (chronic kidney  disease) stage 3, GFR 30-59 ml/min 12/30/2018  . PVCs (premature ventricular contractions) 12/30/2018  . Acute on chronic systolic congestive heart failure ([place]) 12/29/2018  . CHF (congestive heart failure) ([place]) 05/03/2018  . Acute on chronic systolic (congestive) heart failure ([place]) 05/03/2018  . Glucose intolerance (impaired glucose tolerance) 06/23/2016  . Vitreous floaters 12/08/2015  . TIA (transient ischemic attack) 12/08/2015  . SOB (shortness of breath) 12/08/2015  . Posterior vitreous detachment of both eyes 12/08/2015  . Pain due to dental caries 12/08/2015  . Other abnormal glucose 12/08/2015  . Osteoarthrosis 12/08/2015  . Long term (current) use of anticoagulants 12/08/2015  . Kidney mass 12/08/2015  . Hypersomnolence 12/08/2015  . Hyperopia with presbyopia of both eyes 12/08/2015  . Hyperlipidemia 12/08/2015  . History of orthopnea 12/08/2015  . COPD (chronic obstructive pulmonary disease) ([place]) 12/08/2015  . 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 AKI (acute kidney injury) ([place]) 12/08/2015  . Abnormal CT scan, kidney 12/08/2015  . Abdominal bloating 12/08/2015  . Biventricular ICD (implantable cardioverter-defibrillator) in place 12/07/2015  . Chest pain at rest 11/29/2015  . Essential hypertension 11/29/2015  . Acute on chronic systolic CHF (congestive heart failure), NYHA class 1 ([place]) 11/29/2015  . Gastroesophageal reflux disease without esophagitis 11/29/2015  . Pain in the chest   . Renal cancer ([place]) 08/16/2015  . 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 Cancer of kidney ([place]) 01/19/2015  . Hematuria 09/04/2014  . Psychosexual dysfunction with inhibited sexual excitement 07/29/2014  . Pelvic pain in male 07/29/2014  . Increased urinary frequency 07/29/2014  . Heartburn 07/29/2014  . EKG, abnormal 07/29/2014  . Corneal scar 07/29/2014  . 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 Cardiomyopathy ([place]) 07/29/2014  . CAD (coronary artery  disease) 07/29/2014  . Disorder of kidney and ureter 04/09/2014   PCP:  [name]., MD Pharmacy:   [id number] [place], [place] [address] [place] [address] Phone: [phone] Fax: [phone]  [facility] [place] [place] [address] [place] [address] Phone: [phone] Fax: [phone]     Social Determinants of Health (SDOH) Interventions    Readmission Risk Interventions Readmission Risk Prevention Plan 10/27/2019  Transportation Screening Complete  Medication Review ([place]) Complete  PCP or Specialist appointment within 3-5 days of discharge Complete  HRI or [place] Complete  SW Recovery Care/Counseling Consult Complete  [place] Not Applicable  Some recent data might be hidden

## 2020-01-02 NOTE — Progress Notes (Signed)
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 Presented to [facility] via EMS complaining shortness of breath, constipation, and thigh/scrotal swelling due to volume overload 2/2 CHF exacerbation.   OT comments  Pt awakened for therapy. Up in chair. Performed one grooming activity in standing and then reported fatigue needing to sit to complete a second activity. Pt requiring min to min guard assist.  Follow Up Recommendations  Home health OT;Supervision/Assistance - 24 hour(Hospice)    Equipment Recommendations  None recommended by OT    Recommendations for Other Services      Precautions / Restrictions Precautions Precautions: Fall       Mobility Bed Mobility               General bed mobility comments: pt OOB in chair upon arrival  Transfers Overall transfer level: Needs assistance Equipment used: Rolling walker (2 wheeled) Transfers: Sit to/from Stand Sit to Stand: Min guard              Balance Overall balance assessment: Needs assistance   Sitting balance-Leahy Scale: Good     Standing balance support: Bilateral upper extremity supported Standing balance-Leahy Scale: Poor Standing balance comment: min guard with walker, min in static standing without UE support                           ADL either performed or assessed with clinical judgement   ADL Overall ADL's : Needs assistance/impaired     Grooming: Wash/dry hands;Wash/dry face;Oral care;Sitting;Set up;Standing;Minimal assistance Grooming Details (indicate cue type and reason): fatigued and only able to perform one activity in standing         Upper Body Dressing : Set  up;Sitting                   Functional mobility during ADLs: Min guard;Rolling walker       Vision       Perception     Praxis      Cognition Arousal/Alertness: Awake/alert Behavior During Therapy: WFL for tasks assessed/performed Overall Cognitive Status: No family/caregiver present to determine baseline cognitive functioning                                 General Comments: pt somewhat demanding, does not appear to appreciate the importance of low sodium diet and weighing himself daily        Exercises     Shoulder Instructions       General Comments      Pertinent Vitals/ Pain       Pain Assessment: No/denies pain  Home Living                                          Prior Functioning/Environment              Frequency  Min 2X/week        Progress Toward Goals  OT Goals(current goals can now be found in the care plan section)  Progress  towards OT goals: Progressing toward goals  Acute Rehab OT Goals Patient Stated Goal: To return to independent mobility OT Goal Formulation: With patient Time For Goal Achievement: 01/11/20 Potential to Achieve Goals: Good  Plan Discharge plan remains appropriate    Co-evaluation                 AM-PAC OT "6 Clicks" Daily Activity     Outcome Measure   Help from another person eating meals?: None Help from another person taking care of personal grooming?: A Little Help from another person toileting, which includes using toliet, bedpan, or urinal?: A Little Help from another person bathing (including washing, rinsing, drying)?: A Lot Help from another person to put on and taking off regular upper body clothing?: A Little Help from another person to put on and taking off regular lower body clothing?: A Lot 6 Click Score: 17    End of Session Equipment Utilized During Treatment: Rolling walker;Gait belt;Oxygen  OT Visit Diagnosis: Unsteadiness on feet  (R26.81);Other abnormalities of gait and mobility (R26.89);Muscle weakness (generalized) (M62.81)   Activity Tolerance Patient tolerated treatment well   Patient Left in chair;with call bell/phone within reach   Nurse Communication          Time: 0141-0301 OT Time Calculation (min): 21 min  Charges: OT General Charges $OT Visit: 1 Visit OT Treatments $Self Care/Home Management : 8-22 mins  Nestor Lewandowsky, OTR/L Acute Rehabilitation Services Pager: [phone] Office: [phone]   Malka So 01/02/2020, 1:05 PM

## 2020-01-02 NOTE — Progress Notes (Addendum)
Engineer, maintenance [facility]) Hospital Liaison note.   Received request from [place], Pricilla Riffle for family interest in [facility].   Attempted to contact family listed in [place] (son) and unable to leave a voice mail. Left message for Colin Broach (son) @  [phone].   [facility] is unable to offer a room today. Hospital Liaison will follow up tomorrow with [facility] manager and attempt to reach family again.   A Please do not hesitate to call with questions.   Thank you,  Farrel Gordon, RN, CCM  [place] (listed on AMION under Hospice and [facility] [place])  [phone]

## 2020-01-02 NOTE — Care Management Important Message (Signed)
Important Message  Patient Details  Name: Spencer Rice MRN: [id number] Date of Birth: [birth year]   Medicare Important Message Given:  Yes     Memory Argue 01/02/2020, 2:30 PM

## 2020-01-02 NOTE — Progress Notes (Addendum)
Advanced Heart Failure Rounding Note  PCP-Cardiologist: No primary care provider on file.   Subjective:    Dobutamine discontinued 1/26. Remains on milrinone 0.5 mcg. CO-OX 50%. CVP 17  Yesterday lasix drip was increased to 30 mg per hour + 5 mg metolazone. Weight down another pound.   Feels weak Denies SOB.      Objective:   Weight Range: 76.1 kg Body mass index is 22.13 kg/m.   Vital Signs:   Temp:  [98 F (36.7 C)-98.3 F (36.8 C)] 98.3 F (36.8 C) (01/29 0355) Pulse Rate:  [77-90] 77 (01/28 2330) Resp:  [14-20] 14 (01/29 0355) BP: (81-99)/(52-80) 90/59 (01/29 0355) SpO2:  [97 %-100 %] 100 % (01/29 0355) Weight:  [76.1 kg] 76.1 kg (01/29 0635) Last BM Date: 01/01/20  Weight change: Filed Weights   12/31/19 0622 01/01/20 0446 01/02/20 0635  Weight: 77.2 kg 76.6 kg 76.1 kg    Intake/Output:   Intake/Output Summary (Last 24 hours) at 01/02/2020 0914 Last data filed at 01/02/2020 8676 Gross per 24 hour  Intake 1625.31 ml  Output 2200 ml  Net -574.69 ml      Physical Exam  CVP 13 General:  Sitting in the chair.  No resp difficulty HEENT: normal Neck: supple. JVP 11-12 . Carotids 2+ bilat; no bruits. No lymphadenopathy or thryomegaly appreciated. Cor: PMI nondisplaced. Regular rate & rhythm. No rubs,or murmurs. +S3  Lungs: clear Abdomen: soft, nontender, nondistended. No hepatosplenomegaly. No bruits or masses. Good bowel sounds. Extremities: no cyanosis, clubbing, rash, R and LLE unna boots. 1+ edema above unna boots.  Neuro: alert & orientedx3, cranial nerves grossly intact. moves all 4 extremities w/o difficulty. Affect pleasant    Telemetry   V paced 70s. Personally reviewed.   EKG   n/a   Labs    CBC No results for input(s): WBC, NEUTROABS, HGB, HCT, MCV, PLT in the last 72 hours. Basic Metabolic Panel Recent Labs    01/01/20 0354 01/02/20 0404  NA 129* 129*  K 3.9 3.0*  CL 82* 80*  CO2 34* 36*  GLUCOSE 115* 113*  BUN 52* 53*    CREATININE 2.25* 2.41*  CALCIUM 8.7* 8.8*  MG  --  2.3   Liver Function Tests No results for input(s): AST, ALT, ALKPHOS, BILITOT, PROT, ALBUMIN in the last 72 hours. No results for input(s): LIPASE, AMYLASE in the last 72 hours. Cardiac Enzymes No results for input(s): CKTOTAL, CKMB, CKMBINDEX, TROPONINI in the last 72 hours.  BNP: BNP (last 3 results) Recent Labs    08/21/19 2013 10/23/19 1048 12/24/19 1657  BNP 2,515.0* 3,200.7* 2,727.7*    ProBNP (last 3 results) No results for input(s): PROBNP in the last 8760 hours.   D-Dimer No results for input(s): DDIMER in the last 72 hours. Hemoglobin A1C No results for input(s): HGBA1C in the last 72 hours. Fasting Lipid Panel No results for input(s): CHOL, HDL, LDLCALC, TRIG, CHOLHDL, LDLDIRECT in the last 72 hours. Thyroid Function Tests No results for input(s): TSH, T4TOTAL, T3FREE, THYROIDAB in the last 72 hours.  Invalid input(s): FREET3  Other results:   Imaging    No results found.   Medications:     Scheduled Medications: . amiodarone  200 mg Oral Daily  . apixaban  5 mg Oral BID  . Chlorhexidine Gluconate Cloth  6 each Topical Daily  . docusate sodium  100 mg Oral Daily  . lactobacillus  1 g Oral TID WC  . metolazone  5 mg Oral Daily  .  pantoprazole  40 mg Oral Daily  . polyethylene glycol  17 g Oral Daily  . potassium chloride  40 mEq Oral BID  . sodium chloride flush  10-40 mL Intracatheter Q12H  . sucralfate  1 g Oral TID WC & HS  . tamsulosin  0.4 mg Oral QPC supper    Infusions: . furosemide (LASIX) infusion 30 mg/hr (01/02/20 0254)  . milrinone 0.5 mcg/kg/min (01/02/20 0530)    PRN Medications: acetaminophen, alum & mag hydroxide-simeth, calcium carbonate, guaiFENesin-dextromethorphan, hydrocortisone cream, loperamide, magnesium hydroxide, sodium chloride flush     Assessment/Plan   1. 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 [place] 2015 with moderate disease. Has Biotronik BiV ICD. - ECHO 10/22/19 EF severely EF 5-10% with severe MR/TR, biatrial enlargement, RV with moderately reduced function  - On milrinone 0.5 mcg. Dobutamine 2.5 added 1/22 due to persistently low co-ox (30s-40s). Dobutamine discontinued 1/26 - Co-ox 50% . Continue milrinone 0.5 mcg.   - Continue lasix drip 30 mg per hour + 5 mg metolazone. Overall weight down 42 pounds.  - Suspect we are getting close. Will need to transition back to torsemide 100 mg twice a day with metolazone once a week.  - No bb with low output. No Arb/spiro/dig with elevated creatinine.  - continue w/ unna boots.   2. Urinary Retention  -He had foley catheter until last week.  - Urine output adequate. On flomax.   3. CKD Stage IIIb - Recent creatinine baseline 2.5-2.8  - SCr 2.2>2.4   4.  H/O RLE DVT -Continue eliquis 5 mg twice a day.   5. Hyponatremia - Na 126 -> 127->127-->125-->129->129  - likely hypervolemic hyponatremia  - may need tolvaptan  6. Hypokalemia - K 3.0  Supp K   7. DNR/DNI - Discussed code status. He confirms DNR/DNI. He wants to keep ICD on. He had GOLD DNR form with him.  -Active with Hospice services provided by [facility].       Length of Stay: [place], NP  01/02/2020, 9:14 AM  Advanced Heart Failure Team Pager [phone] (M-F; 7a - 4p)  Please contact [facility] for night-coverage after hours (4p -7a ) and weekends on [email or web address]  Patient seen and examined with the above-signed Advanced Practice Provider and/or Housestaff. I personally reviewed laboratory data, imaging studies and relevant notes. I independently examined the patient and formulated the important aspects of the plan. I have edited the note to reflect any of my changes or salient points. I have personally discussed the plan with the patient and/or family.   Volume status continues to get worse in setting of  severe biventricular failure. Has been on home milrinone but admitted with recurrent low output HF. We added dobutamine and were able to diurese 30 pounds. After dobutamine stopped, fluid continues to accumulate despite lasix gtt at 30. Co-ox now 49%.  I think he has reached end-stage. He has been at home with milrinone and Hospice. Now needs Hospice facility   General:  Weak appearing. No resp difficulty HEENT: normal Neck: supple. JVP to ear  Carotids 2+ bilat; no bruits. No lymphadenopathy or thryomegaly appreciated. Cor: PMI laterally displaced. Regular rate & rhythm. 2/6 TR + s3 Lungs: clear Abdomen: soft, nontender, nondistended. No hepatosplenomegaly. No bruits or masses. Good bowel sounds. Extremities: no cyanosis, clubbing, rash, 2+ edema + unna boots  Neuro: alert & orientedx3, cranial nerves grossly  intact. moves all 4 extremities w/o difficulty. Affect pleasant  I spoke to him and his daughter-in-law Romie Minus) by phone and are in agreement to consider residential Hospice. Will re-engage with Palliative Care team.   Glori Bickers, MD  3:56 PM

## 2020-01-02 NOTE — Plan of Care (Signed)

## 2020-01-03 DIAGNOSIS — Z515 Encounter for palliative care: Secondary | ICD-10-CM

## 2020-01-03 DIAGNOSIS — Z7189 Other specified counseling: Secondary | ICD-10-CM

## 2020-01-03 DIAGNOSIS — I509 Heart failure, unspecified: Secondary | ICD-10-CM

## 2020-01-03 LAB — COOXEMETRY PANEL
Carboxyhemoglobin: 1.9 % — ABNORMAL HIGH (ref 0.5–1.5)
Methemoglobin: 0.9 % (ref 0.0–1.5)
O2 Saturation: 49.2 %
Total hemoglobin: 8.1 g/dL — ABNORMAL LOW (ref 12.0–16.0)

## 2020-01-03 LAB — BASIC METABOLIC PANEL
Anion gap: 13 (ref 5–15)
BUN: 58 mg/dL — ABNORMAL HIGH (ref 8–23)
CO2: 37 mmol/L — ABNORMAL HIGH (ref 22–32)
Calcium: 8.7 mg/dL — ABNORMAL LOW (ref 8.9–10.3)
Chloride: 77 mmol/L — ABNORMAL LOW (ref 98–111)
Creatinine, Ser: 2.44 mg/dL — ABNORMAL HIGH (ref 0.61–1.24)
GFR calc Af Amer: 28 mL/min — ABNORMAL LOW (ref >60)
GFR calc non Af Amer: 24 mL/min — ABNORMAL LOW (ref >60)
Glucose, Bld: 129 mg/dL — ABNORMAL HIGH (ref 70–99)
Potassium: 3 mmol/L — ABNORMAL LOW (ref 3.5–5.1)
Sodium: 127 mmol/L — ABNORMAL LOW (ref 135–145)

## 2020-01-03 MED ORDER — TORSEMIDE 20 MG PO TABS
100.0000 mg | ORAL_TABLET | Freq: Two times a day (BID) | ORAL | Status: DC
  Administered 2020-01-03 – 2020-01-05 (×4): 100 mg via ORAL
  Filled 2020-01-03 (×4): qty 5

## 2020-01-03 MED ORDER — POTASSIUM CHLORIDE CRYS ER 20 MEQ PO TBCR
40.0000 meq | EXTENDED_RELEASE_TABLET | Freq: Once | ORAL | Status: AC
  Administered 2020-01-03: 40 meq via ORAL
  Filled 2020-01-03: qty 2

## 2020-01-03 NOTE — Progress Notes (Signed)
[place] by phone with patient's son Alastor to acknowledge referral and offer to explain services. [place] confirms interest and reports family is working together to make decisions. He is speaking with his siblings later today and over the weekend. [place] will call back with additional questions or decision. Will continue to follow and be available to answer questions and continue process.   Thank you,  Erling Conte, LCSW [phone]  [facility] liaisons are listed daily on AMION under Hospice and [place]

## 2020-01-03 NOTE — Progress Notes (Signed)
Patient ID: Spencer Rice, male   DOB: February 24, 1941, 79 y.o.   MRN: [id number]     Advanced Heart Failure Rounding Note  PCP-Cardiologist: No primary care provider on file.   Subjective:    Dobutamine discontinued 1/26. Remains on milrinone 0.5 mcg/kg/min. CO-OX 49%, has been stably low. CVP down to 8 today.   He has been on Lasix gtt 30 mg per hour + 5 mg metolazone. Weight stable.   No dyspnea at rest.  Weak.      Objective:   Weight Range: 76 kg Body mass index is 22.11 kg/m.   Vital Signs:   Temp:  [97.5 F (36.4 C)-98.2 F (36.8 C)] 98.2 F (36.8 C) (01/30 0834) Pulse Rate:  [70-84] 72 (01/30 0834) Resp:  [13-20] 20 (01/30 0834) BP: (70-90)/(50-65) 86/60 (01/30 0834) SpO2:  [97 %-100 %] 100 % (01/30 0834) Weight:  [76 kg] 76 kg (01/30 0310) Last BM Date: 01/02/20  Weight change: Filed Weights   01/01/20 0446 01/02/20 0635 01/03/20 0310  Weight: 76.6 kg 76.1 kg 76 kg    Intake/Output:   Intake/Output Summary (Last 24 hours) at 01/03/2020 1008 Last data filed at 01/03/2020 0900 Gross per 24 hour  Intake 1900.95 ml  Output 1700 ml  Net 200.95 ml      Physical Exam  CVP 8 General: NAD Neck: No JVD, no thyromegaly or thyroid nodule.  Lungs: Clear to auscultation bilaterally with normal respiratory effort. CV: Lateral PMI.  Heart regular S1/S2, no S3/S4, no murmur.  2+ edema to knees with lower legs wrapped.   Abdomen: Soft, nontender, no hepatosplenomegaly, no distention.  Skin: Intact without lesions or rashes.  Neurologic: Lethargic but answers questions.  Psych: Flat affect. Extremities: No clubbing or cyanosis.  HEENT: Normal.    Telemetry   V paced 70s. Personally reviewed.   EKG   n/a   Labs    CBC No results for input(s): WBC, NEUTROABS, HGB, HCT, MCV, PLT in the last 72 hours. Basic Metabolic Panel Recent Labs    01/02/20 0404 01/03/20 0310  NA 129* 127*  K 3.0* 3.0*  CL 80* 77*  CO2 36* 37*  GLUCOSE 113* 129*  BUN 53* 58*    CREATININE 2.41* 2.44*  CALCIUM 8.8* 8.7*  MG 2.3  --    Liver Function Tests No results for input(s): AST, ALT, ALKPHOS, BILITOT, PROT, ALBUMIN in the last 72 hours. No results for input(s): LIPASE, AMYLASE in the last 72 hours. Cardiac Enzymes No results for input(s): CKTOTAL, CKMB, CKMBINDEX, TROPONINI in the last 72 hours.  BNP: BNP (last 3 results) Recent Labs    08/21/19 2013 10/23/19 1048 12/24/19 1657  BNP 2,515.0* 3,200.7* 2,727.7*    ProBNP (last 3 results) No results for input(s): PROBNP in the last 8760 hours.   D-Dimer No results for input(s): DDIMER in the last 72 hours. Hemoglobin A1C No results for input(s): HGBA1C in the last 72 hours. Fasting Lipid Panel No results for input(s): CHOL, HDL, LDLCALC, TRIG, CHOLHDL, LDLDIRECT in the last 72 hours. Thyroid Function Tests No results for input(s): TSH, T4TOTAL, T3FREE, THYROIDAB in the last 72 hours.  Invalid input(s): FREET3  Other results:   Imaging    No results found.   Medications:     Scheduled Medications: . amiodarone  200 mg Oral Daily  . apixaban  5 mg Oral BID  . Chlorhexidine Gluconate Cloth  6 each Topical Daily  . docusate sodium  100 mg Oral Daily  . lactobacillus  1 g Oral TID WC  . pantoprazole  40 mg Oral Daily  . polyethylene glycol  17 g Oral Daily  . potassium chloride  40 mEq Oral BID  . potassium chloride  40 mEq Oral Once  . sodium chloride flush  10-40 mL Intracatheter Q12H  . sucralfate  1 g Oral TID WC & HS  . tamsulosin  0.4 mg Oral QPC supper  . torsemide  100 mg Oral BID    Infusions: . milrinone 0.5 mcg/kg/min (01/03/20 0900)    PRN Medications: acetaminophen, alum & mag hydroxide-simeth, calcium carbonate, guaiFENesin-dextromethorphan, hydrocortisone cream, loperamide, magnesium hydroxide, sodium chloride flush     Assessment/Plan   1. 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 [place] 2015 with moderate disease. Has Biotronik BiV ICD.  End stage cardiomyopathy.  - ECHO 10/22/19 EF severely EF 5-10% with severe MR/TR, biatrial enlargement, RV with moderately reduced function  - On milrinone 0.5 mcg. Dobutamine 2.5 added 1/22 due to persistently low co-ox (30s-40s). Dobutamine discontinued 1/26 - Co-ox 49%, stably low. Continue milrinone 0.5 mcg/kg/min but anticipate stopping when discharged to hospice house.   - CVP 8, stop Lasix gtt and daily metolazone.   Start torsemide 100 mg bid with metolazone once a week. - No bb with low output. No Arb/spiro/dig with elevated creatinine.  - continue w/ unna boots.   2. Urinary Retention  - He had foley catheter until last week.  - Urine output adequate. On Flomax.   3. CKD Stage IIIb - Recent creatinine baseline 2.5-2.8  - SCr 2.2>2.4>2.44   4.  H/O RLE DVT - Continue eliquis 5 mg twice a day.   5. Hyponatremia - Na 126 -> 127->127-->125-->129->129->127 - likely hypervolemic hyponatremia   6. Hypokalemia - K 3.0  - Supp K   7. DNR/DNI - Discussed code status. He confirms DNR/DNI. He wants to keep ICD on. He had GOLD DNR form with him.  - Hospice working on bed in hospice facility.  He can go when bed available.  Would stop milrinone at that time.    Length of Stay: [place], MD  01/03/2020, 10:08 AM  Advanced Heart Failure Team Pager [phone] (M-F; 7a - 4p)  Please contact [facility] for night-coverage after hours (4p -7a ) and weekends on [email or web address]

## 2020-01-03 NOTE — Plan of Care (Signed)

## 2020-01-03 NOTE — Consult Note (Addendum)
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   Palliative care consult requested to address goals of care given end stage heart failure. Had been seen by the Palliative care team in November and February of 2020.  Clinical Assessment and Goals of Care: I have reviewed medical records including EPIC notes, labs and imaging, and  assessed the patient. Patient was sitting in the chair self suctioning. I asked how he was feeling, he vocalizing "alright". I asked him if something was bothersome to him and he stated that his heart is not functioning well. I asked him what he meant by that. He said that he knows his time is limited per what the doctors told him today. I asked him how he felt about that, he shrugged. He stated that "it is his time when it is his time."   We further discussed present diagnosis prognosis, GOC, EOL wishes, disposition and options.  I introduced Palliative Medicine as specialized medical care for people living with serious illness. It focuses on providing relief from the symptoms and stress of a serious illness. The goal is to improve quality of life for both the patient and the family.  Mr. Villard was raised in a small town in [place]. He lives in [place] with his son, Jakeb who helps him with some bADLs. The patient said that for the most part he can pick up some things around the house and sometimes help with cooking. He  no longer drives. Much of the day is spend in the chair due to difficulty breathing. In June of 2020 he suffered the loss of his wife with whom he shared three sons and one daughter. He was Chemical engineer by trade. He is [employer] and is a member of the [employer] in [place].  I asked Mr. Vangorder what his goals for the future are. He said that firstly he wants to be out of the hospital. He stated that he would ideally like to be back home but realizes that this might not be a possibility because of his care needs. He mentioned that his family is looking into a hospice home. I asked him if he had ever heard of a hospice home before or if he had any questions about a hospice home. He said that he "has seen people there, they just go away." I asked him if he has any concerns about the end of his life which he stated no to.   We talked about chest compression and intubation which he agrees that he  would not desire at this point in time. He would want to allow for a natural death.   Mr. Hosie said that presently he plans to talk with his other family members regarding next steps. I told him that I would plan to check in with him on the days he is in the hospital. He was agreeable to that.   Discussed with patient the importance of continued conversation with family and their medical providers regarding overall plan of care and treatment options, ensuring decisions are within the context of the patients values and GOCs.  Decision Maker: [name] (Self)  SUMMARY OF RECOMMENDATIONS   DNR/DNI  Likely transition to hospice home ([facility]), family plans on meeting regarding this decision  Palliative Care will check in with patient and family tomorrow   Code Status/Advance Care Planning:  DNR  Symptom Management:   Per primary  Palliative Prophylaxis:   Aspiration, Bowel Regimen, Delirium Protocol, Eye Care, Frequent Pain Assessment, Oral Care, Palliative  Wound Care and Turn Reposition  Additional Recommendations (Limitations, Scope, Preferences):  Minimize Medications  Psycho-social/Spiritual:   Desire for further Chaplaincy support: Yes  Additional Recommendations: Caregiving  Support/Resources and Education on Hospice  Prognosis:   Poor prognosis without milrinone support in the setting of end stage heart failure.   Discharge Planning:  Either home with hospice or to a hospice facility. The patient and his family are discussing this.     Primary Diagnoses: Present on Admission: . Acute on chronic systolic (congestive) heart failure ([facility])  I have reviewed the medical record, interviewed the patient and family, and examined the patient. The following aspects are pertinent.  Past Medical History:  Diagnosis Date  . AICD (automatic cardioverter/defibrillator) present   . Cancer of left kidney ([facility])    S/P OR 05/2014  . CHF (congestive heart failure) ([facility])   . CKD (chronic kidney disease)   . 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 Coronary artery disease   . DVT (deep venous thrombosis) ([facility]) 1983   LLE  . GERD (gastroesophageal reflux disease)   . History of hiatal hernia   . Hypertension    Social History   Socioeconomic History  . Marital status: Married    Spouse name: Not on file  . Number of children: Not on file  . Years of education: Not on file  . Highest education level: Not on file  Occupational History  . Not on file  Tobacco Use  . Smoking status: Former Smoker    Packs/day: 0.75    Years: 17.00    Pack years: 12.75    Types: Cigarettes    Start date: 05/08/1959    Quit date: 06/20/1976    Years since quitting: 43.5  . Smokeless tobacco: Never Used  Substance and Sexual Activity  . Alcohol use: No  . Drug use: No  . Sexual activity: Not on file  Other Topics Concern  . Not on file  Social History Narrative  . Not on file   Social Determinants of Health    Financial Resource Strain:   . Difficulty of Paying Living Expenses: Not on file  Food Insecurity:   . Worried About Charity fundraiser in the Last Year: Not on file  . Ran Out of Food in the Last Year: Not on file  Transportation Needs:   . Lack of Transportation (Medical): Not on file  . Lack of Transportation (Non-Medical): Not on file  Physical Activity:   .  Days of Exercise per Week: Not on file  . Minutes of Exercise per Session: Not on file  Stress:   . Feeling of Stress : Not on file  Social Connections:   . Frequency of Communication with Friends and Family: Not on file  . Frequency of Social Gatherings with Friends and Family: Not on file  . Attends Religious Services: Not on file  . Active Member of Clubs or Organizations: Not on file  . Attends Archivist Meetings: Not on file  . Marital Status: Not on file   Family History  Problem Relation Age of Onset  . Heart disease Father   . Heart attack Father 76  . Hypertension Father   . Hypertension Mother    Scheduled Meds: . amiodarone  200 mg Oral Daily  . apixaban  5 mg Oral BID  . Chlorhexidine Gluconate Cloth  6 each Topical Daily  . docusate sodium  100 mg Oral Daily  . lactobacillus  1 g Oral TID WC  . pantoprazole  40 mg Oral Daily  . polyethylene glycol  17 g Oral Daily  . potassium chloride  40 mEq Oral BID  . sodium chloride flush  10-40 mL Intracatheter Q12H  . sucralfate  1 g Oral TID WC & HS  . tamsulosin  0.4 mg Oral QPC supper  . torsemide  100 mg Oral BID   Continuous Infusions: . milrinone 0.5 mcg/kg/min (01/03/20 1600)   PRN Meds:.acetaminophen, alum & mag hydroxide-simeth, calcium carbonate, guaiFENesin-dextromethorphan, hydrocortisone cream, loperamide, magnesium hydroxide, sodium chloride flush Medications Prior to Admission:  Prior to Admission medications   Medication Sig Start Date End Date Taking? Authorizing Provider  acetaminophen (TYLENOL) 325 MG tablet Take 2 tablets  (650 mg total) by mouth every 4 (four) hours as needed for headache or mild pain. 01/29/19  Yes Shirley Friar, PA-C  apixaban (ELIQUIS) 5 MG TABS tablet Take 1 tablet (5 mg total) by mouth 2 (two) times daily. 10/30/19  Yes Donne Hazel, MD  docusate sodium (COLACE) 100 MG capsule Take 100 mg by mouth every other day.   Yes [provider]  metolazone (ZAROXOLYN) 2.5 MG tablet Take 1 tablet (2.5 mg total) by mouth 2 (two) times a week. Every Thursay and Sunday 10/30/19 01/28/20 Yes Donne Hazel, MD  mometasone-formoterol [facility]) 200-5 MCG/ACT AERO Inhale 2 puffs into the lungs 2 (two) times daily. 02/14/19  Yes Hennie Duos, MD  OXYGEN Inhale 2 L into the lungs continuous.   Yes [provider]  pantoprazole (PROTONIX) 40 MG tablet Take 1 tablet (40 mg total) by mouth daily. 08/23/19  Yes Strader, [place] M, PA-C  polyethylene glycol (MIRALAX / GLYCOLAX) packet Take 17 g by mouth daily. Patient taking differently: Take 17 g by mouth daily as needed for mild constipation.  02/14/19  Yes Hennie Duos, MD  potassium chloride SA (KLOR-CON M20) 20 MEQ tablet Take 2 tablets (40 mEq total) by mouth 2 (two) times daily. Patient taking differently: Take 40 mEq by mouth daily.  09/23/19  Yes Bensimhon, Shaune Pascal, MD  silodosin (RAPAFLO) 8 MG CAPS capsule Take 8 mg by mouth daily. 10/09/19  Yes [provider]  sucralfate (CARAFATE) 1 g tablet Take 1 tablet (1 g total) by mouth 4 (four) times daily -  with meals and at bedtime. Patient taking differently: Take 2 g by mouth 2 (two) times daily.  02/14/19  Yes Hennie Duos, MD  torsemide (DEMADEX) 20 MG tablet  Take 3 tablets (60 mg total) by mouth 2 (two) times daily. 10/29/19  Yes Donne Hazel, MD  amiodarone (PACERONE) 200 MG tablet TAKE 1 TABLET BY MOUTH EVERY DAY 12/29/19   Bensimhon, Shaune Pascal, MD  lactobacillus (FLORANEX/LACTINEX) PACK Take 1 packet (1 g total) by mouth 3 (three) times daily with  meals. Patient not taking: Reported on 10/24/2019 02/14/19   Hennie Duos, MD  milrinone [facility]) 20 MG/100 ML SOLN infusion Inject 0.0388 mg/min into the vein continuous. PER [facility] Patient not taking: Reported on 12/24/2019 10/29/19   Donne Hazel, MD  tamsulosin (FLOMAX) 0.4 MG CAPS capsule Take 2 capsules (0.8 mg total) by mouth daily after supper. Patient not taking: Reported on 10/24/2019 02/14/19   Hennie Duos, MD   No Known Allergies Review of Systems  Constitutional: Positive for activity change.  HENT: Positive for drooling.   Respiratory: Positive for shortness of breath.    Physical Exam Vitals and nursing note reviewed.  HENT:     Head: Normocephalic.     Nose: Nose normal.     Mouth/Throat:     Mouth: Mucous membranes are moist.  Eyes:     Pupils: Pupils are equal, round, and reactive to light.  Cardiovascular:     Rate and Rhythm: Normal rate.     Pulses: Normal pulses.  Pulmonary:     Effort: Pulmonary effort is normal.  Abdominal:     General: Abdomen is flat.  Musculoskeletal:        General: Normal range of motion.     Cervical back: Normal range of motion.  Skin:    General: Skin is warm and dry.     Capillary Refill: Capillary refill takes less than 2 seconds.  Neurological:     Mental Status: He is alert and oriented to person, place, and time.  Psychiatric:        Mood and Affect: Mood normal.     Vital Signs: BP (!) 153/128 (BP Location: Right Arm)   Pulse 90   Temp 98.2 F (36.8 C) (Oral)   Resp 20   Ht 6\' 1"  (1.854 m)   Wt 76 kg   SpO2 100%   BMI 22.11 kg/m  Pain Scale: 0-10 POSS *See Group Information*: S-Acceptable,Sleep, easy to arouse Pain Score: 0-No pain   SpO2: SpO2: 100 % O2 Device:SpO2: 100 % O2 Flow Rate: .O2 Flow Rate (L/min): 2 L/min  IO: Intake/output summary:   Intake/Output Summary (Last 24 hours) at 01/03/2020 1735 Last data filed at 01/03/2020 1600 Gross per 24 hour  Intake 1806.44 ml   Output 1150 ml  Net 656.44 ml   LBM: Last BM Date: 01/02/20 Baseline Weight: Weight: 78.5 kg Most recent weight: Weight: 76 kg     Palliative Assessment/Data: 40%  Time In: 1640 Time Out: 1750 Time Total: 70 Greater than 50%  of this time was spent counseling and coordinating care related to the above assessment and plan.  Signed by: Rosezella Rumpf, NP   Please contact Palliative Medicine Team phone at [phone] for questions and concerns.  For individual provider: See Shea Evans

## 2020-01-03 NOTE — Progress Notes (Signed)
Orthopedic Tech Progress Note Patient Details:  Spencer Rice 1940-12-08 [id number] RN was busy and was not able to get to the removal of UNNA BOOTS so I removed them, clean legs and had RN to come a take a look at them once the legs were clean.. then reapplied new unna boots to patient legs Ortho Devices Type of Ortho Device: Unna boot, Ace wrap Ortho Device/Splint Location: bilateral legs Ortho Device/Splint Interventions: Removal, Ordered, Application   Post Interventions Patient Tolerated: Well Instructions Provided: Care of device, Adjustment of device   [name] 01/03/2020, 11:31 AM

## 2020-01-04 DIAGNOSIS — Z66 Do not resuscitate: Secondary | ICD-10-CM

## 2020-01-04 LAB — BASIC METABOLIC PANEL
Anion gap: 15 (ref 5–15)
BUN: 57 mg/dL — ABNORMAL HIGH (ref 8–23)
CO2: 36 mmol/L — ABNORMAL HIGH (ref 22–32)
Calcium: 9.1 mg/dL (ref 8.9–10.3)
Chloride: 79 mmol/L — ABNORMAL LOW (ref 98–111)
Creatinine, Ser: 2.35 mg/dL — ABNORMAL HIGH (ref 0.61–1.24)
GFR calc Af Amer: 30 mL/min — ABNORMAL LOW (ref >60)
GFR calc non Af Amer: 26 mL/min — ABNORMAL LOW (ref >60)
Glucose, Bld: 125 mg/dL — ABNORMAL HIGH (ref 70–99)
Potassium: 3.5 mmol/L (ref 3.5–5.1)
Sodium: 130 mmol/L — ABNORMAL LOW (ref 135–145)

## 2020-01-04 LAB — COOXEMETRY PANEL
Carboxyhemoglobin: 1.7 % — ABNORMAL HIGH (ref 0.5–1.5)
Methemoglobin: 1.3 % (ref 0.0–1.5)
O2 Saturation: 47.8 %
Total hemoglobin: 8.1 g/dL — ABNORMAL LOW (ref 12.0–16.0)

## 2020-01-04 MED ORDER — POTASSIUM CHLORIDE CRYS ER 20 MEQ PO TBCR
20.0000 meq | EXTENDED_RELEASE_TABLET | Freq: Once | ORAL | Status: AC
  Administered 2020-01-04: 20 meq via ORAL
  Filled 2020-01-04: qty 1

## 2020-01-04 MED ORDER — METOLAZONE 2.5 MG PO TABS
2.5000 mg | ORAL_TABLET | Freq: Once | ORAL | Status: AC
  Administered 2020-01-04: 10:00:00 2.5 mg via ORAL
  Filled 2020-01-04: qty 1

## 2020-01-04 NOTE — Plan of Care (Signed)

## 2020-01-04 NOTE — Progress Notes (Signed)
Palliative Medicine Inpatient Follow Up Note   HPI: Per H&P --> Spencer Rice is a 79 year old with a history ofend-stage systolic HF due to NICM EF 10% s/p Biotronik CRT-D, severe Spencer/TR,LBBB, CKD stage 3 s/p previous ablation of kidney C(1.8-2.4), COPD,DMII and recent RP bleed.  Palliative care consult requested to address goals of care given end stage heart failure. Had been seen by the Palliative care team in November and February of 2020.  Today's Discussion (01/04/2020): Chart reviewed. Spoke to bedside RN. Patient endorses that he slept poorly overnight. I asked him if he had any questions regarding our conversation on the night prior which he said he did not. Presently he is waiting to speak with his family regarding transition to home with hospice or a hospice home. Per review of cardiology notes the plan will be to turn off the milrinone drip. We continue to discuss continuation or deactivation of the AICD which he has in place.   Discussed with patient the importance of continued conversation with family and their  medical providers regarding overall plan of care and treatment options, ensuring decisions are within the context of the patients values and GOCs.  Questions and concerns addressed   Vital Signs Vitals:   01/04/20 0751 01/04/20 0803  BP:  (!) 82/49  Pulse:    Resp: 16 15  Temp: 98.3 F (36.8 C)   SpO2: 100%     Intake/Output Summary (Last 24 hours) at 01/04/2020 0948 Last data filed at 01/04/2020 7169 Gross per 24 hour  Intake 908.83 ml  Output 700 ml  Net 208.83 ml   Last Weight  Most recent update: 01/04/2020  3:38 AM   Weight  76.3 kg (168 lb 3.4 oz)           Physical Exam Vitals and nursing note reviewed.  HENT:     Head: Normocephalic.     Nose: Nose normal.     Mouth/Throat:     Mouth: Mucous membranes are moist.  Eyes:     Pupils: Pupils are equal, round, and reactive to light.  Cardiovascular:     Rate and Rhythm: Normal rate.    Pulses: Normal pulses.  Pulmonary:     Effort: Pulmonary effort is normal.  Abdominal:     General: Abdomen is flat.  Musculoskeletal:        General: Normal range of motion.     Cervical back: Normal range of motion.  Skin:    General: Skin is warm and dry.     Capillary Refill: Capillary refill takes less than 2 seconds.  Neurological:     Mental Status: He is alert and oriented to person, place, and time.  Psychiatric:        Mood and Affect: Mood normal.   SUMMARY OF RECOMMENDATIONS   DNR/DNI  Likely transition to hospice home ([facility]) versus home with hospice (prior on [facility]), family plans on meeting regarding this decision  Palliative Care left a message for patients son Spencer Rice  Patient still deciding whether or not to discontinue AICD   Time Spent: 25 Greater than 50% of the time was spent in counseling and coordination of care ______________________________________________________________________________________ [place] Team Team Cell Phone: [phone] Please utilize secure chat with additional questions, if there is no response within 30 minutes please call the above phone number  Palliative Medicine Team providers are available by phone from 7am to 7pm daily and can be reached through the team  cell phone.  Should this patient require assistance outside of these hours, please call the patient's attending physician.

## 2020-01-04 NOTE — Progress Notes (Signed)
   Palliative Medicine Inpatient Follow Up Note  Addendum to Daily Progress Note:  Met at bedside with patients son, Adrean and Mr. Nawrot. They had just completed a conference call with their other family members. They have decided to proceed with placement at [facility].   The patient has agreed to deactivation of his AICD.   An order has been placed for this and the nursing staff alerted.   Time Spent: 15 Greater than 50% of the time was spent in counseling and coordination of care ______________________________________________________________________________________ [place] Team Team Cell Phone: [phone] Please utilize secure chat with additional questions, if there is no response within 30 minutes please call the above phone number  Palliative Medicine Team providers are available by phone from 7am to 7pm daily and can be reached through the team cell phone.  Should this patient require assistance outside of these hours, please call the patient's attending physician.

## 2020-01-04 NOTE — Progress Notes (Signed)
Patient ID: Spencer Rice, male   DOB: 10-16-1941, 79 y.o.   MRN: [id number]     Advanced Heart Failure Rounding Note  PCP-Cardiologist: No primary care provider on file.   Subjective:    Dobutamine discontinued 1/26. Remains on milrinone 0.5 mcg/kg/min. CO-OX 48%, has been stably low. CVP higher at 14 today.   He is currently on torsemide 100 mg bid.  Weight up 1 lb.    No dyspnea at rest.  Weak.      Objective:   Weight Range: 76.3 kg Body mass index is 22.19 kg/m.   Vital Signs:   Temp:  [97.3 F (36.3 C)-98.3 F (36.8 C)] 98.3 F (36.8 C) (01/31 0751) Pulse Rate:  [79-94] 82 (01/31 0338) Resp:  [15-23] 15 (01/31 0803) BP: (80-153)/(49-128) 82/49 (01/31 0803) SpO2:  [100 %] 100 % (01/31 0751) Weight:  [76.3 kg] 76.3 kg (01/31 0338) Last BM Date: 01/02/20  Weight change: Filed Weights   01/02/20 0635 01/03/20 0310 01/04/20 0338  Weight: 76.1 kg 76 kg 76.3 kg    Intake/Output:   Intake/Output Summary (Last 24 hours) at 01/04/2020 0936 Last data filed at 01/04/2020 8250 Gross per 24 hour  Intake 908.83 ml  Output 700 ml  Net 208.83 ml      Physical Exam  CVP 14 General: NAD, chronically ill-appearing Neck: JVP 12 cm, no thyromegaly or thyroid nodule.  Lungs: Clear to auscultation bilaterally with normal respiratory effort. CV: Lateral PMI.  Heart regular S1/S2, no S3/S4, no murmur.  1+ edema to knees (lower legs wrapped).   Abdomen: Soft, nontender, no hepatosplenomegaly, no distention.  Skin: Intact without lesions or rashes.  Neurologic: Alert and oriented x 3.  Psych: Normal affect. Extremities: No clubbing or cyanosis.  HEENT: Normal.    Telemetry   V paced 70s. Personally reviewed.   EKG   n/a   Labs    CBC No results for input(s): WBC, NEUTROABS, HGB, HCT, MCV, PLT in the last 72 hours. Basic Metabolic Panel Recent Labs    01/02/20 0404 01/02/20 0404 01/03/20 0310 01/04/20 0430  NA 129*   < > 127* 130*  K 3.0*   < > 3.0* 3.5    CL 80*   < > 77* 79*  CO2 36*   < > 37* 36*  GLUCOSE 113*   < > 129* 125*  BUN 53*   < > 58* 57*  CREATININE 2.41*   < > 2.44* 2.35*  CALCIUM 8.8*   < > 8.7* 9.1  MG 2.3  --   --   --    < > = values in this interval not displayed.   Liver Function Tests No results for input(s): AST, ALT, ALKPHOS, BILITOT, PROT, ALBUMIN in the last 72 hours. No results for input(s): LIPASE, AMYLASE in the last 72 hours. Cardiac Enzymes No results for input(s): CKTOTAL, CKMB, CKMBINDEX, TROPONINI in the last 72 hours.  BNP: BNP (last 3 results) Recent Labs    08/21/19 2013 10/23/19 1048 12/24/19 1657  BNP 2,515.0* 3,200.7* 2,727.7*    ProBNP (last 3 results) No results for input(s): PROBNP in the last 8760 hours.   D-Dimer No results for input(s): DDIMER in the last 72 hours. Hemoglobin A1C No results for input(s): HGBA1C in the last 72 hours. Fasting Lipid Panel No results for input(s): CHOL, HDL, LDLCALC, TRIG, CHOLHDL, LDLDIRECT in the last 72 hours. Thyroid Function Tests No results for input(s): TSH, T4TOTAL, T3FREE, THYROIDAB in the last 72 hours.  Invalid  input(s): FREET3  Other results:   Imaging    No results found.   Medications:     Scheduled Medications: . amiodarone  200 mg Oral Daily  . apixaban  5 mg Oral BID  . Chlorhexidine Gluconate Cloth  6 each Topical Daily  . docusate sodium  100 mg Oral Daily  . lactobacillus  1 g Oral TID WC  . metolazone  2.5 mg Oral Once  . pantoprazole  40 mg Oral Daily  . polyethylene glycol  17 g Oral Daily  . potassium chloride  20 mEq Oral Once  . potassium chloride  40 mEq Oral BID  . sodium chloride flush  10-40 mL Intracatheter Q12H  . sucralfate  1 g Oral TID WC & HS  . tamsulosin  0.4 mg Oral QPC supper  . torsemide  100 mg Oral BID    Infusions: . milrinone 0.5 mcg/kg/min (01/04/20 0635)    PRN Medications: acetaminophen, alum & mag hydroxide-simeth, calcium carbonate, guaiFENesin-dextromethorphan,  hydrocortisone cream, loperamide, magnesium hydroxide, sodium chloride flush     Assessment/Plan   1. 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 [place] 2015 with moderate disease. Has Biotronik BiV ICD.  End stage cardiomyopathy.  - ECHO 10/22/19 EF severely EF 5-10% with severe MR/TR, biatrial enlargement, RV with moderately reduced function  - On milrinone 0.5 mcg. Dobutamine 2.5 added 1/22 due to persistently low co-ox (30s-40s). Dobutamine discontinued 1/26 - Co-ox 48%, stably low. Continue milrinone 0.5 mcg/kg/min but anticipate stopping when discharged to hospice house.   - CVP 14, higher today. Continue torsemide 100 mg bid and will give a dose of metolazone 2.5 x 1 today. - No bb with low output. No Arb/spiro/dig with elevated creatinine.  - continue w/ unna boots.   2. Urinary Retention  - He had foley catheter until last week.  - Urine output adequate. On Flomax.   3. CKD Stage IIIb - Recent creatinine baseline 2.5-2.8  - SCr 2.2>2.4>2.44>2.35   4.  H/O RLE DVT - Continue eliquis 5 mg twice a day.   5. Hyponatremia - Na 126 -> 127->127-->125-->129->129->127->130 - likely hypervolemic hyponatremia   6. Hypokalemia - K 3.5 - Supp K   7. DNR/DNI - Discussed code status. He confirms DNR/DNI. He wants to keep ICD on. He had GOLD DNR form with him.  - Hospice working on bed in hospice facility.  He can go when bed available.  Would stop milrinone at that time.    Length of Stay: 17  Spencer Champagne, MD  01/04/2020, 9:36 AM  Advanced Heart Failure Team Pager [phone] (M-F; [place])  Please contact [facility] for night-coverage after hours (4p -7a ) and weekends on [email or web address]

## 2020-01-05 LAB — COOXEMETRY PANEL
Carboxyhemoglobin: 1.6 % — ABNORMAL HIGH (ref 0.5–1.5)
Methemoglobin: 0.6 % (ref 0.0–1.5)
O2 Saturation: 53.1 %
Total hemoglobin: 7.9 g/dL — ABNORMAL LOW (ref 12.0–16.0)

## 2020-01-05 LAB — BASIC METABOLIC PANEL
Anion gap: 14 (ref 5–15)
BUN: 59 mg/dL — ABNORMAL HIGH (ref 8–23)
CO2: 35 mmol/L — ABNORMAL HIGH (ref 22–32)
Calcium: 9 mg/dL (ref 8.9–10.3)
Chloride: 82 mmol/L — ABNORMAL LOW (ref 98–111)
Creatinine, Ser: 2.73 mg/dL — ABNORMAL HIGH (ref 0.61–1.24)
GFR calc Af Amer: 25 mL/min — ABNORMAL LOW (ref >60)
GFR calc non Af Amer: 21 mL/min — ABNORMAL LOW (ref >60)
Glucose, Bld: 117 mg/dL — ABNORMAL HIGH (ref 70–99)
Potassium: 3.4 mmol/L — ABNORMAL LOW (ref 3.5–5.1)
Sodium: 131 mmol/L — ABNORMAL LOW (ref 135–145)

## 2020-01-05 MED ORDER — ALUM & MAG HYDROXIDE-SIMETH 200-200-20 MG/5ML PO SUSP: 30.0000 mL | ORAL | 0 refills | Status: AC | PRN

## 2020-01-05 MED ORDER — POTASSIUM CHLORIDE CRYS ER 20 MEQ PO TBCR
20.0000 meq | EXTENDED_RELEASE_TABLET | Freq: Once | ORAL | Status: AC
  Administered 2020-01-05: 20 meq via ORAL
  Filled 2020-01-05: qty 1

## 2020-01-05 MED ORDER — TORSEMIDE 100 MG PO TABS: 100.0000 mg | ORAL_TABLET | Freq: Two times a day (BID) | ORAL | Status: AC

## 2020-01-05 NOTE — Plan of Care (Signed)

## 2020-01-05 NOTE — Progress Notes (Signed)
Rep from Biotronik at beside to deactivate ICD per MD order. Okey Regal. (Rep) stated ICD was already off per his interrogation. [place] parameters unchanged.   Pt resting comfortably in chair. Awaiting Biochemist, clinical for [employer] placement.

## 2020-01-05 NOTE — Progress Notes (Signed)
Physical Therapy Treatment Patient Details Name: Spencer Rice MRN: [id number] DOB: [birth year] Today's Date: 01/05/2020    History of Present Illness Pt is a 79 y.o. male admitted 12/24/19 with SOB, constipation and edema; worked up for volume overload secondary to CHF exacerbation; end stage cardiomyopathy. AICD deactivated; pt now planning to d/c to Hospice house. 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   PT Comments    Pt slowly progressing with mobility. Able to perform seated/standing transfers, ADL tasks and therex with min guard to supervision. Hypotensive throughout session, pt denies dizziness (see BP values below). SpO2 100% on 2L O2 [place]. Pt preparing for d/c with hospice today.   Seated/resting BP 84/62 Standing BP 78/57 Post-mobility BP 88/56    Follow Up Recommendations  Supervision/Assistance - 24 hour(Hospice house)     Equipment Recommendations  None recommended by PT    Recommendations for Other Services       Precautions / Restrictions Precautions Precautions: Fall;Other (comment) Precaution Comments: hypotensive Restrictions Weight Bearing Restrictions: No    Mobility  Bed Mobility               General bed mobility comments: Received sitting in recliner  Transfers Overall transfer level: Needs assistance Equipment used: Rolling walker (2 wheeled) Transfers: Sit to/from Stand Sit to Stand: Supervision         General transfer comment: Performed multiple sit<>stands from recliner to RW, heavy reliance on UE support to push into standing; supervision for safety. Hypotensive, denies dizziness  Ambulation/Gait Ambulation/Gait assistance: Supervision Gait Distance (Feet): 10 Feet Assistive device: Rolling walker (2 wheeled) Gait Pattern/deviations: Step-through pattern;Decreased stride length;Trunk flexed     General Gait Details: Slow, steady gait  with RW at supervision-level; deferred hallway ambulation secondary to hypotension. 2x bouts marching in place, seated rest after ~30-60sec of activity bouts   Stairs             Wheelchair Mobility    Modified Rankin (Stroke Patients Only)       Balance Overall balance assessment: Needs assistance   Sitting balance-Leahy Scale: Good     Standing balance support: No upper extremity supported;During functional activity Standing balance-Leahy Scale: Fair Standing balance comment: Able to static stand without UE support and perform posterior pericare at supervision-level; dynamic stability improved with UE support                            Cognition Arousal/Alertness: Awake/alert Behavior During Therapy: WFL for tasks assessed/performed;Flat affect Overall Cognitive Status: No family/caregiver present to determine baseline cognitive functioning                                 General Comments: Following commands appropriately; flat affect, difficult to assess      Exercises Other Exercises Other Exercises: Marching in place, pt able to tolerate ~30-60 sec bouts of standing activity before needing seated rest break secondary to fatigue    General Comments General comments (skin integrity, edema, etc.): SpO2 100% on 2L O2. Seated BP 85/62, standing BP 78/57, post-standing activity with return to sit BP 88/56      Pertinent Vitals/Pain Pain Assessment: No/denies pain    Home Living  Prior Function            PT Goals (current goals can now be found in the care plan section) Progress towards PT goals: Progressing toward goals    Frequency    Min 2X/week      PT Plan Frequency needs to be updated;Discharge plan needs to be updated    Co-evaluation              AM-PAC PT "6 Clicks" Mobility   Outcome Measure  Help needed turning from your back to your side while in a flat bed without using  bedrails?: A Little Help needed moving from lying on your back to sitting on the side of a flat bed without using bedrails?: A Little Help needed moving to and from a bed to a chair (including a wheelchair)?: None Help needed standing up from a chair using your arms (e.g., wheelchair or bedside chair)?: None Help needed to walk in hospital room?: A Little Help needed climbing 3-5 steps with a railing? : A Little 6 Click Score: 20    End of Session Equipment Utilized During Treatment: Oxygen Activity Tolerance: Patient tolerated treatment well;Patient limited by fatigue Patient left: in chair;with call bell/phone within reach Nurse Communication: Mobility status PT Visit Diagnosis: Muscle weakness (generalized) (M62.81);Unsteadiness on feet (R26.81)     Time: 5465-6812 PT Time Calculation (min) (ACUTE ONLY): 25 min  Charges:  $Therapeutic Exercise: 8-22 mins $Therapeutic Activity: 8-22 mins                    Mabeline Caras, PT, DPT Acute Rehabilitation Services  Pager [phone] Office [phone]  Derry Lory 01/05/2020, 1:10 PM

## 2020-01-05 NOTE — Discharge Summary (Addendum)
Advanced Heart Failure Team  Discharge Summary   Patient ID: Spencer Rice MRN: [id number], DOB/AGE: 79-Feb-1942 79 y.o. Admit date: 12/24/2019 D/C date:     01/05/2020   Primary Discharge Diagnoses:  1. A/C Biventricular systolic HF -> cardiogenic shock , End Stage on Milrinone 0.5 mcg at home 2.Urinary Retention  3. CKD Stage IIIb 4.H/O RLE DVT 5. Hyponatremia 6. 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   Admitted 1/26-2/26/2020 with cardiogenic shock.PICC placed 1/27 with initial CVP 26 coox 38% ->Milrinone started with improved diuresis and renal function. Echo 01/01/19 EF 10% with severe Spencer/TR, biatrial enlargement, RV with moderately reduced function. Taken for [facility] 01/07/19 with Mild, non-obstructive CAD, Moderate PAH with normal PAPI, and normal CO on milrinone 0.25 mcg/kg/min. PFTs 01/10/2001 FVC 2.28 (54%), FEV 2.0 (64%), DLCO 14.64 (53%). He was considered for  LVADbut had spontaneous RP bleed with symptomatic anemia complicating his admission. He was discharged on milrinone.   Admitted with A/C End Stage Biventricular HF and marked volume overload. Admitted on milrinone 0.5 mcg. Diuresed with lasix drip + metolazone. Overall diuresed 41 pounds.Due to persistently low mixed venous saturation and worsening renal function it was clear there were no additional options. Palliative Care consulted and he elected to deactivate ICD and stop milrinone once transferred to [facility]. PICC line was continued at discharge for comfort measures.   See below for detailed problem list.   1. 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 [place] 2015 with moderate disease.  Deactivate Biotronik.   End stage cardiomyopathy.  - ECHO 10/22/19 EF severely EF 5-10% with severe Spencer/TR, biatrial enlargement, RV with moderately reduced function  -On milrinone 0.5 mcg. Dobutamine 2.5 added 1/22 due to persistently low co-ox (30s-40s). Dobutamine discontinued 1/26 -Plan to stop milrinone today once he is discharging to [facility]. .  - Continue torsemide 100 mg bid.  - No bb with low output. No Arb/spiro/dig with elevated creatinine.  - continue w/ unna boots.   2.Urinary Retention  - He had foley catheter until last week.  - Urine output adequate. On Flomax.   3.CKD Stage IIIb - Recent creatinine baseline 2.5-2.8 - creatinine peaked at 2.7.   4.H/O RLE DVT - Continue eliquis 5 mg twice a day.   5. Hyponatremia - Na dropped up to 126   - likely hypervolemic hyponatremia   6. Hypokalemia - K 3.4  - Supp K   7.DNR/DNI - Discussed code status. He confirms DNR/DNI. He wants to keep ICD on. He had GOLD DNR form with him.  - Transfer to [facility] today.    Discharge Vitals: Blood pressure (!) 81/61, pulse 82, temperature 97.7 F (36.5 C), temperature source Oral, resp. rate 18, height 6\' 1"  (1.854 m), weight 76.3 kg, SpO2 100 %.  Labs: Lab Results  Component Value Date   WBC 5.5 12/29/2019   HGB 9.3 (L) 12/29/2019   HCT 29.9 (L) 12/29/2019   MCV 64.2 (L) 12/29/2019   PLT 173 12/29/2019    Recent Labs  Lab 01/05/20 0425  NA 131*  K 3.4*  CL 82*  CO2 35*  BUN 59*  CREATININE 2.73*  CALCIUM 9.0  GLUCOSE 117*  Lab Results  Component Value Date   CHOL 98 01/06/2019   HDL 46 01/06/2019   LDLCALC 41 01/06/2019   TRIG 57 01/06/2019   BNP (last 3 results) Recent Labs    08/21/19 2013 10/23/19 1048 12/24/19 1657  BNP 2,515.0* 3,200.7* 2,727.7*    ProBNP (last 3 results) No results for input(s): PROBNP in the last 8760 hours.   Diagnostic Studies/Procedures   No results found.  Discharge Medications   Allergies as  of 01/05/2020   No Known Allergies     Medication List    STOP taking these medications   lactobacillus Pack   metolazone 2.5 MG tablet Commonly known as: ZAROXOLYN   milrinone 20 MG/100 ML Soln infusion Commonly known as: PRIMACOR   mometasone-formoterol 200-5 MCG/ACT Aero Commonly known as: Dulera     TAKE these medications   acetaminophen 325 MG tablet Commonly known as: TYLENOL Take 2 tablets (650 mg total) by mouth every 4 (four) hours as needed for headache or mild pain.   alum & mag hydroxide-simeth 200-200-20 MG/5ML suspension Commonly known as: MAALOX/MYLANTA Take 30 mLs by mouth every 2 (two) hours as needed for indigestion.   amiodarone 200 MG tablet Commonly known as: PACERONE TAKE 1 TABLET BY MOUTH EVERY DAY   apixaban 5 MG Tabs tablet Commonly known as: ELIQUIS Take 1 tablet (5 mg total) by mouth 2 (two) times daily.   docusate sodium 100 MG capsule Commonly known as: COLACE Take 100 mg by mouth every other day.   OXYGEN Inhale 2 L into the lungs continuous.   pantoprazole 40 MG tablet Commonly known as: PROTONIX Take 1 tablet (40 mg total) by mouth daily.   polyethylene glycol 17 g packet Commonly known as: MIRALAX / GLYCOLAX Take 17 g by mouth daily. What changed:   when to take this  reasons to take this   potassium chloride SA 20 MEQ tablet Commonly known as: Klor-Con M20 Take 2 tablets (40 mEq total) by mouth 2 (two) times daily. What changed: when to take this   silodosin 8 MG Caps capsule Commonly known as: RAPAFLO Take 8 mg by mouth daily.   sucralfate 1 g tablet Commonly known as: Carafate Take 1 tablet (1 g total) by mouth 4 (four) times daily -  with meals and at bedtime. What changed:   how much to take  when to take this   tamsulosin 0.4 MG Caps capsule Commonly known as: FLOMAX Take 2 capsules (0.8 mg total) by mouth daily after supper.   torsemide 100 MG tablet Commonly known as: DEMADEX Take 1 tablet (100 mg  total) by mouth 2 (two) times daily. What changed:   medication strength  how much to take       Disposition   The patient will be discharged in stable condition to [facility] via PTAR      Duration of Discharge Encounter: Greater than 35 minutes   Signed, Amy Clegg NP-C  01/05/2020, 1:30 PM  Patient seen and examined with the above-signed Advanced Practice Provider and/or Housestaff. I personally reviewed laboratory data, imaging studies and relevant notes. I independently examined the patient and formulated the important aspects of the plan. I have edited the note to reflect any of my changes or salient points. I have personally discussed the plan with the patient and/or family.  He is actively dying from end-stage HF despite milrinone support. For transfer to [facility] today. Stop milrinone and deactivate ICD. Discussed with him and his family  at length.   Glori Bickers, MD  8:58 AM

## 2020-01-05 NOTE — Progress Notes (Signed)
Chaplain engaged in initial visit with Mr. Spencer Rice.  Chaplain and Mr. Spencer Rice prayed together.  Chaplain also assisted Mr. Spencer Rice with making a call on his cell phone.  Mr. Spencer Rice told chaplain to stop by and pray anytime.  Chaplain will continue to follow-up.

## 2020-01-05 NOTE — TOC Transition Note (Signed)
Transition of Care [facility]) - CM/SW Discharge Note   Patient Details  Name: Corbitt Cloke MRN: [id number] Date of Birth: 02/27/1941  [facility]) CM/SW Contact:  Zenon Mayo, RN Phone Number: 01/05/2020, 1:57 PM   Clinical Narrative:    NCM notified son that ambulance has been called, and notified Staff RN.  Ptar called for transport to [facility].   Final next level of care: [place] Barriers to Discharge: No Barriers Identified   Patient Goals and CMS Choice   CMS Medicare.gov Compare Post Acute Care list provided to:: Patient Choice offered to / list presented to : Patient  Discharge Placement              Patient chooses bed at: [facility])   Name of family member notified: [place] Vandergriff Patient and family notified of of transfer: 01/05/20  Discharge Plan and Services     Post Acute Care Choice: Hospice                               Social Determinants of Health ([place]) Interventions     Readmission Risk Interventions Readmission Risk Prevention Plan 10/27/2019  Transportation Screening Complete  Medication Review ([place]) Complete  PCP or Specialist appointment within 3-5 days of discharge Complete  HRI or [place] Complete  SW Recovery Care/Counseling Consult Complete  [place] Not Applicable  Some recent data might be hidden

## 2020-01-05 NOTE — Progress Notes (Signed)
Spoke to Pt's son this AM about contacting Harmon Pier about the [employer] for [employer].  He said he spoke to her this AM and told her he would get back to her this AM.  He said he had not yet, but would. He reassured me that he would call her this AM and get the paperwork completed.  I told him that we would like to get his dad over there, today, and we needed him to complete this.  He advised he would.

## 2020-01-05 NOTE — Progress Notes (Signed)
[facility] room available for [name] today. Son [name] has completed paperwork for transfer today.   Please send discharge summary to [phone] prior to patient leaving the unit.   RN please call report to [phone] prior to patient leaving the unit.   Thank you,  Spencer Conte LCSW [phone]

## 2020-01-05 NOTE — Progress Notes (Signed)
Pt discharged to [facility] via PT[facility] 

## 2020-01-05 NOTE — Progress Notes (Addendum)
Patient ID: Spencer Rice, male   DOB: 08-09-1941, 79 y.o.   MRN: [id number]     Advanced Heart Failure Rounding Note  PCP-Cardiologist: No primary care provider on file.   Subjective:    Remains on milrinone 0.5 mcg. CO-OX 53%.   Yesterday diuresed with torsemide 100/100 mg + metolazone. Negative 1.3 liters. Creatinine trending up 2.3>2.7. Weight unchanged.    Denies SOB. Wants to go to [facility].    Objective:   Weight Range: 76.3 kg Body mass index is 22.19 kg/m.   Vital Signs:   Temp:  [97.6 F (36.4 C)-98.3 F (36.8 C)] 98.3 F (36.8 C) (02/01 0447) Pulse Rate:  [82-83] 83 (02/01 0447) Resp:  [16-21] 16 (02/01 0447) BP: (82-92)/(59-66) 92/59 (02/01 0447) SpO2:  [98 %] 98 % (02/01 0447) Weight:  [76.3 kg] 76.3 kg (02/01 0447) Last BM Date: 01/02/20  Weight change: Filed Weights   01/03/20 0310 01/04/20 0338 01/05/20 0447  Weight: 76 kg 76.3 kg 76.3 kg    Intake/Output:   Intake/Output Summary (Last 24 hours) at 01/05/2020 0805 Last data filed at 01/05/2020 0448 Gross per 24 hour  Intake 117.64 ml  Output 1501 ml  Net -1383.36 ml      Physical Exam  CVP 11-12  General:  Sitting in the chair.  No resp difficulty HEENT: normal Neck: supple. JVP 11-12 . Carotids 2+ bilat; no bruits. No lymphadenopathy or thryomegaly appreciated. Cor: PMI nondisplaced. Regular rate & rhythm. No rubs, gallops or murmurs. Lungs: clear Abdomen: soft, nontender, nondistended. No hepatosplenomegaly. No bruits or masses. Good bowel sounds. Extremities: no cyanosis, clubbing, rash, R and LLE unna boots. edema Neuro: alert & orientedx3, cranial nerves grossly intact. moves all 4 extremities w/o difficulty. Affect pleasant   Telemetry   V Paced 80s    EKG   n/a   Labs    CBC No results for input(s): WBC, NEUTROABS, HGB, HCT, MCV, PLT in the last 72 hours. Basic Metabolic Panel Recent Labs    01/04/20 0430 01/05/20 0425  NA 130* 131*  K 3.5 3.4*  CL 79* 82*  CO2  36* 35*  GLUCOSE 125* 117*  BUN 57* 59*  CREATININE 2.35* 2.73*  CALCIUM 9.1 9.0   Liver Function Tests No results for input(s): AST, ALT, ALKPHOS, BILITOT, PROT, ALBUMIN in the last 72 hours. No results for input(s): LIPASE, AMYLASE in the last 72 hours. Cardiac Enzymes No results for input(s): CKTOTAL, CKMB, CKMBINDEX, TROPONINI in the last 72 hours.  BNP: BNP (last 3 results) Recent Labs    08/21/19 2013 10/23/19 1048 12/24/19 1657  BNP 2,515.0* 3,200.7* 2,727.7*    ProBNP (last 3 results) No results for input(s): PROBNP in the last 8760 hours.   D-Dimer No results for input(s): DDIMER in the last 72 hours. Hemoglobin A1C No results for input(s): HGBA1C in the last 72 hours. Fasting Lipid Panel No results for input(s): CHOL, HDL, LDLCALC, TRIG, CHOLHDL, LDLDIRECT in the last 72 hours. Thyroid Function Tests No results for input(s): TSH, T4TOTAL, T3FREE, THYROIDAB in the last 72 hours.  Invalid input(s): FREET3  Other results:   Imaging    No results found.   Medications:     Scheduled Medications: . amiodarone  200 mg Oral Daily  . apixaban  5 mg Oral BID  . Chlorhexidine Gluconate Cloth  6 each Topical Daily  . docusate sodium  100 mg Oral Daily  . lactobacillus  1 g Oral TID WC  . pantoprazole  40 mg Oral  Daily  . polyethylene glycol  17 g Oral Daily  . potassium chloride  40 mEq Oral BID  . sodium chloride flush  10-40 mL Intracatheter Q12H  . sucralfate  1 g Oral TID WC & HS  . tamsulosin  0.4 mg Oral QPC supper  . torsemide  100 mg Oral BID    Infusions: . milrinone 0.5 mcg/kg/min (01/05/20 0635)    PRN Medications: acetaminophen, alum & mag hydroxide-simeth, calcium carbonate, guaiFENesin-dextromethorphan, hydrocortisone cream, loperamide, magnesium hydroxide, sodium chloride flush     Assessment/Plan   1. A/C Biventricular systolic HF , End Stage on Milrinone 0.5 mcg at home Due to primarily to nonischemic cardiomyopathy (out of  proportion to CAD). [place] 2015 with moderate disease. Deactivate Biotronik.   End stage cardiomyopathy.  - ECHO 10/22/19 EF severely EF 5-10% with severe MR/TR, biatrial enlargement, RV with moderately reduced function  - On milrinone 0.5 mcg. Dobutamine 2.5 added 1/22 due to persistently low co-ox (30s-40s). Dobutamine discontinued 1/26 -Continue milrinone 0.5 mcg/kg/min- CO-OX 53%.  - Continue torsemide 100 mg bid.  - No bb with low output. No Arb/spiro/dig with elevated creatinine.  - continue w/ unna boots.   2. Urinary Retention  - He had foley catheter until last week.  - Urine output adequate. On Flomax.   3. CKD Stage IIIb - Recent creatinine baseline 2.5-2.8  - SCr 2.2>2.4>2.44>2.35 >>2.7   4.  H/O RLE DVT - Continue eliquis 5 mg twice a day.   5. Hyponatremia - Na up to 131 today.  - likely hypervolemic hyponatremia   6. Hypokalemia - K 3.4  - Supp K   7. DNR/DNI - Discussed code status. He confirms DNR/DNI. He wants to keep ICD on. He had GOLD DNR form with him.  - Hospice working on bed in hospice facility.  He can go when bed available.  Would stop milrinone at that time.   - He would like to go to [place]   I personally called Harmon Pier with Hospice for transport to [facility]. Deactivate ICD.   Length of Stay: Bangs, NP  01/05/2020, 8:05 AM  Advanced Heart Failure Team Pager [phone] (M-F; 7a - 4p)  Please contact [facility] for night-coverage after hours (4p -7a ) and weekends on [email or web address]  Patient seen and examined with the above-signed Advanced Practice Provider and/or Housestaff. I personally reviewed laboratory data, imaging studies and relevant notes. I independently examined the patient and formulated the important aspects of the plan. I have edited the note to reflect any of my changes or salient points. I have personally discussed the plan with the patient and/or family.  He is actively dying from end-stage HF despite milrinone support. For  transfer to [facility] today. Stop milrinone and deactivate ICD. Discussed with him and his family at length.   Glori Bickers, MD  8:58 AM

## 2020-01-13 ENCOUNTER — Other Ambulatory Visit (HOSPITAL_COMMUNITY): Payer: Self-pay | Admitting: Internal Medicine

## 2020-01-24 ENCOUNTER — Other Ambulatory Visit (HOSPITAL_COMMUNITY): Payer: Self-pay | Admitting: Internal Medicine

## 2020-02-02 DEATH — deceased

## 2020-07-23 IMAGING — DX DG CHEST 2V
2 series · 2 of 2 positions shown · non-contrast
Comparison: 11/22/2018 and earlier.

CLINICAL DATA: 77-year-old with oxygen dependent CHF, presenting
with BILATERAL LOWER extremity swelling and acute onset of shortness
of breath.

EXAM:
CHEST - 2 VIEW

[chest lat]
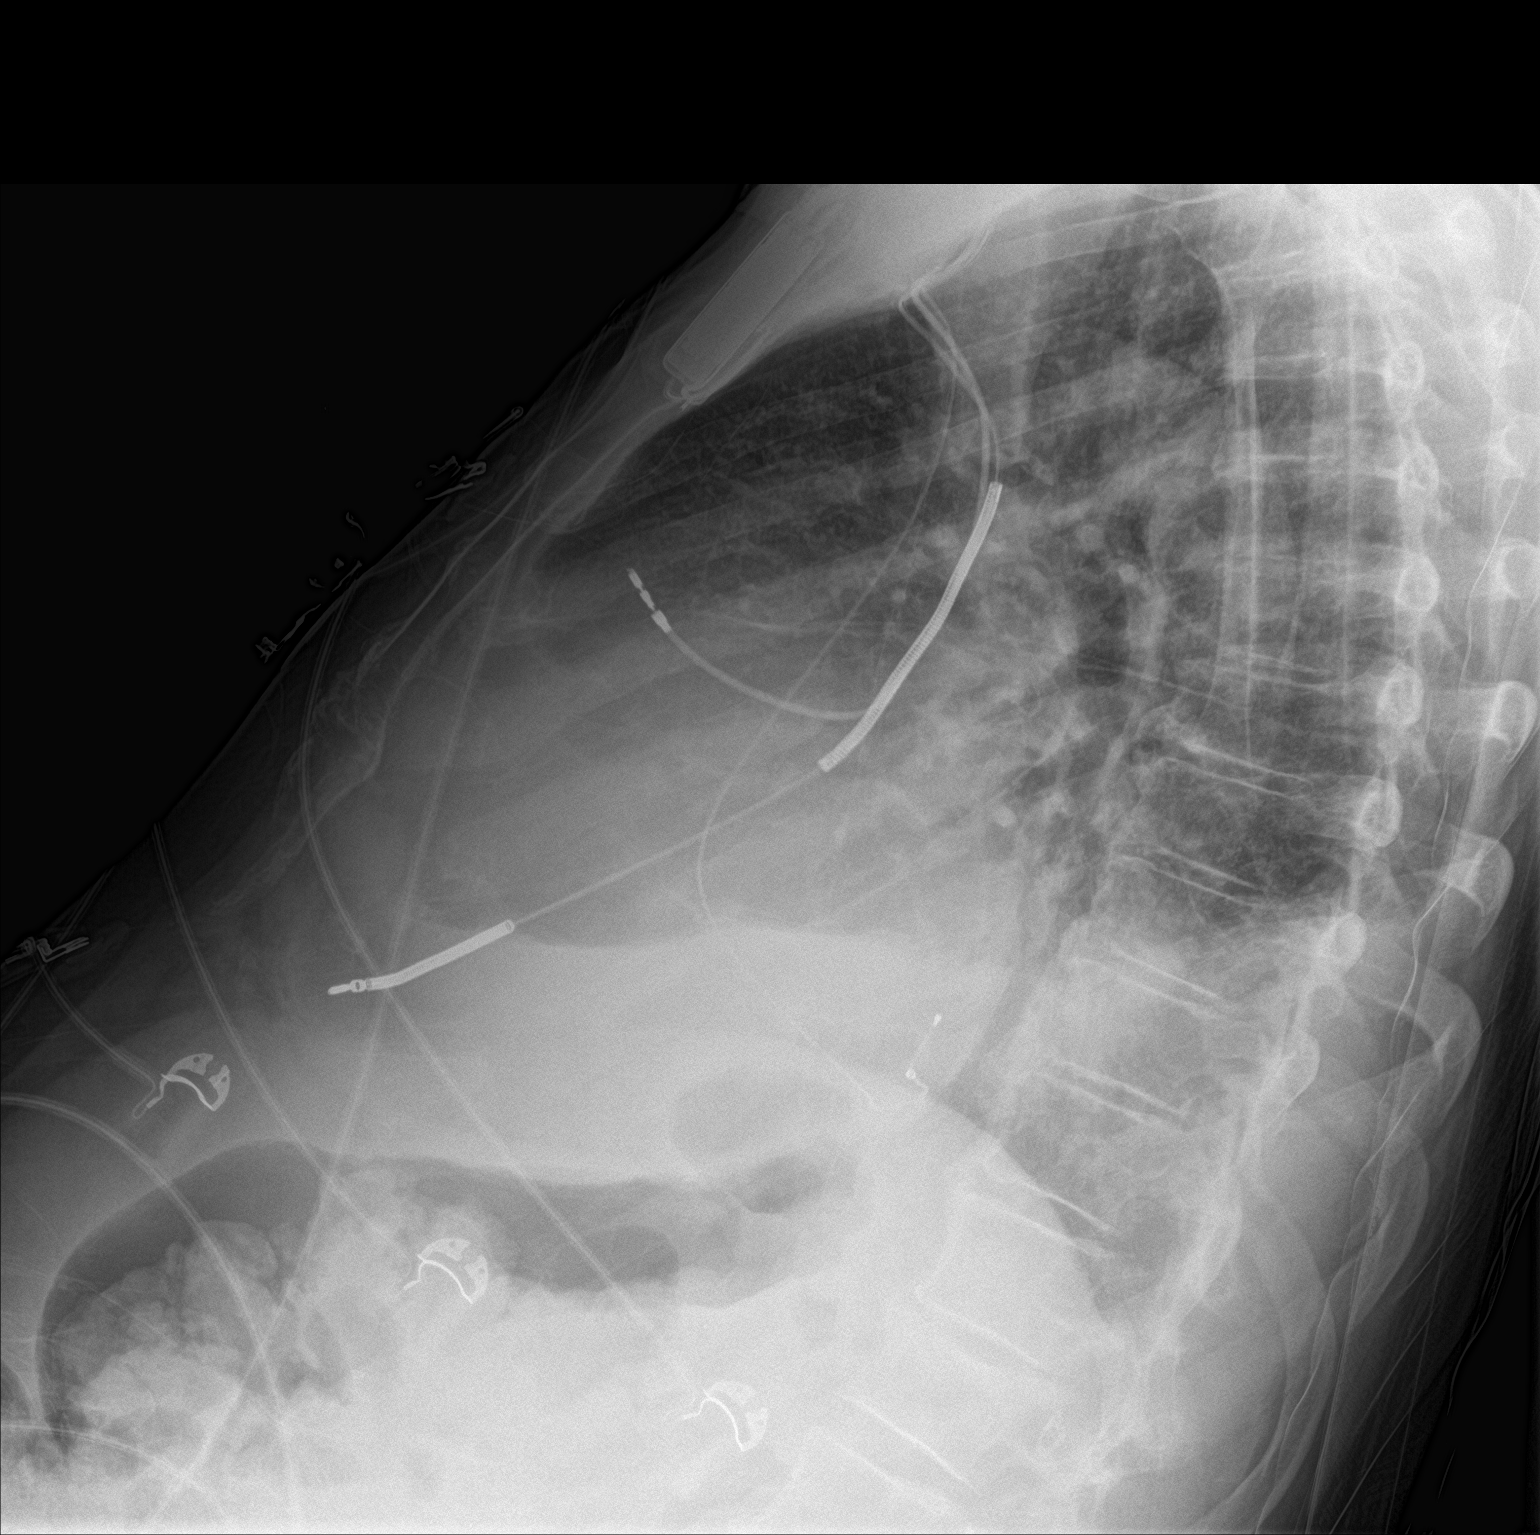

[chest ap strecther]
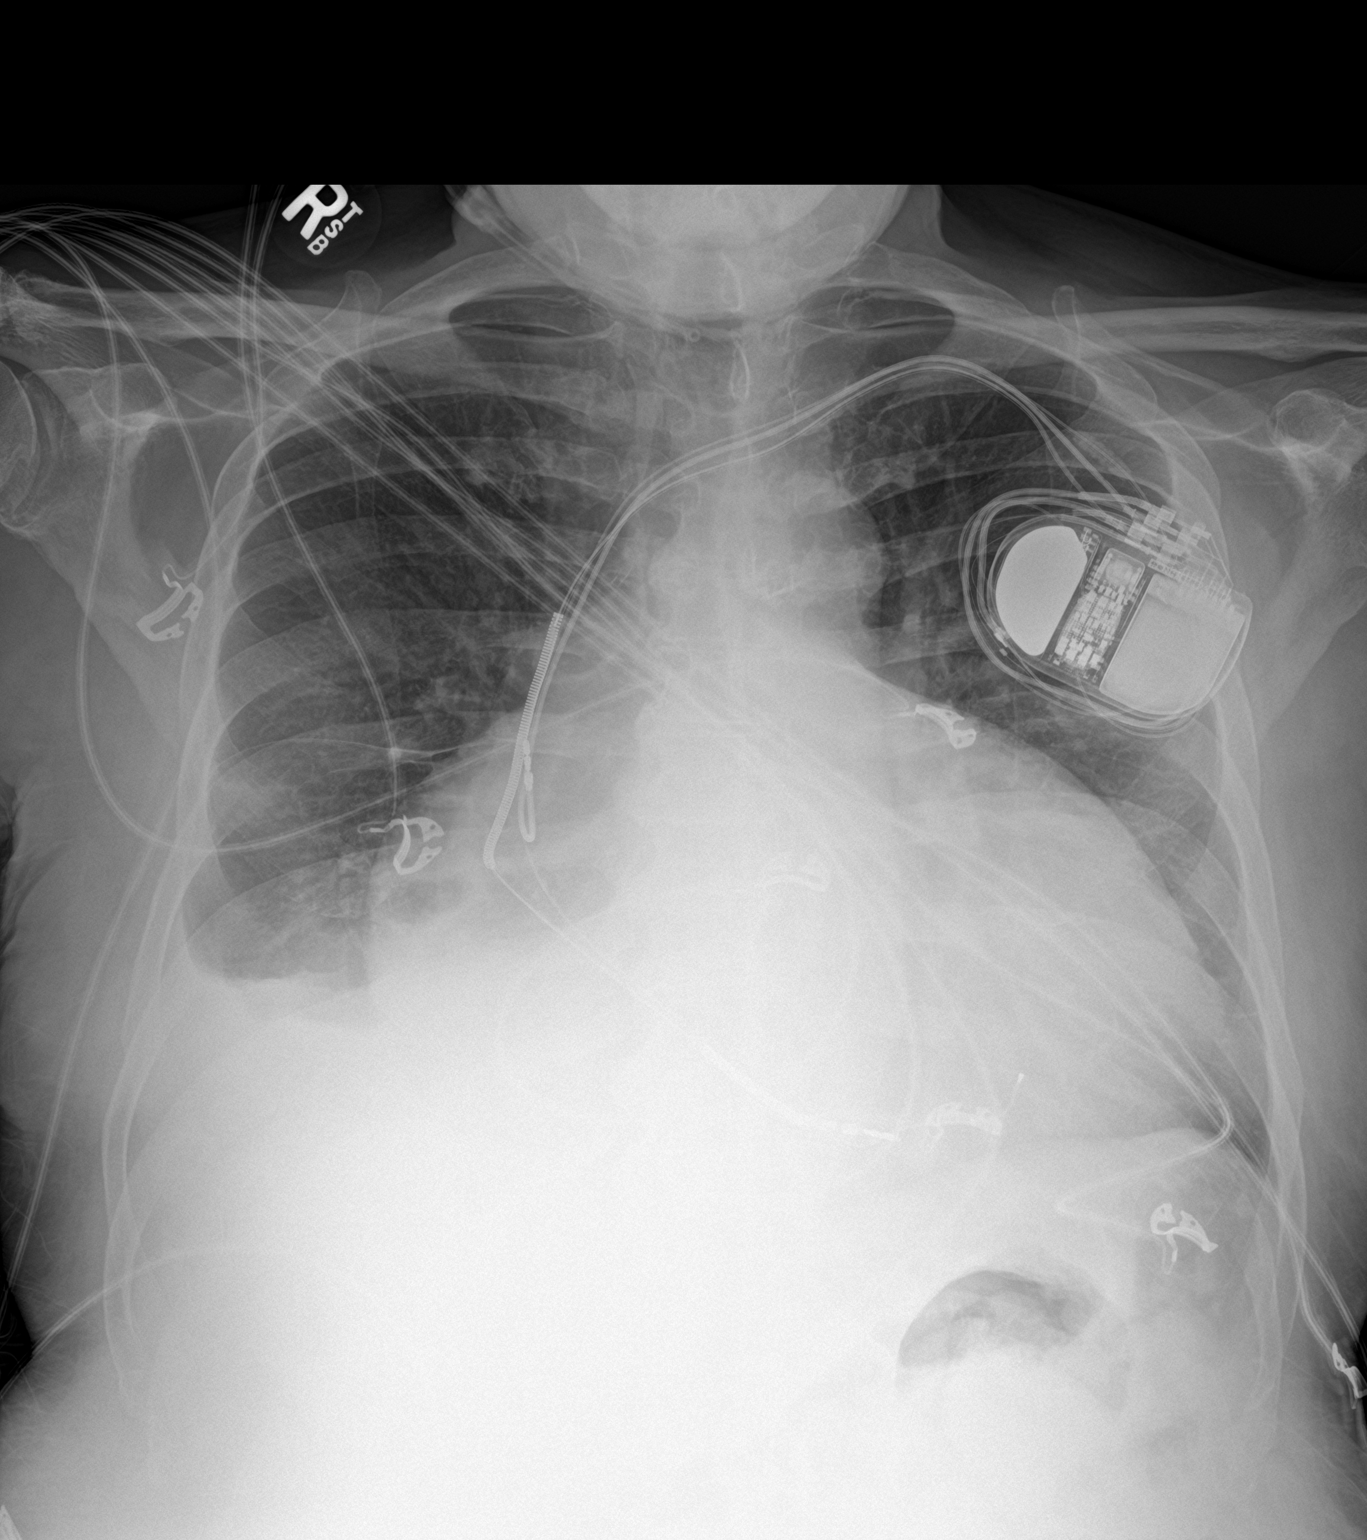

[2 of 2 positions shown; findings below may reference images not displayed]

FINDINGS: Cardiac silhouette massively enlarged, unchanged. LEFT subclavian
biventricular pacing defibrillator unchanged. Thoracic aorta mildly
atherosclerotic, unchanged. Hilar and mediastinal contours otherwise
unremarkable.

Chronic RIGHT pleural effusion and associated chronic RIGHT LOWER
LOBE atelectasis, unchanged dating back 05/03/2008. Lungs otherwise
clear. Mild pulmonary venous hypertension without overt edema. No
LEFT pleural effusion. Degenerative changes and DISH involving the
thoracic spine.
IMPRESSION: Stable massive cardiomegaly without pulmonary edema. Stable chronic
RIGHT pleural effusion and associated chronic RIGHT LOWER LOBE
atelectasis. No new/acute cardiopulmonary disease.

## 2020-08-25 IMAGING — CT CT CHEST W/O CM
2 of 4 series · 13 of 46 positions shown, 15 images · non-contrast
Comparison: CT the abdomen and pelvis 06/17/2018.

CLINICAL DATA: 77-year-old male under evaluation for potential
candidacy for left ventricular assist device (LVAD).

EXAM:
CT CHEST, ABDOMEN AND PELVIS WITHOUT CONTRAST
TECHNIQUE: Multidetector CT imaging of the chest, abdomen and pelvis was
performed following the standard protocol without IV contrast.

[Series 3: cap without · axial · non-contrast · 0.67mm/px · z∈[+908,+1438]mm · 10 of 126 slices shown, 12 images]
[im 10/126  soft-tissue]
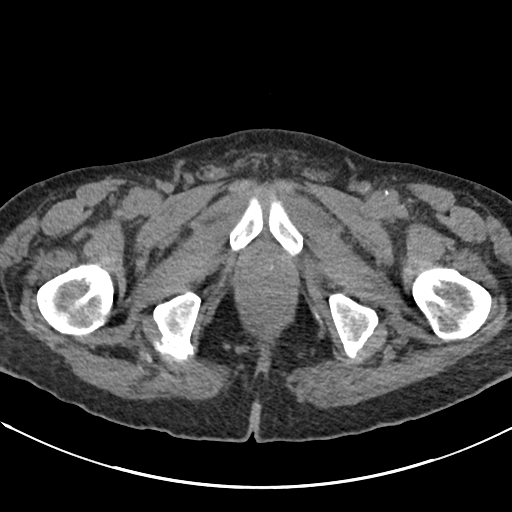
[im 10/126  bone]
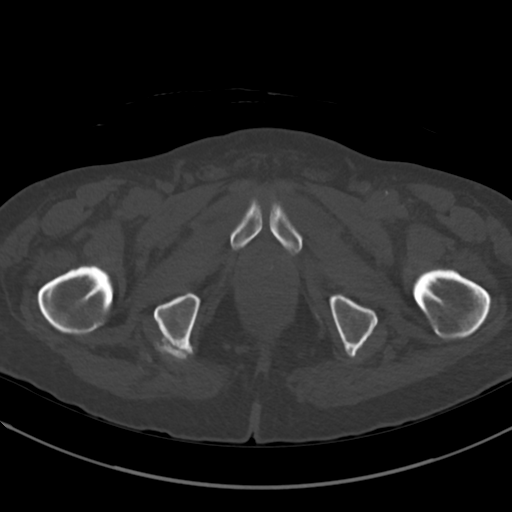
[im 20/126  soft-tissue]
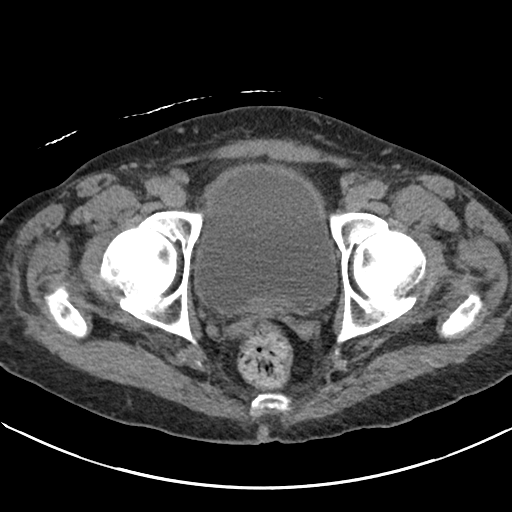
[im 39/126  soft-tissue]
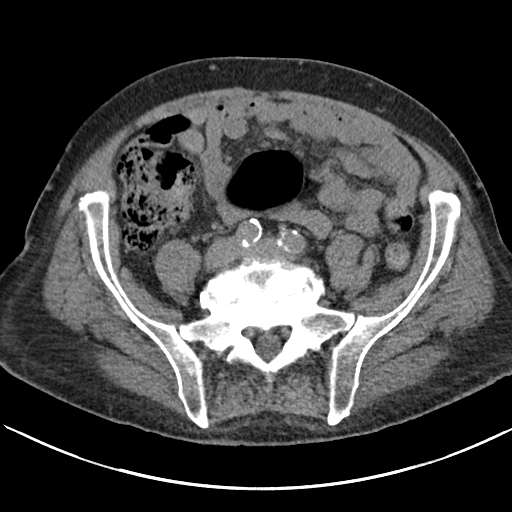
[im 49/126  soft-tissue]
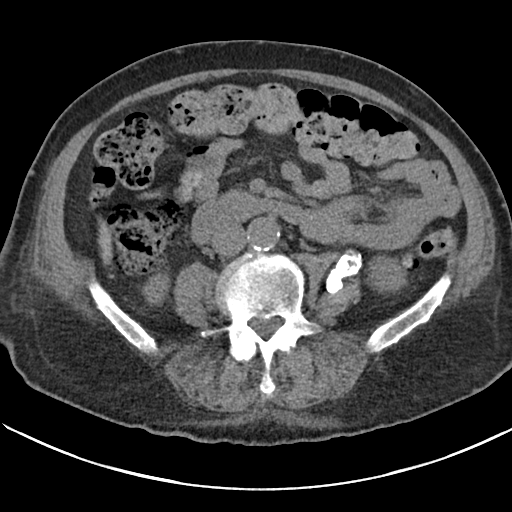
[im 58/126  soft-tissue]
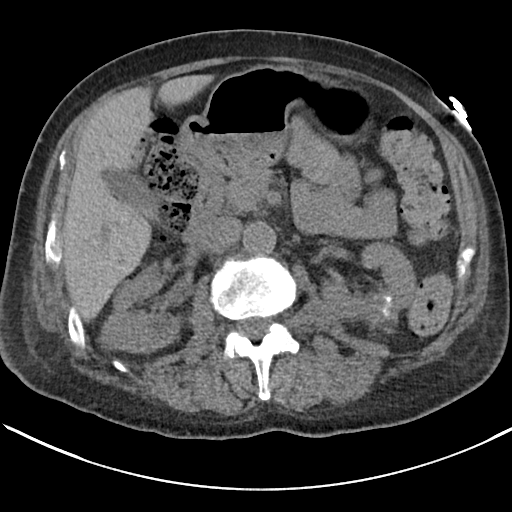
[im 68/126  soft-tissue]
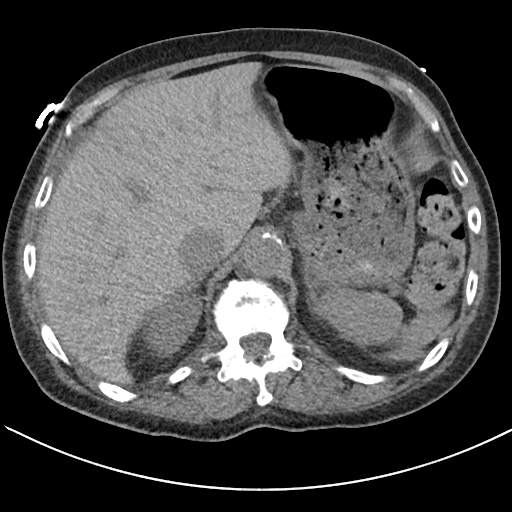
[im 77/126  soft-tissue]
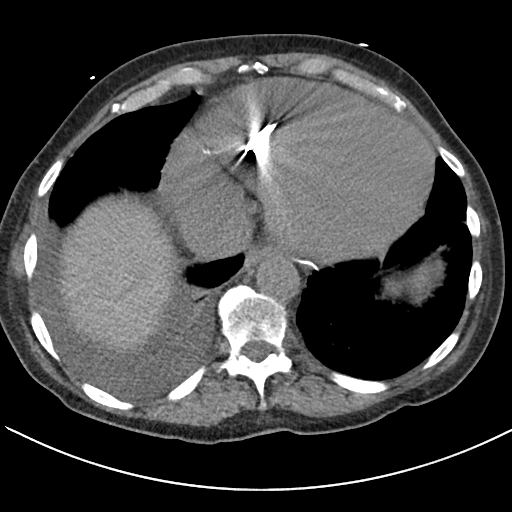
[im 97/126  soft-tissue]
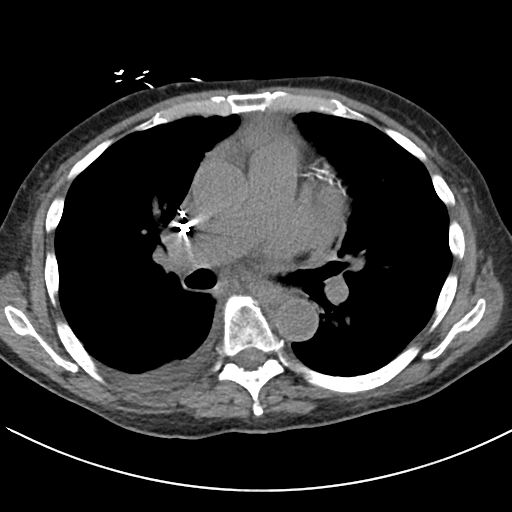
[im 106/126  soft-tissue]
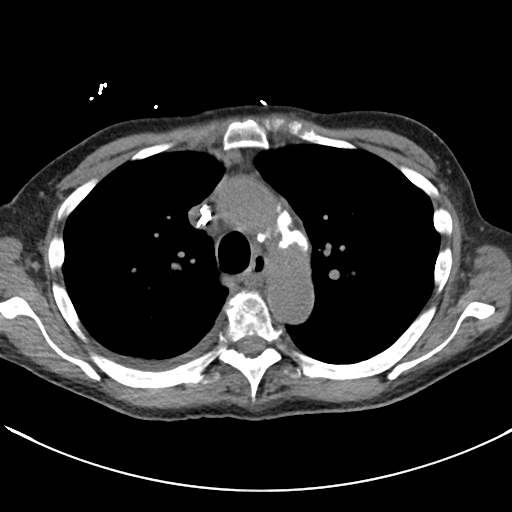
[im 106/126  bone]
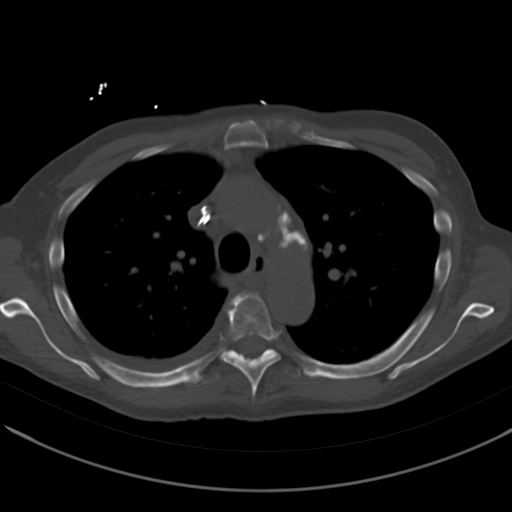
[im 116/126  soft-tissue]
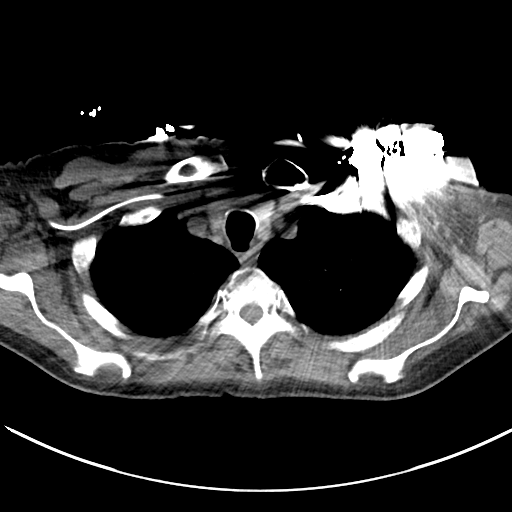

[Series 6: cor · coronal · 0.79mm/px · 3 of 101 slices shown]
[im 34/101  soft-tissue]
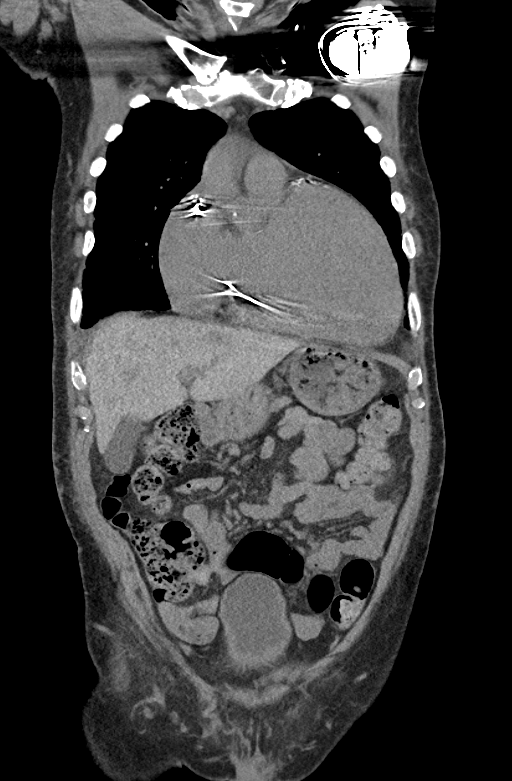
[im 45/101  soft-tissue]
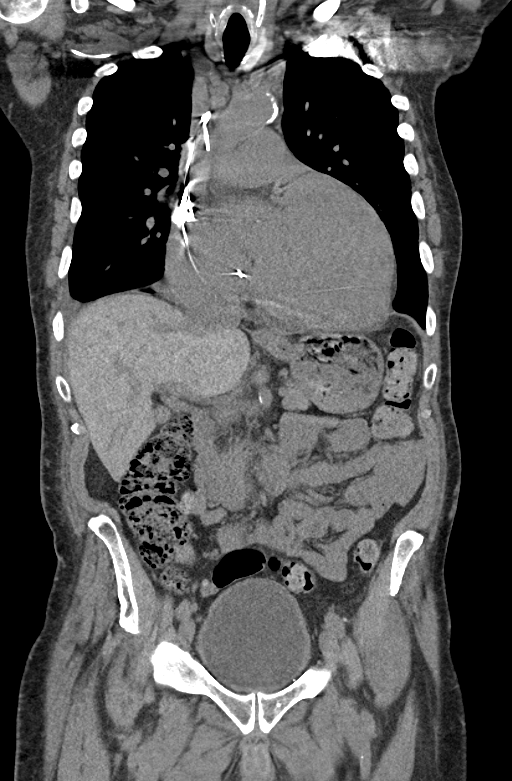
[im 56/101  soft-tissue]
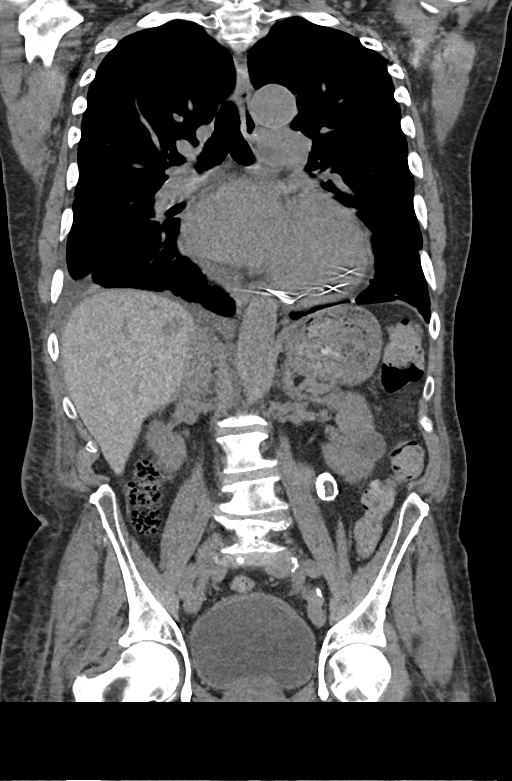

[13 of 46 positions shown; findings below may reference images not displayed]

FINDINGS: CT CHEST FINDINGS

Cardiovascular: Heart size is mildly enlarged. Left ventricular apex
is immediately deep to the interspace between the anterolateral
aspects of the left sixth and seventh ribs. There is no significant
pericardial fluid, thickening or pericardial calcification. There is
aortic atherosclerosis, as well as atherosclerosis of the great
vessels of the mediastinum and the coronary arteries, including
calcified atherosclerotic plaque in the left anterior descending,
left circumflex and right coronary arteries. Calcification of the
ascending thoracic aorta is very minimal. Right upper extremity PICC
with tip terminating in the mid superior vena cava. Left-sided
biventricular pacemaker/AICD in place with lead tips terminating in
the right atrial appendage, right ventricular apex and overlying the
lateral wall the left ventricle via the coronary sinus and coronary
veins.

Mediastinum/Nodes: No pathologically enlarged mediastinal or hilar
lymph nodes. Please note that accurate exclusion of hilar adenopathy
is limited on noncontrast CT scans. Esophagus is unremarkable in
appearance. No axillary lymphadenopathy.

Lungs/Pleura: Mild ground-glass attenuation and interlobular septal
thickening noted in the mid to lower lungs bilaterally, favored to
reflect a background of mild interstitial pulmonary edema. Small
right pleural effusion lying dependently with areas of passive
subsegmental atelectasis in the right lower lobe. No acute
consolidative airspace disease. 6 mm nodule in the left upper lobe
(axial image 32 of series 5).

Musculoskeletal: There are no aggressive appearing lytic or blastic
lesions noted in the visualized portions of the skeleton.

CT ABDOMEN PELVIS FINDINGS

Hepatobiliary: No definite suspicious cystic or solid hepatic
lesions are confidently identified on today's noncontrast CT
examination. Unenhanced appearance of the gallbladder is normal.

Pancreas: No definite pancreatic mass or peripancreatic fluid or
inflammatory changes are noted on today's noncontrast CT
examination.

Spleen: Unremarkable.

Adrenals/Urinary Tract: Low-attenuation lesions in the left kidney
measuring up to 2.4 cm in the lower pole, previously characterized
as simple cysts. In the posterior aspect of the interpolar region of
the left kidney there is again a focal area of architectural
distortion which is partially calcified and contain some internal
fatty attenuation, most compatible with a post ablation defect. This
is poorly evaluated on today's noncontrast CT examination. Right
kidney and bilateral adrenal glands are normal in appearance. No
hydroureteronephrosis. Urinary bladder is normal in appearance.

Stomach/Bowel: Normal appearance of the stomach. No pathologic
dilatation of small bowel or colon. Normal appendix.

Vascular/Lymphatic: Aortic atherosclerosis. No lymphadenopathy noted
in the abdomen or pelvis.

Reproductive: Prostate gland and seminal vesicles are unremarkable
in appearance.

Other: No significant volume of ascites.  No pneumoperitoneum.

Musculoskeletal: Subacute compression fracture of anterior aspect of
superior endplate of L1 with 20% loss of anterior vertebral body
height. There are no aggressive appearing lytic or blastic lesions
noted in the visualized portions of the skeleton.
IMPRESSION: 1. No imaging findings to suggest impediment to left ventricular
assist device placement. Specifically, the degree of calcification
of the ascending thoracic aorta is minimal, and there is no physical
obstruction in the left upper quadrant of the abdomen.
2. Cardiomegaly with evidence of mild interstitial pulmonary edema
and small right pleural effusions; imaging findings suggestive of
congestive heart failure.
3. 6 mm left upper lobe pulmonary nodule. Non-contrast chest CT at
6-12 months is recommended. If the nodule is stable at time of
repeat CT, then future CT at 18-24 months (from today's scan) is
considered optional for low-risk patients, but is recommended for
high-risk patients. This recommendation follows the consensus
statement: Guidelines for Management of Incidental Pulmonary Nodules
Detected on CT Images: From the [HOSPITAL] 2398; Radiology
2398; [DATE].
4. Subacute compression fracture of L1 vertebral body with 20% loss
of anterior vertebral body height.
5. Additional incidental findings, as above.
